# Patient Record
Sex: Female | Born: 1960
Health system: Southern US, Community
[De-identification: ages and names within clinical notes are randomized; demographics above are authoritative.]

## PROBLEM LIST (undated history)

## (undated) DIAGNOSIS — F329 Major depressive disorder, single episode, unspecified: Secondary | ICD-10-CM

## (undated) DIAGNOSIS — G4730 Sleep apnea, unspecified: Secondary | ICD-10-CM

## (undated) DIAGNOSIS — L03119 Cellulitis of unspecified part of limb: Secondary | ICD-10-CM

## (undated) DIAGNOSIS — E785 Hyperlipidemia, unspecified: Secondary | ICD-10-CM

## (undated) DIAGNOSIS — IMO0001 Reserved for inherently not codable concepts without codable children: Secondary | ICD-10-CM

## (undated) DIAGNOSIS — I10 Essential (primary) hypertension: Secondary | ICD-10-CM

## (undated) DIAGNOSIS — G43909 Migraine, unspecified, not intractable, without status migrainosus: Secondary | ICD-10-CM

## (undated) DIAGNOSIS — Z9981 Dependence on supplemental oxygen: Secondary | ICD-10-CM

## (undated) DIAGNOSIS — E119 Type 2 diabetes mellitus without complications: Secondary | ICD-10-CM

## (undated) DIAGNOSIS — M545 Low back pain, unspecified: Secondary | ICD-10-CM

## (undated) DIAGNOSIS — M797 Fibromyalgia: Secondary | ICD-10-CM

## (undated) DIAGNOSIS — J449 Chronic obstructive pulmonary disease, unspecified: Secondary | ICD-10-CM

## (undated) DIAGNOSIS — G629 Polyneuropathy, unspecified: Secondary | ICD-10-CM

## (undated) DIAGNOSIS — I509 Heart failure, unspecified: Secondary | ICD-10-CM

## (undated) DIAGNOSIS — R609 Edema, unspecified: Secondary | ICD-10-CM

## (undated) DIAGNOSIS — R51 Headache: Secondary | ICD-10-CM

## (undated) DIAGNOSIS — M1990 Unspecified osteoarthritis, unspecified site: Secondary | ICD-10-CM

## (undated) DIAGNOSIS — R519 Headache, unspecified: Secondary | ICD-10-CM

## (undated) DIAGNOSIS — R600 Localized edema: Secondary | ICD-10-CM

## (undated) DIAGNOSIS — A419 Sepsis, unspecified organism: Secondary | ICD-10-CM

## (undated) DIAGNOSIS — F32A Depression, unspecified: Secondary | ICD-10-CM

## (undated) DIAGNOSIS — G8929 Other chronic pain: Secondary | ICD-10-CM

## (undated) DIAGNOSIS — L89899 Pressure ulcer of other site, unspecified stage: Secondary | ICD-10-CM

## (undated) DIAGNOSIS — L02419 Cutaneous abscess of limb, unspecified: Secondary | ICD-10-CM

## (undated) DIAGNOSIS — R413 Other amnesia: Secondary | ICD-10-CM

## (undated) HISTORY — DX: Essential (primary) hypertension: I10

## (undated) HISTORY — DX: Other amnesia: R41.3

## (undated) HISTORY — DX: Pressure ulcer of other site, unspecified stage: L89.899

## (undated) HISTORY — DX: Edema, unspecified: R60.9

## (undated) HISTORY — DX: Localized edema: R60.0

## (undated) HISTORY — PX: HERNIA REPAIR: SHX51

## (undated) HISTORY — DX: Polyneuropathy, unspecified: G62.9

## (undated) HISTORY — DX: Morbid (severe) obesity due to excess calories: E66.01

## (undated) HISTORY — DX: Unspecified osteoarthritis, unspecified site: M19.90

## (undated) HISTORY — DX: Reserved for inherently not codable concepts without codable children: IMO0001

---

## 1992-12-16 HISTORY — PX: TOTAL ABDOMINAL HYSTERECTOMY: SHX209

## 1993-12-16 HISTORY — PX: APPENDECTOMY: SHX54

## 1993-12-16 HISTORY — PX: CHOLECYSTECTOMY OPEN: SUR202

## 2000-12-16 HISTORY — PX: ABDOMINAL HERNIA REPAIR: SHX539

## 2001-02-17 ENCOUNTER — Encounter: Payer: Self-pay | Admitting: Emergency Medicine

## 2001-02-17 ENCOUNTER — Emergency Department (HOSPITAL_COMMUNITY): Admission: EM | Admit: 2001-02-17 | Discharge: 2001-02-17 | Payer: Self-pay | Admitting: Emergency Medicine

## 2001-03-06 ENCOUNTER — Ambulatory Visit (HOSPITAL_COMMUNITY): Admission: RE | Admit: 2001-03-06 | Discharge: 2001-03-06 | Payer: Self-pay | Admitting: Emergency Medicine

## 2001-03-06 ENCOUNTER — Encounter: Payer: Self-pay | Admitting: Emergency Medicine

## 2001-03-26 ENCOUNTER — Encounter: Payer: Self-pay | Admitting: Surgery

## 2001-03-26 ENCOUNTER — Ambulatory Visit (HOSPITAL_COMMUNITY): Admission: RE | Admit: 2001-03-26 | Discharge: 2001-03-26 | Payer: Self-pay | Admitting: Surgery

## 2001-07-08 ENCOUNTER — Encounter: Payer: Self-pay | Admitting: Surgery

## 2001-07-14 ENCOUNTER — Inpatient Hospital Stay (HOSPITAL_COMMUNITY): Admission: RE | Admit: 2001-07-14 | Discharge: 2001-07-16 | Payer: Self-pay | Admitting: Surgery

## 2001-09-01 ENCOUNTER — Ambulatory Visit (HOSPITAL_COMMUNITY): Admission: RE | Admit: 2001-09-01 | Discharge: 2001-09-01 | Payer: Self-pay | Admitting: Surgery

## 2001-09-01 ENCOUNTER — Encounter: Payer: Self-pay | Admitting: Surgery

## 2001-10-20 ENCOUNTER — Encounter: Payer: Self-pay | Admitting: Emergency Medicine

## 2001-10-20 ENCOUNTER — Emergency Department (HOSPITAL_COMMUNITY): Admission: EM | Admit: 2001-10-20 | Discharge: 2001-10-20 | Payer: Self-pay | Admitting: Emergency Medicine

## 2001-11-23 ENCOUNTER — Encounter: Admission: RE | Admit: 2001-11-23 | Discharge: 2002-01-21 | Payer: Self-pay | Admitting: Sports Medicine

## 2001-12-17 ENCOUNTER — Encounter: Admission: RE | Admit: 2001-12-17 | Discharge: 2001-12-17 | Payer: Self-pay | Admitting: Family Medicine

## 2002-01-21 ENCOUNTER — Encounter: Admission: RE | Admit: 2002-01-21 | Discharge: 2002-01-21 | Payer: Self-pay | Admitting: Family Medicine

## 2002-01-22 ENCOUNTER — Encounter: Admission: RE | Admit: 2002-01-22 | Discharge: 2002-01-22 | Payer: Self-pay | Admitting: Family Medicine

## 2002-01-27 ENCOUNTER — Encounter: Admission: RE | Admit: 2002-01-27 | Discharge: 2002-01-27 | Payer: Self-pay | Admitting: Family Medicine

## 2002-02-09 ENCOUNTER — Encounter: Admission: RE | Admit: 2002-02-09 | Discharge: 2002-05-10 | Payer: Self-pay | Admitting: *Deleted

## 2002-02-24 ENCOUNTER — Other Ambulatory Visit: Admission: RE | Admit: 2002-02-24 | Discharge: 2002-02-24 | Payer: Self-pay | Admitting: Family Medicine

## 2002-02-26 ENCOUNTER — Other Ambulatory Visit: Admission: RE | Admit: 2002-02-26 | Discharge: 2002-02-26 | Payer: Self-pay | Admitting: Family Medicine

## 2002-02-26 ENCOUNTER — Encounter: Admission: RE | Admit: 2002-02-26 | Discharge: 2002-02-26 | Payer: Self-pay | Admitting: Family Medicine

## 2002-02-26 ENCOUNTER — Encounter (INDEPENDENT_AMBULATORY_CARE_PROVIDER_SITE_OTHER): Payer: Self-pay | Admitting: Specialist

## 2002-04-01 ENCOUNTER — Other Ambulatory Visit: Admission: RE | Admit: 2002-04-01 | Discharge: 2002-04-01 | Payer: Self-pay | Admitting: Family Medicine

## 2002-04-01 ENCOUNTER — Encounter (INDEPENDENT_AMBULATORY_CARE_PROVIDER_SITE_OTHER): Payer: Self-pay | Admitting: *Deleted

## 2002-04-01 ENCOUNTER — Encounter: Admission: RE | Admit: 2002-04-01 | Discharge: 2002-04-01 | Payer: Self-pay | Admitting: Sports Medicine

## 2002-04-15 ENCOUNTER — Ambulatory Visit (HOSPITAL_COMMUNITY): Admission: RE | Admit: 2002-04-15 | Discharge: 2002-04-15 | Payer: Self-pay | Admitting: Family Medicine

## 2002-04-15 ENCOUNTER — Encounter: Admission: RE | Admit: 2002-04-15 | Discharge: 2002-04-15 | Payer: Self-pay | Admitting: Family Medicine

## 2002-04-27 ENCOUNTER — Encounter: Admission: RE | Admit: 2002-04-27 | Discharge: 2002-04-27 | Payer: Self-pay | Admitting: Family Medicine

## 2002-05-12 ENCOUNTER — Ambulatory Visit (HOSPITAL_COMMUNITY): Admission: RE | Admit: 2002-05-12 | Discharge: 2002-05-12 | Payer: Self-pay | Admitting: Obstetrics & Gynecology

## 2002-05-12 ENCOUNTER — Encounter (INDEPENDENT_AMBULATORY_CARE_PROVIDER_SITE_OTHER): Payer: Self-pay

## 2002-05-26 ENCOUNTER — Encounter: Admission: RE | Admit: 2002-05-26 | Discharge: 2002-05-26 | Payer: Self-pay | Admitting: Family Medicine

## 2002-06-29 ENCOUNTER — Encounter: Admission: RE | Admit: 2002-06-29 | Discharge: 2002-06-29 | Payer: Self-pay | Admitting: Family Medicine

## 2002-08-04 ENCOUNTER — Encounter: Admission: RE | Admit: 2002-08-04 | Discharge: 2002-08-04 | Payer: Self-pay | Admitting: Family Medicine

## 2002-08-23 ENCOUNTER — Encounter: Admission: RE | Admit: 2002-08-23 | Discharge: 2002-09-14 | Payer: Self-pay

## 2002-08-26 ENCOUNTER — Other Ambulatory Visit: Admission: RE | Admit: 2002-08-26 | Discharge: 2002-08-26 | Payer: Self-pay | Admitting: Obstetrics & Gynecology

## 2002-09-01 ENCOUNTER — Encounter: Admission: RE | Admit: 2002-09-01 | Discharge: 2002-09-01 | Payer: Self-pay | Admitting: Family Medicine

## 2002-09-21 ENCOUNTER — Encounter: Admission: RE | Admit: 2002-09-21 | Discharge: 2002-12-20 | Payer: Self-pay

## 2002-10-01 ENCOUNTER — Encounter: Admission: RE | Admit: 2002-10-01 | Discharge: 2002-10-01 | Payer: Self-pay | Admitting: Family Medicine

## 2002-10-08 ENCOUNTER — Encounter: Admission: RE | Admit: 2002-10-08 | Discharge: 2002-10-08 | Payer: Self-pay | Admitting: Family Medicine

## 2002-10-11 ENCOUNTER — Encounter: Admission: RE | Admit: 2002-10-11 | Discharge: 2003-01-09 | Payer: Self-pay

## 2002-10-18 ENCOUNTER — Encounter: Admission: RE | Admit: 2002-10-18 | Discharge: 2002-10-18 | Payer: Self-pay | Admitting: Family Medicine

## 2002-10-23 ENCOUNTER — Emergency Department (HOSPITAL_COMMUNITY): Admission: EM | Admit: 2002-10-23 | Discharge: 2002-10-24 | Payer: Self-pay | Admitting: Emergency Medicine

## 2002-10-24 ENCOUNTER — Encounter: Payer: Self-pay | Admitting: Emergency Medicine

## 2002-11-01 ENCOUNTER — Encounter: Admission: RE | Admit: 2002-11-01 | Discharge: 2002-11-01 | Payer: Self-pay | Admitting: Family Medicine

## 2002-11-30 ENCOUNTER — Encounter: Admission: RE | Admit: 2002-11-30 | Discharge: 2002-11-30 | Payer: Self-pay | Admitting: Family Medicine

## 2002-12-23 ENCOUNTER — Encounter: Admission: RE | Admit: 2002-12-23 | Discharge: 2003-03-23 | Payer: Self-pay

## 2003-01-05 ENCOUNTER — Encounter: Admission: RE | Admit: 2003-01-05 | Discharge: 2003-01-05 | Payer: Self-pay | Admitting: Family Medicine

## 2003-02-03 ENCOUNTER — Encounter: Admission: RE | Admit: 2003-02-03 | Discharge: 2003-02-03 | Payer: Self-pay | Admitting: Family Medicine

## 2003-03-21 ENCOUNTER — Encounter: Admission: RE | Admit: 2003-03-21 | Discharge: 2003-06-19 | Payer: Self-pay

## 2003-04-11 ENCOUNTER — Encounter: Admission: RE | Admit: 2003-04-11 | Discharge: 2003-04-11 | Payer: Self-pay | Admitting: Family Medicine

## 2003-05-13 ENCOUNTER — Encounter: Admission: RE | Admit: 2003-05-13 | Discharge: 2003-05-13 | Payer: Self-pay | Admitting: Family Medicine

## 2003-05-23 ENCOUNTER — Ambulatory Visit (HOSPITAL_COMMUNITY): Admission: RE | Admit: 2003-05-23 | Discharge: 2003-05-23 | Payer: Self-pay | Admitting: Family Medicine

## 2003-05-23 ENCOUNTER — Encounter: Payer: Self-pay | Admitting: Family Medicine

## 2003-06-07 ENCOUNTER — Encounter: Admission: RE | Admit: 2003-06-07 | Discharge: 2003-06-07 | Payer: Self-pay | Admitting: Sports Medicine

## 2003-06-27 ENCOUNTER — Encounter
Admission: RE | Admit: 2003-06-27 | Discharge: 2003-09-25 | Payer: Self-pay | Admitting: Physical Medicine & Rehabilitation

## 2003-07-08 ENCOUNTER — Encounter: Admission: RE | Admit: 2003-07-08 | Discharge: 2003-07-08 | Payer: Self-pay | Admitting: Family Medicine

## 2003-08-25 ENCOUNTER — Encounter: Admission: RE | Admit: 2003-08-25 | Discharge: 2003-08-25 | Payer: Self-pay | Admitting: Family Medicine

## 2003-09-27 ENCOUNTER — Encounter: Admission: RE | Admit: 2003-09-27 | Discharge: 2003-09-27 | Payer: Self-pay | Admitting: Sports Medicine

## 2003-11-16 ENCOUNTER — Encounter (INDEPENDENT_AMBULATORY_CARE_PROVIDER_SITE_OTHER): Payer: Self-pay | Admitting: *Deleted

## 2003-11-18 ENCOUNTER — Encounter
Admission: RE | Admit: 2003-11-18 | Discharge: 2004-02-16 | Payer: Self-pay | Admitting: Physical Medicine & Rehabilitation

## 2003-12-08 ENCOUNTER — Encounter (INDEPENDENT_AMBULATORY_CARE_PROVIDER_SITE_OTHER): Payer: Self-pay | Admitting: Specialist

## 2003-12-08 ENCOUNTER — Encounter: Admission: RE | Admit: 2003-12-08 | Discharge: 2003-12-08 | Payer: Self-pay | Admitting: Sports Medicine

## 2004-01-17 ENCOUNTER — Encounter: Admission: RE | Admit: 2004-01-17 | Discharge: 2004-01-17 | Payer: Self-pay | Admitting: Orthopedic Surgery

## 2004-01-20 ENCOUNTER — Encounter: Admission: RE | Admit: 2004-01-20 | Discharge: 2004-01-20 | Payer: Self-pay | Admitting: Sports Medicine

## 2004-02-09 ENCOUNTER — Encounter: Admission: RE | Admit: 2004-02-09 | Discharge: 2004-02-09 | Payer: Self-pay | Admitting: Family Medicine

## 2004-02-20 ENCOUNTER — Encounter
Admission: RE | Admit: 2004-02-20 | Discharge: 2004-05-20 | Payer: Self-pay | Admitting: Physical Medicine & Rehabilitation

## 2004-04-10 ENCOUNTER — Encounter: Admission: RE | Admit: 2004-04-10 | Discharge: 2004-04-10 | Payer: Self-pay | Admitting: Sports Medicine

## 2004-06-04 ENCOUNTER — Encounter: Admission: RE | Admit: 2004-06-04 | Discharge: 2004-06-04 | Payer: Self-pay | Admitting: Family Medicine

## 2004-08-09 ENCOUNTER — Encounter: Admission: RE | Admit: 2004-08-09 | Discharge: 2004-08-09 | Payer: Self-pay | Admitting: Family Medicine

## 2004-11-07 ENCOUNTER — Ambulatory Visit: Payer: Self-pay | Admitting: Family Medicine

## 2005-01-08 ENCOUNTER — Ambulatory Visit: Payer: Self-pay | Admitting: Family Medicine

## 2005-02-28 ENCOUNTER — Encounter: Admission: RE | Admit: 2005-02-28 | Discharge: 2005-02-28 | Payer: Self-pay | Admitting: Sports Medicine

## 2005-03-12 ENCOUNTER — Ambulatory Visit: Payer: Self-pay | Admitting: Family Medicine

## 2005-05-06 ENCOUNTER — Ambulatory Visit: Payer: Self-pay | Admitting: Family Medicine

## 2005-05-30 DIAGNOSIS — IMO0001 Reserved for inherently not codable concepts without codable children: Secondary | ICD-10-CM

## 2005-05-30 HISTORY — DX: Reserved for inherently not codable concepts without codable children: IMO0001

## 2005-07-05 ENCOUNTER — Ambulatory Visit: Payer: Self-pay | Admitting: Family Medicine

## 2005-08-08 ENCOUNTER — Ambulatory Visit: Payer: Self-pay | Admitting: Family Medicine

## 2005-09-12 ENCOUNTER — Ambulatory Visit: Payer: Self-pay | Admitting: Family Medicine

## 2006-01-21 ENCOUNTER — Ambulatory Visit: Payer: Self-pay | Admitting: Sports Medicine

## 2006-03-27 ENCOUNTER — Ambulatory Visit: Payer: Self-pay | Admitting: Family Medicine

## 2006-04-29 ENCOUNTER — Encounter: Admission: RE | Admit: 2006-04-29 | Discharge: 2006-04-29 | Payer: Self-pay | Admitting: Sports Medicine

## 2006-05-08 ENCOUNTER — Ambulatory Visit: Payer: Self-pay | Admitting: Sports Medicine

## 2006-05-19 ENCOUNTER — Ambulatory Visit: Payer: Self-pay | Admitting: Family Medicine

## 2006-05-19 ENCOUNTER — Encounter: Admission: RE | Admit: 2006-05-19 | Discharge: 2006-05-19 | Payer: Self-pay | Admitting: Family Medicine

## 2006-07-07 ENCOUNTER — Ambulatory Visit: Payer: Self-pay | Admitting: Family Medicine

## 2006-10-20 ENCOUNTER — Ambulatory Visit: Payer: Self-pay | Admitting: Sports Medicine

## 2006-10-22 ENCOUNTER — Ambulatory Visit: Payer: Self-pay | Admitting: Family Medicine

## 2007-01-26 ENCOUNTER — Ambulatory Visit: Payer: Self-pay | Admitting: Family Medicine

## 2007-02-12 DIAGNOSIS — F339 Major depressive disorder, recurrent, unspecified: Secondary | ICD-10-CM | POA: Insufficient documentation

## 2007-02-12 DIAGNOSIS — I10 Essential (primary) hypertension: Secondary | ICD-10-CM

## 2007-02-12 DIAGNOSIS — F410 Panic disorder [episodic paroxysmal anxiety] without agoraphobia: Secondary | ICD-10-CM | POA: Insufficient documentation

## 2007-02-12 HISTORY — DX: Essential (primary) hypertension: I10

## 2007-02-13 ENCOUNTER — Encounter (INDEPENDENT_AMBULATORY_CARE_PROVIDER_SITE_OTHER): Payer: Self-pay | Admitting: *Deleted

## 2007-04-06 ENCOUNTER — Telehealth (INDEPENDENT_AMBULATORY_CARE_PROVIDER_SITE_OTHER): Payer: Self-pay | Admitting: *Deleted

## 2007-04-27 ENCOUNTER — Ambulatory Visit: Payer: Self-pay | Admitting: Sports Medicine

## 2007-05-01 ENCOUNTER — Telehealth: Payer: Self-pay | Admitting: Family Medicine

## 2007-08-04 ENCOUNTER — Ambulatory Visit: Payer: Self-pay | Admitting: Family Medicine

## 2007-11-16 ENCOUNTER — Encounter: Admission: RE | Admit: 2007-11-16 | Discharge: 2007-11-16 | Payer: Self-pay | Admitting: Sports Medicine

## 2007-11-16 ENCOUNTER — Encounter: Payer: Self-pay | Admitting: Family Medicine

## 2007-11-16 ENCOUNTER — Ambulatory Visit: Payer: Self-pay | Admitting: Family Medicine

## 2007-11-16 DIAGNOSIS — Z6841 Body Mass Index (BMI) 40.0 and over, adult: Secondary | ICD-10-CM

## 2007-11-18 ENCOUNTER — Encounter: Payer: Self-pay | Admitting: Family Medicine

## 2007-11-23 ENCOUNTER — Telehealth: Payer: Self-pay | Admitting: Family Medicine

## 2007-11-24 ENCOUNTER — Encounter (INDEPENDENT_AMBULATORY_CARE_PROVIDER_SITE_OTHER): Payer: Self-pay | Admitting: Family Medicine

## 2007-11-27 ENCOUNTER — Telehealth (INDEPENDENT_AMBULATORY_CARE_PROVIDER_SITE_OTHER): Payer: Self-pay | Admitting: Family Medicine

## 2007-12-14 ENCOUNTER — Encounter: Payer: Self-pay | Admitting: Family Medicine

## 2008-01-04 ENCOUNTER — Telehealth: Payer: Self-pay | Admitting: Family Medicine

## 2008-01-06 ENCOUNTER — Encounter: Payer: Self-pay | Admitting: Family Medicine

## 2008-01-12 ENCOUNTER — Encounter: Payer: Self-pay | Admitting: Family Medicine

## 2008-02-01 ENCOUNTER — Telehealth: Payer: Self-pay | Admitting: Family Medicine

## 2008-02-02 ENCOUNTER — Encounter: Payer: Self-pay | Admitting: *Deleted

## 2008-02-11 ENCOUNTER — Telehealth: Payer: Self-pay | Admitting: Family Medicine

## 2008-02-16 ENCOUNTER — Telehealth (INDEPENDENT_AMBULATORY_CARE_PROVIDER_SITE_OTHER): Payer: Self-pay | Admitting: Family Medicine

## 2008-02-17 ENCOUNTER — Encounter: Payer: Self-pay | Admitting: Family Medicine

## 2008-02-25 ENCOUNTER — Encounter: Payer: Self-pay | Admitting: Family Medicine

## 2008-03-03 ENCOUNTER — Encounter: Payer: Self-pay | Admitting: Family Medicine

## 2008-03-08 ENCOUNTER — Ambulatory Visit: Payer: Self-pay | Admitting: Family Medicine

## 2008-03-08 ENCOUNTER — Encounter: Payer: Self-pay | Admitting: *Deleted

## 2008-03-10 ENCOUNTER — Telehealth: Payer: Self-pay | Admitting: Family Medicine

## 2008-06-27 ENCOUNTER — Ambulatory Visit: Payer: Self-pay | Admitting: Family Medicine

## 2008-06-27 DIAGNOSIS — R609 Edema, unspecified: Secondary | ICD-10-CM

## 2008-06-29 ENCOUNTER — Telehealth: Payer: Self-pay | Admitting: Family Medicine

## 2008-08-17 ENCOUNTER — Telehealth: Payer: Self-pay | Admitting: Family Medicine

## 2008-08-19 ENCOUNTER — Encounter: Payer: Self-pay | Admitting: Family Medicine

## 2008-08-30 ENCOUNTER — Encounter: Payer: Self-pay | Admitting: Family Medicine

## 2008-09-08 ENCOUNTER — Telehealth: Payer: Self-pay | Admitting: Family Medicine

## 2008-09-22 ENCOUNTER — Ambulatory Visit: Payer: Self-pay | Admitting: Family Medicine

## 2008-09-22 ENCOUNTER — Encounter: Payer: Self-pay | Admitting: Family Medicine

## 2008-09-22 LAB — CONVERTED CEMR LAB
Albumin: 4.1 g/dL (ref 3.5–5.2)
Alkaline Phosphatase: 83 units/L (ref 39–117)
BUN: 19 mg/dL (ref 6–23)
CO2: 29 meq/L (ref 19–32)
Glucose, Bld: 105 mg/dL — ABNORMAL HIGH (ref 70–99)
Total Bilirubin: 0.3 mg/dL (ref 0.3–1.2)

## 2008-10-04 ENCOUNTER — Encounter: Payer: Self-pay | Admitting: Family Medicine

## 2009-01-03 ENCOUNTER — Telehealth: Payer: Self-pay | Admitting: *Deleted

## 2009-01-19 ENCOUNTER — Telehealth: Payer: Self-pay | Admitting: Family Medicine

## 2009-01-31 ENCOUNTER — Telehealth: Payer: Self-pay | Admitting: Family Medicine

## 2009-01-31 ENCOUNTER — Encounter: Payer: Self-pay | Admitting: Family Medicine

## 2009-02-01 ENCOUNTER — Encounter: Payer: Self-pay | Admitting: Family Medicine

## 2009-04-04 ENCOUNTER — Ambulatory Visit: Payer: Self-pay | Admitting: Family Medicine

## 2009-04-04 ENCOUNTER — Encounter: Payer: Self-pay | Admitting: Family Medicine

## 2009-04-04 LAB — CONVERTED CEMR LAB
AST: 14 units/L (ref 0–37)
Alkaline Phosphatase: 90 units/L (ref 39–117)
Calcium: 10.1 mg/dL (ref 8.4–10.5)
Glucose, Bld: 89 mg/dL (ref 70–99)
Potassium: 4.1 meq/L (ref 3.5–5.3)
Sodium: 140 meq/L (ref 135–145)

## 2009-04-07 ENCOUNTER — Encounter: Payer: Self-pay | Admitting: Family Medicine

## 2009-04-07 DIAGNOSIS — E785 Hyperlipidemia, unspecified: Secondary | ICD-10-CM | POA: Insufficient documentation

## 2009-04-24 ENCOUNTER — Encounter: Payer: Self-pay | Admitting: Family Medicine

## 2009-04-24 ENCOUNTER — Ambulatory Visit: Payer: Self-pay | Admitting: Family Medicine

## 2009-08-22 ENCOUNTER — Ambulatory Visit: Payer: Self-pay | Admitting: Family Medicine

## 2009-08-22 DIAGNOSIS — K5909 Other constipation: Secondary | ICD-10-CM

## 2009-08-24 ENCOUNTER — Telehealth: Payer: Self-pay | Admitting: Family Medicine

## 2009-09-05 ENCOUNTER — Telehealth: Payer: Self-pay | Admitting: Family Medicine

## 2009-09-25 ENCOUNTER — Telehealth: Payer: Self-pay | Admitting: Family Medicine

## 2009-09-26 ENCOUNTER — Encounter: Payer: Self-pay | Admitting: Family Medicine

## 2009-10-11 ENCOUNTER — Telehealth: Payer: Self-pay | Admitting: Family Medicine

## 2009-10-13 ENCOUNTER — Telehealth: Payer: Self-pay | Admitting: Family Medicine

## 2009-10-16 ENCOUNTER — Encounter: Payer: Self-pay | Admitting: Family Medicine

## 2009-10-17 ENCOUNTER — Telehealth: Payer: Self-pay | Admitting: Family Medicine

## 2009-10-23 ENCOUNTER — Telehealth: Payer: Self-pay | Admitting: Family Medicine

## 2009-10-27 ENCOUNTER — Telehealth: Payer: Self-pay | Admitting: Family Medicine

## 2009-10-31 ENCOUNTER — Ambulatory Visit: Payer: Self-pay | Admitting: Family Medicine

## 2009-10-31 ENCOUNTER — Encounter: Payer: Self-pay | Admitting: Family Medicine

## 2009-10-31 DIAGNOSIS — L538 Other specified erythematous conditions: Secondary | ICD-10-CM | POA: Insufficient documentation

## 2009-11-01 LAB — CONVERTED CEMR LAB
Alkaline Phosphatase: 79 units/L (ref 39–117)
BUN: 37 mg/dL — ABNORMAL HIGH (ref 6–23)
Basophils Absolute: 0 10*3/uL (ref 0.0–0.1)
Creatinine, Ser: 1.36 mg/dL — ABNORMAL HIGH (ref 0.40–1.20)
Eosinophils Relative: 0 % (ref 0–5)
Hemoglobin: 12.3 g/dL (ref 12.0–15.0)
Lymphocytes Relative: 24 % (ref 12–46)
MCHC: 32 g/dL (ref 30.0–36.0)
Magnesium: 2.2 mg/dL (ref 1.5–2.5)
Neutrophils Relative %: 70 % (ref 43–77)
Platelets: 254 10*3/uL (ref 150–400)
Potassium: 5.4 meq/L — ABNORMAL HIGH (ref 3.5–5.3)
RDW: 15 % (ref 11.5–15.5)
TSH: 1.738 microintl units/mL (ref 0.350–4.500)

## 2009-11-16 ENCOUNTER — Encounter: Payer: Self-pay | Admitting: Family Medicine

## 2009-11-20 ENCOUNTER — Telehealth: Payer: Self-pay | Admitting: Family Medicine

## 2009-12-21 ENCOUNTER — Encounter: Payer: Self-pay | Admitting: Family Medicine

## 2010-01-08 ENCOUNTER — Ambulatory Visit: Payer: Self-pay | Admitting: Internal Medicine

## 2010-01-08 ENCOUNTER — Other Ambulatory Visit: Payer: Self-pay | Admitting: Emergency Medicine

## 2010-01-08 ENCOUNTER — Inpatient Hospital Stay (HOSPITAL_COMMUNITY): Admission: EM | Admit: 2010-01-08 | Discharge: 2010-01-12 | Payer: Self-pay | Admitting: Family Medicine

## 2010-01-08 ENCOUNTER — Ambulatory Visit: Payer: Self-pay | Admitting: Family Medicine

## 2010-01-08 ENCOUNTER — Encounter: Payer: Self-pay | Admitting: Family Medicine

## 2010-01-19 ENCOUNTER — Encounter: Payer: Self-pay | Admitting: Family Medicine

## 2010-01-19 ENCOUNTER — Ambulatory Visit: Payer: Self-pay | Admitting: Family Medicine

## 2010-01-19 LAB — CONVERTED CEMR LAB
AST: 11 units/L (ref 0–37)
Albumin: 3.7 g/dL (ref 3.5–5.2)
Alkaline Phosphatase: 80 units/L (ref 39–117)
BUN: 11 mg/dL (ref 6–23)
Creatinine, Ser: 1.07 mg/dL (ref 0.40–1.20)
Glucose, Bld: 82 mg/dL (ref 70–99)
HCT: 38 % (ref 36.0–46.0)
Hemoglobin: 12 g/dL (ref 12.0–15.0)
MCHC: 31.6 g/dL (ref 30.0–36.0)
MCV: 95.7 fL (ref 78.0–100.0)
RBC: 3.97 M/uL (ref 3.87–5.11)
RDW: 14.5 % (ref 11.5–15.5)
Total Protein: 6.3 g/dL (ref 6.0–8.3)

## 2010-01-22 ENCOUNTER — Telehealth: Payer: Self-pay | Admitting: Family Medicine

## 2010-01-22 ENCOUNTER — Encounter: Payer: Self-pay | Admitting: Family Medicine

## 2010-01-23 ENCOUNTER — Telehealth: Payer: Self-pay | Admitting: Family Medicine

## 2010-01-25 ENCOUNTER — Telehealth: Payer: Self-pay | Admitting: Family Medicine

## 2010-01-29 ENCOUNTER — Encounter: Payer: Self-pay | Admitting: Family Medicine

## 2010-02-05 ENCOUNTER — Ambulatory Visit: Payer: Self-pay | Admitting: Family Medicine

## 2010-02-06 ENCOUNTER — Telehealth: Payer: Self-pay | Admitting: *Deleted

## 2010-02-08 ENCOUNTER — Telehealth: Payer: Self-pay | Admitting: Family Medicine

## 2010-02-09 ENCOUNTER — Telehealth: Payer: Self-pay | Admitting: Family Medicine

## 2010-02-13 ENCOUNTER — Ambulatory Visit: Payer: Self-pay | Admitting: Family Medicine

## 2010-03-06 ENCOUNTER — Ambulatory Visit: Payer: Self-pay | Admitting: Family Medicine

## 2010-03-21 ENCOUNTER — Telehealth: Payer: Self-pay | Admitting: Family Medicine

## 2010-05-08 ENCOUNTER — Telehealth: Payer: Self-pay | Admitting: Family Medicine

## 2010-05-22 ENCOUNTER — Ambulatory Visit: Payer: Self-pay | Admitting: Family Medicine

## 2010-07-25 ENCOUNTER — Telehealth: Payer: Self-pay | Admitting: Family Medicine

## 2010-08-08 ENCOUNTER — Telehealth: Payer: Self-pay | Admitting: Family Medicine

## 2010-08-09 ENCOUNTER — Telehealth: Payer: Self-pay | Admitting: Family Medicine

## 2010-09-17 ENCOUNTER — Telehealth: Payer: Self-pay | Admitting: Family Medicine

## 2010-10-25 ENCOUNTER — Encounter: Payer: Self-pay | Admitting: Family Medicine

## 2010-10-25 DIAGNOSIS — G43909 Migraine, unspecified, not intractable, without status migrainosus: Secondary | ICD-10-CM | POA: Insufficient documentation

## 2010-10-29 ENCOUNTER — Telehealth: Payer: Self-pay | Admitting: Family Medicine

## 2010-11-01 ENCOUNTER — Telehealth: Payer: Self-pay | Admitting: Family Medicine

## 2010-11-02 ENCOUNTER — Telehealth: Payer: Self-pay | Admitting: Family Medicine

## 2010-12-12 ENCOUNTER — Telehealth: Payer: Self-pay | Admitting: *Deleted

## 2011-01-13 LAB — CONVERTED CEMR LAB
Albumin: 4.1 g/dL (ref 3.5–5.2)
Alkaline Phosphatase: 89 units/L (ref 39–117)
Alpha-2-Globulin: 13.7 % — ABNORMAL HIGH (ref 7.1–11.8)
BUN: 19 mg/dL (ref 6–23)
Beta Globulin: 7.8 % — ABNORMAL HIGH (ref 4.7–7.2)
CO2: 26 meq/L (ref 19–32)
Calcium: 10.2 mg/dL (ref 8.4–10.5)
Chloride: 99 meq/L (ref 96–112)
Cholesterol: 176 mg/dL (ref 0–200)
Glucose, Bld: 97 mg/dL (ref 70–99)
HCV Ab: NEGATIVE
IgG (Immunoglobin G), Serum: 1260 mg/dL (ref 694–1618)
LDL Cholesterol: 64 mg/dL (ref 0–99)
Potassium: 3.9 meq/L (ref 3.5–5.3)
Total Protein, Serum Electrophoresis: 7.4 g/dL (ref 6.0–8.3)
Triglycerides: 307 mg/dL — ABNORMAL HIGH (ref <150)
VLDL: 61 mg/dL — ABNORMAL HIGH (ref 0–40)
Vitamin B-12: 254 pg/mL (ref 211–911)

## 2011-01-15 ENCOUNTER — Ambulatory Visit: Admit: 2011-01-15 | Payer: Self-pay | Admitting: Family Medicine

## 2011-01-16 ENCOUNTER — Telehealth: Payer: Self-pay | Admitting: Family Medicine

## 2011-01-16 ENCOUNTER — Encounter: Payer: Self-pay | Admitting: *Deleted

## 2011-01-17 NOTE — Assessment & Plan Note (Signed)
Summary: skin recheck, page Kathleen Roy, [phone] to see/ls   Primary Care Provider:  Luretha Murphy NP   History of Present Illness: Patient came in today for follow up skin check.  Now on Bactrim DS 2 tabs two times a day for 2 weeks, this is week #2.  Attempted to get PA from her Medicare Part D plan to add Suprax as that was recommended by ID at the time of discharge from the hospital.  She has a cellulitis related sepsis and that included an ICU stay.  She is unable to do her skin check as her pannus is very large and she cannot see.  She will purchase a mirror to use for inspection.  Allergies: No Known Drug Allergies  Physical Exam  General:  Morbildy obese, unable to wear shoes, in bedroom booties. Skin:  Massive abdominal pannus, hanging between legs.  Skin with peal of an orange appearance, and firm L side greater than right.  She has several excoriated areas, large skin tags, and minimal reddness.  As compaired to last visit, is was better, definitely no worse.   Impression & Recommendations:  Problem # 1:  INFECTION, SKIN AND SOFT TISSUE (ICD-686.9)  Complete course of Bactrim, inspect skin with a hand held mirror daily, report any reddness, chills, or dizziness when standing.  Orders: [facility]- Est Level  2 ([address])  Complete Medication List: 1)  Effexor Xr 150 Mg Cp24 (Venlafaxine hcl) .... Take 2 capsule by mouth once a day 2)  Lamictal 150 Mg Tabs (Lamotrigine) .... Take 1 tablet by mouth once a day 3)  Trazodone Hcl 300 Mg Tabs (Trazodone hcl) .... Take 1 tablet at bedtime 4)  Simvastatin 20 Mg Tabs (Simvastatin) .... One daily 5)  Lisinopril 10 Mg Tabs (Lisinopril) .... One daily 6)  Vistaril 50 Mg Caps (Hydroxyzine pamoate) .... 2 qhs 7)  Tramadol Hcl 50 Mg Tabs (Tramadol hcl) .... 2 qid as needed pain 8)  Lyrica 75 Mg Caps (Pregabalin) .... One by mouth bid 9)  Demadex 20 Mg Tabs (Torsemide) .... Three tablets daily 10)  Peri-colace 8.6-50 Mg Tabs  (Sennosides-docusate sodium) 11)  Zaroxolyn 5 Mg Tabs (Metolazone) .... Hold 12)  Spiriva Handihaler 18 Mcg Caps (Tiotropium bromide monohydrate) .... One puff daily 13)  Midrin 325-65-100 Mg Caps (Apap-isometheptene-dichloral) .... One at onset of ha, may repeat in one hour if needed 14)  Triamcinolone Acetonide 0.1 % Crea (Triamcinolone acetonide) .... Apply to affected areas daily, 160 gm 15)  Klor-con M20 20 Meq Cr-tabs (Potassium chloride crys cr) .... Take two times daily every other day and three times daily on daus you takezaroxlyn 16)  Cleocin-t 1 % Lotn (Clindamycin phosphate) .... Apply to rash on back of legs daily, qs for one month 17)  Fluconazole 100 Mg Tabs (Fluconazole) .... 2 tabs daily for one week, repeat every month for 6 months 18)  Nystatin 100000 Unit/gm Powd (Nystatin) .... Apply two times a day to skin folds, very large amount for adult with extensive intertirgo. 19)  Senokot 8.6 Mg Tabs (Sennosides) 20)  Mupirocin 2 % Oint (Mupirocin) .... Apply to open sores two times a day until healed. 21)  Carisoprodol 350 Mg Tabs (Carisoprodol) .... One two times a day as needed muscle spasm 22)  3 in One Commode Chair  .... Dysmobility 23)  Cane  .... Gait abnormality 24)  Bactrim Ds 800-160 Mg Tabs (Sulfamethoxazole-trimethoprim) .... 2 tabs two times a day for 2 weeks 25)  Hydrocodone-acetaminophen 5-500 Mg Tabs (  Hydrocodone-acetaminophen) .... One two times a day as needed for severe pain  Patient Instructions: 1)  Report increase in reddness, dizziness when standing or chills.

## 2011-01-17 NOTE — Miscellaneous (Signed)
  Clinical Lists Changes  Medications: Removed medication of CLOTRIMAZOLE-BETAMETHASONE 1-0.05 % CREA (CLOTRIMAZOLE-BETAMETHASONE) use under skin folds daily, may use with Nystatin powder.  80 GM tube.

## 2011-01-17 NOTE — Progress Notes (Signed)
Summary: change statin  Phone Note Call from Patient Call back at Home Phone [phone]   Caller: Patient Summary of Call: Got letter stating that after this refill on her Simvastin she could not get anymore.  Would have to appeal or change to something else.  Can Kathleen Roy call her concerning this. Initial call taken by: Clydell Hakim,  March 21, 2010 3:04 PM    New/Updated Medications: PRAVACHOL 40 MG TABS (PRAVASTATIN SODIUM) one qhs Prescriptions: PRAVACHOL 40 MG TABS (PRAVASTATIN SODIUM) one qhs  #90 x 3   Entered and Authorized by:   Kathleen Murphy NP   Signed by:   Kathleen Murphy NP on 03/21/2010   Method used:   Electronically to        [facility] [phone]* (retail)       [address]       [place], [place]  [address]       Ph: [phone]       Fax: [phone]   RxID:   [id number]

## 2011-01-17 NOTE — Progress Notes (Signed)
Summary: Returning call  Phone Note Call from Patient Call back at Home Phone [phone]   Reason for Call: Talk to Nurse Summary of Call: returning Lynn's call Initial call taken by: Knox Royalty,  December 12, 2010 2:20 PM  Follow-up for Phone Call        called to advise patient that she needs appointment . she has appointment scheduled for 12/18/2009. Follow-up by: Theresia Lo RN,  December 12, 2010 3:55 PM

## 2011-01-17 NOTE — Progress Notes (Signed)
Summary: cxl   Phone Note Call from Patient Call back at Home Phone [phone]   Caller: Patient Summary of Call: pt is having a hard time getting info for [facility] and had to cxl appt for today - will hope to get resolved this week.  she is very sorry. Initial call taken by: De Nurse,  September 17, 2010 12:31 PM

## 2011-01-17 NOTE — Progress Notes (Signed)
Summary: phn msg  Phone Note Call from Patient Call back at Home Phone [phone]   Caller: Patient Summary of Call: please call pt when PA has been approved and sent to pharmacy Initial call taken by: De Nurse,  February 08, 2010 2:55 PM  Follow-up for Phone Call        [facility] was called again, the PA form was re-faxed, they had not processed it.  I wrote at the top of the form to 'expidite" the process. I called her pharmacy and they will be looking for the PA to come through.  All along she remains on Bactrim DS 2 tabs two times a day, she knows to seek emergent attention if she becomes ill. Follow-up by: Luretha Murphy NP,  February 08, 2010 5:02 PM

## 2011-01-17 NOTE — Progress Notes (Signed)
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  Cultured postitive in the past and and ICU admission after becoming septic. Bactrim DS 2 two times a day for 2 weeks; bactroban nasal, hibiclens body wash twic weekely.   Patient instructed to come in if she needs I&D, the lesions are large, come to a head, and drain pus. Patient is very close to being home bound from morbid obesity. Follow-up by: Luretha Murphy NP,  July 26, 2010 10:19 AM    New/Updated Medications: BACTRIM DS 800-160 MG TABS (SULFAMETHOXAZOLE-TRIMETHOPRIM) 2 tabs two times a day for 14 days BACTROBAN 2 % OINT (MUPIROCIN) apply to cotton swab and lightly swab inside of nostrils after washing daily for 2 weeks. HIBICLENS 4 % LIQD (CHLORHEXIDINE GLUCONATE) (OTC) wash twice weekly from head to toe Prescriptions: HIBICLENS 4 % LIQD (CHLORHEXIDINE GLUCONATE) (OTC) wash twice weekly from head to toe  #1 x 0   Entered and Authorized by:   Luretha Murphy NP   Signed by:   Luretha Murphy NP on 07/26/2010   Method used:   Electronically to        [facility] [phone]* (retail)       [address]       [place], [place]  [address]       Ph: [phone]       Fax: [phone]   RxID:   [id number] BACTROBAN 2 % OINT (MUPIROCIN) apply to cotton swab and lightly swab inside of nostrils after washing daily for 2 weeks.  #1 x 0   Entered and Authorized by:   Luretha Murphy NP   Signed by:   Luretha Murphy NP on 07/26/2010   Method used:   Electronically to        [facility] [address] [phone]* (retail)       [address]       [place], [place]  [address]       Ph: [phone]  Fax: [phone]   RxID:   [id number] BACTRIM DS 800-160 MG TABS (SULFAMETHOXAZOLE-TRIMETHOPRIM) 2 tabs two times a day for 14 days  #56 x 0   Entered and Authorized by:   Luretha Murphy NP   Signed by:   Luretha Murphy NP on 07/26/2010   Method used:   Electronically to        [facility] [phone]* (retail)       [address]       [place], [place]  [address]       Ph: [phone]       Fax: [phone]   RxID:   [id number]

## 2011-01-17 NOTE — Progress Notes (Signed)
Summary: meds prob/PA FAXED/TS  Phone Note Call from Patient Call back at Home Phone [phone]   Caller: Patient Summary of Call: was seen yesterday - Suprax - Insurance doesn't pay for it unless we do a prior auth - [employer] - phone - [phone] will pay for 6 every 86 days Initial call taken by: De Nurse,  February 06, 2010 11:51 AM  Follow-up for Phone Call        do you want to change drug or try for prior auth? Follow-up by: Golden Circle RN,  February 07, 2010 12:23 PM  Additional Follow-up for Phone Call Additional follow up Details #1::        PA completed and faxed to [facility] [place].  Patient feels that reddness is unchanged, cannot get here until 3/1 for recheck.  Instructed that at the earliest sign of worsening to go to [facility] ER, she voiced understanding. Additional Follow-up by: Luretha Murphy NP,  February 07, 2010 2:15 PM    Additional Follow-up for Phone Call Additional follow up Details #2::    PA FORM FAXED. FILLED OUT BY S.[facility] [phone] Follow-up by: Arlyss Repress CMA,,  February 07, 2010 5:29 PM

## 2011-01-17 NOTE — Progress Notes (Signed)
Summary: meds prob  Phone Note Refill Request Call back at Home Phone [phone] Message from:  Patient  Refills Requested: Medication #1:  EPIDRIN 325-65-100 MG CAPS as directed   Notes: pharmacy states this is no longer being made - pls advise as to what else she can take. needs qty 10  Initial call taken by: De Nurse,  November 01, 2010 3:12 PM  Follow-up for Phone Call        This class of meds have been eliminated. Follow-up by: Luretha Murphy NP,  November 01, 2010 7:34 PM

## 2011-01-17 NOTE — Miscellaneous (Signed)
  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

## 2011-01-17 NOTE — Miscellaneous (Signed)
  Clinical Lists Changes  Medications: Added new medication of CLOTRIMAZOLE-BETAMETHASONE 1-0.05 % CREA (CLOTRIMAZOLE-BETAMETHASONE) use under skin folds daily, may use with Nystatin powder.  80 GM tube. - Signed Rx of CLOTRIMAZOLE-BETAMETHASONE 1-0.05 % CREA (CLOTRIMAZOLE-BETAMETHASONE) use under skin folds daily, may use with Nystatin powder.  80 GM tube.;  #1 x 6;  Signed;  Entered by: Luretha Murphy NP;  Authorized by: Luretha Murphy NP;  Method used: Electronically to [facility] [phone]*, [address] [place], [place]  [address], Ph: [phone], Fax: [phone]    Prescriptions: CLOTRIMAZOLE-BETAMETHASONE 1-0.05 % CREA (CLOTRIMAZOLE-BETAMETHASONE) use under skin folds daily, may use with Nystatin powder.  80 GM tube.  #1 x 6   Entered and Authorized by:   Luretha Murphy NP   Signed by:   Luretha Murphy NP on 01/22/2010   Method used:   Electronically to        [facility] [phone]* (retail)       [address]       [place], [place]  [address]       Ph: [phone]       Fax: [phone]   RxID:   [id number]

## 2011-01-17 NOTE — Progress Notes (Signed)
Summary: phn msg  Phone Note Call from Patient Call back at Home Phone [phone]   Caller: Patient Summary of Call: Calling Kathleen Roy about places on her legs. Initial call taken by: Clydell Hakim,  August 08, 2010 3:25 PM  Follow-up for Phone Call        One area resolved, another area blistered and opened.  Less than an inch in diameter.  Instructed on self care and to come in if not responding.  Antiboitic ointment and coverage with non-adhearing pad. Follow-up by: Luretha Murphy NP,  August 09, 2010 9:23 AM

## 2011-01-17 NOTE — Assessment & Plan Note (Signed)
Summary: h/fup,tcb   Vital Signs:  Patient profile:   50 year old female Height:      62.5 inches Weight:      355.4 pounds BMI:     64.20 Pulse rate:   84 / minute BP sitting:   131 / 72  (right arm)  Vitals Entered By: Renato Battles slade,cma CC: hospital f/up. discuss change of meds. edema. Is Patient Diabetic? No Pain Assessment Patient in pain? yes     Location: back Intensity: 5 Onset of pain  Chronic   Primary Care Provider:  Luretha Murphy NP  CC:  hospital f/up. discuss change of meds. edema..  History of Present Illness: Here for hospital follow up after a bout of sepsis secondary to cellulitis of abdominal pannus.  Has been off antihypertensives and dieuretics.  She feels that she is putting on a lot of fluid as she cannot wear her shoes.  She would like to restart her 'fluid pills'.  Otherwise she is happy to be home, she did not want to go to rehab in [place] after acute hospitalization.  Missed her cats.  Denies fever, chills, energy at baseling.  Has a few sores on her abdomen.  She admits to being a picker.  Habits & Providers  Alcohol-Tobacco-Diet     Tobacco Status: quit  Current Medications (verified): 1)  Effexor Xr 150 Mg Cp24 (Venlafaxine Hcl) .... Take 2 Capsule By Mouth Once A Day 2)  Lamictal 150 Mg Tabs (Lamotrigine) .... Take 1 Tablet By Mouth Once A Day 3)  Trazodone Hcl 300 Mg Tabs (Trazodone Hcl) .... Take 1 Tablet At Bedtime 4)  Simvastatin 20 Mg Tabs (Simvastatin) .... One Daily 5)  Lisinopril 10 Mg  Tabs (Lisinopril) .... One Daily 6)  Vistaril 50 Mg  Caps (Hydroxyzine Pamoate) .... 2 Qhs 7)  Tramadol Hcl 50 Mg  Tabs (Tramadol Hcl) .... 2 Qid As Needed Pain 8)  Lyrica 75 Mg Caps (Pregabalin) .... One By Mouth Bid 9)  Demadex 20 Mg  Tabs (Torsemide) .... Three Tablets Daily 10)  Peri-Colace 8.6-50 Mg  Tabs (Sennosides-Docusate Sodium) 11)  Zaroxolyn 5 Mg Tabs (Metolazone) .... One As Needed For Severe Edema, Do Not Use More Than 3 Per Week 12)   Spiriva Handihaler 18 Mcg Caps (Tiotropium Bromide Monohydrate) .... One Puff Daily 13)  Midrin 325-65-100 Mg Caps (Apap-Isometheptene-Dichloral) .... One At Onset of Ha, May Repeat in One Hour If Needed 14)  Triamcinolone Acetonide 0.1 % Crea (Triamcinolone Acetonide) .... Apply To Affected Areas Daily, 160 Gm 15)  Klor-Con M20 20 Meq Cr-Tabs (Potassium Chloride Crys Cr) .... Take Two Times Daily Every Other Day and Three Times Daily On Daus You Takezaroxlyn 16)  Cleocin-T 1 % Lotn (Clindamycin Phosphate) .... Apply To Rash On Back of Legs Daily, Qs For One Month 17)  Fluconazole 100 Mg Tabs (Fluconazole) .... 2 Tabs Daily For One Week, Repeat Every Month For 6 Months 18)  Nystatin 100000 Unit/gm Powd (Nystatin) .... Apply Two Times A Day To Skin Folds, Very Large Amount For Adult With Extensive Intertirgo. 19)  Senokot 8.6 Mg Tabs (Sennosides) 20)  Zeasorb-Af 2 % Powd (Miconazole Nitrate) .... Apply To Skin Folds Two Times A Day, Large Size 21)  Mupirocin 2 % Oint (Mupirocin) .... Apply To Open Sores Two Times A Day Until Healed.  Allergies (verified): No Known Drug Allergies  Social History: Smoking Status:  quit  Physical Exam  General:  Good spirits, moving very slow. Lungs:  normal respiratory effort  and normal breath sounds.   Heart:  normal rate and regular rhythm.   Abdomen:  Large abdominal panus, few open areas, no drainage or reddness. Skin:  intertirgo mildly inflammed   Impression & Recommendations:  Problem # 1:  INFECTION, SKIN AND SOFT TISSUE (ICD-686.9) Completing course of antibiotics Orders: [facility] (04540) [facility]- Est  Level 4 ([address])  Problem # 2:  HYPOTENSION (ICD-458.9) Continue off anti-HTN meds, may begin dieuretic at 1/3 dose, may take 2 for first dose only to unload.  Will try to minimize use.  Patient is very dependent on her 'fluid pills'. Orders: [facility] (14782) [facility]- Est  Level 4 ([address])  Problem # 3:  INTERTRIGO, CANDIDAL (ICD-695.89) Will treat  topically and with pulse oral dose for now.  May have been site to break in skin integrity that caused severe cellulitis and secondary sepsis.  Problem # 4:  MORBID OBESITY (ICD-278.01) Major problem, do not see this changing.  She is in a cycle of sedentary life style and already many co-morbid conditions.  Will continue to encourage positive changes in choices.  She has quit smoking and changed to diet drinks.  Follow up in 3 weeks  Complete Medication List: 1)  Effexor Xr 150 Mg Cp24 (Venlafaxine hcl) .... Take 2 capsule by mouth once a day 2)  Lamictal 150 Mg Tabs (Lamotrigine) .... Take 1 tablet by mouth once a day 3)  Trazodone Hcl 300 Mg Tabs (Trazodone hcl) .... Take 1 tablet at bedtime 4)  Simvastatin 20 Mg Tabs (Simvastatin) .... One daily 5)  Lisinopril 10 Mg Tabs (Lisinopril) .... One daily 6)  Vistaril 50 Mg Caps (Hydroxyzine pamoate) .... 2 qhs 7)  Tramadol Hcl 50 Mg Tabs (Tramadol hcl) .... 2 qid as needed pain 8)  Lyrica 75 Mg Caps (Pregabalin) .... One by mouth bid 9)  Demadex 20 Mg Tabs (Torsemide) .... Three tablets daily 10)  Peri-colace 8.6-50 Mg Tabs (Sennosides-docusate sodium) 11)  Zaroxolyn 5 Mg Tabs (Metolazone) .... One as needed for severe edema, do not use more than 3 per week 12)  Spiriva Handihaler 18 Mcg Caps (Tiotropium bromide monohydrate) .... One puff daily 13)  Midrin 325-65-100 Mg Caps (Apap-isometheptene-dichloral) .... One at onset of ha, may repeat in one hour if needed 14)  Triamcinolone Acetonide 0.1 % Crea (Triamcinolone acetonide) .... Apply to affected areas daily, 160 gm 15)  Klor-con M20 20 Meq Cr-tabs (Potassium chloride crys cr) .... Take two times daily every other day and three times daily on daus you takezaroxlyn 16)  Cleocin-t 1 % Lotn (Clindamycin phosphate) .... Apply to rash on back of legs daily, qs for one month 17)  Fluconazole 100 Mg Tabs (Fluconazole) .... 2 tabs daily for one week, repeat every month for 6 months 18)  Nystatin  100000 Unit/gm Powd (Nystatin) .... Apply two times a day to skin folds, very large amount for adult with extensive intertirgo. 19)  Senokot 8.6 Mg Tabs (Sennosides) 20)  Zeasorb-af 2 % Powd (Miconazole nitrate) .... Apply to skin folds two times a day, large size 21)  Mupirocin 2 % Oint (Mupirocin) .... Apply to open sores two times a day until healed.  Other Orders: [facility] [phone])  Patient Instructions: 1)  Demadex 20 mg, may take 2 first dose, but then one daily 2)  Do not take Zaroxyln 3)  Use bactroban on sores until healing and sealed over.  Do not pick scabs. 4)  Please schedule a follow-up appointment in 3 months .  Prescriptions:  NYSTATIN 100000 UNIT/GM POWD (NYSTATIN) apply two times a day to skin folds, very large amount for adult with extensive intertirgo.  #4 x 6   Entered and Authorized by:   Luretha Murphy NP   Signed by:   Luretha Murphy NP on 01/22/2010   Method used:   Electronically to        [facility] [phone]* (retail)       [address]       [place], [place]  [address]       Ph: [phone]       Fax: [phone]   RxID:   [id number] DEMADEX 20 MG  TABS (TORSEMIDE) Three tablets daily  #90 x 3   Entered and Authorized by:   Luretha Murphy NP   Signed by:   Luretha Murphy NP on 01/19/2010   Method used:   Electronically to        [facility] [phone]* (retail)       [address]       [place], [place]  [address]       Ph: [phone]       Fax: [phone]   RxID:   [id number] MUPIROCIN 2 % OINT (MUPIROCIN) apply to open sores two times a day until healed.  #1 x 6   Entered and Authorized by:   Luretha Murphy NP   Signed by:   Luretha Murphy NP on 01/19/2010   Method used:   Electronically to        [facility] [phone]* (retail)       [address]       [place], [place]  [address]       Ph: [phone]       Fax: [phone]   RxID:   [id number]

## 2011-01-17 NOTE — Progress Notes (Signed)
Summary: pls call  Phone Note Call from Patient Call back at Home Phone [phone]   Caller: Patient Summary of Call: pt cannot come today b/c her knees are so bad and wants to talk to [facility] about this Initial call taken by: De Nurse,  May 08, 2010 12:30 PM  Follow-up for Phone Call        Too much pain to walk, discussed options.  May use the hydrocodone a few times a day Follow-up by: Luretha Murphy NP,  May 08, 2010 1:43 PM

## 2011-01-17 NOTE — Progress Notes (Signed)
Summary: Rx Req/medical equipment  Phone Note Other Incoming Call back at [phone]   Caller: [facility] Summary of Call: Would like to get order for a cane and 3in1 commode chair.   Initial call taken by: Clydell Hakim,  January 23, 2010 11:55 AM  Follow-up for Phone Call        will forward to Luretha Murphy. 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       [facility] [phone]* (retail)       [address]       [place], [place]  [address]       Ph: [phone]       Fax: [phone]   RxID:   [id number] 3 IN ONE COMMODE CHAIR dysmobility  #1 x 0   Entered and Authorized by:   Luretha Murphy NP   Signed by:   Luretha Murphy NP on 01/26/2010   Method used:   Printed then faxed to ...       [facility]  [address] [phone]* (retail)       [address]       [place], [place]  [address]       Ph: [phone]       Fax: [phone]   RxID:   [id number]

## 2011-01-17 NOTE — Progress Notes (Signed)
  Phone Note Call from Patient   Caller: Brother-Don Call For: [phone] Summary of Call: Called and said patient is having bad diarrhea and unable to make appt.  Need to talk with Keiona Jenison regarding other med issues. Initial call taken by: Abundio Miu,  October 29, 2010 1:37 PM  Follow-up for Phone Call        called, having a hard time getting out of the house because of intermittent diarrhea. She is scared to leave.  Stopped her laxative. Only eats once per day. Will need to come in next month. Follow-up by: Luretha Murphy NP,  October 29, 2010 4:43 PM   New Allergies: ! ASPIRIN (ASPIRIN) New/Updated Medications: EPIDRIN 325-65-100 MG CAPS (APAP-ISOMETHEPTENE-DICHLORAL) as directed New Allergies: ! ASPIRIN (ASPIRIN)

## 2011-01-17 NOTE — Assessment & Plan Note (Signed)
Summary: F/UP,TCB   Vital Signs:  Patient profile:   50 year old female Height:      62.5 inches Weight:      358 pounds BMI:     64.67 Temp:     98.5 degrees F oral Pulse rate:   93 / minute BP sitting:   116 / 68  (right arm)  Vitals Entered By: Tessie Fass CMA (February 05, 2010 4:38 PM)  Nutrition Counseling: Patient's BMI is greater than 25 and therefore counseled on weight management options. CC: F/U Is Patient Diabetic? No Pain Assessment Patient in pain? yes     Location: lower back, knees Intensity: 7   Primary Care Provider:  Luretha Murphy NP  CC:  F/U.  History of Present Illness: PIcked at another scab on her abdomen and has noticed reddness of her abdominal pannus, recent admission for cellulitis and associated bacteremia.  Was discharged on Suprax and Bactrim by mouth, completed course one week ago.  This has been a chronic problem for months.  She cannot stop picking at herself, she endorses OCP and is being treated with a number of psychotropics for such.  She discussed with mental health provider who felt that she needed behavioral therapy but did not recommend a provider, did refill her chronic meds.  Has very little money for anything, brings in a SSD check, and between rent, utilities, meds, and meds and food for her cats she only has 60$ per week for food.  She is trying to get food stamps.  She buys [employer] frozen dinners and eats one per day.  Drinks 1 gallon of chocolate milk per day.  She quit smoking as she could no longer afford to purchase.  She has switched from regular soda to diet, and drinks a 6 pack per day.  She cannot walk any distance because of her morbid obesity and pain in neck and knees.  She feels that the tramadol is only marginally effective.  We discussed increasing pain med to hydrocodone as needed if it would increase her activity.  She is willing to try, but fears constipation.  Habits & Providers  Alcohol-Tobacco-Diet  Tobacco Status: quit  Current Medications (verified): 1)  Effexor Xr 150 Mg Cp24 (Venlafaxine Hcl) .... Take 2 Capsule By Mouth Once A Day 2)  Lamictal 150 Mg Tabs (Lamotrigine) .... Take 1 Tablet By Mouth Once A Day 3)  Trazodone Hcl 300 Mg Tabs (Trazodone Hcl) .... Take 1 Tablet At Bedtime 4)  Simvastatin 20 Mg Tabs (Simvastatin) .... One Daily 5)  Lisinopril 10 Mg  Tabs (Lisinopril) .... One Daily 6)  Vistaril 50 Mg  Caps (Hydroxyzine Pamoate) .... 2 Qhs 7)  Tramadol Hcl 50 Mg  Tabs (Tramadol Hcl) .... 2 Qid As Needed Pain 8)  Lyrica 75 Mg Caps (Pregabalin) .... One By Mouth Bid 9)  Demadex 20 Mg  Tabs (Torsemide) .... Three Tablets Daily 10)  Peri-Colace 8.6-50 Mg  Tabs (Sennosides-Docusate Sodium) 11)  Zaroxolyn 5 Mg Tabs (Metolazone) .... Hold 12)  Spiriva Handihaler 18 Mcg Caps (Tiotropium Bromide Monohydrate) .... One Puff Daily 13)  Midrin 325-65-100 Mg Caps (Apap-Isometheptene-Dichloral) .... One At Onset of Ha, May Repeat in One Hour If Needed 14)  Triamcinolone Acetonide 0.1 % Crea (Triamcinolone Acetonide) .... Apply To Affected Areas Daily, 160 Gm 15)  Klor-Con M20 20 Meq Cr-Tabs (Potassium Chloride Crys Cr) .... Take Two Times Daily Every Other Day and Three Times Daily On Daus You Takezaroxlyn 16)  Cleocin-T 1 %  Lotn (Clindamycin Phosphate) .... Apply To Rash On Back of Legs Daily, Qs For One Month 17)  Fluconazole 100 Mg Tabs (Fluconazole) .... 2 Tabs Daily For One Week, Repeat Every Month For 6 Months 18)  Nystatin 100000 Unit/gm Powd (Nystatin) .... Apply Two Times A Day To Skin Folds, Very Large Amount For Adult With Extensive Intertirgo. 19)  Senokot 8.6 Mg Tabs (Sennosides) 20)  Mupirocin 2 % Oint (Mupirocin) .... Apply To Open Sores Two Times A Day Until Healed. 21)  Carisoprodol 350 Mg Tabs (Carisoprodol) .... One Two Times A Day As Needed Muscle Spasm 22)  3 in One Commode Chair .... Dysmobility 23)  Cane .... Gait Abnormality 24)  Bactrim Ds 800-160 Mg Tabs  (Sulfamethoxazole-Trimethoprim) .... 2 Tabs Two Times A Day For 2 Weeks 25)  Hydrocodone-Acetaminophen 5-500 Mg Tabs (Hydrocodone-Acetaminophen) .... One Two Times A Day As Needed For Severe Pain 26)  Suprax 400 Mg Tabs (Cefixime) .... One Tab Daily For 1 Week 27)  Suprax 400 Mg Tabs (Cefixime) .... One Tab Daily For 7 Days  Allergies (verified): No Known Drug Allergies  Review of Systems      See HPI General:  Denies chills, fever, and sweats. Derm:  Complains of changes in color of skin.  Physical Exam  General:  Morbidly obese, depressed affect, alert. Neck:  decreased ROM Lungs:  clear, dyspnea with walking short distances. Heart:  normal rate and regular rhythm.   Abdomen:  Very large, firm pannus with multiple sore and reddness extennding into the subcu tissue, with a peau d'orange appearance. Skin:  see abdominal exam   Impression & Recommendations:  Problem # 1:  INFECTION, SKIN AND SOFT TISSUE (ICD-686.9)  Resume Suprax 400 mg daily for one week  and Bactrim DS 2 tabs two times a day for 2 week.  Instructed to stop picking, use bactroban and cover with bandaid.  If not turning around in one week or still appearing inflammed or if she develops a fever she is to return.  Orders: [facility]- Est  Level 4 ([address])  Problem # 2:  HYPOTENSION (ICD-458.9)  continues off antihypertensives since hospitalization, gradually increasing dieuretic  Orders: [facility]- Est  Level 4 ([address])  Problem # 3:  INTERTRIGO, CANDIDAL (ICD-695.89)  Nystatin powder seems to be most effective after trials of multiple creams and OTC products.  With chronic antibiotics does take oral antifungals in burts monthly  Orders: [facility]- Est  Level 4 ([address])  Problem # 4:  EDEMA, CHRONIC (ICD-782.3) Still holding metolazone, up to 60 mg of torsemide daily.  This causes her to be incontinent but she prefers to edema that interferes with her abiltliy to stand on her feel. Her updated medication list for this problem  includes:    Demadex 20 Mg Tabs (Torsemide) .Marland Kitchen... Three tablets daily    Zaroxolyn 5 Mg Tabs (Metolazone) ..... Hold  Complete Medication List: 1)  Effexor Xr 150 Mg Cp24 (Venlafaxine hcl) .... Take 2 capsule by mouth once a day 2)  Lamictal 150 Mg Tabs (Lamotrigine) .... Take 1 tablet by mouth once a day 3)  Trazodone Hcl 300 Mg Tabs (Trazodone hcl) .... Take 1 tablet at bedtime 4)  Simvastatin 20 Mg Tabs (Simvastatin) .... One daily 5)  Lisinopril 10 Mg Tabs (Lisinopril) .... One daily 6)  Vistaril 50 Mg Caps (Hydroxyzine pamoate) .... 2 qhs 7)  Tramadol Hcl 50 Mg Tabs (Tramadol hcl) .... 2 qid as needed pain 8)  Lyrica 75 Mg Caps (Pregabalin) .... One by  mouth bid 9)  Demadex 20 Mg Tabs (Torsemide) .... Three tablets daily 10)  Peri-colace 8.6-50 Mg Tabs (Sennosides-docusate sodium) 11)  Zaroxolyn 5 Mg Tabs (Metolazone) .... Hold 12)  Spiriva Handihaler 18 Mcg Caps (Tiotropium bromide monohydrate) .... One puff daily 13)  Midrin 325-65-100 Mg Caps (Apap-isometheptene-dichloral) .... One at onset of ha, may repeat in one hour if needed 14)  Triamcinolone Acetonide 0.1 % Crea (Triamcinolone acetonide) .... Apply to affected areas daily, 160 gm 15)  Klor-con M20 20 Meq Cr-tabs (Potassium chloride crys cr) .... Take two times daily every other day and three times daily on daus you takezaroxlyn 16)  Cleocin-t 1 % Lotn (Clindamycin phosphate) .... Apply to rash on back of legs daily, qs for one month 17)  Fluconazole 100 Mg Tabs (Fluconazole) .... 2 tabs daily for one week, repeat every month for 6 months 18)  Nystatin 100000 Unit/gm Powd (Nystatin) .... Apply two times a day to skin folds, very large amount for adult with extensive intertirgo. 19)  Senokot 8.6 Mg Tabs (Sennosides) 20)  Mupirocin 2 % Oint (Mupirocin) .... Apply to open sores two times a day until healed. 21)  Carisoprodol 350 Mg Tabs (Carisoprodol) .... One two times a day as needed muscle spasm 22)  3 in One Commode Chair   .... Dysmobility 23)  Cane  .... Gait abnormality 24)  Bactrim Ds 800-160 Mg Tabs (Sulfamethoxazole-trimethoprim) .... 2 tabs two times a day for 2 weeks 25)  Hydrocodone-acetaminophen 5-500 Mg Tabs (Hydrocodone-acetaminophen) .... One two times a day as needed for severe pain 26)  Suprax 400 Mg Tabs (Cefixime) .... One tab daily for 1 week  Patient Instructions: 1)  Call in one week if reddness is not improving 2)  may use hydrocodone in place of tramadol for severe pain  3)  Hold BP meds 4)  Increase torsamide to 3 tabs daily Prescriptions: SUPRAX 400 MG TABS (CEFIXIME) one tab daily for 7 days  #7 x 0   Entered and Authorized by:   Luretha Murphy NP   Signed by:   Luretha Murphy NP on 02/05/2010   Method used:   Electronically to        [facility] [phone]* (retail)       [address]       [place], [place]  [address]       Ph: [phone]       Fax: [phone]   RxID:   [id number] SUPRAX 400 MG TABS (CEFIXIME) one tab daily for 1 week  #7 x 0   Entered and Authorized by:   Luretha Murphy NP   Signed by:   Luretha Murphy NP on 02/05/2010   Method used:   Printed then faxed to ...       [facility]  [address] [phone]* (retail)       [address]       [place], [place]  [address]       Ph: [phone]       Fax: [phone]   RxID:   [id number] HYDROCODONE-ACETAMINOPHEN 5-500 MG TABS (HYDROCODONE-ACETAMINOPHEN) one two times a day as needed for severe pain Brand medically necessary #60 x 0   Entered and Authorized by:   Luretha Murphy NP   Signed by:   Luretha Murphy NP on 02/05/2010   Method used:   Printed then faxed to ...       [facility]  [address] [phone]* (retail)       [phone]  [address]       [place], [place]  [address]       Ph: [phone]       Fax: [phone]   RxID:   [id number] BACTRIM DS 800-160 MG TABS (SULFAMETHOXAZOLE-TRIMETHOPRIM) 2 tabs two times a day for 2 weeks  #56 x 0   Entered and Authorized by:   Luretha Murphy NP   Signed by:   Luretha Murphy NP on  02/05/2010   Method used:   Electronically to        [facility] [phone]* (retail)       [address]       [place], [place]  [address]       Ph: [phone]       Fax: [phone]   RxID:   [id number] CANE gait abnormality  #1 x 0   Entered and Authorized by:   Luretha Murphy NP   Signed by:   Luretha Murphy NP on 02/05/2010   Method used:   Printed then faxed to ...       [facility]  [address] [phone]* (retail)       [address]       [place], [place]  [address]       Ph: [phone]       Fax: [phone]   RxID:   [id number] 3 IN ONE COMMODE CHAIR dysmobility  #1 x 0   Entered and Authorized by:   Luretha Murphy NP   Signed by:   Luretha Murphy NP on 02/05/2010   Method used:   Printed then faxed to ...       [facility]  [address] [phone]* (retail)       [address]       [place], [place]  [address]       Ph: [phone]       Fax: [phone]   RxID:   [id number]    Prevention & Chronic Care Immunizations   Influenza vaccine: Not documented    Tetanus booster: 06/15/2002: Done.   Tetanus booster due: 06/15/2012    Pneumococcal vaccine: Not documented  Other Screening   Pap smear: Done.  (11/16/2003)   Pap smear action/deferral: Not indicated S/P hysterectomy  (08/22/2009)   Pap smear due: 11/15/2004    Mammogram: Done.  (02/13/2005)   Mammogram action/deferral: Refused  (08/22/2009)   Mammogram due: 02/13/2006   Smoking status: quit  (02/05/2010)  Lipids   Total Cholesterol: 176  (11/16/2007)   LDL: 64  (11/16/2007)   LDL Direct: Not documented   HDL: 51  (11/16/2007)   Triglycerides: 307  (11/16/2007)   Lipid panel due: 11/21/2009    SGOT (AST): 11  (01/19/2010)   SGPT (ALT): 17  (01/19/2010)   Alkaline phosphatase: 80  (01/19/2010)   Total bilirubin: 0.3  (01/19/2010)    Lipid flowsheet reviewed?: Yes   Progress toward LDL goal: At goal  Hypertension   Last Blood Pressure: 116 / 68  (02/05/2010)   Serum creatinine: 1.07   (01/19/2010)   Serum potassium 4.9  (01/19/2010)    Hypertension flowsheet reviewed?: Yes   Progress toward BP goal: At goal  Self-Management Support :   Personal Goals (by the next clinic visit) :      Personal blood pressure goal: 130/80  (08/22/2009)     Personal LDL goal: 100  (08/22/2009)    Hypertension self-management support: Not documented    Lipid self-management support: Not documented

## 2011-01-17 NOTE — Miscellaneous (Signed)
Summary: PA for Midrin  Clinical Lists Changes  Problems: Removed problem of FATIGUE (ICD-780.79) Added new problem of MIGRAINE HEADACHE (ICD-346.90)  PA form completed for Midrin Luretha Murphy NP  October 25, 2010 4:31 PM

## 2011-01-17 NOTE — Progress Notes (Signed)
Summary: Rx Ques  Phone Note Call from Patient Call back at [facility] Phone [phone]   Caller: Patient Summary of Call: Pt wondering if she should stay on Bactrim.  Gust Brooms said that she might want her too.  Needs it called in today at [facility] [address] if she does want her to stay on it.   Initial call taken by: Clydell Hakim,  August 09, 2010 3:48 PM  Follow-up for Phone Call        will forward message to Luretha Murphy. Follow-up by: Theresia Lo RN,  August 09, 2010 3:51 PM    New/Updated Medications: DOXYCYCLINE HYCLATE 100 MG TABS (DOXYCYCLINE HYCLATE) one daily Prescriptions: DOXYCYCLINE HYCLATE 100 MG TABS (DOXYCYCLINE HYCLATE) one daily  #30 x 5   Entered and Authorized by:   Luretha Murphy NP   Signed by:   Luretha Murphy NP on 08/09/2010   Method used:   Electronically to        [facility] [phone]* (retail)       [address]       [place], [place]  [address]       Ph: [phone]       Fax: [phone]   RxID:   [id number]

## 2011-01-17 NOTE — Progress Notes (Signed)
Summary: Rx Prob  Phone Note Call from Patient Call back at Home Phone [phone]   Caller: Patient Summary of Call: The Nystatin that she takes the insurance will only pay for 1 bottle a month.  She really needs about 10 bottles to be put on the rx for her insurance to cover this.  Not refills. Initial call taken by: Clydell Hakim,  January 22, 2010 11:55 AM  Follow-up for Phone Call         spoke with patient and she states one container of the powder will last her 3 days. called pharmacist and he states she received a 60 gm bottle.  will ask Luretha Murphy if she will ok more bottles per refill so it will last a month for patient. Follow-up by: Theresia Lo RN,  January 22, 2010 12:31 PM  Additional Follow-up for Phone Call Additional follow up Details #1::        see update, script was changed for 4 bottles each month, will have her add cream as well. Additional Follow-up by: Luretha Murphy NP,  January 22, 2010 5:12 PM

## 2011-01-17 NOTE — Miscellaneous (Signed)
  Clinical Lists Changes  Medications: Changed medication from LISINOPRIL 10 MG  TABS (LISINOPRIL) one daily to LISINOPRIL 10 MG  TABS (LISINOPRIL) one daily - Signed Rx of LISINOPRIL 10 MG  TABS (LISINOPRIL) one daily;  #30 x 11;  Signed;  Entered by: Luretha Murphy NP;  Authorized by: Luretha Murphy NP;  Method used: Electronically to [facility] [phone]*, [address] [place], [place]  [address], Ph: [phone], Fax: [phone]    Prescriptions: LISINOPRIL 10 MG  TABS (LISINOPRIL) one daily  #30 x 11   Entered and Authorized by:   Luretha Murphy NP   Signed by:   Luretha Murphy NP on 12/21/2009   Method used:   Electronically to        [facility] [phone]* (retail)       [address]       [place], [place]  [address]       Ph: [phone]       Fax: [phone]   RxID:   [id number]

## 2011-01-17 NOTE — Progress Notes (Signed)
Summary: Prior Authorization Problem  Phone Note Call from Patient Call back at Home Phone [phone]   Caller: Patient Summary of Call: Pt called her insurance co Rx [place] and they said that the Dr. office needed to call them due to some info being left off of the prior authorization.   [phone]  ID # [id number].  Can we let pt know when this has been done. Initial call taken by: Clydell Hakim,  February 09, 2010 4:10 PM  Follow-up for Phone Call        the drug is suprax. the denial form with questions is in Terrian Sentell's chart box for her to answer Follow-up by: Golden Circle RN,  February 09, 2010 4:29 PM  Additional Follow-up for Phone Call Additional follow up Details #1::        I was out of the office until today, patient is coming in tomorrow will await to examine her and decide if she needs another  antibiotic. Additional Follow-up by: Luretha Murphy NP,  February 12, 2010 8:19 AM

## 2011-01-17 NOTE — Progress Notes (Signed)
Summary: re: meds/ts  ---- Converted from flag ---- ---- 11/01/2010 7:36 PM, Luretha Murphy NP wrote: Please call Khyla and tell her that this class of meds to treat migrane is no longer available.  She should use the pain meds that she has.  We can discuss further when she comes in. ------------------------------ called pt and explained above message. pt said, that she is aware of this...but, the pharmacist supposed to let s.saxon know, WHICH meds can be used as a substitute. fwd. to s.saxon.Arlyss Repress CMA,  November 02, 2010 3:53 PM

## 2011-01-17 NOTE — Progress Notes (Signed)
Summary: phn msg  Phone Note Call from Patient Call back at Home Phone [phone]   Caller: Patient Summary of Call: needs to talk to Darl Pikes about things "going on" Initial call taken by: De Nurse,  January 25, 2010 11:55 AM  Follow-up for Phone Call        pt calling back again to talk to [place].  Don't call between 2 & 3 today. Follow-up by: Clydell Hakim,  January 26, 2010 11:14 AM  Additional Follow-up for Phone Call Additional follow up Details #1::        called, see orders Additional Follow-up by: Luretha Murphy NP,  January 26, 2010 1:28 PM    New/Updated Medications: CARISOPRODOL 350 MG TABS (CARISOPRODOL) one two times a day as needed muscle spasm Prescriptions: CARISOPRODOL 350 MG TABS (CARISOPRODOL) one two times a day as needed muscle spasm  #20 x 0   Entered and Authorized by:   Luretha Murphy NP   Signed by:   Luretha Murphy NP on 01/26/2010   Method used:   Electronically to        [facility] [phone]* (retail)       [address]       [place], [place]  [address]       Ph: [phone]       Fax: [phone]   RxID:   [id number] CLOTRIMAZOLE-BETAMETHASONE 1-0.05 % CREA (CLOTRIMAZOLE-BETAMETHASONE) use under skin folds daily, may use with Nystatin powder.  80 GM tube.  #1 x 6   Entered and Authorized by:   Luretha Murphy NP   Signed by:   Luretha Murphy NP on 01/26/2010   Method used:   Electronically to        [facility] [phone]* (retail)       [address]       [place], [place]  [address]       Ph: [phone]       Fax: [phone]   RxID:   [id number] NYSTATIN 100000 UNIT/GM POWD (NYSTATIN) apply two times a day to skin folds, very large amount for adult with extensive intertirgo.  #4 x 6   Entered and Authorized by:   Luretha Murphy NP   Signed by:   Luretha Murphy NP on 01/26/2010   Method used:   Electronically to        [facility] [phone]* (retail)       [address]       [place], [place]  [address]       Ph:  [phone]       Fax: [phone]   RxID:   [id number]

## 2011-01-17 NOTE — Assessment & Plan Note (Signed)
Summary: f/u,df   Vital Signs:  Patient profile:   50 year old female Height:      62.5 inches Weight:      339 pounds BMI:     61.24 Temp:     98.4 degrees F oral Pulse rate:   98 / minute BP sitting:   142 / 78  (left arm)  Vitals Entered By: Tessie Fass CMA (March 06, 2010 3:18 PM) CC: F/U BP, edema, cellulitis Is Patient Diabetic? No Pain Assessment Patient in pain? yes     Location: lower back, bilateral knees Intensity: 8   Primary Care Provider:  Luretha Murphy NP  CC:  F/U BP, edema, and cellulitis.  History of Present Illness: Clevie first wanted to point out her weight loss, 19 pounds.  She did take a zaroxlyn last week as her edema was severe.  She has not changed what she eats but has changed her  hours.  She use to sleep from 6 AM-5 PM, now she is on a 12-12 schedule and no longer eating and laying down.  She is active in her apartment going to the bathroom all day.  She spends her time on the internet, reading, and talking on the phone.  She has not noticed any changes in her skin, she has purchased a [employer] and is inspecting her panus daily.  She is still having problems with yeast and remains on fluconazole for one week every month.  She has had many courses of antibiotics to clear her cellulitis.   She denies chills or fever.  She has a painful area on the back of her leg, that hurts when she sits on that area.  There is no pain with standing or when lying down.  Habits & Providers  Alcohol-Tobacco-Diet     Tobacco Status: quit  Current Medications (verified): 1)  Effexor Xr 150 Mg Cp24 (Venlafaxine Hcl) .... Take 2 Capsule By Mouth Once A Day 2)  Lamictal 150 Mg Tabs (Lamotrigine) .... Take 1 Tablet By Mouth Once A Day 3)  Trazodone Hcl 300 Mg Tabs (Trazodone Hcl) .... Take 1 Tablet At Bedtime 4)  Simvastatin 20 Mg Tabs (Simvastatin) .... One Daily 5)  Lisinopril 10 Mg  Tabs (Lisinopril) .... One Daily 6)  Vistaril 50 Mg  Caps (Hydroxyzine Pamoate) .... 2  Qhs 7)  Tramadol Hcl 50 Mg  Tabs (Tramadol Hcl) .... 2 Qid As Needed Pain 8)  Lyrica 75 Mg Caps (Pregabalin) .... One By Mouth Bid 9)  Demadex 20 Mg  Tabs (Torsemide) .... Three Tablets Daily 10)  Peri-Colace 8.6-50 Mg  Tabs (Sennosides-Docusate Sodium) 11)  Zaroxolyn 5 Mg Tabs (Metolazone) .... Once Weekly and Prn 12)  Spiriva Handihaler 18 Mcg Caps (Tiotropium Bromide Monohydrate) .... One Puff Daily 13)  Midrin 325-65-100 Mg Caps (Apap-Isometheptene-Dichloral) .... One At Onset of Ha, May Repeat in One Hour If Needed 14)  Triamcinolone Acetonide 0.1 % Crea (Triamcinolone Acetonide) .... Apply To Affected Areas Daily, 160 Gm 15)  Klor-Con M20 20 Meq Cr-Tabs (Potassium Chloride Crys Cr) .... Take Two Times Daily Every Other Day and Three Times Daily On Daus You Takezaroxlyn 16)  Cleocin-T 1 % Lotn (Clindamycin Phosphate) .... Apply To Rash On Back of Legs Daily, Qs For One Month 17)  Fluconazole 100 Mg Tabs (Fluconazole) .... 2 Tabs Daily For One Week, Repeat Every Month For 6 Months 18)  Nystatin 100000 Unit/gm Powd (Nystatin) .... Apply Two Times A Day To Skin Folds, Very Large Amount For Adult  With Extensive Intertirgo. 19)  Senokot 8.6 Mg Tabs (Sennosides) 20)  Mupirocin 2 % Oint (Mupirocin) .... Apply To Open Sores Two Times A Day Until Healed. 21)  Carisoprodol 350 Mg Tabs (Carisoprodol) .... One Two Times A Day As Needed Muscle Spasm 22)  3 in One Commode Chair .... Dysmobility 23)  Cane .... Gait Abnormality 24)  Hydrocodone-Acetaminophen 5-500 Mg Tabs (Hydrocodone-Acetaminophen) .... One Two Times A Day As Needed For Severe Pain 25)  Ibuprofen 800 Mg Tabs (Ibuprofen) .... One Three Times A Day As Needed Pain 26)  Florastor 250 Mg Caps (Saccharomyces Boulardii) .... One Capsule Daily  Allergies (verified): No Known Drug Allergies  Review of Systems      See HPI  Physical Exam  General:  In good spirits, walked with a cane today and more briskly and more steady. Lungs:  normal  respiratory effort labored with walking to exam room but quickly recovered.  normal respiratory effort, normal breath sounds, no crackles, and no wheezes.   Heart:  normal rate, regular rhythm, and no murmur.   Abdomen:  Very large firm panus, peu'd orange appearance but non reddened, still firm, few large tags that are intack.  Back of leg with a palpable venous cord that was tender.   Impression & Recommendations:  Problem # 1:  SUPERFICIAL THROMBOPHLEBITIS (ICD-451.0)  NSAID for 3 days, heat, expect resolution.  Call for worsening.  Orders: [facility]- Est  Level 4 (81191)  Problem # 2:  INFECTION, SKIN AND SOFT TISSUE (ICD-686.9)  Resolved, still at risk.  Intertrigo open site for introduction of bacteria.  Orders: [facility]- Est  Level 4 (47829)  Problem # 3:  INTERTRIGO, CANDIDAL (ICD-695.89) Continue oral antifungal plan, add pro-biotic, hopefully we can keep her off antibiotics for a while Orders: [facility]- Est  Level 4 (56213)  Problem # 4:  HYPOTENSION (ICD-458.9)  resolved, resume lisinopril, and baseline dieuretic dosage  Orders: [facility]- Est  Level 4 (08657)  Problem # 5:  MORBID OBESITY (ICD-278.01)  19 pound weight loss in past month, yeah, now had food stamps and may be able to eat more healthy.  Orders: [facility]- Est  Level 4 (99214)  Complete Medication List: 1)  Effexor Xr 150 Mg Cp24 (Venlafaxine hcl) .... Take 2 capsule by mouth once a day 2)  Lamictal 150 Mg Tabs (Lamotrigine) .... Take 1 tablet by mouth once a day 3)  Trazodone Hcl 300 Mg Tabs (Trazodone hcl) .... Take 1 tablet at bedtime 4)  Simvastatin 20 Mg Tabs (Simvastatin) .... One daily 5)  Lisinopril 10 Mg Tabs (Lisinopril) .... One daily 6)  Vistaril 50 Mg Caps (Hydroxyzine pamoate) .... 2 qhs 7)  Tramadol Hcl 50 Mg Tabs (Tramadol hcl) .... 2 qid as needed pain 8)  Lyrica 75 Mg Caps (Pregabalin) .... One by mouth bid 9)  Demadex 20 Mg Tabs (Torsemide) .... Three tablets daily 10)  Peri-colace 8.6-50 Mg Tabs  (Sennosides-docusate sodium) 11)  Zaroxolyn 5 Mg Tabs (Metolazone) .... Once weekly and prn 12)  Spiriva Handihaler 18 Mcg Caps (Tiotropium bromide monohydrate) .... One puff daily 13)  Midrin 325-65-100 Mg Caps (Apap-isometheptene-dichloral) .... One at onset of ha, may repeat in one hour if needed 14)  Triamcinolone Acetonide 0.1 % Crea (Triamcinolone acetonide) .... Apply to affected areas daily, 160 gm 15)  Klor-con M20 20 Meq Cr-tabs (Potassium chloride crys cr) .... Take two times daily every other day and three times daily on daus you takezaroxlyn 16)  Cleocin-t 1 % Lotn (Clindamycin  phosphate) .... Apply to rash on back of legs daily, qs for one month 17)  Fluconazole 100 Mg Tabs (Fluconazole) .... 2 tabs daily for one week, repeat every month for 6 months 18)  Nystatin 100000 Unit/gm Powd (Nystatin) .... Apply two times a day to skin folds, very large amount for adult with extensive intertirgo. 19)  Senokot 8.6 Mg Tabs (Sennosides) 20)  Mupirocin 2 % Oint (Mupirocin) .... Apply to open sores two times a day until healed. 21)  Carisoprodol 350 Mg Tabs (Carisoprodol) .... One two times a day as needed muscle spasm 22)  3 in One Commode Chair  .... Dysmobility 23)  Cane  .... Gait abnormality 24)  Hydrocodone-acetaminophen 5-500 Mg Tabs (Hydrocodone-acetaminophen) .... One two times a day as needed for severe pain 25)  Ibuprofen 800 Mg Tabs (Ibuprofen) .... One three times a day as needed pain 26)  Florastor 250 Mg Caps (Saccharomyces boulardii) .... One capsule daily  Patient Instructions: 1)  Take Ibuprofen 800 mg two times a day for 3 days in addition to your usual pain meds.  2)  Never take more than 8 extra strength Tylenol in a day 3)  If you have severe pain may try 1/2 Vicodin instead of tramdol and tylenol. 4)  Please schedule a follow-up appointment in 2 months.  Prescriptions: FLORASTOR 250 MG CAPS (SACCHAROMYCES BOULARDII) one capsule daily  #30 x 6   Entered and Authorized  by:   Luretha Murphy NP   Signed by:   Luretha Murphy NP on 03/06/2010   Method used:   Electronically to        [facility] [phone]* (retail)       [address]       [place], [place]  [address]       Ph: [phone]       Fax: [phone]   RxID:   [id number]

## 2011-01-17 NOTE — Assessment & Plan Note (Signed)
Summary: f/up,tcb   Vital Signs:  Patient profile:   50 year old female Height:      62.5 inches Weight:      337 pounds BMI:     60.88 Temp:     98.1 degrees F oral Pulse rate:   105 / minute BP sitting:   91 / 56  (left arm) Cuff size:   large  Vitals Entered By: Tessie Fass CMA (May 22, 2010 4:31 PM) CC: F/U BP and edema Is Patient Diabetic? No Pain Assessment Patient in pain? yes     Location: lower back, bilateral knee Intensity: 8   Primary Care Provider:  Luretha Murphy NP  CC:  F/U BP and edema.  History of Present Illness: 3 months follow up visit, had to cancel last week as the family car would not run.  She describes a life of hardship and poverty.  Her brother who is her only family and local contact has been unemployed over one year, he lives in the family home and utilities will likely be turned off.  She is on a limited imcome and tries to help.    She has a scab on her panas that she has been picking, last time this happpened she ended up with cellulitis and sepsis that ended up in an ICU stay.  It took some time for it to resolve and she was on antibotics for a while after discharge.  Then she was plagued with yeast, she has completed 5 months, one week bursts of oral antifungal, she has one more refill.  She has not changed much about her eating habits and remains very sedentary.  Her weight is down to 337 from 339.  She asked about Cymbalta for her pain but she is already on Effexor XR 150 per mental health.  Habits & Providers  Alcohol-Tobacco-Diet     Tobacco Status: quit  Current Medications (verified): 1)  Effexor Xr 150 Mg Cp24 (Venlafaxine Hcl) .... Take 2 Capsule By Mouth Once A Day 2)  Lamictal 150 Mg Tabs (Lamotrigine) .... Take 1 Tablet By Mouth Once A Day 3)  Trazodone Hcl 300 Mg Tabs (Trazodone Hcl) .... Take 1 Tablet At Bedtime 4)  Lisinopril 10 Mg  Tabs (Lisinopril) .... 1/2tab Daily 5)  Vistaril 50 Mg  Caps (Hydroxyzine Pamoate) .... 2  Qhs 6)  Tramadol Hcl 50 Mg  Tabs (Tramadol Hcl) .... 2 Qid As Needed Pain 7)  Lyrica 75 Mg Caps (Pregabalin) .... One By Mouth Bid 8)  Demadex 20 Mg  Tabs (Torsemide) .... Three Tablets Daily 9)  Peri-Colace 8.6-50 Mg  Tabs (Sennosides-Docusate Sodium) 10)  Zaroxolyn 5 Mg Tabs (Metolazone) .... Once Weekly and Prn 11)  Spiriva Handihaler 18 Mcg Caps (Tiotropium Bromide Monohydrate) .... One Puff Daily 12)  Midrin 325-65-100 Mg Caps (Apap-Isometheptene-Dichloral) .... One At Onset of Ha, May Repeat in One Hour If Needed 13)  Triamcinolone Acetonide 0.1 % Crea (Triamcinolone Acetonide) .... Apply To Affected Areas Daily, 160 Gm 14)  Klor-Con M20 20 Meq Cr-Tabs (Potassium Chloride Crys Cr) .... Take Two Times Daily Every Other Day and Three Times Daily On Daus You Takezaroxlyn 15)  Nystatin 100000 Unit/gm Powd (Nystatin) .... Apply Two Times A Day To Skin Folds, Very Large Amount For Adult With Extensive Intertirgo. 16)  Senokot 8.6 Mg Tabs (Sennosides) 17)  Carisoprodol 350 Mg Tabs (Carisoprodol) .... One Two Times A Day As Needed Muscle Spasm 18)  3 in One Commode Chair .... Dysmobility 19)  Cane .... Gait Abnormality 20)  Ibuprofen 800 Mg Tabs (Ibuprofen) .... One Three Times A Day As Needed Pain 21)  Florastor 250 Mg Caps (Saccharomyces Boulardii) .... One Capsule Daily 22)  Pravachol 40 Mg Tabs (Pravastatin Sodium) .... One Qhs 23)  Bactrim Ds 800-160 Mg Tabs (Sulfamethoxazole-Trimethoprim) .... 2 Tabs Two Times A Day For 10 Days  Allergies (verified): No Known Drug Allergies  Review of Systems General:  Denies chills, fever, and sweats. CV:  Complains of swelling of feet; denies chest pain or discomfort and difficulty breathing while lying down. Resp:  Denies cough and wheezing. GI:  Complains of constipation. GU:  Denies dysuria. MS:  neck pain. Derm:  Complains of lesion(s). Psych:  "reports always being depressed".  Physical Exam  General:  Usual appearing Mouth:  missing  teeth Lungs:  normal respiratory effort and normal breath sounds.   Heart:  normal rate, regular rhythm, and no murmur.   Abdomen:  Large abdominal panus, central skin lesion with surrounding errythema and peau 'd orange appearance.  Small scab on back over spine, below past pressure areas. Extremities:  4+ lymphedema, pitting of feet, wearing slippers, no break in skin integrity Skin:  lesion with early cellulitis on abdomen   Impression & Recommendations:  Problem # 1:  INFECTION, SKIN AND SOFT TISSUE (ICD-686.9) Begin Bactrim DS 2 two times a day for 10 days, try to catch this early.  She picks and it starts.  She is to use antibiotic ointment and keep area covered to prevent a hard scab from forming.  She is to assess daily with a mirror, if it gets worse and does not seem to respond to treatment or if she develops fever and chills she should seek immediate attention. Orders: [facility]- Est  Level 4 (99214)  Problem # 2:  HYPOTENSION (ICD-458.9) BP was low with sepsis, had come up and ACE was resumed, under 100 today, decrease ACE to 5 mg, Orders: [facility]- Est  Level 4 (16109)  Problem # 3:  EDEMA, CHRONIC (ICD-782.3) Compression would help but she does not tolerate Unna boot, and cannot fit into compression stockings.  Real problem. Her updated medication list for this problem includes:    Demadex 20 Mg Tabs (Torsemide) .Marland Kitchen... Three tablets daily    Zaroxolyn 5 Mg Tabs (Metolazone) ..... Once weekly and prn  Problem # 4:  NECK PAIN (ICD-723.1)  Her updated medication list for this problem includes:    Tramadol Hcl 50 Mg Tabs (Tramadol hcl) .Marland Kitchen... 2 qid as needed pain    Carisoprodol 350 Mg Tabs (Carisoprodol) ..... One two times a day as needed muscle spasm    Ibuprofen 800 Mg Tabs (Ibuprofen) ..... One three times a day as needed pain  Orders: [facility]- Est  Level 4 (99214)  Complete Medication List: 1)  Effexor Xr 150 Mg Cp24 (Venlafaxine hcl) .... Take 2 capsule by mouth once a day 2)   Lamictal 150 Mg Tabs (Lamotrigine) .... Take 1 tablet by mouth once a day 3)  Trazodone Hcl 300 Mg Tabs (Trazodone hcl) .... Take 1 tablet at bedtime 4)  Lisinopril 10 Mg Tabs (Lisinopril) .... 1/2tab daily 5)  Vistaril 50 Mg Caps (Hydroxyzine pamoate) .... 2 qhs 6)  Tramadol Hcl 50 Mg Tabs (Tramadol hcl) .... 2 qid as needed pain 7)  Lyrica 75 Mg Caps (Pregabalin) .... One by mouth bid 8)  Demadex 20 Mg Tabs (Torsemide) .... Three tablets daily 9)  Peri-colace 8.6-50 Mg Tabs (Sennosides-docusate sodium) 10)  Zaroxolyn 5 Mg Tabs (Metolazone) .... Once weekly and prn 11)  Spiriva Handihaler 18 Mcg Caps (Tiotropium bromide monohydrate) .... One puff daily 12)  Midrin 325-65-100 Mg Caps (Apap-isometheptene-dichloral) .... One at onset of ha, may repeat in one hour if needed 13)  Triamcinolone Acetonide 0.1 % Crea (Triamcinolone acetonide) .... Apply to affected areas daily, 160 gm 14)  Klor-con M20 20 Meq Cr-tabs (Potassium chloride crys cr) .... Take two times daily every other day and three times daily on daus you takezaroxlyn 15)  Nystatin 100000 Unit/gm Powd (Nystatin) .... Apply two times a day to skin folds, very large amount for adult with extensive intertirgo. 16)  Senokot 8.6 Mg Tabs (Sennosides) 17)  Carisoprodol 350 Mg Tabs (Carisoprodol) .... One two times a day as needed muscle spasm 18)  3 in One Commode Chair  .... Dysmobility 19)  Cane  .... Gait abnormality 20)  Ibuprofen 800 Mg Tabs (Ibuprofen) .... One three times a day as needed pain 21)  Florastor 250 Mg Caps (Saccharomyces boulardii) .... One capsule daily 22)  Pravachol 40 Mg Tabs (Pravastatin sodium) .... One qhs 23)  Bactrim Ds 800-160 Mg Tabs (Sulfamethoxazole-trimethoprim) .... 2 tabs two times a day for 10 days  Patient Instructions: 1)  Keep ointment and bandaids on the sore 2)  Begin antibiotics 3)  Take the antifungal (fluconszole) after the course of antibiotics 4)  Return in 3 months Prescriptions: BACTRIM  DS 800-160 MG TABS (SULFAMETHOXAZOLE-TRIMETHOPRIM) 2 tabs two times a day for 10 days  #40 x 0   Entered and Authorized by:   Luretha Murphy NP   Signed by:   Luretha Murphy NP on 05/22/2010   Method used:   Electronically to        [facility] [phone]* (retail)       [address]       [place], [place]  [address]       Ph: [phone]       Fax: [phone]   RxID:   [id number]    Prevention & Chronic Care Immunizations   Influenza vaccine: Not documented    Tetanus booster: 06/15/2002: Done.   Tetanus booster due: 06/15/2012    Pneumococcal vaccine: Not documented  Other Screening   Pap smear: Done.  (11/16/2003)   Pap smear action/deferral: Not indicated S/P hysterectomy  (08/22/2009)   Pap smear due: 11/15/2004    Mammogram: Done.  (02/13/2005)   Mammogram action/deferral: Refused  (08/22/2009)   Mammogram due: 02/13/2006   Smoking status: quit  (05/22/2010)  Lipids   Total Cholesterol: 176  (11/16/2007)   LDL: 64  (11/16/2007)   LDL Direct: Not documented   HDL: 51  (11/16/2007)   Triglycerides: 307  (11/16/2007)   Lipid panel due: 11/21/2009    SGOT (AST): 11  (01/19/2010)   SGPT (ALT): 17  (01/19/2010)   Alkaline phosphatase: 80  (01/19/2010)   Total bilirubin: 0.3  (01/19/2010)    Lipid flowsheet reviewed?: Yes   Progress toward LDL goal: At goal  Hypertension   Last Blood Pressure: 91 / 56  (05/22/2010)   Serum creatinine: 1.07  (01/19/2010)   Serum potassium 4.9  (01/19/2010)  Self-Management Support :   Personal Goals (by the next clinic visit) :      Personal blood pressure goal: 130/80  (08/22/2009)     Personal LDL goal: 100  (08/22/2009)    Hypertension self-management support: Not documented    Lipid self-management support: Not documented

## 2011-01-22 ENCOUNTER — Encounter: Payer: Self-pay | Admitting: Family Medicine

## 2011-01-22 ENCOUNTER — Ambulatory Visit (INDEPENDENT_AMBULATORY_CARE_PROVIDER_SITE_OTHER): Payer: Medicare Other | Admitting: Family Medicine

## 2011-01-22 DIAGNOSIS — R609 Edema, unspecified: Secondary | ICD-10-CM

## 2011-01-22 DIAGNOSIS — M25569 Pain in unspecified knee: Secondary | ICD-10-CM | POA: Insufficient documentation

## 2011-01-22 DIAGNOSIS — R5381 Other malaise: Secondary | ICD-10-CM | POA: Insufficient documentation

## 2011-01-22 DIAGNOSIS — R5383 Other fatigue: Secondary | ICD-10-CM

## 2011-01-22 DIAGNOSIS — M545 Low back pain: Secondary | ICD-10-CM | POA: Insufficient documentation

## 2011-01-22 DIAGNOSIS — E785 Hyperlipidemia, unspecified: Secondary | ICD-10-CM

## 2011-01-22 DIAGNOSIS — I10 Essential (primary) hypertension: Secondary | ICD-10-CM

## 2011-01-23 NOTE — Progress Notes (Signed)
  Phone Note Call from Patient   Caller: Patient Call For: [phone] Summary of Call: Pt want provider to call her regarding her missed appt yesterday. Initial call taken by: Abundio Miu,  January 16, 2011 12:10 PM    New/Updated Medications: TRAMADOL HCL 50 MG  TABS (TRAMADOL HCL) 2 qid as needed pain LYRICA 75 MG CAPS (PREGABALIN) one by mouth bid DEMADEX 20 MG  TABS (TORSEMIDE) Three tablets daily NYSTATIN 100000 UNIT/GM POWD (NYSTATIN) apply two times a day to skin folds, very large amount for adult with extensive intertirgo. Prescriptions: LYRICA 75 MG CAPS (PREGABALIN) one by mouth bid  #60 x 3   Entered and Authorized by:   Luretha Murphy NP   Signed by:   Luretha Murphy NP on 01/16/2011   Method used:   Printed then faxed to ...       [facility]  [address] [phone]* (retail)       [address]       [place], [place]  [address]       Ph: [phone]       Fax: [phone]   RxID:   [id number] TRAMADOL HCL 50 MG  TABS (TRAMADOL HCL) 2 qid as needed pain  #240 x 3   Entered and Authorized by:   Luretha Murphy NP   Signed by:   Luretha Murphy NP on 01/16/2011   Method used:   Electronically to        [facility] [phone]* (retail)       [address]       [place], [place]  [address]       Ph: [phone]       Fax: [phone]   RxID:   [id number] NYSTATIN 100000 UNIT/GM POWD (NYSTATIN) apply two times a day to skin folds, very large amount for adult with extensive intertirgo.  #6 x 3   Entered and Authorized by:   Luretha Murphy NP   Signed by:   Luretha Murphy NP on 01/16/2011   Method used:   Electronically to        [facility] [phone]* (retail)       [address]       [place], [place]  [address]       Ph: [phone]       Fax: [phone]   RxID:   [id number] DEMADEX 20 MG  TABS (TORSEMIDE) Three tablets daily  #90 x 3   Entered and Authorized by:   Luretha Murphy NP   Signed by:   Luretha Murphy NP on 01/16/2011   Method used:   Electronically  to        [facility] [phone]* (retail)       [address]       [place], [place]  [address]       Ph: [phone]       Fax: [phone]   RxID:   [id number]

## 2011-01-24 ENCOUNTER — Telehealth: Payer: Self-pay | Admitting: Family Medicine

## 2011-01-24 NOTE — Telephone Encounter (Signed)
Called pt and she reports, that she knows where to go now. She will have her blood work done on 02-07-11

## 2011-01-31 NOTE — Assessment & Plan Note (Signed)
Summary: f/u,df   Vital Signs:  Patient profile:   50 year old female Height:      62.5 inches Weight:      351.3 pounds BMI:     63.46 Temp:     98.6 degrees F oral Pulse rate:   102 / minute BP sitting:   125 / 73  (left arm) Cuff size:   large  Vitals Entered By: Garen Grams LPN (January 22, 2011 4:32 PM) CC: f/u multiple issues Is Patient Diabetic? No Pain Assessment Patient in pain? yes        Primary Care Provider:  Luretha Murphy NP  CC:  f/u multiple issues.  History of Present Illness: Has not been in for 8 months, has canceled several apt as she was fearful of leaving the house.  She is better, and had no problems today.  Visited mental health last month, there were no changes in her meds.  Rare migranes, Midrin no longer formulated, she worries that if she has a migrane she will not have anyting to take.  She brought the bottle in and it was 3/4 full.  Skin is no longer making pustules or abcesses since she has been on doxycycline 100 mg proplylaxis (had ICU admission for cellulitis of pannus).  She is uing nystatin powder in her folds and this controls the overgrown of yeast.    She has not smoked for 2 years and wonders if she needs to stay on the Spiriva.   LE edema about the same, remains on high dose dieuretics, has not had labs in almost a year. Difficult blood draw and were unable to get blood today, she was sent to [facility] ourside lab.  Has blisters on her right LE that she picks at, just cannot leave it alone.  Cannot wear any shoes.  Lower back pain when she stands for any length of time and severe knee pain, she understands it is secondary to  her morbid obesity.  She is not a candidate for lap band as she cannot afford the adjustments, she is not sure she can handle the recovery and afford the food if she undergoes bypass.  Current Medications (verified): 1)  Effexor Xr 150 Mg Cp24 (Venlafaxine Hcl) .... Take 2 Capsule By Mouth Once A Day 2)  Lamictal  150 Mg Tabs (Lamotrigine) .... Take 1 Tablet By Mouth Once A Day 3)  Trazodone Hcl 300 Mg Tabs (Trazodone Hcl) .... Take 1 Tablet At Bedtime 4)  Lisinopril 10 Mg  Tabs (Lisinopril) .... 1/2tab Daily 5)  Vistaril 50 Mg  Caps (Hydroxyzine Pamoate) .... 2 Qhs 6)  Tramadol Hcl 50 Mg  Tabs (Tramadol Hcl) .... 2 Qid As Needed Pain 7)  Lyrica 75 Mg Caps (Pregabalin) .... One By Mouth Bid 8)  Demadex 20 Mg  Tabs (Torsemide) .... Three Tablets Daily 9)  Zaroxolyn 5 Mg Tabs (Metolazone) .... Once Weekly and Prn 10)  Spiriva Handihaler 18 Mcg Caps (Tiotropium Bromide Monohydrate) .... One Puff Daily 11)  Klor-Con M20 20 Meq Cr-Tabs (Potassium Chloride Crys Cr) .... Take Two Times Daily Every Other Day and Three Times Daily On Daus You Takezaroxlyn 12)  Nystatin 100000 Unit/gm Powd (Nystatin) .... Apply Two Times A Day To Skin Folds, Very Large Amount For Adult With Extensive Intertirgo. 13)  Pravachol 40 Mg Tabs (Pravastatin Sodium) .... One Qhs 14)  Doxycycline Hyclate 100 Mg Tabs (Doxycycline Hyclate) .... One Daily 15)  Triamcinolone Acetonide 0.1 % Crea (Triamcinolone Acetonide) .... Mix With  Eucerin Cream in A 1:1 Ratio For A 320 Gm Tub 16)  Amoxicillin 500 Mg Caps (Amoxicillin) .... One Three Times A Day For 7 Days  Allergies (verified): 1)  ! Aspirin (Aspirin)  Physical Exam  General:  Well appearing, morbildly obese. Mouth:  missing teeth, other in good repair Neck:  limited range Lungs:  clear Heart:  RRR, no murmur.   Abdomen:  large panus Extremities:  4+ lymphedema and lipidemia, few open area on her right lower leg Skin:  intertrignous folds pink, no breakdown, nystatin powder visible.  Venous stasis derm of both lower legs,  blisters on right lwer extremity unroofed, no drainage or signs of secondary infection. Psych:  normally interactive and good eye contact.     Impression & Recommendations:  Problem # 1:  HYPERTENSION, BENIGN SYSTEMIC (ICD-401.1) has required less meds  since episode of sepsis months ago Her updated medication list for this problem includes:    Lisinopril 10 Mg Tabs (Lisinopril) .Marland Kitchen... 1/2tab daily    Demadex 20 Mg Tabs (Torsemide) .Marland Kitchen... Three tablets daily    Zaroxolyn 5 Mg Tabs (Metolazone) ..... Once weekly and prn  Orders: Comp Met-[facility] [phone]) Lipid-[facility] (14782-95621) CBC-[facility] (30865) [facility]- Est  Level 4 (78469)  Problem # 2:  HYPERLIPIDEMIA (ICD-272.4) check labs Her updated medication list for this problem includes:    Pravachol 40 Mg Tabs (Pravastatin sodium) ..... One qhs  Orders: [facility]- Est  Level 4 (62952)  Problem # 3:  EDEMA, CHRONIC (ICD-782.3) continue plan, check labs Her updated medication list for this problem includes:    Demadex 20 Mg Tabs (Torsemide) .Marland Kitchen... Three tablets daily    Zaroxolyn 5 Mg Tabs (Metolazone) ..... Once weekly and prn  Orders: TSH-[facility] ([id number]) [facility]- Est  Level 4 (27253)  Problem # 4:  MORBID OBESITY (ICD-278.01) she does not have a plan, she has made some changes in her diet and did loose some weight when she was sick-gaining that back; she does not see surgery as an option, she does not think she can change her diet any more.  She has gone to diet soda, and eating earlier in the day. Orders: [facility]- Est  Level 4 (66440)  Problem # 5:  INTERTRIGO, CANDIDAL (ICD-695.89) Continue Nystatin powder, requires many bottles per month, but this is controlling skin breakdown beter than any other product that we have used.  Problem # 6:  KNEE PAIN, BILATERAL (ICD-719.46) happy with tramadol, tried hydrocodone, she does not like the constipation and lethargy that ensues.  Do worry about having serationergic meds on board (effexor, trazodone but does not seem to exibit any singns of seratonin syndrome) The following medications were removed from the medication list:    Ibuprofen 800 Mg Tabs (Ibuprofen) ..... One three times a day as needed pain Her updated medication list for this problem  includes:    Tramadol Hcl 50 Mg Tabs (Tramadol hcl) .Marland Kitchen... 2 qid as needed pain  Complete Medication List: 1)  Effexor Xr 150 Mg Cp24 (Venlafaxine hcl) .... Take 2 capsule by mouth once a day 2)  Lamictal 150 Mg Tabs (Lamotrigine) .... Take 1 tablet by mouth once a day 3)  Trazodone Hcl 300 Mg Tabs (Trazodone hcl) .... Take 1 tablet at bedtime 4)  Lisinopril 10 Mg Tabs (Lisinopril) .... 1/2tab daily 5)  Vistaril 50 Mg Caps (Hydroxyzine pamoate) .... 2 qhs 6)  Tramadol Hcl 50 Mg Tabs (Tramadol hcl) .... 2 qid as needed pain 7)  Lyrica 75 Mg Caps (Pregabalin) .Marland KitchenMarland KitchenMarland Kitchen  One by mouth bid 8)  Demadex 20 Mg Tabs (Torsemide) .... Three tablets daily 9)  Zaroxolyn 5 Mg Tabs (Metolazone) .... Once weekly and prn 10)  Spiriva Handihaler 18 Mcg Caps (Tiotropium bromide monohydrate) .... One puff daily 11)  Klor-con M20 20 Meq Cr-tabs (Potassium chloride crys cr) .... Take two times daily every other day and three times daily on daus you takezaroxlyn 12)  Nystatin 100000 Unit/gm Powd (Nystatin) .... Apply two times a day to skin folds, very large amount for adult with extensive intertirgo. 13)  Pravachol 40 Mg Tabs (Pravastatin sodium) .... One qhs 14)  Doxycycline Hyclate 100 Mg Tabs (Doxycycline hyclate) .... One daily 15)  Triamcinolone Acetonide 0.1 % Crea (Triamcinolone acetonide) .... Mix with eucerin cream in a 1:1 ratio for a 320 gm tub 16)  Amoxicillin 500 Mg Caps (Amoxicillin) .... One three times a day for 7 days  Patient Instructions: 1)  Please schedule a follow-up appointment in 3 months .  2)  Plase go to the [facility] lab on [address] to have your labs drawn Prescriptions: AMOXICILLIN 500 MG CAPS (AMOXICILLIN) one three times a day for 7 days  #21 x 0   Entered and Authorized by:   Luretha Murphy NP   Signed by:   Luretha Murphy NP on 01/22/2011   Method used:   Electronically to        [facility] [phone]* (retail)       [address]       [place], [place]  [address]       Ph:  [phone]       Fax: [phone]   RxID:   [id number] TRIAMCINOLONE ACETONIDE 0.1 % CREA (TRIAMCINOLONE ACETONIDE) mix with Eucerin Cream in a 1:1 ratio for a 320 GM tub  #1 x 3   Entered and Authorized by:   Luretha Murphy NP   Signed by:   Luretha Murphy NP on 01/22/2011   Method used:   Electronically to        [facility] [phone]* (retail)       [address]       [place], [place]  [address]       Ph: [phone]       Fax: [phone]   RxID:   [id number]    Orders Added: 1)  Comp Met-[facility] [[id number]] 2)  Lipid-[facility] [27062-37628] 3)  CBC-[facility] [85027] 4)  TSH-[facility] [31517-61607] 5)  [facility]- Est  Level 4 [37106]

## 2011-02-05 ENCOUNTER — Encounter: Payer: Self-pay | Admitting: Family Medicine

## 2011-02-13 ENCOUNTER — Other Ambulatory Visit: Payer: Self-pay | Admitting: Family Medicine

## 2011-02-13 NOTE — Telephone Encounter (Signed)
Refill request

## 2011-02-13 NOTE — Telephone Encounter (Signed)
Please review and refill

## 2011-03-03 LAB — BASIC METABOLIC PANEL
BUN: 21 mg/dL (ref 6–23)
BUN: 25 mg/dL — ABNORMAL HIGH (ref 6–23)
CO2: 28 mEq/L (ref 19–32)
Calcium: 8.2 mg/dL — ABNORMAL LOW (ref 8.4–10.5)
Calcium: 8.5 mg/dL (ref 8.4–10.5)
Calcium: 8.7 mg/dL (ref 8.4–10.5)
Chloride: 102 mEq/L (ref 96–112)
Chloride: 109 mEq/L (ref 96–112)
Creatinine, Ser: 1.05 mg/dL (ref 0.4–1.2)
Creatinine, Ser: 1.3 mg/dL — ABNORMAL HIGH (ref 0.4–1.2)
GFR calc Af Amer: 52 mL/min — ABNORMAL LOW (ref >60)
GFR calc Af Amer: >60 mL/min (ref >60)
GFR calc non Af Amer: 44 mL/min — ABNORMAL LOW (ref >60)
Glucose, Bld: 197 mg/dL — ABNORMAL HIGH (ref 70–99)
Glucose, Bld: 88 mg/dL (ref 70–99)
Potassium: 3.6 mEq/L (ref 3.5–5.1)
Sodium: 140 mEq/L (ref 135–145)
Sodium: 143 mEq/L (ref 135–145)
Sodium: 144 mEq/L (ref 135–145)

## 2011-03-03 LAB — CARBOXYHEMOGLOBIN
Methemoglobin: 0.7 % (ref 0.0–1.5)
O2 Saturation: 63.5 %
Total hemoglobin: 11.1 g/dL — ABNORMAL LOW (ref 12.5–16.0)
Total hemoglobin: 9.7 g/dL — ABNORMAL LOW (ref 12.5–16.0)

## 2011-03-03 LAB — CULTURE, BLOOD (ROUTINE X 2)
Culture: NO GROWTH
Culture: NO GROWTH

## 2011-03-03 LAB — CBC
HCT: 33.5 % — ABNORMAL LOW (ref 36.0–46.0)
Hemoglobin: 10.7 g/dL — ABNORMAL LOW (ref 12.0–15.0)
MCHC: 32.5 g/dL (ref 30.0–36.0)
MCHC: 36.4 g/dL — ABNORMAL HIGH (ref 30.0–36.0)
MCV: 101.7 fL — ABNORMAL HIGH (ref 78.0–100.0)
MCV: 94.4 fL (ref 78.0–100.0)
MCV: 96.1 fL (ref 78.0–100.0)
Platelets: 196 10*3/uL (ref 150–400)
Platelets: 265 10*3/uL (ref 150–400)
Platelets: 275 10*3/uL (ref 150–400)
RBC: 3.43 MIL/uL — ABNORMAL LOW (ref 3.87–5.11)
RBC: 3.44 MIL/uL — ABNORMAL LOW (ref 3.87–5.11)
RBC: 3.66 MIL/uL — ABNORMAL LOW (ref 3.87–5.11)
RDW: 14.5 % (ref 11.5–15.5)
RDW: 14.8 % (ref 11.5–15.5)
WBC: 7.7 10*3/uL (ref 4.0–10.5)
WBC: 8.8 10*3/uL (ref 4.0–10.5)
WBC: 9.5 10*3/uL (ref 4.0–10.5)

## 2011-03-03 LAB — POCT I-STAT 3, ART BLOOD GAS (G3+)
Acid-Base Excess: 5 mmol/L — ABNORMAL HIGH (ref 0.0–2.0)
Bicarbonate: 30.8 mEq/L — ABNORMAL HIGH (ref 20.0–24.0)
TCO2: 32 mmol/L (ref 0–100)
pO2, Arterial: 91 mmHg (ref 80.0–100.0)

## 2011-03-03 LAB — COMPREHENSIVE METABOLIC PANEL
ALT: 15 U/L (ref 0–35)
AST: 19 U/L (ref 0–37)
Albumin: 2.9 g/dL — ABNORMAL LOW (ref 3.5–5.2)
Calcium: 8 mg/dL — ABNORMAL LOW (ref 8.4–10.5)
Creatinine, Ser: 1.34 mg/dL — ABNORMAL HIGH (ref 0.4–1.2)
GFR calc Af Amer: 51 mL/min — ABNORMAL LOW (ref >60)
Sodium: 139 mEq/L (ref 135–145)
Total Protein: 6.2 g/dL (ref 6.0–8.3)

## 2011-03-03 LAB — GLUCOSE, CAPILLARY
Glucose-Capillary: 131 mg/dL — ABNORMAL HIGH (ref 70–99)
Glucose-Capillary: 144 mg/dL — ABNORMAL HIGH (ref 70–99)
Glucose-Capillary: 147 mg/dL — ABNORMAL HIGH (ref 70–99)
Glucose-Capillary: 167 mg/dL — ABNORMAL HIGH (ref 70–99)
Glucose-Capillary: 84 mg/dL (ref 70–99)
Glucose-Capillary: 86 mg/dL (ref 70–99)
Glucose-Capillary: 90 mg/dL (ref 70–99)

## 2011-03-03 LAB — APTT: aPTT: 41 seconds — ABNORMAL HIGH (ref 24–37)

## 2011-03-03 LAB — PROTIME-INR
INR: 1.2 (ref 0.00–1.49)
Prothrombin Time: 15.1 seconds (ref 11.6–15.2)

## 2011-03-03 LAB — HEMOGLOBIN A1C: Hgb A1c MFr Bld: 6 % (ref 4.6–6.1)

## 2011-03-03 LAB — MAGNESIUM: Magnesium: 2 mg/dL (ref 1.5–2.5)

## 2011-03-03 LAB — TYPE AND SCREEN: ABO/RH(D): O NEG

## 2011-03-03 LAB — ABO/RH: ABO/RH(D): O NEG

## 2011-03-04 LAB — CBC
HCT: 35.2 % — ABNORMAL LOW (ref 36.0–46.0)
MCHC: 33.3 g/dL (ref 30.0–36.0)
MCV: 94.7 fL (ref 78.0–100.0)
Platelets: 270 10*3/uL (ref 150–400)
RBC: 3.71 MIL/uL — ABNORMAL LOW (ref 3.87–5.11)

## 2011-03-04 LAB — BASIC METABOLIC PANEL
BUN: 26 mg/dL — ABNORMAL HIGH (ref 6–23)
CO2: 36 mEq/L — ABNORMAL HIGH (ref 19–32)
Chloride: 89 mEq/L — ABNORMAL LOW (ref 96–112)
Creatinine, Ser: 1.32 mg/dL — ABNORMAL HIGH (ref 0.4–1.2)
Potassium: 3.4 mEq/L — ABNORMAL LOW (ref 3.5–5.1)

## 2011-03-04 LAB — DIFFERENTIAL
Basophils Relative: 0 % (ref 0–1)
Eosinophils Absolute: 0 10*3/uL (ref 0.0–0.7)
Eosinophils Relative: 0 % (ref 0–5)
Monocytes Relative: 7 % (ref 3–12)
Neutrophils Relative %: 72 % (ref 43–77)

## 2011-03-13 ENCOUNTER — Other Ambulatory Visit: Payer: Self-pay | Admitting: Family Medicine

## 2011-03-13 NOTE — Telephone Encounter (Signed)
Refill request

## 2011-04-09 ENCOUNTER — Other Ambulatory Visit: Payer: Self-pay | Admitting: Family Medicine

## 2011-04-09 NOTE — Telephone Encounter (Signed)
Refill request

## 2011-04-11 ENCOUNTER — Other Ambulatory Visit: Payer: Self-pay | Admitting: Family Medicine

## 2011-04-11 NOTE — Telephone Encounter (Signed)
Refill request

## 2011-04-23 ENCOUNTER — Ambulatory Visit: Payer: Medicare Other | Admitting: Family Medicine

## 2011-05-03 ENCOUNTER — Ambulatory Visit: Payer: Medicare Other | Admitting: Family Medicine

## 2011-05-03 NOTE — Consult Note (Signed)
NAME:  Kathleen Roy, Kathleen Roy                            ACCOUNT NO.:  [id number]   MEDICAL RECORD NO.:  [id number]                   PATIENT TYPE:  EMS   LOCATION:  [facility]                                 FACILITY:  [facility]   PHYSICIAN:  Sondra Come, D.O.                 DATE OF BIRTH:  1961-08-29   DATE OF CONSULTATION:  11/05/2002  DATE OF DISCHARGE:  10/23/2002                                   CONSULTATION   REASON FOR CONSULTATION:  The patient returns to the clinic today for re-  evaluation.  She was last seen on October 18, 2002 at which time I started  her on Duragesic 25 mcg patches.  The patient states that she has had a  difficult time getting the Duragesic patches to stick and she essentially  has quit using them, although she did state that her first patch stayed on  for about three days and she did get some improvement in her pain symptoms,  especially with her knee and back pain.  Also in the interim, she has had  some intermittent abdominal pain which has waxed and waned.  She states she  went to the emergency room about two weeks ago where she had a CT scan of  her abdomen which she reports as negative.  She was given a prescription for  Percocet but states it didn't help.  She had called our clinic to inform us  of this.  She is status post appendectomy, cholecystectomy  and total  abdominal hysterectomy with bilateral salpingo-oophorectomy some years back.  In addition, she complains of some vaginal bleeding over the past two days  and she will follow up with her primary care physician's office today for a  urinalysis and will get back in to see her in regards to her vaginal  bleeding.  She states that the abdominal pain has increased her low back  pain to some degree and her knees are really bothering her as well.  She  continues to take Ultracet 8 per day as well as using Lidoderm patches  sporadically.  Her pain today is a 9/10 on a subjective scale.  I reviewed  her health  and history form and 14 point review of systems. She denies any  radicular complaints.   PHYSICAL EXAMINATION:  MUSCULOSKELETAL:  Examination of the back reveals  level pelvis with increased lumbar lordosis.  There is significant  tenderness to palpation bilateral lumbar paraspinals to very light  palpation.  Range of motion is limited in all planes secondary to pain  especially with extension.  There is no heat, erythema or edema in the lower  extremities.  Range of motion of the knees is full with tenderness to  palpation diffusely about the knees.   IMPRESSION:  1. Chronic low back pain with lumbar facet arthropathy, persistent.  2. Severe osteoarthritis bilateral knees.  3. Abdominal  pain of uncertain etiology.  4. Recent vaginal bleeding.  5. Obesity.  6. History of depression, anxiety and panic disorder.   PLAN:  1. I had a long discussion with Kathleen Roy regarding treatment options.  Would     like to continue with Duragesic although I have given her other options     in terms of pain medications, however, finances will be an important     issue as the patient's Medicaid card will be cut off once she receives     her disability and she will need to pay out of pocket for prescriptions.     We discussed signing her up for the patient assistance program and she     wishes to do so.  In the interim the patient wishes to continue on the     Ultracet and the Lidoderm patches until she gets her abdominal symptoms     worked up more thoroughly as well as her vaginal bleeding.  I think this     is a reasonable plan and will continue to monitor her symptoms.     Furthermore, the patient will be unable to get any further prescriptions     this month secondary to using all six of her Medicaid allowed     prescriptions.  2. I will follow the patient in two weeks for re-evaluation.   The patient was educated regarding findings and recommendations and  understands.  There were no barriers  to communication.                                               Sondra Come, D.O.    JJW/MEDQ  D:  11/05/2002  T:  11/06/2002  Job:  [id number]

## 2011-05-03 NOTE — Assessment & Plan Note (Signed)
HISTORY:  The patient is back regarding her bilateral knee pain and low back  pain.  She had relief for two to three weeks with the steroid injections.  She did have some significant nausea and flu-like symptoms after the  injections, however, that seemed to prevent her from really enjoying the  pain relief.  The patient has not taken her Kadian at this point, secondary  to worries about constipation.  She is using Ultracet for break-through pain  up to six to eight tab a day.  For logistical reasons, or whatever they may  be, the patient has not seen [facility] regarding her knees.  The patient denies any new nausea, vomiting, diarrhea, or chest pain.  She  is having most of her knee pain when she transfers and stands initially.  Her back hurts her for periods when she stands up, even when she does dishes  or works in the house.   PHYSICAL EXAMINATION:  GENERAL:  The patient is extremely pleasant, in no  acute distress.  She remains obese without weight change.  ABDOMEN:  Bowel sounds are stable.  EXTREMITIES:  The patient has continued bilateral knee effusions with  tenderness on palpation and typical pain with flexion and range of motion of  today.  Positive meniscal signs are noted once again, and worse pain with  any resistive movement.  The patient walks with an antalgic gait pattern to  the other side, with difficulty transferring.   ASSESSMENT:  1. 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  2. Low back pain, probably facet in nature.  3. Moderate obesity.   PLAN:  1. Will follow up on referral to the [facility].  2. Discussed beginning the Kadian at 20 mg daily.  The patient seemed to be     more open to doing so this time.  We will check to see if they have a     drug assistance program.  3. The patient may continue Ultracet for break-through pain.  4. I will see the patient back in approximately three  months' time.      Ranelle Oyster, M.D.   ZTS/MedQ  D:  11/22/2003 16:12:29  T:  11/22/2003 17:08:23  Job #:  [id number]

## 2011-05-03 NOTE — Op Note (Signed)
[facility]  Patient:    Kathleen Roy, Kathleen Roy Visit Number: [id number] MRN: [id number]          Service Type: DSU Location: [facility] Attending Physician:  Antionette Char Dictated by:   Roseanna Rainbow, M.D. Proc. Date: 05/12/02 Admit Date:  05/12/2002 Discharge Date: 05/12/2002   CC:         [facility], [address]   Operative Report  PREOPERATIVE DIAGNOSES:       Rule out a vaginal intraepithelial neoplastic lesion.  POSTOPERATIVE DIAGNOSES:      Rule out a vaginal intraepithelial neoplastic lesion.  PROCEDURE:                    Colposcopy with biopsies.  SURGEONS:                     Roseanna Rainbow, M.D., Bing Neighbors. Clearance Coots, M.D.  ANESTHESIA:                   Local and managed anesthesia care.  COMPLICATIONS:                None.  ESTIMATED BLOOD LOSS:         Negligible.  FINDINGS:                     After acetic acid was applied to the vagina and vaginal cuff area, there were no acetowhite areas or abnormal vasculature noted.  Prior to applying the acetic acid, again, there were no obvious lesions noted.  PROCEDURE:                    The patient was taken to the operating room. She was placed in the dorsal lithotomy position.  A large Graves speculum was placed in the vagina.  The cuff was infiltrated with 1% lidocaine and a single-tooth tenaculum was applied to this location.  Acetic acid was then applied to the vaginal cuff area and vagina, and allowed to take effect.  The cuff area, especially, was then visualized with the colposcope and the above findings were noted.  Random biopsies were then taken using Tischler biopsy forceps of the vaginal cuff area.  The single-tooth tenaculum was removed and Munsell was applied to the biopsy sites.  Excellent hemostasis was noted.  The patient tolerated the procedure well.  The patient was taken to the PACU in stable condition.  PATHOLOGY:                     Vaginal biopsies. Dictated by:   Roseanna Rainbow, M.D. Attending Physician:  Antionette Char DD:  05/13/02 TD:  05/14/02 Job: [id number]

## 2011-05-03 NOTE — Op Note (Signed)
[facility]  Patient:    Kathleen Roy, Kathleen Roy                         MRN: [id number] Proc. Date: 07/14/01 Adm. Date:  [id number] Attending:  Bonnetta Barry CC:         Juluis Mire, M.D.   Operative Report  PREOPERATIVE DIAGNOSIS:  Incarcerated ventral hernia.  POSTOPERATIVE DIAGNOSIS:  Incarcerated ventral hernia.  OPERATION PERFORMED:  Repair of incarcerated ventral hernia with Prolene mesh.  SURGEON:  Velora Heckler, M.D.  ANESTHESIA:  General.  ESTIMATED BLOOD LOSS:  150 cc.  PREPARATION:  Betadine.  COMPLICATIONS:  None.  INDICATIONS FOR PROCEDURE:  The patient is a 50 year old white female who presents with greater than one-year history of left-sided abdominal pain. This had been initially followed in [place], [place].  The patient was seen at the [facility] Emergency Department in March 2002. Outpatient CT scan of the abdomen was obtained which showed a 12 cm mass in the subcutaneous tissues the left lower quadrant of the abdominal wall with approximately a 4.2 cm abdominal wall hernia.  The hernia sac contained distal transverse colon.  The patient now comes to surgery for repair.  DESCRIPTION OF PROCEDURE:  The procedure was done in OR #17 at the [facility].  The patient was brought to the operating room and placed in supine position on the operating room table.  Following administration of general anesthesia, the patient was prepped and draped in the usual strict aseptic fashion.  After ascertaining that an adequate level of anesthesia had been obtained, an incision was made in the left lower quadrant of the abdominal wall with a #10 blade.  Dissection was carried down through subcutaneous tissues and hemostasis obtained with the electrocautery. There is approximately a 15 cm subcutaneous mass representing a hernia sac. This was dissected out circumferentially and down to the  fascia.  The fascial defect measured approximately 4 to 5 cm in greatest dimension.  Hernia sac is opened.  It contains incarcerated omentum and colon.  The fascial defect was enlarged medially.  With some effort the hernia sac was excised and the omentum and colon were reduced back within the peritoneal cavity.  Good hemostasis was noted.  The fascial layers were closed with interrupted #1 Ethibond sutures.  The fascial plane was developed circumferentially.  A 3 inch x 6 inch sheet of Prolene mesh was brought on the field and secured to the abdominal wall.  Circumferentially with interrupted #1 Ethibond sutures. Good hemostasis was noted.  A #10 drain was brought in from a left lateral stab wound, secured to the skin with a 3-0 nylon suture.  Subcutaneous tissues were reapproximated with interrupted 2-0 Vicryl sutures.  The skin edges were closed with stainless steel staples.  The wound was washed and dried and a sterile dressing was applied.  A drain was placed to bulb suction.  The patient was awakened from anesthesia and transferred to the recovery room in stable condition.  The patient tolerated the procedure well. DD:  07/14/01 TD:  07/14/01 Job: [id number]

## 2011-05-03 NOTE — Op Note (Signed)
Kathleen Roy, Kathleen Roy                              ACCOUNT NO.:  [id number]   MEDICAL RECORD NO.:  [id number]                   PATIENT TYPE:   LOCATION:                                       FACILITY:  [facility]   PHYSICIAN:  Sondra Come, D.O.                 DATE OF BIRTH:  12-03-1961   DATE OF PROCEDURE:  10/04/2002  DATE OF DISCHARGE:                                 OPERATIVE REPORT   HISTORY:  The patient returns to clinic today for trial of lumbar facet  injections for chronic low back pain with lumbar facet arthropathy.  She was  last seen on October 01, 2002.  She continues to have significant pain in  her lower back mainly with standing and walking.  Her pain is 9/10 on  subjective scale.  I reviewed health and history form and her 14-point  review of systems.   PHYSICAL EXAMINATION:  Vital signs are stable.   IMPRESSION:  1. Chronic low back pain, mechanical and myofascial with lumbar facet     arthropathy.  2. Severe osteoarthritis bilateral knees.  3. Obesity.  4. History of depression, anxiety, and panic disorder.   PLAN:  1. Trial of bilateral L4-5 and L5-S1 facet joint injections diagnostically     and therapeutically.  2. Continue Lidoderm and Ultracet.  3. Await TENS unit.  4. The patient to return to clinic in 2 weeks for reevaluation and possible     repeat injection as predicated upon the patient's response and symptoms.   PROCEDURE:  Bilateral L4-5 and L5-S1 facet joint injections.  The procedure  was described to the patient in detail including risks, benefits,  complications and alternatives.  The patient wishes to proceed.  Informed  consent was obtained.  The patient was brought back to the fluoroscopy  suite, placed on the table in prone position.  Skin was prepped and draped  in usual sterile fashion.  Skin and subcutaneous tissues were anesthetized  with 2 cc of 1% lidocaine at four independent needle access points.  Under  direct fluoroscopic  guidance, a 22-gauge 5-inch spinal needle was advanced  into the inferior recesses of bilateral L5-S1 facet joints.  Needle tip  placement was confirmed after bony contact was made and negative aspiration  performed with the injection of 0.25 cc of Omnipaque 180 revealing facet  arthrograms.  Each joint was then injected with 0.25 cc of Kenalog 40 mg/cc  plus 1.25 cc of 1% lidocaine.  Under direct fluoroscopic guidance, the  spinal needle was then advanced into the L4-5 facet joints under oblique and  AP views.  Needle tip placement was confirmed after negative aspiration of  the injection of 0.25 cc of Omnipaque 180 revealing bilateral L4-5 facet  joint arthrograms.  Each joint was then injected with 0.25 cc of Kenalog 40  mg/cc plus 1.25 cc of 1% lidocaine.  The patient tolerated the procedure  well.  Discharge instructions were given.  Postinjection pain score is 7/10  on a subjective scale.  The patient was monitored and released in stable  condition.   The patient was educated about findings and recommendations and understands.  There were no barriers to communication.                                               Sondra Come, D.O.    JJW/MEDQ  D:  10/04/2002  T:  10/04/2002  Job:  [id number]

## 2011-05-03 NOTE — H&P (Signed)
[facility]  Patient:    Kathleen Roy, Kathleen Roy Visit Number: [id number] MRN: [id number]          Service Type: DSU Location: [facility] Attending Physician:  Antionette Char Dictated by:   Roseanna Rainbow, M.D. Admit Date:  05/12/2002 Discharge Date: 05/12/2002   CC:         [facility], [address]  Pricilla Holm, M.D. at the [facility]   History and Physical  CHIEF COMPLAINT:              The patient is a 50 year old status post TAH/BSO for endometriosis, now with rule out VAIN at the vaginal cuff.  HISTORY OF PRESENT ILLNESS:   As above, the patient presented for routine care to Dr. Pricilla Holm at the [facility].  A Pap smear was taken in late March 2003 that demonstrated a possible vaginal intraepithelial lesion that was considered to be low grade.  A repeat Pap test was taken a month later and this Pap smear was consistent with a possible moderate dysplastic lesion.  An attempt was made to perform office colposcopy; however, the exam was limited by the patients body habitus and very limited exposure.  PAST OBSTETRICAL AND GYNECOLOGICAL HISTORY:        As above.  The patient denies any history of any previous abnormal Pap smears.  PAST MEDICAL HISTORY:         Remarkable for depression, osteoarthritis, and morbid obesity.  PAST SURGICAL HISTORY:        As above.  She is also status post an appendectomy, cholecystectomy, and herniorrhaphy.  ALLERGIES:                    ASPIRIN.  MEDICATIONS:                  Effexor, Premarin, Relafen, Xanax, Desyrel, K-Dur, Vicodin, Midrin, and Lasix.  SOCIAL HISTORY:               She currently smokes one pack per day and has smoked for 20 years.  Alcohol:  Does not give any significant history of alcohol usage.  Illicit drugs:  Denies illicit drug use.  FAMILY HISTORY:               Mother with a history of a  CVA.  PHYSICAL EXAMINATION:  VITAL SIGNS:                  Height 5 feet 2 inches, weight 332 pounds. Blood pressure 130/90, respirations 22, heart rate 80.  GENERAL:                      Obese, no apparent distress.  CHEST:                        Clear to auscultation bilaterally.  HEART:                        Regular rate and rhythm.  ABDOMEN:                      Multiple scars, obese, nontender.  PELVIC:                       The external female genitalia are normal appearing.  On speculum exam,  the vaginal apex could not be well visualized. However, no lesions were noted of the vaginal epithelium that could be visualized.  Bimanual exam is limited by the patients body habitus.  However, no masses were palpated, nontender.  ASSESSMENT:                   1. Rule out vaginal intraepithelial neoplasm.                               2. Status post hysterectomy, possible field                                  effect.  PLAN:                         Exam under anesthesia with colposcopic-directed biopsies. Dictated by:   Roseanna Rainbow, M.D. Attending Physician:  Antionette Char DD:  04/21/02 TD:  04/21/02 Job: [id number]

## 2011-05-03 NOTE — Consult Note (Signed)
NAME:  Kathleen Roy, Kathleen Roy                            ACCOUNT NO.:  [id number]   MEDICAL RECORD NO.:  [id number]                   PATIENT TYPE:  REC   LOCATION:  [facility]                                  FACILITY:  [facility]   PHYSICIAN:  Sondra Come, D.O.                 DATE OF BIRTH:  Apr 11, 1961   DATE OF CONSULTATION:  08/24/2002  DATE OF DISCHARGE:                                   CONSULTATION   Thank you very much for kindly referring [facility].  The patient was evaluated in  our clinic today.  Please refer to the following for details regarding the  history and physical examination and treatment plan.  Once again, thank you  for allowing [place] to participate in the care of this patient.   CHIEF COMPLAINT:  Back pain and bilateral knee pain.   HISTORY OF PRESENT ILLNESS:  The patient is a pleasant 50 year old right  hand dominant female with a long history of low back pain and bilateral knee  pain for which she has been diagnosed with osteoarthritis. In terms of her  low back pain, she describes this as nonradicular with no associated  numbness or paresthesias.  She has had x-rays performed by Dr. Farris Has,  orthopedist, which she states revealed osteoarthritic changes.  He has  apparently prescribed Vicodin to her which she has only taken two doses.  The first dose she took one 7.5 mg hydrocodone pill which did not given her  any relief.  On the second dose, she took two 7.5 mg hydrocodone pills and  she states that she slept for a long time.  She has not taken any since.  She is somewhat reluctant to take narcotic based pain medicine.  Her back  pain seems to be worse with standing and prolonged sitting and seems to be  improved with sitting on occasion as well as leaning forward.  She has been  to physical therapy which she states did not help her low back pain.  They  did teach her some exercises but she has not been  consistent at performing a  home exercise program.  In terms of the patient's knee pain, she has been  followed by Dr. Thurston Hole.  She has had x-rays which are not available to me at  this time which, according to the patient revealed some spurring as well as  significant osteoarthritic changes with decreased joint space narrowing.  She has tried multiple medications including Bextra, Celebrex, Vioxx,  Ultram, Relafen and local steroid injections without any relief.  She also  had, what sounds like, either Synvisc or Hyalgan injections without relief.  Most recently, her primary care physician started her on Lodine which also  has not given her any significant relief.  Her knee pain is worse with  climbing up and down  steps as well as standing and walking.  Her pain is  rated as a 10/10 on subjective scale today and described as constant,  throbbing, sharp, stabbing.  Again, symptoms have caused a significant  decline in her function and quality of life indices.  She states it is  difficult to even cook a meal or take a shower without significant pain.  She has had physical therapy for her knee pain which she states aggravated  her symptoms.  We discussed aquatic type therapy but she has significant  financial limitations.  Again, we discussed a consistent home exercise  program and she denies this.  I reviewed the health and history form and 14-  point review of systems.  The patient denies bowel or bladder dysfunction.  She denies fever, chills, night sweats, weight loss.  She admits to  excessive worry and depression as well as nervous disorder and panic  attacks.   PAST MEDICAL HISTORY:  Hypertension, depression, anxiety, panic attacks.   PAST SURGICAL HISTORY:  Herniorrhaphy, cholecystectomy, appendectomy, total  hysterectomy.   FAMILY HISTORY:  Disability, hypertension.   SOCIAL HISTORY:  The patient smokes one pack of cigarettes per day and I  counseled her on the importance of  smoking cessation in terms of pain and  overall health.  She denies alcohol or drug use.  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   ALLERGIES:  Aspirin.   MEDICATIONS:  Effexor, Premarin, Atenolol, Xanax XR, Lasix, K-Dur, Lodine,  Desyrel, Midrin, Vicodin rarely, and Xanax.   PHYSICAL EXAMINATION:  GENERAL APPEARANCE:  An obese female in no acute  distress.  VITAL SIGNS:  Blood pressure 113/51, pulse 80, respiratory rate 20, O2  saturation 95% on room air.  BACK:  Examination of the back reveals level pelvis without scoliosis.  There is significantly increased lumbar lordosis.  Range of motion of the  lumbar spine is full in all planes with pain on extension, flexion, side  bending and with significant paraspinal tenderness in the lumbosacral  region.  EXTREMITIES:  Examination of the knees does not reveal any heat, erythema or  effusions.  There is full range of motion bilaterally.  Negative anterior  drawer, posterior drawer, Lachman's, McMurray's bilaterally. There is no  medial or lateral instability noted bilaterally.  There is tenderness to  palpation over the medial joint lines bilaterally.  Negative patellar  apprehension bilaterally.  The patellae are nonballotable bilaterally.  NEUROLOGIC:  Manual muscle testing is 5/5 lower extremities.  Sensory  examination is intact to light touch bilateral lower extremities.  Muscle  stretch reflexes are 2+/4 bilateral patellar and medial hamstrings and  Achilles.  Straight leg raising is negative bilaterally but with  significantly tight hamstrings right greater than left.  This tends to  reproduce the patient's low back pain.  Pearlean Brownie is negative bilaterally with  significantly tight hip flexors.   IMPRESSION:  1. Chronic low back pain, mechanical and myofascial.  I suspect a     significant component of lumbar facet arthropathy contributing to the     patient's current symptoms.  Her obesity is also a contributing factor as     is poor lower extremity flexibility.  2. Severe osteoarthritis bilateral knees.  Essentially nonoperable as the     patient is not interested in any surgical procedures at this time.  3. Obesity.  4. History of depression, anxiety and panic disorder.  PLAN:  1. I had a long discussion with the patient regarding treatment options for     her low back and knee pain. This was an extensive consultation of 45     minutes duration.  We discussed medications as well as interventional     procedures to assist her with her pain management.  2. Will gather further records to enhance our database to include radiologic     imaging studies from her orthopedic office.  3. Counseled the patient on importance of a consistent home exercise program     at this point to include pelvic tilt exercises which we discussed.  I     also demonstrated proper hip flexor and hamstring stretching.  This     needed to be done on a daily basis.  4. Will prescribe Lidoderm 5% patches, apply up to three patches 12 hours     per day #90 with no refill.  5. Will give Ultracet samples #20 to be taken one to two p.o. q.i.d. as     needed.  Also will write a prescription for Ultracet one to two p.o.     q.i.d. as needed #100 with one refill.  6. If the patient is not improving, will consider obtaining a lumbar MRI.     Would consider interventional procedures as predicated upon the patient's     response to the above treatment and imaging studies to include lumbar     facet injections versus lumbar epidural steroid injection.  7. The patient to return to clinic in one month for reevaluation.   The patient was educated on the above findings and recommendations and  understands.  There were no barriers to communication.                                                Sondra Come, D.O.    JJW/MEDQ  D:  08/24/2002  T:  08/25/2002  Job:   [id number]   cc:   Pricilla Holm, M.D.  [facility]  [place]  [place] [address]  Fax: [phone]

## 2011-05-03 NOTE — Consult Note (Signed)
NAME:  Kathleen Roy, Kathleen Roy                            ACCOUNT NO.:  [id number]   MEDICAL RECORD NO.:  [id number]                   PATIENT TYPE:  REC   LOCATION:  [facility]                                  FACILITY:  [facility]   PHYSICIAN:  Zachary George, DO                      DATE OF BIRTH:  01-23-1961   DATE OF CONSULTATION:  03/22/2003  DATE OF DISCHARGE:                                   CONSULTATION   Kathleen Roy returns to clinic today for reevaluation. She was last seen on  02/07/03. She really has not had any significant change in her condition. She  continues to have significant low back pain and bilateral knee pain. She is  morbidly obese, and we discussed weight loss. She states that she continues  to gain weight despite dieting efforts. She states she is considering  discussing gastric bypass surgery with her primary care Kathleen Roy. She really  wants to begin exercising, but she states she is too much pain. We have  discussed aquatic therapy in the past. The patient does not feel like she  can afford aquatic therapy. We discussed possibly joining the [facility] and that  the [employer] typically has plans for people of low income. I have instructed her  to call the [employer] to find out more information in that regard. She continues  taking Ultracet and using Lidoderm patches which she states takes the edge  off of her pain and makes it bearable, but she still describes her pain as a  10/10 on a subjective scale. We have tried various other medications  including opioid analgesic which the patient really could not tolerate  secondary to side effects. She has had multiple injections into her knees as  well as her lumbar spine without any improvement. I reviewed the health and  history form and 14-point review of systems.   PHYSICAL EXAMINATION:  Reveals an obese female in no acute distress. Blood  pressure 150/52, pulse 75, respirations 16 and regular. O2 saturation is 93%  on room air. No new changes on  physical exam. She is neurologically intact  in the lower extremities. She does have increased lumbar lordosis with  tenderness to palpation in the lumbar paraspinal muscles. Range of motion of  the knees is full bilaterally with some tenderness to palpation over the  medial and lateral joint lines.   IMPRESSION:  1. Chronic low back with lumbar facet arthropathy, refractory to lumbar     facet injections.  2. Severe osteoarthritis, bilateral knees, refractory to multiple steroid     injections as well as Synvisc injections.  3. Obesity.   PLAN:  1. Discuss further treatment options with Kathleen Roy. I commend her on her     efforts to loose weight although she is struggling in this regard. I     think it is reasonably for her  to discuss possible gastric bypass with     her primary care Kathleen Roy. I informed her that loosing a significant     amount of weight would likely lessen the impact on her knees and lower     back, but it may not get rid of her pain completely. Certainly loosing     weight would not get ride of her arthritis; however, her symptoms may     improve.  2. In terms of medications, we will have her continue with Lidoderm patches.     I will also give her a trial of Darvocet-N 100 one p.o. t.i.d. as needed,     #30, without refills. The patient does pay out of pocket for her     prescriptions and cost is a concern. If symptoms are not improved with     Darvocet, we will consider switching her back to Ultracet and providing     her with some more samples. We could also consider patient assistance     program in that regard.  3. I would really like her to talk to somebody at the [facility] about getting     into the pool for some exercise.  4. The patient is to return to clinic in one month for reevaluation.   The patient was educated about findings and recommendations and understands.  There were no barriers to communication.                                                Zachary George, DO    JW/MEDQ  D:  03/22/2003  T:  03/23/2003  Job:  [id number]

## 2011-05-03 NOTE — Consult Note (Signed)
Kathleen Roy, Kathleen Roy                              ACCOUNT NO.:  [id number]   MEDICAL RECORD NO.:  [id number]                   PATIENT TYPE:   LOCATION:                                       FACILITY:   PHYSICIAN:  Zachary George, DO                      DATE OF BIRTH:  1961/10/15   DATE OF CONSULTATION:  11/22/2002  DATE OF DISCHARGE:                                   CONSULTATION   REASON FOR CONSULTATION:  The patient returns to clinic today for  reevaluation.  She was last seen on November 05, 2002.  She continues to  complain of severe low back pain and bilateral knee pain, rating it a 9/10  on a subjective scale today.  We have been treating this primarily over the  past few weeks with Lidoderm and Ultracet, which help only modestly.  She  has gotten her Medicaid card again this month and we had talked at last  visit about resuming Duragesic 25 mcg, which was helping her pain to a fair  degree.  She has received occlusive dressings through [employer]  to help with the patches sticking to her skin.  She has also had the  gynecological issues resolved in the interim.  I reviewed health and history  form and 14-point review of systems.  Function and quality-of-life indices  remain declined.   PHYSICAL EXAMINATION:  GENERAL:  Physical exam reveals an obese female in no  acute distress.  The patient had a difficult time going from sitting to  standing secondary to her pain.  VITAL SIGNS:  Blood pressure 127/61, pulse 74, respirations 16, O2  saturation 91% on room air.  BACK:  There is tenderness to palpation in bilateral lumbar paraspinal  muscles.  EXTREMITIES:  There is also tenderness to palpation in bilateral medial knee  joints.  NEUROLOGIC:  Neurologic examination is intact in the lower extremities to  motor, sensory and reflexes.   IMPRESSION:  1. Chronic low back pain with lumbar facet arthropathy.  2. Severe osteoarthritis, bilateral knees.  3. Obesity.  4.  History of depression, anxiety and panic disorder.   PLAN:  1. Will resume Duragesic 25 mcg one q.72h., #10 without refills.  2. Continue Ultracet one to two p.o. q.i.d. as needed for breakthrough pain,     #100 with 2 refills.  3. Encourage patient on a home exercise program.  4. Consider repeat lumbar facet injections, although these were only     modestly efficacious in her case.  5. Patient to return to clinic in one month for reevaluation.   Patient was educated in the above findings and recommendations and  understands.  There were no barriers to communication.  Zachary George, DO    JW/MEDQ  D:  11/22/2002  T:  11/23/2002  Job:  [id number]

## 2011-05-03 NOTE — Consult Note (Signed)
NAME:  Kathleen Roy, Kathleen Roy                            ACCOUNT NO.:  [id number]   MEDICAL RECORD NO.:  [id number]                   PATIENT TYPE:  REC   LOCATION:  [facility]                                  FACILITY:  [facility]   PHYSICIAN:  Zachary George, DO                      DATE OF BIRTH:  [birth year]   DATE OF CONSULTATION:  02/07/2003  DATE OF DISCHARGE:                                   CONSULTATION   HISTORY:  The patient returns to clinic today for reevaluation.  She was  last seen on 01/17/03.  She continues to complain of severe low-back and  bilateral knee pain secondary to osteoarthritis.  At last visit, I started  her on methadone 5 mg t.i.d.  The patient took it for about one week without  any relief, so she discontinued.  She did not notice any particular side  effects with the medications.  She still has the remaining pills at home.  Her pain today is a 10/10 on a subjective scale.  She also continues to use  Ultracet and Lidoderm which gives her modest improvement at best.  She  states it helps enough to allow her to get up to go to the bathroom, but it  really has not improved her function and quality of life indices.   I reviewed health and history form with 14-point review of systems.   PHYSICAL EXAMINATION:  GENERAL:  Exam reveals an obese female, in no acute  distress.  BACK:  Examination reveals increased lumbar lordosis with significant  tenderness to palpation bilateral lumbar paraspinals.  EXTREMITIES:  Examination of the knees reveals full range of motion  bilaterally with discomfort.  There is no effusions noted today.   IMPRESSION:  1. Chronic low-back pain with lumbar facet arthropathy, refractory to lumbar     facet injections.  2. Severe osteoarthritis bilateral knees.  3. Obesity.   PLAN:  1. Discussed further treatment options with the patient.  At this point, I     would like to increase her methadone dose as I do not think 5 mg gave her     any pain relief.   We will increase to 10 mg one p.o. q.8h.  I have     instructed the patient to discontinue the medication if she has any     adverse reaction.  She is to notify our clinic in such as case.  Continue Ultracet and Lidoderm.  We will go ahead and get her started into  the patient assistance program for both.  1. The patient is to return to clinic in two weeks for reevaluation.   The patient was educated about findings and recommendations, and  understands.  There were no barriers to communication.  Zachary George, DO    JW/MEDQ  D:  02/07/2003  T:  02/07/2003  Job:  [id number]

## 2011-05-03 NOTE — Consult Note (Signed)
NAME:  Kathleen Roy, Kathleen Roy                            ACCOUNT NO.:  [id number]   MEDICAL RECORD NO.:  [id number]                   PATIENT TYPE:  REC   LOCATION:  [facility]                                  FACILITY:  [facility]   PHYSICIAN:  Sondra Come, D.O.                 DATE OF BIRTH:  [birth year]   DATE OF CONSULTATION:  10/01/2002  DATE OF DISCHARGE:                                   CONSULTATION   HISTORY OF PRESENT ILLNESS:  The patient returns to the clinic today to  discuss MRI findings.  I last saw her on September 22, 2002, at which time I  ordered an MRI of her lumbar spine, which showed some very mild disk  dessication at L3-4 and L4-5 as well as facet arthropathy at all levels  throughout the lumbar spine.  There is no central canal and neural foraminal  stenosis.  There are no focal disk herniations or evidence of neoplasm.  The  patient continues to have significant pain in her lower back, mainly with  standing and walking.  She occasionally has significant relief with Ultracet  and Lidoderm.  However, she still complains of more bad days than good days.  Overall, her pain is an 8/10 on a subjective scale.  This involves her low  back pain and bilateral knees.  Her knees typically are not bothering her  when she is sitting or even when she is walking but really bother her when  she goes from sitting to standing.  We have discussed further treatment  options, although the patient does have some financial concerns, especially  when it comes to any type of therapy such as aquatic therapy, which I think  would benefit her greatly.  She does anticipate her Medicaid to be stopped  as she has received disability and will likely have Medicare, which should  pay for aquatic therapy for her, and we discussed this.  In addition, we  discussed possible minimally invasive interventional procedures to help with  her back pain to include lumbar facet injections diagnostically and  therapeutically.  I  reviewed the health and history form and 14-point review  of systems.   PHYSICAL EXAMINATION:  GENERAL:  Obese female in no acute distress.  VITAL SIGNS:  Blood pressure 125/63, pulse 83, respirations 20, O2  saturation 100% on room air.  NEUROLOGIC:  No new neurologic changes in the lower extremities including  motor, sensory, and reflexes.  There is tenderness to palpation bilateral  lumbar paraspinals with significant increased lumbar lordosis.   IMPRESSION:  1. Chronic low back pain with lumbar facet arthropathy, which I suspect is     contributing to a significant portion of the patient's low back pain.     She also has some mild degenerative disk changes with questionable     clinical significance.  2. Severe osteoarthritis bilateral knees.  3.  Obesity.  4. History of depression, anxiety, and panic disorder.  5. Tobacco abuse.   PLAN:  1. Again I had a long discussion with the patient regarding further     treatment options.  I think she would benefit from a trial of minimally     invasive lumbar facet injections diagnostically and therapeutically,     coupled with aquatic therapy and activity modification.  I discussed the     injections with her in detail including the risks, benefits, limitations,     and alternatives.  The patient wishes to proceed.  2. Await TENS trial.  3. Continue Lidoderm and Ultracet for now.  4. Patient to return to clinic for injections.   The patient was educated on the above findings and recommendations and  understands.  There were no barriers to communication.                                               Sondra Come, D.O.    JJW/MEDQ  D:  10/01/2002  T:  10/03/2002  Job:  [id number]   cc:   Pricilla Holm, M.D.  [facility]  [place], [place] [address]  Fax: [phone]

## 2011-05-03 NOTE — Assessment & Plan Note (Signed)
HISTORY:  The patient is back regarding her bilateral knee pain, low back  pain and morbid obesity.  She has seen Dr. Ollen Gross, who felt that she  was not a surgical candidate, and recommended weight loss.  The patient  continues to have knee and back pain.  She rates her pain as a 7/10.  She is  using Ultracet for break-through pain, as well as occasional Tylenol.  The  pain limits her standing for only a few minutes at a time.  She has a hard  time even preparing dinner.  She notes that the pain is made worse with  walking, bending, sitting too long, working, or therapy.  It improves with  rest.  Apparently Dr. Lequita Halt performed MRI's which I am not privy to at  this point.   REVIEW OF SYSTEMS:  The patient notes occasional shortness of breath and  swelling, weakness, numbness, dizziness, anxiety, depression, headaches,  constipation, urinary retention, weight gain and excessive sweating.  Appetite has been inconsistent.  She only eats really one meal a day.  She  denies snacking.  Her physical activity is limited.   PHYSICAL EXAMINATION:  VITAL SIGNS:  Blood pressure 135/59, pulse 84,  respirations 16.  She is saturating 99% on room air.  NEUROLOGIC/MUSCULOSKELETAL:  The patient is without significant change in  her body habitus.  Her knees remain slightly swollen with tenderness on  palpation.  There are no significant changes there today.  The patient walks  with an antalgic gait, apparent to either side.  She has difficulty  transferring.   ASSESSMENT:  1. 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  2. Low back pain.  3. Morbid obesity.   PLAN:  1. I think at this point the patient's only option is to consider a gastric     bypass surgery.  I will check and see the ability of this in [place] at the [facility]  2. She will continue on her Ultracet for break-through pain.  3. I will see her back on a p.r.n. basis.      Ranelle Oyster, M.D.   ZTS/MedQ  D:  02/21/2004 16:29:35  T:  02/21/2004 17:14:36  Job #:  [id number]

## 2011-05-03 NOTE — Consult Note (Signed)
NAME:  Kathleen Roy, LAUMANN                            ACCOUNT NO.:  [id number]   MEDICAL RECORD NO.:  [id number]                   PATIENT TYPE:  REC   LOCATION:  [facility]                                  FACILITY:  [facility]   PHYSICIAN:  Zachary George, DO                      DATE OF BIRTH:  12/25/1960   DATE OF CONSULTATION:  01/17/2003  DATE OF DISCHARGE:                                   CONSULTATION   REASON FOR CONSULTATION:  The patient returns to the clinic today for re-  evaluation.  She was last seen on 11/22/02.  She continues to complain of  severe back pain and bilateral knee pain secondary to osteoarthritis.  Her  pain is a 10/10 on a subjective scale today.  She continues taking Ultracet  and using Lidoderm with modest improvement.  I have given her a trial of  Duragesic 25 mcg, which she states seemed to be helping initially, but then  lost its effectiveness.  In addition, she noted a rash with the Duragesic,  and she has discontinued this.  Her function and quality of life indices  remain declined.  Her sleep is fair.  She tells me today that she basically  cannot live with this severity of pain.  Her pain fluctuates, but is severe  every day.  We have discussed other medication strategies to help control  her chronic pain, however, she will have some financial concerns in the near  future as she anticipates losing her Medicaid either at the end of this  month or the end of next month.  This will significantly limit [place] in terms  of medications, including long-acting opiate analgesics which I think are  appropriate in this case.  We discussed this at length.  I reviewed health  and history form and 14 point review of systems.  No new neurologic  complaints.   PHYSICAL EXAMINATION:  VITAL SIGNS:  Blood pressure is 138/92, pulse 80,  respirations 20, O2 saturation 93% on room air.  EXTREMITIES:  There is tenderness to palpation bilateral lumbar paraspinal  muscles with increased lumbar  lordosis.  There is significant tenderness to  palpation bilateral knees with full range of motion.  There is no heat,  erythema, or edema noted in the lower extremities at this time.  NEUROLOGIC:  Neurologic examination is intact in the lower extremities,  including motor, sensory, and reflexes.   IMPRESSION:  1. 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  2. Severe osteoarthritis of bilateral knees.  3. Obesity.   PLAN:  1. Discussed further treatment options with the patient.  I think it is     reasonable to proceed with a long-acting opiate analgesic.  Duragesic     caused a rash and this was discontinued.  We discussed long-acting  morphine preparations, although again financial concerns are an issue.     Because of this, I will start methadone 5 mg one p.o. t.i.d. p.r.n. for     pain #90 without refills.  We will monitor the patient's medication     usage.  Potential side effect profile reviewed.  The patient understands     the habituating nature of this medication.  2. Continue Lidoderm patches.  3. Continue Ultracet p.r.n.  4. The patient is to return to clinic in one month for re-evaluation.   The patient was educated on the above findings and recommendations and  understands.  There were no barriers to communication.                                              Zachary George, DO   JW/MEDQ  D:  01/17/2003  T:  01/18/2003  Job:  [id number]

## 2011-05-03 NOTE — Consult Note (Signed)
NAME:  Kathleen Roy, Kathleen Roy                            ACCOUNT NO.:  [id number]   MEDICAL RECORD NO.:  [id number]                   PATIENT TYPE:  REC   LOCATION:  [facility]                                  FACILITY:  [facility]   PHYSICIAN:  Sondra Come, D.O.                 DATE OF BIRTH:  [birth year]   DATE OF CONSULTATION:  09/22/2002  DATE OF DISCHARGE:                                   CONSULTATION   HISTORY OF PRESENT ILLNESS:  The patient returns to clinic today for  reevaluation.  She was initially seen on August 24, 2002.  She continues  to complain of bilateral knee pain greater than nonradicular low back pain.  I have given her a trial of Lidoderm patches as well as Ultracet over the  past month.  She states that her symptoms are occasionally improved with the  Ultracet which she takes six to eight tablets per day.  She also uses the  Lidoderm, which she states helps to some degree.  Most of her pain comes on  when she tries to get up from a seated position but states that once she is  up and walking around, her symptoms are significantly better especially in  the knees.  However, prolonged walking does increase her low back pain.  Her  pain typically fluctuates but she does characterize it as a 10/10 on a  subjective scale today.  She does not appear to be in any acute pain,  however.  I reviewed the health and history form and 14-point review of  systems.   PHYSICAL EXAMINATION:  GENERAL APPEARANCE:  The patient is an obese female  in no acute distress.  EXTREMITIES:  Examination of the knees reveals mild tenderness to palpation  over the medial joint lines bilaterally without any effusions.  No  ligamentous instability.  Examination of the back reveals significant  tenderness to very light palpation in the lower lumbar paraspinous muscles.  There is increased lumbar lordosis.  NEUROLOGIC:  No new neurologic findings in the lower extremities including  motor, sensory and  reflexes.   IMPRESSION:  1. Chronic low back pain, mechanical and myofascial.  Again, I suspect a     significant component of lumbar facet arthropathy contributing to her     current symptoms.  Her obesity and lower extremity inflexibility also     contribute.  2. Severe osteoarthritis bilateral knees.  3. Obesity.  4. History of depression, anxiety and panic disorder.   PLAN:  1. Discussed further medication management and possible interventional     procedures with the patient in order to help control her pain symptoms.     She does not really tolerate pain medication that well secondary to     somnolence which interferes with her functional abilities.  Ultracet does     make her a little bit sleepy but she states,  not as much as the     hydrocodone which she was previously taking.  Will have her continue with     the Ultracet.  Will also give her samples of Bextra 10 mg two p.o. q.d.     #18 pills given.  The patient was previously taking Bextra but she was     unsure of whether or not she was taking 10 or 20 mg.  2. Will obtain an open MRI of lumbar spine without contrast to evaluate     discs and facet joints.  3. Will give the patient a prescription for a TENS trial through physical     therapy.  4. Recommend Glucosamine chondroitin.  5. The patient is to return to clinic after MRI.  Would consider trial of     lumbar facet injections diagnostically and therapeutically.   The patient was educated on the above findings and recommendations and  understands.  There were no barriers to communication.                                                Sondra Come, D.O.    JJW/MEDQ  D:  09/22/2002  T:  09/22/2002  Job:  [id number]   cc:   Pricilla Holm, M.D.  [facility]  [place], [place] [address]  Fax: [phone]

## 2011-05-03 NOTE — Consult Note (Signed)
NAME:  Kathleen Roy, Kathleen Roy                            ACCOUNT NO.:  [id number]   MEDICAL RECORD NO.:  [id number]                   PATIENT TYPE:   LOCATION:                                       FACILITY:  [facility]   PHYSICIAN:  Sondra Come, D.O.                 DATE OF BIRTH:  10-29-1961   DATE OF CONSULTATION:  10/18/2002  DATE OF DISCHARGE:                                   CONSULTATION   HISTORY OF PRESENT ILLNESS:  Ms. Kathleen Roy returns to clinic today as scheduled  for evaluation. She was last seen on 10/04/2002 at which time she underwent  bilateral L4-5 and L5-S1 facet joint injections which have given her  significant improvement in her low back pain. She rates her back pain as a  4/10 on a subjective scale. She states that some days are better than  others. She mainly complains of bilateral knee pain today which she rates as  a 9/10 on a subjective scale. Ultracet and Lidoderm seem to help on occasion  but inconsistently. I reviewed the health and history form and 14-point  review of systems. She has received her TENS unit and has been using it for  about one week without any significant improvement. Ms. Kathleen Roy and I discussed  further treatment options. She has had multiple medication trials as well as  injections into her knees without any significant improvement. She states  she has been trying to loose weight, but I encouraged her in this regard  again. She continues to smoke, and we discussed this as well, and I  counseled her on the importance of smoking cessation.   PHYSICAL EXAMINATION:  Reveals an obese female in no acute distress.  Examination of the back reveals increased lumbar lordosis with tenderness to  palpation bilateral lumbar paraspinal muscles. Range of motion is guarded.  Range of motion of the knees is full bilaterally with tenderness to  palpation diffusely bilateral medial and lateral joint lines. No ligamentous  laxity noted.   IMPRESSION:  1. Severe  osteoarthritis, bilateral knees.  2. 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  3. Obesity.  4. Tobacco abuse.  5. History of depression, anxiety, and panic disorder.   PLAN:  1. Ms. Kathleen Roy and I had a long discussion regarding further treatment options.     At this time, it is reasonable to transition her over to a     pharmacologically long-acting opiate analgesic and will begin Duragesic     25 mg q.72h. #5 without refills. I discussed the potential side effect     profile. The rationale is to decrease her pain, improved her function,     which hopefully will allow her to perform aerobic-type conditioning     exercises to help reduce her weight as well as to help with smoking     cessation.  2. Continue Ultracet  one to two p.o. q.i.d. as needed for breakthrough pain     #120 without refills.  3. Discussed methods for patient to become more actively involved in her     care such as smoking cessation, aerobic activity, and weight loss.  4. The patient will return to clinic in two weeks for reevaluation.   The patient was educated about findings and recommendations and understands.  There were no barriers to communication.                                               Sondra Come, D.O.    JJW/MEDQ  D:  10/18/2002  T:  10/19/2002  Job:  [id number]   cc:   Pricilla Holm, M.D.  [facility]  [place], [place] [address]  Fax: [phone]

## 2011-05-14 ENCOUNTER — Other Ambulatory Visit: Payer: Self-pay | Admitting: Family Medicine

## 2011-05-14 NOTE — Telephone Encounter (Signed)
Refill request

## 2011-06-04 ENCOUNTER — Ambulatory Visit: Payer: Medicare Other | Admitting: Family Medicine

## 2011-06-20 ENCOUNTER — Ambulatory Visit (INDEPENDENT_AMBULATORY_CARE_PROVIDER_SITE_OTHER): Payer: Medicare Other | Admitting: Family Medicine

## 2011-06-20 ENCOUNTER — Encounter: Payer: Self-pay | Admitting: Family Medicine

## 2011-06-20 ENCOUNTER — Telehealth: Payer: Self-pay | Admitting: Family Medicine

## 2011-06-20 VITALS — BP 127/75 | HR 98 | Temp 99.3°F | Ht 62.5 in | Wt 353.0 lb

## 2011-06-20 DIAGNOSIS — Z872 Personal history of diseases of the skin and subcutaneous tissue: Secondary | ICD-10-CM | POA: Insufficient documentation

## 2011-06-20 DIAGNOSIS — L0291 Cutaneous abscess, unspecified: Secondary | ICD-10-CM

## 2011-06-20 DIAGNOSIS — L0390 Cellulitis, unspecified: Secondary | ICD-10-CM

## 2011-06-20 LAB — BASIC METABOLIC PANEL
CO2: 34 mEq/L — ABNORMAL HIGH (ref 19–32)
Calcium: 10.2 mg/dL (ref 8.4–10.5)
Creat: 0.8 mg/dL (ref 0.50–1.10)
Sodium: 140 mEq/L (ref 135–145)

## 2011-06-20 LAB — CBC WITH DIFFERENTIAL/PLATELET
Basophils Absolute: 0 10*3/uL (ref 0.0–0.1)
Basophils Relative: 0 % (ref 0–1)
Eosinophils Absolute: 0 10*3/uL (ref 0.0–0.7)
Eosinophils Relative: 0 % (ref 0–5)
Lymphs Abs: 2.1 10*3/uL (ref 0.7–4.0)
MCH: 30 pg (ref 26.0–34.0)
MCV: 93.9 fL (ref 78.0–100.0)
Neutrophils Relative %: 67 % (ref 43–77)
Platelets: 230 10*3/uL (ref 150–400)
RBC: 4.27 MIL/uL (ref 3.87–5.11)
RDW: 15.1 % (ref 11.5–15.5)

## 2011-06-20 MED ORDER — SULFAMETHOXAZOLE-TRIMETHOPRIM 800-160 MG PO TABS: 1.0000 | ORAL_TABLET | Freq: Two times a day (BID) | ORAL | Status: DC

## 2011-06-20 NOTE — Progress Notes (Signed)
  Subjective:    Patient ID: Kathleen Roy, female    DOB: [birth year], 50 y.o.   MRN: [id number]  HPI CC: Cellulitis  Kathleen Roy presents today with a 1 week h/o R leg cellulitis. She has had dermatitis in her R leg for several months which turned into an open wound after "picking" at it. The wound has grown in size and is painful and hot to the touch. Kathleen Roy experienced mailase, chills and HA on 06/18/11. Her symptoms improved by the next morning. On 06/19/11 her R abdomen becoming painful, hot and red. She states that this is exactly what happened the last time she had cellulitis and had to be admitted to the hospital.    Review of Systems  Constitutional: Positive for chills and fatigue.       Subjective fever  HENT: Negative.   Eyes: Negative.   Respiratory: Negative.   Cardiovascular: Negative.   Gastrointestinal: Negative.   Genitourinary: Negative.   Skin: Positive for rash and wound.       See HPI  Neurological: Negative.   Hematological: Negative for adenopathy.  Psychiatric/Behavioral: Negative.        Objective:   Physical Exam  Constitutional: She is oriented to person, place, and time. No distress.       Obese  Eyes: Conjunctivae and EOM are normal. Right eye exhibits no discharge. Left eye exhibits no discharge.  Neck: Normal range of motion. Neck supple. No tracheal deviation present. No thyromegaly present.  Cardiovascular: Normal rate, regular rhythm, normal heart sounds and intact distal pulses.  Exam reveals no gallop and no friction rub.   No murmur heard. Pulmonary/Chest: Effort normal and breath sounds normal. No stridor. No respiratory distress. She has no wheezes. She has no rales.  Abdominal: Soft. Bowel sounds are normal. There is tenderness.  Musculoskeletal:       Impaired ROM due to body habitus  Lymphadenopathy:    She has no cervical adenopathy.  Neurological: She is alert and oriented to person, place, and time.  Skin: She is not diaphoretic.   Significant cellulitis area on the R abdomen w/o open wounds. 2 healing scabs that do not appear infected. R leg with 5x8cm open lesion surrounded by erythematous skin. Significant vascular dermatitis of the lower extremity           Assessment & Plan:  Kathleen Roy is a 50yo female with a complex medical history with multiple regions of cellulitis  1. Cellulitis - Borders clearly marked for referencing. Started on Septra DS BID for 10 days. Pt instructed to return tomorrow for reevaluation. Pt also instructed to go to the ED if symptoms worsen. Pt also instructed to use Mupirocin ointment on R leg wound. BMP and CBC w/ diff drawn as baseline due to prior history or ICU admission after contracting cellulitis.   2. HA - Kathleen Roy requested fioricet for migraine HA which she gets once to twice a year. Advised to try OTC first such as Ibuprofen or Excedrin.

## 2011-06-20 NOTE — Telephone Encounter (Signed)
Patient has been treated for cellulitis before.  Symptoms have worsened over the past 24 hours.  Explained that Kathleen Roy is not here today and since she has not been seen since February she would need an appointment.  Gave ger a 3:30 appointment for this afternoon.

## 2011-06-20 NOTE — Telephone Encounter (Signed)
Patient is complaining of possible cellutlitus and was encouraged to make an appt.  She has an appt for 7/10 but is concerned that if she waits that long, she will end up back in the hospital again.  She states her belly is blood red and hot.  She was requesting to speak to Luretha Murphy about refills for meds she tool last year, Suprax and Sulfamethoxasole.

## 2011-06-20 NOTE — Patient Instructions (Addendum)
Thank you for coming to the clinic today. Please take the Septra as instructed and apply the mupirocin to the affected area of the R leg with daily cleaning and washing of the area. The antibiotics should start working quickly and you should notice regression of the red skin areas within the line drawn around it tonight. Please return tomorrow for reevaluation of the cellulitis. If symptoms worsen please go to the Emergency room. Try Ibuprofen or Excedrin the next time you have a headache.

## 2011-06-21 ENCOUNTER — Ambulatory Visit: Payer: Medicare Other | Admitting: Family Medicine

## 2011-06-23 LAB — WOUND CULTURE
Gram Stain: NONE SEEN
Gram Stain: NONE SEEN
Gram Stain: NONE SEEN
Organism ID, Bacteria: NO GROWTH

## 2011-06-25 ENCOUNTER — Encounter: Payer: Self-pay | Admitting: Family Medicine

## 2011-06-25 ENCOUNTER — Ambulatory Visit (INDEPENDENT_AMBULATORY_CARE_PROVIDER_SITE_OTHER): Payer: Medicare Other | Admitting: Family Medicine

## 2011-06-25 VITALS — BP 99/64 | HR 85 | Temp 98.5°F | Ht 62.2 in | Wt 353.0 lb

## 2011-06-25 DIAGNOSIS — G43909 Migraine, unspecified, not intractable, without status migrainosus: Secondary | ICD-10-CM

## 2011-06-25 DIAGNOSIS — R251 Tremor, unspecified: Secondary | ICD-10-CM

## 2011-06-25 DIAGNOSIS — R259 Unspecified abnormal involuntary movements: Secondary | ICD-10-CM

## 2011-06-25 DIAGNOSIS — L0390 Cellulitis, unspecified: Secondary | ICD-10-CM

## 2011-06-25 DIAGNOSIS — I10 Essential (primary) hypertension: Secondary | ICD-10-CM

## 2011-06-25 DIAGNOSIS — L0291 Cutaneous abscess, unspecified: Secondary | ICD-10-CM

## 2011-06-25 MED ORDER — BUTALBITAL-APAP-CAFFEINE 50-325-40 MG PO TABS: 1.0000 | ORAL_TABLET | Freq: Two times a day (BID) | ORAL | Status: DC | PRN

## 2011-06-25 MED ORDER — MUPIROCIN 2 % EX OINT: TOPICAL_OINTMENT | Freq: Three times a day (TID) | CUTANEOUS | Status: DC

## 2011-06-25 MED ORDER — SULFAMETHOXAZOLE-TRIMETHOPRIM 800-160 MG PO TABS: ORAL_TABLET | ORAL | Status: DC

## 2011-06-25 NOTE — Patient Instructions (Addendum)
Aspirin Cream (aspercream) Tremor-could be related to your meds,  Fioricet for migraines Complete 2 more weeks of high dose Bactrim 2 tabs twice daily and use Bactroban ointment on your sores; stop doxycycline Use your brain Hold your lisinopril_BP is too low

## 2011-06-25 NOTE — Assessment & Plan Note (Signed)
Will prescribe Fioricet, she is to use carefully.  She is on so many medications.  She feels terrible in general secondary to her morbid obesity and poverty.

## 2011-06-25 NOTE — Assessment & Plan Note (Signed)
Improving on higher than prescribed dosage of Bactrim DS 2 bid, will continue this dosage for 2 more weeks.  She is to stop the doxycycline as she apparently developed resistance.  Culture was negative, has been MRSA in the past and likely this time.  Add Bactroban ointment where she can reach.

## 2011-06-25 NOTE — Assessment & Plan Note (Signed)
Has been complaining of increasing tremor for a while, suspect related to serotonergic medications, hard to sort out, present at both rest and movement.  She denies familial tremor in family.  Would consider treating with beta blocker but today had to discontinue lisinopril because of low BP.

## 2011-06-25 NOTE — Progress Notes (Signed)
  Subjective:    Patient ID: Kathleen Roy, female    DOB: 1960/12/25, 50 y.o.   MRN: [id number]  HPI One week follow up for cellulitis of panus and right LE, started on Bactrim DS one bid, but she knew from the past that she needed 2 bid for 2 weeks so took at a higher dosage and is almost out but knew she was coming in.  Unfortunately this occurred while on doxycycline for prophylaxis.  BP is low, she is a little off balance, no longer having chills, and redness is decreasing. She reports pustules in between her legs again and this proceeded the cellulitis.  This was a pattern that lead her to an ICU stay for septicemia.   Developing a tremor of upper extremities, seems to be getting worse.  Memory problems retrieving words.  Migraine medications have all been discontinued and she talked to the pharmacist and he recommended Fioricet.   Review of Systems  Constitutional: Positive for fatigue. Negative for fever and chills.  HENT: Negative for hearing loss.   Respiratory: Negative for shortness of breath.   Cardiovascular: Positive for leg swelling.  Musculoskeletal:       Right hand pain  Neurological: Positive for tremors and headaches. Negative for seizures and light-headedness.  Psychiatric/Behavioral: Positive for dysphoric mood.       Objective:   Physical Exam  Constitutional:       Morbidly obese. Usual appearing.  Cardiovascular: Normal rate, regular rhythm and normal heart sounds.        BP low  Pulmonary/Chest: Effort normal and breath sounds normal.  Musculoskeletal:       Right hand with excellent strength, full ROM.  Neurological:       Able to remember 2 objects in 10 minutes after being distracted. Tremor at rest and with movement, bilateral,  Skin:       Induration and redness of the panus and right leg is reduced from described, there is no extension.  She was unable to show me the pustules and she is morbidly obese and cannot get onto a table.  I have seen them in  the past, she is improving, WBC count was 7.7.          Assessment & Plan:

## 2011-06-25 NOTE — Assessment & Plan Note (Signed)
BP low today, has had problems in the past with hypotension during infections, hold lisinopril for now.

## 2011-06-26 ENCOUNTER — Telehealth: Payer: Self-pay | Admitting: Family Medicine

## 2011-06-26 NOTE — Telephone Encounter (Signed)
Pt states she did not get the butalbital-acetaminophen-caffeine - [facility]- [address] Was not at the pharmacy and she was not given script

## 2011-06-26 NOTE — Telephone Encounter (Signed)
Called in.

## 2011-06-26 NOTE — Telephone Encounter (Signed)
It is documented that the Rx was printed.  Will forward to NP for further assistance [place], [employer]

## 2011-07-14 ENCOUNTER — Other Ambulatory Visit: Payer: Self-pay | Admitting: Family Medicine

## 2011-07-14 NOTE — Telephone Encounter (Signed)
Refill request

## 2011-07-25 ENCOUNTER — Telehealth: Payer: Self-pay | Admitting: Family Medicine

## 2011-07-25 DIAGNOSIS — M549 Dorsalgia, unspecified: Secondary | ICD-10-CM

## 2011-07-25 NOTE — Telephone Encounter (Signed)
Needs to talk to Darl Pikes about medication- Tresa Garter- has some questions pls call after 12pm

## 2011-08-01 ENCOUNTER — Other Ambulatory Visit: Payer: Self-pay | Admitting: Family Medicine

## 2011-08-01 DIAGNOSIS — M545 Low back pain, unspecified: Secondary | ICD-10-CM

## 2011-08-01 NOTE — Telephone Encounter (Signed)
Severe low back pain, will refer to home health PT; patient is morbidly obese and confined to the bed.

## 2011-08-08 NOTE — Progress Notes (Signed)
Addended by: Zachery Dauer on: 08/08/2011 05:30 PM   Modules accepted: Orders

## 2011-08-09 ENCOUNTER — Encounter: Payer: Self-pay | Admitting: Family Medicine

## 2011-08-09 ENCOUNTER — Other Ambulatory Visit: Payer: Self-pay | Admitting: Family Medicine

## 2011-09-04 ENCOUNTER — Telehealth: Payer: Self-pay | Admitting: Family Medicine

## 2011-09-04 NOTE — Telephone Encounter (Signed)
Please call patient back after 12:00 noon regarding changing provider.

## 2011-09-05 ENCOUNTER — Other Ambulatory Visit: Payer: Self-pay | Admitting: Family Medicine

## 2011-09-05 ENCOUNTER — Encounter: Payer: Self-pay | Admitting: Family Medicine

## 2011-09-05 DIAGNOSIS — L0390 Cellulitis, unspecified: Secondary | ICD-10-CM

## 2011-09-05 MED ORDER — TRAMADOL HCL 50 MG PO TABS: ORAL_TABLET | ORAL | Status: DC

## 2011-09-05 MED ORDER — ALBUTEROL SULFATE HFA 108 (90 BASE) MCG/ACT IN AERS: 2.0000 | INHALATION_SPRAY | Freq: Four times a day (QID) | RESPIRATORY_TRACT | Status: DC | PRN

## 2011-09-05 MED ORDER — SULFAMETHOXAZOLE-TRIMETHOPRIM 800-160 MG PO TABS: ORAL_TABLET | ORAL | Status: DC

## 2011-09-05 NOTE — Telephone Encounter (Signed)
Will assign to Dr. Earnest Bailey  Developing bumps on her legs again (has recurrent folliculitis that developers to cellulitis and once she had sepsis and required an ICU stay. 2 weeks of Bactrim DS 2 bid

## 2011-09-11 ENCOUNTER — Other Ambulatory Visit: Payer: Self-pay | Admitting: Family Medicine

## 2011-09-11 NOTE — Telephone Encounter (Signed)
Refill request

## 2011-09-12 MED ORDER — PREGABALIN 75 MG PO CAPS: 75.0000 mg | ORAL_CAPSULE | Freq: Two times a day (BID) | ORAL | Status: DC

## 2011-09-24 ENCOUNTER — Ambulatory Visit: Payer: Medicare Other | Admitting: Family Medicine

## 2011-10-09 ENCOUNTER — Other Ambulatory Visit: Payer: Self-pay | Admitting: Family Medicine

## 2011-10-09 NOTE — Telephone Encounter (Signed)
Refill request

## 2011-10-15 ENCOUNTER — Ambulatory Visit (INDEPENDENT_AMBULATORY_CARE_PROVIDER_SITE_OTHER): Payer: Medicare Other | Admitting: Family Medicine

## 2011-10-15 ENCOUNTER — Encounter: Payer: Self-pay | Admitting: Family Medicine

## 2011-10-15 DIAGNOSIS — L538 Other specified erythematous conditions: Secondary | ICD-10-CM

## 2011-10-15 DIAGNOSIS — M545 Low back pain, unspecified: Secondary | ICD-10-CM

## 2011-10-15 DIAGNOSIS — Z872 Personal history of diseases of the skin and subcutaneous tissue: Secondary | ICD-10-CM

## 2011-10-15 DIAGNOSIS — G629 Polyneuropathy, unspecified: Secondary | ICD-10-CM | POA: Insufficient documentation

## 2011-10-15 DIAGNOSIS — G609 Hereditary and idiopathic neuropathy, unspecified: Secondary | ICD-10-CM

## 2011-10-15 DIAGNOSIS — F410 Panic disorder [episodic paroxysmal anxiety] without agoraphobia: Secondary | ICD-10-CM

## 2011-10-15 DIAGNOSIS — E785 Hyperlipidemia, unspecified: Secondary | ICD-10-CM

## 2011-10-15 DIAGNOSIS — M25569 Pain in unspecified knee: Secondary | ICD-10-CM

## 2011-10-15 DIAGNOSIS — M542 Cervicalgia: Secondary | ICD-10-CM

## 2011-10-15 DIAGNOSIS — J449 Chronic obstructive pulmonary disease, unspecified: Secondary | ICD-10-CM | POA: Insufficient documentation

## 2011-10-15 DIAGNOSIS — I10 Essential (primary) hypertension: Secondary | ICD-10-CM

## 2011-10-15 DIAGNOSIS — R609 Edema, unspecified: Secondary | ICD-10-CM

## 2011-10-15 DIAGNOSIS — G43909 Migraine, unspecified, not intractable, without status migrainosus: Secondary | ICD-10-CM

## 2011-10-15 NOTE — Assessment & Plan Note (Signed)
Currently doing well with as needed albuterol

## 2011-10-15 NOTE — Patient Instructions (Signed)
It was very nice to meet you today  Please make follow-up and we will get fasting labs  At your follow-up please bring a list of pain medication and treatments.  Will make you a referral for podiatry

## 2011-10-15 NOTE — Assessment & Plan Note (Addendum)
Managed by Tresa Garter, lyrica, tramadol, acetaminophen.  She feels this is no longer effective.  In the past knee pain had superceded back pain, but now back pain is worsening.    Has tried hydrocodone (did not like sedation and constipation), meloxicam and celebrex not effective.  Has also seen orthopedics who recommended weight loss, has been to a pain clinic whom she says sated they had nothing further to offer.  We discussed approach to chronic pain, and her choice of care in a specialty pain clinic vs here at [facility].  She states that she would rather be seen here despite expertise of pain clinic.  We discussed that I would be willing to review her chart and fully evaluate and address her chronic pain at next visit at her convenience.

## 2011-10-16 NOTE — Assessment & Plan Note (Signed)
Will refer to podiatry- at the least, shoes for foot protection will help reduce risk of infection and support may offer some help with foot pain.  I hope having adequate footwear may reduce one barrier being more active, leaving her house.

## 2011-10-16 NOTE — Assessment & Plan Note (Signed)
Is due for fasting labs, asked her to come fasting to next appointment.

## 2011-10-16 NOTE — Progress Notes (Signed)
  Subjective:    Patient ID: Kathleen Roy, female    DOB: Oct 17, 1961, 50 y.o.   MRN: [id number]  HPI Patient here for appointment today to meed new MD, was seeing Kathleen Roy who is now gone.  We discussed her quality of life- states that it is very low.  She recently started on abilify from mental health because she had gotten so depressed while already on other medications.  Because of chronic pain and obesity, she has great difficulty ambulating.  She therefore is basically confined to her home except for doctors appointments.  She wanted me to understand that as she has a great deal of pain and no suitable footwear, she frequently cancels appointments due to rain or snow.  Her brother Kathleen Roy is with her today- he lives close by her and helps her with shopping and chores.  She states neither of them have gotten married or had children and are very close.  We spent the visit reviewing her medication list and her problem list and discussing her history of each condition.  This is documented under the problem list.  I spent >25 minutes with the patient, over 50% of which was spent in counseling and/or coordination of care as noted.     Review of Systems See HPI    Objective:   Physical Exam GEN: Alert & Oriented, No acute distress.  Tearful at times, morbidly obese. CV:  distant Respiratory:  distant Abd:  + BS, soft, no tenderness to palpation.  Large pannus, she did not want me to lift to look beneath but no obvious maceration or cellulitis surrounding Ext: profound edema and lipedema on feet and legs.  Some evidence of previous chronic venous stasis skin changes on legs, but no skin breakdown today. Feet:  Extensive edema and lipidemia- unable to fit into shoes- wearing house booties.  No skin breakdown.  No sensation to monofilament on soles of feet.       Assessment & Plan:

## 2011-10-16 NOTE — Assessment & Plan Note (Signed)
Well controlled today.

## 2011-10-17 ENCOUNTER — Telehealth: Payer: Self-pay | Admitting: *Deleted

## 2011-10-17 NOTE — Telephone Encounter (Signed)
Scheduled appointment for patient with [facility] for 10/22/2011 at 1:30. Called to notify patient of appointment and she said she would not be able to make that appointment. I gave her the number to call and reschedule for a more convenient time.Addalynn Kumari, Rodena Medin

## 2011-10-24 ENCOUNTER — Other Ambulatory Visit: Payer: Self-pay | Admitting: Family Medicine

## 2011-10-24 DIAGNOSIS — M545 Low back pain: Secondary | ICD-10-CM

## 2011-10-24 NOTE — Telephone Encounter (Signed)
Kathleen Roy is calling because she is having Chronic Pain and now she has pulled something her back and she can't even walk without screaming.  She would like to speak to Dr. Earnest Bailey to call her and talk about something for her pain.

## 2011-10-25 MED ORDER — METAXALONE 800 MG PO TABS: 800.0000 mg | ORAL_TABLET | Freq: Three times a day (TID) | ORAL | Status: DC

## 2011-10-25 NOTE — Assessment & Plan Note (Signed)
Will switch from soma to skelaxin.  She will also try Aspercreme which she bought OTC.

## 2011-10-25 NOTE — Telephone Encounter (Signed)
Patient feels she pulled a muscle in her right back a few days ago.  Between her waist and gluteal cleft.  States she has gone without eating because she could not walk to the refrigerator.  Took some Soma with little relief.  No bowel or bladder incontinence or retention.  No LE weakness change than usual.

## 2011-11-06 ENCOUNTER — Other Ambulatory Visit: Payer: Self-pay | Admitting: Family Medicine

## 2011-11-06 NOTE — Telephone Encounter (Signed)
Refill request

## 2011-12-06 ENCOUNTER — Other Ambulatory Visit: Payer: Self-pay | Admitting: Family Medicine

## 2011-12-06 NOTE — Telephone Encounter (Signed)
Refill request

## 2011-12-24 ENCOUNTER — Telehealth: Payer: Self-pay | Admitting: Family Medicine

## 2011-12-24 ENCOUNTER — Ambulatory Visit: Payer: Medicare Other | Admitting: Family Medicine

## 2011-12-24 NOTE — Telephone Encounter (Signed)
Pt had an appt today with [facility] and has diarrhea and cannot come.  This happens more times than not and would like to talk to nurse about what to do and how to change this.

## 2011-12-24 NOTE — Telephone Encounter (Signed)
Spoke with patient.  States she continues to have problems getting to appointments due to panic and also debilitation states it takes her 3 hours to get out of the house.  Feels she can only make 1 appointment per week.  Is planning on following up with her mental health provider.  Let her know I appreciated her calling and that I would be happy to see her any time and if she felt she was having a good week, to call and go ahead and check on my availability.

## 2011-12-24 NOTE — Telephone Encounter (Signed)
Spoke with patient and she just wants to get a message to Dr. Earnest Bailey because she has had to cancel a couple of appointment recently.  She doesn't want Dr. Earnest Bailey to think she doesn't want to come in.  She thinks maybe it is due to anxiety.  She gets scared when she has to go out of her house. She is afraid to go down her steps and also because of her condition it takes her a long time to get ready. She develops diarrhea that will last a few hours then goes away and she is unable to leave house due to diarrhea. Advised will send message to MD..

## 2012-01-14 DIAGNOSIS — R609 Edema, unspecified: Secondary | ICD-10-CM | POA: Diagnosis not present

## 2012-01-14 DIAGNOSIS — M79609 Pain in unspecified limb: Secondary | ICD-10-CM | POA: Diagnosis not present

## 2012-01-14 DIAGNOSIS — G589 Mononeuropathy, unspecified: Secondary | ICD-10-CM | POA: Diagnosis not present

## 2012-03-04 ENCOUNTER — Other Ambulatory Visit: Payer: Self-pay | Admitting: Family Medicine

## 2012-03-05 ENCOUNTER — Telehealth: Payer: Self-pay | Admitting: Family Medicine

## 2012-03-05 NOTE — Telephone Encounter (Signed)
Pt need to discuss with you reason why she's has been unable to see you for follow up appts.  Please call her back as she need to discuss this with you.

## 2012-03-05 NOTE — Telephone Encounter (Signed)
Patient called to let me know that she has made progress- went to foot doctor and feels she has a 50/50% chance of getting custom orthotics covered.  Has had weather and physical limitations continue to be a barrier in coming in for an office appointment with me.  Agrede to refill torsemide and lyrica and will follow-up in person at her next availability.

## 2012-03-16 ENCOUNTER — Telehealth: Payer: Self-pay | Admitting: Family Medicine

## 2012-03-16 NOTE — Telephone Encounter (Signed)
Patient is calling because she thinks she is getting cellulitus again and would like to speak to Dr. Earnest Bailey.

## 2012-03-17 NOTE — Telephone Encounter (Signed)
Informed pt of Dr. Leonie Green concern. And told her that to be sure that she follows up with Jaynee Eagles on tomorrow and that she makes an appt ASAP. Pt voiced understanding and will make every effort to comply.Loralee Pacas Sena

## 2012-03-17 NOTE — Telephone Encounter (Signed)
Called pt to obtain more information. Pt stated that she having a headache that comes and goes and she is taking Fiorecet for this and her stomach is red but not firey red its been like that since yesterday. When she had this before she stated that Darl Pikes would call something in for her due to her disability. She started taking Septra-DS 800-160 mg  Yesterday she had some left over from a previous script. She just wanted to let Dr. Earnest Bailey know what is going on and that it may come a time that she will require a refill on this medication.  She has been keeping a check on her BP since this has been going on last night it was 139/74 and she was concerned about the systolic number. I told her that its not bad but to continue to monitor it. I asked her if she has an upcoming appt she said that she did not and that she will need to go to [facility] first to get re-certified

## 2012-03-17 NOTE — Telephone Encounter (Signed)
Please tell her I am concerned with symptoms and if worsens it is time to see her in the office to make sure she is getting the appropriate care.

## 2012-03-31 ENCOUNTER — Other Ambulatory Visit: Payer: Self-pay | Admitting: Family Medicine

## 2012-04-02 ENCOUNTER — Other Ambulatory Visit: Payer: Self-pay | Admitting: Family Medicine

## 2012-04-06 ENCOUNTER — Other Ambulatory Visit: Payer: Self-pay | Admitting: Family Medicine

## 2012-04-07 DIAGNOSIS — F410 Panic disorder [episodic paroxysmal anxiety] without agoraphobia: Secondary | ICD-10-CM | POA: Diagnosis not present

## 2012-04-07 DIAGNOSIS — IMO0002 Reserved for concepts with insufficient information to code with codable children: Secondary | ICD-10-CM | POA: Diagnosis not present

## 2012-05-06 ENCOUNTER — Other Ambulatory Visit: Payer: Self-pay | Admitting: Family Medicine

## 2012-05-12 ENCOUNTER — Encounter: Payer: Self-pay | Admitting: Family Medicine

## 2012-05-12 ENCOUNTER — Ambulatory Visit (INDEPENDENT_AMBULATORY_CARE_PROVIDER_SITE_OTHER): Payer: Medicare Other | Admitting: Family Medicine

## 2012-05-12 VITALS — BP 144/82 | HR 100 | Temp 97.1°F | Ht 62.0 in | Wt 347.7 lb

## 2012-05-12 DIAGNOSIS — E785 Hyperlipidemia, unspecified: Secondary | ICD-10-CM

## 2012-05-12 DIAGNOSIS — R609 Edema, unspecified: Secondary | ICD-10-CM

## 2012-05-12 DIAGNOSIS — Z872 Personal history of diseases of the skin and subcutaneous tissue: Secondary | ICD-10-CM

## 2012-05-12 DIAGNOSIS — M25519 Pain in unspecified shoulder: Secondary | ICD-10-CM

## 2012-05-12 DIAGNOSIS — G609 Hereditary and idiopathic neuropathy, unspecified: Secondary | ICD-10-CM | POA: Diagnosis not present

## 2012-05-12 DIAGNOSIS — G629 Polyneuropathy, unspecified: Secondary | ICD-10-CM

## 2012-05-12 LAB — TSH: TSH: 1 u[IU]/mL (ref 0.350–4.500)

## 2012-05-12 MED ORDER — POTASSIUM CHLORIDE CRYS ER 20 MEQ PO TBCR: 40.0000 meq | EXTENDED_RELEASE_TABLET | Freq: Every day | ORAL | Status: DC

## 2012-05-12 MED ORDER — SULFAMETHOXAZOLE-TMP DS 800-160 MG PO TABS: 2.0000 | ORAL_TABLET | Freq: Two times a day (BID) | ORAL | Status: DC

## 2012-05-12 MED ORDER — BUTALBITAL-APAP-CAFFEINE 50-500-40 MG PO TABS: 1.0000 | ORAL_TABLET | ORAL | Status: AC | PRN

## 2012-05-12 MED ORDER — PREGABALIN 150 MG PO CAPS: 150.0000 mg | ORAL_CAPSULE | Freq: Two times a day (BID) | ORAL | Status: DC

## 2012-05-12 MED ORDER — TIZANIDINE HCL 4 MG PO TABS: 4.0000 mg | ORAL_TABLET | Freq: Three times a day (TID) | ORAL | Status: AC | PRN

## 2012-05-12 NOTE — Patient Instructions (Signed)
Will order home physical therapy for your shoulder  Follow-up in 6 months or sooner if needed

## 2012-05-13 DIAGNOSIS — M25519 Pain in unspecified shoulder: Secondary | ICD-10-CM | POA: Insufficient documentation

## 2012-05-13 LAB — COMPREHENSIVE METABOLIC PANEL
ALT: 11 U/L (ref 0–35)
AST: 13 U/L (ref 0–37)
Albumin: 4.2 g/dL (ref 3.5–5.2)
CO2: 41 mEq/L — ABNORMAL HIGH (ref 19–32)
Calcium: 9.8 mg/dL (ref 8.4–10.5)
Chloride: 91 mEq/L — ABNORMAL LOW (ref 96–112)
Potassium: 3.2 mEq/L — ABNORMAL LOW (ref 3.5–5.3)
Sodium: 142 mEq/L (ref 135–145)
Total Protein: 7.2 g/dL (ref 6.0–8.3)

## 2012-05-13 LAB — LIPID PANEL
Cholesterol: 148 mg/dL (ref 0–200)
Total CHOL/HDL Ratio: 3.2 Ratio

## 2012-05-13 MED ORDER — POTASSIUM CHLORIDE CRYS ER 20 MEQ PO TBCR: 40.0000 meq | EXTENDED_RELEASE_TABLET | Freq: Every day | ORAL | Status: DC

## 2012-05-13 NOTE — Assessment & Plan Note (Signed)
Continues to have low locus of control on her ability to make changes.

## 2012-05-13 NOTE — Assessment & Plan Note (Signed)
Will increase lyrica from 75 bid to 150 bid.  Would like for her to have better footwear but podiatry did not feel custom footwear would be covered.

## 2012-05-13 NOTE — Progress Notes (Signed)
  Subjective:    Patient ID: Kathleen Roy, female    DOB: May 31, 1961, 51 y.o.   MRN: [id number]  HPIhere for routine follow-up  Obesity:  Rarely eats, is mostly home bound.  Relies on brother for care and to help her get dressed.  Chronic pain/peripheral neuropathy:  continues with chronic pain.  Feels does not help, but if she stops, feels pain is worse.  Taking metaxolone qhs with some relief, has to switch due to insurance formulary change  Migraine; rare use, ok with prn monthly fiorcet  Candidal intertrigo- currently non inflamed.  Keeps area clean with nystatin powder.  Shoulder pain:  New left shoulder pain.  Most pain when ifting above head.  No pain when working an neutral position.  Has not had trouble in past before with shoulder.  No trauma.  Is mostly sedentary and home bound due to obesity. Review of Systemssee HPI     Objective:   Physical Exam GEN: Alert & Oriented, No acute distress CV:  Regular Rate & Rhythm, no murmur Respiratory:  Normal work of breathing, CTAB Abd:  + BS, soft, no tenderness to palpation.  Large pannus with no acute cellultis or maceration.   Ext: trace pretibial edema.  Wearing house slippers.        Assessment & Plan:

## 2012-05-13 NOTE — Assessment & Plan Note (Signed)
Left shoulder pain, most consistant with rotator cuff tendinopathy.  She would not likely be a surgical candidate.  Mostly homebound.  I did not attempt injection as I am unsure if I would be able to get in correct site to be therapeutic based on body habitus.  Will refer for home physical therapy to help her retain function, prevent frozen shoulder.

## 2012-05-13 NOTE — Assessment & Plan Note (Signed)
Will decrease on hand prn therapy to 10 days from 14 days previously prescribed by susan saxon.  Patient has difficulty with mobility, necessitating having medications handy.  She estimates she uses it 3-4 times yearly.  Discussed my lower comfort level with long course empiric treatment of cellulitis.  Advised for now, will continue to terat prn with 10 days of bactrim, if cellulitis episode persist, will need to come into office to be evaluated.

## 2012-05-13 NOTE — Assessment & Plan Note (Signed)
Will check FLP today.   

## 2012-05-13 NOTE — Assessment & Plan Note (Addendum)
Will check electrolytes, conitune current regimen  Update:  K low, will increase from 40 meq to 60 meq daily

## 2012-05-15 DIAGNOSIS — M25519 Pain in unspecified shoulder: Secondary | ICD-10-CM | POA: Diagnosis not present

## 2012-05-15 DIAGNOSIS — M549 Dorsalgia, unspecified: Secondary | ICD-10-CM | POA: Diagnosis not present

## 2012-05-15 DIAGNOSIS — R609 Edema, unspecified: Secondary | ICD-10-CM | POA: Diagnosis not present

## 2012-05-15 DIAGNOSIS — G8929 Other chronic pain: Secondary | ICD-10-CM | POA: Diagnosis not present

## 2012-05-15 DIAGNOSIS — F329 Major depressive disorder, single episode, unspecified: Secondary | ICD-10-CM | POA: Diagnosis not present

## 2012-05-15 DIAGNOSIS — I10 Essential (primary) hypertension: Secondary | ICD-10-CM | POA: Diagnosis not present

## 2012-05-20 DIAGNOSIS — I10 Essential (primary) hypertension: Secondary | ICD-10-CM | POA: Diagnosis not present

## 2012-05-20 DIAGNOSIS — M549 Dorsalgia, unspecified: Secondary | ICD-10-CM | POA: Diagnosis not present

## 2012-05-20 DIAGNOSIS — R609 Edema, unspecified: Secondary | ICD-10-CM | POA: Diagnosis not present

## 2012-05-20 DIAGNOSIS — G8929 Other chronic pain: Secondary | ICD-10-CM | POA: Diagnosis not present

## 2012-05-20 DIAGNOSIS — M25519 Pain in unspecified shoulder: Secondary | ICD-10-CM | POA: Diagnosis not present

## 2012-05-20 DIAGNOSIS — F329 Major depressive disorder, single episode, unspecified: Secondary | ICD-10-CM | POA: Diagnosis not present

## 2012-05-22 DIAGNOSIS — I10 Essential (primary) hypertension: Secondary | ICD-10-CM | POA: Diagnosis not present

## 2012-05-22 DIAGNOSIS — G8929 Other chronic pain: Secondary | ICD-10-CM | POA: Diagnosis not present

## 2012-05-22 DIAGNOSIS — F329 Major depressive disorder, single episode, unspecified: Secondary | ICD-10-CM | POA: Diagnosis not present

## 2012-05-22 DIAGNOSIS — R609 Edema, unspecified: Secondary | ICD-10-CM | POA: Diagnosis not present

## 2012-05-22 DIAGNOSIS — M549 Dorsalgia, unspecified: Secondary | ICD-10-CM | POA: Diagnosis not present

## 2012-05-22 DIAGNOSIS — M25519 Pain in unspecified shoulder: Secondary | ICD-10-CM | POA: Diagnosis not present

## 2012-05-25 DIAGNOSIS — G8929 Other chronic pain: Secondary | ICD-10-CM | POA: Diagnosis not present

## 2012-05-25 DIAGNOSIS — M549 Dorsalgia, unspecified: Secondary | ICD-10-CM | POA: Diagnosis not present

## 2012-05-25 DIAGNOSIS — I10 Essential (primary) hypertension: Secondary | ICD-10-CM | POA: Diagnosis not present

## 2012-05-25 DIAGNOSIS — R609 Edema, unspecified: Secondary | ICD-10-CM | POA: Diagnosis not present

## 2012-05-25 DIAGNOSIS — M25519 Pain in unspecified shoulder: Secondary | ICD-10-CM | POA: Diagnosis not present

## 2012-05-25 DIAGNOSIS — F329 Major depressive disorder, single episode, unspecified: Secondary | ICD-10-CM | POA: Diagnosis not present

## 2012-05-28 DIAGNOSIS — M25519 Pain in unspecified shoulder: Secondary | ICD-10-CM | POA: Diagnosis not present

## 2012-05-28 DIAGNOSIS — M549 Dorsalgia, unspecified: Secondary | ICD-10-CM | POA: Diagnosis not present

## 2012-05-28 DIAGNOSIS — I10 Essential (primary) hypertension: Secondary | ICD-10-CM | POA: Diagnosis not present

## 2012-05-28 DIAGNOSIS — F329 Major depressive disorder, single episode, unspecified: Secondary | ICD-10-CM | POA: Diagnosis not present

## 2012-05-28 DIAGNOSIS — R609 Edema, unspecified: Secondary | ICD-10-CM | POA: Diagnosis not present

## 2012-05-28 DIAGNOSIS — G8929 Other chronic pain: Secondary | ICD-10-CM | POA: Diagnosis not present

## 2012-06-03 DIAGNOSIS — F329 Major depressive disorder, single episode, unspecified: Secondary | ICD-10-CM | POA: Diagnosis not present

## 2012-06-03 DIAGNOSIS — M25519 Pain in unspecified shoulder: Secondary | ICD-10-CM | POA: Diagnosis not present

## 2012-06-03 DIAGNOSIS — M549 Dorsalgia, unspecified: Secondary | ICD-10-CM | POA: Diagnosis not present

## 2012-06-03 DIAGNOSIS — G8929 Other chronic pain: Secondary | ICD-10-CM | POA: Diagnosis not present

## 2012-06-03 DIAGNOSIS — R609 Edema, unspecified: Secondary | ICD-10-CM | POA: Diagnosis not present

## 2012-06-03 DIAGNOSIS — I10 Essential (primary) hypertension: Secondary | ICD-10-CM | POA: Diagnosis not present

## 2012-06-06 ENCOUNTER — Other Ambulatory Visit: Payer: Self-pay | Admitting: Family Medicine

## 2012-07-05 ENCOUNTER — Other Ambulatory Visit: Payer: Self-pay | Admitting: Family Medicine

## 2012-07-06 DIAGNOSIS — IMO0002 Reserved for concepts with insufficient information to code with codable children: Secondary | ICD-10-CM | POA: Diagnosis not present

## 2012-07-06 DIAGNOSIS — F410 Panic disorder [episodic paroxysmal anxiety] without agoraphobia: Secondary | ICD-10-CM | POA: Diagnosis not present

## 2012-08-01 ENCOUNTER — Other Ambulatory Visit: Payer: Self-pay | Admitting: Family Medicine

## 2012-08-19 ENCOUNTER — Telehealth: Payer: Self-pay | Admitting: Family Medicine

## 2012-08-19 MED ORDER — POTASSIUM CHLORIDE CRYS ER 20 MEQ PO TBCR: 60.0000 meq | EXTENDED_RELEASE_TABLET | Freq: Every day | ORAL | Status: DC

## 2012-08-19 NOTE — Telephone Encounter (Signed)
Sent in Rx for Klor Con 3 tabs daily

## 2012-08-19 NOTE — Telephone Encounter (Signed)
Was told to increase her Klor-Con to 3 per day and she runs out sooner - pharm said for her to call her doctor to get a new script for the correct amount  [facility]- [address]

## 2012-08-19 NOTE — Telephone Encounter (Signed)
Will forward message to Dr. Earnest Bailey. Last RX sent in 05/29.  OV 05/28.

## 2012-09-02 ENCOUNTER — Other Ambulatory Visit: Payer: Self-pay | Admitting: Family Medicine

## 2012-09-08 ENCOUNTER — Other Ambulatory Visit: Payer: Self-pay | Admitting: Family Medicine

## 2012-09-30 ENCOUNTER — Other Ambulatory Visit: Payer: Self-pay | Admitting: Family Medicine

## 2012-10-13 DIAGNOSIS — IMO0002 Reserved for concepts with insufficient information to code with codable children: Secondary | ICD-10-CM | POA: Diagnosis not present

## 2012-10-13 DIAGNOSIS — F410 Panic disorder [episodic paroxysmal anxiety] without agoraphobia: Secondary | ICD-10-CM | POA: Diagnosis not present

## 2012-10-22 ENCOUNTER — Telehealth: Payer: Self-pay | Admitting: Family Medicine

## 2012-10-22 NOTE — Telephone Encounter (Signed)
Patient is calling about a Dx of Shearing of the Sheet, it is a type of crusty rash.  Kathleen Roy prescribed an ointment that her pharmacy said she will need a brand new Rx for Triamcinolone Acetonide Cream - 0.1%.  She uses [facility] on [address].

## 2012-10-23 NOTE — Telephone Encounter (Signed)
Spoke with patient and informed her that MD would want to view the rash before rx could be called in. Patient states that she had some problems going on with her teeth right now and wouldn't be able to get in right away but would try to come in the beginning of next month.

## 2012-11-25 ENCOUNTER — Other Ambulatory Visit: Payer: Self-pay | Admitting: Family Medicine

## 2012-12-01 ENCOUNTER — Ambulatory Visit (INDEPENDENT_AMBULATORY_CARE_PROVIDER_SITE_OTHER): Payer: Medicare Other | Admitting: Family Medicine

## 2012-12-01 ENCOUNTER — Encounter: Payer: Self-pay | Admitting: Family Medicine

## 2012-12-01 VITALS — BP 141/79 | HR 100 | Ht 62.0 in | Wt 338.0 lb

## 2012-12-01 DIAGNOSIS — I10 Essential (primary) hypertension: Secondary | ICD-10-CM | POA: Diagnosis not present

## 2012-12-01 DIAGNOSIS — R609 Edema, unspecified: Secondary | ICD-10-CM | POA: Diagnosis not present

## 2012-12-01 DIAGNOSIS — I872 Venous insufficiency (chronic) (peripheral): Secondary | ICD-10-CM

## 2012-12-01 DIAGNOSIS — E785 Hyperlipidemia, unspecified: Secondary | ICD-10-CM

## 2012-12-01 DIAGNOSIS — I8310 Varicose veins of unspecified lower extremity with inflammation: Secondary | ICD-10-CM

## 2012-12-01 DIAGNOSIS — M545 Low back pain, unspecified: Secondary | ICD-10-CM | POA: Diagnosis not present

## 2012-12-01 DIAGNOSIS — Z79899 Other long term (current) drug therapy: Secondary | ICD-10-CM | POA: Diagnosis not present

## 2012-12-01 DIAGNOSIS — L538 Other specified erythematous conditions: Secondary | ICD-10-CM

## 2012-12-01 LAB — CBC
Hemoglobin: 14.1 g/dL (ref 12.0–15.0)
MCV: 88.9 fL (ref 78.0–100.0)
Platelets: 205 10*3/uL (ref 150–400)
RBC: 4.67 MIL/uL (ref 3.87–5.11)
WBC: 7.7 10*3/uL (ref 4.0–10.5)

## 2012-12-01 LAB — POCT GLYCOSYLATED HEMOGLOBIN (HGB A1C): Hemoglobin A1C: 6

## 2012-12-01 LAB — COMPREHENSIVE METABOLIC PANEL
AST: 10 U/L (ref 0–37)
Albumin: 4 g/dL (ref 3.5–5.2)
Alkaline Phosphatase: 79 U/L (ref 39–117)
BUN: 15 mg/dL (ref 6–23)
Creat: 0.79 mg/dL (ref 0.50–1.10)
Glucose, Bld: 95 mg/dL (ref 70–99)
Potassium: 3.5 mEq/L (ref 3.5–5.3)
Total Bilirubin: 0.4 mg/dL (ref 0.3–1.2)

## 2012-12-01 MED ORDER — FLUOCINONIDE-E 0.05 % EX CREA: TOPICAL_CREAM | Freq: Two times a day (BID) | CUTANEOUS | Status: DC

## 2012-12-01 MED ORDER — CYCLOBENZAPRINE HCL 10 MG PO TABS: 10.0000 mg | ORAL_TABLET | Freq: Two times a day (BID) | ORAL | Status: DC | PRN

## 2012-12-01 NOTE — Patient Instructions (Addendum)
Follow-up in 6 months  I will call you with abnormal labs  Your psychiatrist can request your labwork  Use kenalog cream for thick areas on lower legs

## 2012-12-01 NOTE — Progress Notes (Signed)
  Subjective:    Patient ID: Kathleen Roy, female    DOB: [birth year], 51 y.o.   MRN: [id number]  HPIHere for follow-up of chronic medical conditions  HYPERTENSION  BP Readings from Last 3 Encounters:  12/01/12 141/79  05/12/12 144/82  10/15/11 130/84    Hypertension ROS: taking medications as instructed, no medication side effects noted, no chest pain on exertion and no dyspnea on exertion.   HYPERLIPIDEMIA  Diet: Reports eating very little at baseline. Has had 10 pound weight loss since last visit 7 months ago Exercise: No regular exercise Wt Readings from Last 3 Encounters:  12/01/12 338 lb (153.316 kg)  05/12/12 347 lb 11.2 oz (157.716 kg)  10/15/11 350 lb 8 oz (158.986 kg)   ROS:  Denies RUQ pain, myalgias, or symptoms or coronary ischemia Lab Results  Component Value Date   LDLCALC 40 05/12/2012   Lab Results  Component Value Date   CHOL 148 05/12/2012   CHOL 176 11/16/2007   Lab Results  Component Value Date   HDL 46 05/12/2012   HDL 51 40/08/8118   Lab Results  Component Value Date   TRIG 308* 05/12/2012   TRIG 307* 11/16/2007   Lab Results  Component Value Date   ALT 8 12/01/2012   AST 10 12/01/2012   ALKPHOS 79 12/01/2012   BILITOT 0.4 12/01/2012    Sore on back x 1 month, has been picking scab.  No drainage or bleeding  Continues to do ADL's carefully with cane.  Uses potty chair and bath chair.  No falls.    Review of Systems See HPI    Objective:   Physical Exam GEN: Alert & Oriented, No acute distress CV:  Regular Rate & Rhythm, no murmur Respiratory:  Normal work of breathing, CTAB Abd:  + BS, soft, no tenderness to palpation Ext: no pre-tibial edema Skin:  0.21mm scab on back no erythema, drainage. Bilateral LE edema with skin chronic venous stasis changes.  Right worse than elft       Assessment & Plan:

## 2012-12-02 DIAGNOSIS — I872 Venous insufficiency (chronic) (peripheral): Secondary | ICD-10-CM | POA: Insufficient documentation

## 2012-12-02 NOTE — Assessment & Plan Note (Signed)
Tolerating meds without SE.  Some weight loss, no loss of functional strength. Encouraged her on positive change due to changes in diet.

## 2012-12-02 NOTE — Assessment & Plan Note (Signed)
Overall dysfunctional eating patterns.  Reluctant to change at this time.

## 2012-12-02 NOTE — Assessment & Plan Note (Signed)
Continue tramadol.  Patient would like to try flexeril again (had previously not worked well).  Had been using robaxin with little effect.  I agreed to change muscle relaxants, discussed common side effects

## 2012-12-02 NOTE — Assessment & Plan Note (Signed)
Wil check BMEt today.  Again stressed importance of compression,.elevation, weightl oss as primary control.  Will continue diuretic regimen which has been working well for her and has been stable

## 2012-12-02 NOTE — Assessment & Plan Note (Signed)
Skin under breasts and under pannus doing well with no maceration, minimal redness.  Continue nystatin powder.

## 2012-12-02 NOTE — Assessment & Plan Note (Signed)
Discussed treatment of underlying venous stasis, and avoidance of skin picking as key.  Will rx potent topical steroid for use.

## 2012-12-02 NOTE — Assessment & Plan Note (Signed)
Doing well, continue current regimen. 

## 2012-12-23 ENCOUNTER — Other Ambulatory Visit: Payer: Self-pay | Admitting: Family Medicine

## 2012-12-24 ENCOUNTER — Other Ambulatory Visit: Payer: Self-pay | Admitting: Family Medicine

## 2012-12-24 MED ORDER — TORSEMIDE 20 MG PO TABS: 60.0000 mg | ORAL_TABLET | Freq: Every day | ORAL | Status: DC

## 2012-12-24 MED ORDER — PREGABALIN 150 MG PO CAPS: 150.0000 mg | ORAL_CAPSULE | Freq: Two times a day (BID) | ORAL | Status: DC

## 2012-12-28 ENCOUNTER — Telehealth: Payer: Self-pay | Admitting: Family Medicine

## 2012-12-28 NOTE — Telephone Encounter (Signed)
Disability Parking Placard form to be completed by [facility].

## 2012-12-29 NOTE — Telephone Encounter (Signed)
Form filled out and placed in fax pile. 

## 2013-01-21 DIAGNOSIS — H40009 Preglaucoma, unspecified, unspecified eye: Secondary | ICD-10-CM | POA: Diagnosis not present

## 2013-01-21 DIAGNOSIS — H409 Unspecified glaucoma: Secondary | ICD-10-CM | POA: Diagnosis not present

## 2013-02-08 DIAGNOSIS — IMO0002 Reserved for concepts with insufficient information to code with codable children: Secondary | ICD-10-CM | POA: Diagnosis not present

## 2013-02-08 DIAGNOSIS — F410 Panic disorder [episodic paroxysmal anxiety] without agoraphobia: Secondary | ICD-10-CM | POA: Diagnosis not present

## 2013-02-17 ENCOUNTER — Other Ambulatory Visit: Payer: Self-pay | Admitting: Family Medicine

## 2013-03-24 ENCOUNTER — Other Ambulatory Visit: Payer: Self-pay | Admitting: Family Medicine

## 2013-03-31 ENCOUNTER — Telehealth: Payer: Self-pay | Admitting: Family Medicine

## 2013-03-31 NOTE — Telephone Encounter (Signed)
Patient is calling about the number of Potasium she is supposed to be taking.  The bottle says for her to take 2 a days, but Dr. Earnest Bailey wants her to take 3 a day so she wants to make sure which is correct.  If it is 3 a day then the pharmacy needs to be corrected and that will mean that she will run out too soon.

## 2013-04-11 ENCOUNTER — Inpatient Hospital Stay (HOSPITAL_COMMUNITY): Payer: Medicare Other

## 2013-04-11 ENCOUNTER — Encounter (HOSPITAL_COMMUNITY): Payer: Self-pay

## 2013-04-11 ENCOUNTER — Emergency Department (HOSPITAL_COMMUNITY): Payer: Medicare Other

## 2013-04-11 ENCOUNTER — Inpatient Hospital Stay (HOSPITAL_COMMUNITY)
Admission: EM | Admit: 2013-04-11 | Discharge: 2013-04-13 | DRG: 191 | Disposition: A | Discharge status: Home Health | Payer: Medicare Other | Attending: Family Medicine | Admitting: Family Medicine

## 2013-04-11 DIAGNOSIS — I4510 Unspecified right bundle-branch block: Secondary | ICD-10-CM | POA: Diagnosis not present

## 2013-04-11 DIAGNOSIS — J988 Other specified respiratory disorders: Secondary | ICD-10-CM | POA: Diagnosis not present

## 2013-04-11 DIAGNOSIS — G43909 Migraine, unspecified, not intractable, without status migrainosus: Secondary | ICD-10-CM

## 2013-04-11 DIAGNOSIS — G609 Hereditary and idiopathic neuropathy, unspecified: Secondary | ICD-10-CM | POA: Diagnosis present

## 2013-04-11 DIAGNOSIS — F339 Major depressive disorder, recurrent, unspecified: Secondary | ICD-10-CM | POA: Diagnosis not present

## 2013-04-11 DIAGNOSIS — K5900 Constipation, unspecified: Secondary | ICD-10-CM | POA: Diagnosis present

## 2013-04-11 DIAGNOSIS — Z6841 Body Mass Index (BMI) 40.0 and over, adult: Secondary | ICD-10-CM

## 2013-04-11 DIAGNOSIS — J449 Chronic obstructive pulmonary disease, unspecified: Secondary | ICD-10-CM | POA: Diagnosis not present

## 2013-04-11 DIAGNOSIS — F410 Panic disorder [episodic paroxysmal anxiety] without agoraphobia: Secondary | ICD-10-CM | POA: Diagnosis present

## 2013-04-11 DIAGNOSIS — R0902 Hypoxemia: Secondary | ICD-10-CM | POA: Diagnosis present

## 2013-04-11 DIAGNOSIS — D72829 Elevated white blood cell count, unspecified: Secondary | ICD-10-CM | POA: Diagnosis present

## 2013-04-11 DIAGNOSIS — J4489 Other specified chronic obstructive pulmonary disease: Principal | ICD-10-CM | POA: Diagnosis present

## 2013-04-11 DIAGNOSIS — R0689 Other abnormalities of breathing: Secondary | ICD-10-CM

## 2013-04-11 DIAGNOSIS — IMO0001 Reserved for inherently not codable concepts without codable children: Secondary | ICD-10-CM | POA: Diagnosis present

## 2013-04-11 DIAGNOSIS — E876 Hypokalemia: Secondary | ICD-10-CM | POA: Diagnosis present

## 2013-04-11 DIAGNOSIS — G629 Polyneuropathy, unspecified: Secondary | ICD-10-CM | POA: Diagnosis present

## 2013-04-11 DIAGNOSIS — E662 Morbid (severe) obesity with alveolar hypoventilation: Secondary | ICD-10-CM | POA: Diagnosis present

## 2013-04-11 DIAGNOSIS — G8929 Other chronic pain: Secondary | ICD-10-CM | POA: Diagnosis present

## 2013-04-11 DIAGNOSIS — I10 Essential (primary) hypertension: Secondary | ICD-10-CM | POA: Diagnosis present

## 2013-04-11 DIAGNOSIS — Z87891 Personal history of nicotine dependence: Secondary | ICD-10-CM

## 2013-04-11 DIAGNOSIS — J398 Other specified diseases of upper respiratory tract: Secondary | ICD-10-CM | POA: Diagnosis not present

## 2013-04-11 DIAGNOSIS — L538 Other specified erythematous conditions: Secondary | ICD-10-CM

## 2013-04-11 DIAGNOSIS — R7309 Other abnormal glucose: Secondary | ICD-10-CM | POA: Diagnosis present

## 2013-04-11 DIAGNOSIS — G40909 Epilepsy, unspecified, not intractable, without status epilepticus: Secondary | ICD-10-CM | POA: Diagnosis not present

## 2013-04-11 DIAGNOSIS — E873 Alkalosis: Secondary | ICD-10-CM | POA: Diagnosis not present

## 2013-04-11 DIAGNOSIS — E785 Hyperlipidemia, unspecified: Secondary | ICD-10-CM | POA: Diagnosis present

## 2013-04-11 DIAGNOSIS — J9610 Chronic respiratory failure, unspecified whether with hypoxia or hypercapnia: Secondary | ICD-10-CM | POA: Diagnosis present

## 2013-04-11 DIAGNOSIS — Z872 Personal history of diseases of the skin and subcutaneous tissue: Secondary | ICD-10-CM

## 2013-04-11 DIAGNOSIS — R609 Edema, unspecified: Secondary | ICD-10-CM | POA: Diagnosis present

## 2013-04-11 DIAGNOSIS — R0989 Other specified symptoms and signs involving the circulatory and respiratory systems: Secondary | ICD-10-CM

## 2013-04-11 DIAGNOSIS — M545 Low back pain: Secondary | ICD-10-CM

## 2013-04-11 DIAGNOSIS — A4159 Other Gram-negative sepsis: Secondary | ICD-10-CM | POA: Diagnosis not present

## 2013-04-11 DIAGNOSIS — K5909 Other constipation: Secondary | ICD-10-CM

## 2013-04-11 DIAGNOSIS — R0609 Other forms of dyspnea: Secondary | ICD-10-CM | POA: Diagnosis not present

## 2013-04-11 HISTORY — DX: Sepsis, unspecified organism: A41.9

## 2013-04-11 HISTORY — DX: Major depressive disorder, single episode, unspecified: F32.9

## 2013-04-11 HISTORY — DX: Fibromyalgia: M79.7

## 2013-04-11 HISTORY — DX: Depression, unspecified: F32.A

## 2013-04-11 HISTORY — DX: Hyperlipidemia, unspecified: E78.5

## 2013-04-11 LAB — COMPREHENSIVE METABOLIC PANEL
ALT: 11 U/L (ref 0–35)
AST: 13 U/L (ref 0–37)
Albumin: 3.5 g/dL (ref 3.5–5.2)
Alkaline Phosphatase: 84 U/L (ref 39–117)
Glucose, Bld: 168 mg/dL — ABNORMAL HIGH (ref 70–99)
Potassium: 3.1 mEq/L — ABNORMAL LOW (ref 3.5–5.1)
Sodium: 141 mEq/L (ref 135–145)
Total Protein: 7.7 g/dL (ref 6.0–8.3)

## 2013-04-11 LAB — CBC WITH DIFFERENTIAL/PLATELET
Basophils Relative: 0 % (ref 0–1)
Eosinophils Absolute: 0 10*3/uL (ref 0.0–0.7)
Lymphs Abs: 0.5 10*3/uL — ABNORMAL LOW (ref 0.7–4.0)
MCH: 29.6 pg (ref 26.0–34.0)
Neutrophils Relative %: 95 % — ABNORMAL HIGH (ref 43–77)
Platelets: 191 10*3/uL (ref 150–400)
RBC: 4.79 MIL/uL (ref 3.87–5.11)

## 2013-04-11 LAB — URINALYSIS, ROUTINE W REFLEX MICROSCOPIC
Bilirubin Urine: NEGATIVE
Glucose, UA: NEGATIVE mg/dL
Hgb urine dipstick: NEGATIVE
Ketones, ur: NEGATIVE mg/dL
pH: 7.5 (ref 5.0–8.0)

## 2013-04-11 LAB — CBC
Hemoglobin: 13.4 g/dL (ref 12.0–15.0)
RBC: 4.46 MIL/uL (ref 3.87–5.11)
WBC: 11.5 10*3/uL — ABNORMAL HIGH (ref 4.0–10.5)

## 2013-04-11 LAB — HEMOGLOBIN A1C
Hgb A1c MFr Bld: 5.8 % — ABNORMAL HIGH (ref <5.7)
Mean Plasma Glucose: 120 mg/dL — ABNORMAL HIGH (ref <117)

## 2013-04-11 LAB — CREATININE, SERUM
Creatinine, Ser: 0.73 mg/dL (ref 0.50–1.10)
GFR calc Af Amer: >90 mL/min (ref >90)
GFR calc non Af Amer: >90 mL/min (ref >90)

## 2013-04-11 MED ORDER — ENOXAPARIN SODIUM 40 MG/0.4ML ~~LOC~~ SOLN
40.0000 mg | SUBCUTANEOUS | Status: DC
  Administered 2013-04-11: 40 mg via SUBCUTANEOUS
  Filled 2013-04-11 (×3): qty 0.4

## 2013-04-11 MED ORDER — TRAMADOL HCL 50 MG PO TABS: 150.0000 mg | ORAL_TABLET | Freq: Two times a day (BID) | ORAL | Status: DC

## 2013-04-11 MED ORDER — PREGABALIN 50 MG PO CAPS: 150.0000 mg | ORAL_CAPSULE | Freq: Two times a day (BID) | ORAL | Status: DC

## 2013-04-11 MED ORDER — POTASSIUM CHLORIDE CRYS ER 20 MEQ PO TBCR
40.0000 meq | EXTENDED_RELEASE_TABLET | Freq: Two times a day (BID) | ORAL | Status: DC
  Administered 2013-04-11 (×2): 40 meq via ORAL
  Filled 2013-04-11 (×4): qty 2

## 2013-04-11 MED ORDER — VENLAFAXINE HCL ER 150 MG PO CP24
300.0000 mg | ORAL_CAPSULE | Freq: Every day | ORAL | Status: DC
  Administered 2013-04-11 – 2013-04-13 (×3): 300 mg via ORAL
  Filled 2013-04-11 (×3): qty 2

## 2013-04-11 MED ORDER — TORSEMIDE 20 MG PO TABS
60.0000 mg | ORAL_TABLET | Freq: Every day | ORAL | Status: DC
  Filled 2013-04-11: qty 3

## 2013-04-11 MED ORDER — IOHEXOL 350 MG/ML SOLN
100.0000 mL | Freq: Once | INTRAVENOUS | Status: AC | PRN
  Administered 2013-04-11: 100 mL via INTRAVENOUS

## 2013-04-11 MED ORDER — POLYETHYLENE GLYCOL 3350 17 G PO PACK
34.0000 g | PACK | Freq: Two times a day (BID) | ORAL | Status: DC
  Administered 2013-04-12 (×2): 17 g via ORAL
  Administered 2013-04-12: 34 g via ORAL
  Filled 2013-04-11 (×4): qty 2
  Filled 2013-04-11: qty 1
  Filled 2013-04-11: qty 2
  Filled 2013-04-11: qty 1

## 2013-04-11 MED ORDER — SIMVASTATIN 20 MG PO TABS
20.0000 mg | ORAL_TABLET | Freq: Every day | ORAL | Status: DC
  Administered 2013-04-11 – 2013-04-12 (×2): 20 mg via ORAL
  Filled 2013-04-11 (×3): qty 1

## 2013-04-11 MED ORDER — SODIUM CHLORIDE 0.9 % IV SOLN
INTRAVENOUS | Status: DC
  Administered 2013-04-12: 15:00:00 via INTRAVENOUS

## 2013-04-11 MED ORDER — ACETAMINOPHEN 650 MG RE SUPP: 650.0000 mg | Freq: Four times a day (QID) | RECTAL | Status: DC | PRN

## 2013-04-11 MED ORDER — POTASSIUM CHLORIDE CRYS ER 20 MEQ PO TBCR: 40.0000 meq | EXTENDED_RELEASE_TABLET | Freq: Every day | ORAL | Status: DC

## 2013-04-11 MED ORDER — ACETAMINOPHEN 325 MG PO TABS
650.0000 mg | ORAL_TABLET | Freq: Four times a day (QID) | ORAL | Status: DC | PRN
  Administered 2013-04-11: 650 mg via ORAL
  Filled 2013-04-11: qty 2

## 2013-04-11 MED ORDER — ARIPIPRAZOLE 5 MG PO TABS
5.0000 mg | ORAL_TABLET | Freq: Every day | ORAL | Status: DC
  Filled 2013-04-11: qty 1

## 2013-04-11 MED ORDER — PREGABALIN 50 MG PO CAPS
75.0000 mg | ORAL_CAPSULE | Freq: Two times a day (BID) | ORAL | Status: DC
  Administered 2013-04-11 – 2013-04-13 (×4): 75 mg via ORAL
  Filled 2013-04-11 (×3): qty 1
  Filled 2013-04-11: qty 3

## 2013-04-11 MED ORDER — ARIPIPRAZOLE 2 MG PO TABS
2.0000 mg | ORAL_TABLET | Freq: Every day | ORAL | Status: DC
  Administered 2013-04-11 – 2013-04-13 (×3): 2 mg via ORAL
  Filled 2013-04-11 (×3): qty 1

## 2013-04-11 MED ORDER — TRAMADOL HCL 50 MG PO TABS
50.0000 mg | ORAL_TABLET | Freq: Two times a day (BID) | ORAL | Status: DC
  Administered 2013-04-11 – 2013-04-13 (×4): 50 mg via ORAL
  Filled 2013-04-11 (×4): qty 1

## 2013-04-11 NOTE — Progress Notes (Signed)
Pt has not had BM since 4/24/ STates she has problems with constipation at home. Needs laxatives and stool softeners now.

## 2013-04-11 NOTE — ED Notes (Signed)
Care transferred, report given Misty Stanley, RN.

## 2013-04-11 NOTE — H&P (Signed)
Kathleen Roy is an 52 y.o. female.   Assessment/Plan 52 yo morbidly obese female with history of COPD here with increased fatigue and drowsiness found to be hypoxic.  1. Hypoxia:  Likely 2/2 to COPD along with obesity hypoventilation syndrome resulting in CO2 retention. She reports that she typically has difficulty falling asleep and uses trazodone and hydroxyzine.  She is still somewhat drowsy this morning, will admit to monitor.  Hold trazodone and hydroxyzine for now.  Check ABG.  If ABG is indicative of chronic respiratory failure can try Bipap or CPAP at night. No need for urgent BIpap at this time as she is mentating well.   Has been recommended to have home O2 and sleep study in the past but it does not appear this has been obtained. No wheezing on exam,  CXR without any consolidation or signs of CHF.  Will check BNP given LE edema.  She likely will need home O2, will consult case management to help with arranging this as well.   2. Hypertension:  BP appears well controlled at this time.  Only using torsemide and metolazone for chronic LE swelling. Only using metolazone qweek.  Will continue only torsemide for now.   3. Elevated glucose:  Glucose 168 on labwork from today.  Reports dry mouth.  Polyuria but takes daily torsemide.  No known diabetes, check A1c.  4.  Leukocytosis/Elevated temp:  Elevated WBC to 15.5.  Also with rectal temp recorded at 100.3.  She denies any fever at home.  No clear source of infection.  Skin does not appear cellulitic around legs or below pannus.  Urine and CXR are normal.  Could consider DVT with possible PE given elevated temp with chronic LE edema.  5. Chronic pain:  Continue home tramadol, lyrica and tylenol prn.   6. Depression:  Continue home effxor and abilify.  7.  FEN/GI: Regular diet for now. Hypokalemia noted, will replete  8.  PPX: Lovenox  9. Dispo:  Pending further workup  Chief Complaint: Drowsiness and fatigue  HPI: 52 yo morbidly obese  female with PMH of COPD and HTN here with complaint of increasing drowsiness and fatigue over the past 24 hours.  Typically needs medication to help her sleep but last night she reports that she could not stay awake at all.  Reports that she has a headache this morning and doesn't fell well.  Has had some improvement with O2 use in the ED but still feel pretty drowsy.  She denies any fevers at home, shortness of breath, chest pain, nausea or vomiting.  Has had cellulitis in the past but has not noticed anything recently.   Past Medical History  Diagnosis Date  . Normal echocardiogram 05/30/05    suboptimal study  . Obesity, morbid (more than 100 lbs over ideal weight or BMI > 40)     obese since childhood  . Fibromyalgia   . Chronic pain   . Hyperlipidemia   . Depression   . Sepsis     Past Surgical History  Procedure Laterality Date  . Total abdominal hysterectomy w/ bilateral salpingoophorectomy  1994  . Cholecystectomy    . Appendectomy      No family history on file. Social History:  reports that she quit smoking about 4 years ago. Her smoking use included Cigarettes. She has a 30 pack-year smoking history. She has never used smokeless tobacco. She reports that she does not drink alcohol or use illicit drugs.  Allergies:  Allergies  Allergen Reactions  . Aspirin     REACTION: hives     (Not in a hospital admission)  Results for orders placed during the hospital encounter of 04/11/13 (from the past 48 hour(s))  CBC WITH DIFFERENTIAL     Status: Abnormal   Collection Time    04/11/13  7:07 AM      Result Value Range   WBC 15.5 (*) 4.0 - 10.5 K/uL   RBC 4.79  3.87 - 5.11 MIL/uL   Hemoglobin 14.2  12.0 - 15.0 g/dL   HCT 19.1  47.8 - 29.5 %   MCV 89.8  78.0 - 100.0 fL   MCH 29.6  26.0 - 34.0 pg   MCHC 33.0  30.0 - 36.0 g/dL   RDW 62.1  30.8 - 65.7 %   Platelets 191  150 - 400 K/uL   Neutrophils Relative 95 (*) 43 - 77 %   Neutro Abs 14.6 (*) 1.7 - 7.7 K/uL    Lymphocytes Relative 3 (*) 12 - 46 %   Lymphs Abs 0.5 (*) 0.7 - 4.0 K/uL   Monocytes Relative 3  3 - 12 %   Monocytes Absolute 0.4  0.1 - 1.0 K/uL   Eosinophils Relative 0  0 - 5 %   Eosinophils Absolute 0.0  0.0 - 0.7 K/uL   Basophils Relative 0  0 - 1 %   Basophils Absolute 0.0  0.0 - 0.1 K/uL  COMPREHENSIVE METABOLIC PANEL     Status: Abnormal   Collection Time    04/11/13  7:07 AM      Result Value Range   Sodium 141  135 - 145 mEq/L   Potassium 3.1 (*) 3.5 - 5.1 mEq/L   Chloride 91 (*) 96 - 112 mEq/L   CO2 39 (*) 19 - 32 mEq/L   Glucose, Bld 168 (*) 70 - 99 mg/dL   BUN 13  6 - 23 mg/dL   Creatinine, Ser 8.46  0.50 - 1.10 mg/dL   Calcium 96.2  8.4 - 95.2 mg/dL   Total Protein 7.7  6.0 - 8.3 g/dL   Albumin 3.5  3.5 - 5.2 g/dL   AST 13  0 - 37 U/L   ALT 11  0 - 35 U/L   Alkaline Phosphatase 84  39 - 117 U/L   Total Bilirubin 0.5  0.3 - 1.2 mg/dL   GFR calc non Af Amer >90  >90 mL/min   GFR calc Af Amer >90  >90 mL/min   Comment:            The eGFR has been calculated     using the CKD EPI equation.     This calculation has not been     validated in all clinical     situations.     eGFR's persistently     <90 mL/min signify     possible Chronic Kidney Disease.  POCT I-STAT TROPONIN I     Status: None   Collection Time    04/11/13  7:25 AM      Result Value Range   Troponin i, poc 0.00  0.00 - 0.08 ng/mL   Comment 3            Comment: Due to the release kinetics of cTnI,     a negative result within the first hours     of the onset of symptoms does not rule out     myocardial infarction with certainty.     If  myocardial infarction is still suspected,     repeat the test at appropriate intervals.  URINALYSIS, ROUTINE W REFLEX MICROSCOPIC     Status: None   Collection Time    04/11/13  7:49 AM      Result Value Range   Color, Urine YELLOW  YELLOW   APPearance CLEAR  CLEAR   Specific Gravity, Urine 1.018  1.005 - 1.030   pH 7.5  5.0 - 8.0   Glucose, UA NEGATIVE   NEGATIVE mg/dL   Hgb urine dipstick NEGATIVE  NEGATIVE   Bilirubin Urine NEGATIVE  NEGATIVE   Ketones, ur NEGATIVE  NEGATIVE mg/dL   Protein, ur NEGATIVE  NEGATIVE mg/dL   Urobilinogen, UA 0.2  0.0 - 1.0 mg/dL   Nitrite NEGATIVE  NEGATIVE   Leukocytes, UA NEGATIVE  NEGATIVE   Comment: MICROSCOPIC NOT DONE ON URINES WITH NEGATIVE PROTEIN, BLOOD, LEUKOCYTES, NITRITE, OR GLUCOSE <1000 mg/dL.   Dg Chest 2 View  04/11/2013  *RADIOLOGY REPORT*  Clinical Data: Weakness and fatigue.  CHEST - 2 VIEW  Comparison: 01/10/2010 chest radiograph  Findings: Upper limits normal heart size again noted. Mild peribronchial thickening is unchanged. This is a mildly low volume film. There is no evidence of focal airspace disease, pulmonary edema, suspicious pulmonary nodule/mass, pleural effusion, or pneumothorax. No acute bony abnormalities are identified.  IMPRESSION: No evidence of acute cardiopulmonary disease.   Original Report Authenticated By: Harmon Pier, M.D.     Review of Systems  Constitutional: Negative for fever and chills.  HENT: Negative for congestion and sore throat.   Eyes: Negative for blurred vision.  Respiratory: Negative for cough and shortness of breath.   Cardiovascular: Positive for leg swelling (chronic). Negative for chest pain, orthopnea and PND.  Genitourinary: Negative for dysuria and urgency.  Musculoskeletal: Positive for back pain.  Neurological: Positive for headaches.    Blood pressure 118/54, pulse 95, temperature 100.3 F (37.9 C), temperature source Rectal, resp. rate 22, SpO2 92.00%. Physical Exam  Constitutional: She is oriented to person, place, and time.  Morbidly obese, nad.  Drowsy appearing but answers questions appropriately and is alert and oriented.   HENT:  Head: Normocephalic and atraumatic.  Eyes: Conjunctivae and EOM are normal. Pupils are equal, round, and reactive to light.  Neck: Neck supple.  Cardiovascular: Normal rate and normal heart sounds.    Respiratory: Effort normal and breath sounds normal. No respiratory distress. She has no wheezes.  GI: Soft. Bowel sounds are normal.  Obese, NT.    Musculoskeletal: She exhibits edema (2+ edema bilaterally). She exhibits no tenderness.  Neurological: She is alert and oriented to person, place, and time.  Skin: Skin is warm.  No erythema or warmth on legs or under pannus.  Skin is intact.       Ephram Kornegay 04/11/2013, 9:49 AM

## 2013-04-11 NOTE — Progress Notes (Signed)
Utilization review completed.  

## 2013-04-11 NOTE — ED Notes (Addendum)
Care transferred, report received Karen Cobb, RN. 

## 2013-04-11 NOTE — ED Notes (Signed)
Per EMS, pt c/o weakness.  Pt reports becoming increasingly weak over the past couple of days.  Pt states she is independent but has not been able to perform her normal activities at home.  Pt also noted to have edema to all extremities and delayed responses.

## 2013-04-11 NOTE — ED Notes (Signed)
Pt in state of poor hygiene-- states "I have not been in the shower for weeks-- because I don't feel well." pt is malodorous- husband at bedside.

## 2013-04-11 NOTE — H&P (Signed)
I have seen and examined this patient. I have discussed with Dr Ashley Royalty.  I agree with their findings and plans as documented in their admission note.  ACUTE ISSUES  1. Somnolence - Acute - Patient interactive and conversant and appropriate, but falls asleep easily.  - Hypoxemia   - Metabolic Alkalosis with compensatory hypercapnia possibly from loop diuretic-related contraction alkalosis that could lead to hypoventilation in addition to her possible Obesity Hypoventilation Syndrome - On multiple sedating medications   Plan: Check UDS and serum ammonia. Hold trazodone and hydroxyzine Half doses of her neuroleptic, AED, and tramadol.  Given Normal Saline 150 mg/hour to see if metabolic alkalosis with improve and respiratory drive will increase.  Hypoxia  - Possible Obesity Hypoventilation Syndrome, rule out heart failure, pulmonary embolism, pneumonia,   - Given hypercapnia with nearly normal pH, suspect pt has chronic hypoventilation with elevated arterial pCO2.  - Plan: CT-angiogram chest to look for PULMONARY EMBOLI  BNP to look for evidence of CHF UDS to look for illicit sedating medications. Consider trial of CPAP at night for contribution of OSA to hypercapnia and daytime somnolence.  If helpful, would get patient temporary CPAP device for home for chronic respiratory failure pending formal polysomnograpy testing for OSA.

## 2013-04-11 NOTE — ED Provider Notes (Signed)
History     CSN: [id number]  Arrival date & time 04/11/13  4098   First MD Initiated Contact with Patient 04/11/13 [phone]      Chief Complaint  Patient presents with  . Weakness    (Consider location/radiation/quality/duration/timing/severity/associated sxs/prior treatment) Patient is a 52 y.o. female presenting with weakness. The history is provided by the patient.  Weakness Pertinent negatives include no chest pain, no abdominal pain, no headaches and no shortness of breath.   patient states that she slept a lot yesterday. She states that she did not even eat dinner at midnight. She states she has been sleeping since. She states is unusual for her she does not keep normal hours. No chest pain. No trouble breathing. No fevers. No abdominal pain. No diarrhea. No dysuria. Patient had initial pulse ox of 83%. She is not on oxygen at home. No confusion. Patient states her swelling is at its normal level.  Past Medical History  Diagnosis Date  . Normal echocardiogram 05/30/05    suboptimal study  . Obesity, morbid (more than 100 lbs over ideal weight or BMI > 40)     obese since childhood  . Fibromyalgia   . Chronic pain   . Hyperlipidemia   . Sepsis   . Depression   . Sepsis 2002    Past Surgical History  Procedure Laterality Date  . Total abdominal hysterectomy w/ bilateral salpingoophorectomy  1994  . Cholecystectomy    . Appendectomy    . Abd. hernia surgery 2002      History reviewed. No pertinent family history.  History  Substance Use Topics  . Smoking status: Former Smoker -- 1.00 packs/day for 30 years    Types: Cigarettes    Quit date: 01/05/2009  . Smokeless tobacco: Never Used     Comment: Already quit  . Alcohol Use: No    OB History   Grav Para Term Preterm Abortions TAB SAB Ect Mult Living   0 0 0 0 0 0 0 0 0 0       Review of Systems  Constitutional: Positive for fatigue. Negative for activity change and appetite change.  HENT: Negative for neck  stiffness.   Eyes: Negative for pain.  Respiratory: Negative for chest tightness and shortness of breath.   Cardiovascular: Negative for chest pain and leg swelling.  Gastrointestinal: Negative for nausea, vomiting, abdominal pain and diarrhea.  Genitourinary: Negative for flank pain.  Musculoskeletal: Negative for back pain.  Skin: Negative for rash.  Neurological: Positive for weakness. Negative for numbness and headaches.  Psychiatric/Behavioral: Negative for behavioral problems.    Allergies  Aspirin  Home Medications   No current outpatient prescriptions on file.  BP 101/58  Pulse 89  Temp(Src) 98 F (36.7 C) (Rectal)  Resp 20  Ht 5\' 2"  (1.575 m)  Wt 340 lb (154.223 kg)  BMI 62.17 kg/m2  SpO2 96%  Physical Exam  Nursing note and vitals reviewed. Constitutional: She is oriented to person, place, and time. She appears well-developed and well-nourished.  Patient is morbidly obese  HENT:  Head: Normocephalic and atraumatic.  Eyes: EOM are normal. Pupils are equal, round, and reactive to light.  Neck: Normal range of motion. Neck supple.  Cardiovascular: Normal rate, regular rhythm and normal heart sounds.   No murmur heard. Pulmonary/Chest: Effort normal and breath sounds normal. No respiratory distress. She has no wheezes. She has no rales.  Difficult to auscultate lung sounds due to body habitus  Abdominal: Soft. Bowel sounds are  normal. She exhibits no distension. There is no tenderness. There is no rebound and no guarding.  Musculoskeletal: Normal range of motion. She exhibits edema.  Diffuse edema. Venous insufficiency to bilateral lower legs.  Neurological: She is alert and oriented to person, place, and time. No cranial nerve deficit.  Skin: Skin is warm and dry. No rash noted.  Psychiatric: She has a normal mood and affect. Her speech is normal.    ED Course  Procedures (including critical care time)  Labs Reviewed  CBC WITH DIFFERENTIAL - Abnormal;  Notable for the following:    WBC 15.5 (*)    Neutrophils Relative 95 (*)    Neutro Abs 14.6 (*)    Lymphocytes Relative 3 (*)    Lymphs Abs 0.5 (*)    All other components within normal limits  COMPREHENSIVE METABOLIC PANEL - Abnormal; Notable for the following:    Potassium 3.1 (*)    Chloride 91 (*)    CO2 39 (*)    Glucose, Bld 168 (*)    All other components within normal limits  CBC - Abnormal; Notable for the following:    WBC 11.5 (*)    All other components within normal limits  URINALYSIS, ROUTINE W REFLEX MICROSCOPIC  CREATININE, SERUM  PRO B NATRIURETIC PEPTIDE  AMMONIA  HEMOGLOBIN A1C  BLOOD GAS, ARTERIAL  DRUGS OF ABUSE SCREEN W/O ALC, ROUTINE URINE  POCT I-STAT TROPONIN I   Dg Chest 2 View  04/11/2013  *RADIOLOGY REPORT*  Clinical Data: Weakness and fatigue.  CHEST - 2 VIEW  Comparison: 01/10/2010 chest radiograph  Findings: Upper limits normal heart size again noted. Mild peribronchial thickening is unchanged. This is a mildly low volume film. There is no evidence of focal airspace disease, pulmonary edema, suspicious pulmonary nodule/mass, pleural effusion, or pneumothorax. No acute bony abnormalities are identified.  IMPRESSION: No evidence of acute cardiopulmonary disease.   Original Report Authenticated By: Harmon Pier, M.D.      1. Hypoxia   2. Hypercapnemia   3. Chronic respiratory insufficiency   4. Metabolic alkalosis     Date: 04/11/2013  Rate: 100  Rhythm: normal sinus rhythm  QRS Axis: normal  Intervals: normal  ST/T Wave abnormalities: nonspecific ST/T changes  Conduction Disutrbances:right bundle branch block and left anterior fascicular block  Narrative Interpretation:   Old EKG Reviewed: unchanged     MDM  Patient is morbidly obese with increased sleeping per patient. She was found to be hypoxic. Patient states she is normally hypoxic down to the 80s, however at rest she will go to 83%. Patient be admitted to internal medicine for further  evaluation treatment. She is likely chronically hypoxic with compensation of her bicarbonate        Juliet Rude. Rubin Payor, MD 04/11/13 1536

## 2013-04-11 NOTE — ED Notes (Signed)
Pt requesting something to drink, states has not been able to stay awake, and having severe headache since midnight.

## 2013-04-12 DIAGNOSIS — J449 Chronic obstructive pulmonary disease, unspecified: Principal | ICD-10-CM

## 2013-04-12 LAB — CBC
HCT: 42.3 % (ref 36.0–46.0)
MCHC: 32.2 g/dL (ref 30.0–36.0)
RDW: 14.6 % (ref 11.5–15.5)

## 2013-04-12 LAB — BASIC METABOLIC PANEL
BUN: 12 mg/dL (ref 6–23)
Chloride: 97 mEq/L (ref 96–112)
Creatinine, Ser: 0.69 mg/dL (ref 0.50–1.10)
GFR calc Af Amer: >90 mL/min (ref >90)

## 2013-04-12 MED ORDER — TRAZODONE HCL 50 MG PO TABS
75.0000 mg | ORAL_TABLET | Freq: Every day | ORAL | Status: DC
  Administered 2013-04-12: 75 mg via ORAL
  Filled 2013-04-12 (×2): qty 1

## 2013-04-12 MED ORDER — ENOXAPARIN SODIUM 80 MG/0.8ML ~~LOC~~ SOLN
80.0000 mg | SUBCUTANEOUS | Status: DC
  Administered 2013-04-12: 80 mg via SUBCUTANEOUS
  Filled 2013-04-12 (×2): qty 0.8

## 2013-04-12 MED ORDER — POTASSIUM CHLORIDE CRYS ER 20 MEQ PO TBCR
40.0000 meq | EXTENDED_RELEASE_TABLET | Freq: Four times a day (QID) | ORAL | Status: AC
  Administered 2013-04-12 – 2013-04-13 (×4): 40 meq via ORAL
  Filled 2013-04-12: qty 1
  Filled 2013-04-12 (×2): qty 2

## 2013-04-12 NOTE — Evaluation (Signed)
Physical Therapy Evaluation Patient Details Name: Kathleen Roy MRN: [id number] DOB: Jan 06, 1961 Today's Date: 04/12/2013 Time: 4098-1191 PT Time Calculation (min): 34 min  PT Assessment / Plan / Recommendation Clinical Impression  Pt is a 52 y.o. female adm to [facility] secondary to hypoxia and SOB. Pt presents with overall deconditioning, decreased activity tolerance, decreased mobility and decreased indpendence with gt/transfers. Pt unable to amb on RA; O2 destat on RA to 88% sitting EOB. Amb on 1L O2 at end of session pt destat again to 88%; recovered quickly with sitting and breathing techniques. Pt refuses ST rehab. Plans to D/C home alone with her brother checking in on her PRN. Recommend HHPT if pt unagreeable to SNF. Will benefit from skilled PT to maximize functional mobility.     PT Assessment  Patient needs continued PT services    Follow Up Recommendations  Home health PT    Does the patient have the potential to tolerate intense rehabilitation      Barriers to Discharge        Equipment Recommendations   (3 in 1 commode )    Recommendations for Other Services OT consult   Frequency Min 3X/week    Precautions / Restrictions Precautions Precautions: Fall Restrictions Weight Bearing Restrictions: No   Pertinent Vitals/Pain Pain in R LE 2/10 at rest; 8-10/10 with activity. Pt suffers from chronic pain and fibromyalgia. Pt premedicated. O2 destat to 88% on room air; required 1 L O2 for activity to stay in 90% range.      Mobility  Bed Mobility Bed Mobility: Supine to Sit;Sitting - Scoot to Edge of Bed Supine to Sit: 5: Supervision;With rails;HOB elevated Sitting - Scoot to Edge of Bed: 1: +2 Total assist;With rail Sitting - Scoot to [place] of Bed: Patient Percentage: 40% Details for Bed Mobility Assistance: pt demo good technique for supine to sit with increased time and handrails to complete task; O2 stats on 1L were 95%; pt detat on RA to 88% placed back on 1L air recovered  to 92%. Required 2+ (A) to scoot to EOB  Transfers Transfers: Stand to Sit;Sit to Stand Sit to Stand: 3: Mod assist;From elevated surface;With upper extremity assist;From bed Stand to Sit: 4: Min assist;To chair/3-in-1;With upper extremity assist;With armrests Details for Transfer Assistance: vc's for hand placement and safety with RW; pt c/o pain with movement primarily in R LE; fwd flexed trunk and weight shifted anteriorly to complete transfer; pt unsteady initally no LOB.  Ambulation/Gait Ambulation/Gait Assistance: 4: Min guard Ambulation Distance (Feet): 18 Feet (required 2 standing rest breaks on 1L of O2; Destat to 88% ) Assistive device: Rolling walker Ambulation/Gait Assistance Details: vc's for gt sequencing and upright posture. pt destat to 88% while amb on 1L of O2 became highly fatigued and required chair to rest; O2 stat recovered quickly to 92% with rest; demo labored breathing after activity Gait Pattern: Decreased stride length;Shuffle;Decreased trunk rotation;Wide base of support;Trunk flexed Gait velocity: decreased Stairs: No Wheelchair Mobility Wheelchair Mobility: No    Exercises Total Joint Exercises Ankle Circles/Pumps: AROM;Both;10 reps;Supine   PT Diagnosis: Difficulty walking;Acute pain  PT Problem List: Decreased activity tolerance;Decreased balance;Decreased mobility;Decreased knowledge of use of DME;Cardiopulmonary status limiting activity;Pain;Impaired sensation PT Treatment Interventions: Gait training;Stair training;Functional mobility training;Therapeutic activities;Therapeutic exercise;Balance training;Neuromuscular re-education;Patient/family education   PT Goals Acute Rehab PT Goals PT Goal Formulation: With patient Time For Goal Achievement: 04/19/13 Potential to Achieve Goals: Fair Pt will go Supine/Side to Sit: with modified independence PT Goal: Supine/Side to Sit -  Progress: Goal set today Pt will Sit at [facility] of Bed: with modified  independence PT Goal: Sit at [facility] Of Bed - Progress: Goal set today Pt will go Sit to Supine/Side: with modified independence PT Goal: Sit to Supine/Side - Progress: Goal set today Pt will go Sit to Stand: with supervision PT Goal: Sit to Stand - Progress: Goal set today Pt will go Stand to Sit: with modified independence PT Goal: Stand to Sit - Progress: Goal set today Pt will Transfer Bed to Chair/Chair to Bed: with modified independence PT Transfer Goal: Bed to Chair/Chair to Bed - Progress: Goal set today Pt will Ambulate: 16 - 50 feet;with modified independence;with rolling walker PT Goal: Ambulate - Progress: Goal set today Pt will Go Up / Down Stairs: 3-5 stairs;with rail(s);with min assist PT Goal: Up/Down Stairs - Progress: Goal set today  Visit Information  Last PT Received On: 04/12/13 Assistance Needed: +2    Subjective Data  Subjective: "I really dont want to go home with oxygen" agreeable to therapy.  Patient Stated Goal: to go home    Prior Functioning  Home Living Lives With: Alone Available Help at Discharge: Available PRN/intermittently;Other (Comment) (Brother comes over a couple times a week ) Type of Home: House Home Access: Stairs to enter Secretary/administrator of Steps: 3 Entrance Stairs-Rails: Right Home Layout: One level Bathroom Shower/Tub: Tub/shower unit;Curtain Bathroom Accessibility: No Home Adaptive Equipment: Bedside commode/3-in-1;Shower chair without back;Straight cane;Hand-held shower hose Additional Comments: only leaves the house for doctors appointments Prior Function Level of Independence: Independent with assistive device(s) (uses SPC) Able to Take Stairs?: Yes (brother helps her ) Driving: No Vocation: On disability Communication Communication: No difficulties Dominant Hand: Right    Cognition  Cognition Arousal/Alertness: Awake/alert Behavior During Therapy: WFL for tasks assessed/performed Overall Cognitive Status: Within  Functional Limits for tasks assessed    Extremity/Trunk Assessment Right Lower Extremity Assessment RLE ROM/Strength/Tone: WFL for tasks assessed RLE Sensation: History of peripheral neuropathy Left Lower Extremity Assessment LLE ROM/Strength/Tone: WFL for tasks assessed LLE Sensation: History of peripheral neuropathy Trunk Assessment Trunk Assessment: Kyphotic   Balance Balance Balance Assessed: Yes Static Sitting Balance Static Sitting - Balance Support: Bilateral upper extremity supported;Feet supported Static Sitting - Level of Assistance: 5: Stand by assistance Static Sitting - Comment/# of Minutes: o2 destat sitting EOB to 88%; pt tolerated sitting EOB ~45min   End of Session PT - End of Session Equipment Utilized During Treatment: Gait belt;Oxygen Activity Tolerance: Patient limited by fatigue;Other (comment) (SOB ) Patient left: in chair;with call bell/phone within reach Nurse Communication: Mobility status;Other (comment) (O2 stats )  GP     Donell Sievert, [place] [phone] 04/12/2013, 3:45 PM

## 2013-04-12 NOTE — Progress Notes (Signed)
FMTS Attending Note Patient seen and examined by me, discussed with Dr Claiborne Billings and the rest of the team, and I agree with the assessment and plan in this note.  She is reluctant to undergo sleep study as outpatient, but she does agree with this as part of her assessment for highly-suspected OSA.  She does remark that she wakes up with jerking movements from sleep.  Chronic sleepiness.  Plan for trial of CPAP at night in setting of hypercarbia on ABG and compensatory metabolic alkalosis. Very slow reinitiation of some of her medications that may cause sedation (she is particularly distressed at missing her trazodone). Paula Compton, MD

## 2013-04-12 NOTE — Progress Notes (Signed)
Received verbal result from RT of ABG that was done on 04/11/13. Results were unable to be charted yesterday due to cancelled order in computert. Results are: PH: 7.45, pO2: 60.6, pCO2: 59, HCO3: 40.8

## 2013-04-12 NOTE — Progress Notes (Signed)
Patient ID: Kathleen Roy, female   DOB: Apr 08, 1961, 52 y.o.   MRN: [id number] Family Medicine Teaching Service Daily Progress Note Service Page: [phone]   Subjective:  Patient has no complaints this morning except the nasal cannula is bothering her. She has questions on if she is going to be started back on the diuretic.   Objective: Temp:  [97.3 F (36.3 C)-97.7 F (36.5 C)] 97.3 F (36.3 C) (04/28 0603) Pulse Rate:  [73-85] 73 (04/28 0603) Cardiac Rhythm:  [-]  Resp:  [16-20] 16 (04/28 0603) BP: (102)/(53-72) 102/72 mmHg (04/28 0603) SpO2:  [89 %-98 %] 98 % (04/28 0603)  Intake/Output Summary (Last 24 hours) at 04/12/13 1458 Last data filed at 04/12/13 0500  Gross per 24 hour  Intake    400 ml  Output      0 ml  Net    400 ml    Exam: Constitutional: She is oriented to person, place, and time. Morbidly obese.  HENT: Head: Normocephalic and atraumatic.  Eyes: Conjunctivae and EOM are normal. Pupils are equal, round, and reactive to light.  Neck: Neck supple.  Cardiovascular: Normal rate and normal heart sounds.  Respiratory: Effort normal and breath sounds normal. No respiratory distress. CTAB today GI: Obese. Soft. NT. Bowel sounds are normal.  Musculoskeletal: +1 edema bilaterally. Normal ROM.  Neurological: She is alert and oriented to person, place, and time.  Skin: Skin is warm. Dry and chapped.    I have reviewed the patient's medications, labs, imaging, and diagnostic testing.  Notable results are summarized below. CBC    Component Value Date/Time   WBC 7.0 04/12/2013 0725   RBC 4.61 04/12/2013 0725   HGB 13.6 04/12/2013 0725   HCT 42.3 04/12/2013 0725   PLT 163 04/12/2013 0725   MCV 91.8 04/12/2013 0725   MCH 29.5 04/12/2013 0725   MCHC 32.2 04/12/2013 0725   RDW 14.6 04/12/2013 0725   LYMPHSABS 0.5* 04/11/2013 0707   MONOABS 0.4 04/11/2013 0707   EOSABS 0.0 04/11/2013 0707   BASOSABS 0.0 04/11/2013 0707     CMP     Component Value Date/Time   NA 142  04/12/2013 0725   K 2.8* 04/12/2013 0725   CL 97 04/12/2013 0725   CO2 39* 04/12/2013 0725   GLUCOSE 105* 04/12/2013 0725   BUN 12 04/12/2013 0725   CREATININE 0.69 04/12/2013 0725   CREATININE 0.79 12/01/2012 1709   CALCIUM 9.5 04/12/2013 0725   PROT 7.7 04/11/2013 0707   ALBUMIN 3.5 04/11/2013 0707   AST 13 04/11/2013 0707   ALT 11 04/11/2013 0707   ALKPHOS 84 04/11/2013 0707   BILITOT 0.5 04/11/2013 0707   GFRNONAA >90 04/12/2013 0725   GFRAA >90 04/12/2013 0725    04/11/2013  CXR  IMPRESSION: No evidence of acute cardiopulmonary disease.   Original Report Authenticated By: Harmon Pier, M.D.     04/11/2013  CTA Chest IMPRESSION:  1. No filling defect is identified in the pulmonary arterial tree to suggest pulmonary embolus.  Sensitivity is reduced due to patient body habitus. 2.  Soft tissue density structure along the inferior lobe of the thyroid gland, probably a thyroid nodule, although I cannot completely exclude an adjacent enlarged lymph node.  Consider none urgent follow-up thyroid sonography.   Original Report Authenticated By: Gaylyn Rong, M.D.     Assessment/Plan: 52 yo morbidly obese female with history of COPD here with increased fatigue and drowsiness found to be hypoxic, on many sedating medications and requiring 2LNC  supplementation. Patient is not on home oxygen currently.    1. Hypoxia: Likely 2/2 to COPD along with obesity hypoventilation syndrome resulting in CO2 retention. Ammonia level WNL. BNP 106.1 - UDS pending - Hold sedating mediations: Trazodone and  Hydroxyzine on admission --> patient was mentally clear this morning, will add back trazodone 75 mg.  - ABG: Received verbal result from RT of ABG that was done on 04/11/13. Results were unable to be charted yesterday due to cancelled order in computer. Results are: PH: 7.45, pO2: 60.6, pCO2: 59, HCO3: 40.8 - CPAP trial this evening. Has been recommended to have home O2 and sleep study in the past but it does not appear  this has been obtained. She reports she does not think she could handle being hooked up to all those wires and try to sleep. Hoping with trial this evening she may see clinical improvement and decide on having study completed. - She likely will need home O2, will consult case management to help with arranging this as well once PT has had time to evaluate her in ambulation and weaning to room air is attempted today. - CT negative for PE.   2. Hypertension: BP appears well controlled at this time. Only using torsemide and metolazone for chronic LE swelling. Only using metolazone qweek. Will continue only torsemide for now.   3. Elevated glucose: Glucose 168- 105 on labwork from today. Reports dry mouth. Polyuria but takes daily torsemide.  - A1c: 5.8  4. Leukocytosis/Elevated temp: Elevated WBC to 11.5 on admission --> 7.0. Also with rectal temp recorded at 100.3. She denies any fever at home. No clear source of infection. Skin does not appear cellulitic around legs or below pannus.  - Urine and CXR are normal. -  Could consider DVT with possible PE given elevated temp with chronic LE edema.   5. Chronic pain: Continue home tramadol, lyrica and tylenol prn.   6. Depression: Continue home effxor and abilify.   7. FEN/GI: Regular diet for now. Hypokalemia noted, will replete  8. PPX: Lovenox 80 9. Dispo: Awaiting PT recommendations, and evaluation for need of supplemental oxygen/CPAP  Felix Pacini, DO

## 2013-04-13 DIAGNOSIS — R0902 Hypoxemia: Secondary | ICD-10-CM | POA: Diagnosis present

## 2013-04-13 LAB — BASIC METABOLIC PANEL
CO2: 33 mEq/L — ABNORMAL HIGH (ref 19–32)
Chloride: 102 mEq/L (ref 96–112)
Glucose, Bld: 114 mg/dL — ABNORMAL HIGH (ref 70–99)
Potassium: 3.8 mEq/L (ref 3.5–5.1)
Sodium: 141 mEq/L (ref 135–145)

## 2013-04-13 LAB — DRUGS OF ABUSE SCREEN W/O ALC, ROUTINE URINE
Barbiturate Quant, Ur: NEGATIVE
Cocaine Metabolites: NEGATIVE
Creatinine,U: 128.8 mg/dL
Opiate Screen, Urine: NEGATIVE
Phencyclidine (PCP): NEGATIVE
Propoxyphene: NEGATIVE

## 2013-04-13 MED ORDER — ARIPIPRAZOLE 2 MG PO TABS: 2.0000 mg | ORAL_TABLET | Freq: Every day | ORAL | Status: DC

## 2013-04-13 NOTE — Care Management Note (Signed)
CARE MANAGEMENT NOTE 04/13/2013  Patient:  Kathleen Roy, Kathleen Roy   Account Number:  [id number]  Date Initiated:  04/13/2013  Documentation initiated by:  Vance Peper  Subjective/Objective Assessment:   52 yr old female admitted for SOB/hypoxia/weakness     Action/Plan:   CM spoke with patient concerning home health needs and DME needs. Choice offered. patient is declining bariatric walker-it will not fit through her doorways. Oxygen ordered. Sats 88% with ambulation and at rest.   Anticipated DC Date:  04/13/2013   Anticipated DC Plan:  HOME W HOME HEALTH SERVICES      DC Planning Services  CM consult      [facility] Choice  HOME HEALTH   Choice offered to / List presented to:  C-1 Patient   DME arranged  [facility]      DME agency  [facility]     HH arranged  HH-2 PT      [facility] agency  [facility]   Status of service:  Completed, signed off Medicare Important Message given?   (If response is "NO", the following Medicare IM given date fields will be blank) Date Medicare IM given:   Date Additional Medicare IM given:    Discharge Disposition:  HOME W HOME HEALTH SERVICES  Per UR Regulation:    If discussed at Long Length of Stay Meetings, dates discussed:    Comments:

## 2013-04-13 NOTE — Progress Notes (Signed)
Pt discharged to home accompanied by family. Discharge instructions were given and explained and pt stated understanding. Pts IV was removed prior to discharge. Pt left unit in a stable condition via wheelchair.

## 2013-04-13 NOTE — Discharge Summary (Signed)
Physician Discharge Summary  Patient ID: Kathleen Roy MRN: [id number] DOB/AGE: 1961-06-10 51 y.o.  Admit date: 04/11/2013 Discharge date: 04/13/2013  Admission Diagnoses: hypoxemia   Discharge Diagnoses:  Principal Problem:   Hypoxemia Active Problems:   Morbid obesity   DEPRESSION, MAJOR, RECURRENT   PANIC ATTACKS   HYPERTENSION, BENIGN SYSTEMIC   CONSTIPATION, CHRONIC   EDEMA, CHRONIC   COPD (chronic obstructive pulmonary disease)   Peripheral neuropathy   Discharged Condition: good  Hospital Course:  Kathleen Roy is a 52 y.o. morbidly obese female with history of COPD here with increased fatigue and drowsiness found to be hypoxi, on many sedating medications and requiring 4 LNC supplementation on admission. ABG on admission was  PH: 7.45, pO2: 60.6, pCO2: 59, HCO3: 40.8 . CT negative for PE. CXR showed no acute process.  Patient was not on home oxygen prior to admission.   1. Hypoxia: Likely 2/2 to COPD along with obesity hypoventilation syndrome resulting in CO2 retention. Ammonia level WNL. BNP 106.1 and UDS was Negative.We held sedating medications on admission. Patient was taking vistaril, lyrica, trazodone, tramadol, and effexor. She was more alert the day after admission and trazodone was slowly added back and then continued at full dose on discharge. Patient is unable to sleep without it, per her report. CPAP was ordered, but was not placed. Sleep studies have been recommended to her in the past and she originally was not willing to attempt them because of anxiety, however she states she will have one completed now as outpatient.   2. Hypertension: BP appears well controlled at this time. Only using torsemide and metolazone for chronic LE swelling. Only using metolazone qweek.  3. Initially had an elevated glucose, but resolved. Glucose 114 on labwork from day of discharge. Reports dry mouth. Polyuria but takes daily torsemide. A1c: 5.8  4. Leukocytosis/Elevated temp: Elevated  WBC to 11.5 on admission --> 7.0. Also with rectal temp recorded at 100.3. She denies any fever at home. No clear source of infection. Skin does not appear cellulitic around legs or below pannus. Urine and CXR are normal.  5. Chronic pain: Continue home tramadol and tylenol prn.  6. Depression: Continue home effxor and abilify at 1/2 dose.   PT recommended home health PT and 3:1 commode. She experienced desaturations with ambulation <88%. This was set up prior to discharge Consults: None  Significant Diagnostic Studies:  Dg Chest 2 View 04/11/2013    IMPRESSION: No evidence of acute cardiopulmonary disease.   Original Report Authenticated By: Harmon Pier, M.D.    Ct Angio Chest Pe W/cm &/or Wo Cm 04/11/2013    IMPRESSION:  1. No filling defect is identified in the pulmonary arterial tree to suggest pulmonary embolus.  Sensitivity is reduced due to patient body habitus. 2.  Soft tissue density structure along the inferior lobe of the thyroid gland, probably a thyroid nodule, although I cannot completely exclude an adjacent enlarged lymph node.  Consider none urgent follow-up thyroid sonography.   Original Report Authenticated By: Gaylyn Rong, M.D.     Treatments: IV hydration and oxygen  Discharge Exam: Blood pressure 134/68, pulse 76, temperature 97.8 F (36.6 C), temperature source Rectal, resp. rate 16, height 5\' 2"  (1.575 m), weight 340 lb (154.223 kg), SpO2 96.00%. Constitutional: She is oriented to person, place, and time. Morbidly obese.  HENT: Head: Normocephalic and atraumatic.  Eyes: Conjunctivae and EOM are normal. Pupils are equal, round, and reactive to light.  Neck: Neck supple.  Cardiovascular: Normal  rate and normal heart sounds.  Respiratory: Effort normal and breath sounds normal. No respiratory distress. CTAB today  GI: Obese. Soft. NT. Bowel sounds are normal.  Musculoskeletal: +1 edema bilaterally. Normal ROM.  Neurological: She is alert and oriented to person,  place, and time.  Skin: Skin is warm. Dry and chapped.    Disposition:       Discharge Orders   Future Appointments Provider Department Dept Phone   04/19/2013 4:00 PM Macy Mis, MD [facility] [phone]   Future Orders Complete By Expires     Diet - low sodium heart healthy  As directed     Diet - low sodium heart healthy  As directed     Increase activity slowly  As directed     Increase activity slowly  As directed         Medication List    STOP taking these medications       pregabalin 150 MG capsule  Commonly known as:  LYRICA     VISTARIL 50 MG capsule  Generic drug:  hydrOXYzine      TAKE these medications       acetaminophen 500 MG tablet  Commonly known as:  TYLENOL  Take 500 mg by mouth every 6 (six) hours as needed for pain.     ARIPiprazole 2 MG tablet  Commonly known as:  ABILIFY  Take 1 tablet (2 mg total) by mouth daily.     EFFEXOR XR 150 MG 24 hr capsule  Generic drug:  venlafaxine XR  Take 300 mg by mouth daily. (PER MENTAL HEALTH)     ibuprofen 200 MG tablet  Commonly known as:  ADVIL,MOTRIN  Take 400 mg by mouth 2 (two) times daily.     metolazone 2.5 MG tablet  Commonly known as:  ZAROXOLYN  Take 2.5 mg by mouth once a week. Takes on a weekend day     nystatin 100000 UNIT/GM Powd  Apply 0.5 g topically 2 (two) times daily.     potassium chloride SA 20 MEQ tablet  Commonly known as:  K-DUR,KLOR-CON  Take 40 mEq by mouth daily.     pravastatin 40 MG tablet  Commonly known as:  PRAVACHOL  Take 40 mg by mouth at bedtime.     torsemide 20 MG tablet  Commonly known as:  DEMADEX  Take 60 mg by mouth daily.     traMADol 50 MG tablet  Commonly known as:  ULTRAM  Take 150 mg by mouth 2 (two) times daily.     trazodone 300 MG tablet  Commonly known as:  DESYREL  Take 300 mg by mouth at bedtime. (PER MENTAL HEALTH)       Outpatient Recommendations: - Outpatient sleep study for possible CPAP need.  -  Patient was discharged with home oxygen supplementation, needing 1.5LNC - PT recommended home health PT and 3:1 commode. This was set up prior to discharge - Patient did not find benefit from Lyrica and wanted to try gabapentin, Lyrica was discontinued on discharge. We explained this would be better tried as an outpatient. - Abilify was half dosed on discharge, Effexor and trazodone was continued. Vistaril was discontinued until she sees her PCP.  Signed: Kuneff, Renee 04/13/2013, 8:15 PM

## 2013-04-13 NOTE — Progress Notes (Signed)
SATURATION QUALIFICATIONS: (This note is used to comply with regulatory documentation for home oxygen)  Patient Saturations on Room Air at Rest = 88%      

## 2013-04-13 NOTE — Progress Notes (Signed)
FMTS Attending Note Patient seen and examined by me, discussed with resident team and I agree with Dr Alan Ripper assess/plan.  Patient reports she is having trouble sleeping without full doses of her trazodone and other medications.  No longer somnolent.  Did not have trial of CPAP last night.  I am in favor of titrating up her outpatient medications and discharging to home, for outpatient arrangement of split-night polysomnography.  Caution against over-sedation in the setting of presumed OSA.  Paula Compton, MD

## 2013-04-13 NOTE — Progress Notes (Signed)
Patient ID: Kathleen Roy, female   DOB: 02/16/1961, 52 y.o.   MRN: [id number] Family Medicine Teaching Service Daily Progress Note Service Page: [phone]   Subjective:  Patient reports she did not sleep again last night. She also reports she did not get her CPAP trial last night.   I called RT and they report it was not done, reason given possibly it was missed by team that excepted hand-off.  Objective: Temp:  [97.8 F (36.6 C)-98.8 F (37.1 C)] 97.8 F (36.6 C) (04/29 0546) Pulse Rate:  [74-79] 76 (04/29 0546) Cardiac Rhythm:  [-]  Resp:  [16] 16 (04/29 0546) BP: (128-134)/(68-76) 134/68 mmHg (04/29 0546) SpO2:  [92 %-97 %] 96 % (04/29 0546)  Intake/Output Summary (Last 24 hours) at 04/13/13 0937 Last data filed at 04/13/13 0459  Gross per 24 hour  Intake 6762.5 ml  Output    200 ml  Net 6562.5 ml   BP 134/68  Pulse 76  Temp(Src) 97.8 F (36.6 C) (Rectal)  Resp 16  Ht 5\' 2"  (1.575 m)  Wt 340 lb (154.223 kg)  BMI 62.17 kg/m2  SpO2 96%  Exam: Constitutional: She is oriented to person, place, and time. Morbidly obese.  HENT: Head: Normocephalic and atraumatic.  Eyes: Conjunctivae and EOM are normal. Pupils are equal, round, and reactive to light.  Neck: Neck supple.  Cardiovascular: Normal rate and normal heart sounds.  Respiratory: Effort normal and breath sounds normal. No respiratory distress. CTAB today GI: Obese. Soft. NT. Bowel sounds are normal.  Musculoskeletal: +1 edema bilaterally. Normal ROM.  Neurological: She is alert and oriented to person, place, and time.  Skin: Skin is warm. Dry and chapped.    I have reviewed the patient's medications, labs, imaging, and diagnostic testing.  Notable results are summarized below. CBC    Component Value Date/Time   WBC 7.0 04/12/2013 0725   RBC 4.61 04/12/2013 0725   HGB 13.6 04/12/2013 0725   HCT 42.3 04/12/2013 0725   PLT 163 04/12/2013 0725   MCV 91.8 04/12/2013 0725   MCH 29.5 04/12/2013 0725   MCHC 32.2  04/12/2013 0725   RDW 14.6 04/12/2013 0725   LYMPHSABS 0.5* 04/11/2013 0707   MONOABS 0.4 04/11/2013 0707   EOSABS 0.0 04/11/2013 0707   BASOSABS 0.0 04/11/2013 0707     CMP     Component Value Date/Time   NA 141 04/13/2013 0758   K 3.8 04/13/2013 0758   CL 102 04/13/2013 0758   CO2 33* 04/13/2013 0758   GLUCOSE 114* 04/13/2013 0758   BUN 10 04/13/2013 0758   CREATININE 0.66 04/13/2013 0758   CREATININE 0.79 12/01/2012 1709   CALCIUM 9.1 04/13/2013 0758   PROT 7.7 04/11/2013 0707   ALBUMIN 3.5 04/11/2013 0707   AST 13 04/11/2013 0707   ALT 11 04/11/2013 0707   ALKPHOS 84 04/11/2013 0707   BILITOT 0.5 04/11/2013 0707   GFRNONAA >90 04/13/2013 0758   GFRAA >90 04/13/2013 0758    04/11/2013  CXR  IMPRESSION: No evidence of acute cardiopulmonary disease.   Original Report Authenticated By: Harmon Pier, M.D.     04/11/2013  CTA Chest IMPRESSION:  1. No filling defect is identified in the pulmonary arterial tree to suggest pulmonary embolus.  Sensitivity is reduced due to patient body habitus. 2.  Soft tissue density structure along the inferior lobe of the thyroid gland, probably a thyroid nodule, although I cannot completely exclude an adjacent enlarged lymph node.  Consider none urgent follow-up thyroid  sonography.   Original Report Authenticated By: Gaylyn Rong, M.D.     Assessment/Plan: 52 yo morbidly obese female with history of COPD here with increased fatigue and drowsiness found to be hypoxic, on many sedating medications and requiring 1.5 LNC supplementation. Patient is not on home oxygen currently.  CPAP trial was not completed last night, although it was ordered. Spoke with Luisa Hart, in RT, and he stated he would check into reasoning behind the order being ignored.   1. Hypoxia: Likely 2/2 to COPD along with obesity hypoventilation syndrome resulting in CO2 retention. Ammonia level WNL. BNP 106.1 - UDS Negative - Hold  Hydroxyzine on admission d/t sedating qualities.  - Added back 75 mg  of trazodone last night, without appropriate effect. Will increase today.  - ABG: Received verbal result from RT of ABG that was done on 04/11/13. Results were unable to be charted yesterday due to cancelled order in computer. Results are: PH: 7.45, pO2: 60.6, pCO2: 59, HCO3: 40.8 - CPAP trial last night was ignored. Will attempt again tonight if still inpatient status, spoke with Luisa Hart in [facility] today. She has been recommended to have home O2 and sleep study in the past but it does not appear this has been obtained. She reports she does not think she could handle being hooked up to all those wires and try to sleep. Today she reports she will did what is necessary and have the study completed if we think it will be helpful. \ - She likely will need home O2, will consult case management to help with arranging this as well once PT has had time to evaluate her in ambulation and weaning to room air is attempted again today. - CT negative for PE.   2. Hypertension: BP appears well controlled at this time. Only using torsemide and metolazone for chronic LE swelling. Only using metolazone qweek.   3. Elevated glucose: Glucose 114 on labwork from today. Reports dry mouth. Polyuria but takes daily torsemide. --> resolved - A1c: 5.8  4. Leukocytosis/Elevated temp: Elevated WBC to 11.5 on admission --> 7.0. Also with rectal temp recorded at 100.3. She denies any fever at home. No clear source of infection. Skin does not appear cellulitic around legs or below pannus.  - Urine and CXR are normal.  5. Chronic pain: Continue home tramadol, lyrica and tylenol prn.   6. Depression: Continue home effxor and abilify.   7. FEN/GI: Regular diet for now. Hypokalemia resolved today 8. PPX: Lovenox 80 9. Dispo: Awaiting PT recommendations, and evaluation for need of supplemental oxygen/CPAP  Felix Pacini, DO

## 2013-04-13 NOTE — Progress Notes (Signed)
Physical Therapy Treatment Patient Details Name: Kathleen Roy MRN: [id number] DOB: 04/29/1961 Today's Date: 04/13/2013 Time: 7829-5621 PT Time Calculation (min): 10 min  PT Assessment / Plan / Recommendation Comments on Treatment Session  Pt planning to D/C home today. Discussed home management techniques and D/C planning for safe return. Pt deferred amb and stair management due to fatigue and wanting to reserve energy for D/C home. Recommend HHPT.    Follow Up Recommendations        Does the patient have the potential to tolerate intense rehabilitation     Barriers to Discharge        Equipment Recommendations  None recommended by PT    Recommendations for Other Services    Frequency Min 3X/week   Plan Discharge plan remains appropriate;Frequency remains appropriate    Precautions / Restrictions     Pertinent Vitals/Pain No pain reported today; pt on 1L O2 stats at 97%    Mobility       Exercises Other Exercises Other Exercises: Discussed home safety techniques as well as car transfer techniques for safety. Pt presented with high anxiety about returning home. Measured out bariatric RW and pt called brother to measure if RW would fit into the house. The RW was not going to fit into the house. Discussed with pt benefits of HHPT upon D/C; pt reluctant at beginning but was agreeable at end of discussion. Encouarged pt to use SPC at all times when hom to reduce risk of falls; pt agreeable.    PT Diagnosis:    PT Problem List:   PT Treatment Interventions:     PT Goals Acute Rehab PT Goals PT Goal Formulation: With patient Time For Goal Achievement: 04/19/13 Potential to Achieve Goals: Fair  Visit Information  Last PT Received On: 04/13/13 Assistance Needed: +2    Subjective Data  Subjective: "i just found out i am going home. I cant believe it. I think i Should save my energy for my steps at home. " Pt agreeable to discussing home management techniques  Patient Stated  Goal: home today safely   Cognition  Cognition Arousal/Alertness: Awake/alert Behavior During Therapy: WFL for tasks assessed/performed Overall Cognitive Status: Within Functional Limits for tasks assessed    Balance     End of Session PT - End of Session Equipment Utilized During Treatment: Oxygen Patient left: in bed;with call bell/phone within reach Nurse Communication: Other (comment) (DME needs upon D/C)   GP     Donell Sievert, [place] [phone] 04/13/2013, 3:35 PM

## 2013-04-15 MED ORDER — POTASSIUM CHLORIDE CRYS ER 20 MEQ PO TBCR: 60.0000 meq | EXTENDED_RELEASE_TABLET | Freq: Every day | ORAL | Status: DC

## 2013-04-15 NOTE — Telephone Encounter (Signed)
Has this been addressed yet?

## 2013-04-15 NOTE — Telephone Encounter (Signed)
Called patient-  She had been taking 3 tablets daily chronically.  At admission, potassium was low.  Will continue 3 tabs a day.

## 2013-04-15 NOTE — Telephone Encounter (Signed)
Will forward to PCP to advise

## 2013-04-19 ENCOUNTER — Ambulatory Visit: Payer: Medicare Other | Admitting: Family Medicine

## 2013-04-22 ENCOUNTER — Other Ambulatory Visit: Payer: Self-pay | Admitting: Family Medicine

## 2013-04-22 MED ORDER — TRAMADOL HCL 50 MG PO TABS: 150.0000 mg | ORAL_TABLET | Freq: Two times a day (BID) | ORAL | Status: DC

## 2013-04-22 NOTE — Addendum Note (Signed)
Addended by: Macy Mis on: 04/22/2013 03:56 PM   Modules accepted: Orders, Medications

## 2013-04-28 ENCOUNTER — Telehealth: Payer: Self-pay | Admitting: Family Medicine

## 2013-04-28 DIAGNOSIS — R0902 Hypoxemia: Secondary | ICD-10-CM

## 2013-04-28 DIAGNOSIS — G629 Polyneuropathy, unspecified: Secondary | ICD-10-CM

## 2013-04-28 MED ORDER — GABAPENTIN 300 MG PO CAPS: 300.0000 mg | ORAL_CAPSULE | Freq: Three times a day (TID) | ORAL | Status: DC

## 2013-04-28 NOTE — Assessment & Plan Note (Addendum)
Patient had lyrica discontinued during hospitalization due to hypoxia/somnolence.  She did not think it was helping but since being off it for several weeks, has noted worsening of her baseline symptoms.  Would like to try gabapentin- suggested by inpatient team.  Discussed risks of somnolence.  Will start at 300 mg qday to titrate up to TID.  Will follow-up with new PCP to discuss further titration as needed.

## 2013-04-28 NOTE — Telephone Encounter (Signed)
Pt is asking to speak with Dr Earnest Bailey - tramadol only has enough for 25 days worth - not enough for the month

## 2013-04-28 NOTE — Telephone Encounter (Signed)
Need to discuss medication Lyrica and her oxygen.  Patient will not be able to see provider before provider leaves.  [facility] Card recert appt is in June.

## 2013-04-28 NOTE — Assessment & Plan Note (Signed)
Patient reports not wearing oxygen and not feeling differently with or without oxygen.  Encouraged her to wear regularly and to schedule sleep study.  She declines at this time- would like to think about this and discuss with new MD. Will follow-up next month.

## 2013-04-29 ENCOUNTER — Telehealth: Payer: Self-pay | Admitting: Family Medicine

## 2013-04-29 MED ORDER — TRAMADOL HCL 50 MG PO TABS: 150.0000 mg | ORAL_TABLET | Freq: Two times a day (BID) | ORAL | Status: DC

## 2013-04-29 NOTE — Telephone Encounter (Signed)
Patient is calling about the oxygen that she was sent home with.  She called [facility] about having them pick up the Oxygen but they need Dr. Earnest Bailey write something on a prescription pad discontinuing the oxygen and it needs to be faxed to [phone].

## 2013-04-29 NOTE — Telephone Encounter (Signed)
I recommended to her that she use it daily until seen by new PCP to discuss further.

## 2013-04-29 NOTE — Telephone Encounter (Signed)
Have sent in corrected rx

## 2013-05-03 DIAGNOSIS — IMO0002 Reserved for concepts with insufficient information to code with codable children: Secondary | ICD-10-CM | POA: Diagnosis not present

## 2013-05-03 DIAGNOSIS — F410 Panic disorder [episodic paroxysmal anxiety] without agoraphobia: Secondary | ICD-10-CM | POA: Diagnosis not present

## 2013-05-03 NOTE — Telephone Encounter (Signed)
Pt. Notified.  .Kathleen Roy  

## 2013-05-19 ENCOUNTER — Telehealth: Payer: Self-pay | Admitting: Family Medicine

## 2013-05-19 ENCOUNTER — Other Ambulatory Visit: Payer: Self-pay | Admitting: Family Medicine

## 2013-05-19 MED ORDER — PREGABALIN 75 MG PO CAPS: 75.0000 mg | ORAL_CAPSULE | Freq: Every day | ORAL | Status: DC

## 2013-05-19 MED ORDER — PREGABALIN 75 MG PO CAPS: 75.0000 mg | ORAL_CAPSULE | Freq: Two times a day (BID) | ORAL | Status: DC

## 2013-05-19 NOTE — Telephone Encounter (Signed)
I actually started her back on Lyrica 75 mg BID,that was what she was taking before.

## 2013-05-19 NOTE — Telephone Encounter (Signed)
Patient is calling because at the end of April she was in the hospital and she was taken off of Lyrica.  In May, Dr. Earnest Bailey put hr on Neurontin for nerve pain, but it doesn't seem to be helping so she wants to know if it is ok to go back on Lyrica.  She does have some Lyrica left, she doesn't need any sent in for her.

## 2013-05-19 NOTE — Telephone Encounter (Addendum)
I reviewed her record,Lyrica was held for somnolence in a patient with risk for hypoxia,but she is currently on Gabapentin which will also cause somnolence,if she is doing fine on Gabapentin and she is compliant with her O2,she might as well switch back to Lyrica which works better for her.  1. May D/C Gabapentin. 2. Restart Lyrica 75 mg 1 tablet daily, I will gradually taper this up at her next visit if no improvement with her neuropathic pain. 3. Advise to be compliant with her home oxygen. 4. Ensure follow up with me for further medication management.  I refilled Lyrica.

## 2013-05-19 NOTE — Telephone Encounter (Signed)
Will FWD to MD for review.  Vernetta Dizdarevic L, CMA  

## 2013-05-19 NOTE — Telephone Encounter (Signed)
LMOVM for patient to return call.  Jessic Standifer, Darlyne Russian, CMA

## 2013-05-19 NOTE — Telephone Encounter (Signed)
Spoke with patient and told her she could restart the Lyrica.  When I advised patient to be compliant with her home O2, she stated that [facility] has picked that up, per patient request, in May, because patient did not need it.  She had gotten her strength and stamina back.  Will fwd this msg to MD.  Radene Ou, CMA

## 2013-05-19 NOTE — Telephone Encounter (Signed)
E-scribed Lyrica 75 mg BID,I spoke with pharmacy to verify medication.

## 2013-05-19 NOTE — Telephone Encounter (Signed)
Called pt back.  Advised that she would need an appt to discuss the med change since MD has never met her.  Pt is agreeable but there is a slight problem.  1.  Her orange card expired May 1st and she cant get in to see Britta Mccreedy until June 17.  We made an appt to see [place] on the 26th but would like to know about meds in the mean time.  2.  Per pt report she is taking   -Gabapentin 2x daily and does not want to increase to 3x daily  -She was taken off lyrica and hydroxyzine @ her last Hospital stay 2/2 "being overmedicated"   3.  She would like to stop gabapentin and restart lyrica.  She did not think that the lyrica was working at the time, but against the gabapentin it works much better.  Advised I would send message to MD to inquire about the meds. [place], Maryjo Rochester

## 2013-05-26 ENCOUNTER — Other Ambulatory Visit: Payer: Self-pay | Admitting: Family Medicine

## 2013-06-10 ENCOUNTER — Ambulatory Visit: Payer: Medicare Other | Admitting: Family Medicine

## 2013-06-17 ENCOUNTER — Ambulatory Visit (INDEPENDENT_AMBULATORY_CARE_PROVIDER_SITE_OTHER): Payer: Medicare Other | Admitting: Family Medicine

## 2013-06-17 ENCOUNTER — Encounter: Payer: Self-pay | Admitting: Family Medicine

## 2013-06-17 VITALS — BP 132/83 | HR 97 | Temp 98.1°F | Ht 62.0 in | Wt 338.0 lb

## 2013-06-17 DIAGNOSIS — Z0000 Encounter for general adult medical examination without abnormal findings: Secondary | ICD-10-CM | POA: Diagnosis not present

## 2013-06-17 DIAGNOSIS — M25569 Pain in unspecified knee: Secondary | ICD-10-CM | POA: Diagnosis not present

## 2013-06-17 DIAGNOSIS — J449 Chronic obstructive pulmonary disease, unspecified: Secondary | ICD-10-CM | POA: Diagnosis not present

## 2013-06-17 DIAGNOSIS — I10 Essential (primary) hypertension: Secondary | ICD-10-CM

## 2013-06-17 DIAGNOSIS — E785 Hyperlipidemia, unspecified: Secondary | ICD-10-CM | POA: Diagnosis not present

## 2013-06-17 DIAGNOSIS — J4489 Other specified chronic obstructive pulmonary disease: Secondary | ICD-10-CM

## 2013-06-17 NOTE — Assessment & Plan Note (Signed)
To follow up fasting for LDL. Continue Pravachol.

## 2013-06-17 NOTE — Assessment & Plan Note (Signed)
Likely due to her heavy weight. WIll discuss nutritionist referral at next visit. She will benefit from bariatric surgery. I discussed with her is on high dose of tramadol,also tylenol can cause liver disease. Patient stated she feels better with this dose. I suggested to change to Tramadol 50 mg 3 tab BID prn instead. Consider xray knee next visit and PT referral.

## 2013-06-17 NOTE — Progress Notes (Signed)
Subjective:     Patient ID: Kathleen Roy, female   DOB: 1961-04-27, 52 y.o.   MRN: [id number]  HPI COPD:Patient is not on any long acting medication,she uses albuterol prn which she had not used in a long time,she denies SOB,no chest tightness,she self discontinued her home oxygen. HTN:On Metolazone and Torsemide. JYN:[id number] with Pravachol 40 mg qd. Knee pain: C/O b/l knee pain on going for many years,she denies any form of injury,pain mostly is about 8/10 in severity,worse with weight bearing,she uses Tramadol 50 mg 3 tab in am,2 tab in pm and 3 tab at night,this help with her pain,at times she will also take Tylenol as well. QM:VHQIONG has not had colposcopy,mammogram or PAP done.  Current Outpatient Prescriptions on File Prior to Visit  Medication Sig Dispense Refill  . acetaminophen (TYLENOL) 500 MG tablet Take 500 mg by mouth every 6 (six) hours as needed for pain.       . ARIPiprazole (ABILIFY) 2 MG tablet Take 1 tablet (2 mg total) by mouth daily.  30 tablet  0  . ibuprofen (ADVIL,MOTRIN) 200 MG tablet Take 400 mg by mouth 2 (two) times daily.      Marland Kitchen KLOR-CON M20 20 MEQ tablet take 3 tablets by mouth once daily  90 tablet  0  . metolazone (ZAROXOLYN) 2.5 MG tablet Take 2.5 mg by mouth once a week. Takes on a weekend day  30 tablet  2  . nystatin (MYCOSTATIN/NYSTOP) 100000 UNIT/GM POWD Apply 0.5 g topically 2 (two) times daily.      . pravastatin (PRAVACHOL) 40 MG tablet Take 40 mg by mouth at bedtime.      . pregabalin (LYRICA) 75 MG capsule Take 1 capsule (75 mg total) by mouth 2 (two) times daily.  60 capsule  0  . torsemide (DEMADEX) 20 MG tablet Take 60 mg by mouth daily.      . trazodone (DESYREL) 300 MG tablet Take 300 mg by mouth at bedtime. [facility])      . venlafaxine (EFFEXOR XR) 150 MG 24 hr capsule Take 300 mg by mouth daily. [facility])       No current facility-administered medications on file prior to visit.   Past Medical History  Diagnosis Date   . Normal echocardiogram 05/30/05    suboptimal study  . Obesity, morbid (more than 100 lbs over ideal weight or BMI > 40)     obese since childhood  . Fibromyalgia   . Chronic pain   . Hyperlipidemia   . Sepsis   . Depression   . Sepsis 2002      Review of Systems  Respiratory: Negative.  Negative for cough, chest tightness, shortness of breath and wheezing.   Cardiovascular: Negative.   Gastrointestinal: Negative.   Musculoskeletal: Positive for gait problem.       Knee pain  All other systems reviewed and are negative.   Filed Vitals:   06/17/13 1539  BP: 132/83  Pulse: 97  Temp: 98.1 F (36.7 C)  TempSrc: Oral  Height: 5\' 2"  (1.575 m)  Weight: 338 lb (153.316 kg)       Objective:   Physical Exam  Nursing note and vitals reviewed. Constitutional: She is oriented to person, place, and time. She appears well-developed. No distress.  Morbidly obesed  Cardiovascular: Normal rate, regular rhythm and normal heart sounds.   No murmur heard. Pulmonary/Chest: Effort normal and breath sounds normal. No respiratory distress. She has no wheezes. She exhibits no  tenderness.  Abdominal: Soft. Bowel sounds are normal. There is no tenderness.  Musculoskeletal: Normal range of motion.  Puffy foot,non-pitting B/L LL swelling with 2-3 spots of peeling/break in skin.  Neurological: She is alert and oriented to person, place, and time.       Assessment/Plan:     COPD: HTN: HLD: Knee pain: ZO:[id number] cancer and colon cancer screening

## 2013-06-17 NOTE — Assessment & Plan Note (Signed)
On Albuterol prn. Condition stable. I recommend repeat PFT. Referral to Dr Danice Goltz for PFT testing.

## 2013-06-17 NOTE — Assessment & Plan Note (Signed)
Mammogram ordered, I referred her to GI for colonoscopy. Discuss PAP at next visit.

## 2013-06-17 NOTE — Assessment & Plan Note (Signed)
BP optimal on med. Her Diuresis I suspect is mostly for her pedal edema. Continue current regimen.

## 2013-06-17 NOTE — Patient Instructions (Addendum)
Mammography  Mammography is an X-ray of the breasts to look for changes that are not normal. The X-ray image is called a mammogram. This procedure can screen for breast cancer, can detect cancer early, and can diagnose cancer.   LET YOUR CAREGIVER KNOW ABOUT:  · Breast implants.  · Previous breast disease, biopsy, or surgery.  · If you are breastfeeding.  · Medicines taken, including vitamins, herbs, eyedrops, over-the-counter medicines, and creams.  · Use of steroids (by mouth or creams).  · Possibility of pregnancy, if this applies.  RISKS AND COMPLICATIONS  · Exposure to radiation, but at very low levels.  · The results may be misinterpreted.  · The results may not be accurate.  · Mammography may lead to further tests.  · Mammography may not catch certain cancers.  BEFORE THE PROCEDURE  · Schedule your test about 7 days after your menstrual period. This is when your breasts are the least tender and have signs of hormone changes.  · If you have had a mammography done at a different facility in the past, get the mammogram X-rays or have them sent to your current exam facility in order to compare them.  · Wash your breasts and under your arms the day of the test.  · Do not wear deodorants, perfumes, or powders anywhere on your body.  · Wear clothes that you can change in and out of easily.  PROCEDURE  Relax as much as possible during the test. Any discomfort during the test will be very brief. The test should take less than 30 minutes. The following will happen:  · You will undress from the waist up and put on a gown.  · You will stand in front of the X-ray machine.  · Each breast will be placed between 2 plastic or glass plates. The plates will compress your breast for a few seconds.  · X-rays will be taken from different angles of the breast.  AFTER THE PROCEDURE  · The mammogram will be examined.  · Depending on the quality of the images, you may need to repeat certain parts of the test.  · Ask when your test  results will be ready. Make sure you get your test results.  · You may resume normal activities.  Document Released: 11/29/2000 Document Revised: 02/24/2012 Document Reviewed: 09/22/2011  [employer]® Patient Information ©2014 [employer].

## 2013-07-01 ENCOUNTER — Telehealth: Payer: Self-pay | Admitting: Family Medicine

## 2013-07-01 NOTE — Telephone Encounter (Signed)
Pt said that [facility] faxed a refill request for Klor-Con today 7/17. She was just checking the status JW

## 2013-07-01 NOTE — Telephone Encounter (Signed)
Will fwd to MD.  Zacchaeus Halm L, CMA  

## 2013-07-02 ENCOUNTER — Telehealth: Payer: Self-pay | Admitting: Family Medicine

## 2013-07-02 MED ORDER — POTASSIUM CHLORIDE CRYS ER 20 MEQ PO TBCR: 20.0000 meq | EXTENDED_RELEASE_TABLET | ORAL | Status: DC

## 2013-07-02 MED ORDER — POTASSIUM CHLORIDE CRYS ER 20 MEQ PO TBCR: 20.0000 meq | EXTENDED_RELEASE_TABLET | Freq: Every day | ORAL | Status: DC

## 2013-07-02 NOTE — Telephone Encounter (Signed)
Refill done, I reduced her dose to 1 tab every other day till she come in to get a recheck potassium.

## 2013-07-02 NOTE — Telephone Encounter (Signed)
Medication refill

## 2013-07-07 ENCOUNTER — Telehealth: Payer: Self-pay | Admitting: Family Medicine

## 2013-07-07 MED ORDER — PREGABALIN 150 MG PO CAPS: 150.0000 mg | ORAL_CAPSULE | Freq: Two times a day (BID) | ORAL | Status: DC

## 2013-07-07 NOTE — Telephone Encounter (Signed)
Pt is concerned about the dosage of her potassium medication. She feels that it could be wrong since she was taken three a day and now its one every other day. JW

## 2013-07-07 NOTE — Telephone Encounter (Signed)
Patient calls back stating also that since her dosage for Lyrica has been decreased, her pain from neuropathy has increases significantly. Wanting to know if it possible to go back to original dosage of 155 mg 2 x daily.

## 2013-07-07 NOTE — Telephone Encounter (Signed)
I cut back on her potassium dose pending follow up for repeat serum potassium to avoid risk of hyperkalemia. She may go back on Lyrica 150 bid. Medication refilled.

## 2013-07-07 NOTE — Telephone Encounter (Signed)
Patient had been on Lyrica 150 mg BID which helped with her neuropathy,however due to previous admission for hypoxia this was reduced,patient much better now,not on home O2. Will try her on Lyrica 150 mg BID,then monitor her O2 level at subsequent visit. I discussed side effects of medication with patient,she stated her pain is well controlled on Lyrican 150 bid and she is aware of the side effect. Patient instryucted to schedule follow up in 2-3wks for O2 sat check .

## 2013-07-14 ENCOUNTER — Other Ambulatory Visit: Payer: Self-pay | Admitting: Family Medicine

## 2013-07-27 ENCOUNTER — Ambulatory Visit: Payer: Medicare Other | Admitting: Pharmacist

## 2013-08-04 ENCOUNTER — Telehealth: Payer: Self-pay | Admitting: Family Medicine

## 2013-08-04 NOTE — Telephone Encounter (Signed)
Pt  Is calling because Dr. Lum Babe changed her medication from 150 mg lyrica a day to  75 mg day. She said this is not helping and she needs it back to 150 mg a day. She would like Dr. Lum Babe to take care of this today. JW

## 2013-08-04 NOTE — Telephone Encounter (Signed)
Please advise- Elizabeth Nikkia Devoss, RN-BSN  

## 2013-08-05 ENCOUNTER — Telehealth: Payer: Self-pay | Admitting: Family Medicine

## 2013-08-05 ENCOUNTER — Other Ambulatory Visit: Payer: Self-pay | Admitting: Family Medicine

## 2013-08-05 MED ORDER — PREGABALIN 150 MG PO CAPS: 150.0000 mg | ORAL_CAPSULE | Freq: Two times a day (BID) | ORAL | Status: DC

## 2013-08-05 NOTE — Telephone Encounter (Signed)
Saprina called because [facility] still does not have her prescription of lyrica. JW

## 2013-08-05 NOTE — Telephone Encounter (Signed)
Script verbally called into pharmacy. Wyatt Haste, RN-BSN

## 2013-08-05 NOTE — Telephone Encounter (Signed)
The plan is to taper up her Lyrica if not helping at current dose, I refilled her med,let her know.

## 2013-08-05 NOTE — Telephone Encounter (Signed)
Pt called and given message - verbalized understanding. Elizabeth Nissan Frazzini, RN-BSN  

## 2013-08-19 ENCOUNTER — Ambulatory Visit (INDEPENDENT_AMBULATORY_CARE_PROVIDER_SITE_OTHER): Payer: Medicare Other | Admitting: Family Medicine

## 2013-08-19 ENCOUNTER — Encounter: Payer: Self-pay | Admitting: Family Medicine

## 2013-08-19 VITALS — BP 143/82 | HR 99 | Temp 98.5°F | Wt 336.0 lb

## 2013-08-19 DIAGNOSIS — I10 Essential (primary) hypertension: Secondary | ICD-10-CM

## 2013-08-19 DIAGNOSIS — Z0000 Encounter for general adult medical examination without abnormal findings: Secondary | ICD-10-CM

## 2013-08-19 DIAGNOSIS — E876 Hypokalemia: Secondary | ICD-10-CM | POA: Diagnosis not present

## 2013-08-19 DIAGNOSIS — E785 Hyperlipidemia, unspecified: Secondary | ICD-10-CM

## 2013-08-19 DIAGNOSIS — J449 Chronic obstructive pulmonary disease, unspecified: Secondary | ICD-10-CM

## 2013-08-19 LAB — BASIC METABOLIC PANEL
BUN: 14 mg/dL (ref 6–23)
CO2: 39 mEq/L — ABNORMAL HIGH (ref 19–32)
Chloride: 93 mEq/L — ABNORMAL LOW (ref 96–112)
Glucose, Bld: 67 mg/dL — ABNORMAL LOW (ref 70–99)
Potassium: 3.5 mEq/L (ref 3.5–5.3)

## 2013-08-19 LAB — LIPID PANEL
LDL Cholesterol: 40 mg/dL (ref 0–99)
VLDL: 57 mg/dL — ABNORMAL HIGH (ref 0–40)

## 2013-08-19 MED ORDER — ALBUTEROL SULFATE HFA 108 (90 BASE) MCG/ACT IN AERS: 2.0000 | INHALATION_SPRAY | Freq: Four times a day (QID) | RESPIRATORY_TRACT | Status: DC | PRN

## 2013-08-19 NOTE — Assessment & Plan Note (Signed)
BP looks good today. Continue current regimen. 

## 2013-08-19 NOTE — Progress Notes (Signed)
Subjective:     Patient ID: Kathleen Roy, female   DOB: 1961-10-17, 52 y.o.   MRN: [id number]  HPI COPD:Not on any medication,had not used inhaler in a long time,she does have cough with sputum production on and off,no wheezing or chest tightness. HTN/HLD:Compliant with her medications,here for follow up. Hypokalemia: Had cut back on potassium to one every other day,here for K+ check. Wgt Management: Working on losing weight by diet only,she can not get much exercise due to knee pain. HM: Yet to go for mammogram and colonoscopy,she is concern her insurance might not pay for procedure. She has not gotten her flu shot.  Current Outpatient Prescriptions on File Prior to Visit  Medication Sig Dispense Refill  . acetaminophen (TYLENOL) 500 MG tablet Take 500 mg by mouth every 6 (six) hours as needed for pain.       . ARIPiprazole (ABILIFY) 2 MG tablet Take 1 tablet (2 mg total) by mouth daily.  30 tablet  0  . metolazone (ZAROXOLYN) 2.5 MG tablet Take 2.5 mg by mouth once a week. Takes on a weekend day  30 tablet  2  . potassium chloride SA (KLOR-CON M20) 20 MEQ tablet Take 1 tablet (20 mEq total) by mouth every other day.  30 tablet  0  . pravastatin (PRAVACHOL) 40 MG tablet Take 40 mg by mouth at bedtime.      . pregabalin (LYRICA) 150 MG capsule Take 1 capsule (150 mg total) by mouth 2 (two) times daily.  60 capsule  2  . torsemide (DEMADEX) 20 MG tablet Take 60 mg by mouth daily.      . traMADol (ULTRAM) 50 MG tablet Take 150 mg by mouth 2 (two) times daily as needed.      . trazodone (DESYREL) 300 MG tablet Take 300 mg by mouth at bedtime. [facility])      . venlafaxine (EFFEXOR XR) 150 MG 24 hr capsule Take 300 mg by mouth daily. [facility])      . nystatin (MYCOSTATIN/NYSTOP) 100000 UNIT/GM POWD Apply 0.5 g topically 2 (two) times daily.       No current facility-administered medications on file prior to visit.   Past Medical History  Diagnosis Date  . Normal  echocardiogram 05/30/05    suboptimal study  . Obesity, morbid (more than 100 lbs over ideal weight or BMI > 40)     obese since childhood  . Fibromyalgia   . Chronic pain   . Hyperlipidemia   . Sepsis   . Depression   . Sepsis 2002      Review of Systems  Respiratory: Negative.   Cardiovascular: Negative.   Gastrointestinal: Negative.   Neurological: Negative.   All other systems reviewed and are negative.   Filed Vitals:   08/19/13 1556  BP: 143/82  Pulse: 99  Temp: 98.5 F (36.9 C)  TempSrc: Oral  Weight: 336 lb (152.409 kg)       Objective:   Physical Exam  Nursing note and vitals reviewed. Constitutional: She is oriented to person, place, and time. She appears well-developed. No distress.  Obese   Cardiovascular: Normal rate, regular rhythm, normal heart sounds and intact distal pulses.   No murmur heard. Pulmonary/Chest: Effort normal and breath sounds normal. No respiratory distress. She has no wheezes. She exhibits no tenderness.  Abdominal: Soft. Bowel sounds are normal. She exhibits no distension and no mass. There is no tenderness.  Musculoskeletal: She exhibits edema.  Trace pedal edema  Neurological: She is alert and oriented to person, place, and time.       Assessment/Plan:     COPD HTN HLD Hypokalemia Weight management Health Maintenance

## 2013-08-19 NOTE — Assessment & Plan Note (Signed)
Medication induced. K+ checked today. Will adjust potassium replacement based on result.

## 2013-08-19 NOTE — Assessment & Plan Note (Signed)
Patient lost 2 lbs from last visit. I commended her and encouraged her. SHe will continue to work on weight loss which will help improve her other chronic problems.

## 2013-08-19 NOTE — Assessment & Plan Note (Signed)
I again advised she schedule colonoscopy and mammogram. SHe agreed to make appointments as soon as possible. She declined flu shot today.

## 2013-08-19 NOTE — Patient Instructions (Signed)

## 2013-08-19 NOTE — Assessment & Plan Note (Signed)
FLP checked today. On Pravachol 40 mg qd.

## 2013-08-19 NOTE — Assessment & Plan Note (Signed)
Stable,no acute change. She has appointment for PFT in 2wks. Albuterol prescribed as needed.

## 2013-08-20 ENCOUNTER — Telehealth: Payer: Self-pay | Admitting: Family Medicine

## 2013-08-20 NOTE — Telephone Encounter (Signed)
Discussed K+ result with patient,continue K+ supplement every other day. Triglyceride elevated,continue Pravachol and add fish oil. Patient verbalized understanding.

## 2013-08-25 ENCOUNTER — Other Ambulatory Visit: Payer: Self-pay | Admitting: Family Medicine

## 2013-08-26 ENCOUNTER — Encounter: Payer: Self-pay | Admitting: Pharmacist

## 2013-08-26 ENCOUNTER — Ambulatory Visit (INDEPENDENT_AMBULATORY_CARE_PROVIDER_SITE_OTHER): Payer: Medicare Other | Admitting: Pharmacist

## 2013-08-26 VITALS — BP 115/77 | HR 86 | Ht 62.0 in | Wt 334.9 lb

## 2013-08-26 DIAGNOSIS — J984 Other disorders of lung: Secondary | ICD-10-CM

## 2013-08-26 DIAGNOSIS — J449 Chronic obstructive pulmonary disease, unspecified: Secondary | ICD-10-CM

## 2013-08-26 NOTE — Patient Instructions (Addendum)
Thank you for coming into today.  We are setting a goal for loosing weight. Want to be <330 in 3 months. Keep setting goals for yourself.  Try adding more water to your diet. You can get some flavor to help increase your water intake.  We want you to eat breakfast. Boil a dozen eggs every Sunday for an easy, quick first meal of the day.   Call us if you have any problems.

## 2013-08-26 NOTE — Progress Notes (Signed)
S:    Patient arrives in good spirits. She presents to the clinic for lung function evaluation. Patient reports breathing has been fine. She reports only one incident of shortness of breath per year.  Dx COPD 2009. Quit smoking that year. Smoked a little less than a pack a day [facility]) for 25 years. Pt. Obese, reports that finances are barrier. Knee and back pain hinders exercise. Reports having to sit while making a sandwich.  O: mMRC score= <2 (mobility decreased d/t disability in back or knees)  See "scanned report" or Documentation Flowsheet (discrete results - PFTs) for  Spirometry results. Patient provided good effort while attempting spirometry.   No second attempt was made.  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  Discussed weight loss strategies and ways she can improve her weight. She set a goal of loosing 5 pounds in 4 months. Then continue goal setting every 4 months. Patient has been experienced SOB in 2009 and dx with COPD. Pt prescribed albuterol, but not needed. Continue use of albuterol when sx appear.  Reviewed results of pulmonary function tests.  Pt verbalized understanding of results and education.  Written pt instructions provided.  F/U Clinic visit in three months.   Total time in face to face counseling 40 minutes.  Patient seen with Valene Bors, PharmD Candidate.

## 2013-08-26 NOTE — Assessment & Plan Note (Signed)
Spirometry evaluation reveals Mild restrictive lung disease.  Likely her pulmonary profile changed post- tobacco cessation in 2009.  Discussed weight loss strategies and ways she can improve her weight. She set a goal of loosing 5 pounds in 4 months. Then continue goal setting every 4 months. Patient has been experienced SOB in 2009 and dx with COPD. Pt prescribed albuterol, but not needed. Continue use of albuterol when sx appear.  Reviewed results of pulmonary function tests.  Pt verbalized understanding of results and education.  Written pt instructions provided.  F/U Clinic visit in three months.   Total time in face to face counseling 40 minutes.  Patient seen with Valene Bors, PharmD Candidate.

## 2013-08-27 ENCOUNTER — Telehealth: Payer: Self-pay | Admitting: Family Medicine

## 2013-08-27 MED ORDER — POTASSIUM CHLORIDE CRYS ER 20 MEQ PO TBCR: 20.0000 meq | EXTENDED_RELEASE_TABLET | ORAL | Status: DC

## 2013-08-27 NOTE — Telephone Encounter (Signed)
Pt is calling for a refill on her potassium. They pharmacy said there were no refills left. JW

## 2013-08-30 NOTE — Progress Notes (Signed)
Patient ID: Kathleen Roy, female   DOB: Oct 18, 1961, 52 y.o.   MRN: [id number] Reviewed: Agree with Dr. Macky Lower documentation and management.

## 2013-08-31 NOTE — Addendum Note (Signed)
Addended by: Kathrin Ruddy on: 08/31/2013 02:44 PM   Modules accepted: Orders

## 2013-09-08 ENCOUNTER — Encounter: Payer: Self-pay | Admitting: Family Medicine

## 2013-09-17 DIAGNOSIS — F410 Panic disorder [episodic paroxysmal anxiety] without agoraphobia: Secondary | ICD-10-CM | POA: Diagnosis not present

## 2013-09-17 DIAGNOSIS — IMO0002 Reserved for concepts with insufficient information to code with codable children: Secondary | ICD-10-CM | POA: Diagnosis not present

## 2013-09-23 ENCOUNTER — Other Ambulatory Visit: Payer: Self-pay | Admitting: Family Medicine

## 2013-09-23 MED ORDER — PRAVASTATIN SODIUM 40 MG PO TABS: 40.0000 mg | ORAL_TABLET | Freq: Every day | ORAL | Status: DC

## 2013-10-21 ENCOUNTER — Ambulatory Visit: Payer: Medicare Other | Admitting: Family Medicine

## 2013-11-02 ENCOUNTER — Ambulatory Visit: Payer: Medicare Other | Admitting: Family Medicine

## 2013-11-04 ENCOUNTER — Ambulatory Visit: Payer: Medicare Other | Admitting: Family Medicine

## 2013-11-15 ENCOUNTER — Other Ambulatory Visit: Payer: Self-pay | Admitting: Family Medicine

## 2013-11-15 MED ORDER — PREGABALIN 150 MG PO CAPS: 150.0000 mg | ORAL_CAPSULE | Freq: Two times a day (BID) | ORAL | Status: DC

## 2013-11-18 NOTE — Telephone Encounter (Signed)
This refill has been called in to her pharmacy, advise patient to call her pharmacy for pick up.

## 2013-11-18 NOTE — Telephone Encounter (Signed)
Pt is aware.  [name],CMA  

## 2013-11-18 NOTE — Telephone Encounter (Signed)
Was this rx placed up front?  [name],CMA

## 2013-11-18 NOTE — Telephone Encounter (Signed)
Pt called about her lyrica refill. Please send it to [facility]

## 2013-11-25 ENCOUNTER — Encounter: Payer: Self-pay | Admitting: Family Medicine

## 2013-11-25 ENCOUNTER — Ambulatory Visit (INDEPENDENT_AMBULATORY_CARE_PROVIDER_SITE_OTHER): Payer: Medicare Other | Admitting: Family Medicine

## 2013-11-25 VITALS — BP 125/88 | HR 86 | Temp 97.6°F | Ht 62.0 in | Wt 340.0 lb

## 2013-11-25 DIAGNOSIS — B373 Candidiasis of vulva and vagina: Secondary | ICD-10-CM | POA: Diagnosis not present

## 2013-11-25 DIAGNOSIS — E785 Hyperlipidemia, unspecified: Secondary | ICD-10-CM

## 2013-11-25 DIAGNOSIS — J984 Other disorders of lung: Secondary | ICD-10-CM | POA: Diagnosis not present

## 2013-11-25 DIAGNOSIS — I10 Essential (primary) hypertension: Secondary | ICD-10-CM

## 2013-11-25 DIAGNOSIS — G43909 Migraine, unspecified, not intractable, without status migrainosus: Secondary | ICD-10-CM | POA: Diagnosis not present

## 2013-11-25 MED ORDER — BUTALBITAL-APAP-CAFFEINE 50-325-40 MG PO TABS: 1.0000 | ORAL_TABLET | Freq: Four times a day (QID) | ORAL | Status: DC | PRN

## 2013-11-25 MED ORDER — METOLAZONE 2.5 MG PO TABS: 2.5000 mg | ORAL_TABLET | ORAL | Status: DC

## 2013-11-25 MED ORDER — FLUCONAZOLE 150 MG PO TABS: 150.0000 mg | ORAL_TABLET | Freq: Once | ORAL | Status: DC

## 2013-11-25 MED ORDER — NYSTATIN 100000 UNIT/GM EX POWD: Freq: Two times a day (BID) | CUTANEOUS | Status: DC

## 2013-11-25 NOTE — Assessment & Plan Note (Signed)
PFT reviewed done by Dr Emilio Aspen. SOB improved a lot. Weight loss would help as well.

## 2013-11-25 NOTE — Assessment & Plan Note (Signed)
BP well controlled, continue current regimen. 

## 2013-11-25 NOTE — Progress Notes (Signed)
Subjective:     Patient ID: Kathleen Roy, female   DOB: 06/18/1961, 52 y.o.   MRN: [id number]  HPI HTN/Hyperlipidemia: Compliant with medication,no complaints,happy with her BP.Here for follow up. Yeast Infection:Patient feels she might have yeast infection. She c/o vulva Itching, with whitish discharge,thick like cottage cheese like.No odor to her discharge. No burning with urination. Symptom started after she completed course of Amoxicillin for dental infection. This has been going on for  1.5 wks. Obesity:Not doing well with diet and exercise,feels like she gained some pounds. Migraine:Used Fioricet in the past,she has not had migraine in many months but usually has Fioricet handy,she is requesting refill of medication. OZH:YQMVH well,here for follow up. She had not used her Albuterol in months.  Current Outpatient Prescriptions on File Prior to Visit  Medication Sig Dispense Refill  . acetaminophen (TYLENOL) 500 MG tablet Take 500 mg by mouth every 6 (six) hours as needed for pain.       Marland Kitchen albuterol (PROVENTIL HFA;VENTOLIN HFA) 108 (90 BASE) MCG/ACT inhaler Inhale 2 puffs into the lungs every 6 (six) hours as needed for wheezing or shortness of breath.  18 g  3  . ARIPiprazole (ABILIFY) 2 MG tablet Take 1 tablet (2 mg total) by mouth daily.  30 tablet  0  . metolazone (ZAROXOLYN) 2.5 MG tablet Take 2.5 mg by mouth once a week. Takes on a weekend day  30 tablet  2  . potassium chloride SA (KLOR-CON M20) 20 MEQ tablet Take 1 tablet (20 mEq total) by mouth every other day.  30 tablet  2  . pravastatin (PRAVACHOL) 40 MG tablet Take 1 tablet (40 mg total) by mouth at bedtime.  90 tablet  1  . pregabalin (LYRICA) 150 MG capsule Take 1 capsule (150 mg total) by mouth 2 (two) times daily.  60 capsule  4  . torsemide (DEMADEX) 20 MG tablet Take 60 mg by mouth daily.      . traMADol (ULTRAM) 50 MG tablet Take 2 tablets (100 mg total) by mouth every 8 (eight) hours as needed for pain.  180 tablet  2  .  trazodone (DESYREL) 300 MG tablet Take 300 mg by mouth at bedtime. [facility])      . venlafaxine (EFFEXOR XR) 150 MG 24 hr capsule Take 300 mg by mouth daily. [facility])       No current facility-administered medications on file prior to visit.   Past Medical History  Diagnosis Date  . Normal echocardiogram 05/30/05    suboptimal study  . Obesity, morbid (more than 100 lbs over ideal weight or BMI > 40)     obese since childhood  . Fibromyalgia   . Chronic pain   . Hyperlipidemia   . Sepsis   . Depression   . Sepsis 2002     Review of Systems  Respiratory: Negative.   Cardiovascular: Negative.   Genitourinary: Positive for vaginal discharge.  All other systems reviewed and are negative.       Objective:   Physical Exam  Nursing note and vitals reviewed. Constitutional: She appears well-developed. No distress.  Cardiovascular: Normal rate, regular rhythm, normal heart sounds and intact distal pulses.   No murmur heard. Pulmonary/Chest: Effort normal and breath sounds normal. No respiratory distress. She has no wheezes.  Abdominal: Soft. Bowel sounds are normal. She exhibits no distension and no mass. There is no tenderness.  Genitourinary:  Declined pelvic and GU exam despite vaginal discharge.  Musculoskeletal:  Normal range of motion. She exhibits no edema.       Assessment:     HTN HLD Vulvovaginal candidiasis Migraine Obesity SOB     Plan:     Check problem list

## 2013-11-25 NOTE — Assessment & Plan Note (Signed)
Refill Fioricet. 

## 2013-11-25 NOTE — Assessment & Plan Note (Signed)
Instruction given on scheduling nutritionist appointment. This might help with her nutritional habit.

## 2013-11-25 NOTE — Patient Instructions (Signed)

## 2013-11-25 NOTE — Assessment & Plan Note (Signed)
Declined GU exam,wet prep not done. Treat empirically with DIflucan. RTC if symptom persist.

## 2013-11-25 NOTE — Assessment & Plan Note (Signed)
Compliant with pravachol. Continue same.

## 2013-11-26 ENCOUNTER — Other Ambulatory Visit: Payer: Self-pay | Admitting: Family Medicine

## 2013-11-26 ENCOUNTER — Telehealth: Payer: Self-pay | Admitting: Family Medicine

## 2013-11-26 MED ORDER — TRAMADOL HCL 50 MG PO TABS: 100.0000 mg | ORAL_TABLET | Freq: Three times a day (TID) | ORAL | Status: DC | PRN

## 2013-11-26 NOTE — Telephone Encounter (Signed)
Pt called and would like a refill of tramadol jw

## 2013-11-26 NOTE — Telephone Encounter (Signed)
Please let patient know Tramadol has been called in to her pharmacy.

## 2013-11-26 NOTE — Telephone Encounter (Signed)
Pt is aware.  [name],CMA  

## 2013-11-30 ENCOUNTER — Telehealth: Payer: Self-pay | Admitting: Family Medicine

## 2013-11-30 NOTE — Telephone Encounter (Signed)
Needs to call in generic for fiorcet (?) medication for migraines Pharmacy: [facility] on [address] Please advise  Also nystop powder-needs bigger bottles.

## 2013-12-01 NOTE — Telephone Encounter (Signed)
Pt called and wanted to know if the power form of fiorcet called in? Also was this the larger bottle? jw

## 2013-12-01 NOTE — Telephone Encounter (Signed)
Refilled to pharmacy. Fioricet tab and Nystatin powder in a larger bottle.

## 2013-12-01 NOTE — Telephone Encounter (Signed)
Refill done to pharmacy. Advise patient to call pharmacy to confirm prescription pick up.

## 2014-01-20 ENCOUNTER — Other Ambulatory Visit: Payer: Self-pay | Admitting: Family Medicine

## 2014-01-20 MED ORDER — TORSEMIDE 20 MG PO TABS: 60.0000 mg | ORAL_TABLET | Freq: Every day | ORAL | Status: DC

## 2014-01-28 ENCOUNTER — Telehealth: Payer: Self-pay | Admitting: Family Medicine

## 2014-01-28 NOTE — Telephone Encounter (Signed)
Pt called because [name] is saying she did a no-show. She was 10 minutes late and would not see her. Now she can not get her medications. She said she has been waiting all week and no one is calling her back. She wants to know if Dr. Laurian Brim can take over that part of her care so that she can just get all her medications from one place. jw

## 2014-02-02 DIAGNOSIS — F331 Major depressive disorder, recurrent, moderate: Secondary | ICD-10-CM | POA: Diagnosis not present

## 2014-02-15 ENCOUNTER — Telehealth: Payer: Self-pay | Admitting: Family Medicine

## 2014-02-15 NOTE — Telephone Encounter (Signed)
Pt is taking tramadol. She is taking 6 per day and it is not helping. Can she add 2 pills per day?

## 2014-02-15 NOTE — Telephone Encounter (Signed)
Maximum dose per day is 300-400mg , she is already at 300mg  hence would not recommend going up on the dose. In addition to her Tramadol may try massaging her joint for now.

## 2014-02-16 NOTE — Telephone Encounter (Signed)
I looked through her record, I do not see where she was taking 8 tab of Tramadol per day, if she want to she can, I had discussed side effect of excess Tramadol with her in the past and she is aware of it, please remind her of this. Also suggest referral to orthopedic for better treatment of her knees, she might benefit from knee surgery.

## 2014-02-16 NOTE — Telephone Encounter (Signed)
Spoke with patient and she would like to know why she can't go back to taking 8 tablets a day.  She said that she used to be on it before and was taken down to 6 tablets.  Her pain is getting worse and not controlled with 6 tablets over the past 2 months.  Would like to know if she can go back to taking 8 tabs per day.  Please advise.  She also has an appt to see you 03-17-14, informed her that she might need to be seen sooner to evaluate pain.  [name],CMA

## 2014-02-16 NOTE — Telephone Encounter (Signed)
Spoke with patient and she is still aware side effects.  Pt is going to up her medication to 8 tabs per day.  States that she is really hoping this increased knee pain is due to the extreme cold we have had recently.  Pt is aware that when she comes to see you in April we will need to rethink the option of her going to see orthopedic.  Kathleen Roy,CMA

## 2014-03-02 ENCOUNTER — Other Ambulatory Visit: Payer: Self-pay | Admitting: *Deleted

## 2014-03-02 MED ORDER — TRAMADOL HCL 50 MG PO TABS: 100.0000 mg | ORAL_TABLET | Freq: Three times a day (TID) | ORAL | Status: DC | PRN

## 2014-03-02 MED ORDER — POTASSIUM CHLORIDE CRYS ER 20 MEQ PO TBCR: 20.0000 meq | EXTENDED_RELEASE_TABLET | ORAL | Status: DC

## 2014-03-03 ENCOUNTER — Other Ambulatory Visit: Payer: Self-pay | Admitting: *Deleted

## 2014-03-04 ENCOUNTER — Other Ambulatory Visit: Payer: Self-pay | Admitting: Family Medicine

## 2014-03-04 ENCOUNTER — Other Ambulatory Visit: Payer: Self-pay | Admitting: *Deleted

## 2014-03-04 ENCOUNTER — Telehealth: Payer: Self-pay | Admitting: Family Medicine

## 2014-03-04 MED ORDER — TRAMADOL HCL 50 MG PO TABS: 100.0000 mg | ORAL_TABLET | Freq: Three times a day (TID) | ORAL | Status: DC | PRN

## 2014-03-04 NOTE — Telephone Encounter (Signed)
Tramadol called in to her pharmacy today.

## 2014-03-04 NOTE — Telephone Encounter (Signed)
Called to inquire about Tramadol rx.  Discussed with provider about uping dosage from 180 in quantity to 240.  Please contact back regarding this change.

## 2014-03-04 NOTE — Telephone Encounter (Signed)
Already refilled

## 2014-03-17 ENCOUNTER — Ambulatory Visit (INDEPENDENT_AMBULATORY_CARE_PROVIDER_SITE_OTHER): Payer: Medicare Other | Admitting: Family Medicine

## 2014-03-17 ENCOUNTER — Encounter: Payer: Self-pay | Admitting: Family Medicine

## 2014-03-17 VITALS — BP 147/83 | HR 80 | Temp 98.4°F | Ht 62.0 in | Wt 350.0 lb

## 2014-03-17 DIAGNOSIS — R413 Other amnesia: Secondary | ICD-10-CM | POA: Diagnosis not present

## 2014-03-17 DIAGNOSIS — G609 Hereditary and idiopathic neuropathy, unspecified: Secondary | ICD-10-CM | POA: Diagnosis not present

## 2014-03-17 DIAGNOSIS — R251 Tremor, unspecified: Secondary | ICD-10-CM

## 2014-03-17 DIAGNOSIS — R5383 Other fatigue: Secondary | ICD-10-CM | POA: Diagnosis not present

## 2014-03-17 DIAGNOSIS — Z79899 Other long term (current) drug therapy: Secondary | ICD-10-CM | POA: Diagnosis not present

## 2014-03-17 DIAGNOSIS — M25569 Pain in unspecified knee: Secondary | ICD-10-CM | POA: Diagnosis not present

## 2014-03-17 DIAGNOSIS — R5381 Other malaise: Secondary | ICD-10-CM

## 2014-03-17 DIAGNOSIS — I10 Essential (primary) hypertension: Secondary | ICD-10-CM

## 2014-03-17 DIAGNOSIS — R259 Unspecified abnormal involuntary movements: Secondary | ICD-10-CM

## 2014-03-17 DIAGNOSIS — G629 Polyneuropathy, unspecified: Secondary | ICD-10-CM

## 2014-03-17 NOTE — Patient Instructions (Addendum)
Ms Kathleen Roy, I am sorry about your memory loss, I will like to do some screening to start with in determining the cause of your memory loss. I will call with test result. Please follow up with me in 4 wks.

## 2014-03-17 NOTE — Progress Notes (Signed)
Subjective:     Patient ID: Kathleen Roy, female   DOB: [birth year], 53 y.o.   MRN: [id number]  HPI Knee pain: She c/o worsening B/L knee pain not improving a lot on her Tramadol, she is requesting increase in her dose. Pain at times is about 10/10 in severity. No recent fall or trauma. Memory:C/O forgetfulness which started more than 2 yrs getting gradually,denies frequent HA in the last few month but does have hx of migraine HA.Father dies of lewi body dementia. Her brother also has memory issues but hers is worse, she has problem with her short term memory. Neuropathic pain: C/O worsening burning pain of her LL more on the right associated with some numbness of her foot. Tremors: C/O shakes of both hands worsening over the last few months. Her shakes is worse with intentional hand movement but not so much at rest. HTN: She is compliant with her medication,here for follow up. 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  Current Outpatient Prescriptions on File Prior to Visit  Medication Sig Dispense Refill  . acetaminophen (TYLENOL) 500 MG tablet Take 500 mg by mouth every 6 (six) hours as needed for pain.       Marland Kitchen albuterol (PROVENTIL HFA;VENTOLIN HFA) 108 (90 BASE) MCG/ACT inhaler Inhale 2 puffs into the lungs every 6 (six) hours as needed for wheezing or shortness of breath.  18 g  3  . ARIPiprazole (ABILIFY) 2 MG tablet Take 1 tablet (2 mg total) by mouth daily.  30 tablet  0  . butalbital-acetaminophen-caffeine (FIORICET, ESGIC) 50-325-40 MG per tablet Take 1 tablet by mouth every 6 (six) hours as needed for headache.  14 tablet  0  . fluconazole (DIFLUCAN) 150 MG tablet Take 1 tablet (150 mg total) by mouth once.  1 tablet  0  . metolazone (ZAROXOLYN) 2.5 MG tablet Take 1 tablet (2.5 mg total) by mouth once a week. Takes on a  weekend day  30 tablet  2  . nystatin (MYCOSTATIN) powder Apply topically 2 (two) times daily.  30 g  1  . potassium chloride SA (KLOR-CON M20) 20 MEQ tablet Take 1 tablet (20 mEq total) by mouth every other day.  30 tablet  2  . pravastatin (PRAVACHOL) 40 MG tablet Take 1 tablet (40 mg total) by mouth at bedtime.  90 tablet  1  . pregabalin (LYRICA) 150 MG capsule Take 1 capsule (150 mg total) by mouth 2 (two) times daily.  60 capsule  4  . torsemide (DEMADEX) 20 MG tablet Take 3 tablets (60 mg total) by mouth daily.  90 tablet  4  . traMADol (ULTRAM) 50 MG tablet Take 2 tablets (100 mg total) by mouth every 8 (eight) hours as needed.  200 tablet  3  . trazodone (DESYREL) 300 MG tablet Take 300 mg by mouth at bedtime. ([place])      . venlafaxine (EFFEXOR XR) 150 MG 24 hr capsule Take 300 mg by mouth daily. [facility])       No current facility-administered medications on file prior to visit.   Past Medical History  Diagnosis Date  . Normal echocardiogram 05/30/05    suboptimal study  . Obesity, morbid (more than 100 lbs over ideal weight or BMI > 40)     obese since childhood  .  Fibromyalgia   . Chronic pain   . Hyperlipidemia   . Sepsis   . Depression   . Sepsis 2002      Review of Systems  Respiratory: Negative.   Cardiovascular: Negative.   Gastrointestinal: Negative.   Musculoskeletal: Positive for arthralgias.       Knee pain  Neurological: Positive for tremors.       Paresthesia,memory loss.  Psychiatric/Behavioral: Negative.  Negative for confusion.  All other systems reviewed and are negative.       Filed Vitals:   03/17/14 1633  BP: 147/83  Pulse: 80  Temp: 98.4 F (36.9 C)  TempSrc: Oral  Height: 5\' 2"  (1.575 m)  Weight: 350 lb (158.759 kg)    Objective:   Physical Exam  Nursing note and vitals reviewed. Constitutional: She is oriented to person, place, and time. No distress.  Morbidly obesed  Cardiovascular: Normal rate, regular  rhythm and normal heart sounds.   No murmur heard. Pulmonary/Chest: Effort normal and breath sounds normal. No respiratory distress. She has no wheezes.  Abdominal: Soft. Bowel sounds are normal. She exhibits no distension and no mass. There is no tenderness.  Neurological: She is alert and oriented to person, place, and time. She displays tremor. No cranial nerve deficit.  Fine tremors of both hands.  Psychiatric: She has a normal mood and affect. Her speech is normal and behavior is normal. Judgment and thought content normal. Cognition and memory are not impaired. She exhibits abnormal recent memory. She exhibits normal remote memory.  Infrequent loss of recent memory       Assessment:     B/L Knee pain : OA  Memory loss Neuropathic pain Hand tremors HTN Morbid obesity    Plan:     Check problem list.

## 2014-03-18 DIAGNOSIS — R413 Other amnesia: Secondary | ICD-10-CM | POA: Insufficient documentation

## 2014-03-18 DIAGNOSIS — R251 Tremor, unspecified: Secondary | ICD-10-CM | POA: Insufficient documentation

## 2014-03-18 LAB — VITAMIN B12: Vitamin B-12: 400 pg/mL (ref 211–911)

## 2014-03-18 LAB — RPR

## 2014-03-18 LAB — TSH: TSH: 1.177 u[IU]/mL (ref 0.350–4.500)

## 2014-03-18 LAB — HIV ANTIBODY (ROUTINE TESTING W REFLEX): HIV: NONREACTIVE

## 2014-03-18 NOTE — Assessment & Plan Note (Signed)
Both hands. Likely medication induced ( Abilify,Trazodone, Effexor). Albuterol can cause tremor as well but she has not used it in a long while.  NB: I called to discuss medications with patient.Patient advised to discuss these medications with her psychiatrist if she can be taken off them. She verbalized understanding and agreed with plan.

## 2014-03-18 NOTE — Assessment & Plan Note (Signed)
Short term memory loss. FH of Lewi body dementia. Plan to start evaluation by obtaining TSH,Vit B12, HIV, RPR. Consider MRI brain if above test are normal.

## 2014-03-18 NOTE — Assessment & Plan Note (Signed)
Currently on Lyrica. We will continue same dose for now. Consider neurology evaluation at next visit. Continue wearing custom footwear for comfort and protection.

## 2014-03-18 NOTE — Assessment & Plan Note (Addendum)
Patient gained 10 lbs since last visit. Diet and exercise counseling was done for her.  I recommended revisiting Bariatric surgery, they can also recommend weight loss plan to her. I will look into getting her to see a bariatric surgeon.

## 2014-03-18 NOTE — Assessment & Plan Note (Addendum)
Knee pain is still an issue. I will obtain xray of her knees and PT referral. I also recommended seeing an orthopedic again to see if they can offer any other treatment regimen. As discussed with her she can take up to a maximum dose of 400mg  per day but preferably 300mg  per day of her Tramadol. Side effect reviewed with her, she is aware of this. I also encouraged weight loss.

## 2014-03-18 NOTE — Assessment & Plan Note (Signed)
BP looks good today. Continue current treatment regimen.

## 2014-03-30 ENCOUNTER — Telehealth: Payer: Self-pay | Admitting: Family Medicine

## 2014-03-30 NOTE — Telephone Encounter (Signed)
Pt called and needs a refill on her Tramadol and to remind the doctor that she was going to increase the qty to 240. jw

## 2014-04-01 ENCOUNTER — Other Ambulatory Visit: Payer: Self-pay | Admitting: Family Medicine

## 2014-04-01 MED ORDER — TRAMADOL HCL 50 MG PO TABS: 100.0000 mg | ORAL_TABLET | Freq: Three times a day (TID) | ORAL | Status: DC | PRN

## 2014-04-01 NOTE — Telephone Encounter (Signed)
Left message on voicemail informing that med had been refilled.

## 2014-04-01 NOTE — Telephone Encounter (Signed)
Tramadol refilled, please let patient know this. 240 tab given.

## 2014-04-04 ENCOUNTER — Other Ambulatory Visit: Payer: Self-pay | Admitting: *Deleted

## 2014-04-05 MED ORDER — PRAVASTATIN SODIUM 40 MG PO TABS: 40.0000 mg | ORAL_TABLET | Freq: Every day | ORAL | Status: DC

## 2014-04-06 ENCOUNTER — Other Ambulatory Visit: Payer: Self-pay | Admitting: *Deleted

## 2014-04-07 MED ORDER — PREGABALIN 150 MG PO CAPS: 150.0000 mg | ORAL_CAPSULE | Freq: Two times a day (BID) | ORAL | Status: DC

## 2014-07-01 ENCOUNTER — Ambulatory Visit (INDEPENDENT_AMBULATORY_CARE_PROVIDER_SITE_OTHER): Payer: Commercial Managed Care - HMO | Admitting: Family Medicine

## 2014-07-01 ENCOUNTER — Encounter: Payer: Self-pay | Admitting: Family Medicine

## 2014-07-01 VITALS — BP 145/70 | HR 103 | Temp 98.1°F | Wt 359.0 lb

## 2014-07-01 DIAGNOSIS — M545 Low back pain, unspecified: Secondary | ICD-10-CM

## 2014-07-01 DIAGNOSIS — R413 Other amnesia: Secondary | ICD-10-CM

## 2014-07-01 DIAGNOSIS — F339 Major depressive disorder, recurrent, unspecified: Secondary | ICD-10-CM

## 2014-07-01 DIAGNOSIS — M25569 Pain in unspecified knee: Secondary | ICD-10-CM

## 2014-07-01 DIAGNOSIS — R251 Tremor, unspecified: Secondary | ICD-10-CM

## 2014-07-01 DIAGNOSIS — R259 Unspecified abnormal involuntary movements: Secondary | ICD-10-CM

## 2014-07-01 DIAGNOSIS — I10 Essential (primary) hypertension: Secondary | ICD-10-CM

## 2014-07-01 MED ORDER — ARIPIPRAZOLE 2 MG PO TABS: 2.0000 mg | ORAL_TABLET | Freq: Every day | ORAL | Status: DC

## 2014-07-01 MED ORDER — VENLAFAXINE HCL ER 150 MG PO CP24: ORAL_CAPSULE | ORAL | Status: DC

## 2014-07-01 NOTE — Assessment & Plan Note (Signed)
She does not seem compliant with recommendation. I referred her to PT, orthopedic and for xray the last visit but she never follow through with them. For now I will have her continue Tramadol as needed for pain. I recommended weight loss which will help improve her pain.

## 2014-07-01 NOTE — Assessment & Plan Note (Signed)
I reviewed her medication. I was concern about taking more than maximum dose of Effexor. She stated this was approved by her psychiatrist at [facility]. I suggested we try Effexor 225mg  daily which is the maximum recommended dose. She will try this for now but prefer to be on 300mg  since it works and she does not have any s/e to it. She will provide a letter from her psychiatrist stating she can take 300 mg dose. I refilled her medications.

## 2014-07-01 NOTE — Assessment & Plan Note (Addendum)
Patient had gained weight since last visit. Nutritionist referral recommended but she declined as always. Diet counseling done. Unfortunate she can't exercise due to knee pain. I again recommended she sees a bariatric doctor or attend weight loss clinic. She will try this. She is requesting Bariatric bed. I will work on obtaining this for her. There is difficulty in rolling over and with positioning with ordinary bed due to her weight. She weighs 359 lbs with a BMI of 65.65. There is constant soreness and pain due to poor positioning in bed. She will benefit from bariatric bed with pull up traction for change in position. Patient can also be described as homebound due to needing support from her brother for her to attend doctors appointment due to risk for fall.

## 2014-07-01 NOTE — Assessment & Plan Note (Signed)
Essential tremor vs parkinsonism vs medication induced. Blood work done during last visit was normal. I will recommend neurology assessment. Referral done today.

## 2014-07-01 NOTE — Assessment & Plan Note (Signed)
Non compliant with recommendation. Stable on Tramadol prn. Weight loss encouraged.

## 2014-07-01 NOTE — Patient Instructions (Signed)

## 2014-07-01 NOTE — Progress Notes (Deleted)
Patient ID: Kathleen Roy, female   DOB: 1961/04/28, 53 y.o.   MRN: [id number] Subjective:

## 2014-07-01 NOTE — Progress Notes (Addendum)
Subjective:     Patient ID: Kathleen Roy, female   DOB: 1961/05/25, 53 y.o.   MRN: [id number]  HPI HTN:On Metolazone 2.5 mg qd and Torsemide 60 mg qd, she is compliant with medication.Here for follow up. Knee/Back pain:She continues to have low back pain and B/L knee pain which is worse on her right knee. She denies any recent fall or injury. Tramadol helps with her pain. Depression:Patient stated she does not want to go back to [facility] for her medication since they don't treat her well, she will like me to take over her refill. She is currently on Ability 2.5mg ,Effexor 150mg   XR 2 tab daily Trazodone 300 mg. She stated she had done well on this regimen. No s/e to medication. Tremors: She continues to have worsening of her hand tremor which is bilateral, at times it is difficult to hold object without dropping it, she stated her daddy had parkinson and lewi body dementia, she is concern she might have it as well. Memory:Merory problem is worsening, she seem to forget things that occurs recently. She denies any HA,no vision changes. No recent head injury. Obesity: Patient stated it seem she is unable to lose weight despite dieting, she stopped eating donut and chocolate drinks. She still drinks soda. She stated her weight has become a problem for her while sleeping at night, she cannot role over comfortably hence always sleeping on one side of her body all through the night which will later become sore in the morning.She has difficulty changing position in bed due to her weight. She is requesting for a bariatric bed.  Current Outpatient Prescriptions on File Prior to Visit  Medication Sig Dispense Refill  . ARIPiprazole (ABILIFY) 2 MG tablet Take 1 tablet (2 mg total) by mouth daily.  30 tablet  0  . metolazone (ZAROXOLYN) 2.5 MG tablet Take 1 tablet (2.5 mg total) by mouth once a week. Takes on a weekend day  30 tablet  2  . nystatin (MYCOSTATIN) powder Apply topically 2 (two) times daily.  30 g  1  .  potassium chloride SA (KLOR-CON M20) 20 MEQ tablet Take 1 tablet (20 mEq total) by mouth every other day.  30 tablet  2  . pravastatin (PRAVACHOL) 40 MG tablet Take 1 tablet (40 mg total) by mouth at bedtime.  90 tablet  1  . pregabalin (LYRICA) 150 MG capsule Take 1 capsule (150 mg total) by mouth 2 (two) times daily.  60 capsule  4  . torsemide (DEMADEX) 20 MG tablet Take 3 tablets (60 mg total) by mouth daily.  90 tablet  4  . traMADol (ULTRAM) 50 MG tablet Take 2 tablets (100 mg total) by mouth every 8 (eight) hours as needed.  240 tablet  3  . trazodone (DESYREL) 300 MG tablet Take 300 mg by mouth at bedtime. ([place])      . venlafaxine (EFFEXOR XR) 150 MG 24 hr capsule Take 300 mg by mouth daily. ([place])      . acetaminophen (TYLENOL) 500 MG tablet Take 500 mg by mouth every 6 (six) hours as needed for pain.       Marland Kitchen albuterol (PROVENTIL HFA;VENTOLIN HFA) 108 (90 BASE) MCG/ACT inhaler Inhale 2 puffs into the lungs every 6 (six) hours as needed for wheezing or shortness of breath.  18 g  3  . butalbital-acetaminophen-caffeine (FIORICET, ESGIC) 50-325-40 MG per tablet Take 1 tablet by mouth every 6 (six) hours as needed for headache.  14 tablet  0  . fluconazole (DIFLUCAN) 150 MG tablet Take 1 tablet (150 mg total) by mouth once.  1 tablet  0   No current facility-administered medications on file prior to visit.   Past Medical History  Diagnosis Date  . Normal echocardiogram 05/30/05    suboptimal study  . Obesity, morbid (more than 100 lbs over ideal weight or BMI > 40)     obese since childhood  . Fibromyalgia   . Chronic pain   . Hyperlipidemia   . Sepsis   . Depression   . Sepsis 2002     Review of Systems  Constitutional: Negative for fatigue.       Weight gain.  Eyes: Negative for visual disturbance.  Respiratory: Negative.   Cardiovascular: Negative.   Gastrointestinal: Negative.   Genitourinary: Negative.   Musculoskeletal: Positive for  arthralgias and back pain.  Neurological: Positive for tremors. Negative for seizures, syncope, numbness and headaches.  Psychiatric/Behavioral:       Depression stable on medication.  All other systems reviewed and are negative.  Filed Vitals:   07/01/14 1145 07/01/14 1212  BP: 161/83 145/70  Pulse: 103   Temp: 98.1 F (36.7 C)   TempSrc: Oral   Weight: 359 lb (162.841 kg)        Filed Vitals:   07/01/14 1145  BP: 161/83  Pulse: 103  Temp: 98.1 F (36.7 C)  TempSrc: Oral  Weight: 359 lb (162.841 kg)    Objective:   Physical Exam  Nursing note and vitals reviewed. Constitutional: She is oriented to person, place, and time. She appears well-developed. No distress.  Morbidly obese.  HENT:  Head: Normocephalic.  Neck: No thyromegaly present.  Cardiovascular: Normal rate, regular rhythm, normal heart sounds and intact distal pulses.   No murmur heard. Pulmonary/Chest: Effort normal and breath sounds normal. No respiratory distress. She has no wheezes. She exhibits no tenderness.  Abdominal: Soft. Bowel sounds are normal. She exhibits no mass. There is no tenderness. There is no guarding.  Musculoskeletal: Normal range of motion.  B/L non-pitting edema.  Neurological: She is alert and oriented to person, place, and time. She displays tremor. No cranial nerve deficit or sensory deficit. She displays no seizure activity.  MMSE score of about 23. + Intention tremor.  Psychiatric: She has a normal mood and affect. Her behavior is normal. Judgment and thought content normal.       Assessment:     HTN: Knee/Back pain: Depression: Tremors:  Memory loss Obesity:    Plan:     Check problem list.

## 2014-07-01 NOTE — Assessment & Plan Note (Signed)
Mild cognitive impairment. Blood work r/o metabolic/ infectious causes. Referral to neurologist for further assessment. As discussed with her she will need MRI brain but I will defer to neurologist. Referral done today.

## 2014-07-01 NOTE — Assessment & Plan Note (Signed)
BP initially elevated. Repeat BP checked by me normal. ?? White coat HTN vs stress of walking around the clinic. Continue current BP regimen.

## 2014-07-04 ENCOUNTER — Telehealth: Payer: Self-pay | Admitting: *Deleted

## 2014-07-04 NOTE — Addendum Note (Signed)
Addended by: Andrena Mews T on: 07/04/2014 08:10 AM   Modules accepted: Orders

## 2014-07-04 NOTE — Telephone Encounter (Signed)
Received fax from [facility] stating that they can not cut capsule in half for Effexor XR 150 mg (Take 1.5 tablet daily to make a total of 225 mg qd. Recommended maximum daily dose is 225 mg).  Please call pharmacy to verify [phone]. Hassell Done, Rosine Beat, RN

## 2014-07-04 NOTE — Addendum Note (Signed)
Addended by: Andrena Mews T on: 07/04/2014 07:15 AM   Modules accepted: Orders

## 2014-07-05 ENCOUNTER — Telehealth: Payer: Self-pay | Admitting: Family Medicine

## 2014-07-05 ENCOUNTER — Encounter: Payer: Self-pay | Admitting: Clinical

## 2014-07-05 MED ORDER — VENLAFAXINE HCL ER 225 MG PO TB24: 1.0000 | ORAL_TABLET | Freq: Every day | ORAL | Status: DC

## 2014-07-05 NOTE — Progress Notes (Signed)
Referral for a hospital bed has been sent to [place]. Hunt Oris, [name], [place]

## 2014-07-05 NOTE — Telephone Encounter (Signed)
I called and spoke with the pharmacist at [facility] to discuss patient's Effexor, the 150 mg does not come in Capsule, hence I suggested she gets the 225mg  Tab and take once a day, pharmacy will change this and call patient to come in and pick up her new prescription.

## 2014-07-05 NOTE — Progress Notes (Signed)
CSW confirmed receipt of order for a hospital bed with [facility] who confirmed it can take 3-9 days to process order.

## 2014-07-06 ENCOUNTER — Telehealth: Payer: Self-pay | Admitting: Family Medicine

## 2014-07-06 NOTE — Telephone Encounter (Signed)
I called to follow up on things we discussed during her last visit. 1. Bariatric bed acquisition is in process and she would be contacted about it soon. 2. I reminded her to pick up Effexor 225 mg tab from her pharmacy, she stated she was called by the pharmacy to pick up medication and she will as soon as possible. 3. I discussed starting bariatric surgery and need for her to register for weight loss classes with [facility]. She stated she had gone through the class before and does not want to go through the process again for now. She stated she will let me know when she decides to star the process again.

## 2014-07-19 ENCOUNTER — Ambulatory Visit: Payer: Commercial Managed Care - HMO | Admitting: Neurology

## 2014-07-29 ENCOUNTER — Telehealth: Payer: Self-pay | Admitting: Family Medicine

## 2014-07-29 ENCOUNTER — Other Ambulatory Visit: Payer: Self-pay | Admitting: *Deleted

## 2014-07-29 NOTE — Telephone Encounter (Signed)
Pt is aware and appt scheduled for 08-09-14. Chasady Longwell,CMA

## 2014-07-29 NOTE — Telephone Encounter (Signed)
Pt called and wanted to know if the doctor could change the dosage of her fluid pills. She is gaining weight and feels like she is about to exploded. She feels like something needs to be done. Please call jw

## 2014-07-29 NOTE — Telephone Encounter (Signed)
Please have patient schedule follow up next week. She may take one extra dose of Torsemide till next appointment. If symptom worsens despite this encourage her to go to the ED.

## 2014-08-03 ENCOUNTER — Ambulatory Visit (INDEPENDENT_AMBULATORY_CARE_PROVIDER_SITE_OTHER): Payer: Commercial Managed Care - HMO | Admitting: Neurology

## 2014-08-03 ENCOUNTER — Encounter: Payer: Self-pay | Admitting: Neurology

## 2014-08-03 ENCOUNTER — Telehealth: Payer: Self-pay | Admitting: Family Medicine

## 2014-08-03 VITALS — BP 102/69 | HR 76 | Ht 62.0 in | Wt 364.4 lb

## 2014-08-03 DIAGNOSIS — R413 Other amnesia: Secondary | ICD-10-CM

## 2014-08-03 DIAGNOSIS — R251 Tremor, unspecified: Secondary | ICD-10-CM

## 2014-08-03 DIAGNOSIS — R259 Unspecified abnormal involuntary movements: Secondary | ICD-10-CM

## 2014-08-03 DIAGNOSIS — G609 Hereditary and idiopathic neuropathy, unspecified: Secondary | ICD-10-CM

## 2014-08-03 DIAGNOSIS — G629 Polyneuropathy, unspecified: Secondary | ICD-10-CM

## 2014-08-03 DIAGNOSIS — H02409 Unspecified ptosis of unspecified eyelid: Secondary | ICD-10-CM

## 2014-08-03 DIAGNOSIS — R600 Localized edema: Secondary | ICD-10-CM

## 2014-08-03 DIAGNOSIS — H02402 Unspecified ptosis of left eyelid: Secondary | ICD-10-CM

## 2014-08-03 NOTE — Telephone Encounter (Signed)
LMOVM for pt to return call .[place], Jessica Dawn  

## 2014-08-03 NOTE — Progress Notes (Signed)
Reason for visit: Memory disturbance, tremor  Kathleen Roy is a 53 y.o. female  History of present illness:  Kathleen Roy is a 53 year old right-handed white female with a history of severe obesity, and significant peripheral edema. The patient indicates that over the last 3 years, she has had a tremor that is affecting both arms equally, and she notes that the tremor affects her ability to feed herself, and she has had a deterioration in her handwriting. The patient denies a headache and neck tremor, and she denies any tremor in the voice. She denies any significant family history of tremor, although her father had Lewy body dementia. The patient is on Abilify that she takes on a regular basis. She does note some tremor at rest as well. She has had some problems with memory over the last 2 years, and she mainly notes short-term memory issues. She does her own finances without difficulty, but she does have some issues misplacing things about the house frequently. She is able to keep up with appointments and with her medications. The patient indicates that she does snore at night, and she will occasionally wake up with an early morning headache that goes away rapidly. She has a peripheral neuropathy with numbness up to the knees bilaterally, but no numbness on the arms. She has some weakness of the legs, right greater than left. She also has had bilateral ptosis, left greater than right. She denies any problems with speech or swallowing. She comes to this office for an evaluation.  Past Medical History  Diagnosis Date  . Normal echocardiogram 05/30/05    suboptimal study  . Obesity, morbid (more than 100 lbs over ideal weight or BMI > 40)     obese since childhood  . Fibromyalgia   . Chronic pain   . Hyperlipidemia   . Sepsis   . Depression   . Sepsis 2002  . Neuropathy   . Osteoarthritis   . Peripheral edema   . Memory disturbance     Past Surgical History  Procedure Laterality Date  .  Total abdominal hysterectomy w/ bilateral salpingoophorectomy  1994  . Cholecystectomy  1995  . Appendectomy  1995  . Abd. hernia surgery 2002  2003    Family History  Problem Relation Age of Onset  . Stroke Mother   . Dementia Father   . Congestive Heart Failure Brother   . Diabetes Brother   . Hypertension Brother     Social history:  reports that she quit smoking about 5 years ago. Her smoking use included Cigarettes. She has a 30 pack-year smoking history. She has never used smokeless tobacco. She reports that she does not drink alcohol or use illicit drugs.  Medications:  Current Outpatient Prescriptions on File Prior to Visit  Medication Sig Dispense Refill  . acetaminophen (TYLENOL) 500 MG tablet Take 500 mg by mouth every 6 (six) hours as needed for pain.       Marland Kitchen albuterol (PROVENTIL HFA;VENTOLIN HFA) 108 (90 BASE) MCG/ACT inhaler Inhale 2 puffs into the lungs every 6 (six) hours as needed for wheezing or shortness of breath.  18 g  3  . ARIPiprazole (ABILIFY) 2 MG tablet Take 1 tablet (2 mg total) by mouth daily.  30 tablet  3  . fluconazole (DIFLUCAN) 150 MG tablet Take 1 tablet (150 mg total) by mouth once.  1 tablet  0  . metolazone (ZAROXOLYN) 2.5 MG tablet Take 1 tablet (2.5 mg total) by mouth once  a week. Takes on a weekend day  30 tablet  2  . nystatin (MYCOSTATIN) powder Apply topically 2 (two) times daily.  30 g  1  . potassium chloride SA (KLOR-CON M20) 20 MEQ tablet Take 1 tablet (20 mEq total) by mouth every other day.  30 tablet  2  . pravastatin (PRAVACHOL) 40 MG tablet Take 1 tablet (40 mg total) by mouth at bedtime.  90 tablet  1  . pregabalin (LYRICA) 150 MG capsule Take 1 capsule (150 mg total) by mouth 2 (two) times daily.  60 capsule  4  . torsemide (DEMADEX) 20 MG tablet Take 3 tablets (60 mg total) by mouth daily.  90 tablet  4  . traMADol (ULTRAM) 50 MG tablet Take 2 tablets (100 mg total) by mouth every 8 (eight) hours as needed.  240 tablet  3  .  trazodone (DESYREL) 300 MG tablet Take 300 mg by mouth at bedtime. ([place])      . Venlafaxine HCl 225 MG TB24 Take 1 tablet (225 mg total) by mouth daily.  90 tablet  2   No current facility-administered medications on file prior to visit.      Allergies  Allergen Reactions  . Aspirin Hives    REACTION: hives Hasn't had in years     ROS:  Out of a complete 14 system review of symptoms, the patient complains only of the following symptoms, and all other reviewed systems are negative.  Weight gain Chest pain, swelling in the legs Ringing in the ears, dizziness Itching Blurred vision Wheezing, snoring Incontinence of bowel, constipation Incontinence of bladder Memory loss, confusion, headache, numbness, weakness, dizziness, tremor Depression, decreased energy, racing thoughts Insomnia, sleepiness  Blood pressure 102/69, pulse 76, height 5\' 2"  (1.575 m), weight 364 lb 6.4 oz (165.291 kg).  Physical Exam  General: The patient is alert and cooperative at the time of the examination. The patient is morbidly obese.  Eyes: Pupils are equal, round, and reactive to light. Discs are flat bilaterally.  Neck: The neck is supple, no carotid bruits are noted.  Respiratory: The respiratory examination is clear.  Cardiovascular: The cardiovascular examination reveals a regular rate and rhythm, no obvious murmurs or rubs are noted.  Skin: Extremities are with 4+ edema bilaterally in the lower extremities.  Neurologic Exam  Mental status: The Mini-Mental status examination done today shows a total score of 24/30.  Cranial nerves: Facial symmetry is not present. The patient has bilateral ptosis, with left greater than right. There is good sensation of the face to pinprick and soft touch bilaterally. The strength of the facial muscles and the muscles to head turning and shoulder shrug are normal bilaterally. Speech is well enunciated, no aphasia or dysarthria is noted.  Extraocular movements are full. Visual fields are full. The tongue is midline, and the patient has symmetric elevation of the soft palate. No obvious hearing deficits are noted.  Motor: The motor testing reveals 5 over 5 strength of all 4 extremities, with exception that there is proximal muscle weakness in the lower extremity bilaterally, right greater than left.Kermit Balo symmetric motor tone is noted throughout.  Sensory: Sensory testing is intact to pinprick, soft touch, vibration sensation, and position sense the upper extremities, with exception that there is some decrease in pinprick sensation on the right arm relative to the left. With the lower extremities, there is decreased pinprick sensation up to the knees bilaterally, with decreased vibration sensation bilaterally, right greater than left. Position  sensation is decreased significantly bilaterally in the feet. No evidence of extinction is noted.  Coordination: Cerebellar testing reveals good finger-nose-finger and heel-to-shin bilaterally. The patient does have a mild intention tremor with arms bilaterally, but the patient has some tremor in the right greater left hand at rest. The patient may have some mild asterixis.  Gait and station: Gait is wide-based, the patient is able to ambulate independently. Tandem gait was not attempted. Romberg is negative. No drift is seen.  Reflexes: Deep tendon reflexes are symmetric, but are depressed bilaterally. Toes are downgoing bilaterally.   Assessment/Plan:  1. Intention tremor  2. Reported memory disturbance  3. Morbid obesity  4. Severe peripheral edema  5. Peripheral neuropathy  6. Gait disorder  The patient is on Abilify which may produce parkinsonian features, and the patient does have some mild resting component to the tremor. She mainly reports an intention tremor, which is most noticeable while feeding herself. The patient reports memory problems, but she also has morbid obesity,  daytime fatigue and drowsiness, and early morning headaches. The patient may have sleep apnea. The patient will be set up for MRI brain evaluation given the decreased sensation on the right side the body relative to the left. The patient will undergo some blood work today, previous blood work has shown normal thyroid studies, and the HIV antibody was negative, RPR was negative, and there was a normal vitamin B12 level. The patient will followup in 3-4 months. If the MRI and blood work evaluation is unremarkable, the patient will be referred for a sleep study. The mild asterixis seen today on clinical examination could be related to the use of Lyrica.  Jill Alexanders MD 08/03/2014 8:22 PM  [facility] [address] [place], [place] [address]  Phone [phone] Fax [phone]

## 2014-08-03 NOTE — Telephone Encounter (Signed)
I will like to assess her before giving her new prescription, the increase made was temporary pending reassessment.

## 2014-08-03 NOTE — Patient Instructions (Signed)

## 2014-08-03 NOTE — Telephone Encounter (Signed)
Pt called because her medication Torsemide was increased but the pharmacy still has not gotten the correct dosage sent to them. They are faxing [place] another request today. Can we get the okay for this so that she can pick it up. jw

## 2014-08-04 ENCOUNTER — Other Ambulatory Visit: Payer: Self-pay | Admitting: *Deleted

## 2014-08-04 MED ORDER — TORSEMIDE 20 MG PO TABS: 60.0000 mg | ORAL_TABLET | Freq: Every day | ORAL | Status: DC

## 2014-08-04 NOTE — Telephone Encounter (Signed)
Patient is out of Torsemide, she is concern her pedal swelling is worsening. I refilled her Torsemide. Looking through her record it does not seem she has recent ECHO, I will have her get one done.  Please call patient to schedule ECHO for worsening leg swelling Jessica/ Jazmin. Thanks.

## 2014-08-05 LAB — AMMONIA: AMMONIA: 156 ug/dL — AB (ref 19–87)

## 2014-08-05 LAB — ACETYLCHOLINE RECEPTOR, BINDING: AChR Binding Ab, Serum: 0.03 nmol/L (ref 0.00–0.24)

## 2014-08-05 NOTE — Telephone Encounter (Signed)
Authorization recived and appt made.  Pt informed but will call to reschedule due to inconvenience of appt. Amoreena Neubert, [name]

## 2014-08-05 NOTE — Telephone Encounter (Signed)
Placed authorization for Silverback.  It is suspended at this time.  Will check back later today. Fleeger, [name]

## 2014-08-09 ENCOUNTER — Encounter: Payer: Self-pay | Admitting: Family Medicine

## 2014-08-09 ENCOUNTER — Ambulatory Visit (INDEPENDENT_AMBULATORY_CARE_PROVIDER_SITE_OTHER): Payer: Commercial Managed Care - HMO | Admitting: Family Medicine

## 2014-08-09 DIAGNOSIS — R5383 Other fatigue: Secondary | ICD-10-CM

## 2014-08-09 DIAGNOSIS — R259 Unspecified abnormal involuntary movements: Secondary | ICD-10-CM

## 2014-08-09 DIAGNOSIS — R609 Edema, unspecified: Secondary | ICD-10-CM

## 2014-08-09 DIAGNOSIS — F339 Major depressive disorder, recurrent, unspecified: Secondary | ICD-10-CM

## 2014-08-09 DIAGNOSIS — R251 Tremor, unspecified: Secondary | ICD-10-CM

## 2014-08-09 DIAGNOSIS — R5382 Chronic fatigue, unspecified: Secondary | ICD-10-CM

## 2014-08-09 DIAGNOSIS — E7220 Disorder of urea cycle metabolism, unspecified: Secondary | ICD-10-CM

## 2014-08-09 DIAGNOSIS — R5381 Other malaise: Secondary | ICD-10-CM

## 2014-08-09 DIAGNOSIS — R413 Other amnesia: Secondary | ICD-10-CM

## 2014-08-09 LAB — AMMONIA: Ammonia: 47 umol/L (ref 11–35)

## 2014-08-09 NOTE — Assessment & Plan Note (Signed)
Stable on medication. Plan to switch to Abilify every other day due to tremors. I referred to psychiatrist for further medication management since she seem to be having s/e with them. She agreed with plan.

## 2014-08-09 NOTE — Assessment & Plan Note (Addendum)
Neurologist work up in progress. Copper also checked at neurologist request for memory issue.

## 2014-08-09 NOTE — Patient Instructions (Signed)
Edema  Edema is an abnormal buildup of fluids. It is more common in your legs and thighs. Painless swelling of the feet and ankles is more likely as a person ages. It also is common in looser skin, like around your eyes.  HOME CARE   · Keep the affected body part above the level of the heart while lying down.  · Do not sit still or stand for a long time.  · Do not put anything right under your knees when you lie down.  · Do not wear tight clothes on your upper legs.  · Exercise your legs to help the puffiness (swelling) go down.  · Wear elastic bandages or support stockings as told by your doctor.  · A low-salt diet may help lessen the puffiness.  · Only take medicine as told by your doctor.  GET HELP IF:  · Treatment is not working.  · You have heart, liver, or kidney disease and notice that your skin looks puffy or shiny.  · You have puffiness in your legs that does not get better when you raise your legs.  · You have sudden weight gain for no reason.  GET HELP RIGHT AWAY IF:   · You have shortness of breath or chest pain.  · You cannot breathe when you lie down.  · You have pain, redness, or warmth in the areas that are puffy.  · You have heart, liver, or kidney disease and get edema all of a sudden.  · You have a fever and your symptoms get worse all of a sudden.  MAKE SURE YOU:   · Understand these instructions.  · Will watch your condition.  · Will get help right away if you are not doing well or get worse.  Document Released: 05/20/2008 Document Revised: 12/07/2013 Document Reviewed: 09/24/2013  ExitCare® Patient Information ©2015 [employer]. This information is not intended to replace advice given to you by your health care provider. Make sure you discuss any questions you have with your health care provider.

## 2014-08-09 NOTE — Assessment & Plan Note (Signed)
Lost few pounds from last visit. She continued to eat processed food and less fresh vegetable. I again recommended nutritionist evaluation. Unable to exercise due to knee pain. I will refer to nutritionist for assessment. She was given nutritionist business card from previous visit.

## 2014-08-09 NOTE — Addendum Note (Signed)
Addended by: [place], Jayanth Szczesniak on: 08/09/2014 04:22 PM   Modules accepted: Orders

## 2014-08-09 NOTE — Progress Notes (Addendum)
Subjective:     Patient ID: Kathleen Roy, female   DOB: 08-03-1961, 53 y.o.   MRN: [id number]  HPI Obesity: Patient continued to gain weight, she is concern this is due to fluid rather than fat, she had cut back on her diet and trying to eat more healthy meal.  Edema:Patient stated she is gaining weight from fluid instead of fat, she had worsened leg swelling few days ago but improved with Torsemide 80 mg qd from 60 mg qd, she is here for follow up. Tremor/Memory loss: She stated she was recently seen by the neurologist who ordered some testing including MRI. She was supposed to get copper checked as well but not done due to poor venous assess, she will like to get it done today. Hyperammonemia; Lab work done at the neurologist office showed elevated NH3, patient denies any change in mental status except for memory issue for which she is receiving evaluation for. No other concern. Major depression: Compliant with medications, she presented with her psychiatrist record from [place] today.Denies any mood change. Fatigue: Also gave hx of chronic fatigue which she attributed to her weight and poor sleep.  Current Outpatient Prescriptions on File Prior to Visit  Medication Sig Dispense Refill  . metolazone (ZAROXOLYN) 2.5 MG tablet Take 1 tablet (2.5 mg total) by mouth once a week. Takes on a weekend day  30 tablet  2  . acetaminophen (TYLENOL) 500 MG tablet Take 500 mg by mouth every 6 (six) hours as needed for pain.       Marland Kitchen albuterol (PROVENTIL HFA;VENTOLIN HFA) 108 (90 BASE) MCG/ACT inhaler Inhale 2 puffs into the lungs every 6 (six) hours as needed for wheezing or shortness of breath.  18 g  3  . ARIPiprazole (ABILIFY) 2 MG tablet Take 1 tablet (2 mg total) by mouth daily.  30 tablet  3  . Ascorbic Acid (VITAMIN C) 1000 MG tablet Take 3,000 mg by mouth daily.      . fluconazole (DIFLUCAN) 150 MG tablet Take 1 tablet (150 mg total) by mouth once.  1 tablet  0  . Multiple Vitamins-Minerals (CENTRUM  SILVER ADULT 50+ PO) Take 1 tablet by mouth daily.      Marland Kitchen nystatin (MYCOSTATIN) powder Apply topically 2 (two) times daily.  30 g  1  . Omega-3 Fatty Acids (FISH OIL) 1000 MG CAPS Take 2 capsules by mouth daily.      . polyethylene glycol (MIRALAX / GLYCOLAX) packet Take 17 g by mouth daily.      . potassium chloride SA (KLOR-CON M20) 20 MEQ tablet Take 1 tablet (20 mEq total) by mouth every other day.  30 tablet  2  . pravastatin (PRAVACHOL) 40 MG tablet Take 1 tablet (40 mg total) by mouth at bedtime.  90 tablet  1  . pregabalin (LYRICA) 150 MG capsule Take 1 capsule (150 mg total) by mouth 2 (two) times daily.  60 capsule  4  . traMADol (ULTRAM) 50 MG tablet Take 2 tablets (100 mg total) by mouth every 8 (eight) hours as needed.  240 tablet  3  . trazodone (DESYREL) 300 MG tablet Take 300 mg by mouth at bedtime. ([place])      . Venlafaxine HCl 225 MG TB24 Take 1 tablet (225 mg total) by mouth daily.  90 tablet  2   No current facility-administered medications on file prior to visit.   Past Medical History  Diagnosis Date  . Normal echocardiogram 05/30/05  suboptimal study  . Obesity, morbid (more than 100 lbs over ideal weight or BMI > 40)     obese since childhood  . Fibromyalgia   . Chronic pain   . Hyperlipidemia   . Sepsis   . Depression   . Sepsis 2002  . Neuropathy   . Osteoarthritis   . Peripheral edema   . Memory disturbance     Review of Systems  Respiratory: Negative.   Cardiovascular: Negative.   Gastrointestinal: Negative.   Genitourinary: Negative.   Musculoskeletal:       Edema of the feet  Neurological: Positive for tremors. Negative for headaches.       Memory loss  All other systems reviewed and are negative.  Filed Vitals:   08/09/14 1116  BP: 134/84  Pulse: 89  Height: 5\' 2"  (1.575 m)  Weight: 361 lb (163.749 kg)       Objective:   Physical Exam  Nursing note and vitals reviewed. Constitutional: She is oriented to person, place,  and time. She appears well-developed. No distress.  Cardiovascular: Normal rate, regular rhythm, normal heart sounds and intact distal pulses.   No murmur heard. Pulmonary/Chest: Effort normal and breath sounds normal. No respiratory distress. She has no wheezes.  Abdominal: Soft. Bowel sounds are normal. She exhibits no distension and no mass. There is no tenderness.  Musculoskeletal: Normal range of motion.  B/L non-pitting LL swelling. No erythema or tenderness. Skin over her shins are dry and scaly.  Neurological: She is alert and oriented to person, place, and time. No cranial nerve deficit.  Psychiatric: She has a normal mood and affect. Her behavior is normal. Thought content normal.       Assessment:     Obesity: Edema: Tremor/memory loss: Hyperammonemia; Chronic fatigue    Plan:     Check problem list.

## 2014-08-09 NOTE — Assessment & Plan Note (Signed)
Seem to improve with Metolazone and Torsemide 80 mg qd. Kidney function test ordered. Plan to adjust torsemide if creatinine is elevated. She continued to eat processed food which contains high amount of sodium. I recommended more fresh fruits and vegetable for her diet. ECHO ordered to r/o CHF as a contributor to her edema. Continue Torsemide and Metolazone for now.

## 2014-08-09 NOTE — Assessment & Plan Note (Signed)
May be due to medications. Patient asymptomatic. Repeat NH3 level. Copper also checked at neurologist request for memory issue. LFT checked as well. Consider lactulose if NH3 elevated.

## 2014-08-09 NOTE — Assessment & Plan Note (Signed)
Pending full work up by neurologist. As discussed with her some of her antidepressant medications might be contributing to this. I recommended Ability every other day to check for improvement. F/U with neurologist for complete work up.

## 2014-08-10 ENCOUNTER — Other Ambulatory Visit (HOSPITAL_COMMUNITY): Payer: Commercial Managed Care - HMO

## 2014-08-10 ENCOUNTER — Telehealth: Payer: Self-pay | Admitting: Family Medicine

## 2014-08-10 DIAGNOSIS — R5382 Chronic fatigue, unspecified: Secondary | ICD-10-CM

## 2014-08-10 DIAGNOSIS — R7981 Abnormal blood-gas level: Secondary | ICD-10-CM

## 2014-08-10 LAB — COMPLETE METABOLIC PANEL WITH GFR
ALBUMIN: 4.2 g/dL (ref 3.5–5.2)
ALT: 10 U/L (ref 0–35)
AST: 14 U/L (ref 0–37)
Alkaline Phosphatase: 75 U/L (ref 39–117)
BILIRUBIN TOTAL: 0.5 mg/dL (ref 0.2–1.2)
BUN: 17 mg/dL (ref 6–23)
CO2: 44 mEq/L — ABNORMAL HIGH (ref 19–32)
Calcium: 10.2 mg/dL (ref 8.4–10.5)
Chloride: 87 mEq/L — ABNORMAL LOW (ref 96–112)
Creat: 0.84 mg/dL (ref 0.50–1.10)
GFR, EST NON AFRICAN AMERICAN: 80 mL/min
GFR, Est African American: >89 mL/min
GLUCOSE: 105 mg/dL — AB (ref 70–99)
POTASSIUM: 2.9 meq/L — AB (ref 3.5–5.3)
Sodium: 141 mEq/L (ref 135–145)
TOTAL PROTEIN: 6.9 g/dL (ref 6.0–8.3)

## 2014-08-10 NOTE — Addendum Note (Signed)
Addended by: Andrena Mews T on: 08/10/2014 09:34 AM   Modules accepted: Orders

## 2014-08-10 NOTE — Telephone Encounter (Addendum)
I called and discussed result with patient. Potassium low. She is normally on Kdur 20 meq/l  Every other day. She had recently increased the dose of her Torsemide to 80 mg qd, this could have contributed to her hypokalemia. I instructed her to take her [place] everyday for 5 days and then return to every other day. Plan to recheck BMET in 2 wks. I also discussed her use of Effexor and Tramadol which can interact causing serotonin syndrome. Patient stated she need to take Tramadol daily for pain. I suggested she return for change in her pain medicine regimen. In the interim may D/C tramadol if having any symptoms. She agreed with plan. CO2 elevated, patient asymptomatic during visit. Likely due to obesity ( Hx of restrictive lung disease with PFT)/compensated respiratory acidosis, vs use of diuretics (metabolic alkalosis). Elevated bicarb has been chronic for her but worsening. R/O sleep apnea with her weight and prior hx of chronic fatigue. Sleep study ordered. Patient informed.

## 2014-08-10 NOTE — Telephone Encounter (Signed)
Pt called because she had discussed with Dr. Gwendlyn Deutscher that she should still take the Torsemide, but the changed it from 3 tablets a day to 4 tablets a day. The only thing is that her current prescription is for 3 tablets a day and she will run out faster and wanted to make sure that Dr. Gwendlyn Deutscher changes it at the pharmacy. jw

## 2014-08-11 LAB — COPPER, SERUM: Copper: 182 ug/dL — ABNORMAL HIGH (ref 70–175)

## 2014-08-11 NOTE — Telephone Encounter (Signed)
New prescription will be sent.

## 2014-08-11 NOTE — Telephone Encounter (Signed)
I called and discussed elevated copper with her, this could be related to the [facility] which she takes daily. This contains about 25-40% of copper. I advised she hold off on daily use of Centrum till next appointment when I will recheck her labs plus other additional labs. So far her Liver function test is normal. She verbalized understanding and agreed with plan.

## 2014-08-17 ENCOUNTER — Ambulatory Visit (HOSPITAL_COMMUNITY)
Admission: RE | Admit: 2014-08-17 | Discharge: 2014-08-17 | Disposition: A | Discharge status: Home / Self Care | Payer: Medicare HMO | Source: Ambulatory Visit | Attending: Family Medicine | Admitting: Family Medicine

## 2014-08-17 DIAGNOSIS — I079 Rheumatic tricuspid valve disease, unspecified: Secondary | ICD-10-CM | POA: Insufficient documentation

## 2014-08-17 DIAGNOSIS — I517 Cardiomegaly: Secondary | ICD-10-CM

## 2014-08-17 DIAGNOSIS — E785 Hyperlipidemia, unspecified: Secondary | ICD-10-CM | POA: Diagnosis not present

## 2014-08-17 DIAGNOSIS — R609 Edema, unspecified: Secondary | ICD-10-CM | POA: Diagnosis not present

## 2014-08-17 DIAGNOSIS — Z87891 Personal history of nicotine dependence: Secondary | ICD-10-CM | POA: Diagnosis not present

## 2014-08-17 DIAGNOSIS — J449 Chronic obstructive pulmonary disease, unspecified: Secondary | ICD-10-CM | POA: Diagnosis not present

## 2014-08-17 DIAGNOSIS — R600 Localized edema: Secondary | ICD-10-CM

## 2014-08-17 DIAGNOSIS — J4489 Other specified chronic obstructive pulmonary disease: Secondary | ICD-10-CM | POA: Insufficient documentation

## 2014-08-17 NOTE — Progress Notes (Signed)
  Echocardiogram 2D Echocardiogram has been performed.  [name] FRANCES 08/17/2014, 4:55 PM

## 2014-08-18 ENCOUNTER — Telehealth: Payer: Self-pay | Admitting: Family Medicine

## 2014-08-18 ENCOUNTER — Other Ambulatory Visit: Payer: Self-pay | Admitting: Family Medicine

## 2014-08-18 MED ORDER — TORSEMIDE 20 MG PO TABS: 80.0000 mg | ORAL_TABLET | Freq: Every day | ORAL | Status: DC

## 2014-08-18 NOTE — Telephone Encounter (Signed)
Please inform patient, her Torsemide has been refilled.

## 2014-08-18 NOTE — Telephone Encounter (Signed)
Refill request for torsemide. Pt states that Dr. Gwendlyn Deutscher increased her dosage from 3x daily to 4x daily but the RX still reflects 3x daily. Pt would like this corrected.

## 2014-08-23 ENCOUNTER — Inpatient Hospital Stay: Admission: RE | Admit: 2014-08-23 | Payer: Medicare Other | Source: Ambulatory Visit

## 2014-08-25 ENCOUNTER — Other Ambulatory Visit: Payer: Self-pay | Admitting: *Deleted

## 2014-08-30 ENCOUNTER — Encounter: Payer: Self-pay | Admitting: Family Medicine

## 2014-09-01 NOTE — Telephone Encounter (Signed)
Patient calls about refill request for Tramadol and Potassium. Pt states she has been waiting for over a week now, and it out of meds. Pls refill asap.

## 2014-09-02 ENCOUNTER — Telehealth: Payer: Self-pay | Admitting: Family Medicine

## 2014-09-02 ENCOUNTER — Other Ambulatory Visit: Payer: Self-pay | Admitting: Family Medicine

## 2014-09-02 MED ORDER — POTASSIUM CHLORIDE CRYS ER 20 MEQ PO TBCR: 20.0000 meq | EXTENDED_RELEASE_TABLET | ORAL | Status: DC

## 2014-09-02 NOTE — Telephone Encounter (Signed)
Pt has an appt coming up next Tues 9/22 with Dr. Gwendlyn Deutscher. Tramadol and Potassium RX refused bc pt needs an appt. Pt states she can wait for Tues for Tramadol but needs her Potassium refilled. Pls advise.

## 2014-09-02 NOTE — Telephone Encounter (Signed)
I will refill potassium

## 2014-09-02 NOTE — Telephone Encounter (Signed)
PT informed

## 2014-09-06 ENCOUNTER — Ambulatory Visit: Payer: Commercial Managed Care - HMO | Admitting: Family Medicine

## 2014-09-06 ENCOUNTER — Inpatient Hospital Stay: Admission: RE | Admit: 2014-09-06 | Payer: Commercial Managed Care - HMO | Source: Ambulatory Visit

## 2014-09-08 ENCOUNTER — Other Ambulatory Visit: Payer: Self-pay | Admitting: *Deleted

## 2014-09-08 MED ORDER — PREGABALIN 150 MG PO CAPS: 150.0000 mg | ORAL_CAPSULE | Freq: Two times a day (BID) | ORAL | Status: DC

## 2014-09-09 ENCOUNTER — Telehealth: Payer: Self-pay | Admitting: Family Medicine

## 2014-09-09 NOTE — Telephone Encounter (Signed)
Nursing please call in Lyrica 60 tablets, refill 3, thanks

## 2014-09-09 NOTE — Telephone Encounter (Signed)
Rx called in as written.

## 2014-09-15 ENCOUNTER — Other Ambulatory Visit: Payer: Commercial Managed Care - HMO

## 2014-09-16 ENCOUNTER — Other Ambulatory Visit: Payer: Commercial Managed Care - HMO

## 2014-09-22 ENCOUNTER — Other Ambulatory Visit: Payer: Self-pay | Admitting: *Deleted

## 2014-09-22 MED ORDER — PRAVASTATIN SODIUM 40 MG PO TABS: 40.0000 mg | ORAL_TABLET | Freq: Every day | ORAL | Status: DC

## 2014-10-03 ENCOUNTER — Ambulatory Visit (HOSPITAL_BASED_OUTPATIENT_CLINIC_OR_DEPARTMENT_OTHER): Payer: Medicare HMO | Attending: Family Medicine | Admitting: Radiology

## 2014-10-03 VITALS — Ht 62.0 in | Wt 360.0 lb

## 2014-10-03 DIAGNOSIS — G4733 Obstructive sleep apnea (adult) (pediatric): Secondary | ICD-10-CM | POA: Insufficient documentation

## 2014-10-03 DIAGNOSIS — Z6841 Body Mass Index (BMI) 40.0 and over, adult: Secondary | ICD-10-CM | POA: Diagnosis not present

## 2014-10-03 DIAGNOSIS — R5382 Chronic fatigue, unspecified: Secondary | ICD-10-CM | POA: Insufficient documentation

## 2014-10-03 DIAGNOSIS — R7981 Abnormal blood-gas level: Secondary | ICD-10-CM | POA: Diagnosis not present

## 2014-10-04 ENCOUNTER — Ambulatory Visit: Payer: Commercial Managed Care - HMO | Admitting: Family Medicine

## 2014-10-07 ENCOUNTER — Telehealth: Payer: Self-pay | Admitting: Family Medicine

## 2014-10-07 ENCOUNTER — Encounter: Payer: Self-pay | Admitting: Family Medicine

## 2014-10-07 ENCOUNTER — Other Ambulatory Visit: Payer: Self-pay | Admitting: Family Medicine

## 2014-10-07 ENCOUNTER — Ambulatory Visit (INDEPENDENT_AMBULATORY_CARE_PROVIDER_SITE_OTHER): Payer: Commercial Managed Care - HMO | Admitting: Family Medicine

## 2014-10-07 VITALS — BP 125/84 | HR 98 | Temp 98.4°F | Ht 62.0 in | Wt 362.0 lb

## 2014-10-07 DIAGNOSIS — E876 Hypokalemia: Secondary | ICD-10-CM

## 2014-10-07 DIAGNOSIS — M25569 Pain in unspecified knee: Secondary | ICD-10-CM

## 2014-10-07 DIAGNOSIS — E875 Hyperkalemia: Secondary | ICD-10-CM

## 2014-10-07 DIAGNOSIS — R251 Tremor, unspecified: Secondary | ICD-10-CM

## 2014-10-07 LAB — BASIC METABOLIC PANEL
BUN: 15 mg/dL (ref 6–23)
CALCIUM: 9.4 mg/dL (ref 8.4–10.5)
CO2: 42 mEq/L — ABNORMAL HIGH (ref 19–32)
Chloride: 88 mEq/L — ABNORMAL LOW (ref 96–112)
Creat: 0.79 mg/dL (ref 0.50–1.10)
Glucose, Bld: 123 mg/dL — ABNORMAL HIGH (ref 70–99)
Potassium: 3 mEq/L — ABNORMAL LOW (ref 3.5–5.3)
Sodium: 142 mEq/L (ref 135–145)

## 2014-10-07 MED ORDER — OXYCODONE-ACETAMINOPHEN 5-325 MG PO TABS: 1.0000 | ORAL_TABLET | Freq: Three times a day (TID) | ORAL | Status: DC | PRN

## 2014-10-07 NOTE — Patient Instructions (Signed)
It was nice seeing you today, I am sorry you still have issues with your knee pain, however due to reaction between Effexor and Tramadol, I will like for you to d/c Tramadol, I will start Percocet instead and see how you tolerate it. I will like to recheck your potassium as well as copper level today. F/Y with neurologist for tremors and memory loss,I will see you back in 2-3 months or sooner if having any concern.

## 2014-10-07 NOTE — Telephone Encounter (Signed)
Will forward to MD to print or write a new order to be faxed. Sevastian Witczak,CMA

## 2014-10-07 NOTE — Assessment & Plan Note (Signed)
Due to severe arthritis and obesity. Off Tramadol for few days, still in pain. Not a knee replacement surgery candidate per previous visit to orthopedic per patient. Since Tramadol and Effexor can worsen or trigger serotonin syndrome, I switched her pain medicine to Percocet. F/U in 2-4 wks for reassessment.

## 2014-10-07 NOTE — Telephone Encounter (Signed)
Kathleen Roy from [facility] calls, received order for bariatric bed for patient. Patient has [name] and [facility] is not in network with them. Orders will need to go to either [facility] or [facility].

## 2014-10-07 NOTE — Assessment & Plan Note (Signed)
Weight loss instruction given. I again recommended nutritionist assessment, this time she agreed with it. I gave her Dr Jenne Campus phone number to call for appointment and I also placed referral order. I wrote prescription for Bariatric bed [facility]. I will keep watching her for improvement pertaining to weight loss.

## 2014-10-07 NOTE — Progress Notes (Signed)
Subjective:     Patient ID: Kathleen Roy, female   DOB: Mar 08, 1961, 53 y.o.   MRN: [id number]  HPI Knee pain: off Tramadol for few days, B/L knee pain persist, here to discuss another treatment regimen. Obesity: She is concerned she can't get her weight off, she cannot get enough exercise due to knee pain, she has not changed her diet either due to financial constraint. She is requesting for bariatric bed. Hypokalemia: Here to follow up with low K+ she took [place] daily for 5 days and now she is back to [place] 20 meq/l every other day. Denies any chest pain or palpitation. Tremor: Still having tremors, she is yet to get her MRI done which was ordered for memory loss and tremor. Her next neurology appointment is in Dec. Also need to repeat her copper level.  Current Outpatient Prescriptions on File Prior to Visit  Medication Sig Dispense Refill  . acetaminophen (TYLENOL) 500 MG tablet Take 500 mg by mouth every 6 (six) hours as needed for pain.       Marland Kitchen albuterol (PROVENTIL HFA;VENTOLIN HFA) 108 (90 BASE) MCG/ACT inhaler Inhale 2 puffs into the lungs every 6 (six) hours as needed for wheezing or shortness of breath.  18 g  3  . ARIPiprazole (ABILIFY) 2 MG tablet Take 1 tablet (2 mg total) by mouth daily.  30 tablet  3  . Ascorbic Acid (VITAMIN C) 1000 MG tablet Take 3,000 mg by mouth daily.      . fluconazole (DIFLUCAN) 150 MG tablet Take 1 tablet (150 mg total) by mouth once.  1 tablet  0  . metolazone (ZAROXOLYN) 2.5 MG tablet Take 1 tablet (2.5 mg total) by mouth once a week. Takes on a weekend day  30 tablet  2  . Multiple Vitamins-Minerals (CENTRUM SILVER ADULT 50+ PO) Take 1 tablet by mouth daily.      Marland Kitchen nystatin (MYCOSTATIN) powder Apply topically 2 (two) times daily.  30 g  1  . Omega-3 Fatty Acids (FISH OIL) 1000 MG CAPS Take 2 capsules by mouth daily.      . polyethylene glycol (MIRALAX / GLYCOLAX) packet Take 17 g by mouth daily.      . potassium chloride SA (KLOR-CON M20) 20 MEQ tablet Take  1 tablet (20 mEq total) by mouth every other day.  30 tablet  2  . pravastatin (PRAVACHOL) 40 MG tablet Take 1 tablet (40 mg total) by mouth at bedtime.  90 tablet  1  . pregabalin (LYRICA) 150 MG capsule Take 1 capsule (150 mg total) by mouth 2 (two) times daily.  60 capsule  2  . torsemide (DEMADEX) 20 MG tablet Take 4 tablets (80 mg total) by mouth daily.  120 tablet  2  . trazodone (DESYREL) 300 MG tablet Take 300 mg by mouth at bedtime. ([place])      . Venlafaxine HCl 225 MG TB24 Take 1 tablet (225 mg total) by mouth daily.  90 tablet  2   No current facility-administered medications on file prior to visit.   Past Medical History  Diagnosis Date  . Normal echocardiogram 05/30/05    suboptimal study  . Obesity, morbid (more than 100 lbs over ideal weight or BMI > 40)     obese since childhood  . Fibromyalgia   . Chronic pain   . Hyperlipidemia   . Sepsis   . Depression   . Sepsis 2002  . Neuropathy   . Osteoarthritis   .  Peripheral edema   . Memory disturbance       Review of Systems  Respiratory: Negative.   Cardiovascular: Negative.   Gastrointestinal: Negative.   Musculoskeletal: Positive for arthralgias.  Neurological: Positive for tremors.  All other systems reviewed and are negative.  Filed Vitals:   10/07/14 1155  BP: 125/84  Pulse: 98  Temp: 98.4 F (36.9 C)  TempSrc: Oral  Height: 5\' 2"  (1.575 m)  Weight: 362 lb (164.202 kg)       Objective:   Physical Exam  Constitutional: She is oriented to person, place, and time.  Obese  Cardiovascular: Normal rate, regular rhythm and normal heart sounds.   No murmur heard. Pulmonary/Chest: Effort normal and breath sounds normal. No respiratory distress. She exhibits no tenderness.  Abdominal: Soft. Bowel sounds are normal. There is no tenderness.  Musculoskeletal:  Trace edema  Neurological: She is alert and oriented to person, place, and time. No cranial nerve deficit.  Mild resting tremor.   Psychiatric: She has a normal mood and affect.       Assessment:     Knee pain: Obesity: Hypokalemia: Tremor:    Plan:     Check problem list.

## 2014-10-07 NOTE — Assessment & Plan Note (Signed)
BMEt done to check K+ level today

## 2014-10-07 NOTE — Telephone Encounter (Signed)
I have referred to [facility] to help with this, if it does not work, I will right another prescription to [facility].

## 2014-10-07 NOTE — Assessment & Plan Note (Signed)
No acute change. F/U with MRI brain as scheduled. F/U with neurologist as planned. Recheck copper level today. I will call her with result.

## 2014-10-10 ENCOUNTER — Telehealth: Payer: Self-pay | Admitting: Family Medicine

## 2014-10-10 LAB — COPPER, SERUM: COPPER: 167 ug/dL (ref 70–175)

## 2014-10-10 MED ORDER — POTASSIUM CHLORIDE CRYS ER 20 MEQ PO TBCR: EXTENDED_RELEASE_TABLET | ORAL | Status: DC

## 2014-10-10 NOTE — Telephone Encounter (Signed)
I called and discussed lab result with patient, her potassium level is still low, she completed Kdur 20 meq qd for 5 days and then changed to every other day. For now I recommended taking Kdur 20 meq/l dailt from M-Fri and none on the weekend, I will see her back in 4 wks for reassessment. Glucose is slightly elevated, we will get A1C during her next visit. CO2 still slightly elevated, she is yet to start CPAP since sleep study indicated she will need one. I am hoping this will help improve her CO2 level.

## 2014-10-13 ENCOUNTER — Ambulatory Visit (HOSPITAL_COMMUNITY): Payer: Commercial Managed Care - HMO | Admitting: Psychiatry

## 2014-10-15 DIAGNOSIS — R7981 Abnormal blood-gas level: Secondary | ICD-10-CM

## 2014-10-15 DIAGNOSIS — G4733 Obstructive sleep apnea (adult) (pediatric): Secondary | ICD-10-CM

## 2014-10-15 DIAGNOSIS — R5382 Chronic fatigue, unspecified: Secondary | ICD-10-CM

## 2014-10-15 NOTE — Sleep Study (Addendum)
   NAME: Kathleen Roy DATE OF BIRTH:  Apr 18, 1961 MEDICAL RECORD NUMBER [id number]  LOCATION: [facility]  PHYSICIAN: YOUNG,CLINTON D  DATE OF STUDY: 10/03/2014  SLEEP STUDY TYPE: Nocturnal Polysomnogram               REFERRING PHYSICIAN: Andrena Mews, MD  INDICATION FOR STUDY: Insomnia with sleep apnea  EPWORTH SLEEPINESS SCORE:   12/24 HEIGHT: 5\' 2"  (157.5 cm)  WEIGHT: 360 lb (163.295 kg)    Body mass index is 65.83 kg/(m^2).  NECK SIZE: 18 in.  MEDICATIONS: Charted for review  SLEEP ARCHITECTURE: Split study protocol. During the diagnostic phase, total sleep time 134.5 minutes with sleep efficiency 86.8%. Stage I was 4.5%, stage II 91.1%, stage III 3.3%, REM 1.1% of total sleep time. Sleep latency 5.5 minutes, REM latency 146 minutes, awake after sleep onset 15.5 minutes, arousal index 4.5, bedtime medication: Tramadol, Tylenol, fish oil, vitamin C, trazodone, pravastatin  RESPIRATORY DATA: Apnea hypopnea index (AHI) 18.3 per hour. 41 total events scored including 14 obstructive apneas, 1 mixed apnea, 26 hypopneas. Events were seen in all positions, especially nonsupine. CPAP was titrated to 13 CWP with residual AHI 60 per hour reflecting mostly central apneas. She was then changed to bilevel and titrated to a final inspiratory pressure 21 and expiratory pressure 14 with residual AHI 5.1 per hour. She wore a medium fullface mask.  OXYGEN DATA: Moderate snoring before C Pap with oxygen desaturation to a nadir of 73% on room air. Because of persistent desaturation the technician added oxygen at 1 L/m per protocol at 11:43 PM. This was gradually increased to 3 L/m and then tapered back to 1 L/m at 4:12 AM. Despite supplemental oxygen and CPAP/BiPAP, she continued to have occasional desaturation into the 60% range although snoring was prevented.  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 Moderate snoring with oxygen desaturation to a nadir of 73% on room air. 2) CPAP titration to 13 CWP left residual central apneas and an AHI of 60 per hour. She was then changed to bilevel PAP and titrated to inspiratory pressure of 21 and expiratory pressure 14 CWP, AHI 5.1 per hour. 3) Because of persistent oxygen desaturation on room air, supplemental oxygen was provided per protocol. Persistent desaturation was noted with supplemental oxygen from 1-3 L/m despite CPAP/BiPAP. Assessment for supplemental home oxygen during sleep is recommended, in addition to BIPAP. 4) She wore a medium [employer] Simplus fullface mask with heated humidifier  Deneise Lever Diplomate, American Board of Sleep Medicine  ELECTRONICALLY SIGNED ON:  10/15/2014, 11:11 AM [place] PH: [phone]   FX: [phone] [place]

## 2014-10-16 DIAGNOSIS — G4730 Sleep apnea, unspecified: Secondary | ICD-10-CM

## 2014-10-16 HISTORY — DX: Sleep apnea, unspecified: G47.30

## 2014-10-18 ENCOUNTER — Other Ambulatory Visit: Payer: Self-pay | Admitting: Family Medicine

## 2014-10-18 DIAGNOSIS — G4730 Sleep apnea, unspecified: Secondary | ICD-10-CM

## 2014-10-18 NOTE — Telephone Encounter (Signed)
I called to discuss referral to Dr Annamaria Boots for CPAP titration and management, she agreed hence referral done today.    I just read the study. I have never seen the patient. You ordered the study and are the one to make therapeutic decisions. We would be happy to see her at [facility] Sleep Medicine consultation to manage her sleep problems on your referral if you would like.I just read the study. I have never seen the patient. You ordered the study and are the one to make therapeutic decisions. We would be happy to see her at [facility] Sleep Medicine consultation to manage her sleep problems on your referral if you would like.  Deneise Lever, MD (Physician)

## 2014-10-19 ENCOUNTER — Inpatient Hospital Stay: Admission: RE | Admit: 2014-10-19 | Payer: Commercial Managed Care - HMO | Source: Ambulatory Visit

## 2014-10-20 ENCOUNTER — Telehealth: Payer: Self-pay | Admitting: *Deleted

## 2014-10-20 NOTE — Telephone Encounter (Signed)
Pt informed. Kathleen Roy  

## 2014-10-20 NOTE — Telephone Encounter (Signed)
Received call from Education officer, [employer].  Pt is requesting   1. Bariatric bed 2. Trapeze bar for bariatric bed 3. Side rails 4. Bariatric bedside commode 5. Reclining help chair  Pt is aware that her insurance may not pay for all/any of this, but she would like rx sent to [facility] if MD is agreeable. Fleeger, [name]

## 2014-10-20 NOTE — Telephone Encounter (Signed)
I did sent the script about 1 wk ago and [facility] responded they are working on it, is there a way we can check with [facility] to ensure they had initial scripts?

## 2014-10-25 ENCOUNTER — Other Ambulatory Visit: Payer: Self-pay | Admitting: Family Medicine

## 2014-10-25 MED ORDER — OXYCODONE-ACETAMINOPHEN 5-325 MG PO TABS: 1.0000 | ORAL_TABLET | Freq: Four times a day (QID) | ORAL | Status: DC | PRN

## 2014-10-25 NOTE — Telephone Encounter (Signed)
Patient should not be out of medication so quickly please check on this and let her know, last filled Oct 28th. Not even 2 wks yet.

## 2014-10-25 NOTE — Telephone Encounter (Signed)
Spoke with patient and informed her of this.  She states that she only has enough left for 5 days.  She is taking 4 tabs a day every day.  I advised her that this medication is only for when the pain is severe.  She said "I live in terrible pain everyday of my life".  Offered to send message back to MD. Johnney Ou

## 2014-10-25 NOTE — Telephone Encounter (Signed)
Patient needs prescription for Oxycodone (4 tablets per day) for a month

## 2014-10-25 NOTE — Telephone Encounter (Signed)
Pt is aware of this.  Advised her to keep her appt in a couple of weeks.  She plans to and brother will pick up rx tomorrow. [name],CMA

## 2014-10-25 NOTE — Telephone Encounter (Signed)
I will refill enough to last for 1 month today, she may come pick up script.

## 2014-10-29 ENCOUNTER — Inpatient Hospital Stay: Admission: RE | Admit: 2014-10-29 | Payer: Commercial Managed Care - HMO | Source: Ambulatory Visit

## 2014-11-03 ENCOUNTER — Telehealth: Payer: Self-pay | Admitting: Family Medicine

## 2014-11-03 DIAGNOSIS — R5383 Other fatigue: Secondary | ICD-10-CM | POA: Insufficient documentation

## 2014-11-03 NOTE — Telephone Encounter (Signed)
I placed the order in the faxed document and got a comfirmation from [place] that they got it, what do they need again?

## 2014-11-03 NOTE — Telephone Encounter (Signed)
They needed your NPI number as well as the sleep study results.  I faxed them anyway but was unable to find the original fax.  Catcher Dehoyos,CMA

## 2014-11-03 NOTE — Telephone Encounter (Signed)
Spoke with Carlyon Shadow at [facility].  The only thing missing from the RX was Dr. Macario Golds NPI number.  I will have to write this on rx and then refax it to them.  Also they needed the results from patient's sleep study. [name],CMA

## 2014-11-03 NOTE — Assessment & Plan Note (Addendum)
Chronic, recurrent. This might be related to obesity or poor sleep. R/O sleep apnea due to her weight and chronic elevation of CO2. Cmet checked today, if CO2 remains elevated will consider ordering sleep study.

## 2014-11-03 NOTE — Telephone Encounter (Signed)
Looked in cabinet for previous faxed documents and was unable to find the original fax for this order.  Will check with MD to see if she remembers putting it in the faxed pile.  I faxed over results and put on face sheet NPI# and patients case # [id number] to [phone].  Izick Gasbarro,CMA

## 2014-11-03 NOTE — Telephone Encounter (Signed)
Rep from [facility] called and needs some clarification on the order for patient. It is a by-pass machine and the settings that were sent with the order. Please call [phone] to talk with a rep. [email or web address]

## 2014-11-03 NOTE — Telephone Encounter (Signed)
Ok thanks a lot, since you faxed the NPI they can put it on the original fax. Thanks for your help. Let me know if there is anything else I need to do.

## 2014-11-08 ENCOUNTER — Emergency Department (HOSPITAL_COMMUNITY): Payer: Commercial Managed Care - HMO

## 2014-11-08 ENCOUNTER — Encounter: Payer: Self-pay | Admitting: Family Medicine

## 2014-11-08 ENCOUNTER — Other Ambulatory Visit: Payer: Self-pay

## 2014-11-08 ENCOUNTER — Ambulatory Visit (INDEPENDENT_AMBULATORY_CARE_PROVIDER_SITE_OTHER): Payer: Commercial Managed Care - HMO | Admitting: Family Medicine

## 2014-11-08 ENCOUNTER — Observation Stay (HOSPITAL_COMMUNITY)
Admission: EM | Admit: 2014-11-08 | Discharge: 2014-11-09 | Disposition: A | Discharge status: Home / Self Care | Payer: Commercial Managed Care - HMO | Attending: Family Medicine | Admitting: Family Medicine

## 2014-11-08 ENCOUNTER — Encounter (HOSPITAL_COMMUNITY): Payer: Self-pay | Admitting: General Practice

## 2014-11-08 ENCOUNTER — Encounter (HOSPITAL_COMMUNITY): Payer: Self-pay

## 2014-11-08 ENCOUNTER — Telehealth: Payer: Self-pay | Admitting: *Deleted

## 2014-11-08 VITALS — BP 152/80 | HR 98 | Temp 97.7°F | Ht 62.0 in | Wt 364.2 lb

## 2014-11-08 DIAGNOSIS — Z87891 Personal history of nicotine dependence: Secondary | ICD-10-CM | POA: Diagnosis not present

## 2014-11-08 DIAGNOSIS — E662 Morbid (severe) obesity with alveolar hypoventilation: Secondary | ICD-10-CM | POA: Diagnosis not present

## 2014-11-08 DIAGNOSIS — R739 Hyperglycemia, unspecified: Secondary | ICD-10-CM | POA: Diagnosis not present

## 2014-11-08 DIAGNOSIS — Z9981 Dependence on supplemental oxygen: Secondary | ICD-10-CM | POA: Insufficient documentation

## 2014-11-08 DIAGNOSIS — G629 Polyneuropathy, unspecified: Secondary | ICD-10-CM | POA: Insufficient documentation

## 2014-11-08 DIAGNOSIS — E785 Hyperlipidemia, unspecified: Secondary | ICD-10-CM | POA: Diagnosis not present

## 2014-11-08 DIAGNOSIS — I10 Essential (primary) hypertension: Secondary | ICD-10-CM | POA: Diagnosis not present

## 2014-11-08 DIAGNOSIS — G4700 Insomnia, unspecified: Secondary | ICD-10-CM | POA: Diagnosis not present

## 2014-11-08 DIAGNOSIS — G4733 Obstructive sleep apnea (adult) (pediatric): Secondary | ICD-10-CM | POA: Insufficient documentation

## 2014-11-08 DIAGNOSIS — Z6841 Body Mass Index (BMI) 40.0 and over, adult: Secondary | ICD-10-CM | POA: Insufficient documentation

## 2014-11-08 DIAGNOSIS — J449 Chronic obstructive pulmonary disease, unspecified: Secondary | ICD-10-CM | POA: Insufficient documentation

## 2014-11-08 DIAGNOSIS — R0902 Hypoxemia: Principal | ICD-10-CM | POA: Diagnosis present

## 2014-11-08 DIAGNOSIS — Z886 Allergy status to analgesic agent status: Secondary | ICD-10-CM | POA: Insufficient documentation

## 2014-11-08 DIAGNOSIS — R609 Edema, unspecified: Secondary | ICD-10-CM | POA: Diagnosis not present

## 2014-11-08 DIAGNOSIS — F339 Major depressive disorder, recurrent, unspecified: Secondary | ICD-10-CM | POA: Diagnosis not present

## 2014-11-08 DIAGNOSIS — I452 Bifascicular block: Secondary | ICD-10-CM | POA: Diagnosis not present

## 2014-11-08 DIAGNOSIS — M797 Fibromyalgia: Secondary | ICD-10-CM | POA: Insufficient documentation

## 2014-11-08 DIAGNOSIS — G4730 Sleep apnea, unspecified: Secondary | ICD-10-CM

## 2014-11-08 DIAGNOSIS — M170 Bilateral primary osteoarthritis of knee: Secondary | ICD-10-CM | POA: Diagnosis not present

## 2014-11-08 DIAGNOSIS — E872 Acidosis: Secondary | ICD-10-CM | POA: Insufficient documentation

## 2014-11-08 DIAGNOSIS — E876 Hypokalemia: Secondary | ICD-10-CM

## 2014-11-08 DIAGNOSIS — Z741 Need for assistance with personal care: Secondary | ICD-10-CM

## 2014-11-08 DIAGNOSIS — J9611 Chronic respiratory failure with hypoxia: Secondary | ICD-10-CM

## 2014-11-08 HISTORY — DX: Migraine, unspecified, not intractable, without status migrainosus: G43.909

## 2014-11-08 HISTORY — DX: Headache: R51

## 2014-11-08 HISTORY — DX: Low back pain: M54.5

## 2014-11-08 HISTORY — DX: Low back pain, unspecified: M54.50

## 2014-11-08 HISTORY — DX: Headache, unspecified: R51.9

## 2014-11-08 HISTORY — DX: Unspecified osteoarthritis, unspecified site: M19.90

## 2014-11-08 HISTORY — DX: Sleep apnea, unspecified: G47.30

## 2014-11-08 HISTORY — DX: Other chronic pain: G89.29

## 2014-11-08 LAB — CBC WITH DIFFERENTIAL/PLATELET
BASOS ABS: 0 10*3/uL (ref 0.0–0.1)
BASOS PCT: 0 % (ref 0–1)
EOS ABS: 0 10*3/uL (ref 0.0–0.7)
EOS PCT: 0 % (ref 0–5)
HCT: 50.3 % — ABNORMAL HIGH (ref 36.0–46.0)
Hemoglobin: 15.6 g/dL — ABNORMAL HIGH (ref 12.0–15.0)
Lymphocytes Relative: 19 % (ref 12–46)
Lymphs Abs: 1.8 10*3/uL (ref 0.7–4.0)
MCH: 31.6 pg (ref 26.0–34.0)
MCHC: 31 g/dL (ref 30.0–36.0)
MCV: 102 fL — AB (ref 78.0–100.0)
Monocytes Absolute: 0.5 10*3/uL (ref 0.1–1.0)
Monocytes Relative: 5 % (ref 3–12)
NEUTROS PCT: 76 % (ref 43–77)
Neutro Abs: 7.1 10*3/uL (ref 1.7–7.7)
Platelets: 165 10*3/uL (ref 150–400)
RBC: 4.93 MIL/uL (ref 3.87–5.11)
RDW: 16.5 % — AB (ref 11.5–15.5)
WBC: 9.3 10*3/uL (ref 4.0–10.5)

## 2014-11-08 LAB — TROPONIN I

## 2014-11-08 LAB — BASIC METABOLIC PANEL
Anion gap: 12 (ref 5–15)
BUN: 21 mg/dL (ref 6–23)
BUN: 21 mg/dL (ref 6–23)
CALCIUM: 10.1 mg/dL (ref 8.4–10.5)
CHLORIDE: 88 meq/L — AB (ref 96–112)
CO2: 42 meq/L — AB (ref 19–32)
CO2: 43 mEq/L (ref 19–32)
Calcium: 9.9 mg/dL (ref 8.4–10.5)
Chloride: 89 mEq/L — ABNORMAL LOW (ref 96–112)
Creat: 0.82 mg/dL (ref 0.50–1.10)
Creatinine, Ser: 0.76 mg/dL (ref 0.50–1.10)
GFR calc Af Amer: >90 mL/min (ref >90)
GLUCOSE: 116 mg/dL — AB (ref 70–99)
Glucose, Bld: 162 mg/dL — ABNORMAL HIGH (ref 70–99)
Potassium: 2.8 mEq/L — ABNORMAL LOW (ref 3.5–5.3)
Potassium: 2.7 mEq/L — CL (ref 3.7–5.3)
SODIUM: 146 meq/L — AB (ref 135–145)
Sodium: 144 mEq/L (ref 137–147)

## 2014-11-08 LAB — I-STAT ARTERIAL BLOOD GAS, ED
ACID-BASE EXCESS: 23 mmol/L — AB (ref 0.0–2.0)
BICARBONATE: 53.1 meq/L — AB (ref 20.0–24.0)
O2 Saturation: 96 %
PH ART: 7.447 (ref 7.350–7.450)
TCO2: >50 mmol/L (ref 0–100)
pCO2 arterial: 76.9 mmHg (ref 35.0–45.0)
pO2, Arterial: 82 mmHg (ref 80.0–100.0)

## 2014-11-08 LAB — POCT GLYCOSYLATED HEMOGLOBIN (HGB A1C): Hemoglobin A1C: 6.3

## 2014-11-08 MED ORDER — VENLAFAXINE HCL ER 225 MG PO TB24: 1.0000 | ORAL_TABLET | Freq: Every day | ORAL | Status: DC

## 2014-11-08 MED ORDER — OXYCODONE-ACETAMINOPHEN 5-325 MG PO TABS
1.0000 | ORAL_TABLET | Freq: Four times a day (QID) | ORAL | Status: DC | PRN
  Administered 2014-11-08: 2 via ORAL
  Filled 2014-11-08: qty 2

## 2014-11-08 MED ORDER — ALBUTEROL SULFATE (2.5 MG/3ML) 0.083% IN NEBU: 2.5000 mg | INHALATION_SOLUTION | Freq: Four times a day (QID) | RESPIRATORY_TRACT | Status: DC | PRN

## 2014-11-08 MED ORDER — SODIUM CHLORIDE 0.9 % IJ SOLN: 3.0000 mL | INTRAMUSCULAR | Status: DC | PRN

## 2014-11-08 MED ORDER — PRAVASTATIN SODIUM 40 MG PO TABS
40.0000 mg | ORAL_TABLET | Freq: Every day | ORAL | Status: DC
  Administered 2014-11-08: 40 mg via ORAL
  Filled 2014-11-08 (×2): qty 1

## 2014-11-08 MED ORDER — POTASSIUM CHLORIDE CRYS ER 20 MEQ PO TBCR
40.0000 meq | EXTENDED_RELEASE_TABLET | Freq: Once | ORAL | Status: AC
  Administered 2014-11-08: 40 meq via ORAL
  Filled 2014-11-08: qty 2

## 2014-11-08 MED ORDER — OXYCODONE-ACETAMINOPHEN 5-325 MG PO TABS: 1.0000 | ORAL_TABLET | Freq: Four times a day (QID) | ORAL | Status: DC | PRN

## 2014-11-08 MED ORDER — PREGABALIN 75 MG PO CAPS
150.0000 mg | ORAL_CAPSULE | Freq: Two times a day (BID) | ORAL | Status: DC
  Administered 2014-11-08 – 2014-11-09 (×2): 150 mg via ORAL
  Filled 2014-11-08 (×2): qty 2

## 2014-11-08 MED ORDER — SODIUM CHLORIDE 0.9 % IV SOLN: 250.0000 mL | INTRAVENOUS | Status: DC | PRN

## 2014-11-08 MED ORDER — POTASSIUM CHLORIDE 10 MEQ/100ML IV SOLN
10.0000 meq | INTRAVENOUS | Status: AC
  Administered 2014-11-08 – 2014-11-09 (×6): 10 meq via INTRAVENOUS
  Filled 2014-11-08 (×6): qty 100

## 2014-11-08 MED ORDER — HEPARIN SODIUM (PORCINE) 5000 UNIT/ML IJ SOLN
5000.0000 [IU] | Freq: Three times a day (TID) | INTRAMUSCULAR | Status: DC
  Administered 2014-11-08 – 2014-11-09 (×2): 5000 [IU] via SUBCUTANEOUS
  Filled 2014-11-08 (×5): qty 1

## 2014-11-08 MED ORDER — POLYETHYLENE GLYCOL 3350 17 G PO PACK
17.0000 g | PACK | Freq: Every day | ORAL | Status: DC
  Administered 2014-11-09: 17 g via ORAL
  Filled 2014-11-08: qty 1

## 2014-11-08 MED ORDER — SODIUM CHLORIDE 0.9 % IJ SOLN
3.0000 mL | Freq: Two times a day (BID) | INTRAMUSCULAR | Status: DC
  Administered 2014-11-09: 3 mL via INTRAVENOUS

## 2014-11-08 MED ORDER — TRAZODONE HCL 150 MG PO TABS
300.0000 mg | ORAL_TABLET | Freq: Every day | ORAL | Status: DC
  Administered 2014-11-08: 300 mg via ORAL
  Filled 2014-11-08 (×2): qty 2

## 2014-11-08 MED ORDER — NYSTATIN 100000 UNIT/GM EX POWD
Freq: Two times a day (BID) | CUTANEOUS | Status: DC
  Filled 2014-11-08: qty 60

## 2014-11-08 MED ORDER — TORSEMIDE 20 MG PO TABS
80.0000 mg | ORAL_TABLET | Freq: Every day | ORAL | Status: DC
  Administered 2014-11-09: 80 mg via ORAL
  Filled 2014-11-08: qty 4

## 2014-11-08 MED ORDER — SODIUM CHLORIDE 0.9 % IV SOLN
INTRAVENOUS | Status: AC
  Administered 2014-11-08: 22:00:00 via INTRAVENOUS

## 2014-11-08 MED ORDER — ACETAMINOPHEN 325 MG PO TABS: 650.0000 mg | ORAL_TABLET | Freq: Four times a day (QID) | ORAL | Status: DC | PRN

## 2014-11-08 MED ORDER — ACETAMINOPHEN 650 MG RE SUPP: 650.0000 mg | Freq: Four times a day (QID) | RECTAL | Status: DC | PRN

## 2014-11-08 MED ORDER — SODIUM CHLORIDE 0.9 % IJ SOLN: 3.0000 mL | Freq: Two times a day (BID) | INTRAMUSCULAR | Status: DC

## 2014-11-08 MED ORDER — ARIPIPRAZOLE 2 MG PO TABS: 2.0000 mg | ORAL_TABLET | ORAL | Status: DC

## 2014-11-08 MED ORDER — VENLAFAXINE HCL ER 75 MG PO CP24
225.0000 mg | ORAL_CAPSULE | Freq: Every day | ORAL | Status: DC
  Administered 2014-11-09: 225 mg via ORAL
  Filled 2014-11-08 (×2): qty 1

## 2014-11-08 NOTE — Telephone Encounter (Signed)
Received call from [facility] lab with critical high CO2--42.  Will inform Dr. Gwendlyn Deutscher of results.

## 2014-11-08 NOTE — Assessment & Plan Note (Signed)
Check A1C. Test ordered.

## 2014-11-08 NOTE — H&P (Signed)
[facility] Admission History and Physical Service Pager: [phone]  Patient name: [name] Medical record number: [id number] Date of birth: 10-14-1961 Age: 53 y.o. Gender: female  Primary Care Provider: Andrena Mews, MD Consultants: None Code Status: Full per discussion on admission   Chief Complaint: Hypoxia in clinic  Assessment and Plan: Kathleen Roy is a 53 y.o. female presenting with hypoxia in clinic. PMH is significant for morbid obesity, COPD (reportedly), mild restrictive lung disease, chronic edema, HTN, HLD, hyperglycemia, peripheral neuropathy, fibromyaglia, MDD, insomnia  #Hypoxia: Unclear etiology from clinic but appears to be chonic with some element of acute hypoxia. She has a Wells score of 1.5 (if you account for her tachycardia in clinic). While not ruled out, less likely PE as her tachycardia improved and she is responding to supplemental O2. TTE in 08/2014 revealed EF 03-00%, grade 1 diastolic dysfunction, and a PA pressure of 22. ABG reveals a compensated respiratory acidosis with bicarb is grossly. She has most likely been hypoxic for sometime given this compensation and her elevated hematocrit.  Most likely due to a combination of lung disease, h/o tobacco use, and body habitus.  - Place in observation, attending Dr. Gwendlyn Deutscher - Supplemental O2 to keep O2 88-92% - Albuterol PRN (I do not feel this is an exacerbation) - Order ambulate with O2 orders tomorrow to assess for the need for home O2  - Consider d-dimer and CTA if patient's becomes unresponsive to supplemental O2, develops chest pain, tachycardia   #Hypokalemia: K 2.7 on admission. Patient states she does not take KDUR on a daily basis (has had issues with hyperkalemia in the past). Holding torsemide this PM.  Received KDUR 82mEq in the ED. EKG with some prolonged PR and T wave flattening. - Will replete with IV KCL 49mEq x 6 rounds  - Repeat BMET in the AM - Monitor on telemetry   - Repeat EKG in the AM. - Check magnesium   #COPD: Per patient, was was diagnosed with PFTs in 2009 and had a trial of Spiriva but felt it made her hoarse. PFTs in 2014 revealed mild restrictive lung disease  - Albuterol PRN  #Chronic edema: Patient on weekly metolazone and torsemide with K supplementation. Has not been able to tolerate Una boots and can't fit into compression stockings. - Continue torsemide  #HTN: Stable - Continue torsemide 20mg    #Hyperglycemia: A1c 6.3 in clinic today.  #Peripheral neuropathy/fibromyalgia: - Continue home Lyrica   #HLD: -Continue home pravastatin   #Major Depression, recurrent  - Continue home Abilify and Effexor   #Insomnia  - Continue home trazodone - Consider starting CPAP   FEN/GI: IV Saline lock, Heart healthy/carb modified.  Prophylaxis: SQ heparin   Disposition: Place in observation, attending Dr. Hollace Hayward  History of Present Illness: Kathleen Roy is a 53 y.o. female presenting after being sent over from clinic when she was found to be hypoxic to the 70s on RA as well as hypokalemic to 2.7. The patient denies any chest pain, SOB, or diaphoresis. She notes that she was on home O2 approximately 1 year ago for "about a week or so" but she felt like she didn't need it and discontinued it. She does note that she has DOE that is stable over the last year. Has 2 pillow orthopnea that is stable and denies PND. She denies any increased LE swelling. Recent diagnosed with OSA/OHS but hasn't started CPAP yet.   She endorses dizziness (head spinning, room spinning) that  lasts for approximately 1 minute for the last few months that occurs intermittently. She notes that quick movement seems to make it worse. She takes oxycodone 2 pills twice a day for chronic pain and can't determine if the dizziness occurs with the pain meds. Her weakness is stable at it's baseline.   No N/V or diarrhea.   Review Of Systems: Per HPI with the following additions:  none  Otherwise 12 point review of systems was performed and was unremarkable.  Patient Active Problem List   Diagnosis Date Noted  . Hyperglycemia 11/08/2014  . Assistance needed for grooming 11/08/2014  . Fatigue 11/03/2014  . Hyperammonemia 08/09/2014  . Memory loss 03/18/2014  . Tremor 03/18/2014  . Vulvovaginal candidiasis 11/25/2013  . Restrictive airway disease 08/26/2013  . Hypokalemia 08/19/2013  . Health care maintenance 06/17/2013  . Hypoxemia 04/13/2013  . Venous stasis dermatitis 12/02/2012  . Shoulder pain 05/13/2012  . COPD (chronic obstructive pulmonary disease) 10/15/2011  . Peripheral neuropathy 10/15/2011  . History of cellulitis and abscess 02-07-202012  . KNEE PAIN, BILATERAL 01/22/2011  . BACK PAIN, LUMBAR, CHRONIC 01/22/2011  . MIGRAINE HEADACHE 10/25/2010  . INTERTRIGO, CANDIDAL 10/31/2009  . CONSTIPATION, CHRONIC 08/22/2009  . HYPERLIPIDEMIA 04/07/2009  . EDEMA, CHRONIC 06/27/2008  . Morbid obesity 11/16/2007  . DEPRESSION, MAJOR, RECURRENT 02/12/2007  . PANIC ATTACKS 02/12/2007  . HYPERTENSION, BENIGN SYSTEMIC 02/12/2007   Past Medical History: Past Medical History  Diagnosis Date  . Normal echocardiogram 05/30/05    suboptimal study  . Obesity, morbid (more than 100 lbs over ideal weight or BMI > 40)     obese since childhood  . Fibromyalgia   . Chronic pain   . Hyperlipidemia   . Sepsis   . Depression   . Sepsis 2002  . Neuropathy   . Osteoarthritis   . Peripheral edema   . Memory disturbance    Past Surgical History: Past Surgical History  Procedure Laterality Date  . Total abdominal hysterectomy w/ bilateral salpingoophorectomy  1994  . Cholecystectomy  1995  . Appendectomy  1995  . Abd. hernia surgery 2002  2003   Social History: History  Substance Use Topics  . Smoking status: Former Smoker -- 1.00 packs/day for 30 years    Types: Cigarettes    Quit date: 01/05/2009  . Smokeless tobacco: Never Used     Comment: Already  quit  . Alcohol Use: No   Additional social history: At baseline is able to walk short distances without assistance.  Please also refer to relevant sections of EMR.  Family History: Family History  Problem Relation Age of Onset  . Stroke Mother   . Dementia Father   . Congestive Heart Failure Brother   . Diabetes Brother   . Hypertension Brother    Allergies and Medications: Allergies  Allergen Reactions  . Aspirin Hives    REACTION: hives Hasn't had in years    No current facility-administered medications on file prior to encounter.   Current Outpatient Prescriptions on File Prior to Encounter  Medication Sig Dispense Refill  . ARIPiprazole (ABILIFY) 2 MG tablet Take 1 tablet (2 mg total) by mouth daily. (Patient taking differently: Take 2 mg by mouth every other day. ) 30 tablet 3  . Ascorbic Acid (VITAMIN C) 1000 MG tablet Take 3,000 mg by mouth daily.    . metolazone (ZAROXOLYN) 2.5 MG tablet Take 1 tablet (2.5 mg total) by mouth once a week. Takes on a weekend day 30 tablet 2  .  nystatin (MYCOSTATIN) powder Apply topically 2 (two) times daily. 30 g 1  . Omega-3 Fatty Acids (FISH OIL) 1000 MG CAPS Take 2 capsules by mouth daily.    Marland Kitchen oxyCODONE-acetaminophen (PERCOCET/ROXICET) 5-325 MG per tablet Take 1 tablet by mouth every 6 (six) hours as needed for moderate pain or severe pain. (Patient taking differently: Take 2 tablets by mouth every 8 (eight) hours as needed for moderate pain or severe pain. ) 120 tablet 0  . polyethylene glycol (MIRALAX / GLYCOLAX) packet Take 17 g by mouth daily.    . potassium chloride SA (KLOR-CON M20) 20 MEQ tablet Take one tablet daily Mon-Fri, none during the weekend. 30 tablet 2  . pravastatin (PRAVACHOL) 40 MG tablet Take 1 tablet (40 mg total) by mouth at bedtime. 90 tablet 1  . pregabalin (LYRICA) 150 MG capsule Take 1 capsule (150 mg total) by mouth 2 (two) times daily. 60 capsule 2  . torsemide (DEMADEX) 20 MG tablet Take 4 tablets (80 mg  total) by mouth daily. 120 tablet 2  . trazodone (DESYREL) 300 MG tablet Take 300 mg by mouth at bedtime. ([place])    . Venlafaxine HCl 225 MG TB24 Take 1 tablet (225 mg total) by mouth daily. 90 tablet 2  . albuterol (PROVENTIL HFA;VENTOLIN HFA) 108 (90 BASE) MCG/ACT inhaler Inhale 2 puffs into the lungs every 6 (six) hours as needed for wheezing or shortness of breath. (Patient not taking: Reported on 11/08/2014) 18 g 3  . fluconazole (DIFLUCAN) 150 MG tablet Take 1 tablet (150 mg total) by mouth once. (Patient not taking: Reported on 11/08/2014) 1 tablet 0    Objective: BP 103/63 mmHg  Pulse 82  Temp(Src) 98.1 F (36.7 C) (Oral)  Resp 14  Ht 5\' 2"  (1.575 m)  Wt 362 lb (164.202 kg)  BMI 66.19 kg/m2  SpO2 95% Exam: General: Lying in bed in NAD with [place] on HEENT: Atraumatic. PERRL, EOMI. MMM, Oropharynx clear Cardiovascular: Distant heart sounds. RRR, no m/r/g noted. 1+ radial pulses, unable to palpate DP pulses. No JVD noted Respiratory: No increased WOB. CTAB without wheezing, rhonchi, or crackles noted. Satting 100% on 4L  Abdomen: Obese, Soft, ND/NT. No rebound or guarding  Extremities: Lower extremities/feet puffy, however only trace pitting edema noted.  Skin: Hyperpigmentation over the shins bilaterally with dry scaly skin, hyperkeratotic changes indicative of venous stasis Neuro: A&Ox4. No gross neurologic deficits.  Labs and Imaging: CBC BMET   Recent Labs Lab 11/08/14 1350  WBC 9.3  HGB 15.6*  HCT 50.3*  PLT 165    Recent Labs Lab 11/08/14 1350  NA 144  K 2.7*  CL 89*  CO2 43*  BUN 21  CREATININE 0.76  GLUCOSE 116*  CALCIUM 10.1    ABG: pH 7.447, pCO2 76.9, pO2 82, bicarb 53.1  EKG: NSR, HR 85. Prolonged PR interval to 242ms, flattened T waves in the lateral leads. T wave inversion in in aVR and V1  CXR: Central vascular structures are mildly prominent. No clear evidence for pulmonary edema. No focal airspace disease. Heart and mediastinum  are within normal limits and stable  Archie Patten, MD 11/08/2014, 4:37 PM PGY-1, [place] Intern pager: [phone], text pages welcome  I have read and agree with the amended note as above.  Phill Myron, MD, PGY-2 8:54 PM

## 2014-11-08 NOTE — Assessment & Plan Note (Signed)
Etiology unclear but seems to be chronic. ?? Element of acute hypoxia. I will like for her to be assessed by PE. Wells score of 3 with moderate risk for PE. I encouraged her to go to the ED for further assessment. Patient discussed with charge nurse.

## 2014-11-08 NOTE — ED Notes (Signed)
Pt. Was at her Dr. Gwendlyn Deutscher s for a check -up and was found to have low oxygen levels.  Pt. Was sent to [place] for further evaluation.   Pt. Denies any sob or chest pain.  Pt. Is pink, warm and dry.  Oxygen levels are 80% on RA Placed pt. On oxygen [place] 4 liters sats are at 92%.  Pt. Is alert and oriented X4.

## 2014-11-08 NOTE — ED Provider Notes (Addendum)
CSN: [id number]     Arrival date & time 11/08/14  1301 History   First MD Initiated Contact with Patient 11/08/14 1337     Chief Complaint  Patient presents with  . Hypoxia      (Consider location/radiation/quality/duration/timing/severity/associated sxs/prior Treatment) HPI   Kathleen Roy is a 53 y.o. female who was sent here by her PCP for evaluation of hypoxia, which was discovered incidentally, during a visit today, to follow up on laboratory testing. Pt. Denies dizziness, weakness, persistent SOB, fever or cough.  She has SOB at night when attempting to sleep. She was on O2 until 1.5 years, when she stopped on her own. She is an ex-smoker. She had a sleep apnea study recently and is due to have a CPAP machine delivered to her home next Wednesday (1 week). There are no other known modifying factors.  Past Medical History  Diagnosis Date  . Normal echocardiogram 05/30/05    suboptimal study  . Obesity, morbid (more than 100 lbs over ideal weight or BMI > 40)     obese since childhood  . Fibromyalgia   . Chronic pain   . Hyperlipidemia   . Sepsis   . Depression   . Sepsis 2002  . Neuropathy   . Osteoarthritis   . Peripheral edema   . Memory disturbance    Past Surgical History  Procedure Laterality Date  . Total abdominal hysterectomy w/ bilateral salpingoophorectomy  1994  . Cholecystectomy  1995  . Appendectomy  1995  . Abd. hernia surgery 2002  2003   Family History  Problem Relation Age of Onset  . Stroke Mother   . Dementia Father   . Congestive Heart Failure Brother   . Diabetes Brother   . Hypertension Brother    History  Substance Use Topics  . Smoking status: Former Smoker -- 1.00 packs/day for 30 years    Types: Cigarettes    Quit date: 01/05/2009  . Smokeless tobacco: Never Used     Comment: Already quit  . Alcohol Use: No   OB History    Gravida Para Term Preterm AB TAB SAB Ectopic Multiple Living   0 0 0 0 0 0 0 0 0 0      Review of Systems   All other systems reviewed and are negative.     Allergies  Aspirin  Home Medications   Prior to Admission medications   Medication Sig Start Date End Date Taking? Authorizing Provider  ARIPiprazole (ABILIFY) 2 MG tablet Take 1 tablet (2 mg total) by mouth daily. Patient taking differently: Take 2 mg by mouth every other day.  07/01/14  Yes Andrena Mews, MD  Ascorbic Acid (VITAMIN C) 1000 MG tablet Take 3,000 mg by mouth daily.   Yes Historical Provider, MD  metolazone (ZAROXOLYN) 2.5 MG tablet Take 1 tablet (2.5 mg total) by mouth once a week. Takes on a weekend day 11/25/13  Yes Andrena Mews, MD  nystatin (MYCOSTATIN) powder Apply topically 2 (two) times daily. 11/25/13  Yes Andrena Mews, MD  Omega-3 Fatty Acids (FISH OIL) 1000 MG CAPS Take 2 capsules by mouth daily.   Yes Historical Provider, MD  oxyCODONE-acetaminophen (PERCOCET/ROXICET) 5-325 MG per tablet Take 1 tablet by mouth every 6 (six) hours as needed for moderate pain or severe pain. Patient taking differently: Take 2 tablets by mouth every 8 (eight) hours as needed for moderate pain or severe pain.  10/25/14  Yes Andrena Mews, MD  polyethylene glycol (MIRALAX / GLYCOLAX) packet  Take 17 g by mouth daily.   Yes Historical Provider, MD  potassium chloride SA (KLOR-CON M20) 20 MEQ tablet Take one tablet daily Mon-Fri, none during the weekend. 10/10/14  Yes Andrena Mews, MD  pravastatin (PRAVACHOL) 40 MG tablet Take 1 tablet (40 mg total) by mouth at bedtime. 09/22/14  Yes Andrena Mews, MD  pregabalin (LYRICA) 150 MG capsule Take 1 capsule (150 mg total) by mouth 2 (two) times daily. 09/08/14  Yes Lupita Dawn, MD  torsemide (DEMADEX) 20 MG tablet Take 4 tablets (80 mg total) by mouth daily. 08/18/14  Yes Andrena Mews, MD  trazodone (DESYREL) 300 MG tablet Take 300 mg by mouth at bedtime. (PER MENTAL HEALTH)   Yes Historical Provider, MD  Venlafaxine HCl 225 MG TB24 Take 1 tablet (225 mg total) by mouth daily. 07/05/14   Yes Andrena Mews, MD  albuterol (PROVENTIL HFA;VENTOLIN HFA) 108 (90 BASE) MCG/ACT inhaler Inhale 2 puffs into the lungs every 6 (six) hours as needed for wheezing or shortness of breath. Patient not taking: Reported on 11/08/2014 08/19/13   Andrena Mews, MD  fluconazole (DIFLUCAN) 150 MG tablet Take 1 tablet (150 mg total) by mouth once. Patient not taking: Reported on 11/08/2014 11/25/13   Andrena Mews, MD   BP 103/63 mmHg  Pulse 82  Temp(Src) 98.1 F (36.7 C) (Oral)  Resp 14  Ht 5\' 2"  (1.575 m)  Wt 362 lb (164.202 kg)  BMI 66.19 kg/m2  SpO2 95% Physical Exam  Constitutional: She is oriented to person, place, and time. She appears well-developed.  Morbid obesity  HENT:  Head: Normocephalic and atraumatic.  Right Ear: External ear normal.  Left Ear: External ear normal.  Eyes: Conjunctivae and EOM are normal. Pupils are equal, round, and reactive to light.  Neck: Normal range of motion and phonation normal. Neck supple.  Cardiovascular: Normal rate, regular rhythm and normal heart sounds.   Pulmonary/Chest: Effort normal. No respiratory distress. She exhibits no bony tenderness.  Decreased air mvt., bilateral. No wheeze or rhonchi.  Abdominal: Soft. She exhibits no distension. There is no tenderness.  Musculoskeletal: Normal range of motion. She exhibits edema (Bilateral, 2+. ). She exhibits no tenderness.  Neurological: She is alert and oriented to person, place, and time. No cranial nerve deficit or sensory deficit. She exhibits normal muscle tone. Coordination normal.  Skin: Skin is warm, dry and intact.  Psychiatric: She has a normal mood and affect. Her behavior is normal. Judgment and thought content normal.  Nursing note and vitals reviewed.   ED Course  Procedures (including critical care time)  Hypoxia, normalized with nasal cannula oxygen  ABG ordered to assess for respiratory failure.  The ABG is consistent with chronic respiratory failure with  hypercapnia  Potassium given orally for hypokalemia  Medications  potassium chloride SA (K-DUR,KLOR-CON) CR tablet 40 mEq (not administered)    Patient Vitals for the past 24 hrs:  BP Temp Temp src Pulse Resp SpO2 Height Weight  11/08/14 1545 - 98.1 F (36.7 C) Oral - - - - -  11/08/14 1500 103/63 mmHg - - 82 14 95 % - -  11/08/14 1430 110/55 mmHg - - 89 21 95 % - -  11/08/14 1351 - - - 87 22 94 % - -  11/08/14 1350 106/63 mmHg - - 90 - 95 % - -  11/08/14 1317 - - - - - - 5\' 2"  (1.575 m) (!) 362 lb (164.202 kg)  11/08/14 1315 127/64 mmHg 97.9 F (36.6 C)  Oral 88 20 93 % - -    4:49 PM Reevaluation with update and discussion. After initial assessment and treatment, an updated evaluation reveals no change in status. Saniya Tranchina L   CRITICAL CARE Performed by: Daleen Bo L Total critical care time: 35 minutes Critical care time was exclusive of separately billable procedures and treating other patients. Critical care was necessary to treat or prevent imminent or life-threatening deterioration. Critical care was time spent personally by me on the following activities: development of treatment plan with patient and/or surrogate as well as nursing, discussions with consultants, evaluation of patient's response to treatment, examination of patient, obtaining history from patient or surrogate, ordering and performing treatments and interventions, ordering and review of laboratory studies, ordering and review of radiographic studies, pulse oximetry and re-evaluation of patient's condition.   Labs Review Labs Reviewed  BASIC METABOLIC PANEL - Abnormal; Notable for the following:    Potassium 2.7 (*)    Chloride 89 (*)    CO2 43 (*)    Glucose, Bld 116 (*)    All other components within normal limits  CBC WITH DIFFERENTIAL - Abnormal; Notable for the following:    Hemoglobin 15.6 (*)    HCT 50.3 (*)    MCV 102.0 (*)    RDW 16.5 (*)    All other components within normal limits   I-STAT ARTERIAL BLOOD GAS, ED - Abnormal; Notable for the following:    pCO2 arterial 76.9 (*)    Bicarbonate 53.1 (*)    Acid-Base Excess 23.0 (*)    All other components within normal limits  TROPONIN I  BLOOD GAS, ARTERIAL    Imaging Review Dg Chest 2 View  11/08/2014   CLINICAL DATA:  Sudden onset of hypoxia.  Low oxygen saturations.  EXAM: CHEST  2 VIEW  COMPARISON:  04/11/2013  FINDINGS: Central vascular structures are mildly prominent. No clear evidence for pulmonary edema. No focal airspace disease. Heart and mediastinum are within normal limits and stable. The trachea is midline.  IMPRESSION: Question mild vascular congestion. Otherwise, no acute chest findings.   Electronically Signed   By: Markus Daft M.D.   On: 11/08/2014 16:23     EKG Interpretation   Date/Time:  Tuesday November 08 2014 13:52:27 EST Ventricular Rate:  85 PR Interval:  212 QRS Duration: 130 QT Interval:  402 QTC Calculation: 478 R Axis:   -81 Text Interpretation:  Sinus rhythm Prolonged PR interval Left atrial  enlargement Right bundle branch block Left ventricular hypertrophy  Anterolateral infarct, age indeterminate since last tracing no significant  change Confirmed by Eulis Foster  MD, Manly Nestle [phone]) on 11/08/2014 4:52:26 PM      MDM   Final diagnoses:  Chronic respiratory failure with hypoxia  Hypokalemia  Sleep apnea    Chronic respiratory failure, with hypoxia on room air (77% at 1202 today at [facility])  and hypercapnia.  Incidental hypokalemia.  She has a reported history of sleep apnea.  She will require admission for stabilization and treatment.   Nursing Notes Reviewed/ Care Coordinated, and agree without changes. Applicable Imaging Reviewed.  Interpretation of Laboratory Data incorporated into ED treatment  Plan: Admit    Richarda Blade, MD 11/08/14 Mustang, MD 11/08/14 Roff, MD 11/08/14 [phone]

## 2014-11-08 NOTE — ED Notes (Signed)
Dr Jacubowitz in w/pt. 

## 2014-11-08 NOTE — Telephone Encounter (Signed)
Patient sent to ED to assess for hypoxemia.

## 2014-11-08 NOTE — Assessment & Plan Note (Signed)
BP slightly elevated because it was taken right after she ambulated from the front office to the back. I will continue current dose of Torsemide for now. Reassess at next visit.

## 2014-11-08 NOTE — Assessment & Plan Note (Signed)
Referred to podiatrist for toe nail grooming.

## 2014-11-08 NOTE — Assessment & Plan Note (Signed)
Recheck Bmet today.

## 2014-11-08 NOTE — ED Notes (Signed)
PT reports no distress except pain which is chronic. O2 sats checked by RN first, 98% at rest.

## 2014-11-08 NOTE — ED Notes (Signed)
Admitting at bedside 

## 2014-11-08 NOTE — Patient Instructions (Signed)
BP is a little high today, but it could be due to stress from walking, please reduce salt in your diet, continue lasix. We will get lab done today and I will call with result. I will like to see you back in 3 months. But sooner if any problem.   Cooking with Less Salt Cooking with less salt is one way to reduce the amount of sodium you get from food. Sodium raises blood pressure and causes water to be held in the body. Getting less sodium from food may help lower your blood pressure, reduce any swelling, and protect your heart, liver, and kidneys.  WHAT DO I NEED TO KNOW ABOUT COOKING WITH LESS SALT?  Buy sodium-free or low-sodium products. Look on the label for the words:   Lower-sodium.  Sodium-free.   Sodium-reduced.   No salt added.   Unsalted.  Check the food label before using or buying packaged ingredients.  Look for products with no more than 150 mg of sodium in one serving.  Do not choose foods with salt as one of the first three ingredients on the ingredients list. If salt is one of the first three ingredients, it usually means the item is high in sodium because ingredients are listed in order of amount in the food item.  Use herbs, seasonings without salt, and spices as substitutes for salt in foods.  Use sodium-free baking soda when baking. WHAT ARE SOME SALT ALTERNATIVES? The following are herbs, seasonings, and spices that can be used instead of salt to give taste to your food. Next to their names are foods they can be used to flavor.  Herbs  Bay-Soups, meat and vegetable dishes, and spaghetti sauce.   Basil-Italian dishes, soups, pasta, and fish dishes.   Cilantro-Meat, poultry, and vegetable dishes.   Chili Powder-Marinades and Mexican dishes.   Chives-Salad dressings and potato dishes.   Cumin-Mexican dishes, couscous, and meat dishes.   Dill-Fish dishes, sauces, and salads.   Fennel-Meat and vegetable dishes, breads, and cookies.    Garlic (do not use garlic salt)-Italian dishes, meat dishes, salad dressings, and sauces.   Marjoram-Soups, potato dishes, and meat dishes.   Oregano-Pizza and spaghetti sauce.   Parsley-Salads, soups, pasta, and meat dishes.   Rosemary-Italian dishes, salad dressings, soups, and red meats.   Saffron-Fish dishes, pasta, and some poultry dishes.   Sage-Stuffings and sauces.   Tarragon-Fish and [employer].   Thyme-Stuffing, meat, and fish dishes.  Herbs should be fresh or dried. Do not choose packaged mixes.  Seasonings  Lemon juice-Fish dishes, poultry dishes, vegetables, and salads.   Vinegar-Salad dressings, vegetables, and fish dishes.  Spices  Cinnamon-Sweet dishes (such as cakes, cookies, and puddings).   Cloves-Gingerbread, puddings, and marinades for meats.   Curry-Vegetable dishes, fish and poultry dishes, and stir-fry dishes.   Ginger-Vegetables dishes, fish dishes, and stir-fry dishes.   Nutmeg-Pasta, vegetables, poultry, fish dishes, and custard.  WHAT ARE SOME LOW-SODIUM INGREDIENTS AND FOODS?  Fresh or frozen fruits and vegetables with no sauce added.  Fresh or frozen whole meats, poultry, and fish with no sauce added.  Eggs.  Noodles, pasta, quinoa, rice.  Shredded or puffed wheat or puffed rice.  Regular or quick oats.  Milk, yogurt, low-sodium cheeses and hard cheeses (such as cheddar, [employer], or mozzarella). Always check the label for serving size and sodium content.  Unsalted butter or margarine.  Unsalted nuts.  Sherbet or ice cream (keep to  cup serving).  Homemade pudding.  Sodium-free baking  soda and baking powder. This is not a complete list of low-sodium ingredients and foods. Contact your dietitian for more options.  WHAT HIGH-SODIUM INGREDIENTS ARE NOT RECOMMENDED?  Sauces, such as mustard, barbecue sauce, soy sauce, teriyaki sauce, steak sauce, chili sauce, cocktail sauce, and tartar  sauce.  Mixes, such as flavored rice.  Instant products, such as ready-made pasta.  Horseradish.  Salsa.   Packaged gravies.   Angie Fava.  Olives.  Sauerkraut.  Salted nuts.   Cured or smoked meats (such as hot dogs, bacon, salami, ham, and bologna).   Processed vegetable juices, such as tomato juice.  Buttermilk.  Processed cheeses (such as cheese dips or cheese spread).  Cottage cheese.  Instant hot cereals.  Dessert mixes (ready-to-make) and store-bought cakes and pies.  Crackers with salted tops. This is not a complete list of high-sodium ingredients. Contact your dietitian for more options.  Document Released: 12/02/2005 Document Revised: 12/07/2013 Document Reviewed: 10/25/2013 [facility] Patient Information 2015 [place], [place]. This information is not intended to replace advice given to you by your health care provider. Make sure you discuss any questions you have with your health care provider.

## 2014-11-08 NOTE — Progress Notes (Addendum)
Subjective:     Patient ID: Kathleen Roy, female   DOB: 1961-05-05, 53 y.o.   MRN: [id number]  HPI  [id number] only on Torsemide 80 mg qd, had been on antihypertensive in the past. Her BP runs high whenever she tries to walk. Denies any headache. Hypokalemia:Now on Kdur 20 mg daily for 5 days. Here for follow up. Hyperglycemia:Here for A1C check. Poor O2 RCV:ELFYBOF has issue with poor oxygen saturation in the past, she denies chest pain or SOB at baseline but with ambulation she has hard time breathing. She was on home oxygen in the past but self discontinued it. Toe nail problem:Patient stated her toes are so small that whenever she tries to cut her toe nail she will cut her skin, she is requesting podiatry referral.  Current Outpatient Prescriptions on File Prior to Visit  Medication Sig Dispense Refill  . acetaminophen (TYLENOL) 500 MG tablet Take 500 mg by mouth every 6 (six) hours as needed for pain.     Marland Kitchen albuterol (PROVENTIL HFA;VENTOLIN HFA) 108 (90 BASE) MCG/ACT inhaler Inhale 2 puffs into the lungs every 6 (six) hours as needed for wheezing or shortness of breath. 18 g 3  . ARIPiprazole (ABILIFY) 2 MG tablet Take 1 tablet (2 mg total) by mouth daily. 30 tablet 3  . Ascorbic Acid (VITAMIN C) 1000 MG tablet Take 3,000 mg by mouth daily.    . fluconazole (DIFLUCAN) 150 MG tablet Take 1 tablet (150 mg total) by mouth once. 1 tablet 0  . metolazone (ZAROXOLYN) 2.5 MG tablet Take 1 tablet (2.5 mg total) by mouth once a week. Takes on a weekend day 30 tablet 2  . Multiple Vitamins-Minerals (CENTRUM SILVER ADULT 50+ PO) Take 1 tablet by mouth daily.    Marland Kitchen nystatin (MYCOSTATIN) powder Apply topically 2 (two) times daily. 30 g 1  . Omega-3 Fatty Acids (FISH OIL) 1000 MG CAPS Take 2 capsules by mouth daily.    Marland Kitchen oxyCODONE-acetaminophen (PERCOCET/ROXICET) 5-325 MG per tablet Take 1 tablet by mouth every 6 (six) hours as needed for moderate pain or severe pain. 120 tablet 0  . polyethylene glycol  (MIRALAX / GLYCOLAX) packet Take 17 g by mouth daily.    . potassium chloride SA (KLOR-CON M20) 20 MEQ tablet Take one tablet daily Mon-Fri, none during the weekend. 30 tablet 2  . pravastatin (PRAVACHOL) 40 MG tablet Take 1 tablet (40 mg total) by mouth at bedtime. 90 tablet 1  . pregabalin (LYRICA) 150 MG capsule Take 1 capsule (150 mg total) by mouth 2 (two) times daily. 60 capsule 2  . torsemide (DEMADEX) 20 MG tablet Take 4 tablets (80 mg total) by mouth daily. 120 tablet 2  . trazodone (DESYREL) 300 MG tablet Take 300 mg by mouth at bedtime. ([place])    . Venlafaxine HCl 225 MG TB24 Take 1 tablet (225 mg total) by mouth daily. 90 tablet 2   No current facility-administered medications on file prior to visit.   Past Medical History  Diagnosis Date  . Normal echocardiogram 05/30/05    suboptimal study  . Obesity, morbid (more than 100 lbs over ideal weight or BMI > 40)     obese since childhood  . Fibromyalgia   . Chronic pain   . Hyperlipidemia   . Sepsis   . Depression   . Sepsis 2002  . Neuropathy   . Osteoarthritis   . Peripheral edema   . Memory disturbance       Review  of Systems  Respiratory: Negative for cough, chest tightness and wheezing.        SOB only on exertion.  Cardiovascular: Negative.   Gastrointestinal: Negative.   Genitourinary: Negative.   All other systems reviewed and are negative.      Filed Vitals:   11/08/14 1152 11/08/14 1202 11/08/14 1210  BP: 152/80    Pulse: 111 98   Temp: 97.7 F (36.5 C)    TempSrc: Oral    Height: 5\' 2"  (1.575 m)    Weight: 364 lb 3.2 oz (165.2 kg)    SpO2:  77% 82%     Objective:   Physical Exam  Constitutional: She appears well-developed. No distress.  Cardiovascular: Normal rate, regular rhythm and normal heart sounds.   No murmur heard. Pulmonary/Chest: Effort normal and breath sounds normal. No respiratory distress. She has no wheezes. She exhibits no tenderness.  Abdominal: Soft. Bowel  sounds are normal. She exhibits no distension and no mass. There is no tenderness.  Musculoskeletal:  Puffy feet, no pitting edema.Skin over LL dry and scaly with hyperpigmentation over the shin. Toes are very small.  Psychiatric: She has a normal mood and affect.  Nursing note and vitals reviewed.      Assessment:     HTN Hypokalemia Hyperglycemia Poor O2 sat Toe nail problem : need grooming assistant.    Plan:     Check problem list.

## 2014-11-09 ENCOUNTER — Other Ambulatory Visit: Payer: Self-pay

## 2014-11-09 ENCOUNTER — Other Ambulatory Visit: Payer: Self-pay | Admitting: Family Medicine

## 2014-11-09 DIAGNOSIS — E876 Hypokalemia: Secondary | ICD-10-CM | POA: Diagnosis not present

## 2014-11-09 DIAGNOSIS — G4733 Obstructive sleep apnea (adult) (pediatric): Secondary | ICD-10-CM | POA: Insufficient documentation

## 2014-11-09 DIAGNOSIS — E872 Acidosis: Secondary | ICD-10-CM | POA: Diagnosis not present

## 2014-11-09 DIAGNOSIS — J9611 Chronic respiratory failure with hypoxia: Secondary | ICD-10-CM

## 2014-11-09 DIAGNOSIS — R0902 Hypoxemia: Secondary | ICD-10-CM | POA: Diagnosis not present

## 2014-11-09 DIAGNOSIS — E662 Morbid (severe) obesity with alveolar hypoventilation: Secondary | ICD-10-CM | POA: Insufficient documentation

## 2014-11-09 DIAGNOSIS — J449 Chronic obstructive pulmonary disease, unspecified: Secondary | ICD-10-CM | POA: Diagnosis not present

## 2014-11-09 LAB — CBC
HEMATOCRIT: 44.8 % (ref 36.0–46.0)
Hemoglobin: 13.3 g/dL (ref 12.0–15.0)
MCH: 30.2 pg (ref 26.0–34.0)
MCHC: 29.7 g/dL — AB (ref 30.0–36.0)
MCV: 101.6 fL — ABNORMAL HIGH (ref 78.0–100.0)
Platelets: 163 10*3/uL (ref 150–400)
RBC: 4.41 MIL/uL (ref 3.87–5.11)
RDW: 16.7 % — ABNORMAL HIGH (ref 11.5–15.5)
WBC: 7 10*3/uL (ref 4.0–10.5)

## 2014-11-09 LAB — BASIC METABOLIC PANEL
Anion gap: 10 (ref 5–15)
BUN: 16 mg/dL (ref 6–23)
CALCIUM: 9.3 mg/dL (ref 8.4–10.5)
CO2: 43 meq/L — AB (ref 19–32)
CREATININE: 0.8 mg/dL (ref 0.50–1.10)
Chloride: 92 mEq/L — ABNORMAL LOW (ref 96–112)
GFR calc Af Amer: >90 mL/min (ref >90)
GFR calc non Af Amer: 83 mL/min — ABNORMAL LOW (ref >90)
GLUCOSE: 107 mg/dL — AB (ref 70–99)
Potassium: 2.9 mEq/L — CL (ref 3.7–5.3)
Sodium: 145 mEq/L (ref 137–147)

## 2014-11-09 MED ORDER — CETYLPYRIDINIUM CHLORIDE 0.05 % MT LIQD
7.0000 mL | Freq: Two times a day (BID) | OROMUCOSAL | Status: DC
  Administered 2014-11-09: 7 mL via OROMUCOSAL

## 2014-11-09 MED ORDER — SPIRONOLACTONE 25 MG PO TABS
25.0000 mg | ORAL_TABLET | Freq: Every day | ORAL | Status: DC
  Administered 2014-11-09: 25 mg via ORAL
  Filled 2014-11-09: qty 1

## 2014-11-09 MED ORDER — POTASSIUM CHLORIDE CRYS ER 20 MEQ PO TBCR
40.0000 meq | EXTENDED_RELEASE_TABLET | Freq: Three times a day (TID) | ORAL | Status: DC
  Administered 2014-11-09 (×2): 40 meq via ORAL
  Filled 2014-11-09 (×2): qty 2

## 2014-11-09 MED ORDER — POTASSIUM CHLORIDE 10 MEQ/100ML IV SOLN
10.0000 meq | INTRAVENOUS | Status: DC
  Administered 2014-11-09: 10 meq via INTRAVENOUS
  Filled 2014-11-09 (×5): qty 100

## 2014-11-09 MED ORDER — TRIAMTERENE-HCTZ 37.5-25 MG PO TABS: 1.0000 | ORAL_TABLET | Freq: Every day | ORAL | Status: DC

## 2014-11-09 MED ORDER — NYSTATIN 100000 UNIT/GM EX POWD: Freq: Two times a day (BID) | CUTANEOUS | Status: DC

## 2014-11-09 MED ORDER — TRIAMTERENE-HCTZ 37.5-25 MG PO TABS
1.0000 | ORAL_TABLET | Freq: Every day | ORAL | Status: DC
  Administered 2014-11-09: 1 via ORAL
  Filled 2014-11-09: qty 1

## 2014-11-09 NOTE — Discharge Summary (Signed)
[facility] Discharge Summary  [name] Medical record number: [id number] Date of birth: 1961-02-28 Age: 53 y.o. Gender: female Date of Admission: 11/08/2014  Date of Discharge: 11/09/14 Admitting Physician: Blane Ohara McDiarmid, MD  Primary Care Provider: Andrena Mews, MD Consultants: None  Indication for Hospitalization: Hypoxia   Discharge Diagnoses/Problem List:  Hypoxia  Compensated respiratory acidosis  Hypokalemia  Mild restrictive lung disease  Chronic edema HTN Hyperglycemia  Peripheral neuropathy  Fibromyalgia  MDD, recurrent  OSA   Disposition: Home with HH-PT  Discharge Condition: Stable   Brief Hospital Course:  Kathleen Roy is a 53 y.o. female presenting with hypoxia the in the high 22s in clinic with a PMH of morbid obesity, COPD (reportedly), mild restrictive lung disease, chronic edema, HTN, HLD, hyperglycemia, peripheral neuropathy, fibromyaglia, MDD, insomnia  #Hypoxia:  ABG reveals a compensated respiratory acidosis with a grossly elevated bicarbonate. She has most likely been hypoxic for sometime given this compensation and her elevated hematocrit. Most likely due to a combination of obesity hypoventilation, h/o smoking, and mild restrictive lung disease. O2 improved with supplemental O2. CXR revealed mildly prominent central vascular structures. Patient with 83% O2 sat on RA and qualified for home O2.  #Hypokalemia: K 2.7 on admission. Patient states she does not take KDUR on a daily basis. Her PM dose of torsemide was held.EKG with some prolonged PR and T wave flattening. Received KDUR 27mEq in the ED.  S/p 6 runs of IV potassium chloride with improvement to 2.9.  Repeat EKG revealed improvement in PR duration and T wave flattening. Discharged home with KDUR 69mEQ TID x 2 days, then 11mEq daily after that. Discontinued weekly metolazone and started Maxzide.   #Chronic edema: Stable. Patient on weekly metolazone  and torsemide with K supplementation. Has not been able to tolerate Una boots and can't fit into compression stockings. Continue torsemide and starting Maxzide as above.   Home Lyrica, Pravastatin, Abilify, Effexor, and  Trazodone continued without issues. CM was consulted for assistance with expediting home CPAP and bariatric bed.   Issues for Follow Up:  --Consider work-up for hypoaldosteronism in the future  --Continue to stress the importance of weight loss  -- Continue to stress the importance of home O2 (as pt has previously self-discontinued O2 due to lack of symptoms). --Avoid calcium channel blockers and beta blockers in this patient given left fascicular block on EKG.   Significant Procedures: None   Significant Labs and Imaging:   Recent Labs Lab 11/08/14 1350 11/09/14 0525  WBC 9.3 7.0  HGB 15.6* 13.3  HCT 50.3* 44.8  PLT 165 163    Recent Labs Lab 11/08/14 1154 11/08/14 1350 11/09/14 0525  NA 146* 144 145  K 2.8* 2.7* 2.9*  CL 88* 89* 92*  CO2 42* 43* 43*  GLUCOSE 162* 116* 107*  BUN 21 21 16   CREATININE 0.82 0.76 0.80  CALCIUM 9.9 10.1 9.3  ABG: pH 7.447, pCO2 76.9, pO2 82, bicarb 53.1  EKG: NSR, HR 85. Prolonged PR interval to 267ms, flattened T waves in the lateral leads. T wave inversion in in aVR and V1, RBBB  EKG 11/25: NSR. Improved PR interval and no T wave flattening. RBBB and left fasiscular block.  CXR: Central vascular structures are mildly prominent. No clear evidence for pulmonary edema. No focal airspace disease. Heart and mediastinum are within normal limits and stable  Results/Tests Pending at Time of Discharge: None   Discharge Medications:    Medication  List    STOP taking these medications        fluconazole 150 MG tablet  Commonly known as:  DIFLUCAN     metolazone 2.5 MG tablet  Commonly known as:  ZAROXOLYN      TAKE these medications        albuterol 108 (90 BASE) MCG/ACT inhaler  Commonly known as:  PROVENTIL  HFA;VENTOLIN HFA  Inhale 2 puffs into the lungs every 6 (six) hours as needed for wheezing or shortness of breath.     ARIPiprazole 2 MG tablet  Commonly known as:  ABILIFY  Take 1 tablet (2 mg total) by mouth daily.     Fish Oil 1000 MG Caps  Take 2 capsules by mouth daily.     nystatin powder  Commonly known as:  MYCOSTATIN  Apply topically 2 (two) times daily.     oxyCODONE-acetaminophen 5-325 MG per tablet  Commonly known as:  PERCOCET/ROXICET  Take 1 tablet by mouth every 6 (six) hours as needed for moderate pain or severe pain.     polyethylene glycol packet  Commonly known as:  MIRALAX / GLYCOLAX  Take 17 g by mouth daily.     potassium chloride SA 20 MEQ tablet  Commonly known as:  KLOR-CON M20  Take one tablet daily Mon-Fri, none during the weekend.     pravastatin 40 MG tablet  Commonly known as:  PRAVACHOL  Take 1 tablet (40 mg total) by mouth at bedtime.     pregabalin 150 MG capsule  Commonly known as:  LYRICA  Take 1 capsule (150 mg total) by mouth 2 (two) times daily.     torsemide 20 MG tablet  Commonly known as:  DEMADEX  Take 4 tablets (80 mg total) by mouth daily.     trazodone 300 MG tablet  Commonly known as:  DESYREL  Take 300 mg by mouth at bedtime. (PER MENTAL HEALTH)     triamterene-hydrochlorothiazide 37.5-25 MG per tablet  Commonly known as:  MAXZIDE-25  Take 1 tablet by mouth daily.     Venlafaxine HCl 225 MG Tb24  Take 1 tablet (225 mg total) by mouth daily.     vitamin C 1000 MG tablet  Take 3,000 mg by mouth daily.        Discharge Instructions: Please refer to Patient Instructions section of EMR for full details.  Patient was counseled important signs and symptoms that should prompt return to medical care, changes in medications, dietary instructions, activity restrictions, and follow up appointments.   Follow-Up Appointments: Follow-up Information    Follow up with [place], York Ram, MD On 11/15/2014.   Specialty:  Family  Medicine   Why:  at 1:30pm for a hospital follow up   Contact information:   [address] [place] [address] [phone]       Follow up with [facility].   Why:  Home oxygen and hospital bed , oxygen will be delivered to patient hospital room    Contact information:   [place]  [address] [phone]       Follow up with [facility].   Why:  HHPT   Contact information:   [place] [address] [phone]       Archie Patten, MD 11/11/2014, 10:54 AM PGY-1, [place]

## 2014-11-09 NOTE — Discharge Instructions (Signed)
Be sure to follow up closely with your doctor as scheduled.   You will need to take extra potassium for the next 3 days, please take it 3 times a day for the next 2 days then once daily after that.   Be sure to wear your oxygen, especially while walking.

## 2014-11-09 NOTE — Care Management Note (Signed)
    Page 1 of 2   11/09/2014     2:55:57 PM CARE MANAGEMENT NOTE 11/09/2014  Patient:  Kathleen Roy, Kathleen Roy   Account Number:  [id number]  Date Initiated:  11/09/2014  Documentation initiated by:  Tomi Bamberger  Subjective/Objective Assessment:   dx hypoxia, copd  admit- from home.     Action/Plan:   Anticipated DC Date:  11/09/2014   Anticipated DC Plan:  [place]  CM consult      PAC Choice  [place]   Choice offered to / List presented to:  C-1 Patient   DME arranged  HOSPITAL BED  [facility]      DME agency  [place]     HH arranged  HH-2 PT      [facility].   Status of service:  Completed, signed off Medicare Important Message given?  YES (If response is "NO", the following Medicare IM given date fields will be blank) Date Medicare IM given:  11/09/2014 Medicare IM given by:  Tomi Bamberger Date Additional Medicare IM given:   Additional Medicare IM given by:    Discharge Disposition:  [place]  Per UR Regulation:  Reviewed for med. necessity/level of care/duration of stay  If discussed at [place] of Stay Meetings, dates discussed:    Comments:  11/09/14 [place], BSN [phone] NCM recieved referral for bariatric hospital bed and home oxygen,  Patient has Mcarthur Rossetti , so she went with [name], referral faxed and called in to [facility] .  NCM spoke with Jeneen Rinks and he states they will deliver the portable oxygen to patient's room today within a couple of hours.  Patient also chose [facility] for HHPT, referral given to [facility], [place] notified.  Soc will begin 24-48 hrs post dc.

## 2014-11-09 NOTE — Progress Notes (Signed)
Discharge instructions given. Pt verbalized understanding and all questions were answered. Oxygen should be here within 30 mins from [employer].

## 2014-11-09 NOTE — Progress Notes (Signed)
Family Medicine Teaching Service Daily Progress Note Intern Pager: [phone]  [name] Medical record number: [id number] Date of birth: May 24, 1961 Age: 53 y.o. Gender: female  Primary Care Provider: Andrena Mews, MD Consultants: None  Code Status: Full per discussion on admission  Pt Overview and Major Events to Date:  11/24: admitted due to hypoxia with no associated symptoms  11/25: endorses weakness  Assessment and Plan: Kathleen Roy is a 53 y.o. female presenting with hypoxia the in the 51s in clinic with a PMH of morbid obesity, COPD (reportedly), mild restrictive lung disease, chronic edema, HTN, HLD, hyperglycemia, peripheral neuropathy, fibromyaglia, MDD, insomnia  #Hypoxia: ABG reveals a compensated respiratory acidosis with bicarb is grossly elevated. She has most likely been hypoxic for sometime given this compensation and her elevated hematocrit. Most likely due to a combination of obesity hypoventilation, h/o smoking, and mild restrictive lung disease.  - O2 improved with supplemental O2. - Supplemental O2 to keep O2 88-92%  - albuterol PRN  - order to ambulate with pulse ox for qualification of home O2 - if chest pain, tachycardia, or pt is unresponsive to O2, consider d-dimer or CTA   #Hypokalemia: K 2.7 on admission. Patient states she does not take KDUR on a daily basis . Received KDUR 13mEq in the ED. S/p 6 runs of IV potassium chloride. EKG with some prolonged PR and T wave flattening. Repeat EKG revealed improvement in this with stable RBBB and left fascicular block - Will replete with KDUR 58mEq TID x 2 days - Home regimen should be KDUR 41mEq daily  - Will discontinue metolazone and start Maxzide  - continue to monitor - consider hyperaldosteronism   #COPD: Per patient, was was diagnosed with PFTs in 2009 and had a trial of Spiriva but felt it made her hoarse. PFTs in 2014 revealed mild restrictive lung disease  - Albuterol PRN  #Chronic  edema: Patient on weekly metolazone and torsemide with K supplementation as an outpatient. Has not been able to tolerate Una boots and can't fit into compression stockings. - Continue home torsemide   #HTN: Stable - Continue torsemide 20mg    #Hyperglycemia: A1c 6.3 in clinic on 11/24.  #Peripheral neuropathy/fibromyalgia: - Continue home Lyrica   #HLD: -Continue home pravastatin   #Major Depression, recurrent  - Continue home Abilify and Effexor   #Insomnia  - Continue home trazodone  #OSA: Patient to get CPAP/BiPAP delivered on Wednesday - Contacted CM for possibility to expedite this process - DM request for bariatric bed given pt's hypoventilation and requirement of her head to be >30 elevated  #Weakness: Endorsed to RN this afternoon -Will get PT evaluation  FEN/GI: IV [place] lock, Heart healthy/carb modified.  Prophylaxis: SQ heparin   Disposition: Pending PT evaluation   Subjective:  Patient with no SOB or chest pain. LE swelling stable.  Objective: Temp:  [97.7 F (36.5 C)-98.7 F (37.1 C)] 97.9 F (36.6 C) (11/25 0557) Pulse Rate:  [81-111] 85 (11/25 0557) Resp:  [14-22] 22 (11/25 0557) BP: (103-152)/(39-80) 121/70 mmHg (11/25 0557) SpO2:  [77 %-98 %] 94 % (11/25 0557) Weight:  [362 lb (164.202 kg)-364 lb 3.2 oz (165.2 kg)] 362 lb (164.202 kg) (11/24 1317) Physical Exam: General: Sitting up in bed in NAD with [place]  HEENT: Atraumatic. PERRL, EOMI. MMM, Oropharynx clear Cardiovascular: Distant heart sounds. RRR, no m/r/g noted. 1+ radial pulses, unable to palpate DP pulses. No JVD noted Respiratory: No increased WOB. CTAB without wheezing, rhonchi, or crackles noted. Satting  100% on 4L [place] Abdomen: Obese, Soft, ND/NT. No rebound or guarding  Extremities: Lower extremities/feet puffy, however only trace pitting edema noted.  Skin: Hyperpigmentation over the shins bilaterally with dry scaly skin, hyperkeratotic changes indicative of venous  stasis  Laboratory:  Recent Labs Lab 11/08/14 1350 11/09/14 0525  WBC 9.3 7.0  HGB 15.6* 13.3  HCT 50.3* 44.8  PLT 165 163    Recent Labs Lab 11/08/14 1154 11/08/14 1350 11/09/14 0525  NA 146* 144 145  K 2.8* 2.7* 2.9*  CL 88* 89* 92*  CO2 42* 43* 43*  BUN 21 21 16   CREATININE 0.82 0.76 0.80  CALCIUM 9.9 10.1 9.3  GLUCOSE 162* 116* 107*   ABG: pH 7.447, pCO2 76.9, pO2 82, bicarb 53.1  EKG: NSR, HR 85. Prolonged PR interval to 213ms, flattened T waves in the lateral leads. T wave inversion in in aVR and V1, RBBB  EKG 11/25: NSR. Improved PR interval and no T wave flattening. RBBB and left fasiscular block.  CXR: Central vascular structures are mildly prominent. No clear evidence for pulmonary edema. No focal airspace disease. Heart and mediastinum are within normal limits and stable  Archie Patten, MD 11/09/2014, 11:46 AM PGY-1, [place] Intern pager: [phone], text pages welcome

## 2014-11-09 NOTE — Progress Notes (Signed)
Critical Lab    2.9 Potassium, MD aware.  5 runs of potassium IV ordered, first dose infusing.

## 2014-11-09 NOTE — Care Management (Signed)
ED CM received call from [facility] on floor concerning portable O2 delivery ETA from [employer]. [place], spoke with  Raquel,ETA should be within 30 mins. Notified Surveyor, quantity on floor. No further CM needs identified.

## 2014-11-09 NOTE — Progress Notes (Signed)
UR completed 

## 2014-11-09 NOTE — Progress Notes (Signed)
Pt arrive to unit in no s/s of distress. Pt A&Ox4. Pt on bariatric bed. Pt's brother at bedside. Pt and family oriented to unit and room. VS stable. DBP low. O2 low on RA - pt on 4L [place] in ED - pt placed back on 4L [place] once settled back in bed after using the [facility]. Tele applied. Whiteboard updated. Dayshift nurse receive report prior to pt's arrival. Will continue to monitor.

## 2014-11-09 NOTE — Addendum Note (Signed)
Addended by: Andrena Mews T on: 11/09/2014 05:04 AM   Modules accepted: Level of Service

## 2014-11-09 NOTE — Evaluation (Signed)
Physical Therapy Evaluation Patient Details Name: Kathleen Roy MRN: [id number] DOB: 1961/05/25 Today's Date: 11/09/2014   History of Present Illness  53 Y/O F with PMX of morbid obesity, restrictive lung dx,HTN,HLD,chronic B/L Knee pain (DJD),peripheral neuropathy, was sent to the hospital from the clinic after she was found to have low O2 sat, patient denies any SOB at rest but with ambulation at times she has SOB,denies chest pain, no cough,no fever. She has had issue with low oxygen in the past for which she was on home O2, but she self discontinued it. She was also recently diagnosed with OSA scheduled to have her BIPAP machine delivered next week. Here O2 sat in the clinic at rest was between 77%-82%. HR at the clinic was 111.  Clinical Impression  Pt will continue to need a minimal amount of assist from her brother at home, but managed general mobility well in a home-like environment.  She could benefit from HHPT to get some structured/monitored therapy/activity.    Follow Up Recommendations Home health PT;Supervision for mobility/OOB    Equipment Recommendations  None recommended by PT    Recommendations for Other Services       Precautions / Restrictions Precautions Precautions: Fall Precaution Comments: need O2 at home Restrictions Weight Bearing Restrictions: No      Mobility  Bed Mobility Overal bed mobility: Needs Assistance Bed Mobility: Supine to Sit     Supine to sit: Min assist;HOB elevated (with legs)     General bed mobility comments: Assisted R leg off bed and pt neede no further assist, but used rail   Transfers Overall transfer level: Needs assistance Equipment used: 1 person hand held assist Transfers: Sit to/from Stand Sit to Stand: Min guard         General transfer comment: Effortful, but generally safe sit to/from stand  Ambulation/Gait Ambulation/Gait assistance: Min assist Ambulation Distance (Feet): 25 Feet (x2, with rest in between.   See O2 qualification note) Assistive device: 1 person hand held assist Gait Pattern/deviations: Step-through pattern Gait velocity: slow   General Gait Details: Waddle-type gait pattern, but generally stable  Stairs            Wheelchair Mobility    Modified Rankin (Stroke Patients Only)       Balance Overall balance assessment: Needs assistance Sitting-balance support: No upper extremity supported Sitting balance-Leahy Scale: Fair     Standing balance support: No upper extremity supported Standing balance-Leahy Scale: Fair                               Pertinent Vitals/Pain Pain Assessment: No/denies pain    Home Living Family/patient expects to be discharged to:: Private residence Living Arrangements: Other relatives (brother has come to live with her) Available Help at Discharge: Family;Available 24 hours/day Type of Home: House Home Access: Stairs to enter     Home Layout: One level Home Equipment: Cane - single point;Bedside commode;Shower seat (wants bariatric hospital bed)      Prior Function Level of Independence: Needs assistance   Gait / Transfers Assistance Needed: independent with assistive device  ADL's / Homemaking Assistance Needed: needs asssit from brother for some dressing and pericare needs        Hand Dominance        Extremity/Trunk Assessment   Upper Extremity Assessment: Overall WFL for tasks assessed           Lower Extremity Assessment: Overall [facility]  for tasks assessed;Generalized weakness;RLE deficits/detail;LLE deficits/detail RLE Deficits / Details: stiff and diminished knee AROM, strength grossly 4-/5 LLE Deficits / Details: WFL, grossly weak at >=4/5     Communication   Communication: No difficulties  Cognition Arousal/Alertness: Awake/alert Behavior During Therapy: WFL for tasks assessed/performed Overall Cognitive Status: Within Functional Limits for tasks assessed                       General Comments      Exercises        Assessment/Plan    PT Assessment All further PT needs can be met in the next venue of care  PT Diagnosis Generalized weakness;Difficulty walking;Other (comment) (decreased activity tolerance)   PT Problem List Decreased strength;Decreased range of motion;Decreased activity tolerance;Decreased mobility;Decreased knowledge of use of DME;Cardiopulmonary status limiting activity;Obesity  PT Treatment Interventions     PT Goals (Current goals can be found in the Care Plan section) Acute Rehab PT Goals PT Goal Formulation: All assessment and education complete, DC therapy    Frequency     Barriers to discharge        Co-evaluation               End of Session Equipment Utilized During Treatment: Oxygen Activity Tolerance: Patient tolerated treatment well;Patient limited by fatigue Patient left: in chair;with call bell/phone within reach Nurse Communication: Mobility status    Functional Assessment Tool Used: clinical judgement Functional Limitation: Mobility: Walking and moving around Mobility: Walking and Moving Around Current Status ([id number]): At least 1 percent but less than 20 percent impaired, limited or restricted Mobility: Walking and Moving Around Goal Status [phone]): At least 1 percent but less than 20 percent impaired, limited or restricted Mobility: Walking and Moving Around Discharge Status [phone]): At least 1 percent but less than 20 percent impaired, limited or restricted    Time: 1110-1200 PT Time Calculation (min) (ACUTE ONLY): 50 min   Charges:   PT Evaluation $Initial PT Evaluation Tier I: 1 Procedure PT Treatments $Gait Training: 8-22 mins $Therapeutic Activity: 23-37 mins   PT G Codes:   Functional Assessment Tool Used: clinical judgement Functional Limitation: Mobility: Walking and moving around    Ruthy Forry, Tessie Fass 11/09/2014, 12:48 PM 11/09/2014  Donnella Sham, [place] [phone]   (pager)

## 2014-11-09 NOTE — Progress Notes (Addendum)
SATURATION QUALIFICATIONS: (This note is used to comply with regulatory documentation for home oxygen)  Patient Saturations on Room Air at Rest = 82%  Patient Saturations on Room Air while Ambulating = 81%  Patient Saturations on 4 Liters of oxygen while Ambulating = 93%  Please briefly explain why patient needs home oxygen:  As demonstrated above, even at rest pt is not adequately oxygenating, but with O2 she can even ambulate and maintain and adequate level.  It will match well with her need for Bipap.  11/09/2014  Donnella Sham, [place] [phone]  (pager)

## 2014-11-09 NOTE — Plan of Care (Signed)
Problem: Consults Goal: Respiratory Problems Patient Education See Patient Education Module for education specifics.  Outcome: Progressing Goal: Diabetes Guidelines if Diabetic/Glucose > 140 If diabetic or lab glucose is > 140 mg/dl - Initiate Diabetes/Hyperglycemia Guidelines & Document Interventions  Outcome: Not Applicable Date Met:  18/56/31  Problem: Phase I Progression Outcomes Goal: Dyspnea controlled at rest Outcome: Progressing Goal: Hemodynamically stable Outcome: Completed/Met Date Met:  11/09/14 Goal: Pain controlled Outcome: Progressing Goal: Progress activity as tolerated unless otherwise ordered Outcome: Progressing  Problem: Phase II Progression Outcomes Goal: Pain controlled on oral analgesia Outcome: Progressing Goal: Dyspnea controlled w/progressive activity Outcome: Progressing Goal: Tolerating diet Outcome: Completed/Met Date Met:  11/09/14  Problem: Discharge Progression Outcomes Goal: Pain controlled with appropriate interventions Outcome: Progressing Goal: Tolerating diet Outcome: Completed/Met Date Met:  11/09/14 Goal: Hemodynamically stable Outcome: Completed/Met Date Met:  11/09/14

## 2014-11-09 NOTE — Progress Notes (Signed)
FMTS PCP NOTE Ronette Deter I  Saw Kathleen Roy this morning, doing well with her breathing on 4 L O2, she c/o generalized body ache which she attributed to her position in bed, she will feel better if she can get on the chair instead. I discussed with her the need to restart O2 supplement use at home in addition to the BiPAP machine she will be getting soon. She agreed with this recommendation. I discussed with inpatient attending for this patient per her recurrent hypokalemia, it will be reasonable to d/c Metolazone which she takes once a week and change to agent like aldactone weekly which is potassium sparing. Patient agreed to try this regimen. I appreciate care given to her by inpatient team, I will see her after discharge from the hospital.

## 2014-11-14 ENCOUNTER — Telehealth: Payer: Self-pay | Admitting: Family Medicine

## 2014-11-14 ENCOUNTER — Encounter: Payer: Self-pay | Admitting: Family Medicine

## 2014-11-14 ENCOUNTER — Other Ambulatory Visit: Payer: Commercial Managed Care - HMO

## 2014-11-14 NOTE — Telephone Encounter (Signed)
Pt called and wanted to let the doctor know that she is taking 1 potassium pill a day. She also said that Margarita Mail has to have a letter from the doctor ordering a bariatric bed for her to be able to have one. jw

## 2014-11-14 NOTE — Telephone Encounter (Signed)
Noted. Please print letter on EPic to fax [facility].

## 2014-11-15 ENCOUNTER — Inpatient Hospital Stay: Payer: Commercial Managed Care - HMO | Admitting: Family Medicine

## 2014-11-15 NOTE — Telephone Encounter (Signed)
Faxed to [phone]

## 2014-11-16 ENCOUNTER — Other Ambulatory Visit: Payer: Self-pay | Admitting: Family Medicine

## 2014-11-16 MED ORDER — POTASSIUM CHLORIDE CRYS ER 20 MEQ PO TBCR: 40.0000 meq | EXTENDED_RELEASE_TABLET | Freq: Every day | ORAL | Status: DC

## 2014-11-16 NOTE — Telephone Encounter (Signed)
Pt called and needs a new refill written for her potassium since they want her taking this once a day everyday. Please call in the new prescription. jw

## 2014-11-16 NOTE — Telephone Encounter (Signed)
Patient discharged home on 40 mEq/L of KDUR and Maxzide, this can increase potential for hyperkalemia although she has chronic hypokalemia. I prefer increasing potassium to 40 meq/l and change Maxzide to HCTZ. We will monitor potassium closely.

## 2014-11-16 NOTE — Telephone Encounter (Signed)
Please advise patient her potassium has been refilled. Per discharge summary she should now be on potassium 40 meq/L daily. She was also discharged home on Maxzide, combination of this with KDUR can lead to hyperkalemia. Advise her to stop Maxzide.  She may use HCTZ instead. She has f/u with Dr Jerline Pain on Dec 11, he can review her BP regimen and make appropriate adjustment.

## 2014-11-17 ENCOUNTER — Other Ambulatory Visit: Payer: Self-pay | Admitting: *Deleted

## 2014-11-17 ENCOUNTER — Other Ambulatory Visit: Payer: Self-pay | Admitting: Family Medicine

## 2014-11-17 ENCOUNTER — Telehealth: Payer: Self-pay | Admitting: Family Medicine

## 2014-11-17 MED ORDER — TORSEMIDE 20 MG PO TABS: 80.0000 mg | ORAL_TABLET | Freq: Every day | ORAL | Status: DC

## 2014-11-17 NOTE — Telephone Encounter (Signed)
Pt needs the rx for the HCTZ sent to pharmacy. Chinmay Squier,CMA

## 2014-11-17 NOTE — Telephone Encounter (Signed)
LM for patient that dr. Gwendlyn Deutscher would be calling tomorrow to discuss medications.  She will wait on hctz til after her bp follow up appt next week.  Apolonia Ellwood,CMA

## 2014-11-17 NOTE — Addendum Note (Signed)
Addended by: Valerie Roys on: 11/17/2014 09:50 AM   Modules accepted: Orders

## 2014-11-17 NOTE — Telephone Encounter (Signed)
Not due for refill till Dec 11

## 2014-11-17 NOTE — Telephone Encounter (Signed)
Please have patient wait till next visit to check her BP prior to starting this. If she is doing well with the Torsemide alone that is fine. Have her come see me soon to discuss medications. Hold Maxzide till next appointment with me.

## 2014-11-17 NOTE — Telephone Encounter (Signed)
Pt is aware of new medication regimen.  She would like to know if she can get a refill on her pain medication.  She is unsure if she has enough medication to last until next Friday when she is due to come in.  Please advise.  Thanks [employer]

## 2014-11-17 NOTE — Telephone Encounter (Signed)
[facility] called and need a silverback from [place] for the patient to be seen. jw

## 2014-11-17 NOTE — Telephone Encounter (Signed)
Pt asking to speak with Janett Billow, says she was under the assumption that a new rx was being called in for her, when she picked up her meds there wasn't anything new, just wanted to clarify.

## 2014-11-18 ENCOUNTER — Telehealth: Payer: Self-pay | Admitting: Family Medicine

## 2014-11-18 ENCOUNTER — Telehealth: Payer: Self-pay | Admitting: *Deleted

## 2014-11-18 DIAGNOSIS — I10 Essential (primary) hypertension: Secondary | ICD-10-CM

## 2014-11-18 MED ORDER — TRIAMTERENE-HCTZ 37.5-25 MG PO TABS: 1.0000 | ORAL_TABLET | Freq: Every day | ORAL | Status: DC

## 2014-11-18 NOTE — Telephone Encounter (Signed)
Will forward to MD to get approval for orders. [name],CMA

## 2014-11-18 NOTE — Telephone Encounter (Signed)
I called patient to discuss her discharge medication. Prior to hospital admission she was on Tosemide 80 MG daily and metolazone 2.5 mg one weekly. Her BP on the above regimen ranges from 120/70 to 150/80.  Upon hospital admission she was discharged home on Torsemide 80 mg daily and Triamterene/HCTZ 37.5/25 mg daily. She will be on 3 antihypertensive medication regimen daily per hospital discharge recommendation. As discussed with her I am concern about hypotension. Also concern for hyperkalemia with increased dose of potassium to 40 meq/L and Maxzide although HCTZ should cause hypokalemia, Triamterene will cause hyperkalemia.   She was advised to hold Maxzide till next appointment however she stated she held her Maxzide for 3 days now and her legs are all swollen despite taking Torsemide 80 mg qd. Patient prefer to be back on Maxzide. Risk of medication with Kdur 18meq/l discussed as well as potential for hypotension. She is instructed to monitor BP daily and take Maxzide every other day instead. She has follow up with Dr Minda Ditto Dec 11, I recommend BP check and K+ check during that visit. I will keep close eye on her BP and K+ She agreed with plan.

## 2014-11-18 NOTE — Telephone Encounter (Signed)
Shanon Brow, Physical Therapist with [facility] called to request a verbal order to continue physical therapy 2 times a week for 8 weeks.  Please give him a call at [phone].  Derl Barrow, RN

## 2014-11-18 NOTE — Telephone Encounter (Signed)
I agree with continue PT 2 times a week for 8 weeks, please call [facility] to let them know of my approval.   Expand All Collapse All   Shanon Brow, Physical Therapist with [facility] called to request a verbal order to continue physical therapy 2 times a week for 8

## 2014-11-18 NOTE — Telephone Encounter (Signed)
Referral placed but it pending approval from silverback.  Deferred referral for one week in epic so we can recheck next week.  This is also documented under the referral so [facility] is aware too. [name],CMA

## 2014-11-18 NOTE — Telephone Encounter (Signed)
LM for Kathleen Roy with [facility]. Kathleen Roy

## 2014-11-20 NOTE — Telephone Encounter (Signed)
I will be sure to review these things at her visit on the 11th. Thanks Dr. Gwendlyn Deutscher  CGM

## 2014-11-20 NOTE — Addendum Note (Signed)
Addended by: [name] on: 11/20/2014 08:22 AM   Modules accepted: Orders

## 2014-11-22 ENCOUNTER — Telehealth: Payer: Self-pay | Admitting: *Deleted

## 2014-11-22 NOTE — Telephone Encounter (Signed)
Shanon Brow, Physical Therapist with [facility] called stating that pt called him and discontinued all physical therapy visits.  He did not have a chance to do a follow up visit with pt.  At the last visit he wanted pt to try different exercises/movements but she told him that they were to painful.  If you have questions please give him a call at [phone].  Derl Barrow, RN

## 2014-11-25 ENCOUNTER — Encounter: Payer: Self-pay | Admitting: Family Medicine

## 2014-11-25 ENCOUNTER — Ambulatory Visit (INDEPENDENT_AMBULATORY_CARE_PROVIDER_SITE_OTHER): Payer: Commercial Managed Care - HMO | Admitting: Family Medicine

## 2014-11-25 VITALS — BP 173/84 | HR 90 | Temp 98.6°F | Ht 62.0 in | Wt 360.0 lb

## 2014-11-25 DIAGNOSIS — E876 Hypokalemia: Secondary | ICD-10-CM

## 2014-11-25 DIAGNOSIS — J9611 Chronic respiratory failure with hypoxia: Secondary | ICD-10-CM

## 2014-11-25 DIAGNOSIS — M549 Dorsalgia, unspecified: Secondary | ICD-10-CM

## 2014-11-25 DIAGNOSIS — G8929 Other chronic pain: Secondary | ICD-10-CM

## 2014-11-25 DIAGNOSIS — I10 Essential (primary) hypertension: Secondary | ICD-10-CM

## 2014-11-25 LAB — BASIC METABOLIC PANEL
BUN: 19 mg/dL (ref 6–23)
CO2: 34 mEq/L — ABNORMAL HIGH (ref 19–32)
CREATININE: 0.78 mg/dL (ref 0.50–1.10)
Calcium: 10.2 mg/dL (ref 8.4–10.5)
Chloride: 92 mEq/L — ABNORMAL LOW (ref 96–112)
Glucose, Bld: 70 mg/dL (ref 70–99)
POTASSIUM: 4 meq/L (ref 3.5–5.3)
Sodium: 141 mEq/L (ref 135–145)

## 2014-11-25 MED ORDER — OXYCODONE-ACETAMINOPHEN 5-325 MG PO TABS: 1.0000 | ORAL_TABLET | Freq: Four times a day (QID) | ORAL | Status: DC | PRN

## 2014-11-25 MED ORDER — TRIAMTERENE-HCTZ 37.5-25 MG PO TABS: 1.0000 | ORAL_TABLET | Freq: Every day | ORAL | Status: DC

## 2014-11-25 NOTE — Progress Notes (Signed)
Patient dropped off form for [employer] to be completed.  Please call when ready to be picked up.

## 2014-11-25 NOTE — Patient Instructions (Signed)
Thanks for coming in today.   Please take your oxycodone prescription as prescribed. It is very dangerous with your respiratory disease to take more than prescribed.   Restart your Maxzide daily for your blood pressure.   Check your blood pressure periodically prior to your next visit.   Return for a visit with Dr. Gwendlyn Deutscher within 1 month.   Call [name]  To get your CPAP settings changed. [phone]  Thanks for letting us take care of you.   Sincerely,  Paula Compton, MD Family Medicine - PGY 1

## 2014-11-25 NOTE — Assessment & Plan Note (Signed)
A: HTN 412 systolic with repeat of 820 today.   P:  - restarted on Maxzide daily.

## 2014-11-25 NOTE — Progress Notes (Signed)
Patient ID: Kathleen Roy, female   DOB: 06/27/1961, 53 y.o.   MRN: [id number]   [facility] [name], MD Phone: [phone]  Subjective:   # Hospital Follow up  - Pt. Here for hospital follow up for respiratory failure and hypopotassemia.  - Doing well on home O2. O2 ambulating her in the clinic was 97% with O2 [place] in place.  - Compliant with CPAP at home, but complains of settings. Planning to call [name] to change settings.  - Taking Demadex and K as instructed.  - Using 2 5mg  Oxycodone twice daily for pain. Reports no respiratory symptoms with this.  - Otherwise, is feeling much better since leaving the hospital, and reports that she does not feel that she has had fluid gain.   All relevant systems were reviewed and were negative unless otherwise noted in the HPI  Past Medical History Reviewed problem list.  Medications- reviewed and updated Current Outpatient Prescriptions  Medication Sig Dispense Refill  . albuterol (PROVENTIL HFA;VENTOLIN HFA) 108 (90 BASE) MCG/ACT inhaler Inhale 2 puffs into the lungs every 6 (six) hours as needed for wheezing or shortness of breath. (Patient not taking: Reported on 11/08/2014) 18 g 3  . ARIPiprazole (ABILIFY) 2 MG tablet Take 1 tablet (2 mg total) by mouth daily. (Patient taking differently: Take 2 mg by mouth every other day. ) 30 tablet 3  . Ascorbic Acid (VITAMIN C) 1000 MG tablet Take 3,000 mg by mouth daily.    Marland Kitchen nystatin (MYCOSTATIN) powder Apply topically 2 (two) times daily. 30 g 3  . Omega-3 Fatty Acids (FISH OIL) 1000 MG CAPS Take 2 capsules by mouth daily.    Marland Kitchen oxyCODONE-acetaminophen (PERCOCET/ROXICET) 5-325 MG per tablet Take 1 tablet by mouth every 6 (six) hours as needed for moderate pain or severe pain. 120 tablet 0  . polyethylene glycol (MIRALAX / GLYCOLAX) packet Take 17 g by mouth daily.    . potassium chloride SA (KLOR-CON M20) 20 MEQ tablet Take 2 tablets (40 mEq total) by mouth daily. 60  tablet 0  . pravastatin (PRAVACHOL) 40 MG tablet Take 1 tablet (40 mg total) by mouth at bedtime. 90 tablet 1  . pregabalin (LYRICA) 150 MG capsule Take 1 capsule (150 mg total) by mouth 2 (two) times daily. 60 capsule 2  . torsemide (DEMADEX) 20 MG tablet Take 4 tablets (80 mg total) by mouth daily. 120 tablet 2  . trazodone (DESYREL) 300 MG tablet Take 300 mg by mouth at bedtime. ([place])    . triamterene-hydrochlorothiazide (MAXZIDE-25) 37.5-25 MG per tablet Take 1 tablet by mouth daily. 30 tablet 0  . Venlafaxine HCl 225 MG TB24 Take 1 tablet (225 mg total) by mouth daily. 90 tablet 2   No current facility-administered medications for this visit.   Chief complaint-noted No additions to family history Social history- patient is a current smoker  Objective: BP 173/84 mmHg  Pulse 90  Temp(Src) 98.6 F (37 C) (Oral)  Ht 5\' 2"  (1.575 m)  Wt 360 lb (163.295 kg)  BMI 65.83 kg/m2  SpO2 97% Gen: NAD, alert, cooperative with exam, Morbidly obese, O2NC in place.  HEENT: NCAT, EOMI, PERRL Neck: FROM, supple CV: RRR, good S1/S2, no murmur Resp: CTABL, no wheezes, non-labored Abd: SNTND, BS present, no guarding or organomegaly Ext: 3+ edema to the knee, warm, normal tone, moves UE/LE spontaneously, ambulates with cane Neuro: Alert and oriented, No gross deficits Skin: no rashes no lesions  Assessment/Plan:  See problem based a/p

## 2014-11-25 NOTE — Assessment & Plan Note (Signed)
A: Doing well today O2 sat 97% after ambulating with O2NC in place. Compliant with this at home. Compliant with CPAP though wants to get [name] to change settings so she is more comfortable. Taking Oxycodone at home as prescribed. Though we had a long discussion about the danger of this in respiratory failure. She acknowledges understanding, and says that she will be careful.   P:  - Continue Home O2 - Continue CPAP, patient to call [name] about settings.  - Continue Demadex for fluid.

## 2014-11-25 NOTE — Assessment & Plan Note (Signed)
A: Pt. With hypokalemia of 2.9 upon discharge from hospital. Taking k at home as prescribed. Taking Demadex as prescribed. No muscle cramps.   P:  - Recheck Bmet today to check K level.  - Will adjust K dose as indicated.

## 2014-11-26 NOTE — Progress Notes (Signed)
Reviewed

## 2014-11-28 NOTE — Progress Notes (Signed)
Envelope was sealed.  Placed in provider's box for completion.  Latasha Puskas,CMA

## 2014-11-29 ENCOUNTER — Telehealth: Payer: Self-pay | Admitting: Family Medicine

## 2014-11-29 ENCOUNTER — Ambulatory Visit: Payer: Commercial Managed Care - HMO | Admitting: Neurology

## 2014-11-30 NOTE — Progress Notes (Signed)
Form completed and given to Tamika today 11/30/14. Need to be faxed today to [facility].

## 2014-11-30 NOTE — Progress Notes (Signed)
Pt informed that form is complete and ready for pick up.  Derl Barrow, RN

## 2014-12-01 ENCOUNTER — Other Ambulatory Visit: Payer: Self-pay | Admitting: *Deleted

## 2014-12-01 MED ORDER — ARIPIPRAZOLE 2 MG PO TABS: 2.0000 mg | ORAL_TABLET | ORAL | Status: DC

## 2014-12-01 NOTE — Telephone Encounter (Signed)
I got a refill request for Abilify, patient has been taking 2 mg every other day due to tremors which could be a side effect of Abilify. She stated her tremor has improved some and her mood is stable, she will like to continue Abilify 2mg  every other day.

## 2014-12-05 ENCOUNTER — Institutional Professional Consult (permissible substitution): Payer: Commercial Managed Care - HMO | Admitting: Pulmonary Disease

## 2014-12-07 ENCOUNTER — Other Ambulatory Visit: Payer: Self-pay | Admitting: Family Medicine

## 2014-12-12 ENCOUNTER — Ambulatory Visit
Admission: RE | Admit: 2014-12-12 | Discharge: 2014-12-12 | Disposition: A | Discharge status: Home / Self Care | Payer: Commercial Managed Care - HMO | Source: Ambulatory Visit | Attending: Neurology | Admitting: Neurology

## 2014-12-12 DIAGNOSIS — G629 Polyneuropathy, unspecified: Secondary | ICD-10-CM

## 2014-12-12 DIAGNOSIS — R251 Tremor, unspecified: Secondary | ICD-10-CM

## 2014-12-12 DIAGNOSIS — R413 Other amnesia: Secondary | ICD-10-CM

## 2014-12-14 ENCOUNTER — Other Ambulatory Visit: Payer: Self-pay | Admitting: Family Medicine

## 2014-12-14 ENCOUNTER — Other Ambulatory Visit: Payer: Self-pay | Admitting: *Deleted

## 2014-12-14 ENCOUNTER — Telehealth: Payer: Self-pay | Admitting: Neurology

## 2014-12-14 DIAGNOSIS — I10 Essential (primary) hypertension: Secondary | ICD-10-CM

## 2014-12-14 MED ORDER — TRIAMTERENE-HCTZ 37.5-25 MG PO TABS: 1.0000 | ORAL_TABLET | Freq: Every day | ORAL | Status: DC

## 2014-12-14 MED ORDER — POTASSIUM CHLORIDE CRYS ER 20 MEQ PO TBCR: 40.0000 meq | EXTENDED_RELEASE_TABLET | Freq: Every day | ORAL | Status: DC

## 2014-12-14 MED ORDER — PREGABALIN 150 MG PO CAPS: 150.0000 mg | ORAL_CAPSULE | Freq: Two times a day (BID) | ORAL | Status: DC

## 2014-12-14 NOTE — Telephone Encounter (Signed)
Medication was resent to pharmacy through the computer. Szymon Foiles,CMA

## 2014-12-14 NOTE — Addendum Note (Signed)
Addended by: Valerie Roys on: 12/14/2014 03:45 PM   Modules accepted: Orders

## 2014-12-14 NOTE — Telephone Encounter (Signed)
I called patient. MRI the brain is relatively unremarkable, nothing to explain troubles with memory or right-sided numbness.   MRI brain 12/13/2014:  IMPRESSION:  Equivocal MRI brain (without) demonstrating: 1. Few non-specific punctate foci of periventricular and subcortical gliosis. 2. No acute findings.

## 2014-12-14 NOTE — Telephone Encounter (Signed)
Pt is checking status of rx for triamterene, says she took her last one today. Pt goes to [facility]/[address]

## 2014-12-22 ENCOUNTER — Ambulatory Visit: Payer: Commercial Managed Care - HMO | Admitting: Neurology

## 2014-12-23 ENCOUNTER — Other Ambulatory Visit: Payer: Self-pay | Admitting: Family Medicine

## 2014-12-23 DIAGNOSIS — M549 Dorsalgia, unspecified: Principal | ICD-10-CM

## 2014-12-23 DIAGNOSIS — G8929 Other chronic pain: Secondary | ICD-10-CM

## 2014-12-23 MED ORDER — OXYCODONE-ACETAMINOPHEN 5-325 MG PO TABS: 1.0000 | ORAL_TABLET | Freq: Four times a day (QID) | ORAL | Status: DC | PRN

## 2014-12-23 NOTE — Telephone Encounter (Signed)
Mr Radovich here to pick up sister's prescription

## 2014-12-27 ENCOUNTER — Ambulatory Visit: Payer: Commercial Managed Care - HMO | Admitting: Family Medicine

## 2014-12-28 ENCOUNTER — Ambulatory Visit: Payer: Commercial Managed Care - HMO | Admitting: Podiatry

## 2015-01-03 ENCOUNTER — Ambulatory Visit: Payer: Commercial Managed Care - HMO | Admitting: Family Medicine

## 2015-01-04 ENCOUNTER — Ambulatory Visit: Payer: Commercial Managed Care - HMO | Admitting: Podiatry

## 2015-01-06 ENCOUNTER — Ambulatory Visit: Payer: Commercial Managed Care - HMO | Admitting: Family Medicine

## 2015-01-11 ENCOUNTER — Institutional Professional Consult (permissible substitution): Payer: Commercial Managed Care - HMO | Admitting: Pulmonary Disease

## 2015-01-17 ENCOUNTER — Ambulatory Visit: Payer: Commercial Managed Care - HMO | Admitting: Family Medicine

## 2015-01-18 ENCOUNTER — Ambulatory Visit: Payer: Commercial Managed Care - HMO | Admitting: Podiatry

## 2015-01-20 ENCOUNTER — Ambulatory Visit (INDEPENDENT_AMBULATORY_CARE_PROVIDER_SITE_OTHER): Payer: Commercial Managed Care - HMO | Admitting: Family Medicine

## 2015-01-20 ENCOUNTER — Encounter: Payer: Self-pay | Admitting: Family Medicine

## 2015-01-20 ENCOUNTER — Telehealth: Payer: Self-pay | Admitting: Family Medicine

## 2015-01-20 VITALS — BP 150/90 | HR 109 | Temp 97.9°F | Ht 62.0 in | Wt 360.0 lb

## 2015-01-20 DIAGNOSIS — I10 Essential (primary) hypertension: Secondary | ICD-10-CM

## 2015-01-20 DIAGNOSIS — R238 Other skin changes: Secondary | ICD-10-CM

## 2015-01-20 DIAGNOSIS — E876 Hypokalemia: Secondary | ICD-10-CM

## 2015-01-20 DIAGNOSIS — M25569 Pain in unspecified knee: Secondary | ICD-10-CM

## 2015-01-20 DIAGNOSIS — R609 Edema, unspecified: Secondary | ICD-10-CM

## 2015-01-20 DIAGNOSIS — L989 Disorder of the skin and subcutaneous tissue, unspecified: Secondary | ICD-10-CM

## 2015-01-20 LAB — BASIC METABOLIC PANEL
BUN: 20 mg/dL (ref 6–23)
CALCIUM: 9.9 mg/dL (ref 8.4–10.5)
CO2: 39 mEq/L — ABNORMAL HIGH (ref 19–32)
Chloride: 91 mEq/L — ABNORMAL LOW (ref 96–112)
Creat: 0.74 mg/dL (ref 0.50–1.10)
GLUCOSE: 160 mg/dL — AB (ref 70–99)
POTASSIUM: 3.5 meq/L (ref 3.5–5.3)
Sodium: 140 mEq/L (ref 135–145)

## 2015-01-20 MED ORDER — MUPIROCIN 2 % EX OINT: TOPICAL_OINTMENT | CUTANEOUS | Status: DC

## 2015-01-20 MED ORDER — ALBUTEROL SULFATE HFA 108 (90 BASE) MCG/ACT IN AERS: 2.0000 | INHALATION_SPRAY | Freq: Four times a day (QID) | RESPIRATORY_TRACT | Status: DC | PRN

## 2015-01-20 NOTE — Assessment & Plan Note (Signed)
Right posterior thigh. Looks like developing decubitus ulcer. Patient advised to avoid resting on this thigh for a prolonged period. Try to get up and walk often it possible. Keep area clean and dry. Topical A/B prescribed.

## 2015-01-20 NOTE — Patient Instructions (Signed)
Edema  Edema is an abnormal buildup of fluids. It is more common in your legs and thighs. Painless swelling of the feet and ankles is more likely as a person ages. It also is common in looser skin, like around your eyes.  HOME CARE   · Keep the affected body part above the level of the heart while lying down.  · Do not sit still or stand for a long time.  · Do not put anything right under your knees when you lie down.  · Do not wear tight clothes on your upper legs.  · Exercise your legs to help the puffiness (swelling) go down.  · Wear elastic bandages or support stockings as told by your doctor.  · A low-salt diet may help lessen the puffiness.  · Only take medicine as told by your doctor.  GET HELP IF:  · Treatment is not working.  · You have heart, liver, or kidney disease and notice that your skin looks puffy or shiny.  · You have puffiness in your legs that does not get better when you raise your legs.  · You have sudden weight gain for no reason.  GET HELP RIGHT AWAY IF:   · You have shortness of breath or chest pain.  · You cannot breathe when you lie down.  · You have pain, redness, or warmth in the areas that are puffy.  · You have heart, liver, or kidney disease and get edema all of a sudden.  · You have a fever and your symptoms get worse all of a sudden.  MAKE SURE YOU:   · Understand these instructions.  · Will watch your condition.  · Will get help right away if you are not doing well or get worse.  Document Released: 05/20/2008 Document Revised: 12/07/2013 Document Reviewed: 09/24/2013  ExitCare® Patient Information ©2015 [employer]. This information is not intended to replace advice given to you by your health care provider. Make sure you discuss any questions you have with your health care provider.

## 2015-01-20 NOTE — Assessment & Plan Note (Signed)
BP slightly elevated today. She is yet to take her medication. Rechecked BP improved. I will monitor for now.

## 2015-01-20 NOTE — Telephone Encounter (Signed)
Following up on the rxs that were to be called in for her inhaler and leg.  Please advise if these have been sent.

## 2015-01-20 NOTE — Addendum Note (Signed)
Addended by: Andrena Mews T on: 01/20/2015 03:45 PM   Modules accepted: Orders

## 2015-01-20 NOTE — Assessment & Plan Note (Signed)
Check Bmet today.

## 2015-01-20 NOTE — Progress Notes (Signed)
Subjective:     Patient ID: Kathleen Roy, female   DOB: [birth year], 54 y.o.   MRN: [id number]  HPI  B/L Knee pain:Patient still having issues with her knees, pain is worse with ambulation, she cannot stand on her knee longer than few minutes. Her percocet is helping a lot with her pain. HTN/Hypokalemia:She is compliant with Maxzide, yet to take her medication today. Also on Kdur 40 mg qd, here for follow up. Leg swelling:Patient stated since she was taken off her Metolazone during her last hospital admission, her feet swelling has progressively worsened, she will like to get back on this medication. Thigh irritation:C/O irritation of her right thigh on the back which started few days ago, this causes irritation especially whenever she sits on it for a prolonged period. She denies any known injury or trauma to her thigh.  Current Outpatient Prescriptions on File Prior to Visit  Medication Sig Dispense Refill  . ARIPiprazole (ABILIFY) 2 MG tablet Take 1 tablet (2 mg total) by mouth every other day. 15 tablet 3  . Ascorbic Acid (VITAMIN C) 1000 MG tablet Take 3,000 mg by mouth daily.    Marland Kitchen nystatin (MYCOSTATIN) powder Apply topically 2 (two) times daily. 30 g 3  . Omega-3 Fatty Acids (FISH OIL) 1000 MG CAPS Take 2 capsules by mouth daily.    Marland Kitchen oxyCODONE-acetaminophen (PERCOCET/ROXICET) 5-325 MG per tablet Take 1 tablet by mouth every 6 (six) hours as needed for moderate pain or severe pain. 120 tablet 0  . polyethylene glycol (MIRALAX / GLYCOLAX) packet Take 17 g by mouth daily.    . potassium chloride SA (KLOR-CON M20) 20 MEQ tablet Take 2 tablets (40 mEq total) by mouth daily. 60 tablet 2  . pravastatin (PRAVACHOL) 40 MG tablet Take 1 tablet (40 mg total) by mouth at bedtime. 90 tablet 1  . pregabalin (LYRICA) 150 MG capsule Take 1 capsule (150 mg total) by mouth 2 (two) times daily. 60 capsule 2  . torsemide (DEMADEX) 20 MG tablet Take 4 tablets (80 mg total) by mouth daily. 120 tablet 2  .  trazodone (DESYREL) 300 MG tablet Take 300 mg by mouth at bedtime. ([place])    . triamterene-hydrochlorothiazide (MAXZIDE-25) 37.5-25 MG per tablet Take 1 tablet by mouth daily. 30 tablet 3  . Venlafaxine HCl 225 MG TB24 Take 1 tablet (225 mg total) by mouth daily. 90 tablet 2  . albuterol (PROVENTIL HFA;VENTOLIN HFA) 108 (90 BASE) MCG/ACT inhaler Inhale 2 puffs into the lungs every 6 (six) hours as needed for wheezing or shortness of breath. (Patient not taking: Reported on 11/08/2014) 18 g 3   No current facility-administered medications on file prior to visit.   Past Medical History  Diagnosis Date  . Normal echocardiogram 05/30/05    suboptimal study  . Obesity, morbid (more than 100 lbs over ideal weight or BMI > 40)     obese since childhood  . Fibromyalgia   . Hyperlipidemia   . Sepsis   . Depression   . Neuropathy   . Peripheral edema   . Memory disturbance   . Sleep apnea     "recently dx'd; haven't got my equipment yet" (11/08/2014)  . Headache     "at least weekly; it's usually when I first wake up" (11/08/2014)  . Migraines 1970's - <2000    "they just went away"  . Osteoarthritis   . Arthritis     "I feel like it's everywhere" (11/08/2014)  . Chronic lower  back pain      Review of Systems  Respiratory: Negative.   Cardiovascular: Negative.   Gastrointestinal: Negative.   Musculoskeletal:       Leg swelling  Skin:       Place on her thigh   All other systems reviewed and are negative.  Filed Vitals:   01/20/15 1108 01/20/15 1136  BP: 164/92 150/90  Pulse: 109   Temp: 97.9 F (36.6 C)   TempSrc: Oral   Height: 5\' 2"  (1.575 m)   Weight: 360 lb (163.295 kg)        Objective:   Physical Exam  Constitutional: She is oriented to person, place, and time. She appears well-developed. No distress.  Cardiovascular: Normal rate, regular rhythm, normal heart sounds and intact distal pulses.   No murmur heard. Pulmonary/Chest: Effort normal and  breath sounds normal. No respiratory distress. She has no wheezes. She exhibits no tenderness.  Abdominal: Soft. Bowel sounds are normal. She exhibits no distension and no mass. There is no tenderness.  Musculoskeletal: She exhibits edema.  B/L non-pitting edema  Neurological: She is alert and oriented to person, place, and time.  Skin:     Nursing note and vitals reviewed.      Assessment:     B/L Knee pain: HTNHypokalemia: Leg swelling: Thigh irritation:    Plan:     Check problem list.

## 2015-01-20 NOTE — Telephone Encounter (Signed)
Will forward to MD to check on status of prescriptions. Amadea Keagy,CMA

## 2015-01-20 NOTE — Assessment & Plan Note (Signed)
Stable on percocet. Weight loss will her improve symptoms tremendously. We will continue to work with her on this. Refill percocet when due.

## 2015-01-20 NOTE — Telephone Encounter (Signed)
I have taken care of this, patient is aware.

## 2015-01-20 NOTE — Assessment & Plan Note (Addendum)
Gradually worsening swelling. Check Bmet today if K+ is ok I will place her back on weekly Metolazone. Continue Torsemide. Obtain compression stocking ( Size will be an issue). F/U prn.

## 2015-01-23 ENCOUNTER — Other Ambulatory Visit: Payer: Self-pay | Admitting: Family Medicine

## 2015-01-23 MED ORDER — METOLAZONE 2.5 MG PO TABS: 2.5000 mg | ORAL_TABLET | ORAL | Status: DC

## 2015-01-23 NOTE — Telephone Encounter (Signed)
Message left for patient. K+ within normal range but will need close monitoring. I will restart her back on Metolazone 2.5mg  weekly for leg swelling. This has helped her in the past.

## 2015-01-24 ENCOUNTER — Other Ambulatory Visit: Payer: Self-pay | Admitting: Family Medicine

## 2015-01-24 DIAGNOSIS — G8929 Other chronic pain: Secondary | ICD-10-CM

## 2015-01-24 DIAGNOSIS — M549 Dorsalgia, unspecified: Principal | ICD-10-CM

## 2015-01-24 MED ORDER — OXYCODONE-ACETAMINOPHEN 5-325 MG PO TABS: 1.0000 | ORAL_TABLET | Freq: Four times a day (QID) | ORAL | Status: DC | PRN

## 2015-01-24 NOTE — Telephone Encounter (Signed)
Pt is aware and states that her brother will pick this up on Thursday. [name],CMA

## 2015-01-24 NOTE — Telephone Encounter (Signed)
Pt called and wanted the doctor to know that in two days she will need a refill on her Oxycodone. jw

## 2015-02-01 ENCOUNTER — Ambulatory Visit (INDEPENDENT_AMBULATORY_CARE_PROVIDER_SITE_OTHER): Payer: Commercial Managed Care - HMO | Admitting: Podiatry

## 2015-02-01 ENCOUNTER — Encounter: Payer: Self-pay | Admitting: Podiatry

## 2015-02-01 VITALS — BP 109/70 | HR 90 | Resp 18

## 2015-02-01 DIAGNOSIS — R52 Pain, unspecified: Secondary | ICD-10-CM

## 2015-02-01 DIAGNOSIS — L84 Corns and callosities: Secondary | ICD-10-CM

## 2015-02-01 DIAGNOSIS — L03116 Cellulitis of left lower limb: Secondary | ICD-10-CM

## 2015-02-01 MED ORDER — CEPHALEXIN 500 MG PO CAPS: 500.0000 mg | ORAL_CAPSULE | Freq: Three times a day (TID) | ORAL | Status: DC

## 2015-02-01 MED ORDER — SILVER SULFADIAZINE 1 % EX CREA: 1.0000 "application " | TOPICAL_CREAM | Freq: Every day | CUTANEOUS | Status: DC

## 2015-02-01 NOTE — Progress Notes (Signed)
   Subjective:    Patient ID: Kathleen Roy, female    DOB: 10-03-1961, 54 y.o.   MRN: [id number]  HPI  54 year old female presents the office today with her brother for evaluation of bilateral foot pain. She states that she is mostly nonambulatory however she does stand for transitions. She states that she has chronic lower extremity edema. She has pain to both of her heels. She states that she is unable to look at the heels herself and she is unsure of what going on. She denies any systemic complaints as fevers, chills, nausea, vomiting. No other complaints at this time.   Review of Systems  Respiratory:       Oxygen 24/7  Cardiovascular: Positive for leg swelling.       Objective:   Physical Exam AAO 3, NAD DP/PT pulses palpable, CRT less than 3 seconds Protective sensation decreased with Simms Weinstein monofilament, vibratory sensation intact, Achilles tendon reflex intact. Chronic lower extremity edema with faint redness to bilateral lower extremities. Subjectively, the patient states her legs are of normal color for her and have not changed . There are small, superficial lesions on the anterior aspect of the right distal leg, likely from chronic venous insuffiencey. There is no drainage or purulence expressed. There is no ascending cellulitis. There is no areas of fluctuance or crepitus or malodor. On the left posterior heel there is evidence of a partially detached deroofed blister-like appearing lesions in which the surrounding skin edges have callused. There is erythema around this area wishes appear to be more intense and the remaining aspect of the lower extremities. The rim of erythema extends and 1 cm from around the area. Upon debridement of the hyperkeratotic tissue there is no purulence or drainage expressed. There is no probing, undermining, tunneling. There is no fluctuance or crepitus. No malodor. On the right foot along the plantar heel there is evidence of pre-ulcerative  lesions which are tender to palpation. There is no definitive open lesion on the right lower extremity on the foot. No other open lesions or pre-ulcer lesions identified bilaterally. No other areas of tenderness bilateral lower extremity. No pain with calf compression, swelling, warmth, erythema.      Assessment & Plan:  54 year old female with bilateral. 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 -On the left side though area was debrided without complications. Recommended continue daily dressing changes with Silvadene and a dry sterile dressing. Ordered home health aide to come to her house for dressing changes. Prescribed Keflex. Monitoring clinical signs or symptoms of an worsening infection and directed to call the office immediately should any occur or go directly to the emergency room. Dispensed Multi-Podus boot. -On the left thigh continue to monitor the area for any skin breakdown. It appears as this side she is putting more per weight down upon transitioning and this is resulting in the pre-ulcer lesions of the plantar right heel. -Follow-up in 2 weeks or sooner if any problems are to arise. I would like to see her in 1 week Or she is unable to do so. In the meantime, encouraged to call the office with any questions, concerns, changes symptoms.

## 2015-02-01 NOTE — Patient Instructions (Signed)
Daily dressing changes as discussed. Monitor for any signs/symptoms of infection. Call the office immediately if any occur or go directly to the emergency room. Call with any questions/concerns.

## 2015-02-02 ENCOUNTER — Telehealth: Payer: Self-pay | Admitting: Family Medicine

## 2015-02-02 ENCOUNTER — Other Ambulatory Visit: Payer: Self-pay | Admitting: Family Medicine

## 2015-02-02 DIAGNOSIS — M25561 Pain in right knee: Secondary | ICD-10-CM

## 2015-02-02 DIAGNOSIS — M25562 Pain in left knee: Principal | ICD-10-CM

## 2015-02-02 NOTE — Progress Notes (Signed)
Pt has bedside commode. She needs another one because it has cracks in it and it is eating up her leg. She had one delivered from [place] just a few months ago but it was too wide and they wouldn't take it take She wants one from [facility]. When putting in a request, need to specify her weight and cannot be wider than 27 inches and 18 1/2 inches deep

## 2015-02-02 NOTE — Telephone Encounter (Signed)
Pt has bedside commode. She needs another one because it has cracks in it and it is eating up her leg. She had one delivered from [place] just a few months ago but it was too wide and they wouldn't take it take She wants one from [place]. When putting in a request, need to specify her weight and cannot be wider than 27 inches and 18 1/2 inches deep

## 2015-02-02 NOTE — Telephone Encounter (Signed)
Script placed up front to be faxed to [facility]. The last time I check her insurance is not in network with [facility], but at her request script for commode has been placed for faxing.

## 2015-02-02 NOTE — Telephone Encounter (Signed)
Will forward to MD. [name],CMA  

## 2015-02-03 ENCOUNTER — Telehealth: Payer: Self-pay | Admitting: *Deleted

## 2015-02-03 NOTE — Telephone Encounter (Addendum)
Pt states she was referred to [facility] by Dr. Jacqualyn Posey 02/172016 for dressing changes but no one has contacted her to set up appts.  Orders for home heath care and required pt demographic, insurance and Dr. evaluation faxed to [phone].

## 2015-02-03 NOTE — Progress Notes (Signed)
Spoke with [facility] and they need a face to face note with this patient, her demographics, diagnosis (COPD) and Height and Weight.  Order reprinted and faxed to [phone]. Zeniah Briney,CMA

## 2015-02-03 NOTE — Progress Notes (Signed)
Request needs to go to [facility] medical supply Phone  [phone] They only have one left and would like to have this faxed today

## 2015-02-06 ENCOUNTER — Telehealth: Payer: Self-pay | Admitting: Family Medicine

## 2015-02-06 NOTE — Telephone Encounter (Signed)
Refaxed orders. [name],CMA

## 2015-02-06 NOTE — Telephone Encounter (Signed)
Pt called back because of the mis-understanding of where to fax her request for a new commode. This needs to be faxed to [facility]. jw

## 2015-02-08 ENCOUNTER — Ambulatory Visit (INDEPENDENT_AMBULATORY_CARE_PROVIDER_SITE_OTHER): Payer: Commercial Managed Care - HMO | Admitting: Pulmonary Disease

## 2015-02-08 ENCOUNTER — Encounter: Payer: Self-pay | Admitting: Pulmonary Disease

## 2015-02-08 VITALS — BP 126/76 | HR 87 | Temp 98.1°F | Ht 62.0 in | Wt 364.4 lb

## 2015-02-08 DIAGNOSIS — G4733 Obstructive sleep apnea (adult) (pediatric): Secondary | ICD-10-CM

## 2015-02-08 NOTE — Progress Notes (Signed)
   Subjective:    Patient ID: Kathleen Roy, female    DOB: [birth year], 54 y.o.   MRN: [id number]  HPI The patient is a 54 year old female who I've been asked to see for management of obstructive sleep apnea. She underwent a sleep study in November of last year, which showed mild to moderate OSA. She was found to have an AHI of 18 events per hour, and was titrated on bilevel to a final pressure of 21/14. She has been started on a bilevel device, but his had great difficulty with tolerance. Part of this is related to mask fit, and she feels the flapping during the night from leaking keeps her awake. She also is having difficulties tolerating the very high pressure level. She does not feel the bilevel is helping her sleep because of these 2 issues, and she continues to have significant daytime sleepiness. Her Epworth score today is 13.   Review of Systems  Constitutional: Negative for fever and unexpected weight change.  HENT: Negative for congestion, dental problem, ear pain, nosebleeds, postnasal drip, rhinorrhea, sinus pressure, sneezing, sore throat and trouble swallowing.   Eyes: Negative for redness and itching.  Respiratory: Positive for cough and shortness of breath. Negative for chest tightness and wheezing.   Cardiovascular: Positive for leg swelling. Negative for palpitations.  Gastrointestinal: Negative for nausea and vomiting.  Genitourinary: Negative for dysuria.  Musculoskeletal: Negative for joint swelling.  Skin: Negative for rash.  Neurological: Negative for headaches.  Hematological: Does not bruise/bleed easily.  Psychiatric/Behavioral: Negative for dysphoric mood. The patient is not nervous/anxious.        Objective:   Physical Exam Constitutional:  Morbidly obese female, no acute distress  HENT:  Nares patent without discharge  Oropharynx without exudate, palate and uvula are thick and elongated  Eyes:  Perrla, eomi, no scleral icterus  Neck:  No JVD, no  TMG  Cardiovascular:  Normal rate, regular rhythm, no rubs or gallops.  No murmurs        Unable to palpate distal pulses  Pulmonary :  Normal breath sounds, no stridor or respiratory distress   No rales, rhonchi, or wheezing  Abdominal:  Soft, nondistended, bowel sounds present.  No tenderness noted.   Musculoskeletal: severe lower extremity edema noted, +venous stasis changes  Lymph Nodes:  No cervical lymphadenopathy noted  Skin:  No cyanosis noted  Neurologic:  Alert, appropriate, moves all 4 extremities without obvious deficit.         Assessment & Plan:

## 2015-02-08 NOTE — Assessment & Plan Note (Signed)
The patient has a history of mild to moderate obstructive sleep apnea by her recent sleep study, but is having significant difficulty tolerating her bilevel device. Part of the issue is her significantly high pressure, and it has been my experience that rarely patients tolerate this level of pressure from day one.  She also has a significant issue with mask fit, and this may simply be related to her high pressure. I would much rather treat 75% of her sleep apnea with a lower pressure, than 0%  with total noncompliance.  I will decrease her pressure to 18/14, and also have them refit her mask for improved seal. We can check her download going forward to make sure that her sleep apnea is under reasonable control. I have also counseled her on aggressive weight loss, especially since she is requiring very high pressure levels that are at the limit of today's positive pressure devices. The next step would be a tracheostomy with a home ventilator at night only.

## 2015-02-08 NOTE — Patient Instructions (Signed)
Will have your homecare company decrease your bilevel pressure to 18/14 and work with you on mask fit. Work on weight loss followup with me again in 20mos, but call if you continue to have issues with mask fit or pressure.

## 2015-02-09 ENCOUNTER — Other Ambulatory Visit: Payer: Self-pay | Admitting: *Deleted

## 2015-02-09 MED ORDER — MUPIROCIN 2 % EX OINT: TOPICAL_OINTMENT | CUTANEOUS | Status: DC

## 2015-02-13 ENCOUNTER — Other Ambulatory Visit: Payer: Self-pay | Admitting: Family Medicine

## 2015-02-13 MED ORDER — NYSTATIN 100000 UNIT/GM EX POWD: Freq: Two times a day (BID) | CUTANEOUS | Status: DC

## 2015-02-13 NOTE — Telephone Encounter (Signed)
Needs 4 botttles of ny stop powder These bottles have 60 grams on it

## 2015-02-15 ENCOUNTER — Ambulatory Visit (INDEPENDENT_AMBULATORY_CARE_PROVIDER_SITE_OTHER): Payer: Commercial Managed Care - HMO | Admitting: Podiatry

## 2015-02-15 ENCOUNTER — Encounter: Payer: Self-pay | Admitting: Podiatry

## 2015-02-15 ENCOUNTER — Ambulatory Visit (INDEPENDENT_AMBULATORY_CARE_PROVIDER_SITE_OTHER): Payer: Commercial Managed Care - HMO

## 2015-02-15 VITALS — BP 127/78 | HR 104 | Resp 18

## 2015-02-15 DIAGNOSIS — L84 Corns and callosities: Secondary | ICD-10-CM | POA: Diagnosis not present

## 2015-02-15 DIAGNOSIS — R52 Pain, unspecified: Secondary | ICD-10-CM

## 2015-02-15 DIAGNOSIS — L97421 Non-pressure chronic ulcer of left heel and midfoot limited to breakdown of skin: Secondary | ICD-10-CM | POA: Diagnosis not present

## 2015-02-15 NOTE — Patient Instructions (Signed)
Monitor for any signs/symptoms of infection. Call the office immediately if any occur or go directly to the emergency room. Call with any questions/concerns.  

## 2015-02-16 ENCOUNTER — Other Ambulatory Visit: Payer: Self-pay | Admitting: Family Medicine

## 2015-02-16 NOTE — Telephone Encounter (Signed)
Pt called and would like a refill on her Trazodone. She was getting this from [facility] [place] but no longer goes there. jw

## 2015-02-17 ENCOUNTER — Telehealth: Payer: Self-pay | Admitting: *Deleted

## 2015-02-17 MED ORDER — TRAZODONE HCL 300 MG PO TABS: 300.0000 mg | ORAL_TABLET | Freq: Every evening | ORAL | Status: DC | PRN

## 2015-02-17 NOTE — Telephone Encounter (Signed)
Pt's brother, [name] states the [facility] has not called .  I told [name], Dr. Jacqualyn Posey had referred them to the [facility] and they will call to set up appts.

## 2015-02-20 ENCOUNTER — Telehealth: Payer: Self-pay | Admitting: Family Medicine

## 2015-02-20 ENCOUNTER — Telehealth: Payer: Self-pay | Admitting: *Deleted

## 2015-02-20 NOTE — Telephone Encounter (Signed)
"  I'm calling in regards to my patient's referral to Wound Care.  She has not received a call to set that up.  I called them to make the appointment and they said they do not check for orders online.  They said they schedule by paper referrals.  The earliest they could get her in is 03/09/2015.  What should she do in the mean time?  Should you reschedule her somewhere else?"  I will send over a referral to [facility].  Let me transfer you to a scheduler to get her an appointment to see Dr. Jacqualyn Posey.  "Okay, that's fine. Thank you."  Referral was faxed to [facility].

## 2015-02-20 NOTE — Telephone Encounter (Signed)
Needs Dr Joya San to fax the current oxygen flow number so more can be ordered. [phone]

## 2015-02-21 ENCOUNTER — Telehealth: Payer: Self-pay

## 2015-02-21 NOTE — Telephone Encounter (Signed)
Can continue antibiotics. Follow up as schedule or sooner if she needs. Thanks.

## 2015-02-21 NOTE — Telephone Encounter (Signed)
Will forward to MD to write the order for patient's current oxygen amount.  Will print here and fax to number listed. [name],CMA

## 2015-02-21 NOTE — Progress Notes (Signed)
Patient ID: Kathleen Roy, female   DOB: 12-05-1961, 54 y.o.   MRN: [id number]  Subjective: 54 year old female presents the office they with her brother for follow-up evaluation of bilateral heel ulcerations. She states that since last appointment she has had increased pain particularly to the left heel upon weightbearing and pressure. She states that the home health nurses of an coming to her house she states the wound looks better. She denies any redness around the area or any drainage or purulence. No other complaints at this time. Denies any systemic complaints as fevers, chills, nausea, vomiting.   Objective: AAO 3, NAD DP/PT pulses are palpable although decrease, likely secondary to significant edema. CRT less than 3 seconds Decreased protective sensation with Simms Weinstein monofilament, Achilles tendon reflex intact. Chronic bilateral lower extremity edema continues. On the posterior aspect left heel there is a large superficial ulceration encompassing the posterior aspect of the heel. At this appointment there does appear to be an eschar formation overlying the heel. There is no areas of fluctuance or crepitus. There is no areas of drainage or purulence. There is no swelling erythema or ascending cellulitis. No malodor. On the plantar aspect of the right heel there is a pre-ulcerative lesion. There is no overlying hyperkeratotic tissue that can be debrided. There is no open lesions at this time to the right foot. No other open lesions or pre-ulcer lesions identified bilaterally. No pain with calf compression, swelling, warmth, erythema.  Assessment: 54 year old female with bilateral heel pain with increased pain  Plan: -X-rays were obtained and reviewed with the patient. -Treatment options were discussed include alternatives, risks, complications. -At this time as the left heel wound appears to be worsened will refer to the wound care center for further evaluation. For now continue daily  dressing changes with Silvadene. Monitor for any clinical signs or symptoms of infection and directed to call the office immediately or go directly to the emergency room. Continue strict offloading of bilateral heels. -Follow-up in 2 weeks or sooner should any problems arise. If she is able to follow-up with wound care center she does not need a follow-up with me for the wound. Discussed that can continue to see her for other issues as well.

## 2015-02-21 NOTE — Telephone Encounter (Signed)
Spoke with pt regarding antibiotic therapy. She stated that she is currently on antibiotics and has 1 refill remaining, states that her foot is very painful and she feels like it is worsening, denies a fever of chills. Agrees to keep appt with our office on 03/01/15, advised to continue with antibiotic therapy. She also informed me that she was not able to get an appt with the wound center until 3.24.16. I told her I would inform the doctor regarding this information, and if symptoms worsen she needs to go to urgent care or emergency room

## 2015-02-21 NOTE — Telephone Encounter (Signed)
Pt called asking if she needed antibiotic coverage for her foot ulcer, she doesn't have an appt for another week and was concerned about infection

## 2015-02-22 ENCOUNTER — Ambulatory Visit: Payer: Commercial Managed Care - HMO | Admitting: Podiatry

## 2015-02-22 ENCOUNTER — Telehealth: Payer: Self-pay | Admitting: Family Medicine

## 2015-02-22 DIAGNOSIS — G8929 Other chronic pain: Secondary | ICD-10-CM

## 2015-02-22 DIAGNOSIS — M549 Dorsalgia, unspecified: Principal | ICD-10-CM

## 2015-02-22 MED ORDER — TRAZODONE HCL 300 MG PO TABS: 300.0000 mg | ORAL_TABLET | Freq: Every evening | ORAL | Status: DC | PRN

## 2015-02-22 MED ORDER — OXYCODONE-ACETAMINOPHEN 5-325 MG PO TABS: 1.0000 | ORAL_TABLET | Freq: Four times a day (QID) | ORAL | Status: DC | PRN

## 2015-02-22 MED ORDER — NYSTATIN 100000 UNIT/GM EX POWD: Freq: Two times a day (BID) | CUTANEOUS | Status: DC

## 2015-02-22 NOTE — Telephone Encounter (Signed)
Refilled trazodone.  Also refilled Oxycodone and Nystatin.  I will let Dr. Gwendlyn Deutscher address the podiatry referral when she returns on Monday.  Script given to [facility].

## 2015-02-22 NOTE — Telephone Encounter (Signed)
Needs trazadone refilled.

## 2015-02-22 NOTE — Telephone Encounter (Signed)
-   Pt called and need her monthly Rx of oxycodone refilled by Friday 3/11. -She also needs a referral to [facility], fax # [phone] because the providers at [facility] are not meeting her needs and her wound is not getting better. - Lastly, her Rx, Nystatin powder was called into [facility] for 60 grams and 6 bottles. They would only give her one bottle with 60 grams, they told her that in order to receive all 6 bottles the Rx needs to state 6 bottles 360 grams Please call pt when everything is ready @ [phone] thanks / sr

## 2015-02-22 NOTE — Telephone Encounter (Signed)
Please contact Dr Mingo Amber to address this issue. Thanks.

## 2015-02-22 NOTE — Telephone Encounter (Signed)
Pt is aware of rx.  Her brother don will be by tomorrow to pick it up. Aliyyah Riese,CMA

## 2015-02-22 NOTE — Telephone Encounter (Signed)
Will forward to PCP for advice. Kathleen Roy, CMA. 

## 2015-02-22 NOTE — Telephone Encounter (Signed)
Will forward to Dr. Walden. Calen Posch,CMA  

## 2015-02-23 ENCOUNTER — Other Ambulatory Visit: Payer: Self-pay | Admitting: Family Medicine

## 2015-02-23 DIAGNOSIS — L97509 Non-pressure chronic ulcer of other part of unspecified foot with unspecified severity: Secondary | ICD-10-CM

## 2015-02-23 NOTE — Progress Notes (Signed)
Podiatrist referral completed. Patient request new referral for second opinion. Also has wound clinic appointment set up already.

## 2015-02-27 NOTE — Telephone Encounter (Signed)
Spoke with patient and she is using 4L of oxygen.  When she contacted [name] they needed a new prescription to reflect her new oxygen flow rate. [name],CMA

## 2015-02-27 NOTE — Telephone Encounter (Signed)
Please clarify how much oxygen she is currently using? Which organization does the fax number provided belong to? Can she obtain documents to be completed for her home O2?

## 2015-02-28 ENCOUNTER — Telehealth: Payer: Self-pay | Admitting: Family Medicine

## 2015-02-28 NOTE — Telephone Encounter (Signed)
Spoke with pt about chest pain.  Pt stated she has not experienced any chest pain this morning or yesterday. Pt has dullness that last about a minute.  Pt denies any SOB; pt stated the numbness/tingling comes from her neuropathy.  Will forward to PCP.  Pt also advised she might have to come in for an appt. Pt stated understanding.  Derl Barrow, RN

## 2015-02-28 NOTE — Telephone Encounter (Signed)
Please have her come in soon to be seen , if symptoms worsens to go to the ED.

## 2015-02-28 NOTE — Telephone Encounter (Signed)
Kathleen Roy visited the patient and wanted to report that the pt had been having occasional chest pain, says it happens once in a while but has noticed it has happened the last 3-4 days. Kathleen Roy said the patients vitals, O2 stats were good and the patient didn't appear to be having any acute distress. Patient is not scheduled with her pcp until 4/4, would like [place] to either call patient or see if she needs to be seen sooner?

## 2015-02-28 NOTE — Telephone Encounter (Signed)
Script written and placed up front to be faxed to [facility].

## 2015-02-28 NOTE — Telephone Encounter (Signed)
Per [name] Shenker-brother: needs a [facility] referral sent thru [facility] with Dr Marion Downer name on the referral

## 2015-03-01 ENCOUNTER — Other Ambulatory Visit: Payer: Self-pay | Admitting: *Deleted

## 2015-03-01 ENCOUNTER — Ambulatory Visit: Payer: Commercial Managed Care - HMO | Admitting: Podiatry

## 2015-03-01 NOTE — Telephone Encounter (Signed)
Left voice message for pt to call nurse for an appt per Dr. Gwendlyn Deutscher.  Derl Barrow, RN

## 2015-03-01 NOTE — Telephone Encounter (Signed)
Refill request was sent for Silvadene 1% cream.  Dr. Berton Lan the refill plus 2 additional refills.

## 2015-03-01 NOTE — Telephone Encounter (Signed)
Referral authorization received.  Jenasia Dolinar,CMA

## 2015-03-01 NOTE — Telephone Encounter (Signed)
Message given to pt. She hasnt had any chest pains since Sunday. Says it would be very hard to come in now because of the pressure wound on her heel She feels the chest pain is related to the extreme heel pain She chose not to make an appt

## 2015-03-02 ENCOUNTER — Telehealth: Payer: Self-pay | Admitting: Family Medicine

## 2015-03-02 NOTE — Telephone Encounter (Signed)
[facility] called to say that we faxed them orders to refill the patients oxygen. They do not service this for this patient. This needed to be faxed to the agency that the patient uses. jw

## 2015-03-02 NOTE — Telephone Encounter (Signed)
Spoke with patient and order needs to be faxed to [facility].  [phone].  Informed patient that i would be refaxing these orders today. [name],CMA

## 2015-03-03 ENCOUNTER — Telehealth: Payer: Self-pay | Admitting: Family Medicine

## 2015-03-03 NOTE — Telephone Encounter (Signed)
[facility] called and would like the notes from the patients last visit. She is a new patient and would also like her demographics. Please fax all this to [phone]. jw

## 2015-03-03 NOTE — Telephone Encounter (Signed)
Faxed notes to [facility] at fax number [phone]. Nishka Heide, CMA.

## 2015-03-08 ENCOUNTER — Emergency Department (HOSPITAL_COMMUNITY)
Admission: EM | Admit: 2015-03-08 | Discharge: 2015-03-09 | Disposition: A | Discharge status: Home / Self Care | Payer: Commercial Managed Care - HMO | Attending: Emergency Medicine | Admitting: Emergency Medicine

## 2015-03-08 ENCOUNTER — Encounter (HOSPITAL_COMMUNITY): Payer: Self-pay

## 2015-03-08 DIAGNOSIS — E785 Hyperlipidemia, unspecified: Secondary | ICD-10-CM | POA: Diagnosis not present

## 2015-03-08 DIAGNOSIS — Z8619 Personal history of other infectious and parasitic diseases: Secondary | ICD-10-CM | POA: Insufficient documentation

## 2015-03-08 DIAGNOSIS — F329 Major depressive disorder, single episode, unspecified: Secondary | ICD-10-CM | POA: Insufficient documentation

## 2015-03-08 DIAGNOSIS — L8990 Pressure ulcer of unspecified site, unspecified stage: Secondary | ICD-10-CM

## 2015-03-08 DIAGNOSIS — Z87891 Personal history of nicotine dependence: Secondary | ICD-10-CM | POA: Insufficient documentation

## 2015-03-08 DIAGNOSIS — G8929 Other chronic pain: Secondary | ICD-10-CM | POA: Insufficient documentation

## 2015-03-08 DIAGNOSIS — M79672 Pain in left foot: Secondary | ICD-10-CM

## 2015-03-08 DIAGNOSIS — M797 Fibromyalgia: Secondary | ICD-10-CM | POA: Diagnosis not present

## 2015-03-08 DIAGNOSIS — M1990 Unspecified osteoarthritis, unspecified site: Secondary | ICD-10-CM | POA: Insufficient documentation

## 2015-03-08 DIAGNOSIS — L89629 Pressure ulcer of left heel, unspecified stage: Secondary | ICD-10-CM | POA: Insufficient documentation

## 2015-03-08 DIAGNOSIS — Z79899 Other long term (current) drug therapy: Secondary | ICD-10-CM | POA: Insufficient documentation

## 2015-03-08 DIAGNOSIS — Z4800 Encounter for change or removal of nonsurgical wound dressing: Secondary | ICD-10-CM | POA: Diagnosis present

## 2015-03-08 NOTE — ED Provider Notes (Signed)
CSN: [id number]     Arrival date & time 03/08/15  1903 History   First MD Initiated Contact with Patient 03/08/15 2259     Chief Complaint  Patient presents with  . Wound Check     (Consider location/radiation/quality/duration/timing/severity/associated sxs/prior Treatment) The history is provided by the patient. No language interpreter was used.  Kathleen Roy is a 54 year old female with past medical history of fibromyalgia, obesity, hyperlipidemia, depression, neuropathy, peripheral edema, osteoarthritis, migraine, chronic lower back pain, arthritis presenting to the ED with a pressure ulcer to her left calcaneal region is been ongoing for approximately 5-6 weeks. Patient reports that she is seen and assessed by podiatry who started the patient on Keflex for approximately one month, reported that she finished her course this Sunday. Patient reported that throughout the process she has been having excruciating pain, but reported the pain was worse yesterday. Reported that the pain is constant. Reported that she has been able to apply pressure to the foot, but very minimal. Reported that she has noticed drainage of a brownish/reddish tinge with a foul odor-patient and companion reported that the smell and cytokine conversely worse in the past couple of weeks. Patient reported that she has a home health aide that comes out to change the dressings 2 times per week, reported that her brother then performs rest of the procedure. Reported that she's been using antibiotic ointment as well. Stated that she has a long-standing history of neuropathy. Stated that she is followed by podiatry, and reported that she has appointment tomorrow, 03/09/2015 with wound care center, but stated that she just could not wait any longer secondary to her pain. Stated that she's been using Percocets without relief. Denied redness, swelling, falls, injuries, numbness, tingling, loss sensation, leg swelling, fever, chills, nausea,  vomiting, abdominal pain, chest pain, shortness of breath, difficulty breathing. Denied being a diabetic. PCP Dr. Gwendlyn Deutscher   Past Medical History  Diagnosis Date  . Normal echocardiogram 05/30/05    suboptimal study  . Obesity, morbid (more than 100 lbs over ideal weight or BMI > 40)     obese since childhood  . Fibromyalgia   . Hyperlipidemia   . Sepsis   . Depression   . Neuropathy   . Peripheral edema   . Memory disturbance   . Sleep apnea     "recently dx'd; haven't got my equipment yet" (11/08/2014)  . Headache     "at least weekly; it's usually when I first wake up" (11/08/2014)  . Migraines 1970's - <2000    "they just went away"  . Osteoarthritis   . Arthritis     "I feel like it's everywhere" (11/08/2014)  . Chronic lower back pain    Past Surgical History  Procedure Laterality Date  . Total abdominal hysterectomy  1994  . Abdominal hernia repair  2002  . Appendectomy  1995  . Cholecystectomy  1995   Family History  Problem Relation Age of Onset  . Stroke Mother   . Dementia Father   . Congestive Heart Failure Brother   . Diabetes Brother   . Hypertension Brother   . Prostate cancer Father    History  Substance Use Topics  . Smoking status: Former Smoker -- 1.00 packs/day for 30 years    Types: Cigarettes    Quit date: 01/05/2009  . Smokeless tobacco: Never Used  . Alcohol Use: No   OB History    Gravida Para Term Preterm AB TAB SAB Ectopic Multiple Living  0 0 0 0 0 0 0 0 0 0      Review of Systems  Constitutional: Positive for chills. Negative for fever.  Respiratory: Negative for chest tightness and shortness of breath.   Cardiovascular: Negative for chest pain.  Gastrointestinal: Negative for nausea, vomiting and abdominal pain.  Musculoskeletal: Positive for arthralgias (left foot).  Skin: Positive for wound.      Allergies  Aspirin  Home Medications   Prior to Admission medications   Medication Sig Start Date End Date Taking?  Authorizing Provider  acetaminophen (TYLENOL) 500 MG tablet Take 1,000 mg by mouth every 6 (six) hours as needed for moderate pain (pain).   Yes Historical Provider, MD  ARIPiprazole (ABILIFY) 2 MG tablet Take 1 tablet (2 mg total) by mouth every other day. 12/01/14  Yes [name], MD  Ascorbic Acid (VITAMIN C) 1000 MG tablet Take 3,000 mg by mouth at bedtime.    Yes Historical Provider, MD  metolazone (ZAROXOLYN) 2.5 MG tablet Take 1 tablet (2.5 mg total) by mouth once a week. 01/23/15  Yes [name], MD  mupirocin ointment (BACTROBAN) 2 % Apply to affected skin BID. 02/09/15  Yes [name], MD  nystatin (MYCOSTATIN) powder Apply topically 2 (two) times daily. Apply to affected skin BID 02/22/15  Yes Alveda Reasons, MD  Omega-3 Fatty Acids (FISH OIL) 1000 MG CAPS Take 2 capsules by mouth at bedtime and may repeat dose one time if needed.    Yes Historical Provider, MD  oxyCODONE-acetaminophen (PERCOCET/ROXICET) 5-325 MG per tablet Take 1 tablet by mouth every 6 (six) hours as needed for moderate pain or severe pain. 02/22/15  Yes Alveda Reasons, MD  polyethylene glycol [facility] / GLYCOLAX) packet Take 17 g by mouth at bedtime.    Yes Historical Provider, MD  potassium chloride SA (KLOR-CON M20) 20 MEQ tablet Take 2 tablets (40 mEq total) by mouth daily. 12/14/14  Yes [name], MD  pravastatin (PRAVACHOL) 40 MG tablet Take 1 tablet (40 mg total) by mouth at bedtime. 09/22/14  Yes [name], MD  pregabalin (LYRICA) 150 MG capsule Take 1 capsule (150 mg total) by mouth 2 (two) times daily. 12/14/14  Yes [name], MD  PSYLLIUM PO Take 2 capsules by mouth at bedtime.   Yes Historical Provider, MD  silver sulfADIAZINE (SILVADENE) 1 % cream Apply 1 application topically daily. 02/01/15  Yes Trula Slade, DPM  torsemide (DEMADEX) 20 MG tablet Take 4 tablets (80 mg total) by mouth daily. 11/17/14  Yes [name], MD  traZODone (DESYREL) 150 MG tablet Take 300  mg by mouth at bedtime.   Yes Historical Provider, MD  triamterene-hydrochlorothiazide (MAXZIDE-25) 37.5-25 MG per tablet Take 1 tablet by mouth daily. 12/14/14  Yes [name], MD  Venlafaxine HCl 225 MG TB24 Take 1 tablet (225 mg total) by mouth daily. 07/05/14  Yes [name], MD  albuterol (PROVENTIL HFA;VENTOLIN HFA) 108 (90 BASE) MCG/ACT inhaler Inhale 2 puffs into the lungs every 6 (six) hours as needed for wheezing or shortness of breath. 01/20/15   [name], MD  cephALEXin (KEFLEX) 500 MG capsule Take 1 capsule (500 mg total) by mouth 3 (three) times daily. Patient not taking: Reported on 03/08/2015 02/01/15   Trula Slade, DPM  trazodone (DESYREL) 300 MG tablet Take 1 tablet (300 mg total) by mouth at bedtime as needed for sleep. ([place]) Patient not taking: Reported on 03/08/2015 02/22/15  Alveda Reasons, MD   BP 113/60 mmHg  Pulse 88  Temp(Src) 97.8 F (36.6 C) (Oral)  Resp 18  Ht 5\' 2"  (1.575 m)  Wt 365 lb (165.563 kg)  BMI 66.74 kg/m2  SpO2 93% Physical Exam  Constitutional: She is oriented to person, place, and time. She appears well-developed and well-nourished. No distress.  HENT:  Head: Normocephalic and atraumatic.  Eyes: Conjunctivae and EOM are normal. Pupils are equal, round, and reactive to light. Right eye exhibits no discharge. Left eye exhibits no discharge.  Neck: Normal range of motion. Neck supple.  Cardiovascular: Normal rate, regular rhythm and normal heart sounds.   Swelling noted to the feet bilaterally  Pulmonary/Chest: Effort normal and breath sounds normal. No respiratory distress. She has no wheezes. She has no rales.  Musculoskeletal: Normal range of motion.  Neurological: She is alert and oriented to person, place, and time. No cranial nerve deficit. She exhibits normal muscle tone. Coordination normal.  Skin: She is not diaphoretic. There is erythema.  Dark brown discoloration identified to the left calcaneus.  Bandage identified to have a yellow, reddish tinged discharge that is foul-smelling. Tenderness upon palpation. Sloughing of the skin identified. Negative active bleeding noted. Negative palpation of muscle, tendon, bone. Negative crepitus upon palpation. Swollen foot identified - appears to be a chronic finding - bilateral feet are swollen. Full range of motion to the digits of the left foot. Negative red streaks. Negative palpable cord.  Psychiatric: She has a normal mood and affect. Her behavior is normal. Thought content normal.  Nursing note and vitals reviewed.   ED Course  Procedures (including critical care time)  Results for orders placed or performed during the hospital encounter of 03/08/15  CBC with Differential/Platelet  Result Value Ref Range   WBC 7.8 4.0 - 10.5 K/uL   RBC 4.10 3.87 - 5.11 MIL/uL   Hemoglobin 12.6 12.0 - 15.0 g/dL   HCT 40.3 36.0 - 46.0 %   MCV 98.3 78.0 - 100.0 fL   MCH 30.7 26.0 - 34.0 pg   MCHC 31.3 30.0 - 36.0 g/dL   RDW 14.8 11.5 - 15.5 %   Platelets 239 150 - 400 K/uL   Neutrophils Relative % 67 43 - 77 %   Neutro Abs 5.2 1.7 - 7.7 K/uL   Lymphocytes Relative 27 12 - 46 %   Lymphs Abs 2.1 0.7 - 4.0 K/uL   Monocytes Relative 6 3 - 12 %   Monocytes Absolute 0.4 0.1 - 1.0 K/uL   Eosinophils Relative 0 0 - 5 %   Eosinophils Absolute 0.0 0.0 - 0.7 K/uL   Basophils Relative 0 0 - 1 %   Basophils Absolute 0.0 0.0 - 0.1 K/uL  Comprehensive metabolic panel  Result Value Ref Range   Sodium 142 135 - 145 mmol/L   Potassium 3.1 (L) 3.5 - 5.1 mmol/L   Chloride 92 (L) 96 - 112 mmol/L   CO2 39 (H) 19 - 32 mmol/L   Glucose, Bld 152 (H) 70 - 99 mg/dL   BUN 21 6 - 23 mg/dL   Creatinine, Ser 0.76 0.50 - 1.10 mg/dL   Calcium 9.8 8.4 - 10.5 mg/dL   Total Protein 7.4 6.0 - 8.3 g/dL   Albumin 3.6 3.5 - 5.2 g/dL   AST 17 0 - 37 U/L   ALT 12 0 - 35 U/L   Alkaline Phosphatase 79 39 - 117 U/L   Total Bilirubin 0.6 0.3 - 1.2 mg/dL  GFR calc non Af Amer >90 >90  mL/min   GFR calc Af Amer >90 >90 mL/min   Anion gap 11 5 - 15    Labs Review Labs Reviewed  COMPREHENSIVE METABOLIC PANEL - Abnormal; Notable for the following:    Potassium 3.1 (*)    Chloride 92 (*)    CO2 39 (*)    Glucose, Bld 152 (*)    All other components within normal limits  CBC WITH DIFFERENTIAL/PLATELET    Imaging Review Dg Foot Complete Left  03/09/2015   CLINICAL DATA:  Wound at the plantar aspect of the foot, with worsening acute pain. Left foot swelling and pain. Initial encounter.  EXAM: LEFT FOOT - COMPLETE 3+ VIEW  COMPARISON:  Left foot radiographs performed 11/16/2007  FINDINGS: There is no evidence of fracture or dislocation. The joint spaces are preserved. There is no evidence of talar subluxation; the subtalar joint is unremarkable in appearance. A small posterior calcaneal spur is noted.  Marked soft tissue swelling is noted about the entirety of the foot. The plantar soft tissue wound is partially characterized. No radiopaque foreign bodies are seen.  IMPRESSION: No evidence of fracture or dislocation. Marked soft tissue swelling about the entirety of the foot. No radiopaque foreign bodies seen.   Electronically Signed   By: Garald Balding M.D.   On: 03/09/2015 01:17     EKG Interpretation None       12;37 AM Patient seen and assessed by attending physician, Dr. Leonides Schanz. As per physician, reported that this appears to be chronic. Agreed with labs and imaging. As per physician reported that patient can be discharged home with Wound Care scheduled appointment tomorrow since negative elevated WBC.   MDM   Final diagnoses:  Pressure ulcer  Left foot pain    Medications  oxyCODONE-acetaminophen (PERCOCET/ROXICET) 5-325 MG per tablet 1 tablet (1 tablet Oral Given 03/09/15 0115)    Filed Vitals:   03/08/15 1932 03/08/15 2241 03/09/15 0038  BP: 128/66 120/59 113/60  Pulse: 97 90 88  Temp: 97.8 F (36.6 C)    TempSrc: Oral    Resp: 20 18 18   Height: 5\' 2"   (1.575 m)    Weight: 365 lb (165.563 kg)    SpO2: 93% 92% 93%    This provider reviewed patient's chart. Patient was seen and assessed a podiatry on 02/15/2015 or patient was diagnosed with left calcaneal ulcer and was started on Keflex and Silvadene. Patient was recommended to follow-up with an approximate 2 weeks if symptoms are to worsen. Recommendation for wound care center was performed with setting up a schedule for 03/09/2015. CBC negative elevated leukocytosis. Hemoglobin 12.6, hematocrit 40.3. CMP noted mildly low potassium of 3.1-patient had issues with potassium in the past. Glucose 152, negative elevated anion gap. Plain film of left foot no evidence of fracture or dislocation. Marked soft tissue swelling about the entirety of the foot with no radiographic for body seen. Negative signs of ischemia. Negative signs of acute infection. Negative findings of acute cellulitis. Negative crepitus upon palpation. Negative findings of gangrene. Negative signs of osteomyelitis. 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 Patient seen and assessed by attending physician, Dr. Leonides Schanz, who recommended patient to be discharged and for patient to follow-up with [facility] tomorrow. Patient has history of issues with potassium is currently taking potassium at home. Patient stable, afebrile. Patient not septic appearing. Discharged patient. Discussed with patient to rest and stay hydrated. Discussed with  patient to continue to elevate and to decrease applying pressure to the left heel. Discussed with patient to keep appointment with wound care center tomorrow to be evaluated. Referred patient to PCP to be reassessed and for potassium to be followed closely. Discussed with patient to closely monitor symptoms and if symptoms are to worsen or change to report back to the ED - strict return instructions given.  Patient agreed to plan of care, understood, all questions answered.   Jamse Mead, PA-C 03/09/15 0145

## 2015-03-08 NOTE — ED Notes (Addendum)
Per EMS, Pt from home.  C/O pain on left bottom foot.  Diagnosed with pressure sore a month ago.  Pt taking po and topical antibiotics.  Pain meds at home not working.  Pt has neuropathy.  Pt is not a diabetic.  Pt went to foot doctor.  Vitals: 130/96, hr 82, resp 16, Pt has bag of meds with her.

## 2015-03-08 NOTE — ED Notes (Signed)
Patient reports she has had a wound on the bottom of her foot, that has been in place for at least 4 weeks.  She has an appointment at wound care center tomorrow, but reports the pain has been unbearable for the past two days.

## 2015-03-08 NOTE — ED Notes (Signed)
Pt states she is on 4 L [place] at all time at home, so pt placed on 4 L [place]. O2 on RA 92%, up to 100% with [place].

## 2015-03-09 ENCOUNTER — Other Ambulatory Visit: Payer: Self-pay | Admitting: *Deleted

## 2015-03-09 ENCOUNTER — Encounter (HOSPITAL_BASED_OUTPATIENT_CLINIC_OR_DEPARTMENT_OTHER): Payer: Commercial Managed Care - HMO

## 2015-03-09 ENCOUNTER — Emergency Department (HOSPITAL_COMMUNITY): Payer: Commercial Managed Care - HMO

## 2015-03-09 LAB — CBC WITH DIFFERENTIAL/PLATELET
BASOS ABS: 0 10*3/uL (ref 0.0–0.1)
Basophils Relative: 0 % (ref 0–1)
Eosinophils Absolute: 0 10*3/uL (ref 0.0–0.7)
Eosinophils Relative: 0 % (ref 0–5)
HCT: 40.3 % (ref 36.0–46.0)
Hemoglobin: 12.6 g/dL (ref 12.0–15.0)
LYMPHS PCT: 27 % (ref 12–46)
Lymphs Abs: 2.1 10*3/uL (ref 0.7–4.0)
MCH: 30.7 pg (ref 26.0–34.0)
MCHC: 31.3 g/dL (ref 30.0–36.0)
MCV: 98.3 fL (ref 78.0–100.0)
Monocytes Absolute: 0.4 10*3/uL (ref 0.1–1.0)
Monocytes Relative: 6 % (ref 3–12)
NEUTROS PCT: 67 % (ref 43–77)
Neutro Abs: 5.2 10*3/uL (ref 1.7–7.7)
PLATELETS: 239 10*3/uL (ref 150–400)
RBC: 4.1 MIL/uL (ref 3.87–5.11)
RDW: 14.8 % (ref 11.5–15.5)
WBC: 7.8 10*3/uL (ref 4.0–10.5)

## 2015-03-09 LAB — COMPREHENSIVE METABOLIC PANEL WITH GFR
ALT: 12 U/L (ref 0–35)
AST: 17 U/L (ref 0–37)
Albumin: 3.6 g/dL (ref 3.5–5.2)
Alkaline Phosphatase: 79 U/L (ref 39–117)
Anion gap: 11 (ref 5–15)
BUN: 21 mg/dL (ref 6–23)
CO2: 39 mmol/L — ABNORMAL HIGH (ref 19–32)
Calcium: 9.8 mg/dL (ref 8.4–10.5)
Chloride: 92 mmol/L — ABNORMAL LOW (ref 96–112)
Creatinine, Ser: 0.76 mg/dL (ref 0.50–1.10)
GFR calc Af Amer: >90 mL/min
GFR calc non Af Amer: >90 mL/min
Glucose, Bld: 152 mg/dL — ABNORMAL HIGH (ref 70–99)
Potassium: 3.1 mmol/L — ABNORMAL LOW (ref 3.5–5.1)
Sodium: 142 mmol/L (ref 135–145)
Total Bilirubin: 0.6 mg/dL (ref 0.3–1.2)
Total Protein: 7.4 g/dL (ref 6.0–8.3)

## 2015-03-09 MED ORDER — OXYCODONE-ACETAMINOPHEN 5-325 MG PO TABS
1.0000 | ORAL_TABLET | Freq: Once | ORAL | Status: AC
  Administered 2015-03-09: 1 via ORAL
  Filled 2015-03-09: qty 1

## 2015-03-09 MED ORDER — TORSEMIDE 20 MG PO TABS: 80.0000 mg | ORAL_TABLET | Freq: Every day | ORAL | Status: DC

## 2015-03-09 NOTE — ED Notes (Signed)
[facility] called for transportation to home 

## 2015-03-09 NOTE — ED Provider Notes (Signed)
Medical screening examination/treatment/procedure(s) were conducted as a shared visit with non-physician practitioner(s) and myself.  I personally evaluated the patient during the encounter.   EKG Interpretation None      Pt is a 54 y.o. F with chronic LLE wound with increased pain.  No signs of superimposed infection. Labs unremarkable. No leukocytosis. No fever. No sign of bony destruction. Has wound care follow-up tomorrow.  [place], DO 03/09/15 [phone]

## 2015-03-09 NOTE — Discharge Instructions (Signed)
Please call your doctor for a followup appointment within 24-48 hours. When you talk to your doctor please let them know that you were seen in the emergency department and have them acquire all of your records so that they can discuss the findings with you and formulate a treatment plan to fully care for your new and ongoing problems. Please keep appointment with [facility] tomorrow Please follow up with your primary care provider regarding re-check of potassium  Please rest and stay hydrated Please decrease pressure to the foot Please continue to monitor symptoms closely and if symptoms are to worsen or change (fever greater than 101, chills, sweating, nausea, vomiting, chest pain, shortness of breathe, difficulty breathing, weakness, numbness, tingling, worsening or changes to pain pattern, fall, injury, red streaks running up the legs, green-yellow discharge, redness, warm to the touch of the foot, blackening of the tissue, bleeding, coldness or changes to temperature of the leg) please report back to the Emergency Department immediately.   Pressure Ulcer A pressure ulcer is a sore that has formed from the breakdown of skin and exposure of deeper layers of tissue. It develops in areas of the body where there is unrelieved pressure. Pressure ulcers are usually found over a bony area, such as the shoulder blades, spine, lower back, hips, knees, ankles, and heels. Pressure ulcers vary in severity. Your health care provider may determine the severity (stage) of your pressure ulcer. The stages include:  Stage I--The skin is red, and when the skin is pressed, it stays red.  Stage II--The top layer of skin is gone, and there is a shallow, pink ulcer.  Stage III--The ulcer becomes deeper, and it is more difficult to see the whole wound. Also, there may be yellow or brown parts, as well as pink and red parts.  Stage IV--The ulcer may be deep and red, pink, brown, white, or yellow. Bone or muscle  may be seen.  Unstageable pressure ulcer--The ulcer is covered almost completely with black, brown, or yellow tissue. It is not known how deep the ulcer is or what stage it is until this covering comes off.  Suspected deep tissue injury--A person's skin can be injured from pressure or pulling on the skin when his or her position is changed. The skin appears purple or maroon. There may not be an opening in the skin, but there could be a blood-filled blister. This deep tissue injury is often difficult to see in people with darker skin tones. The site may open and become deeper in time. However, early interventions will help the area heal and may prevent the area from opening. CAUSES  Pressure ulcers are caused by pressure against the skin that limits the flow of blood to the skin and nearby tissues. There are many risk factors that can lead to pressure sores. RISK FACTORS  Decreased ability to move.  Decreased ability to feel pain or discomfort.  Excessive skin moisture from urine, stool, sweat, or secretions.  Poor nutrition.  Dehydration.  Tobacco, drug, or alcohol abuse.  Having someone pull on bedsheets that are under you, such as when health care workers are changing your position in a hospital bed.  Obesity.  Increased adult age.  Hospitalization in a critical care unit for longer than 4 days with use of medical devices.  Prolonged use of medical devices.  Critical illness.  Anemia.  Traumatic brain injury.  Spinal cord injury.  Stroke.  Diabetes.  Poor blood glucose control.  Low blood pressure (  hypotension).  Low oxygen levels.  Medicines that reduce blood flow.  Infection. DIAGNOSIS  Your health care provider will diagnose your pressure ulcer based on its appearance. The health care provider may determine the stage of your pressure ulcer as well. Tests may be done to check for infection, to assess your circulation, or to check for other diseases, such as  diabetes. TREATMENT  Treatment of your pressure ulcer begins with determining what stage the ulcer is in. Your treatment team may include your health care provider, a wound care specialist, a nutritionist, a physical therapist, and a Psychologist, sport and exercise. Possible treatments may include:   Moving or repositioning every 1-2 hours.  Using beds or mattresses to shift your body weight and pressure points frequently.  Improving your diet.  Cleaning and bandaging (dressing) the open wound.  Giving antibiotic medicines.  Removing damaged tissue.  Surgery and sometimes skin grafts. HOME CARE INSTRUCTIONS  If you were hospitalized, follow the care plan that was started in the hospital.  Avoid staying in the same position for more than 2 hours. Use padding, devices, or mattresses to cushion your pressure points as directed by your health care provider.  Eat a well-balanced diet. Take nutritional supplements and vitamins as directed by your health care provider.  Keep all follow-up appointments.  Only take over-the-counter or prescription medicines for pain, fever, or discomfort as directed by your health care provider. SEEK MEDICAL CARE IF:   Your pressure ulcer is not improving.  You do not know how to care for your pressure ulcer.  You notice other areas of redness on your skin.  You have a fever. SEEK IMMEDIATE MEDICAL CARE IF:   You have increasing redness, swelling, or pain in your pressure ulcer.  You notice pus coming from your pressure ulcer.  You notice a bad smell coming from the wound or dressing.  Your pressure ulcer opens up again. Document Released: 12/02/2005 Document Revised: 12/07/2013 Document Reviewed: 08/09/2013 [facility] Patient Information 2015 [place], [place]. This information is not intended to replace advice given to you by your health care provider. Make sure you discuss any questions you have with your health care provider.

## 2015-03-13 ENCOUNTER — Telehealth: Payer: Self-pay | Admitting: Family Medicine

## 2015-03-13 NOTE — Telephone Encounter (Signed)
Will forward to PCP for review. Emil Weigold, CMA. 

## 2015-03-13 NOTE — Telephone Encounter (Signed)
Nurse with [facility] suggest pt seeking a bariatric mattress. Wants dr Gwendlyn Deutscher to aware of the paperwork being sent

## 2015-03-14 NOTE — Telephone Encounter (Signed)
I will complete paper work once I get it.

## 2015-03-14 NOTE — Telephone Encounter (Signed)
Ok! Fairley Copher, CMA.

## 2015-03-15 ENCOUNTER — Other Ambulatory Visit: Payer: Self-pay | Admitting: *Deleted

## 2015-03-15 MED ORDER — PREGABALIN 150 MG PO CAPS: 150.0000 mg | ORAL_CAPSULE | Freq: Two times a day (BID) | ORAL | Status: DC

## 2015-03-15 MED ORDER — POTASSIUM CHLORIDE CRYS ER 20 MEQ PO TBCR: 40.0000 meq | EXTENDED_RELEASE_TABLET | Freq: Every day | ORAL | Status: DC

## 2015-03-18 ENCOUNTER — Telehealth: Payer: Self-pay | Admitting: Family Medicine

## 2015-03-18 MED ORDER — COLLAGENASE 250 UNIT/GM EX OINT: 1.0000 "application " | TOPICAL_OINTMENT | Freq: Every day | CUTANEOUS | Status: DC

## 2015-03-18 NOTE — Telephone Encounter (Signed)
I received a call from advance wound care nurse requesting telephone wound care order for Kathleen Roy and also asked for prescription for Santyl to be sent to the pharmacy. I e-prescribed Santyl.

## 2015-03-21 ENCOUNTER — Encounter: Payer: Self-pay | Admitting: Family Medicine

## 2015-03-21 ENCOUNTER — Ambulatory Visit (INDEPENDENT_AMBULATORY_CARE_PROVIDER_SITE_OTHER): Payer: Commercial Managed Care - HMO | Admitting: Family Medicine

## 2015-03-21 VITALS — BP 128/64 | HR 94 | Temp 98.2°F | Ht 62.0 in

## 2015-03-21 DIAGNOSIS — L989 Disorder of the skin and subcutaneous tissue, unspecified: Secondary | ICD-10-CM

## 2015-03-21 DIAGNOSIS — R05 Cough: Secondary | ICD-10-CM

## 2015-03-21 DIAGNOSIS — L89894 Pressure ulcer of other site, stage 4: Secondary | ICD-10-CM | POA: Diagnosis not present

## 2015-03-21 DIAGNOSIS — R238 Other skin changes: Secondary | ICD-10-CM

## 2015-03-21 DIAGNOSIS — R059 Cough, unspecified: Secondary | ICD-10-CM

## 2015-03-21 DIAGNOSIS — M25561 Pain in right knee: Secondary | ICD-10-CM

## 2015-03-21 DIAGNOSIS — G8929 Other chronic pain: Secondary | ICD-10-CM

## 2015-03-21 DIAGNOSIS — M25562 Pain in left knee: Secondary | ICD-10-CM

## 2015-03-21 DIAGNOSIS — M549 Dorsalgia, unspecified: Secondary | ICD-10-CM | POA: Diagnosis not present

## 2015-03-21 MED ORDER — BENZONATATE 100 MG PO CAPS: 100.0000 mg | ORAL_CAPSULE | Freq: Three times a day (TID) | ORAL | Status: DC | PRN

## 2015-03-21 MED ORDER — OXYCODONE-ACETAMINOPHEN 5-325 MG PO TABS: 1.0000 | ORAL_TABLET | Freq: Four times a day (QID) | ORAL | Status: DC | PRN

## 2015-03-21 NOTE — Progress Notes (Signed)
Subjective:     Patient ID: Kathleen Roy, female   DOB: 12/30/1960, 54 y.o.   MRN: [id number]  HPI  Ulcer:Patient now has an ulcer of her left heel for more than 2 wks now, gradually getting larger and painful, she has been using extra dose of her percocet. She uses two tablet of her percocet every 12 hrs and in between use will take one or two extra doses. Her recommended dose for back and knee pain was 1 tablet every 6 hrs. Patient had seen wound specialist for her ulcer and has an appointment scheduled for next Wednesday. She was placed on A/B (Cipro) which she had been on for few days now. She denies any fever. Skin irritation: Here to follow up with her right posterior thigh irritation, she stated mupirocin is not helping much. She sits on this area for a prolonged period of time since she is not very much ambulatory. Obesity:Patient stated she feels she had lost weight, she continues to work on her diet but unable to exercise. She need a new air mattress because her weight is too much for her current mattress hence cause a deep hole in her mattress. Knee pain: here for follow up, she need refill of her pain medicine. Cough:Patient has been coughing for about 2 wks, she denies chest pain, no new SOB, no fever, no sputum production, coughing is on and off.  Current Outpatient Prescriptions on File Prior to Visit  Medication Sig Dispense Refill  . albuterol (PROVENTIL HFA;VENTOLIN HFA) 108 (90 BASE) MCG/ACT inhaler Inhale 2 puffs into the lungs every 6 (six) hours as needed for wheezing or shortness of breath. 18 g 3  . ARIPiprazole (ABILIFY) 2 MG tablet Take 1 tablet (2 mg total) by mouth every other day. 15 tablet 3  . collagenase (SANTYL) ointment Apply 1 application topically daily. 90 g 2  . mupirocin ointment (BACTROBAN) 2 % Apply to affected skin BID. 22 g 1  . nystatin (MYCOSTATIN) powder Apply topically 2 (two) times daily. Apply to affected skin BID 360 g 2  . Omega-3 Fatty Acids (FISH  OIL) 1000 MG CAPS Take 2 capsules by mouth at bedtime and may repeat dose one time if needed.     . polyethylene glycol (MIRALAX / GLYCOLAX) packet Take 17 g by mouth at bedtime.     . potassium chloride SA (KLOR-CON M20) 20 MEQ tablet Take 2 tablets (40 mEq total) by mouth daily. 60 tablet 2  . pravastatin (PRAVACHOL) 40 MG tablet Take 1 tablet (40 mg total) by mouth at bedtime. 90 tablet 1  . pregabalin (LYRICA) 150 MG capsule Take 1 capsule (150 mg total) by mouth 2 (two) times daily. 60 capsule 2  . torsemide (DEMADEX) 20 MG tablet Take 4 tablets (80 mg total) by mouth daily. 120 tablet 2  . traZODone (DESYREL) 150 MG tablet Take 300 mg by mouth at bedtime.    . triamterene-hydrochlorothiazide (MAXZIDE-25) 37.5-25 MG per tablet Take 1 tablet by mouth daily. 30 tablet 3  . Venlafaxine HCl 225 MG TB24 Take 1 tablet (225 mg total) by mouth daily. 90 tablet 2  . acetaminophen (TYLENOL) 500 MG tablet Take 1,000 mg by mouth every 6 (six) hours as needed for moderate pain (pain).    . Ascorbic Acid (VITAMIN C) 1000 MG tablet Take 3,000 mg by mouth at bedtime.     . metolazone (ZAROXOLYN) 2.5 MG tablet Take 1 tablet (2.5 mg total) by mouth once a week. (Patient not  taking: Reported on 03/21/2015) 12 tablet 1  . PSYLLIUM PO Take 2 capsules by mouth at bedtime.    . silver sulfADIAZINE (SILVADENE) 1 % cream Apply 1 application topically daily. 50 g 0   No current facility-administered medications on file prior to visit.   Past Medical History  Diagnosis Date  . Normal echocardiogram 05/30/05    suboptimal study  . Obesity, morbid (more than 100 lbs over ideal weight or BMI > 40)     obese since childhood  . Fibromyalgia   . Hyperlipidemia   . Sepsis   . Depression   . Neuropathy   . Peripheral edema   . Memory disturbance   . Sleep apnea     "recently dx'd; haven't got my equipment yet" (11/08/2014)  . Headache     "at least weekly; it's usually when I first wake up" (11/08/2014)  .  Migraines 1970's - <2000    "they just went away"  . Osteoarthritis   . Arthritis     "I feel like it's everywhere" (11/08/2014)  . Chronic lower back pain        Review of Systems  Constitutional: Negative for fever.  Respiratory: Positive for cough. Negative for chest tightness and wheezing.   Cardiovascular: Negative for chest pain and palpitations.  Gastrointestinal: Negative.   Musculoskeletal: Positive for arthralgias.  Skin: Positive for wound.       Skin irritation  All other systems reviewed and are negative.      Filed Vitals:   03/21/15 1133  BP: 128/64  Pulse: 94  Temp: 98.2 F (36.8 C)  TempSrc: Oral  Height: 5\' 2"  (1.575 m)  SpO2: 96%    Objective:   Physical Exam  Constitutional: She appears well-developed. No distress.  Cardiovascular: Normal rate, regular rhythm, normal heart sounds and intact distal pulses.   No murmur heard. Pulmonary/Chest: Effort normal and breath sounds normal. No respiratory distress. She has no wheezes.  Abdominal: Soft. Bowel sounds are normal. She exhibits no distension and no mass. There is no tenderness.  Musculoskeletal:       Right knee: She exhibits decreased range of motion.       Left knee: She exhibits decreased range of motion.       Lumbar back: She exhibits decreased range of motion.  On wheel chair.  Skin:     Nursing note and vitals reviewed.      Assessment:     Ulcer: Skin irritation: Obesity: Knee pain: Cough    Plan:     Check problem list.

## 2015-03-21 NOTE — Patient Instructions (Signed)
It was nice seeing you today, I am sorry about your wound but am glad you are already plugged in with wound care specialist. Please see your wound specialist as planned next Wednesday. If wound get worse you might need to be in the hospital for wound care. Continue your A/B for now and I will see you back in 1-2 wks.   Dressing Change A dressing is a material placed over wounds. It keeps the wound clean, dry, and protected from further injury.  BEFORE YOU BEGIN  Get your supplies together. Things you may need include:  Salt solution (saline).  Flexible gauze bandage.  Medicated cream.  Tape.  Gloves.  Belly (abdominal) pads.  Gauze squares.  Plastic bags.  Take pain medicine 30 minutes before the bandage change if you need it.  Take a shower before you do the first bandage change of the day. Put plastic wrap or a bag over the dressing. REMOVING YOUR OLD BANDAGE  Wash your hands with soap and water. Dry your hands with a clean towel.  Put on your gloves.  Remove any tape.  Remove the old bandage as told. If it sticks, put a small amount of warm water on it to loosen the bandage.  Remove any gauze or packing tape in your wound.  Take off your gloves.  Put the gloves, tape, gauze, or any packing tape in a plastic bag. CHANGING YOUR BANDAGE  Open the supplies.  Take the cap off the salt solution.  Open the gauze. Leave the gauze on the inside of the package.  Put on your gloves.  Clean your wound as told by your doctor.  Keep your wound dry if your doctor told you to do so.  Your doctor may tell you to do one or more of the following:  Pick up the gauze. Pour the salt solution over the gauze. Squeeze out the extra salt solution.  Put medicated cream or other medicine on your wound.  Put solution soaked gauze only in your wound, not on the skin around it.  Pack your wound loosely.  Put dry gauze on your wound.  Put belly pads over the dry gauze if your  bandages soak through.  Tape the bandage in place so it will not fall off. Do not wrap the tape all the way around your arm or leg.  Wrap the bandage with the flexible gauze bandage as told by your doctor.  Take off your gloves. Put them in the plastic bag with the old bandage. Tie the bag shut and throw it away.  Keep the bandage clean and dry.  Wash your hands. GET HELP RIGHT AWAY IF:   Your skin around the wound looks red.  Your wound feels more tender or sore.  You see yellowish-white fluid (pus) in the wound.  Your wound smells bad.  You have a fever.  Your skin around the wound has a red rash that itches and burns.  You see black or yellow skin in your wound that was not there before.  You feel sick to your stomach (nauseous), throw up (vomit), and feel very tired. Document Released: 02/28/2009 Document Revised: 04/18/2014 Document Reviewed: 10/13/2011 [facility] Patient Information 2015 [place], [place]. This information is not intended to replace advice given to you by your health care provider. Make sure you discuss any questions you have with your health care provider.

## 2015-03-22 ENCOUNTER — Telehealth: Payer: Self-pay | Admitting: Family Medicine

## 2015-03-22 ENCOUNTER — Other Ambulatory Visit: Payer: Self-pay | Admitting: Family Medicine

## 2015-03-22 DIAGNOSIS — M25561 Pain in right knee: Secondary | ICD-10-CM | POA: Insufficient documentation

## 2015-03-22 DIAGNOSIS — R05 Cough: Secondary | ICD-10-CM | POA: Insufficient documentation

## 2015-03-22 DIAGNOSIS — M25562 Pain in left knee: Secondary | ICD-10-CM

## 2015-03-22 DIAGNOSIS — R059 Cough, unspecified: Secondary | ICD-10-CM | POA: Insufficient documentation

## 2015-03-22 DIAGNOSIS — L89894 Pressure ulcer of other site, stage 4: Secondary | ICD-10-CM | POA: Insufficient documentation

## 2015-03-22 NOTE — Telephone Encounter (Signed)
Pt informed that Rx for Lyrica was called into [facility].  Derl Barrow, RN

## 2015-03-22 NOTE — Telephone Encounter (Signed)
Needs refill on Lyrica sent to [facility] on [address] / [employer], A[place]

## 2015-03-22 NOTE — Assessment & Plan Note (Signed)
Bilateral knee and back pain. Her weight is likely contributing to this. I she does well with her percocet. Percocet prn pain. I refilled her medication.

## 2015-03-22 NOTE — Assessment & Plan Note (Addendum)
Left heel ulcer. Wound was cleaned and dressed today. Does not look overtly infected to me. She is although on A/B. Cipro prescribed by wound specialist. She has up coming appointment with wound specialist in 7 days. Return precaution geiven. Advised to follow up in 2 wks for reassessment, if no improvement with oral A/B she might benefit from hospital admission.  NB: She requested increase dose of her percocet for pain.        I suggested she can take one extra pill per day of her percocet 1 tablet q6hrs prn pain for a short period, max 1 wk.        She agreed with plan.

## 2015-03-22 NOTE — Assessment & Plan Note (Signed)
It seems she had gained weight although we were not able to weigh her for this visit due to being unable to stand on the weighing machine. Patient still not interested with weight loss clinic for possible surgery. Her concern in the past had been that she might not be able to comply with diet. She unfortunately can not exercise due to pain. We will continue diet counseling.

## 2015-03-22 NOTE — Assessment & Plan Note (Signed)
Might be related to her COPD. ?? CAP. I recommended chest xray but she is not ready to go through that. I doubt it will change management too since she is already on A/B. If cough persist we will consider imaging. Tessalon pearl prn cough prescribed. Return precaution discussed.

## 2015-03-22 NOTE — Assessment & Plan Note (Signed)
As discussed with patient this looks like a developing stage 1 decubitus ulcer. I advised frequent change in position since she sits on her thigh most time of the day. I will also work on her air mattress hopefully this will help some. Unfortunately with her weight she has risk factors for decubitus ulcer.

## 2015-03-23 ENCOUNTER — Telehealth: Payer: Self-pay | Admitting: Family Medicine

## 2015-03-23 NOTE — Telephone Encounter (Signed)
Pt is very upset when she picked up her meds, says trazadone was decreased from 300 mg to 100 mg, pt has been taking 300 mg for years and doesn't understand why it was decreased. Would like to speak with her doctor about this.

## 2015-03-23 NOTE — Telephone Encounter (Signed)
I spoke with patient. Max dose of Trazodone for insomnia is 200 mg which I have her on now. She was placed on 300 mg by Psych. As discussed with her we will try the 200 mg for now, if it is not effective I will go back to her 300 mg. She is aware of the risk of taking Trazodone 300 mg.  She also mentioned use of Trazodone for depression in which case she can go higher on med. I will reassess her for dose increment in 2-3 wks. Patient verbalized understanding and agreed with plan.

## 2015-03-27 ENCOUNTER — Telehealth: Payer: Self-pay | Admitting: *Deleted

## 2015-03-27 NOTE — Telephone Encounter (Signed)
Received call from [place] from [facility] and she stated that they received order for air mattress and cannot provide services for pt due to her insurance not being in network with them. She wanted you to be aware of this. Navin Dogan, CMA,

## 2015-03-28 ENCOUNTER — Telehealth: Payer: Self-pay | Admitting: Family Medicine

## 2015-03-28 NOTE — Telephone Encounter (Signed)
Fax [phone]. Nickalus Thornsberry,CMA

## 2015-03-28 NOTE — Telephone Encounter (Signed)
Order and most recent office note faxed. Cayetano Mikita,CMA

## 2015-03-28 NOTE — Telephone Encounter (Signed)
[facility] is not in network with Human coverage that Kathleen Roy have.  Need to contact [employer], who is in the network to see if they could provide her with this air mattress.  Please contact patient to inform if they can or cannot and help assist with finding a vendor that will and accept [facility]

## 2015-03-28 NOTE — Telephone Encounter (Signed)
Thanks, another copy of the script has been printed and placed up front to be faxed to [facility].

## 2015-03-29 ENCOUNTER — Encounter (HOSPITAL_BASED_OUTPATIENT_CLINIC_OR_DEPARTMENT_OTHER): Payer: Commercial Managed Care - HMO | Attending: Surgery

## 2015-03-29 DIAGNOSIS — Z6841 Body Mass Index (BMI) 40.0 and over, adult: Secondary | ICD-10-CM | POA: Insufficient documentation

## 2015-03-29 DIAGNOSIS — L97511 Non-pressure chronic ulcer of other part of right foot limited to breakdown of skin: Secondary | ICD-10-CM | POA: Insufficient documentation

## 2015-03-29 DIAGNOSIS — Z87891 Personal history of nicotine dependence: Secondary | ICD-10-CM | POA: Insufficient documentation

## 2015-03-29 DIAGNOSIS — L89624 Pressure ulcer of left heel, stage 4: Secondary | ICD-10-CM | POA: Insufficient documentation

## 2015-03-29 DIAGNOSIS — I890 Lymphedema, not elsewhere classified: Secondary | ICD-10-CM | POA: Insufficient documentation

## 2015-03-31 ENCOUNTER — Other Ambulatory Visit: Payer: Self-pay | Admitting: Family Medicine

## 2015-03-31 MED ORDER — TRAZODONE HCL 150 MG PO TABS: 150.0000 mg | ORAL_TABLET | Freq: Two times a day (BID) | ORAL | Status: DC

## 2015-03-31 NOTE — Telephone Encounter (Signed)
Please let patient know she can switch back to 300 mg Trazodone daily, I have sent refill to pharm.

## 2015-03-31 NOTE — Telephone Encounter (Signed)
Kathleen Roy calling to say that she is not able to continue on the 200mg  of trazadone because her depression is not getting better and having diffculty sleeping.  Want to go back on the 300mg  of medication.  Please call her to discuss.

## 2015-03-31 NOTE — Telephone Encounter (Signed)
Pt informed. Kathleen Roy Dawn  

## 2015-03-31 NOTE — Telephone Encounter (Signed)
Thank you Dr. Gwendlyn Deutscher for helping with this.

## 2015-04-03 DIAGNOSIS — Z6841 Body Mass Index (BMI) 40.0 and over, adult: Secondary | ICD-10-CM | POA: Diagnosis not present

## 2015-04-03 DIAGNOSIS — Z87891 Personal history of nicotine dependence: Secondary | ICD-10-CM | POA: Diagnosis not present

## 2015-04-03 DIAGNOSIS — I890 Lymphedema, not elsewhere classified: Secondary | ICD-10-CM | POA: Diagnosis not present

## 2015-04-03 DIAGNOSIS — L89624 Pressure ulcer of left heel, stage 4: Secondary | ICD-10-CM | POA: Diagnosis present

## 2015-04-03 DIAGNOSIS — L97511 Non-pressure chronic ulcer of other part of right foot limited to breakdown of skin: Secondary | ICD-10-CM | POA: Diagnosis not present

## 2015-04-04 ENCOUNTER — Ambulatory Visit: Payer: Commercial Managed Care - HMO | Admitting: Family Medicine

## 2015-04-05 ENCOUNTER — Other Ambulatory Visit: Payer: Self-pay | Admitting: *Deleted

## 2015-04-05 MED ORDER — PRAVASTATIN SODIUM 40 MG PO TABS: 40.0000 mg | ORAL_TABLET | Freq: Every day | ORAL | Status: DC

## 2015-04-10 ENCOUNTER — Telehealth: Payer: Self-pay | Admitting: Family Medicine

## 2015-04-10 NOTE — Telephone Encounter (Signed)
Pt called and was checking on the status of her request for a air mattress. We faxed this to [employer] on 03/28/15 but they have not heard anything from [facility]. Can we check the status of this and let the patient know. jw

## 2015-04-11 ENCOUNTER — Ambulatory Visit: Payer: Commercial Managed Care - HMO | Admitting: Family Medicine

## 2015-04-11 NOTE — Telephone Encounter (Signed)
I don't have the form yet.

## 2015-04-11 NOTE — Telephone Encounter (Signed)
Can you write this on a prescription pad, so we can get the information to them?  Leslie Langille,CMA

## 2015-04-11 NOTE — Telephone Encounter (Signed)
Script written and placed up front to be faxed to [phone].

## 2015-04-11 NOTE — Telephone Encounter (Signed)
Spoke with Angelia at [facility] and this order is being processed.  Per notes they faxed over a note asking for a more specific diagnosis code.  It is currently stage 4 pressure ulcer.  Will forward to MD to get this new diagnosis faxed over.  [phone]. [name],CMA

## 2015-04-13 ENCOUNTER — Other Ambulatory Visit: Payer: Self-pay | Admitting: *Deleted

## 2015-04-13 DIAGNOSIS — I10 Essential (primary) hypertension: Secondary | ICD-10-CM

## 2015-04-13 MED ORDER — TRIAMTERENE-HCTZ 37.5-25 MG PO TABS: 1.0000 | ORAL_TABLET | Freq: Every day | ORAL | Status: DC

## 2015-04-17 ENCOUNTER — Encounter (HOSPITAL_BASED_OUTPATIENT_CLINIC_OR_DEPARTMENT_OTHER): Payer: Commercial Managed Care - HMO | Attending: Plastic Surgery

## 2015-04-17 ENCOUNTER — Telehealth: Payer: Self-pay

## 2015-04-17 DIAGNOSIS — L97511 Non-pressure chronic ulcer of other part of right foot limited to breakdown of skin: Secondary | ICD-10-CM | POA: Insufficient documentation

## 2015-04-17 DIAGNOSIS — F418 Other specified anxiety disorders: Secondary | ICD-10-CM | POA: Insufficient documentation

## 2015-04-17 DIAGNOSIS — I10 Essential (primary) hypertension: Secondary | ICD-10-CM | POA: Insufficient documentation

## 2015-04-17 DIAGNOSIS — Z9981 Dependence on supplemental oxygen: Secondary | ICD-10-CM | POA: Insufficient documentation

## 2015-04-17 DIAGNOSIS — M1990 Unspecified osteoarthritis, unspecified site: Secondary | ICD-10-CM | POA: Diagnosis not present

## 2015-04-17 DIAGNOSIS — I739 Peripheral vascular disease, unspecified: Secondary | ICD-10-CM | POA: Insufficient documentation

## 2015-04-17 DIAGNOSIS — J449 Chronic obstructive pulmonary disease, unspecified: Secondary | ICD-10-CM | POA: Insufficient documentation

## 2015-04-17 DIAGNOSIS — I890 Lymphedema, not elsewhere classified: Secondary | ICD-10-CM | POA: Diagnosis not present

## 2015-04-17 DIAGNOSIS — G629 Polyneuropathy, unspecified: Secondary | ICD-10-CM | POA: Diagnosis not present

## 2015-04-17 DIAGNOSIS — L89624 Pressure ulcer of left heel, stage 4: Secondary | ICD-10-CM | POA: Insufficient documentation

## 2015-04-17 NOTE — Telephone Encounter (Signed)
Left voicemail that we have an opening tomorrow (5/3) at 3:30. If the patient would like to come in, she will call back to schedule.

## 2015-04-25 ENCOUNTER — Encounter: Payer: Self-pay | Admitting: Family Medicine

## 2015-04-25 ENCOUNTER — Telehealth: Payer: Self-pay | Admitting: Family Medicine

## 2015-04-25 ENCOUNTER — Ambulatory Visit (INDEPENDENT_AMBULATORY_CARE_PROVIDER_SITE_OTHER): Payer: Commercial Managed Care - HMO | Admitting: Family Medicine

## 2015-04-25 VITALS — BP 117/52 | HR 103 | Temp 98.1°F | Ht 62.0 in

## 2015-04-25 DIAGNOSIS — M549 Dorsalgia, unspecified: Secondary | ICD-10-CM

## 2015-04-25 DIAGNOSIS — J449 Chronic obstructive pulmonary disease, unspecified: Secondary | ICD-10-CM

## 2015-04-25 DIAGNOSIS — L89894 Pressure ulcer of other site, stage 4: Secondary | ICD-10-CM

## 2015-04-25 DIAGNOSIS — I10 Essential (primary) hypertension: Secondary | ICD-10-CM | POA: Diagnosis not present

## 2015-04-25 DIAGNOSIS — G8929 Other chronic pain: Secondary | ICD-10-CM

## 2015-04-25 DIAGNOSIS — Z0000 Encounter for general adult medical examination without abnormal findings: Secondary | ICD-10-CM

## 2015-04-25 MED ORDER — OXYCODONE-ACETAMINOPHEN 5-325 MG PO TABS: 1.0000 | ORAL_TABLET | Freq: Four times a day (QID) | ORAL | Status: DC | PRN

## 2015-04-25 NOTE — Telephone Encounter (Signed)
Pt stated that she and Dr. Gwendlyn Deutscher discussed paperwork being sent so that she may receive a low air mattress bed. The company, [name], states that they never received the paperwork that was faxed. They are now faxing more forms so that this can be re-verified. Also, the dimensions of the patient's wound MUST be included in the paperwork or she will not qualify. / Thanks, Fonda Kinder, ASA

## 2015-04-25 NOTE — Assessment & Plan Note (Signed)
No acute change. Respiratory exam benign. Continue Spiriva and Albuterol prn. Continue home O2.

## 2015-04-25 NOTE — Assessment & Plan Note (Signed)
Wound does not appear infected. Wound dressing done by me. She seems to be healing well after debridement. Continue home health wound care. I will reassess her wound at next visit.

## 2015-04-25 NOTE — Progress Notes (Signed)
Subjective:     Patient ID: Kathleen Roy, female   DOB: [birth year], 54 y.o.   MRN: [id number]  HPI Wound care:Patient gets wound care from [facility]. Her brother also help her with wound dressing at home, she denies any pain on her wound currently. Denies any fever. Felt like she is healing well. HTN:She is compliant with her meds, here for follow-up, denies any concern. COPD:Patient is now on 4 L O2. Denies any SOB, no wheezing, feeling fine otherwise. Obesity: Still yet to obtain her air mattress. TI:WPYKD yet to get her mammogram and colonoscopy done. Still not interested in getting it done.  Current Outpatient Prescriptions on File Prior to Visit  Medication Sig Dispense Refill  . acetaminophen (TYLENOL) 500 MG tablet Take 1,000 mg by mouth every 6 (six) hours as needed for moderate pain (pain).    Marland Kitchen albuterol (PROVENTIL HFA;VENTOLIN HFA) 108 (90 BASE) MCG/ACT inhaler Inhale 2 puffs into the lungs every 6 (six) hours as needed for wheezing or shortness of breath. 18 g 3  . ARIPiprazole (ABILIFY) 2 MG tablet Take 1 tablet (2 mg total) by mouth every other day. 15 tablet 3  . Ascorbic Acid (VITAMIN C) 1000 MG tablet Take 3,000 mg by mouth at bedtime.     . benzonatate (TESSALON) 100 MG capsule Take 1 capsule (100 mg total) by mouth 3 (three) times daily as needed for cough. 20 capsule 0  . collagenase (SANTYL) ointment Apply 1 application topically daily. 90 g 2  . metolazone (ZAROXOLYN) 2.5 MG tablet Take 1 tablet (2.5 mg total) by mouth once a week. (Patient not taking: Reported on 03/21/2015) 12 tablet 1  . mupirocin ointment (BACTROBAN) 2 % Apply to affected skin BID. 22 g 1  . nystatin (MYCOSTATIN) powder Apply topically 2 (two) times daily. Apply to affected skin BID 360 g 2  . Omega-3 Fatty Acids (FISH OIL) 1000 MG CAPS Take 2 capsules by mouth at bedtime and may repeat dose one time if needed.     Marland Kitchen oxyCODONE-acetaminophen (PERCOCET/ROXICET) 5-325 MG per tablet Take 1 tablet by mouth  every 6 (six) hours as needed for moderate pain or severe pain. 130 tablet 0  . polyethylene glycol (MIRALAX / GLYCOLAX) packet Take 17 g by mouth at bedtime.     . potassium chloride SA (KLOR-CON M20) 20 MEQ tablet Take 2 tablets (40 mEq total) by mouth daily. 60 tablet 2  . pravastatin (PRAVACHOL) 40 MG tablet Take 1 tablet (40 mg total) by mouth at bedtime. 90 tablet 1  . pregabalin (LYRICA) 150 MG capsule Take 1 capsule (150 mg total) by mouth 2 (two) times daily. 60 capsule 2  . PSYLLIUM PO Take 2 capsules by mouth at bedtime.    . silver sulfADIAZINE (SILVADENE) 1 % cream Apply 1 application topically daily. 50 g 0  . torsemide (DEMADEX) 20 MG tablet Take 4 tablets (80 mg total) by mouth daily. 120 tablet 2  . traZODone (DESYREL) 150 MG tablet Take 1 tablet (150 mg total) by mouth 2 (two) times daily. 180 tablet 1  . triamterene-hydrochlorothiazide (MAXZIDE-25) 37.5-25 MG per tablet Take 1 tablet by mouth daily. 30 tablet 3  . Venlafaxine HCl 225 MG TB24 Take 1 tablet (225 mg total) by mouth daily. 90 tablet 2   No current facility-administered medications on file prior to visit.   Past Medical History  Diagnosis Date  . Normal echocardiogram 05/30/05    suboptimal study  . Obesity, morbid (more than 100  lbs over ideal weight or BMI > 40)     obese since childhood  . Fibromyalgia   . Hyperlipidemia   . Sepsis   . Depression   . Neuropathy   . Peripheral edema   . Memory disturbance   . Sleep apnea     "recently dx'd; haven't got my equipment yet" (11/08/2014)  . Headache     "at least weekly; it's usually when I first wake up" (11/08/2014)  . Migraines 1970's - <2000    "they just went away"  . Osteoarthritis   . Arthritis     "I feel like it's everywhere" (11/08/2014)  . Chronic lower back pain       Review of Systems  Respiratory: Negative.   Cardiovascular: Negative.   Gastrointestinal: Negative.   Genitourinary: Negative.   Skin: Positive for wound.  All other  systems reviewed and are negative.      Filed Vitals:   04/25/15 1131  BP: 117/52  Pulse: 103  Temp: 98.1 F (36.7 C)  TempSrc: Oral  Height: 5\' 2"  (1.575 m)  SpO2: 87%    Objective:   Physical Exam  Constitutional: She appears well-developed. No distress.  Cardiovascular: Normal rate, regular rhythm and normal heart sounds.   No murmur heard. Pulmonary/Chest: Effort normal and breath sounds normal. No respiratory distress. She has no wheezes.  Abdominal: Soft. Bowel sounds are normal. She exhibits no mass. There is no tenderness. There is no guarding.  Musculoskeletal:       Feet:  Nursing note and vitals reviewed.      Assessment:     Wound care HTN: COPD: Obesity HM:    Plan:     Check problem list.

## 2015-04-25 NOTE — Telephone Encounter (Signed)
I will complete paper work as soon as I get it, so far I don't have any paper work.

## 2015-04-25 NOTE — Assessment & Plan Note (Signed)
BP is a little low. Patient feels well otherwise. Per patient her home BP has been fine. Continue current regimen for now.

## 2015-04-25 NOTE — Telephone Encounter (Signed)
Will forward to PCP for review. Kathleen Roy, CMA. 

## 2015-04-25 NOTE — Assessment & Plan Note (Signed)
I again discuss the need for mammogram and colonoscopy. Patient declined both.

## 2015-04-25 NOTE — Patient Instructions (Signed)

## 2015-04-25 NOTE — Assessment & Plan Note (Signed)
Weight not measured today as she was unable to stand on her feet due to her foot ulcer. Again diet counseling done. She is unable to get much exercise. She still does not want nutritionist referral. I will continue to work with her on this.  NB: Still unable to obtain air mattress from [facility].        I had completed form and faxed recently although they denied getting it.        I suggested she call them again and let is know what else to do.        I will follow up on this.

## 2015-04-28 ENCOUNTER — Telehealth: Payer: Self-pay | Admitting: Family Medicine

## 2015-04-28 NOTE — Telephone Encounter (Signed)
I called to alert patient that I am yet to receive paper work from [facility] for her to obtain her air mattresses, wonder if they are faxing document to a wrong clinic. I gave her our fax number to give to [facility] agent and have them refax the paper work. She agreed with plan.

## 2015-04-28 NOTE — Telephone Encounter (Signed)
Pt calling back to let Dr. Gwendlyn Deutscher know she spoke with someone at [facility] and they do have everything they need and they are in the process of getting everything approved.

## 2015-04-28 NOTE — Telephone Encounter (Signed)
Will forward to PCP to acknowledge. Danile Trier, CMA.

## 2015-05-01 DIAGNOSIS — L89624 Pressure ulcer of left heel, stage 4: Secondary | ICD-10-CM | POA: Diagnosis not present

## 2015-05-01 DIAGNOSIS — I890 Lymphedema, not elsewhere classified: Secondary | ICD-10-CM | POA: Diagnosis not present

## 2015-05-01 DIAGNOSIS — L97511 Non-pressure chronic ulcer of other part of right foot limited to breakdown of skin: Secondary | ICD-10-CM | POA: Diagnosis not present

## 2015-05-01 DIAGNOSIS — J449 Chronic obstructive pulmonary disease, unspecified: Secondary | ICD-10-CM | POA: Diagnosis not present

## 2015-05-02 ENCOUNTER — Telehealth: Payer: Self-pay | Admitting: Neurology

## 2015-05-02 NOTE — Telephone Encounter (Signed)
Pt's brother called and stated that the pt has been having some health problems and they may need to cancel the 6/7 appt and r/s. Would like to speak with the nurse to see if there is any way they can be worked in during the following week. Please call and advise.

## 2015-05-02 NOTE — Telephone Encounter (Signed)
I called the patient. She stated that d/t Memorial day, she has to go to the wound care clinic on 6/6 and is unable to do 2 doctors appointments in the same week. She requested I change the appointment from 6/7 to the next available appointment after 3 PM. I explained that I could not offer her an appointment until October and she stated that was okay, she would like to take an appointment in October and remain on a waiting list. She gave me all of the dates of her wound care clinic appointments. I will call her if I have an available appointment after 3 PM on the weeks she is not going to the wound care clinic.

## 2015-05-04 ENCOUNTER — Other Ambulatory Visit: Payer: Self-pay | Admitting: Family Medicine

## 2015-05-04 DIAGNOSIS — Z7409 Other reduced mobility: Secondary | ICD-10-CM

## 2015-05-04 DIAGNOSIS — Z9189 Other specified personal risk factors, not elsewhere classified: Secondary | ICD-10-CM

## 2015-05-04 DIAGNOSIS — L8994 Pressure ulcer of unspecified site, stage 4: Secondary | ICD-10-CM

## 2015-05-04 NOTE — Telephone Encounter (Signed)
I included pressure ulcer, obesity, COPD in her diagnosis, what else do we need? Please have them send another form to [place] to complete. Not sure what other diagnosis to add at this time.

## 2015-05-04 NOTE — Telephone Encounter (Signed)
Kathleen Roy with [name] [facility] she cannot qualify the pt for the air mattress based on the diagnosis codes given She tried them all. She wasn't able to say what the next step would be Pt already gel overlay

## 2015-05-04 NOTE — Telephone Encounter (Signed)
Order printed and faxed to [facility].  Inda Mcglothen,CMA

## 2015-05-04 NOTE — Telephone Encounter (Signed)
Will forward to MD. [name],CMA  

## 2015-05-04 NOTE — Progress Notes (Signed)
Patient has been evaluated by me multiple times; she is morbidly obese and chair bound/moderately bedbound due to severely limited mobility without support from her brother or wheelchair. Due to her bedbound condition she developed a stage 4 decubitus ulcer of her left heel despite conservative measures such as repositioning.  She developed decubitus ulcer in March of 2016 (2 months ago), which progressively worsened requiring debridement and wound care consult. Patient has the risk of institutionalization in the absence of air mattress from poor wound healing or recurrent ulcer.  Conservative treatment tried so far for her ulcer includes wet to dry wound dressing, debridement, and frequent repositioning in bed and chair.  Patient will benefit tremendously from an air-mattress bed.

## 2015-05-09 ENCOUNTER — Encounter: Payer: Self-pay | Admitting: Neurology

## 2015-05-09 ENCOUNTER — Ambulatory Visit (INDEPENDENT_AMBULATORY_CARE_PROVIDER_SITE_OTHER): Payer: Commercial Managed Care - HMO | Admitting: Neurology

## 2015-05-09 VITALS — BP 117/69 | HR 88 | Ht 62.0 in

## 2015-05-09 DIAGNOSIS — R413 Other amnesia: Secondary | ICD-10-CM | POA: Diagnosis not present

## 2015-05-09 DIAGNOSIS — R5382 Chronic fatigue, unspecified: Secondary | ICD-10-CM | POA: Diagnosis not present

## 2015-05-09 DIAGNOSIS — R251 Tremor, unspecified: Secondary | ICD-10-CM

## 2015-05-09 NOTE — Patient Instructions (Signed)

## 2015-05-09 NOTE — Progress Notes (Signed)
Reason for visit: Tremor  Kathleen Roy is an 54 y.o. female  History of present illness:  Kathleen Roy is a 54 year old right-handed white female with a history of morbid obesity. The patient has undergone a sleep study in October 2015 revealing evidence of obstructive sleep apnea. The patient reports a mild memory issue that has been persistent since last seen. The patient has been placed on BiPAP, but she has not been able to tolerate the mask, and she is essentially not treating her sleep apnea. The patient has undergone MRI evaluation of the brain that was unremarkable, and blood work was unrevealing. The patient has developed a pressure sore on the left ankle, this is being treated through the wound center. The patient has significant depression issues, and she is on Abilify and taking trazodone and Effexor as well. She is seeking a psychiatrist to help manage her medications. She returns to this office for an evaluation.  Past Medical History  Diagnosis Date  . Normal echocardiogram 05/30/05    suboptimal study  . Obesity, morbid (more than 100 lbs over ideal weight or BMI > 40)     obese since childhood  . Fibromyalgia   . Hyperlipidemia   . Sepsis   . Depression   . Neuropathy   . Peripheral edema   . Memory disturbance   . Sleep apnea     "recently dx'd; haven't got my equipment yet" (11/08/2014)  . Headache     "at least weekly; it's usually when I first wake up" (11/08/2014)  . Migraines 1970's - <2000    "they just went away"  . Osteoarthritis   . Arthritis     "I feel like it's everywhere" (11/08/2014)  . Chronic lower back pain   . Pressure ulcer of foot     left    Past Surgical History  Procedure Laterality Date  . Total abdominal hysterectomy  1994  . Abdominal hernia repair  2002  . Appendectomy  1995  . Cholecystectomy  1995    Family History  Problem Relation Age of Onset  . Stroke Mother   . Dementia Father   . Congestive Heart Failure Brother   .  Diabetes Brother   . Hypertension Brother   . Prostate cancer Father     Social history:  reports that she quit smoking about 6 years ago. Her smoking use included Cigarettes. She has a 30 pack-year smoking history. She has never used smokeless tobacco. She reports that she does not drink alcohol or use illicit drugs.    Allergies  Allergen Reactions  . Aspirin Hives    REACTION: hives Hasn't had in years     Medications:  Prior to Admission medications   Medication Sig Start Date End Date Taking? Authorizing Provider  acetaminophen (TYLENOL) 500 MG tablet Take 1,000 mg by mouth every 6 (six) hours as needed for moderate pain (pain).   Yes Historical Provider, MD  albuterol (PROVENTIL HFA;VENTOLIN HFA) 108 (90 BASE) MCG/ACT inhaler Inhale 2 puffs into the lungs every 6 (six) hours as needed for wheezing or shortness of breath. 01/20/15  Yes [name], MD  ARIPiprazole (ABILIFY) 2 MG tablet Take 1 tablet (2 mg total) by mouth every other day. 12/01/14  Yes [name], MD  Ascorbic Acid (VITAMIN C) 1000 MG tablet Take 3,000 mg by mouth at bedtime.    Yes Historical Provider, MD  benzonatate (TESSALON) 100 MG capsule Take 1 capsule (100 mg total) by  mouth 3 (three) times daily as needed for cough. 03/21/15  Yes [name], MD  metolazone (ZAROXOLYN) 2.5 MG tablet Take 1 tablet (2.5 mg total) by mouth once a week. 01/23/15  Yes [name], MD  nystatin (MYCOSTATIN) powder Apply topically 2 (two) times daily. Apply to affected skin BID 02/22/15  Yes Alveda Reasons, MD  Omega-3 Fatty Acids (FISH OIL) 1000 MG CAPS Take 2 capsules by mouth at bedtime and may repeat dose one time if needed.    Yes Historical Provider, MD  oxyCODONE-acetaminophen (PERCOCET/ROXICET) 5-325 MG per tablet Take 1 tablet by mouth every 6 (six) hours as needed for moderate pain or severe pain. 04/25/15  Yes [name], MD  polyethylene glycol (MIRALAX / GLYCOLAX) packet Take 17 g by mouth at  bedtime.    Yes Historical Provider, MD  potassium chloride SA (KLOR-CON M20) 20 MEQ tablet Take 2 tablets (40 mEq total) by mouth daily. 03/15/15  Yes [name], MD  pravastatin (PRAVACHOL) 40 MG tablet Take 1 tablet (40 mg total) by mouth at bedtime. 04/05/15  Yes [name], MD  pregabalin (LYRICA) 150 MG capsule Take 1 capsule (150 mg total) by mouth 2 (two) times daily. 03/15/15  Yes [name], MD  PSYLLIUM PO Take 2 capsules by mouth at bedtime.   Yes Historical Provider, MD  torsemide (DEMADEX) 20 MG tablet Take 4 tablets (80 mg total) by mouth daily. 03/09/15  Yes [name], MD  traZODone (DESYREL) 150 MG tablet Take 1 tablet (150 mg total) by mouth 2 (two) times daily. 03/31/15  Yes [name], MD  triamterene-hydrochlorothiazide (MAXZIDE-25) 37.5-25 MG per tablet Take 1 tablet by mouth daily. 04/13/15  Yes [name], MD  Venlafaxine HCl 225 MG TB24 Take 1 tablet (225 mg total) by mouth daily. 07/05/14  Yes [name], MD    ROS:  Out of a complete 14 system review of symptoms, the patient complains only of the following symptoms, and all other reviewed systems are negative.  Weight gain Ringing in the ears Wheezing, shortness of breath Leg swelling Excessive thirst Sleep apnea, snoring Joint pain or back pain, muscle cramps, walking difficulty Pressure sore, left heel, itching Memory loss, dizziness, numbness, tremors Decreased concentration, depression, anxiety  Blood pressure 117/69, pulse 88, height 5\' 2"  (1.575 m).  Physical Exam  General: The patient is alert and cooperative at the time of the examination. The patient is morbidly obese.  Skin: 3+ edema below the knees is noted bilaterally.   Neurologic Exam  Mental status: The patient is alert and oriented x 3 at the time of the examination. The patient has apparent normal recent and remote memory, with an apparently normal attention span and concentration ability. Mini-Mental  Status Examination done today shows a total score of 30/30.   Cranial nerves: Facial symmetry is present. Speech is normal, no aphasia or dysarthria is noted. Extraocular movements are full. Visual fields are full.  Motor: The patient has good strength in all 4 extremities.  Sensory examination: Soft touch sensation is symmetric on the face, arms, and legs.  Coordination: The patient has good finger-nose-finger and heel-to-shin bilaterally. Mild asterixis is seen with the arms. There is an intention tremor with finger-nose-finger bilaterally.  Gait and station: The patient is able to stand up from the wheelchair. She is able to take several steps with minimal assistance, wide-based gait is noted.  Reflexes: Deep tendon reflexes are symmetric, but are  depressed.   Assessment/Plan:  1. Morbid obesity  2. Tremor  3. Sleep apnea  4. Anxiety and depression  5. Mild memory disturbance  The patient remains significantly obese. I have indicated that she should consider bariatric surgery for weight loss. The patient does not appear to be interested at this time. The patient does have a mild tremor, I would not add medication to treat this. The patient was given the names of several psychiatrists consider for medication management for her depression and anxiety. She will follow-up through this office on an as-needed basis. The untreated sleep apnea is the likely cause of the mild memory issue.  Jill Alexanders MD 05/09/2015 8:08 PM  [facility] [address] [place], [place] [address]  Phone [phone] Fax [phone]

## 2015-05-17 ENCOUNTER — Other Ambulatory Visit: Payer: Self-pay | Admitting: *Deleted

## 2015-05-18 MED ORDER — TORSEMIDE 20 MG PO TABS: 80.0000 mg | ORAL_TABLET | Freq: Every day | ORAL | Status: DC

## 2015-05-22 ENCOUNTER — Encounter (HOSPITAL_BASED_OUTPATIENT_CLINIC_OR_DEPARTMENT_OTHER): Payer: Commercial Managed Care - HMO | Attending: Plastic Surgery

## 2015-05-22 DIAGNOSIS — Z6841 Body Mass Index (BMI) 40.0 and over, adult: Secondary | ICD-10-CM | POA: Insufficient documentation

## 2015-05-22 DIAGNOSIS — L89624 Pressure ulcer of left heel, stage 4: Secondary | ICD-10-CM | POA: Insufficient documentation

## 2015-05-22 DIAGNOSIS — J449 Chronic obstructive pulmonary disease, unspecified: Secondary | ICD-10-CM | POA: Insufficient documentation

## 2015-05-22 DIAGNOSIS — G4733 Obstructive sleep apnea (adult) (pediatric): Secondary | ICD-10-CM | POA: Insufficient documentation

## 2015-05-22 DIAGNOSIS — Z9981 Dependence on supplemental oxygen: Secondary | ICD-10-CM | POA: Insufficient documentation

## 2015-05-23 ENCOUNTER — Ambulatory Visit: Payer: Commercial Managed Care - HMO | Admitting: Neurology

## 2015-05-24 ENCOUNTER — Telehealth: Payer: Self-pay | Admitting: Family Medicine

## 2015-05-24 DIAGNOSIS — G8929 Other chronic pain: Secondary | ICD-10-CM

## 2015-05-24 DIAGNOSIS — M549 Dorsalgia, unspecified: Principal | ICD-10-CM

## 2015-05-24 NOTE — Telephone Encounter (Signed)
Please inform patient her Oxycodone will be ready for pick up on Friday. She is due for refill then. Also I don't know what other code to use for her air mattress.

## 2015-05-24 NOTE — Telephone Encounter (Signed)
Needs to get RX for oxycodone Pt talked to someone from [place] and she had been denied the low pressure airmattress because of the diagnosis code Please advise

## 2015-05-24 NOTE — Telephone Encounter (Signed)
Patient is aware of this.  She will contact her insurance company to see what would qualify her for this air mattress. Ivo Moga,CMA

## 2015-05-24 NOTE — Telephone Encounter (Signed)
Will forward to preceptor for medication refill and also to PCP to make her aware of DME denial. Gabrella Stroh,CMA

## 2015-05-26 MED ORDER — OXYCODONE-ACETAMINOPHEN 5-325 MG PO TABS: 1.0000 | ORAL_TABLET | Freq: Four times a day (QID) | ORAL | Status: DC | PRN

## 2015-05-26 NOTE — Telephone Encounter (Signed)
Pleas inform patient, her script is ready for pick up.

## 2015-05-29 DIAGNOSIS — Z6841 Body Mass Index (BMI) 40.0 and over, adult: Secondary | ICD-10-CM | POA: Diagnosis not present

## 2015-05-29 DIAGNOSIS — Z9981 Dependence on supplemental oxygen: Secondary | ICD-10-CM | POA: Diagnosis not present

## 2015-05-29 DIAGNOSIS — J449 Chronic obstructive pulmonary disease, unspecified: Secondary | ICD-10-CM | POA: Diagnosis not present

## 2015-05-29 DIAGNOSIS — G4733 Obstructive sleep apnea (adult) (pediatric): Secondary | ICD-10-CM | POA: Diagnosis not present

## 2015-05-29 DIAGNOSIS — L89624 Pressure ulcer of left heel, stage 4: Secondary | ICD-10-CM | POA: Diagnosis present

## 2015-06-14 ENCOUNTER — Other Ambulatory Visit: Payer: Self-pay | Admitting: *Deleted

## 2015-06-15 MED ORDER — PREGABALIN 150 MG PO CAPS: 150.0000 mg | ORAL_CAPSULE | Freq: Two times a day (BID) | ORAL | Status: DC

## 2015-06-15 MED ORDER — POTASSIUM CHLORIDE CRYS ER 20 MEQ PO TBCR: 40.0000 meq | EXTENDED_RELEASE_TABLET | Freq: Every day | ORAL | Status: DC

## 2015-06-15 MED ORDER — ARIPIPRAZOLE 2 MG PO TABS: 2.0000 mg | ORAL_TABLET | ORAL | Status: DC

## 2015-06-16 ENCOUNTER — Telehealth: Payer: Self-pay | Admitting: Family Medicine

## 2015-06-16 NOTE — Telephone Encounter (Signed)
I got a letter about impending denial of HHA for patient. I called Dr Samuel Jester to discuss patient's case. He will review her case again and let us know if she get approved.

## 2015-06-22 ENCOUNTER — Other Ambulatory Visit: Payer: Self-pay | Admitting: Family Medicine

## 2015-06-22 DIAGNOSIS — G8929 Other chronic pain: Secondary | ICD-10-CM

## 2015-06-22 DIAGNOSIS — M549 Dorsalgia, unspecified: Principal | ICD-10-CM

## 2015-06-22 NOTE — Telephone Encounter (Signed)
Pt called and needs a refill on her Oxycodone. Please call when this is up front for pick up. jw

## 2015-06-23 MED ORDER — OXYCODONE-ACETAMINOPHEN 5-325 MG PO TABS: 1.0000 | ORAL_TABLET | Freq: Four times a day (QID) | ORAL | Status: DC | PRN

## 2015-06-23 NOTE — Telephone Encounter (Signed)
Provider spoke with patient and she is aware that script is ready for pick up. Laurelyn Terrero,CMA

## 2015-06-26 ENCOUNTER — Encounter (HOSPITAL_BASED_OUTPATIENT_CLINIC_OR_DEPARTMENT_OTHER): Payer: Commercial Managed Care - HMO | Attending: Plastic Surgery

## 2015-06-26 DIAGNOSIS — J449 Chronic obstructive pulmonary disease, unspecified: Secondary | ICD-10-CM | POA: Diagnosis not present

## 2015-06-26 DIAGNOSIS — I890 Lymphedema, not elsewhere classified: Secondary | ICD-10-CM | POA: Insufficient documentation

## 2015-06-26 DIAGNOSIS — L89624 Pressure ulcer of left heel, stage 4: Secondary | ICD-10-CM | POA: Insufficient documentation

## 2015-06-26 DIAGNOSIS — G4730 Sleep apnea, unspecified: Secondary | ICD-10-CM | POA: Insufficient documentation

## 2015-06-26 DIAGNOSIS — I10 Essential (primary) hypertension: Secondary | ICD-10-CM | POA: Insufficient documentation

## 2015-06-26 DIAGNOSIS — I739 Peripheral vascular disease, unspecified: Secondary | ICD-10-CM | POA: Diagnosis not present

## 2015-06-26 DIAGNOSIS — M1990 Unspecified osteoarthritis, unspecified site: Secondary | ICD-10-CM | POA: Diagnosis not present

## 2015-06-26 DIAGNOSIS — G629 Polyneuropathy, unspecified: Secondary | ICD-10-CM | POA: Insufficient documentation

## 2015-07-12 ENCOUNTER — Telehealth: Payer: Self-pay | Admitting: Family Medicine

## 2015-07-12 NOTE — Telephone Encounter (Signed)
I completed this form yesterday and placed it up front for faxing. Is Darrick Penna talking about another form? Please let her know. Thanks.

## 2015-07-12 NOTE — Telephone Encounter (Signed)
Will forward to MD to see if she has received this fax.  Denise Bramblett,CMA

## 2015-07-12 NOTE — Telephone Encounter (Signed)
Spoke with [facility] and they did receive form but it needs to be refaxed to [facility] directly.  Pulled form and refaxed to [employer]. [name],CMA

## 2015-07-12 NOTE — Telephone Encounter (Signed)
Shelly from [employer] is calling because she states that forms were faxed to the pt's PCP regarding medical supplies that just needed to be signed off. These were originally faxed on July 07, 2015 and she re-faxed them this morning. They need to be faxed to [facility] once completed, the fax number is on the form. She was just calling to make sure they have been received. Darrick Penna may be reached at [phone] ext: [phone]. Thank you, Fonda Kinder, ASA

## 2015-07-18 ENCOUNTER — Ambulatory Visit: Payer: Commercial Managed Care - HMO | Admitting: Family Medicine

## 2015-07-18 ENCOUNTER — Telehealth: Payer: Self-pay | Admitting: Family Medicine

## 2015-07-18 NOTE — Telephone Encounter (Signed)
Patient is aware of this. Heitor Steinhoff,CMA  

## 2015-07-18 NOTE — Telephone Encounter (Signed)
Pt's brother, Breelle Hollywood, calling to reschedule appt for today. He explains that she wouldn't be able to come in until the week after the next. Would like to know if she can have a refill on her oxycodone until then. Thank you, Fonda Kinder, ASA

## 2015-07-18 NOTE — Telephone Encounter (Signed)
Patient will be due for oxycodone refill and pick up on 07/24/15. Please call to inform her.

## 2015-07-24 ENCOUNTER — Telehealth: Payer: Self-pay | Admitting: Family Medicine

## 2015-07-24 ENCOUNTER — Encounter (HOSPITAL_BASED_OUTPATIENT_CLINIC_OR_DEPARTMENT_OTHER): Payer: Medicare Other | Attending: Plastic Surgery

## 2015-07-24 DIAGNOSIS — G4733 Obstructive sleep apnea (adult) (pediatric): Secondary | ICD-10-CM | POA: Insufficient documentation

## 2015-07-24 DIAGNOSIS — I890 Lymphedema, not elsewhere classified: Secondary | ICD-10-CM | POA: Diagnosis not present

## 2015-07-24 DIAGNOSIS — M1990 Unspecified osteoarthritis, unspecified site: Secondary | ICD-10-CM | POA: Diagnosis not present

## 2015-07-24 DIAGNOSIS — G629 Polyneuropathy, unspecified: Secondary | ICD-10-CM | POA: Diagnosis not present

## 2015-07-24 DIAGNOSIS — F419 Anxiety disorder, unspecified: Secondary | ICD-10-CM | POA: Insufficient documentation

## 2015-07-24 DIAGNOSIS — I10 Essential (primary) hypertension: Secondary | ICD-10-CM | POA: Insufficient documentation

## 2015-07-24 DIAGNOSIS — J449 Chronic obstructive pulmonary disease, unspecified: Secondary | ICD-10-CM | POA: Diagnosis not present

## 2015-07-24 DIAGNOSIS — M549 Dorsalgia, unspecified: Principal | ICD-10-CM

## 2015-07-24 DIAGNOSIS — L89624 Pressure ulcer of left heel, stage 4: Secondary | ICD-10-CM | POA: Diagnosis not present

## 2015-07-24 DIAGNOSIS — G8929 Other chronic pain: Secondary | ICD-10-CM

## 2015-07-24 MED ORDER — OXYCODONE-ACETAMINOPHEN 5-325 MG PO TABS
1.0000 | ORAL_TABLET | Freq: Four times a day (QID) | ORAL | Status: DC | PRN
Start: 2015-07-24 — End: 2015-08-25

## 2015-07-24 NOTE — Telephone Encounter (Signed)
Please advise patient her Oxycodone refill is ready for pick up at the front office. Thank you.

## 2015-07-24 NOTE — Telephone Encounter (Signed)
Pt was made aware that she could pick up today. Marx Doig,CMA

## 2015-07-26 ENCOUNTER — Other Ambulatory Visit: Payer: Self-pay | Admitting: *Deleted

## 2015-07-27 MED ORDER — VENLAFAXINE HCL ER 225 MG PO TB24: 1.0000 | ORAL_TABLET | Freq: Every day | ORAL | Status: DC

## 2015-08-01 ENCOUNTER — Ambulatory Visit: Payer: Commercial Managed Care - HMO | Admitting: Family Medicine

## 2015-08-09 ENCOUNTER — Ambulatory Visit: Payer: Commercial Managed Care - HMO | Admitting: Pulmonary Disease

## 2015-08-16 ENCOUNTER — Telehealth: Payer: Self-pay | Admitting: Pulmonary Disease

## 2015-08-16 DIAGNOSIS — G4733 Obstructive sleep apnea (adult) (pediatric): Secondary | ICD-10-CM

## 2015-08-16 NOTE — Telephone Encounter (Signed)
Called spoke with pt. She reports she is not able to afford the monthly cost for BIPAP ($50) and wants to turn this back in. She reports she also pays for O2 and her hospital bed that comes up to a total of $96/month. She reports she has not been wearing her BIPAP and when she did she only worn it for about 2-4 hrs a night. Please advise Dr. Halford Chessman thanks

## 2015-08-17 ENCOUNTER — Telehealth: Payer: Self-pay | Admitting: Family Medicine

## 2015-08-17 ENCOUNTER — Telehealth: Payer: Self-pay | Admitting: Pulmonary Disease

## 2015-08-17 ENCOUNTER — Ambulatory Visit: Payer: Commercial Managed Care - HMO | Admitting: Pulmonary Disease

## 2015-08-17 NOTE — Telephone Encounter (Signed)
Kathleen Roy, from [name] needs to talk to a nurse concerning this patient and medical supplies.  Please call him as soon as possible.

## 2015-08-17 NOTE — Telephone Encounter (Signed)
Called pt. Aware order placed to d/c BIPAP. Nothing further needed

## 2015-08-17 NOTE — Telephone Encounter (Signed)
Called spoke with Jeneen Rinks. He wanted to know if we saw pt for her COPD as well. I advised we did not, [facility] only saw pt for OSA. Nothing further needed

## 2015-08-17 NOTE — Telephone Encounter (Signed)
Please send order to have BiPAP machined d/c'ed.

## 2015-08-18 NOTE — Telephone Encounter (Signed)
She is on BiPAP for obstructive sleep apnea. She need approval from pulmonologist to change her treatment since I don't assess or make recommendation on CPAP or BiPAP setting. Please have them contact Pulmonologist and obtain a letter or note from them stating that they recommend the non-invasive ventilator.

## 2015-08-18 NOTE — Telephone Encounter (Signed)
Spoke with Jeneen Rinks at [facility] and patient is asking them to pick up her bypap for 2 reasons: 1-she can't afford the copay due to financial hardship and 2- she doesn't feel like it is helping.  He states that his company does offer assistance for financial hardship but the patient's copay has to be over $100.  Since she isn't tolerating the bypap well he suggested to her a different machine called a non-invasive ventilator.  States that this would make her copay over $100 and she will then qualify for financial help.  Also patient would need a current ABG or spirometry.  She would need to have a primary diagnosis of COPD and a secondary of CHRONIC respiratory failure per results.  States that he can come by after her visit on Tuesday to make sure the office note is worded properly for patient to get this help.  I informed him that per EPIC patient hasn't had any recent test for this.  Will forward to MD to make her aware of situation and to discuss with patient scheduling 1 or both of these tests.  [name],CMA

## 2015-08-22 ENCOUNTER — Ambulatory Visit: Payer: Commercial Managed Care - HMO | Admitting: Pulmonary Disease

## 2015-08-22 ENCOUNTER — Encounter: Payer: Self-pay | Admitting: Family Medicine

## 2015-08-22 ENCOUNTER — Ambulatory Visit (INDEPENDENT_AMBULATORY_CARE_PROVIDER_SITE_OTHER): Payer: Medicare Other | Admitting: Family Medicine

## 2015-08-22 VITALS — BP 115/59 | HR 91 | Temp 98.3°F | Ht 62.0 in | Wt 367.0 lb

## 2015-08-22 DIAGNOSIS — R21 Rash and other nonspecific skin eruption: Secondary | ICD-10-CM

## 2015-08-22 DIAGNOSIS — J449 Chronic obstructive pulmonary disease, unspecified: Secondary | ICD-10-CM | POA: Diagnosis not present

## 2015-08-22 DIAGNOSIS — Z23 Encounter for immunization: Secondary | ICD-10-CM | POA: Diagnosis not present

## 2015-08-22 DIAGNOSIS — L8990 Pressure ulcer of unspecified site, unspecified stage: Secondary | ICD-10-CM | POA: Diagnosis not present

## 2015-08-22 DIAGNOSIS — G4733 Obstructive sleep apnea (adult) (pediatric): Secondary | ICD-10-CM

## 2015-08-22 DIAGNOSIS — I10 Essential (primary) hypertension: Secondary | ICD-10-CM | POA: Diagnosis not present

## 2015-08-22 DIAGNOSIS — H538 Other visual disturbances: Secondary | ICD-10-CM | POA: Diagnosis not present

## 2015-08-22 DIAGNOSIS — E781 Pure hyperglyceridemia: Secondary | ICD-10-CM | POA: Insufficient documentation

## 2015-08-22 MED ORDER — HYDROCORTISONE VALERATE 0.2 % EX OINT
1.0000 | TOPICAL_OINTMENT | Freq: Two times a day (BID) | CUTANEOUS | Status: DC
Start: 2015-08-22 — End: 2016-06-12

## 2015-08-22 NOTE — Assessment & Plan Note (Addendum)
Diet and exercise counseling done. Air mattress ordered and will be faxed to [facility]. Will complete [employer] form which patient will need since her brother is going for heart surgery.

## 2015-08-22 NOTE — Progress Notes (Addendum)
Subjective:     Patient ID: Kathleen Roy, female   DOB: 02/12/1961, 54 y.o.   MRN: [id number]  HPI HTN/HLD:Here for follow up, she has been compliant with all medications, she denies side effects to her meds. COPD/OSA:Patient denies any recent flare up of her COPD. She has been compliant with her meds. For her OSA, she stated she has not been using her BiPAP often and due to cost this has been discontinued by her pulmonologist who recommended it. She stated she will qualify for financial assistant if she gets a CPAP. She continues to use her home O2 and will like a new refill from [facility]. Right eye issue:C/O feeling of lightening, feels like a flash light on her right eye on going for 1 wk on and off. No pain, but feels weakness of her right eye movement. No vision. No previous episode. She sees an optometric for her eye glasses prescription but will like a referral to Dr Zigmund Daniel. She denies eye trauma or injury recently. She feels ok today. Rash: She noticed a rash on the right side of her belly x 1 week which is now getting bigger. Rash is itchy at times but not painful. She denies a change in diet or body lotion.  Obesity: She requested another presciptio be faxed to [facility] for a air mattress. We did this in the past to [place] and she was denied. Patient also constantly need someone to help with her ADLs at home due to her weight, knee pain and breathing issues. Currently her brother is helping but he will soon be going for heart surgery after which she will have no one to help her. She need help with moving from the bed to the chair or even moving around the house.  Current Outpatient Prescriptions on File Prior to Visit  Medication Sig Dispense Refill  . acetaminophen (TYLENOL) 500 MG tablet Take 1,000 mg by mouth every 6 (six) hours as needed for moderate pain (pain).    Marland Kitchen albuterol (PROVENTIL HFA;VENTOLIN HFA) 108 (90 BASE) MCG/ACT inhaler Inhale 2 puffs into the lungs every 6 (six) hours as needed  for wheezing or shortness of breath. 18 g 3  . ARIPiprazole (ABILIFY) 2 MG tablet Take 1 tablet (2 mg total) by mouth every other day. 15 tablet 3  . Ascorbic Acid (VITAMIN C) 1000 MG tablet Take 3,000 mg by mouth at bedtime.     . benzonatate (TESSALON) 100 MG capsule Take 1 capsule (100 mg total) by mouth 3 (three) times daily as needed for cough. 20 capsule 0  . metolazone (ZAROXOLYN) 2.5 MG tablet Take 1 tablet (2.5 mg total) by mouth once a week. 12 tablet 1  . nystatin (MYCOSTATIN) powder Apply topically 2 (two) times daily. Apply to affected skin BID 360 g 2  . Omega-3 Fatty Acids (FISH OIL) 1000 MG CAPS Take 2 capsules by mouth at bedtime and may repeat dose one time if needed.     Marland Kitchen oxyCODONE-acetaminophen (PERCOCET/ROXICET) 5-325 MG per tablet Take 1 tablet by mouth every 6 (six) hours as needed for moderate pain or severe pain. 120 tablet 0  . polyethylene glycol (MIRALAX / GLYCOLAX) packet Take 17 g by mouth at bedtime.     . potassium chloride SA (KLOR-CON M20) 20 MEQ tablet Take 2 tablets (40 mEq total) by mouth daily. 60 tablet 3  . pravastatin (PRAVACHOL) 40 MG tablet Take 1 tablet (40 mg total) by mouth at bedtime. 90 tablet 1  . pregabalin (LYRICA)  150 MG capsule Take 1 capsule (150 mg total) by mouth 2 (two) times daily. 60 capsule 3  . PSYLLIUM PO Take 2 capsules by mouth at bedtime.    . torsemide (DEMADEX) 20 MG tablet Take 4 tablets (80 mg total) by mouth daily. 120 tablet 2  . traZODone (DESYREL) 150 MG tablet Take 1 tablet (150 mg total) by mouth 2 (two) times daily. 180 tablet 1  . triamterene-hydrochlorothiazide (MAXZIDE-25) 37.5-25 MG per tablet Take 1 tablet by mouth daily. 30 tablet 3  . Venlafaxine HCl 225 MG TB24 Take 1 tablet (225 mg total) by mouth daily. 90 tablet 2   No current facility-administered medications on file prior to visit.   Past Medical History  Diagnosis Date  . Normal echocardiogram 05/30/05    suboptimal study  . Obesity, morbid (more than  100 lbs over ideal weight or BMI > 40)     obese since childhood  . Fibromyalgia   . Hyperlipidemia   . Sepsis   . Depression   . Neuropathy   . Peripheral edema   . Memory disturbance   . Sleep apnea     "recently dx'd; haven't got my equipment yet" (11/08/2014)  . Headache     "at least weekly; it's usually when I first wake up" (11/08/2014)  . Migraines 1970's - <2000    "they just went away"  . Osteoarthritis   . Arthritis     "I feel like it's everywhere" (11/08/2014)  . Chronic lower back pain   . Pressure ulcer of foot     left     Review of Systems  Constitutional: Negative for activity change.       Obese  Respiratory: Negative.   Cardiovascular: Negative.   Gastrointestinal: Negative.   Genitourinary: Negative.   Skin: Positive for rash. Negative for wound.  Psychiatric/Behavioral: Negative.   All other systems reviewed and are negative.      Objective:   Physical Exam  Constitutional: She is oriented to person, place, and time. She appears well-developed. No distress.  Eyes: Conjunctivae and EOM are normal. Pupils are equal, round, and reactive to light. No scleral icterus. Pupils are equal.  Patient was discharge home before obtaining visual acuity but denies any vision loss.  Cardiovascular: Normal rate, regular rhythm, normal heart sounds and intact distal pulses.   No murmur heard. Pulmonary/Chest: Effort normal and breath sounds normal. No respiratory distress. She has no wheezes. She exhibits no tenderness.  Abdominal: Soft. Bowel sounds are normal. She exhibits no distension and no mass. There is no tenderness.  Musculoskeletal:  Puffy but non pitting pedal swelling.   Neurological: She is alert and oriented to person, place, and time.  Skin:     Psychiatric: She has a normal mood and affect. Her behavior is normal. Thought content normal.  Nursing note and vitals reviewed.      Assessment:     HTN/HLD: COPD: Right eye issue Rash      Plan:     Check problem list.

## 2015-08-22 NOTE — Assessment & Plan Note (Signed)
Rash not typical of candida intertrigo. Looks more like contact dermatitis. [place] prescribed. Return soon if no improvement.

## 2015-08-22 NOTE — Assessment & Plan Note (Signed)
I reviewed her telephone encounter from her pulmonologist who did her sleep study. It seems they discontinued her BiPAP without recommendation for CPAP. Advised to follow-up with pulmonologist to recommend CPAP setting and then I can place order for her to obtain it. Patient does not seem interesting in getting back on it for now.

## 2015-08-22 NOTE — Assessment & Plan Note (Signed)
Stable on fish oil. Recheck FLP next year.

## 2015-08-22 NOTE — Telephone Encounter (Signed)
Patient discussed options with pcp while in clinic today and they decided to have her see pulmonology before any changes.  Will call [facility] to inform. Harshith Pursell,CMA

## 2015-08-22 NOTE — Assessment & Plan Note (Addendum)
Home home O2. I will [facility] 2 L O2 daily as needed. She seems to be stable currently on home O2 and other medication regimen. Monitor on current regimen.

## 2015-08-22 NOTE — Patient Instructions (Signed)
Obesity Obesity is having too much body fat and a body mass index (BMI) of 30 or more. BMI is a number based on your height and weight. The number is an estimate of how much body fat you have. Obesity can happen if you eat more calories than you can burn by exercising or other activity. It can cause major health problems or emergencies.  HOME CARE  Exercise and be active as told by your doctor. Try:  Using stairs when you can.  Parking farther away from store doors.  Gardening, biking, or walking.  Eat healthy foods and drinks that are low in calories. Eat more fruits and vegetables.  Limit fast food, sweets, and snack foods that are made with ingredients that are not natural (processed food).  Eat smaller amounts of food.  Keep a journal and write down what you eat every day. Websites can help with this.  Avoid drinking alcohol. Drink more water and drinks without calories.   Take vitamins and dietary pills (supplements) only as told by your doctor.  Try going to weight-loss support groups or classes to help lessen stress. Dietitians and counselors may also help. GET HELP RIGHT AWAY IF:  You have chest pain or tightness.  You have trouble breathing or feel short of breath.  You feel weak or have loss of feeling (numbness) in your legs.  You feel confused or have trouble talking.  You have sudden changes in your vision. MAKE SURE YOU:  Understand these instructions.  Will watch your condition.  Will get help right away if you are not doing well or get worse. Document Released: 02/24/2012 Document Revised: 04/18/2014 Document Reviewed: 02/24/2012 [facility] Patient Information 2015 [place], [place]. This information is not intended to replace advice given to you by your health care provider. Make sure you discuss any questions you have with your health care provider.

## 2015-08-22 NOTE — Assessment & Plan Note (Signed)
BP looks good. Medications reviewed today. Continue current regimen since she is doing well on it.

## 2015-08-22 NOTE — Assessment & Plan Note (Signed)
Currently asymptomatic. No change in vision per patient although she has been experiencing symptoms x 1-2 wks now. Differentials includes retinal detachment, migraine headache or vitreous detachment. As discussed with patient I will like her to be seen by an ophthalmologist as soon as possible. Referral ordered and I spoke with our referral specialist who will work on her appointment scheduling today. Return precaution discussed. Go to the ED if symptoms worsens.

## 2015-08-25 ENCOUNTER — Other Ambulatory Visit: Payer: Self-pay | Admitting: Family Medicine

## 2015-08-25 DIAGNOSIS — M549 Dorsalgia, unspecified: Principal | ICD-10-CM

## 2015-08-25 DIAGNOSIS — G8929 Other chronic pain: Secondary | ICD-10-CM

## 2015-08-25 MED ORDER — OXYCODONE-ACETAMINOPHEN 5-325 MG PO TABS: 1.0000 | ORAL_TABLET | Freq: Four times a day (QID) | ORAL | Status: DC | PRN

## 2015-08-25 NOTE — Telephone Encounter (Signed)
Brother to pick up script today.

## 2015-08-28 ENCOUNTER — Encounter (HOSPITAL_BASED_OUTPATIENT_CLINIC_OR_DEPARTMENT_OTHER): Payer: Medicare Other

## 2015-09-01 ENCOUNTER — Telehealth: Payer: Self-pay | Admitting: Family Medicine

## 2015-09-01 ENCOUNTER — Encounter (INDEPENDENT_AMBULATORY_CARE_PROVIDER_SITE_OTHER): Payer: Medicare Other | Admitting: Ophthalmology

## 2015-09-01 DIAGNOSIS — H43813 Vitreous degeneration, bilateral: Secondary | ICD-10-CM | POA: Diagnosis not present

## 2015-09-01 DIAGNOSIS — I10 Essential (primary) hypertension: Secondary | ICD-10-CM

## 2015-09-01 DIAGNOSIS — H35033 Hypertensive retinopathy, bilateral: Secondary | ICD-10-CM

## 2015-09-01 NOTE — Telephone Encounter (Signed)
Will forward to MD but patient will likely need an appt to reevaluate this change in pain. [name],CMA

## 2015-09-01 NOTE — Telephone Encounter (Signed)
Will contact patient when I get back to the office next week.

## 2015-09-01 NOTE — Telephone Encounter (Signed)
Spoke with patient informed her of this.  She is fine with waiting until she hears from pcp.  Offered her an appt on Monday but this was declined at the time.  [name],CMA

## 2015-09-01 NOTE — Telephone Encounter (Signed)
Pt calling to report she is in a lot of pain, her current regimen of pain medication is not providing much relief, doesn't know what else to do, would like Dr. Gwendlyn Deutscher to call her. Pt will only be home until 12, she has an afternoon appt with the eye doctors. Pt was offered an appt today to be worked in but declined since she has another appt.

## 2015-09-04 ENCOUNTER — Telehealth: Payer: Self-pay | Admitting: Family Medicine

## 2015-09-04 NOTE — Telephone Encounter (Signed)
I spoke with patient this morning about 9am about her bed and the completion of her form from [facility] which I completed with her over the phone and placed it up front to be fax this morning. I am surprised she forgot she spoke with me this morning. I will complete additional orders requested and get in touch with the social worker about the personal care requested.

## 2015-09-04 NOTE — Telephone Encounter (Signed)
Spoke to patient and she has a few concerns: 1)  She states that [facility] where the order for her "low air mattress bed" was supposed to be sent.  They haven't received anything from the PCP as far as order, but did see in the note where this was discussed with patient.  Also they feel that she would benefit better from a "bariatric bed" or "pressure pumped" bed.  Patient states that she had this while in the hospital and it helped her a lot.  Patient would need an order for this plus more documentation in her note as to why she needs it and how it would help her.  The office note would need to be more detailed.  Also she would like to get her O2 with [facility] and needs an order sent over to them at [phone] with how many liters and her diagnosis code for oxygen.  Patient stated that if she was approved we would have to d/c her orders with [employer].    2)  She is requesting some personal care services especially for night and overnight.  She states that her caretaker is supposed to be having a heart catheter soon but is holding off since she would be home alone.  Patient said that PCP would speak with a Education officer, museum but hasn't heard anything from either of them.    Will forward to [place]

## 2015-09-04 NOTE — Telephone Encounter (Signed)
NB: I also discussed her pain medicine with her this morning. For now we will continue same dose of her percocet. I will reassess her at next visit for pain medicine adjustment. As discussed with her, weight loss will help alleviate a lot of her pain. I recommended weight loss class again to her but she stated she can not afford it.

## 2015-09-04 NOTE — Telephone Encounter (Signed)
Pt called and would like to speak to one of the nurses for Dr. Gwendlyn Deutscher. She said that she has some information about the bed coming from [facility]. She is also wanting to get her oxygen from them. jw

## 2015-09-05 ENCOUNTER — Telehealth: Payer: Self-pay | Admitting: Family Medicine

## 2015-09-05 NOTE — Telephone Encounter (Signed)
Pt called and would like to speak to Jazmin about some issues and also questions she has. jw

## 2015-09-05 NOTE — Telephone Encounter (Signed)
Spoke with patient and she states that [name] is no longer contracted with medicare and they will be picking up her bed on 09-19-2015.  They can move that date if patient hasn't received a new bed from [facility] yet.  Advised patient to check with [facility] on Friday to check status of orders that we faxed today. [name],CMA

## 2015-09-05 NOTE — Telephone Encounter (Signed)
Pt is aware.  Kathleen Roy,CMA  

## 2015-09-05 NOTE — Telephone Encounter (Signed)
Please advise patient. 1. Bariatric bed order was placed in the front office for faxing on the day I saw her and I also completed another form which I got from [facility] with her yesterday morning over the phone. 2. O2 Rx placed up front to be faxed to the number provided. 3. PCS form has been completed and placed up front for faxing.

## 2015-09-06 ENCOUNTER — Other Ambulatory Visit: Payer: Self-pay | Admitting: Family Medicine

## 2015-09-06 DIAGNOSIS — I10 Essential (primary) hypertension: Secondary | ICD-10-CM

## 2015-09-06 MED ORDER — ARIPIPRAZOLE 2 MG PO TABS: 2.0000 mg | ORAL_TABLET | ORAL | Status: DC

## 2015-09-06 MED ORDER — TRIAMTERENE-HCTZ 37.5-25 MG PO TABS: 1.0000 | ORAL_TABLET | Freq: Every day | ORAL | Status: DC

## 2015-09-06 NOTE — Telephone Encounter (Signed)
Pt called because she needs a refill on her Aripiprazole and she would like to see if she can get this in 90 day supply since it will be cheaper. Also needs a refill on her Triamterene and would also like this in 90 day supply. jw

## 2015-09-07 NOTE — Telephone Encounter (Signed)
Patient is aware that script has been called into the pharmacy. [name],CMA

## 2015-09-08 ENCOUNTER — Telehealth: Payer: Self-pay | Admitting: Family Medicine

## 2015-09-08 NOTE — Telephone Encounter (Signed)
Pt called and would like to speak to Jazmin. She said it was to complicated to go into and Jazmin would understand that. Blima Rich

## 2015-09-11 NOTE — Telephone Encounter (Signed)
Pt is calling to speak to Jazmin. jw

## 2015-09-14 ENCOUNTER — Other Ambulatory Visit: Payer: Self-pay | Admitting: Family Medicine

## 2015-09-14 ENCOUNTER — Telehealth: Payer: Self-pay | Admitting: Family Medicine

## 2015-09-14 MED ORDER — PREGABALIN 150 MG PO CAPS: 150.0000 mg | ORAL_CAPSULE | Freq: Two times a day (BID) | ORAL | Status: DC

## 2015-09-14 MED ORDER — TORSEMIDE 20 MG PO TABS: 40.0000 mg | ORAL_TABLET | Freq: Two times a day (BID) | ORAL | Status: DC

## 2015-09-14 MED ORDER — POTASSIUM CHLORIDE CRYS ER 20 MEQ PO TBCR: 40.0000 meq | EXTENDED_RELEASE_TABLET | Freq: Every day | ORAL | Status: DC

## 2015-09-14 NOTE — Telephone Encounter (Signed)
I received form and completed it today for heavy duty bariatric bed and placed it up front for faxing.

## 2015-09-14 NOTE — Telephone Encounter (Signed)
Pt called because she needs refills on her Potassium, Lyrica, and Toresmide. She would also like this is 90 day supply so it will be cheaper.jw

## 2015-09-14 NOTE — Telephone Encounter (Signed)
Please inform patient I completed her refill. Lyrica was placed up front for faxing since I am unable to e-prescribe it. Please have her come in soon for potassium recheck. Thanks.

## 2015-09-14 NOTE — Telephone Encounter (Signed)
I will complete form whenever I get it. I already placed an Rx for O2 faxed to [facility] sometimes last week or so.

## 2015-09-14 NOTE — Telephone Encounter (Signed)
Will forward to MD so she is aware of forms. Campbell Kray,CMA

## 2015-09-14 NOTE — Telephone Encounter (Signed)
Call Documentation      Kathleen Roy at 09/14/2015 2:18 PM     Status: Signed       Expand All Collapse All   Pt called because she needs refills on her Potassium, Lyrica, and Toresmide. She would also like this is 90 day supply so it will be cheaper.jw               Encounter MyChart Messages     No messages in this encounter     Routing History     Priority Sent On From To Message Type     09/14/2015 4:09 PM Jazmin Germain Osgood, CMA [name], MD Patient Calls     09/14/2015 2:20 PM Kathleen Roy [facility] Patient Calls

## 2015-09-14 NOTE — Telephone Encounter (Signed)
Corene Cornea from [facility] called and he is faxing [place] new forms to sign. The patient needs a heavy duty bed and also heavy duty gel overlay for the bed. The original orders will not work for the patient because of her weight. The patient is also requesting that we transfer her her orders for Oxygen to [facility] instead of [facility]. If you have any question please call Corene Cornea at [phone] ext [phone] and please fax new orders back to [phone]. jw

## 2015-09-15 ENCOUNTER — Telehealth: Payer: Self-pay | Admitting: Family Medicine

## 2015-09-15 ENCOUNTER — Other Ambulatory Visit: Payer: Self-pay | Admitting: Family Medicine

## 2015-09-15 DIAGNOSIS — E662 Morbid (severe) obesity with alveolar hypoventilation: Secondary | ICD-10-CM

## 2015-09-15 DIAGNOSIS — J449 Chronic obstructive pulmonary disease, unspecified: Secondary | ICD-10-CM

## 2015-09-15 NOTE — Progress Notes (Signed)
Ok.  Will forward to [employer], CSW so she can contact [employer] with [facility].  I also faxed order for bed again to them at [place]

## 2015-09-15 NOTE — Telephone Encounter (Signed)
Refaxed form since [facility] said it was received.  Also placed o2 order in epic for them.  [name],CMA

## 2015-09-15 NOTE — Telephone Encounter (Signed)
Spoke with patient regarding orders for bed and oxygen.  Form will be faxed from [facility] to have [place] sign the orders.  Also will look for oxygen order to be faxed back to them.  Pt voiced understanding.  Please see follow up phone note. [name],CMA

## 2015-09-15 NOTE — Telephone Encounter (Signed)
refaxed form to [facility] and o2 order was placed in epic for [facility] to see and pull.  [name],CMA

## 2015-09-15 NOTE — Progress Notes (Signed)
[name] with [facility] informed of orders pending for a hospital bed and O2.  Hunt Oris, [name], [place]

## 2015-09-15 NOTE — Telephone Encounter (Signed)
[facility] suppose to be getting an order for a hospital bed.  Talked to  [facility] and they said they haven't received anything.  All is needed on the form for the bed is her signature.  Will however need to have more information completed on the form for the oxygen.

## 2015-09-15 NOTE — Telephone Encounter (Signed)
Spoke with patient and she is aware of medication refills. Kathleen Roy,CMA

## 2015-09-15 NOTE — Telephone Encounter (Signed)
Medication were in twice for refills.  Refilled on 09-14-15 by other means. [name],CMA

## 2015-09-15 NOTE — Progress Notes (Signed)
O2 order placed.

## 2015-09-18 ENCOUNTER — Telehealth: Payer: Self-pay | Admitting: *Deleted

## 2015-09-18 NOTE — Telephone Encounter (Signed)
-----   Message from [name] sent at 09/15/2015  5:51 PM EDT ----- Regarding: RE: Pt needing DME: bed & o2 Got them. We will be faxing a narrative note that [facility] requires for the bed. Thank you! ----- Message -----    From: Hunt Oris, [name]    Sent: 09/15/2015   4:22 PM      To: Melissa Stenson, Johnnye Sandford M Edilson Vital, CMA Subject: Pt needing DME: bed & o2                       Hi Melissa,  Our nurse has been faxing a DME order for a hospital bed but apparently [facility] has said they have not received it. The order is in the pts chart alone with an O2 order. Can you take care of this for the pt? Please reply all with any questions as today might be my last day in clinic.  Thank you!! Constance Holster

## 2015-09-19 ENCOUNTER — Encounter: Payer: Self-pay | Admitting: Family Medicine

## 2015-09-19 ENCOUNTER — Telehealth: Payer: Self-pay | Admitting: Family Medicine

## 2015-09-19 NOTE — Telephone Encounter (Signed)
Letter placed up front for faxing.

## 2015-09-19 NOTE — Telephone Encounter (Signed)
Pt called and said that the doctor needs to write a letter to [employer] to discontinue bring her Oxygen since she is now going to be getting this from [facility]. Please fax this to [phone]. jw

## 2015-09-19 NOTE — Telephone Encounter (Signed)
Will forward to MD. [name],CMA  

## 2015-09-21 ENCOUNTER — Telehealth: Payer: Self-pay | Admitting: Family Medicine

## 2015-09-21 NOTE — Telephone Encounter (Signed)
Pt called and would like to speak to Kathleen Roy about her Potassium . jw

## 2015-09-21 NOTE — Telephone Encounter (Signed)
Spoke with patient and she states that the next time she comes in for her visit in January, she would like to get her labs then.  She states that the last 3 times she has had to leave home for an appt she had to call 911 to come out and help her get inside her home.  She has a hard time getting up the steps and would hate to do this for a lab only appt.  She has enough medication to last for 3 months but just wants to see if when she is up for a refill if you will do it until her appt?  Levaughn Puccinelli,CMA

## 2015-09-21 NOTE — Telephone Encounter (Signed)
I will recommend she checks her potassium soon, but if she prefer's to wait till later, I will not mandate it.

## 2015-09-22 ENCOUNTER — Telehealth: Payer: Self-pay | Admitting: Family Medicine

## 2015-09-22 NOTE — Telephone Encounter (Signed)
Need to discuss higher dosage of the oxycodone or something stronger.  Want to talk about before getting refill.

## 2015-09-25 ENCOUNTER — Telehealth: Payer: Self-pay | Admitting: Family Medicine

## 2015-09-25 ENCOUNTER — Other Ambulatory Visit: Payer: Self-pay | Admitting: Family Medicine

## 2015-09-25 DIAGNOSIS — G8929 Other chronic pain: Secondary | ICD-10-CM

## 2015-09-25 DIAGNOSIS — M549 Dorsalgia, unspecified: Principal | ICD-10-CM

## 2015-09-25 MED ORDER — OXYCODONE-ACETAMINOPHEN 10-325 MG PO TABS: 1.0000 | ORAL_TABLET | Freq: Three times a day (TID) | ORAL | Status: DC | PRN

## 2015-09-25 NOTE — Telephone Encounter (Signed)
Will forward to MD to advise. [name],CMA  

## 2015-09-25 NOTE — Telephone Encounter (Signed)
Please advise patient script is ready for pick up.

## 2015-09-25 NOTE — Telephone Encounter (Signed)
Need to speak with you about the medication for pain.

## 2015-09-25 NOTE — Telephone Encounter (Signed)
Please advise patient since her pain is not well controlled on the current dose of Oxycodone, Am afraid she might have developed tolerance to it. I can increase her dose from 5 mg to 10 mg with reduced frequency from every 6 hours to every 8 hrs.  If this does not work for her she might need referral to a pain specialist. I worry about the impact of opioid agent on her.

## 2015-09-25 NOTE — Telephone Encounter (Signed)
Please advise patient her script is ready for pick up.  Valerie Roys, CMA at 09/25/2015 10:44 AM     Status: Signed       Expand All Collapse All   Spoke with patient and she is fine with changing the dose from 5mg  to 10mg  and understood that this would also change the frequency. She would like a script for the new dose. [name],CMA             [name], MD at 09/25/2015 7:20 AM     Status: Signed       Expand All Collapse All   Please advise patient since her pain is not well controlled on the current dose of Oxycodone, Am afraid she might have developed tolerance to it. I can increase her dose from 5 mg to 10 mg with reduced frequency from every 6 hours to every 8 hrs.  If this does not work for her she might need referral to a pain specialist. I worry about the impact of opioid agent on her.            Eusebio Friendly at 09/22/2015 2:09 PM     Status: Signed       Expand All Collapse All   Need to discuss higher dosage of the oxycodone or something stronger. Want to talk about before getting refill.

## 2015-09-25 NOTE — Telephone Encounter (Signed)
[facility]. Needs to know how much oxygen pt should be receiving . Please call [facility]

## 2015-09-25 NOTE — Telephone Encounter (Signed)
2L Oxygen. Please call to inform [facility].

## 2015-09-25 NOTE — Telephone Encounter (Signed)
Spoke with patient and she is fine with changing the dose from 5mg  to 10mg  and understood that this would also change the frequency.  She would like a script for the new dose.  Abdoulie Tierce,CMA

## 2015-09-25 NOTE — Telephone Encounter (Signed)
Patient is aware that script is ready for pick up.  She would like to know if you would be ok with her taking it every 6 hours vs every 8 so she doesn't have to wake up from her sleep to take a dose?  Please advise. [name],CMA

## 2015-09-25 NOTE — Telephone Encounter (Signed)
No I will not be ok with that. The plan as previously discussed is to increase the dose from 5 mg to 10 mg with reduced frequency to reduce side effects and she agreed on that.

## 2015-09-26 NOTE — Telephone Encounter (Signed)
Has been on 4L for almost a year. Is surprised she is 0nly getting 2L Please give the pt a call to explain the difference

## 2015-09-26 NOTE — Telephone Encounter (Signed)
Spoke with [facility] and they need the order for oxygen faxed over with # of liters she is on. [employer]

## 2015-09-26 NOTE — Telephone Encounter (Signed)
Per patient's may 2016 office visit she was on 4L of oxygen when she came into office.  Will forward to MD to see if patient can continue on this.  She and brother are concerned that lessening her amount will affect her breathing at night.  Sanaa Zilberman,CMA

## 2015-09-27 ENCOUNTER — Telehealth: Payer: Self-pay | Admitting: Family Medicine

## 2015-09-27 ENCOUNTER — Other Ambulatory Visit: Payer: Self-pay | Admitting: Family Medicine

## 2015-09-27 DIAGNOSIS — G4733 Obstructive sleep apnea (adult) (pediatric): Secondary | ICD-10-CM

## 2015-09-27 DIAGNOSIS — J449 Chronic obstructive pulmonary disease, unspecified: Secondary | ICD-10-CM

## 2015-09-27 DIAGNOSIS — E662 Morbid (severe) obesity with alveolar hypoventilation: Secondary | ICD-10-CM

## 2015-09-27 NOTE — Telephone Encounter (Signed)
She may go back to 4 L, please inform [facility].

## 2015-09-27 NOTE — Telephone Encounter (Signed)
Done

## 2015-09-27 NOTE — Telephone Encounter (Signed)
Calling back about oxycodone medication.  Need directions

## 2015-09-27 NOTE — Telephone Encounter (Signed)
Order printed and faxed to [facility] [phone]. [name],CMA

## 2015-09-27 NOTE — Telephone Encounter (Signed)
Spoke with patient regarding her medication instructions.  I informed her of message from MD and patient voiced understanding. Eliz Nigg,CMA

## 2015-09-27 NOTE — Telephone Encounter (Signed)
Spoke with Shanon Brow with [facility] and they will need a new order stating patient's new amount of oxygen is 4L.  Please place in Epic and I will print it and fax it to them today.  Thanks. Ernesteen Mihalic,CMA

## 2015-10-03 ENCOUNTER — Ambulatory Visit: Payer: Self-pay | Admitting: Neurology

## 2015-10-04 ENCOUNTER — Other Ambulatory Visit: Payer: Self-pay | Admitting: *Deleted

## 2015-10-05 MED ORDER — PRAVASTATIN SODIUM 40 MG PO TABS: 40.0000 mg | ORAL_TABLET | Freq: Every day | ORAL | Status: DC

## 2015-10-05 MED ORDER — TRAZODONE HCL 150 MG PO TABS: 150.0000 mg | ORAL_TABLET | Freq: Two times a day (BID) | ORAL | Status: DC

## 2015-10-23 ENCOUNTER — Telehealth: Payer: Self-pay | Admitting: Family Medicine

## 2015-10-23 NOTE — Telephone Encounter (Signed)
Patient plans to speak with brother while physicians are in room.  Gunnar Hereford,CMA

## 2015-10-23 NOTE — Telephone Encounter (Signed)
Emilea Goga is on patient's release of information scanned on 01/19/14 with brother Nare Gaspari.  Ok to give this information. Moyinoluwa Dawe,CMA

## 2015-10-23 NOTE — Telephone Encounter (Signed)
I can give exact timeline, it depends on how he does overnight. Ideally we need to check with his brother to know how much information to give her, but am sure this should not be a problem.

## 2015-10-23 NOTE — Telephone Encounter (Signed)
Pt called and would like to speak to the doctor about her husband and when he is going to be released. She said she is doing really bad and can not take the stress. jw

## 2015-10-23 NOTE — Telephone Encounter (Signed)
Spoke with patient and she is aware of this.  She is worried because she hasn't been able to take her fluid pill since she can't get up and down to the bathroom alone.  Will send message to MD so maybe there might be an exact timeline as to when the patient will be released.  [name],CMA

## 2015-10-23 NOTE — Telephone Encounter (Signed)
Patient is not Married. Her brother is admitted and we are hoping he will be able to go home tomorrow. Please advise patient.

## 2015-10-25 ENCOUNTER — Other Ambulatory Visit: Payer: Self-pay | Admitting: Family Medicine

## 2015-10-25 ENCOUNTER — Telehealth: Payer: Self-pay | Admitting: Family Medicine

## 2015-10-25 MED ORDER — OXYCODONE-ACETAMINOPHEN 10-325 MG PO TABS: 1.0000 | ORAL_TABLET | Freq: Three times a day (TID) | ORAL | Status: DC | PRN

## 2015-10-25 NOTE — Telephone Encounter (Signed)
Patient is aware of this. Shontae Rosiles,CMA  

## 2015-10-25 NOTE — Telephone Encounter (Signed)
Please let her know her Oxy is ready for pick up today.

## 2015-10-25 NOTE — Telephone Encounter (Signed)
Pt is calling for a refill on her Oxycodone. Please let her know when she can pick this up. jw

## 2015-10-26 ENCOUNTER — Other Ambulatory Visit: Payer: Self-pay | Admitting: Family Medicine

## 2015-10-26 ENCOUNTER — Telehealth: Payer: Self-pay | Admitting: Family Medicine

## 2015-10-26 MED ORDER — NYSTATIN 100000 UNIT/GM EX POWD: Freq: Two times a day (BID) | CUTANEOUS | Status: DC

## 2015-10-26 NOTE — Telephone Encounter (Signed)
Please advise patient, her refill for Nystatin has been done. Also talk about considering OTC Dusting powder instead of Nystatin. She might develop resistant to this agent if she continues to use it.

## 2015-10-26 NOTE — Telephone Encounter (Signed)
Pt calling re: her nystatin powder, says she normally gets 6 refills but this time only received 2, pt says she normally goes through a bottle every week, wants to know if MD will add 4 more refills to it, making it 6 total. Pt goes to [facility]

## 2015-10-26 NOTE — Telephone Encounter (Signed)
Will forward to MD to see if we can just call in more refills for patient's powder.  [name],CMA

## 2015-10-27 NOTE — Telephone Encounter (Signed)
Patient is aware of this.  She will try using regular powder to help with dryness during the times that she doesn't have a lot of trouble. Kathleen Roy,CMA

## 2015-10-31 ENCOUNTER — Telehealth: Payer: Self-pay | Admitting: Family Medicine

## 2015-10-31 NOTE — Telephone Encounter (Signed)
Pt called and would like to speak to Jazmin. jw

## 2015-11-01 NOTE — Telephone Encounter (Signed)
It will not be done. That is called insurance fraud.

## 2015-11-01 NOTE — Telephone Encounter (Signed)
Spoke with patient and she has a few concerns regarding her bedside commode.  Patient states that her current commode is starting to crack and would like to get a new one.  She said that she would like to get a new order placed for one but that it wouldn't be covered by her insurance since they just shipped one last year.  Medicaid will only cover 1 chair every 5 years.  States that she was shipped 1 last year but was unable to use it to due it not fitting properly in the space she needed it.  Patient states that she never used it and had asked for it to be returned but it was denied due to "touching the linoleum" and considered used.  Patient states that she can't afford $202 OOP for this but was informed that there may a way around this.  It is catalog [phone].  This chair would accommodate 300-1000lbs.  Per patient she would like to know if you would be willing to write this order in her brother's name, to ensure better insurance coverage.   States that there would have to be some additional documentation as to why he would need this more durable chair.  Will forward to MD, but unsure if this would be done.   [name],CMA

## 2015-11-02 ENCOUNTER — Other Ambulatory Visit: Payer: Self-pay | Admitting: Family Medicine

## 2015-11-02 DIAGNOSIS — G4733 Obstructive sleep apnea (adult) (pediatric): Secondary | ICD-10-CM

## 2015-11-02 NOTE — Telephone Encounter (Signed)
catalog [phone]. This chair would accommodate 300-1000lbs.

## 2015-11-02 NOTE — Telephone Encounter (Signed)
Spoke with patient and she voiced understanding.  She would like for [place] to just send an order for this specific bedside commode for her to [facility] and see what the process would be for her to get it.

## 2015-11-02 NOTE — Telephone Encounter (Signed)
Order completed and placed up front to be faxed.

## 2015-11-15 ENCOUNTER — Telehealth: Payer: Self-pay | Admitting: Family Medicine

## 2015-11-15 MED ORDER — VENLAFAXINE HCL ER 150 MG PO TB24: 225.0000 mg | ORAL_TABLET | Freq: Every day | ORAL | Status: DC

## 2015-11-15 NOTE — Telephone Encounter (Signed)
Letter from [facility]. Venlafaxine 225 mg will not be covered as of 2017. Preferred drug is Venlafaxine cap 150 mg ER. I will switch patient to this.  She is currently on Effexor 225mg  qd, I will switch to Effexor 150 mg 1.5 tab daily which she had been on in the past.

## 2015-11-20 ENCOUNTER — Telehealth: Payer: Self-pay | Admitting: *Deleted

## 2015-11-20 NOTE — Telephone Encounter (Signed)
She will n eed to wait till Friday to pick up script.

## 2015-11-20 NOTE — Telephone Encounter (Signed)
Patient says her oxycodone is due to be refill on 12/10. Patients brother Anabella Yeats has an appointment with Dr. Gwendlyn Deutscher tomm and patient would like to know if MD will refill her rx for her brother to pick up at his appointment tomorrow.

## 2015-11-20 NOTE — Telephone Encounter (Signed)
Patient is aware of this and voiced understanding. Kathleen Roy,CMA

## 2015-11-21 ENCOUNTER — Other Ambulatory Visit: Payer: Self-pay | Admitting: Family Medicine

## 2015-11-21 MED ORDER — OXYCODONE-ACETAMINOPHEN 10-325 MG PO TABS: 1.0000 | ORAL_TABLET | Freq: Three times a day (TID) | ORAL | Status: DC | PRN

## 2015-11-21 NOTE — Telephone Encounter (Signed)
Medication refill due in 3 days. Her brother is here to pick up her meds.

## 2015-12-14 ENCOUNTER — Other Ambulatory Visit: Payer: Self-pay | Admitting: *Deleted

## 2015-12-14 DIAGNOSIS — I10 Essential (primary) hypertension: Secondary | ICD-10-CM

## 2015-12-14 MED ORDER — POTASSIUM CHLORIDE CRYS ER 20 MEQ PO TBCR: 40.0000 meq | EXTENDED_RELEASE_TABLET | Freq: Every day | ORAL | Status: DC

## 2015-12-14 MED ORDER — TRIAMTERENE-HCTZ 37.5-25 MG PO TABS: 1.0000 | ORAL_TABLET | Freq: Every day | ORAL | Status: DC

## 2015-12-26 ENCOUNTER — Other Ambulatory Visit: Payer: Self-pay | Admitting: Family Medicine

## 2015-12-26 MED ORDER — OXYCODONE-ACETAMINOPHEN 10-325 MG PO TABS: 1.0000 | ORAL_TABLET | Freq: Three times a day (TID) | ORAL | Status: DC | PRN

## 2015-12-26 NOTE — Telephone Encounter (Signed)
Spoke with patient and she is aware that script is ready for pick up. Kathleen Roy,CMA

## 2015-12-26 NOTE — Telephone Encounter (Signed)
Please let patient know her script is ready for pick up at the front office

## 2015-12-26 NOTE — Telephone Encounter (Signed)
Oxycodone runs out on the 12. Would like a refill.  Has an appt on Jan 24.

## 2015-12-29 ENCOUNTER — Ambulatory Visit: Payer: Medicare Other | Admitting: Family Medicine

## 2016-01-09 ENCOUNTER — Ambulatory Visit: Payer: Medicare Other | Admitting: Family Medicine

## 2016-01-15 ENCOUNTER — Emergency Department (HOSPITAL_COMMUNITY): Payer: Medicare Other

## 2016-01-15 ENCOUNTER — Encounter (HOSPITAL_COMMUNITY): Payer: Self-pay | Admitting: *Deleted

## 2016-01-15 ENCOUNTER — Inpatient Hospital Stay (HOSPITAL_COMMUNITY)
Admission: EM | Admit: 2016-01-15 | Discharge: 2016-01-17 | DRG: 190 | Disposition: A | Discharge status: Home / Self Care | Payer: Medicare Other | Attending: Family Medicine | Admitting: Family Medicine

## 2016-01-15 DIAGNOSIS — Z23 Encounter for immunization: Secondary | ICD-10-CM

## 2016-01-15 DIAGNOSIS — J189 Pneumonia, unspecified organism: Secondary | ICD-10-CM | POA: Diagnosis present

## 2016-01-15 DIAGNOSIS — E874 Mixed disorder of acid-base balance: Secondary | ICD-10-CM | POA: Diagnosis not present

## 2016-01-15 DIAGNOSIS — Z7401 Bed confinement status: Secondary | ICD-10-CM

## 2016-01-15 DIAGNOSIS — E1165 Type 2 diabetes mellitus with hyperglycemia: Secondary | ICD-10-CM | POA: Diagnosis present

## 2016-01-15 DIAGNOSIS — J9622 Acute and chronic respiratory failure with hypercapnia: Secondary | ICD-10-CM | POA: Insufficient documentation

## 2016-01-15 DIAGNOSIS — R404 Transient alteration of awareness: Secondary | ICD-10-CM | POA: Diagnosis not present

## 2016-01-15 DIAGNOSIS — F339 Major depressive disorder, recurrent, unspecified: Secondary | ICD-10-CM | POA: Diagnosis present

## 2016-01-15 DIAGNOSIS — M797 Fibromyalgia: Secondary | ICD-10-CM | POA: Diagnosis present

## 2016-01-15 DIAGNOSIS — Z6841 Body Mass Index (BMI) 40.0 and over, adult: Secondary | ICD-10-CM

## 2016-01-15 DIAGNOSIS — L8990 Pressure ulcer of unspecified site, unspecified stage: Secondary | ICD-10-CM | POA: Insufficient documentation

## 2016-01-15 DIAGNOSIS — R0789 Other chest pain: Secondary | ICD-10-CM | POA: Diagnosis present

## 2016-01-15 DIAGNOSIS — G8929 Other chronic pain: Secondary | ICD-10-CM | POA: Diagnosis present

## 2016-01-15 DIAGNOSIS — M545 Low back pain: Secondary | ICD-10-CM | POA: Diagnosis present

## 2016-01-15 DIAGNOSIS — E1142 Type 2 diabetes mellitus with diabetic polyneuropathy: Secondary | ICD-10-CM | POA: Diagnosis present

## 2016-01-15 DIAGNOSIS — Z87891 Personal history of nicotine dependence: Secondary | ICD-10-CM

## 2016-01-15 DIAGNOSIS — I5032 Chronic diastolic (congestive) heart failure: Secondary | ICD-10-CM | POA: Insufficient documentation

## 2016-01-15 DIAGNOSIS — F32A Depression, unspecified: Secondary | ICD-10-CM | POA: Insufficient documentation

## 2016-01-15 DIAGNOSIS — E662 Morbid (severe) obesity with alveolar hypoventilation: Secondary | ICD-10-CM | POA: Diagnosis present

## 2016-01-15 DIAGNOSIS — R05 Cough: Secondary | ICD-10-CM | POA: Diagnosis not present

## 2016-01-15 DIAGNOSIS — L89891 Pressure ulcer of other site, stage 1: Secondary | ICD-10-CM | POA: Diagnosis present

## 2016-01-15 DIAGNOSIS — J440 Chronic obstructive pulmonary disease with acute lower respiratory infection: Principal | ICD-10-CM | POA: Diagnosis present

## 2016-01-15 DIAGNOSIS — I10 Essential (primary) hypertension: Secondary | ICD-10-CM | POA: Diagnosis present

## 2016-01-15 DIAGNOSIS — I872 Venous insufficiency (chronic) (peripheral): Secondary | ICD-10-CM | POA: Diagnosis present

## 2016-01-15 DIAGNOSIS — Z9981 Dependence on supplemental oxygen: Secondary | ICD-10-CM

## 2016-01-15 DIAGNOSIS — J111 Influenza due to unidentified influenza virus with other respiratory manifestations: Secondary | ICD-10-CM | POA: Diagnosis not present

## 2016-01-15 DIAGNOSIS — R531 Weakness: Secondary | ICD-10-CM | POA: Diagnosis not present

## 2016-01-15 DIAGNOSIS — R0600 Dyspnea, unspecified: Secondary | ICD-10-CM

## 2016-01-15 DIAGNOSIS — I452 Bifascicular block: Secondary | ICD-10-CM | POA: Diagnosis present

## 2016-01-15 DIAGNOSIS — F329 Major depressive disorder, single episode, unspecified: Secondary | ICD-10-CM | POA: Insufficient documentation

## 2016-01-15 DIAGNOSIS — R0602 Shortness of breath: Secondary | ICD-10-CM | POA: Diagnosis not present

## 2016-01-15 DIAGNOSIS — M1990 Unspecified osteoarthritis, unspecified site: Secondary | ICD-10-CM | POA: Diagnosis present

## 2016-01-15 DIAGNOSIS — Z9119 Patient's noncompliance with other medical treatment and regimen: Secondary | ICD-10-CM

## 2016-01-15 DIAGNOSIS — J9621 Acute and chronic respiratory failure with hypoxia: Secondary | ICD-10-CM | POA: Diagnosis present

## 2016-01-15 DIAGNOSIS — I878 Other specified disorders of veins: Secondary | ICD-10-CM | POA: Diagnosis present

## 2016-01-15 HISTORY — DX: Reserved for inherently not codable concepts without codable children: IMO0001

## 2016-01-15 HISTORY — DX: Chronic obstructive pulmonary disease, unspecified: J44.9

## 2016-01-15 HISTORY — DX: Essential (primary) hypertension: I10

## 2016-01-15 LAB — I-STAT VENOUS BLOOD GAS, ED
Acid-Base Excess: 23 mmol/L — ABNORMAL HIGH (ref 0.0–2.0)
BICARBONATE: 52.5 meq/L — AB (ref 20.0–24.0)
O2 Saturation: 36 %
PCO2 VEN: 79.6 mmHg — AB (ref 45.0–50.0)
PH VEN: 7.427 — AB (ref 7.250–7.300)
PO2 VEN: 23 mmHg — AB (ref 30.0–45.0)

## 2016-01-15 LAB — BASIC METABOLIC PANEL
Anion gap: 14 (ref 5–15)
BUN: 17 mg/dL (ref 6–20)
CHLORIDE: 88 mmol/L — AB (ref 101–111)
CO2: 39 mmol/L — ABNORMAL HIGH (ref 22–32)
Calcium: 9.8 mg/dL (ref 8.9–10.3)
Creatinine, Ser: 0.78 mg/dL (ref 0.44–1.00)
GFR calc Af Amer: >60 mL/min (ref >60)
GLUCOSE: 175 mg/dL — AB (ref 65–99)
POTASSIUM: 4.4 mmol/L (ref 3.5–5.1)
Sodium: 141 mmol/L (ref 135–145)

## 2016-01-15 LAB — I-STAT TROPONIN, ED: TROPONIN I, POC: 0 ng/mL (ref 0.00–0.08)

## 2016-01-15 LAB — CBC
HEMATOCRIT: 46.1 % — AB (ref 36.0–46.0)
HEMOGLOBIN: 14 g/dL (ref 12.0–15.0)
MCH: 30.4 pg (ref 26.0–34.0)
MCHC: 30.4 g/dL (ref 30.0–36.0)
MCV: 100.2 fL — AB (ref 78.0–100.0)
Platelets: 188 10*3/uL (ref 150–400)
RBC: 4.6 MIL/uL (ref 3.87–5.11)
RDW: 17.8 % — AB (ref 11.5–15.5)
WBC: 8 10*3/uL (ref 4.0–10.5)

## 2016-01-15 LAB — BRAIN NATRIURETIC PEPTIDE: B Natriuretic Peptide: 149.7 pg/mL — ABNORMAL HIGH (ref 0.0–100.0)

## 2016-01-15 LAB — CBG MONITORING, ED: Glucose-Capillary: 174 mg/dL — ABNORMAL HIGH (ref 65–99)

## 2016-01-15 MED ORDER — DEXTROSE 5 % IV SOLN
1.0000 g | Freq: Once | INTRAVENOUS | Status: AC
  Administered 2016-01-15: 1 g via INTRAVENOUS
  Filled 2016-01-15: qty 10

## 2016-01-15 MED ORDER — DEXTROSE 5 % IV SOLN
500.0000 mg | Freq: Once | INTRAVENOUS | Status: AC
  Administered 2016-01-16: 500 mg via INTRAVENOUS
  Filled 2016-01-15: qty 500

## 2016-01-15 NOTE — ED Notes (Addendum)
Pt here via [facility] for joint pain, tremors, cough and weakness.  Pt has a sore throat 2 weeks ago and was told by her pcp to take ibuprofen.  Pt normally has difficulty ambulating.  On 4L [place] per norm. Pt has also been told she has pressure ulcers on her legs and she would like [place] to look at them.

## 2016-01-15 NOTE — ED Provider Notes (Signed)
CSN: [id number]     Arrival date & time 01/15/16  1740 History   First MD Initiated Contact with Patient 01/15/16 2058     Chief Complaint  Patient presents with  . Joint Pain  . Weakness     (Consider location/radiation/quality/duration/timing/severity/associated sxs/prior Treatment) HPI   73 y f w PMH fibromyalgia, obesity, hl, arthritis, migraines, home oxygen requirement of 4L.  Who comes in with multiple complaints.  The first complaint is sore throat.  The throat pain started a couple of weeks ago and has resolved but since then she has been having some tremors for the past couple of weeks as well as well as some weakness, increased tiredness and a non-productive cough.  No fevers but some chills, no chest pain.  She has been using increased O2 at home, using her 4L [place] 24 hours per day instead of just nightly like she usually does 2/2 her sob.  Past Medical History  Diagnosis Date  . Normal echocardiogram 05/30/05    suboptimal study  . Obesity, morbid (more than 100 lbs over ideal weight or BMI > 40) (HCC)     obese since childhood  . Fibromyalgia   . Hyperlipidemia   . Sepsis ([place])   . Depression   . Neuropathy ([place])   . Peripheral edema   . Memory disturbance   . Sleep apnea     "recently dx'd; haven't got my equipment yet" (11/08/2014)  . Headache     "at least weekly; it's usually when I first wake up" (11/08/2014)  . Migraines 1970's - <2000    "they just went away"  . Osteoarthritis   . Arthritis     "I feel like it's everywhere" (11/08/2014)  . Chronic lower back pain   . Pressure ulcer of foot     left   Past Surgical History  Procedure Laterality Date  . Total abdominal hysterectomy  1994  . Abdominal hernia repair  2002  . Appendectomy  1995  . Cholecystectomy  1995   Family History  Problem Relation Age of Onset  . Stroke Mother   . Dementia Father   . Congestive Heart Failure Brother   . Diabetes Brother   . Hypertension Brother   . Prostate  cancer Father    Social History  Substance Use Topics  . Smoking status: Former Smoker -- 1.00 packs/day for 30 years    Types: Cigarettes    Quit date: 01/05/2009  . Smokeless tobacco: Never Used  . Alcohol Use: No   OB History    Gravida Para Term Preterm AB TAB SAB Ectopic Multiple Living   0 0 0 0 0 0 0 0 0 0      Review of Systems  Constitutional: Negative for fever and chills.  HENT: Negative for nosebleeds.   Eyes: Negative for visual disturbance.  Respiratory: Positive for cough. Negative for shortness of breath.   Cardiovascular: Negative for chest pain.  Gastrointestinal: Negative for nausea, vomiting, abdominal pain, diarrhea and constipation.  Genitourinary: Negative for dysuria.  Skin: Negative for rash.  Neurological: Positive for weakness.  All other systems reviewed and are negative.     Allergies  Aspirin  Home Medications   Prior to Admission medications   Medication Sig Start Date End Date Taking? Authorizing Provider  albuterol (PROVENTIL HFA;VENTOLIN HFA) 108 (90 BASE) MCG/ACT inhaler Inhale 2 puffs into the lungs every 6 (six) hours as needed for wheezing or shortness of breath. 01/20/15  Yes [name], MD  ARIPiprazole (ABILIFY) 2 MG tablet Take 1 tablet (2 mg total) by mouth every other day. 09/06/15  Yes [name], MD  Ascorbic Acid (VITAMIN C) 1000 MG tablet Take 3,000 mg by mouth at bedtime.    Yes Historical Provider, MD  cetirizine (ZYRTEC) 10 MG tablet Take 10 mg by mouth daily.   Yes Historical Provider, MD  nystatin (MYCOSTATIN) powder Apply topically 2 (two) times daily. Apply to affected skin BID Patient taking differently: Apply 1 g topically 2 (two) times daily. Apply to affected skin BID 10/26/15  Yes [name], MD  Omega-3 Fatty Acids (FISH OIL) 1000 MG CAPS Take 2,000 mg by mouth at bedtime.   Yes Historical Provider, MD  oxyCODONE-acetaminophen (PERCOCET) 10-325 MG tablet Take 1 tablet by mouth every 8 (eight) hours  as needed for pain. 12/26/15  Yes [name], MD  polyethylene glycol (MIRALAX / GLYCOLAX) packet Take 17 g by mouth at bedtime.    Yes Historical Provider, MD  potassium chloride SA (KLOR-CON M20) 20 MEQ tablet Take 2 tablets (40 mEq total) by mouth daily. 12/14/15  Yes [name], MD  pravastatin (PRAVACHOL) 40 MG tablet Take 1 tablet (40 mg total) by mouth at bedtime. 10/05/15  Yes [name], MD  pregabalin (LYRICA) 150 MG capsule Take 1 capsule (150 mg total) by mouth 2 (two) times daily. 09/14/15  Yes [name], MD  PSYLLIUM PO Take 2 capsules by mouth at bedtime.   Yes Historical Provider, MD  torsemide (DEMADEX) 20 MG tablet Take 2 tablets (40 mg total) by mouth 2 (two) times daily. Patient taking differently: Take 80 mg by mouth daily.  09/14/15  Yes [name], MD  traZODone (DESYREL) 150 MG tablet Take 1 tablet (150 mg total) by mouth 2 (two) times daily. Patient taking differently: Take 300 mg by mouth at bedtime.  10/05/15  Yes [name], MD  triamterene-hydrochlorothiazide (MAXZIDE-25) 37.5-25 MG tablet Take 1 tablet by mouth daily. 12/14/15  Yes [name], MD  Venlafaxine HCl 225 MG TB24 Take 225 mg by mouth daily.   Yes Historical Provider, MD  benzonatate (TESSALON) 100 MG capsule Take 1 capsule (100 mg total) by mouth 3 (three) times daily as needed for cough. 03/21/15   [name], MD  hydrocortisone valerate ointment (WESTCORT) 0.2 % Apply 1 application topically 2 (two) times daily. 08/22/15   [name], MD  metolazone (ZAROXOLYN) 2.5 MG tablet Take 1 tablet (2.5 mg total) by mouth once a week. Patient not taking: Reported on 01/15/2016 01/23/15   [name], MD   BP 147/89 mmHg  Pulse 92  Temp(Src) 97.3 F (36.3 C) (Oral)  Resp 18  Ht 5' 2.4" (1.585 m)  Wt 175.088 kg  BMI 69.69 kg/m2  SpO2 91% Physical Exam  Constitutional: She is oriented to person, place, and time. No distress.  HENT:  Head: Normocephalic and  atraumatic.  Eyes: EOM are normal. Pupils are equal, round, and reactive to light.  Neck: Normal range of motion. Neck supple.  Cardiovascular: Normal rate and intact distal pulses.   Pulmonary/Chest: No respiratory distress.  Quiet breath sounds.  Abdominal: Soft. She exhibits distension (obese). There is no tenderness. There is no rebound and no guarding.  Musculoskeletal: Normal range of motion.  Neurological: She is alert and oriented to person, place, and time.  Skin: No rash noted. She is not diaphoretic.  Psychiatric: She has a normal mood and affect.  ED Course  Procedures (including critical care time) Labs Review Labs Reviewed  BASIC METABOLIC PANEL - Abnormal; Notable for the following:    Chloride 88 (*)    CO2 39 (*)    Glucose, Bld 175 (*)    All other components within normal limits  CBC - Abnormal; Notable for the following:    HCT 46.1 (*)    MCV 100.2 (*)    RDW 17.8 (*)    All other components within normal limits  BRAIN NATRIURETIC PEPTIDE - Abnormal; Notable for the following:    B Natriuretic Peptide 149.7 (*)    All other components within normal limits  LIPID PANEL - Abnormal; Notable for the following:    Triglycerides 159 (*)    All other components within normal limits  CBG MONITORING, ED - Abnormal; Notable for the following:    Glucose-Capillary 174 (*)    All other components within normal limits  I-STAT VENOUS BLOOD GAS, ED - Abnormal; Notable for the following:    pH, Ven 7.427 (*)    pCO2, Ven 79.6 (*)    pO2, Ven 23.0 (*)    Bicarbonate 52.5 (*)    Acid-Base Excess 23.0 (*)    All other components within normal limits  CULTURE, BLOOD (ROUTINE X 2)  CULTURE, BLOOD (ROUTINE X 2)  TROPONIN I  HEMOGLOBIN A1C  I-STAT TROPOININ, ED    Imaging Review Dg Chest 2 View  01/15/2016  CLINICAL DATA:  Joint pain, tremors, cough, weakness all today, sore throat x 2 wks, pt on 4L O2 at home normally. Ex-smoker. Hx morbid obesity- not able to  ambulate well. EXAM: CHEST  2 VIEW COMPARISON:  11/08/2014 FINDINGS: Both views are moderate to markedly degraded secondary to patient body habitus and positioning. Obliquity on both views. Mild cardiomegaly. No pleural effusion or pneumothorax. Left lung is grossly clear. Right perihilar apparent opacity could be secondary to obliquity. The Chin overlies the apices. IMPRESSION: Moderate to severely degraded exam, as detailed above. Cardiomegaly. Cannot exclude right perihilar airspace disease/ pneumonia. This appearance could alternatively be secondary to obliquity. Consider repeat radiograph with better positioning. Electronically Signed   By: Abigail Miyamoto M.D.   On: 01/15/2016 18:56   I have personally reviewed and evaluated these images and lab results as part of my medical decision-making.   EKG Interpretation None      MDM   Final diagnoses:  Acute dyspnea   23 y f w PMH fibromyalgia, obesity, hl, arthritis, migraines who comes in with multiple complaints including weakness, mild sob, dry cough, subjective chills.  Exam as above unremarkable. Cbc/bmp w/o leukocytosis.  ekg unremarkable.  Trop neg.  cxr w/ possible R sided infiltrate. Given the cough/sob/ subjectively increased O2 requirement using her home O2 24 hours per day instead of nightly, concern for CAP.  Will cover with rocephin/azithro.  i have considered pe but feel it is less likely with what appears to be focal infiltrate on cxr.  Will admit to family med for obs     Jarome Matin, MD 01/16/16 1258  Jarome Matin, MD 01/16/16 [place] Yao, MD 01/18/16 [phone]

## 2016-01-15 NOTE — H&P (Signed)
[facility] Admission History and Physical Service Pager: [phone]  [name] Medical record number: [id number] Date of birth: Dec 02, 1961 Age: 55 y.o. Gender: female  Primary Care Provider: Andrena Mews, MD Consultants: None Code Status: Full  Chief Complaint: Shortness of breath  Assessment and Plan: Kathleen Roy is a 55 y.o. female presenting with shortness of breath and increased O2 requirement. PMH is significant for COPD, HTN, chronic lower extremity edema, HLD, depression, chronic back pain, OSA.  Shortness of breath: Some concern for CAP, given her "hacky" dry cough and CXR showing possible right perihilar airspace disease/pneumonia, however she is afebrile with a WBC count of 8.0. Requires 4L [place] at home, usually just at night, but has been requiring 4L O2 during the day recently (home O2 orders are for 4L continuous, but previous office not stating 2L). COPD, OSA(not compliant with CPAP), and obesity hypoventilation syndrome are all likely contributing to her shortness of breath. Lung exam is difficult due to body habitus, but no wheezing or crackles noted. TTE in 08/2014 revealed EF [id number], grade 1 diastolic dysfunction, and a PA pressure of 22. BNP on admission 14.9.7. No signs of HF currently. Venous blood gas shows mixed respiratory acidosis with metabolic alkalosis. She is a chronic CO2 retention (CO2 79.6). She has most likely been hypoxic for sometime given this compensation and her elevated hematocrit.  - Admit to observation, attending Dr. Ree Kida - s/p Rocephin and Azithromycin in the ED. Will discuss continuing these antibiotics in the morning. - Continue home Albuterol 2 puffs q6hrs prn - Continue home Tessalon - Continuous pulse ox for now, will switch to pulse ox with vitals in the AM - PT/OT eval -CPAP ordered for bedtime use; monitor for worsening effects or hypercapnia including headaches, lethargy, drowsiness, confusion,  tremor. May benefit from temporary BiPaP -Continue O2 therapy at 4L O2; wean as tolerated -consider repeating echo -monitor vitals  Dull chest pain: Likely musculoskeletal secondary to cough. Pain is reproducible with palpation of the sternum. i-stat trop 0.00 and trop I neg. Atypical in nature and does not appear to be cardiac.  - Pain medications as below - AM EKG - Continue to monitor  Hyperglycemia: Glucose 175 in the ED. Last HgbA1c was 6.3 in 2014. - Will get HgbA1c with AM labs  Hypertension: BP 115/73 in the ED. - Continue home Triamterene-HCTZ 37.5-25mg  bid  Chronic lower extremity edema: Takes Torsemide 40mg  bid and Metolazone 2.5mg  weekly. Venous statsis dermatitis present - Continue home Torsemide 40mg  bid and K-Dur 1mEq daily - AM BMP - Daily weights - monitor I/Os - consider UNNA boot. - maintain skin care - Aquaphor ordered for TID application  Hyperlipidemia: Last lipid panel (08/2013): Chol 141, TG 287, HDL 44, LDL 40. - Continue home Pravastatin 40mg  qhs -new lipid panel ordered  Depression: stable - Continue home Abilify 2mg  every other day, Venlafaxine 225mg  daily  Chronic back pain:  - Continue home Lyrica 150mg  bid and Percocet 10-325 q8hrs prn  OSA: Used to wear BiPAP but can't afford it anymore - Will order CPAP while hospitalized   Stage 1 pressure ulcers: Blanching erythema that is tender to palpation on bilateral posterior thighs. Patient with morbid obeisity and bedbound. Minimal ambulation. Pressure ulcers appreciated on upper thighs. H/o stage 4 pressure ulcer on left heel (now well healed) - Continue to monitor - frequent position changes in hiospital - wound care as appropriate  FEN/GI: Heart healthy diet, Miralax daily Prophylaxis: Heparin subq  Disposition: Admit to observation, attending Dr. Ree Kida.  History of Present Illness:  Kathleen Roy is a 55 y.o. female presenting with multiple complaints, including shortness of breath and  chest pain. Two weeks ago, she had a sore throat. She went [email or web address] and a doctor called her on the phone. She told him she had a sore throat. He prescribed her Ibuprofen 800mg  and told her to use Chloraseptic spray. She then started feeling worse. She started having tremors, sleepiness, dull chest pain, hacky cough, lack of energy, bumps on the back of her thighs. She thinks all of this is related to the Ibuprofen. She has never taken Ibuprofen in her life. She was told the bumps are pressure ulcers. They hurt really bad, even after just sitting for 30 minutes. She came to the ED because she "just doesn't feel like herself". She has had shortness of breath starting ~2 weeks ago. She is supposed to use 4L O2 24 hours a day but has been weaning herself down to just 4L at night. She is short of breath with just walking to the bathroom.   No fevers, no rhinorrhea, no congestion.  Lives with her brother Kathleen Roy), who is her primary caretaker.   In the ED, CXR showed possible right perihilar airspace disease/pneumonia. She was treated with Rocephin and Azithromycin. Her O2 saturations were stable on 4L O2 by [place].  Review Of Systems: Per HPI with the following additions: None Otherwise the remainder of the systems were negative.  Patient Active Problem List   Diagnosis Date Noted  . CAP (community acquired pneumonia) 01/15/2016  . Hypertriglyceridemia 08/22/2015  . Flashing lights seen 08/22/2015  . Rash and nonspecific skin eruption 08/22/2015  . 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 Bilateral knee pain 03/22/2015  . Cough 03/22/2015  . Skin irritation 01/20/2015  . OSA (obstructive sleep apnea)   . Obesity hypoventilation syndrome ([place])   . Chronic respiratory failure with hypoxia ([place])   . Hyperglycemia 11/08/2014  . Assistance needed for grooming 11/08/2014  . Hypoxia 11/08/2014  . Fatigue 11/03/2014  . Hyperammonemia ([place]) 08/09/2014  . Memory loss 03/18/2014  . Tremor  03/18/2014  . Vulvovaginal candidiasis 11/25/2013  . Restrictive airway disease 08/26/2013  . Hypokalemia 08/19/2013  . Health care maintenance 06/17/2013  . Hypoxemia 04/13/2013  . Venous stasis dermatitis 12/02/2012  . Shoulder pain 05/13/2012  . COPD (chronic obstructive pulmonary disease) ([place]) 10/15/2011  . Peripheral neuropathy ([place]) 10/15/2011  . History of cellulitis and abscess 2020-06-2611  . KNEE PAIN, BILATERAL 01/22/2011  . BACK PAIN, LUMBAR, CHRONIC 01/22/2011  . MIGRAINE HEADACHE 10/25/2010  . INTERTRIGO, CANDIDAL 10/31/2009  . CONSTIPATION, CHRONIC 08/22/2009  . HYPERLIPIDEMIA 04/07/2009  . EDEMA, CHRONIC 06/27/2008  . Morbid obesity ([place]) 11/16/2007  . DEPRESSION, MAJOR, RECURRENT 02/12/2007  . PANIC ATTACKS 02/12/2007  . HYPERTENSION, BENIGN SYSTEMIC 02/12/2007    Past Medical History: Past Medical History  Diagnosis Date  . Normal echocardiogram 05/30/05    suboptimal study  . Obesity, morbid (more than 100 lbs over ideal weight or BMI > 40) (HCC)     obese since childhood  . Fibromyalgia   . Hyperlipidemia   . Sepsis ([place])   . Depression   . Neuropathy ([place])   . Peripheral edema   . Memory disturbance   . Sleep apnea     "recently dx'd; haven't got my equipment yet" (11/08/2014)  . Headache     "at least weekly; it's usually when I  first wake up" (11/08/2014)  . Migraines 1970's - <2000    "they just went away"  . Osteoarthritis   . Arthritis     "I feel like it's everywhere" (11/08/2014)  . Chronic lower back pain   . Pressure ulcer of foot     left    Past Surgical History: Past Surgical History  Procedure Laterality Date  . Total abdominal hysterectomy  1994  . Abdominal hernia repair  2002  . Appendectomy  1995  . Cholecystectomy  1995    Social History: Social History  Substance Use Topics  . Smoking status: Former Smoker -- 1.00 packs/day for 30 years    Types: Cigarettes    Quit date: 01/05/2009  . Smokeless tobacco: Never  Used  . Alcohol Use: No   Additional social history: She smoked for 27 years. She quit 6 years ago.  Please also refer to relevant sections of EMR.  Family History: Family History  Problem Relation Age of Onset  . Stroke Mother   . Dementia Father   . Congestive Heart Failure Brother   . Diabetes Brother   . Hypertension Brother   . Prostate cancer Father     Allergies and Medications: Allergies  Allergen Reactions  . Aspirin Hives    REACTION: hives Hasn't had in years    No current facility-administered medications on file prior to encounter.   Current Outpatient Prescriptions on File Prior to Encounter  Medication Sig Dispense Refill  . albuterol (PROVENTIL HFA;VENTOLIN HFA) 108 (90 BASE) MCG/ACT inhaler Inhale 2 puffs into the lungs every 6 (six) hours as needed for wheezing or shortness of breath. 18 g 3  . ARIPiprazole (ABILIFY) 2 MG tablet Take 1 tablet (2 mg total) by mouth every other day. 45 tablet 1  . Ascorbic Acid (VITAMIN C) 1000 MG tablet Take 3,000 mg by mouth at bedtime.     Marland Kitchen nystatin (MYCOSTATIN) powder Apply topically 2 (two) times daily. Apply to affected skin BID (Patient taking differently: Apply 1 g topically 2 (two) times daily. Apply to affected skin BID) 360 g 6  . oxyCODONE-acetaminophen (PERCOCET) 10-325 MG tablet Take 1 tablet by mouth every 8 (eight) hours as needed for pain. 90 tablet 0  . polyethylene glycol (MIRALAX / GLYCOLAX) packet Take 17 g by mouth at bedtime.     . potassium chloride SA (KLOR-CON M20) 20 MEQ tablet Take 2 tablets (40 mEq total) by mouth daily. 180 tablet 0  . pravastatin (PRAVACHOL) 40 MG tablet Take 1 tablet (40 mg total) by mouth at bedtime. 90 tablet 1  . pregabalin (LYRICA) 150 MG capsule Take 1 capsule (150 mg total) by mouth 2 (two) times daily. 180 capsule 1  . PSYLLIUM PO Take 2 capsules by mouth at bedtime.    . torsemide (DEMADEX) 20 MG tablet Take 2 tablets (40 mg total) by mouth 2 (two) times daily. (Patient  taking differently: Take 80 mg by mouth daily. ) 360 tablet 1  . traZODone (DESYREL) 150 MG tablet Take 1 tablet (150 mg total) by mouth 2 (two) times daily. (Patient taking differently: Take 300 mg by mouth at bedtime. ) 180 tablet 1  . triamterene-hydrochlorothiazide (MAXZIDE-25) 37.5-25 MG tablet Take 1 tablet by mouth daily. 90 tablet 1  . benzonatate (TESSALON) 100 MG capsule Take 1 capsule (100 mg total) by mouth 3 (three) times daily as needed for cough. 20 capsule 0  . hydrocortisone valerate ointment (WESTCORT) 0.2 % Apply 1 application topically 2 (two)  times daily. 45 g 0  . metolazone (ZAROXOLYN) 2.5 MG tablet Take 1 tablet (2.5 mg total) by mouth once a week. (Patient not taking: Reported on 01/15/2016) 12 tablet 1  . Venlafaxine HCl 150 MG TB24 Take 1.5 tablets (225 mg total) by mouth daily. 30 each 0    Objective: BP 115/73 mmHg  Pulse 81  Temp(Src) 98.5 F (36.9 C) (Oral)  Resp 22  Ht 5\' 2"  (1.575 m)  Wt 375 lb (170.099 kg)  BMI 68.57 kg/m2  SpO2 98% Exam: General: Obese female, in NAD, talkative Eyes: PERRLA, EOMI, no scleral icterus ENTM: Oropharynx normal, mildly dry mucous membranes, [place] in place Neck: Supple, full ROM Cardiovascular: Distant heart sounds but seemingly RRR, no obvious murmurs, sternum is tender to palpation Respiratory: Distant breath sounds, no wheezes or crackles, normal work of breathing Abdomen: Obese, non-tender, non-distended MSK: Lower extremities with feet puffy, however only trace pitting edema noted. Skin: Very dry skin noted on legs and feet, blanching erythema present on posterior thighs bilaterally that is tender to palpation. Hyperpigmentation over the shins bilaterally with dry scaly skin, hyperkeratotic changes. Neuro: Awake, alert, CN 2-12 grossly intact, no focal deficits, able to wiggle toes Psych: Appropriate affect, normal behavior, depressed mood  Labs and Imaging: Results for orders placed or performed during the hospital  encounter of 01/15/16 (from the past 24 hour(s))  CBG monitoring, ED     Status: Abnormal   Collection Time: 01/15/16  6:13 PM  Result Value Ref Range   Glucose-Capillary 174 (H) 65 - 99 mg/dL  Basic metabolic panel     Status: Abnormal   Collection Time: 01/15/16  6:26 PM  Result Value Ref Range   Sodium 141 135 - 145 mmol/L   Potassium 4.4 3.5 - 5.1 mmol/L   Chloride 88 (L) 101 - 111 mmol/L   CO2 39 (H) 22 - 32 mmol/L   Glucose, Bld 175 (H) 65 - 99 mg/dL   BUN 17 6 - 20 mg/dL   Creatinine, Ser 0.78 0.44 - 1.00 mg/dL   Calcium 9.8 8.9 - 10.3 mg/dL   GFR calc non Af Amer >60 >60 mL/min   GFR calc Af Amer >60 >60 mL/min   Anion gap 14 5 - 15  CBC     Status: Abnormal   Collection Time: 01/15/16  6:26 PM  Result Value Ref Range   WBC 8.0 4.0 - 10.5 K/uL   RBC 4.60 3.87 - 5.11 MIL/uL   Hemoglobin 14.0 12.0 - 15.0 g/dL   HCT 46.1 (H) 36.0 - 46.0 %   MCV 100.2 (H) 78.0 - 100.0 fL   MCH 30.4 26.0 - 34.0 pg   MCHC 30.4 30.0 - 36.0 g/dL   RDW 17.8 (H) 11.5 - 15.5 %   Platelets 188 150 - 400 K/uL  Brain natriuretic peptide     Status: Abnormal   Collection Time: 01/15/16  9:38 PM  Result Value Ref Range   B Natriuretic Peptide 149.7 (H) 0.0 - 100.0 pg/mL  I-Stat Troponin, ED (not at [facility])     Status: None   Collection Time: 01/15/16  9:41 PM  Result Value Ref Range   Troponin i, poc 0.00 0.00 - 0.08 ng/mL   Comment 3          I-Stat Venous Blood Gas, ED (order at [facility] and [facility] only)     Status: Abnormal   Collection Time: 01/15/16  9:42 PM  Result Value Ref Range   pH, Ven 7.427 (  H) 7.250 - 7.300   pCO2, Ven 79.6 (HH) 45.0 - 50.0 mmHg   pO2, Ven 23.0 (LL) 30.0 - 45.0 mmHg   Bicarbonate 52.5 (H) 20.0 - 24.0 mEq/L   TCO2 >50 0 - 100 mmol/L   O2 Saturation 36.0 %   Acid-Base Excess 23.0 (H) 0.0 - 2.0 mmol/L   Patient temperature HIDE    Sample type VENOUS    Comment NOTIFIED PHYSICIAN   Troponin I     Status: None   Collection Time: 01/16/16  2:55 AM  Result Value Ref Range    Troponin I <0.03 <0.031 ng/mL   Dg Chest 2 View  01/15/2016  CLINICAL DATA:  Joint pain, tremors, cough, weakness all today, sore throat x 2 wks, pt on 4L O2 at home normally. Ex-smoker. Hx morbid obesity- not able to ambulate well. EXAM: CHEST  2 VIEW COMPARISON:  11/08/2014 FINDINGS: Both views are moderate to markedly degraded secondary to patient body habitus and positioning. Obliquity on both views. Mild cardiomegaly. No pleural effusion or pneumothorax. Left lung is grossly clear. Right perihilar apparent opacity could be secondary to obliquity. The Chin overlies the apices. IMPRESSION: Moderate to severely degraded exam, as detailed above. Cardiomegaly. Cannot exclude right perihilar airspace disease/ pneumonia. This appearance could alternatively be secondary to obliquity. Consider repeat radiograph with better positioning. Electronically Signed   By: Abigail Miyamoto M.D.   On: 01/15/2016 18:56   Sela Hua, MD 01/15/2016, 11:02 PM PGY-1, [place] Intern pager: [phone], text pages welcome  FPTS Upper-Level Resident Addendum  I have independently interviewed and examined the patient. I have discussed the above with the original author and agree with their documentation. My edits for correction/addition/clarification are in pink. Please see also any attending notes.   Katheren Shams, DO PGY-2, [place] Service pager: [phone] (text pages welcome through [facility])

## 2016-01-15 NOTE — ED Notes (Signed)
IV team nurse at bedside. 

## 2016-01-16 ENCOUNTER — Encounter (HOSPITAL_COMMUNITY): Payer: Self-pay | Admitting: General Practice

## 2016-01-16 ENCOUNTER — Ambulatory Visit: Payer: Medicare Other | Admitting: Family Medicine

## 2016-01-16 ENCOUNTER — Inpatient Hospital Stay (HOSPITAL_COMMUNITY): Payer: Medicare Other

## 2016-01-16 DIAGNOSIS — I872 Venous insufficiency (chronic) (peripheral): Secondary | ICD-10-CM | POA: Diagnosis present

## 2016-01-16 DIAGNOSIS — F339 Major depressive disorder, recurrent, unspecified: Secondary | ICD-10-CM | POA: Diagnosis present

## 2016-01-16 DIAGNOSIS — R918 Other nonspecific abnormal finding of lung field: Secondary | ICD-10-CM | POA: Diagnosis not present

## 2016-01-16 DIAGNOSIS — M1990 Unspecified osteoarthritis, unspecified site: Secondary | ICD-10-CM | POA: Diagnosis present

## 2016-01-16 DIAGNOSIS — R0602 Shortness of breath: Secondary | ICD-10-CM | POA: Diagnosis not present

## 2016-01-16 DIAGNOSIS — R531 Weakness: Secondary | ICD-10-CM | POA: Diagnosis not present

## 2016-01-16 DIAGNOSIS — I10 Essential (primary) hypertension: Secondary | ICD-10-CM | POA: Insufficient documentation

## 2016-01-16 DIAGNOSIS — E874 Mixed disorder of acid-base balance: Secondary | ICD-10-CM | POA: Diagnosis present

## 2016-01-16 DIAGNOSIS — E1142 Type 2 diabetes mellitus with diabetic polyneuropathy: Secondary | ICD-10-CM | POA: Diagnosis present

## 2016-01-16 DIAGNOSIS — L8990 Pressure ulcer of unspecified site, unspecified stage: Secondary | ICD-10-CM | POA: Insufficient documentation

## 2016-01-16 DIAGNOSIS — L89891 Pressure ulcer of other site, stage 1: Secondary | ICD-10-CM | POA: Diagnosis present

## 2016-01-16 DIAGNOSIS — R0600 Dyspnea, unspecified: Secondary | ICD-10-CM | POA: Diagnosis present

## 2016-01-16 DIAGNOSIS — G8929 Other chronic pain: Secondary | ICD-10-CM | POA: Diagnosis present

## 2016-01-16 DIAGNOSIS — E662 Morbid (severe) obesity with alveolar hypoventilation: Secondary | ICD-10-CM

## 2016-01-16 DIAGNOSIS — Z23 Encounter for immunization: Secondary | ICD-10-CM | POA: Diagnosis not present

## 2016-01-16 DIAGNOSIS — J9621 Acute and chronic respiratory failure with hypoxia: Secondary | ICD-10-CM | POA: Diagnosis present

## 2016-01-16 DIAGNOSIS — R0789 Other chest pain: Secondary | ICD-10-CM | POA: Diagnosis present

## 2016-01-16 DIAGNOSIS — I452 Bifascicular block: Secondary | ICD-10-CM | POA: Diagnosis present

## 2016-01-16 DIAGNOSIS — J189 Pneumonia, unspecified organism: Secondary | ICD-10-CM

## 2016-01-16 DIAGNOSIS — M797 Fibromyalgia: Secondary | ICD-10-CM | POA: Diagnosis present

## 2016-01-16 DIAGNOSIS — G4733 Obstructive sleep apnea (adult) (pediatric): Secondary | ICD-10-CM

## 2016-01-16 DIAGNOSIS — J440 Chronic obstructive pulmonary disease with acute lower respiratory infection: Secondary | ICD-10-CM | POA: Diagnosis present

## 2016-01-16 DIAGNOSIS — J9622 Acute and chronic respiratory failure with hypercapnia: Secondary | ICD-10-CM | POA: Diagnosis not present

## 2016-01-16 DIAGNOSIS — F32A Depression, unspecified: Secondary | ICD-10-CM | POA: Insufficient documentation

## 2016-01-16 DIAGNOSIS — I5032 Chronic diastolic (congestive) heart failure: Secondary | ICD-10-CM | POA: Diagnosis not present

## 2016-01-16 DIAGNOSIS — Z9981 Dependence on supplemental oxygen: Secondary | ICD-10-CM | POA: Diagnosis not present

## 2016-01-16 DIAGNOSIS — E1165 Type 2 diabetes mellitus with hyperglycemia: Secondary | ICD-10-CM | POA: Diagnosis present

## 2016-01-16 DIAGNOSIS — Z7401 Bed confinement status: Secondary | ICD-10-CM | POA: Diagnosis not present

## 2016-01-16 DIAGNOSIS — Z6841 Body Mass Index (BMI) 40.0 and over, adult: Secondary | ICD-10-CM | POA: Diagnosis not present

## 2016-01-16 DIAGNOSIS — M545 Low back pain: Secondary | ICD-10-CM | POA: Diagnosis present

## 2016-01-16 DIAGNOSIS — Z87891 Personal history of nicotine dependence: Secondary | ICD-10-CM | POA: Diagnosis not present

## 2016-01-16 DIAGNOSIS — I878 Other specified disorders of veins: Secondary | ICD-10-CM | POA: Diagnosis present

## 2016-01-16 DIAGNOSIS — F329 Major depressive disorder, single episode, unspecified: Secondary | ICD-10-CM | POA: Insufficient documentation

## 2016-01-16 DIAGNOSIS — Z9119 Patient's noncompliance with other medical treatment and regimen: Secondary | ICD-10-CM | POA: Diagnosis not present

## 2016-01-16 LAB — TROPONIN I

## 2016-01-16 LAB — LIPID PANEL
CHOLESTEROL: 119 mg/dL (ref 0–200)
HDL: 45 mg/dL (ref >40)
LDL CALC: 42 mg/dL (ref 0–99)
TRIGLYCERIDES: 159 mg/dL — AB (ref <150)
Total CHOL/HDL Ratio: 2.6 RATIO
VLDL: 32 mg/dL (ref 0–40)

## 2016-01-16 MED ORDER — TIOTROPIUM BROMIDE MONOHYDRATE 18 MCG IN CAPS
18.0000 ug | ORAL_CAPSULE | Freq: Every day | RESPIRATORY_TRACT | Status: DC
  Administered 2016-01-17: 18 ug via RESPIRATORY_TRACT
  Filled 2016-01-16 (×2): qty 5

## 2016-01-16 MED ORDER — ALBUTEROL SULFATE (2.5 MG/3ML) 0.083% IN NEBU: 3.0000 mL | INHALATION_SOLUTION | Freq: Four times a day (QID) | RESPIRATORY_TRACT | Status: DC | PRN

## 2016-01-16 MED ORDER — AQUAPHOR EX OINT
TOPICAL_OINTMENT | Freq: Three times a day (TID) | CUTANEOUS | Status: DC
  Administered 2016-01-16: 1 via TOPICAL
  Administered 2016-01-16: 04:00:00 via TOPICAL
  Administered 2016-01-16: 1 via TOPICAL
  Administered 2016-01-17 (×2): via TOPICAL
  Filled 2016-01-16: qty 50

## 2016-01-16 MED ORDER — CETYLPYRIDINIUM CHLORIDE 0.05 % MT LIQD
7.0000 mL | Freq: Two times a day (BID) | OROMUCOSAL | Status: DC
  Administered 2016-01-16 – 2016-01-17 (×3): 7 mL via OROMUCOSAL

## 2016-01-16 MED ORDER — BENZONATATE 100 MG PO CAPS: 100.0000 mg | ORAL_CAPSULE | Freq: Three times a day (TID) | ORAL | Status: DC | PRN

## 2016-01-16 MED ORDER — ONDANSETRON HCL 4 MG/2ML IJ SOLN: 4.0000 mg | Freq: Four times a day (QID) | INTRAMUSCULAR | Status: DC | PRN

## 2016-01-16 MED ORDER — TRIAMTERENE-HCTZ 37.5-25 MG PO TABS
1.0000 | ORAL_TABLET | Freq: Every day | ORAL | Status: DC
  Administered 2016-01-16 – 2016-01-17 (×2): 1 via ORAL
  Filled 2016-01-16 (×2): qty 1

## 2016-01-16 MED ORDER — NYSTATIN 100000 UNIT/GM EX POWD
Freq: Two times a day (BID) | CUTANEOUS | Status: DC
  Administered 2016-01-16 – 2016-01-17 (×3): via TOPICAL
  Filled 2016-01-16: qty 15

## 2016-01-16 MED ORDER — OXYCODONE HCL 5 MG PO TABS
5.0000 mg | ORAL_TABLET | Freq: Three times a day (TID) | ORAL | Status: DC | PRN
  Administered 2016-01-16 – 2016-01-17 (×4): 5 mg via ORAL
  Filled 2016-01-16 (×4): qty 1

## 2016-01-16 MED ORDER — OXYCODONE-ACETAMINOPHEN 10-325 MG PO TABS: 1.0000 | ORAL_TABLET | Freq: Three times a day (TID) | ORAL | Status: DC | PRN

## 2016-01-16 MED ORDER — TORSEMIDE 20 MG PO TABS
40.0000 mg | ORAL_TABLET | Freq: Two times a day (BID) | ORAL | Status: DC
  Administered 2016-01-16 – 2016-01-17 (×3): 40 mg via ORAL
  Filled 2016-01-16 (×3): qty 2

## 2016-01-16 MED ORDER — OXYCODONE-ACETAMINOPHEN 5-325 MG PO TABS
1.0000 | ORAL_TABLET | Freq: Three times a day (TID) | ORAL | Status: DC | PRN
  Administered 2016-01-16 – 2016-01-17 (×2): 1 via ORAL
  Filled 2016-01-16 (×2): qty 1

## 2016-01-16 MED ORDER — POTASSIUM CHLORIDE CRYS ER 20 MEQ PO TBCR
40.0000 meq | EXTENDED_RELEASE_TABLET | Freq: Every day | ORAL | Status: DC
  Administered 2016-01-16 – 2016-01-17 (×2): 40 meq via ORAL
  Filled 2016-01-16 (×2): qty 2

## 2016-01-16 MED ORDER — ARIPIPRAZOLE 2 MG PO TABS
2.0000 mg | ORAL_TABLET | ORAL | Status: DC
  Administered 2016-01-16: 2 mg via ORAL
  Filled 2016-01-16: qty 1

## 2016-01-16 MED ORDER — ONDANSETRON HCL 4 MG PO TABS: 4.0000 mg | ORAL_TABLET | Freq: Four times a day (QID) | ORAL | Status: DC | PRN

## 2016-01-16 MED ORDER — PNEUMOCOCCAL VAC POLYVALENT 25 MCG/0.5ML IJ INJ
0.5000 mL | INJECTION | INTRAMUSCULAR | Status: AC
  Administered 2016-01-17: 0.5 mL via INTRAMUSCULAR
  Filled 2016-01-16: qty 0.5

## 2016-01-16 MED ORDER — HEPARIN SODIUM (PORCINE) 5000 UNIT/ML IJ SOLN
5000.0000 [IU] | Freq: Three times a day (TID) | INTRAMUSCULAR | Status: DC
  Administered 2016-01-16 – 2016-01-17 (×5): 5000 [IU] via SUBCUTANEOUS
  Filled 2016-01-16 (×5): qty 1

## 2016-01-16 MED ORDER — DEXTROSE 5 % IV SOLN
1.0000 g | INTRAVENOUS | Status: DC
  Administered 2016-01-16: 1 g via INTRAVENOUS
  Filled 2016-01-16 (×3): qty 10

## 2016-01-16 MED ORDER — PREGABALIN 75 MG PO CAPS
150.0000 mg | ORAL_CAPSULE | Freq: Two times a day (BID) | ORAL | Status: DC
  Administered 2016-01-16 – 2016-01-17 (×4): 150 mg via ORAL
  Filled 2016-01-16 (×4): qty 2

## 2016-01-16 MED ORDER — POLYETHYLENE GLYCOL 3350 17 G PO PACK
17.0000 g | PACK | Freq: Every day | ORAL | Status: DC
  Administered 2016-01-16: 17 g via ORAL
  Filled 2016-01-16: qty 1

## 2016-01-16 MED ORDER — AZITHROMYCIN 500 MG PO TABS
250.0000 mg | ORAL_TABLET | Freq: Every day | ORAL | Status: DC
  Administered 2016-01-16 – 2016-01-17 (×2): 250 mg via ORAL
  Filled 2016-01-16 (×2): qty 1

## 2016-01-16 MED ORDER — VENLAFAXINE HCL ER 75 MG PO CP24
225.0000 mg | ORAL_CAPSULE | Freq: Every day | ORAL | Status: DC
  Administered 2016-01-16 – 2016-01-17 (×2): 225 mg via ORAL
  Filled 2016-01-16 (×2): qty 3

## 2016-01-16 MED ORDER — PRAVASTATIN SODIUM 40 MG PO TABS
40.0000 mg | ORAL_TABLET | Freq: Every day | ORAL | Status: DC
  Administered 2016-01-16 (×2): 40 mg via ORAL
  Filled 2016-01-16 (×2): qty 1

## 2016-01-16 MED ORDER — TRAZODONE HCL 100 MG PO TABS
300.0000 mg | ORAL_TABLET | Freq: Every day | ORAL | Status: DC
  Administered 2016-01-16: 300 mg via ORAL
  Filled 2016-01-16: qty 3

## 2016-01-16 NOTE — Progress Notes (Signed)
OT Cancellation Note  Patient Details Name: Kathleen Roy MRN: [id number] DOB: 1961/10/20   Cancelled Treatment:    Reason Eval/Treat Not Completed: Patient at procedure or test/ unavailable. Will follow.  Malka So 01/16/2016, 3:31 PM  [phone]

## 2016-01-16 NOTE — Progress Notes (Signed)
Family Medicine Teaching Service Daily Progress Note Intern Pager: [phone]  [name] Medical record number: [id number] Date of birth: Jun 01, 1961 Age: 55 y.o. Gender: female  Primary Care Provider: Andrena Mews, MD Consultants: None Code Status: Full  Pt Overview and Major Events to Date:  1/30: Admitted to FMTS with multiple complaints including shortness of breath  Assessment and Plan: ALEZANDRA ORTMANN is a 55 y.o. female presenting with shortness of breath and increased O2 requirement. PMH is significant for COPD, HTN, chronic lower extremity edema, HLD, depression, chronic back pain, OSA.  Shortness of breath: Likely CAP vs CHF vs multifactorial 2/2 COPD, OSA (not wearing CPAP), and obesity hypoventilation syndrome. Stable on 4L O2 overnight.  - s/p Rocephin and Azithromycin in the ED. Will continue these. - Continue home Albuterol 2 puffs q6hrs prn.  - Spiriva restarted this morning. Pt has been on this in the past. - Continue home Tessalon - Continuous pulse ox for now, will switch to pulse ox with vitals in the AM - Repeat CXR this morning. - Blood cultures pending - PT/OT eval - Pt agreeable to using BiPAP tonight to help with CO2 retention. - Will get an ECHO, as she would likely have a hard time getting. - Continue O2 therapy at 4L O2; wean as tolerated - Monitor vitals  Dull chest pain: Likely musculoskeletal secondary to cough. Pain is reproducible with palpation of the sternum. i-stat trop 0.00 and trop I neg. Atypical in nature and does not appear to be cardiac. AM EKG showing RBBB, LAFB, appears similar to previous EKG. - Pain medications as below - Continue to monitor  Hyperglycemia: Glucose 175 in the ED. Last HgbA1c was 6.3 in 2014. - HgbA1c pending  Hypertension: BP up to 147/89 this morning - Continue home Triamterene-HCTZ 37.5-25mg  bid  Chronic lower extremity edema: Takes Torsemide 40mg  bid and Metolazone 2.5mg  weekly. Venous statsis  dermatitis present. AM BMP is pending. - Continue home Torsemide 40mg  bid and K-Dur 81mEq daily - Daily weights - Monitor I/Os - Consider UNNA boot. - Maintain skin care - Aquaphor ordered for TID application  Hyperlipidemia: Last lipid panel (08/2013): Chol 141, TG 287, HDL 44, LDL 40. - Continue home Pravastatin 40mg  qhs - Lipid panel pending  Depression: stable - Continue home Abilify 2mg  every other day, Venlafaxine 225mg  daily  Chronic back pain:  - Continue home Lyrica 150mg  bid and Percocet 10-325 q8hrs prn  OSA: Used to wear BiPAP but can't afford it anymore - Will order CPAP while hospitalized   Stage 1 pressure ulcers: Blanching erythema that is tender to palpation on bilateral posterior thighs. Patient with morbid obeisity and bedbound. Minimal ambulation. Pressure ulcers appreciated on upper thighs. H/o stage 4 pressure ulcer on left heel (now well healed) - Continue to monitor - Frequent position changes in hospital - Wound care as appropriate  FEN/GI: Heart healthy diet, Miralax daily Prophylaxis: Heparin subq  Disposition: Anticipate home tomorrow.  Subjective:  Pt states she is doing fine this morning. She is having chest pain when she coughs. She did not use the CPAP overnight because she did not want to.  Objective: Temp:  [97.3 F (36.3 C)-98.5 F (36.9 C)] 97.3 F (36.3 C) (01/31 0542) Pulse Rate:  [81-98] 92 (01/31 0542) Resp:  [16-22] 18 (01/31 0542) BP: (98-147)/(55-95) 147/89 mmHg (01/31 0542) SpO2:  [91 %-99 %] 91 % (01/31 0542) Weight:  [375 lb (170.099 kg)-386 lb (175.088 kg)] 386 lb (175.088 kg) (01/31 [id number]) Physical Exam:  General: Obese female, in NAD, talkative HEENT: [place]/AT, EOMI, [place] in place, MMM Neck: Supple, full ROM Cardiovascular: Distant heart sounds but seemingly RRR, no obvious murmurs, sternum is tender to palpation Respiratory: Distant breath sounds, no wheezes or crackles, normal work of breathing Abdomen: Obese, non-tender,  non-distended MSK: Lower extremities with feet puffy, however only trace pitting edema noted. Skin: Very dry skin noted on legs and feet, blanching erythema present on posterior thighs bilaterally that is tender to palpation. Hyperpigmentation over the shins bilaterally with dry scaly skin, hyperkeratotic changes. Neuro: Awake, alert, CN 2-12 grossly intact, no focal deficits, able to wiggle toes Psych: Appropriate affect, normal behavior, depressed mood  Laboratory:  Recent Labs Lab 01/15/16 1826  WBC 8.0  HGB 14.0  HCT 46.1*  PLT 188    Recent Labs Lab 01/15/16 1826  NA 141  K 4.4  CL 88*  CO2 39*  BUN 17  CREATININE 0.78  CALCIUM 9.8  GLUCOSE 175*   VBG: pH 7.4, pCO2 79.6, pO2 23   Imaging/Diagnostic Tests: CXR: Cardiomegaly, cannot exclude right perihilar airspace disease/pneumonia.   Sela Hua, MD 01/16/2016, 7:56 AM PGY-1, [place] Intern pager: [phone], text pages welcome

## 2016-01-16 NOTE — Progress Notes (Signed)
Kathleen Roy is a 55 y.o. female patient admitted from ED awake, alert - oriented  X 4 - no acute distress noted.  VSS - Blood pressure 118/86, pulse 86, temperature 97.8 F (36.6 C), temperature source Oral, resp. rate 18, height 5' 2.4" (1.585 m), weight 175.088 kg (386 lb), SpO2 97 %.    IV in place, occlusive dsg intact without redness.  Orientation to room, and floor completed with information packet given to patient/family.  Patient declined safety video at this time.  Admission INP armband ID verified with patient/family, and in place.   SR up x 2, fall assessment complete, with patient and family able to verbalize understanding of risk associated with falls, and verbalized understanding to call nsg before up out of bed.  Call light within reach, patient able to voice, and demonstrate understanding.  Skin, clean-dry- intact without evidence of bruising, or skin tears.   No evidence of skin break down noted on exam.     Will cont to eval and treat per MD orders.  Teryl Lucy, RN 01/16/2016 4:07 AM

## 2016-01-16 NOTE — Care Management Note (Signed)
Case Management Note  Patient Details  Name: Kathleen Roy MRN: [id number] Date of Birth: [birth year]  Subjective/Objective:               Admitted with dyspnea from home with brother. HX: home oxygen,morbid obesity, HTN, bil DJD of knees, chronic lower extremity.    Action/Plan: Awaiting PT/OT evals ......Marland KitchenCM to f/u with disposition needs.  Expected Discharge Date:                  Expected Discharge Plan:  Home/Self Care  In-House Referral:     Discharge planning Services  CM Consult  Post Acute Care Choice:  Resumption of Svcs/PTA Provider, Durable Medical Equipment Choice offered to:     DME Arranged:  Oxygen DME Agency:  [place]:    [facility] Agency:     Status of Service:  In process, will continue to follow  Medicare Important Message Given:    Date Medicare IM Given:    Medicare IM give by:    Date Additional Medicare IM Given:    Additional Medicare Important Message give by:     If discussed at [place] of Stay Meetings, dates discussed:    Additional Comments: [name])  [phone]  [place], [place] [phone] 01/16/2016, 6:29 PM

## 2016-01-16 NOTE — Evaluation (Signed)
Physical Therapy Evaluation Patient Details Name: Kathleen Roy MRN: [id number] DOB: 09/02/1961 Today's Date: 01/16/2016   History of Present Illness  Pt adm with SOB likely due to PNA vs CHF. PMH - morbid obesity, HTN, bil DJD of knees, chronic lower extremity edema, COPD  Clinical Impression  Pt admitted with above diagnosis and presents to PT with functional limitations due to deficits listed below (See PT problem list). Pt needs skilled PT to maximize independence and safety to allow discharge to home with brother. Pt with limited mobility and sedentary lifestyle prior to admission. Expect pt will be able to return to this level.     Follow Up Recommendations No PT follow up;Supervision for mobility/OOB (initially may need help for OOB)    Equipment Recommendations  Other (comment) (To be determined)    Recommendations for Other Services       Precautions / Restrictions Precautions Precautions: Fall Restrictions Weight Bearing Restrictions: No      Mobility  Bed Mobility Overal bed mobility: Needs Assistance Bed Mobility: Supine to Sit     Supine to sit: Min assist;HOB elevated     General bed mobility comments: Assist to bring legs off EOB  Transfers Overall transfer level: Needs assistance Equipment used: Straight cane Transfers: Sit to/from [facility] Sit to Stand: Min assist Stand pivot transfers: Min assist       General transfer comment: Assist for support. Pt with pivotal steps from bed to bsc and then stood from bsc to recliner by standing with recliner put in place of bedside commode. Extended time to perform mobility.  Ambulation/Gait                Stairs            Wheelchair Mobility    Modified Rankin (Stroke Patients Only)       Balance Overall balance assessment: Needs assistance Sitting-balance support: No upper extremity supported;Feet supported Sitting balance-Leahy Scale: Good     Standing balance  support: During functional activity;Single extremity supported Standing balance-Leahy Scale: Poor Standing balance comment: Currently needs UE support for confidence                             Pertinent Vitals/Pain Pain Assessment: No/denies pain    Home Living Family/patient expects to be discharged to:: Private residence Living Arrangements: Other relatives (brother) Available Help at Discharge: Family;Available 24 hours/day Type of Home: House Home Access: Stairs to enter Entrance Stairs-Rails: Right Entrance Stairs-Number of Steps: 3 Home Layout: One level Home Equipment: Cane - single point;Bedside commode;Shower seat      Prior Function Level of Independence: Needs assistance   Gait / Transfers Assistance Needed: amb short distances with cane.           Hand Dominance   Dominant Hand: Right    Extremity/Trunk Assessment   Upper Extremity Assessment: Defer to OT evaluation           Lower Extremity Assessment: Generalized weakness         Communication   Communication: No difficulties  Cognition Arousal/Alertness: Awake/alert Behavior During Therapy: WFL for tasks assessed/performed Overall Cognitive Status: Within Functional Limits for tasks assessed                      General Comments      Exercises        Assessment/Plan    PT Assessment Patient needs continued PT  services  PT Diagnosis Difficulty walking;Generalized weakness   PT Problem List Decreased strength;Decreased activity tolerance;Decreased balance;Decreased mobility  PT Treatment Interventions DME instruction;Gait training;Functional mobility training;Therapeutic activities;Therapeutic exercise;Balance training;Patient/family education   PT Goals (Current goals can be found in the Care Plan section) Acute Rehab PT Goals Patient Stated Goal: Return home PT Goal Formulation: With patient Time For Goal Achievement: 01/23/16 Potential to Achieve Goals:  Good    Frequency Min 3X/week   Barriers to discharge        Co-evaluation               End of Session Equipment Utilized During Treatment: Oxygen Activity Tolerance: Patient limited by fatigue Patient left: in chair;with call bell/phone within reach Nurse Communication: Mobility status         Time: [id number] PT Time Calculation (min) (ACUTE ONLY): 51 min   Charges:   PT Evaluation $PT Eval Moderate Complexity: 1 Procedure PT Treatments $Therapeutic Activity: 23-37 mins   PT G Codes:        Jina Olenick Feb 13, 2016, 3:11 PM [employer] PT [phone]

## 2016-01-16 NOTE — Progress Notes (Signed)
Pt refusing cpap tonight, states she hasnt had one at home because she cant afford it, and doesn't want to wear one while she is here in the hospital. Encouraged her to call if she changed her mind.

## 2016-01-16 NOTE — Progress Notes (Addendum)
Lab at bedside attempting to obtain blood cultures. Phlebotomist stated that she had multiple attempts "but her veins kept collapsing" and that she is going to send another phlebotomist to draw lab. Patient refusing asking to have them come back in an hour and attempt then.   5:24 PM MD paged to make aware and to clarify if antibiotic should be held or given until blood cultures are drawn. Awaiting call back.   5:29 PM MD called back with telephone order to wait and give antibioticsafter blood cultures drawn and to give azithromycin PO tonight at the same time as rocephin at 8pm.

## 2016-01-16 NOTE — Consult Note (Signed)
WOC wound consult note Reason for Consult: Consult requested for sacrum and bilat legs.  Pt has a high BMI and has significant skin folds.  It is difficult to promote healing related to constant moisture and pressure in the affected areas.   Wound type: Sacrum area has a deep valley over the upper inner gluteal cleft.  It is red and blanchable stage 1; affected area approx 3X3cm, surrounded by moisture associated skin damage in red moist patchy areas.  Previously noted stage 1 pressure injuries to bilat posterior thighs have resolved at this time.  Inner abd skin folds red and moist with partial thickness fissures; appearance consistent with intertrigo.   Bilat legs and feet with generalized lymphadenia and erythremia.  Skin with peau 'de orange appearance.  No open wounds or drainage.  Pt states she tried compression wraps in the past but it did not help with the chronic lymphadenia and they were too expensive. Pressure Ulcer POA: Yes Dressing procedure/placement/frequency: Pt has already been placed on an air mattress to reduce pressure and nystatin has been ordered for skin folds.  Discussed plan of care with patient and she verbalized understanding. Please re-consult if further assistance is needed.  Thank-you,  Julien Girt MSN, [place], [place], [place], [place]

## 2016-01-16 NOTE — Discharge Summary (Signed)
[facility] Discharge Summary  [name] Medical record number: [id number] Date of birth: 1961/02/13 Age: 55 y.o. Gender: female Date of Admission: 01/15/2016  Date of Discharge: 01/17/2016 Admitting Physician: Lupita Dawn, MD  Primary Care Provider: Andrena Mews, MD Consultants:   Indication for Hospitalization: Shortness of breath, increased O2 requirement from baseline.  Discharge Diagnoses/Problem List:  COPD DD associated hypoventilation syndrome Hypertension Hyperlipidemia OSA Depression Chronic lower extremity edema Chronic back pain New diagnosis of T2DM  Disposition: Home  Discharge Condition: Stable  Discharge Exam:  General: Obese female, in NAD, talkative Cardiovascular: Distant heart sounds but seemingly RRR, no obvious murmurs, sternum is tender to palpation Respiratory: Distant breath sounds, no wheezes or crackles, normal work of breathing Abdomen: Obese, non-tender, non-distended Extremities: bilaterally venous stasis, swelling in lower extremities.   Brief Hospital Course:  Kathleen Roy is a 55 year old female who presented to the ED with multiple complaints including shortness of breath, chest pain, tremors, lack of energy, and "hacky" cough for the last two weeks. She is supposed to be using 4L O2 by [facility] at home, but has weaned herself off of the O2 and has only been using it at night. Since her shortness of breath started, she has been using 4L all throughout the day and night. In the ED, WBC 8.0, lactic acid 0.44, BNP 150. CXR showed possible right perihilar airspace disease/pneumonia. She was treated with Rocephin and Azithromycin for CAP coverage. Her O2 saturations were stable on 4L O2. Hospital course is described by problem list below.   Shortness of breath: We had some concern for CAP, given her "hacky" cough and CXR showing possible infiltrates, so we continued her Azithromycin and Rocephin. We thought her  COPD, OSA (doesn't wear her CPAP), obesity hypoventilation syndrome were all likely contributors to her shortness of breath. We restarted her Spiriva (she has been on this in the past) and encouraged her to use BiPAP overnight while in the hospital. The patient refused stating that she wasn't able to afford BiPAP at home and therefore was not going to wear it while at the hospital. Patient was directed to resource for possible reduced cost BiPAP machine. Patient completed course of azithromycin. We repeated an ECHO, which showed normal ejection fraction. We ordered a PT/OT consult, given that she seems to be mostly bed bound. PT and OT recommended equipment.  Chest pain like musculoskeletal: We thought her "dull" chest pain was musculoskeletal in etiology, in the setting of recent cough. Her chest pain was reproducible on palpation of the sternum. I-stat troponin and serum troponin were both negative. EKG showed RBBB and LAFB, which were unchanged from a previous EKG.   New Diagnosis of T2DM. Patient was found to have an A1c of 8 during this admission. Patient was provided with diabetes education. Weight loss was discussed. Patient was started on metformin with outpatient follow-up.  Issues for Follow Up:  1. Consider decreasing Torsemide dose. We think her lower extremity edema is likely secondary to venous insufficiency. She did not appear fluid overloaded on exam. 2. Type 2 diabetes diagnosis while in hospital started patient on 500 mg of metformin twice a day, patient should be instructed to increase this amount to 1000 twice a day as tolerated. 3. Patient started on Spiriva for COPD has been on his medication in the past.  4. Patient provided with 4 more days of azithromycin once daily for possible CAP infection 5. Provided patient with resource for  obtaining BiPAP machine at reduce costs from [employer] 6. Discussed weight loss with patient, advised patient she would qualify for  bariatric surgery if she able to loss weight.   Significant Procedures:  ECHO 01/17/2016 Left ventricle: The cavity size was normal. Systolic function was normal. The estimated ejection fraction was in the range of 55% to 60%. Although no diagnostic regional wall motion abnormality was identified, this possibility cannot be completely excluded on the basis of this study. The study is not technically sufficient to allow evaluation of LV diastolic function. - Right ventricle: The cavity size was mildly dilated. Wall thickness was normal.  Dg Chest 2 View  01/16/2016  CLINICAL DATA:  Acute dyspnea EXAM: CHEST  2 VIEW COMPARISON:  01/15/2016 FINDINGS: Cardiac shadow is stable. The left lung remains clear. Persistent right perihilar density is noted. No other focal abnormality is seen. IMPRESSION: Persistent right perihilar infiltrate. Continued followup is recommended. Electronically Signed   By: Inez Catalina M.D.   On: 01/16/2016 15:36   Dg Chest 2 View  01/15/2016  CLINICAL DATA:  Joint pain, tremors, cough, weakness all today, sore throat x 2 wks, pt on 4L O2 at home normally. Ex-smoker. Hx morbid obesity- not able to ambulate well. EXAM: CHEST  2 VIEW COMPARISON:  11/08/2014 FINDINGS: Both views are moderate to markedly degraded secondary to patient body habitus and positioning. Obliquity on both views. Mild cardiomegaly. No pleural effusion or pneumothorax. Left lung is grossly clear. Right perihilar apparent opacity could be secondary to obliquity. The Chin overlies the apices. IMPRESSION: Moderate to severely degraded exam, as detailed above. Cardiomegaly. Cannot exclude right perihilar airspace disease/ pneumonia. This appearance could alternatively be secondary to obliquity. Consider repeat radiograph with better positioning. Electronically Signed   By: Abigail Miyamoto M.D.   On: 01/15/2016 18:56    Significant Labs and Imaging:   Recent Labs Lab 01/15/16 1826  WBC 8.0  HGB 14.0   HCT 46.1*  PLT 188    Recent Labs Lab 01/15/16 1826  NA 141  K 4.4  CL 88*  CO2 39*  GLUCOSE 175*  BUN 17  CREATININE 0.78  CALCIUM 9.8    Results/Tests Pending at Time of Discharge: None  Discharge Medications:    Medication List    STOP taking these medications        metolazone 2.5 MG tablet  Commonly known as:  ZAROXOLYN     pregabalin 150 MG capsule  Commonly known as:  LYRICA      TAKE these medications        albuterol 108 (90 Base) MCG/ACT inhaler  Commonly known as:  PROVENTIL HFA;VENTOLIN HFA  Inhale 2 puffs into the lungs every 6 (six) hours as needed for wheezing or shortness of breath.     ARIPiprazole 2 MG tablet  Commonly known as:  ABILIFY  Take 1 tablet (2 mg total) by mouth every other day.     azithromycin 250 MG tablet  Commonly known as:  ZITHROMAX  Take 1 tablet (250 mg total) by mouth daily.  Notes to Patient:  antibiotic     benzonatate 100 MG capsule  Commonly known as:  TESSALON  Take 1 capsule (100 mg total) by mouth 3 (three) times daily as needed for cough.     cetirizine 10 MG tablet  Commonly known as:  ZYRTEC  Take 10 mg by mouth daily.     Fish Oil 1000 MG Caps  Take 2,000 mg by mouth at bedtime.  hydrocortisone valerate ointment 0.2 %  Commonly known as:  WESTCORT  Apply 1 application topically 2 (two) times daily.     living well with diabetes book Misc  1 each by Does not apply route once.     metFORMIN 500 MG tablet  Commonly known as:  GLUCOPHAGE  Take 1 tablet (500 mg total) by mouth 2 (two) times daily with a meal.  Notes to Patient:  New medication for diabetes     nystatin powder  Commonly known as:  MYCOSTATIN  Apply topically 2 (two) times daily. Apply to affected skin BID     oxyCODONE-acetaminophen 10-325 MG tablet  Commonly known as:  PERCOCET  Take 1 tablet by mouth every 8 (eight) hours as needed for pain.     polyethylene glycol packet  Commonly known as:  MIRALAX / GLYCOLAX  Take 17  g by mouth at bedtime.     potassium chloride SA 20 MEQ tablet  Commonly known as:  KLOR-CON M20  Take 2 tablets (40 mEq total) by mouth daily.     pravastatin 40 MG tablet  Commonly known as:  PRAVACHOL  Take 1 tablet (40 mg total) by mouth at bedtime.     PSYLLIUM PO  Take 2 capsules by mouth at bedtime.     tiotropium 18 MCG inhalation capsule  Commonly known as:  SPIRIVA  Place 1 capsule (18 mcg total) into inhaler and inhale daily.     torsemide 20 MG tablet  Commonly known as:  DEMADEX  Take 2 tablets (40 mg total) by mouth 2 (two) times daily.     traZODone 150 MG tablet  Commonly known as:  DESYREL  Take 1 tablet (150 mg total) by mouth 2 (two) times daily.     triamterene-hydrochlorothiazide 37.5-25 MG tablet  Commonly known as:  MAXZIDE-25  Take 1 tablet by mouth daily.     Venlafaxine HCl 225 MG Tb24  Take 225 mg by mouth daily.     vitamin C 1000 MG tablet  Take 3,000 mg by mouth at bedtime.        Discharge Instructions: Please refer to Patient Instructions section of EMR for full details.  Patient was counseled important signs and symptoms that should prompt return to medical care, changes in medications, dietary instructions, activity restrictions, and follow up appointments.   Follow-Up Appointments:   Tonette Bihari, MD 01/18/2016, 3:57 PM PGY-1, [place]

## 2016-01-17 ENCOUNTER — Inpatient Hospital Stay (HOSPITAL_COMMUNITY): Payer: Medicare Other

## 2016-01-17 DIAGNOSIS — R0600 Dyspnea, unspecified: Secondary | ICD-10-CM

## 2016-01-17 DIAGNOSIS — J440 Chronic obstructive pulmonary disease with acute lower respiratory infection: Secondary | ICD-10-CM | POA: Diagnosis not present

## 2016-01-17 LAB — HEMOGLOBIN A1C
Hgb A1c MFr Bld: 8 % — ABNORMAL HIGH (ref 4.8–5.6)
Mean Plasma Glucose: 183 mg/dL

## 2016-01-17 MED ORDER — METFORMIN HCL 500 MG PO TABS: 500.0000 mg | ORAL_TABLET | Freq: Two times a day (BID) | ORAL | Status: DC

## 2016-01-17 MED ORDER — LIVING WELL WITH DIABETES BOOK: 1.0000 | Freq: Once | Status: DC

## 2016-01-17 MED ORDER — AZITHROMYCIN 250 MG PO TABS: 250.0000 mg | ORAL_TABLET | Freq: Every day | ORAL | Status: DC

## 2016-01-17 MED ORDER — LIVING WELL WITH DIABETES BOOK
Freq: Once | Status: AC
  Administered 2016-01-17: 1
  Filled 2016-01-17: qty 1

## 2016-01-17 MED ORDER — METFORMIN HCL 500 MG PO TABS
500.0000 mg | ORAL_TABLET | Freq: Two times a day (BID) | ORAL | Status: DC
  Administered 2016-01-17: 500 mg via ORAL
  Filled 2016-01-17: qty 1

## 2016-01-17 MED ORDER — ATORVASTATIN CALCIUM 40 MG PO TABS
40.0000 mg | ORAL_TABLET | Freq: Every day | ORAL | Status: DC
  Administered 2016-01-17: 40 mg via ORAL
  Filled 2016-01-17: qty 1

## 2016-01-17 MED ORDER — TIOTROPIUM BROMIDE MONOHYDRATE 18 MCG IN CAPS: 18.0000 ug | ORAL_CAPSULE | Freq: Every day | RESPIRATORY_TRACT | Status: DC

## 2016-01-17 NOTE — Discharge Instructions (Signed)
You were admitted for shortness of breath, and were treated for possible COPD exacerbation.    1. Please start taking Spiriva inhaler daily 2. Please continue azithromycin for the next 4 days, take 1 pill daily 3. Diagnosed with diabetes during this admission, we started you on metformin 500 mg twice a day.  4. You have a follow-up appointment with Dr. Ane Payment on Friday 5. Please consider registering online with the sleep apnea association to receive a CPAP machine for a reduced cost - [email or web address]

## 2016-01-17 NOTE — Progress Notes (Signed)
Inpatient Diabetes Program Recommendations  AACE/ADA: New Consensus Statement on Inpatient Glycemic Control (2015)  Target Ranges:  Prepandial:   less than 140 mg/dL      Peak postprandial:   less than 180 mg/dL (1-2 hours)      Critically ill patients:  140 - 180 mg/dL   Review of Glycemic Control:   Results for DAO, MIEDEMA (MRN [id number]) as of 01/17/2016 09:43  Ref. Range 11/08/2014 12:19 01/16/2016 02:55  Hemoglobin A1C Latest Ref Range: 4.8-5.6 % 6.3 8.0 (H)   Diabetes history: No previous history Outpatient Diabetes medications:  None Current orders for Inpatient glycemic control:  None  Inpatient Diabetes Program Recommendations:    Note elevated A1C.  According to ADA, A1C >6/5% can be diagnostic of diabetes.  If this is a new diagnosis, please order "Survival Skill Diabetes education" while in the hospital.  Also please consider adding Novolog correction tid with meals and HS-Moderate to assess glycemic control in the hospital.  Thanks, Adah Perl, RN, BC-ADM Inpatient Diabetes Coordinator Pager [phone] (8a-5p)

## 2016-01-17 NOTE — Progress Notes (Signed)
CM received order for wheelchair, 3 in 1, rolling walker . Referral made to  [facility] for  wheelchair, 3 in 1, rolling walker. Equipment to be delivered to pt's  home on tomorrow.

## 2016-01-17 NOTE — Progress Notes (Addendum)
Family Medicine Teaching Service Daily Progress Note Intern Pager: [phone]  [name] Medical record number: [id number] Date of birth: 06-Jun-1961 Age: 55 y.o. Gender: female  Primary Care Provider: Andrena Mews, MD Consultants: None Code Status: Full  Pt Overview and Major Events to Date:  1/30: Admitted to FMTS with multiple complaints including shortness of breath  Assessment and Plan: Kathleen Roy is a 55 y.o. female presenting with shortness of breath and increased O2 requirement. PMH is significant for COPD, HTN, chronic lower extremity edema, HLD, depression, chronic back pain, OSA.  Shortness of breath: Likely CAP vs CHF vs multifactorial 2/2 COPD, OSA (not wearing CPAP), and obesity hypoventilation syndrome. Stable on 4L O2 overnight.  - Continue Azithromycin  - Continue home Albuterol 2 puffs q6hrs prn and Tessalon  - Continue Spiriva   - Continuous pulse ox for now, will switch to pulse ox with vitals in the AM, has not used BiPAP last night as she does not believe  - CXR from this AM unchanged  - Blood cultures x 2  - PT/OT eval - ECHO this afternoon  - Continue O2 therapy at 4L O2;  Dull chest pain: Likely musculoskeletal secondary to cough. Pain is reproducible with palpation of the sternum. i-stat trop 0.00 and trop I neg. Atypical in nature and does not appear to be cardiac. AM EKG showing RBBB, LAFB, appears similar to previous EKG. - Pain medications as below - Continue to monitor  T2DM:   Last HgbA1c was 6.3 in 2014, A1C  8.0 today  - Start metformin  - Diabetes education   Hypertension: BP 103/74 - Continue home Triamterene-HCTZ 37.5-25mg  bid  Chronic lower extremity edema: Takes Torsemide 40mg  bid and Metolazone 2.5mg  weekly. Venous statsis dermatitis present. AM BMP is pending. - Continue home Torsemide 40mg  bid and K-Dur 57mEq daily - Daily weights - Monitor I/Os - Consider UNNA boot. - Maintain skin care - Aquaphor ordered for TID  application  Hyperlipidemia: Last lipid panel (08/2013): Chol 141, TG 287, HDL 44, LDL 40. - Continue Atovorstatin  40 mg   Depression: stable - Continue home Abilify 2mg  every other day, Venlafaxine 225mg  daily  Chronic back pain:  - Continue home Lyrica 150mg  bid and Percocet 10-325 q8hrs prn  OSA: Used to wear BiPAP but can't afford it anymore - Will order CPAP while hospitalized   Stage 1 pressure ulcers: Blanching erythema that is tender to palpation on bilateral posterior thighs. Patient with morbid obeisity and bedbound. Minimal ambulation. Pressure ulcers appreciated on upper thighs. H/o stage 4 pressure ulcer on left heel (now well healed) - Continue to monitor - Frequent position changes in hospital - Wound care as appropriate  FEN/GI: Heart healthy diet, Miralax daily Prophylaxis: Heparin subq  Disposition: Anticipate home tomorrow.  Subjective:  Pt states she is doing fine this morning. She is having chest pain when she coughs. She did not use the CPAP overnight because she did not want to use it once she gets to outpatient.   Objective: Temp:  [97.8 F (36.6 C)-98.7 F (37.1 C)] 98.5 F (36.9 C) (02/01 1446) Pulse Rate:  [79-95] 79 (02/01 1446) Resp:  [18-20] 18 (02/01 1446) BP: (103-124)/(58-74) 103/74 mmHg (02/01 1446) SpO2:  [72 %-97 %] 95 % (02/01 1448) Weight:  [388 lb (175.996 kg)] 388 lb (175.996 kg) (02/01 0500) Physical Exam: General: Obese female, in NAD, talkative Cardiovascular: Distant heart sounds but seemingly RRR, no obvious murmurs, sternum is tender to palpation Respiratory: Distant  breath sounds, no wheezes or crackles, normal work of breathing Abdomen: Obese, non-tender, non-distended Extremities: bilaterally venous stasis, swelling in lower extremities.   Laboratory:  Recent Labs Lab 01/15/16 1826  WBC 8.0  HGB 14.0  HCT 46.1*  PLT 188    Recent Labs Lab 01/15/16 1826  NA 141  K 4.4  CL 88*  CO2 39*  BUN 17  CREATININE  0.78  CALCIUM 9.8  GLUCOSE 175*   VBG: pH 7.4, pCO2 79.6, pO2 23   Imaging/Diagnostic Tests: CXR: Cardiomegaly, cannot exclude right perihilar airspace disease/pneumonia.   Kathleen Cletis Media, MD 01/17/2016, 3:34 PM PGY-1, [place] Intern pager: [phone], text pages welcome

## 2016-01-17 NOTE — Progress Notes (Signed)
MD paged to clarify orders for new dx diabetes asking if patient will need blood sugar checks in the hospital and to start teaching CBG checks for home. Dr. Juanito Doom stated that she will not need CBG checks in the hospital.

## 2016-01-17 NOTE — Progress Notes (Signed)
MD paged and spoke with on telephone about patient needs for wheelchair and 3in1 BSC. MD also spoke with Case Manager Levada Dy.

## 2016-01-17 NOTE — Progress Notes (Signed)
[facility] PCP NOTE Ronette Deter Patient seen today. Admitted for possible PNA. She also has new diagnosis of DM2. Patient has gained few more pounds from the last time I saw her. She stated her diet has not been great. I discussed the potential for improvement of most of her health condition with weight loss. Attempt has been made in the past for nutritionist referral and bariatric surgery but she was non-compliant with management plan. I will continue to work with her whenever she is discharged from the hospital. I appreciate the care given by the inpatient team.

## 2016-01-17 NOTE — Progress Notes (Signed)
  Echocardiogram 2D Echocardiogram has been performed.  Kathleen Roy 01/17/2016, 4:49 PM

## 2016-01-17 NOTE — Progress Notes (Signed)
Pharmacist Provided - Patient Medication Education Prior to Discharge   Kathleen Roy is an 55 y.o. female who presented to [facility] on 01/15/2016 with a chief complaint of  Chief Complaint  Patient presents with  . Joint Pain  . Weakness     [x]  Patient will be discharged with 4 new medications []  Patient being discharged without any new medications  The following medications were discussed with the patient:   Pain Control medications: []  Yes    []  No  Diabetes Medications: [x]  Yes    []  No  Heart Failure Medications: []  Yes    []  No  Anticoagulation Medications:  []  Yes    []  No  Antibiotics at discharge: [x]  Yes    []  No  Allergy Assessment Completed and Updated: [x]  Yes    []  No Identified Patient Allergies:  Allergies  Allergen Reactions  . Aspirin Hives    REACTION: hives Hasn't had in years      Medication Adherence Assessment: []  Excellent (no doses missed/week)      [x]  Good (1 dose missed/week)      []  Partial (2-3 doses missed/week)      []  Poor (>3 doses missed/week)  Barriers to Obtaining Medications: []  Yes [x]  No   Assessment: Discussed new start medications including azithromycin and metformin including potential SE with the medications.  Discussed diabetes counseling including what the A1C means, and monitoring blood glucose. Pt demonstrated no cost or adherence barriers with her medication regimen.   Time spent preparing for discharge counseling: 10 min Time spent counseling patient: 10 min  Kem Parkinson, PharmD 01/17/2016, 5:49 PM

## 2016-01-17 NOTE — Progress Notes (Addendum)
Kathleen Roy to be D/C'd Home per MD order.  Discussed with the patient and all questions fully answered.  VSS  IV catheter discontinued intact. Site without signs and symptoms of complications. Dressing and pressure applied.  An After Visit Summary was printed and given to the patient. Patient received prescription.  D/c education completed with patient/family including follow up instructions, medication list, d/c activities limitations if indicated, with other d/c instructions as indicated by MD - patient able to verbalize understanding, all questions fully answered.   Patient instructed to return to ED, call 911, or call MD for any changes in condition.   Patient to be escorted via [place], and D/C home via private auto. Patient stated she can get into her home and refused PTAR services for transportation.   L'ESPERANCE, [name] 01/17/2016 5:46 PM

## 2016-01-17 NOTE — Progress Notes (Signed)
Physical Therapy Treatment Patient Details Name: Kathleen Roy MRN: [id number] DOB: [birth year] Today's Date: 01/17/2016    History of Present Illness Pt adm with SOB likely due to PNA vs CHF. PMH - morbid obesity, HTN, bil DJD of knees, chronic lower extremity edema, COPD    PT Comments    Pt presents with increased activity tolerance as evident with gait training progression with RW.  Pt requires cues for safety to use O2 when ambulating to maintain SPO2 > 90%.  Pt presents with decreased anxiety and requires cues to focus on tasks to improve safety with mobility.  Will inform supervising PT that pt would benefit from RW use at home to provide additional support and conserve energy with mobility.    Follow Up Recommendations  No PT follow up;Supervision for mobility/OOB     Equipment Recommendations  Rolling walker with 5" wheels (Bariatric)    Recommendations for Other Services       Precautions / Restrictions Precautions Precautions: Fall Restrictions Weight Bearing Restrictions: No    Mobility  Bed Mobility Overal bed mobility:  (pt recieved in recliner chair.  )                Transfers Overall transfer level: Needs assistance Equipment used: Rolling walker (2 wheeled) Transfers: Sit to/from Stand Sit to Stand: Min guard         General transfer comment: Pt required repeated attempts to complete transfer from chair.  Pt demonstrated rocking motion forward to ascend from seated surface and required cues to push from seated surface and reach back before descending to seated surface.    Ambulation/Gait Ambulation/Gait assistance: Min guard Ambulation Distance (Feet): 54 Feet Assistive device: Rolling walker (2 wheeled) Gait Pattern/deviations: Step-through pattern;Wide base of support;Decreased step length - right;Decreased step length - left;Shuffle         Stairs            Wheelchair Mobility    Modified Rankin (Stroke Patients Only)        Balance     Sitting balance-Leahy Scale: Good       Standing balance-Leahy Scale: Poor                      Cognition Arousal/Alertness: Awake/alert Behavior During Therapy: WFL for tasks assessed/performed Overall Cognitive Status: Within Functional Limits for tasks assessed                      Exercises      General Comments        Pertinent Vitals/Pain Pain Assessment: No/denies pain    Home Living                      Prior Function            PT Goals (current goals can now be found in the care plan section) Acute Rehab PT Goals Patient Stated Goal: Return home Potential to Achieve Goals: Good Progress towards PT goals: Progressing toward goals    Frequency  Min 3X/week    PT Plan      Co-evaluation             End of Session Equipment Utilized During Treatment: Oxygen;Gait belt Activity Tolerance: Patient limited by fatigue Patient left: in chair;with call bell/phone within reach     Time: 1035-1102 PT Time Calculation (min) (ACUTE ONLY): 27 min  Charges:  $Gait Training: 8-22 mins $Therapeutic Activity: 8-22 mins  G Codes:      Cristela Blue 01/17/2016, 11:09 AM  Governor Rooks, PTA pager [phone]

## 2016-01-17 NOTE — Evaluation (Signed)
Occupational Therapy Evaluation Patient Details Name: Kathleen Roy MRN: [id number] DOB: August 30, 1961 Today's Date: 01/17/2016    History of Present Illness Pt adm with SOB likely due to PNA vs CHF. PMH - morbid obesity, HTN, bil DJD of knees, chronic lower extremity edema, COPD   Clinical Impression   Pt was dependent in ADL and IADL prior to admission.  She ambulated short distances with a cane with difficulty.  Pt is likely functioning very near her baseline.  Made recommendations for bathroom equipment and began educating pt in energy conservation strategies and the importance of using her 02 consistently.  Will follow acutely.    Follow Up Recommendations  No OT follow up    Equipment Recommendations       Recommendations for Other Services       Precautions / Restrictions Precautions Precautions: Fall Restrictions Weight Bearing Restrictions: No      Mobility Bed Mobility              General bed mobility comments: Pt in chair.  Transfers Overall transfer level: Needs assistance Equipment used: Rolling walker (2 wheeled) Transfers: Sit to/from Stand Sit to Stand: Min guard         General transfer comment: Uses momentum and heavily reliant on UEs to stand from recliner and BSC    Balance     Sitting balance-Leahy Scale: Good       Standing balance-Leahy Scale: Poor                              ADL Overall ADL's : Pt requiring assistance for bathing, dressing. She is self feeding and grooming with set up. Min guard assist for toileting.                                       General ADL Comments: Pt does not wear socks and/or shoes at home due to the size of her feet. States her bathroom will not accommodate a new 3 in 1 or tub transfer bench.  Educated pt in importance of keeping her 15 on with all activity, she tends to remove it when sitting in chair and walking to the bathroom at home.      Vision     Perception      Praxis      Pertinent Vitals/Pain Pain Assessment: No/denies pain     Hand Dominance Right   Extremity/Trunk Assessment Upper Extremity Assessment Upper Extremity Assessment: Generalized weakness (new mild tremor)   Lower Extremity Assessment Lower Extremity Assessment: Defer to PT evaluation   Cervical / Trunk Assessment Cervical / Trunk Assessment: Other exceptions (hx of back pain)   Communication Communication Communication: No difficulties   Cognition Arousal/Alertness: Awake/alert Behavior During Therapy: WFL for tasks assessed/performed Overall Cognitive Status: Within Functional Limits for tasks assessed                     General Comments       Exercises       Shoulder Instructions      Home Living Family/patient expects to be discharged to:: Private residence Living Arrangements: Other relatives (brother) Available Help at Discharge: Family;Available 24 hours/day Type of Home: House Home Access: Stairs to enter [employer] of Steps: 3 Entrance Stairs-Rails: Right Home Layout: One level     Bathroom Shower/Tub: Tub/shower unit  Bathroom Toilet: Standard     Home Equipment: Cane - single point;Bedside commode;Shower seat;Other (comment);Adaptive equipment (oxygen) Adaptive Equipment: Reacher;Long-handled sponge Additional Comments: pt does not consistently use 02 during activity      Prior Functioning/Environment Level of Independence: Needs assistance  Gait / Transfers Assistance Needed: amb short distances with cane. ADL's / Homemaking Assistance Needed: brother helped her with bathing and dressing and getting in tub/shower about once monthly, brother prepares convenience foods for pt   Comments: Pt can only leave her home with ambulance transportation.  Does not have a w/c and has applied for a ramp to be built.    OT Diagnosis: Generalized weakness   OT Problem List: Decreased strength;Decreased activity  tolerance;Impaired balance (sitting and/or standing);Decreased knowledge of use of DME or AE;Cardiopulmonary status limiting activity;Obesity;Increased edema   OT Treatment/Interventions:      OT Goals(Current goals can be found in the care plan section) Acute Rehab OT Goals Patient Stated Goal: Return home OT Goal Formulation: With patient Time For Goal Achievement: 01/24/16 Potential to Achieve Goals: Fair ADL Goals Pt Will Transfer to Toilet: with modified independence;ambulating;bedside commode Pt Will Perform Toileting - Clothing Manipulation and hygiene: with modified independence;with adaptive equipment;sit to/from stand Pt/caregiver will Perform Home Exercise Program: Increased strength;Both right and left upper extremity;Independently (AROM/1# weight) Additional ADL Goal #1: Pt will be knowledgeable in use of tub transfer bench and determine if her bathroom can accommodate. Additional ADL Goal #2: Pt will utilize energy conservation strategies and importance of consistent use of 02.  OT Frequency:     Barriers to D/C:            Co-evaluation              End of Session Equipment Utilized During Treatment: Rolling walker;Oxygen Nurse Communication:  (ok to give a cup of ice)  Activity Tolerance: Patient limited by fatigue Patient left: in chair;with call bell/phone within reach   Time: 1155-1227 OT Time Calculation (min): 32 min Charges:  OT General Charges $OT Visit: 1 Procedure OT Evaluation $OT Eval Moderate Complexity: 1 Procedure OT Treatments $Self Care/Home Management : 8-22 mins G-Codes:    Malka So 01/17/2016, 1:02 PM  [phone]

## 2016-01-18 ENCOUNTER — Telehealth: Payer: Self-pay | Admitting: Family Medicine

## 2016-01-18 NOTE — Telephone Encounter (Signed)
Pt called because the orders to [facility] for a heavy duty bedside commode is just a little to big for their bathroom. This needs to be 26 inches wide, 19 inches deep, and 32 inches high for this to fit in her bathroom. The second thing is that they also ordered a heavy duty wheel chair because of her weight, but it is to heavy for anyone to pick up so they are hoping they can get another heavy duty wheelchair that is a little lighter. jw

## 2016-01-18 NOTE — Telephone Encounter (Signed)
Pt only has 4 days of antibotic left. Does she need more than that?

## 2016-01-18 NOTE — Telephone Encounter (Signed)
Spoke with brother and relayed message.  Reiterated that we would see them in clinic on 01-25-16. [name],CMA

## 2016-01-18 NOTE — Telephone Encounter (Signed)
Will forward to MD to see if she can write a new order.  Kathleen Roy,CMA

## 2016-01-18 NOTE — Telephone Encounter (Signed)
That should be adequate to treat her infection.

## 2016-01-18 NOTE — Telephone Encounter (Signed)
Spoke with [facility] and patient is unable to get another size down on these products due to her size.  Her insurance wouldn't cover the lighter or smaller products since they have weight limits and they would need to document her weight for the order.  Patient is aware of this and voiced understanding. [name],CMA

## 2016-01-18 NOTE — Telephone Encounter (Signed)
Please have patient contact [facility] and see if they are willing to switch her order for her.

## 2016-01-18 NOTE — Telephone Encounter (Signed)
Will forward to MD to advise. [name],CMA  

## 2016-01-19 ENCOUNTER — Inpatient Hospital Stay: Payer: Medicare Other | Admitting: Family Medicine

## 2016-01-21 LAB — CULTURE, BLOOD (ROUTINE X 2): Culture: NO GROWTH

## 2016-01-22 ENCOUNTER — Telehealth: Payer: Self-pay | Admitting: Family Medicine

## 2016-01-22 LAB — CULTURE, BLOOD (ROUTINE X 2): Culture: NO GROWTH

## 2016-01-22 NOTE — Telephone Encounter (Signed)
Will forward to MD to advise.  I know that diarrhea is normal with metformin and will check with MD.  Johnney Ou

## 2016-01-22 NOTE — Telephone Encounter (Signed)
New diagnosed DM. Started on Metformin in the hospital last week and now is on her 5th day will diarrhea. Please advise.

## 2016-01-22 NOTE — Telephone Encounter (Signed)
Spoke with patient and she states that she is taking 500mg  BID.  She has an appt for 01-25-16 with Dr. Lincoln Brigham for her hospital follow up.  She stated that her diarrhea has gotten a little better over the past few days. [name],CMA

## 2016-01-22 NOTE — Telephone Encounter (Signed)
Likely due to Metformin. Should get better in few days to week. Is she still on 500 mg BID or 1000mg  BID? If taking 1000 mg BID have her cut back to 500 mg BID. See me soon for follow up.

## 2016-01-24 ENCOUNTER — Telehealth: Payer: Self-pay | Admitting: Family Medicine

## 2016-01-24 ENCOUNTER — Other Ambulatory Visit: Payer: Self-pay | Admitting: Family Medicine

## 2016-01-24 MED ORDER — GLIPIZIDE 5 MG PO TABS: 5.0000 mg | ORAL_TABLET | Freq: Every day | ORAL | Status: DC

## 2016-01-24 NOTE — Telephone Encounter (Signed)
I spoke with patient about her Glipizide. I will also send her glucometer since she stated she does not have one.   Patient also discussed a different issue. Since my schedule was switched to morning from afternoon she has been having difficulty to come for her clinic appointments. She stated for her to get ready for any appointments it takes her about 5 hours. She asked if she can switch her PCP to another provider. I was able to reason with Ms. Jaylean, she has not been able to see me for her follow up appointment in few months due to my schedule change. I do agree with her for a PCP change and I advised her that this would have to go through the clinic directors. She verbalized understanding.  I will forward this note to [facility].

## 2016-01-24 NOTE — Telephone Encounter (Signed)
Have her d/c Metformin and Switch to Glipizide. Please ensure she has a glucometer so she can check her CBG at home. Hold Glipizide if glucose is less than 100. I have e-scribed glipizide. Come for follow up as soon as possible.

## 2016-01-24 NOTE — Telephone Encounter (Signed)
Pt calling and states that glipizide has been received at pharmacy but glucometer has NOT been.  She would like to know when to expect meter; additionally she would like to know if she should still start new medication although she doesn't have meter.   Please advise. Kathleen Roy, ASA

## 2016-01-24 NOTE — Telephone Encounter (Signed)
[name], MD at 01/24/2016 3:25 PM     Status: Signed       Expand All Collapse All   Have her d/c Metformin and Switch to Glipizide. Please ensure she has a glucometer so she can check her CBG at home. Hold Glipizide if glucose is less than 100. I have e-scribed glipizide. Come for follow up as soon as possible.            Valerie Roys, CMA at 01/24/2016 3:11 PM     Status: Signed       Expand All Collapse All   Spoke with patient and she states that she would be unable to come in for a visit as long as diarrhea continues. She would like to know if she should stop medication until she is able to come in that way the diarrhea can stop too. Patient is concerned about leaving her home at all with diarrhea. Will forward to MD to advise. Patient would also like to speak with provider concerning a different matter but wouldn't tell me what it was. [name],CMA             [name], MD at 01/24/2016 2:11 PM     Status: Signed       Expand All Collapse All   Patient need to come in to assess for DM and medication intolerance so appropriate changes can be made. She need to make her hospital follow up appointment.            Valerie Roys, CMA at 01/24/2016 2:04 PM     Status: Signed       Expand All Collapse All   Spoke with patient and she states that she is experiencing a lot of diarrhea. She cancelled her hospital follow up appt tomorrow due to "not knowing when it will hit". Patient would like to know if she has to continue to take this medication or what she can do with regards to the diarrhea. Will forward to MD. Jazmin [place] at 01/24/2016 11:54 AM     Status: Signed       Expand All Collapse All   Pt calling and would like to discuss a medical issue with Jazmin. Pt would not be specific as to what the issue is.  Sadie Reynolds, ASA

## 2016-01-24 NOTE — Telephone Encounter (Signed)
Pt calling and would like to discuss a medical issue with Jazmin. Pt would not be specific as to what the issue is.  Sadie Reynolds, ASA

## 2016-01-24 NOTE — Telephone Encounter (Signed)
Patient need to come in to assess for DM and medication intolerance so appropriate changes can be made. She need to make her hospital follow up appointment.

## 2016-01-24 NOTE — Telephone Encounter (Signed)
Spoke with patient and she states that she is experiencing a lot of diarrhea.  She cancelled her hospital follow up appt tomorrow due to "not knowing when it will hit".  Patient would like to know if she has to continue to take this medication or what she can do with regards to the diarrhea.  Will forward to MD. Kathleen Roy

## 2016-01-24 NOTE — Telephone Encounter (Signed)
Spoke with patient and she states that she would be unable to come in for a visit as long as diarrhea continues.  She would like to know if she should stop medication until she is able to come in that way the diarrhea can stop too.  Patient is concerned about leaving her home at all with diarrhea.  Will forward to MD to advise.  Patient would also like to speak with provider concerning a different matter but wouldn't tell me what it was. [name],CMA

## 2016-01-25 ENCOUNTER — Inpatient Hospital Stay: Payer: Medicare Other | Admitting: Student

## 2016-01-25 ENCOUNTER — Telehealth: Payer: Self-pay | Admitting: Family Medicine

## 2016-01-25 NOTE — Telephone Encounter (Signed)
Spoke with patient and I informed her that I spoke with pharmacy and they will be filling her glucometer today.  She also reported that she has stopped her metformin and has had no diarrhea since.  Advised patient to call us tomorrow or Monday so we can get her scheduled for a hospital follow up since her diarrhea has subsided.  Jasime Westergren,CMA

## 2016-01-25 NOTE — Telephone Encounter (Signed)
Kathleen Roy from [facility] on [address] called States pt needs rx for Test Strips and Lancets. Please advise. Kathleen Roy, ASA

## 2016-01-25 NOTE — Telephone Encounter (Signed)
Needs refill on ocycodone  Feb 12.  Please let her know when ready for pickup

## 2016-01-25 NOTE — Telephone Encounter (Signed)
Given to Tamika to fax.

## 2016-01-25 NOTE — Telephone Encounter (Signed)
Will forward to MD.  Patient has medicare and they cover the One Touch Ultra2 meter.  Please also advise if patient should start medication even before she gets her meter. [name],CMA

## 2016-01-25 NOTE — Telephone Encounter (Signed)
Please check with Tamika. I completed the script for Glucometer and gave to her yesterday.

## 2016-01-26 ENCOUNTER — Telehealth: Payer: Self-pay | Admitting: *Deleted

## 2016-01-26 ENCOUNTER — Other Ambulatory Visit: Payer: Self-pay | Admitting: Family Medicine

## 2016-01-26 ENCOUNTER — Telehealth: Payer: Self-pay | Admitting: Family Medicine

## 2016-01-26 MED ORDER — VENLAFAXINE HCL ER 150 MG PO CP24: ORAL_CAPSULE | ORAL | Status: DC

## 2016-01-26 MED ORDER — OXYCODONE-ACETAMINOPHEN 10-325 MG PO TABS: 1.0000 | ORAL_TABLET | Freq: Three times a day (TID) | ORAL | Status: DC | PRN

## 2016-01-26 NOTE — Telephone Encounter (Signed)
A new prescription for covered Effexor sent to her pharmacy. Prior authorization discarded.

## 2016-01-26 NOTE — Telephone Encounter (Signed)
Patient is aware. [name],CMA  

## 2016-01-26 NOTE — Telephone Encounter (Signed)
Advise patient her pain med script is ready for pic up.

## 2016-01-26 NOTE — Telephone Encounter (Signed)
Prior Authorization received from [facility] for Venlafaxine Er 225 mg.  PA form placed in provider box for completion. Derl Barrow, RN

## 2016-01-26 NOTE — Telephone Encounter (Signed)
Spoke with patient and she states that the pharmacy has received our fax for her test strips.  She is concerned that the same symptoms she was experiencing before her last admission are starting to return.  States that she is experiencing shakes and extreme fatigue.  Patient states that she hasn't taken any medication for her diabetes since Wednesday and hasn't been able to check her sugars yet since her meter hasn't been ready.  Advised patient to seek care in the ED over the weekend and that she can also call the on call MD if needed.  Patient voiced understanding and will seek care if symptoms worsen. [name],CMA

## 2016-01-26 NOTE — Telephone Encounter (Signed)
Wants Jasmin to call ASAP because they are having trouble with paperwork needed to get some of her supplies.  Also she has the tremors today

## 2016-01-26 NOTE — Telephone Encounter (Signed)
Patient is aware of this.  She would like to check the status of her test strips and lancets.  Maripaz Mullan,CMA

## 2016-01-29 ENCOUNTER — Telehealth: Payer: Self-pay | Admitting: Family Medicine

## 2016-01-29 NOTE — Telephone Encounter (Signed)
Will forward to [place] who is in charge of this process. Earnestene Angello,CMA

## 2016-01-29 NOTE — Telephone Encounter (Signed)
Pt called to see if a decision has been made on her changing PCP to someone here

## 2016-01-29 NOTE — Telephone Encounter (Signed)
Continued from previous message. Pt is calling to see if and when we will be changing her PCP. She has already talked with Dr. Gwendlyn Deutscher about this since she is a afternoon person and Dr. Gwendlyn Deutscher is only days. Please let patient know the status. jw

## 2016-01-31 ENCOUNTER — Telehealth: Payer: Self-pay | Admitting: Family Medicine

## 2016-01-31 NOTE — Telephone Encounter (Signed)
Have patient come in to be seen. She is yet to schedule hospital follow up appointment. May hold spiriva if this is the cause of her symptoms. Go to the hospital soon if symptomatic.

## 2016-01-31 NOTE — Telephone Encounter (Signed)
Has started taking spriva.  Now her voice is hoarse.  Could something else be called in?

## 2016-02-01 NOTE — Telephone Encounter (Signed)
LM for patient ok per DPR and voicemail.  She will need to make a hospital follow up asap.  PCP is fine with patient seeing someone else for this since patient now prefers afternoon appts.  Please let her know when she calls to schedule this with anyone who can see her on blue team and then we will figure out who her new PCP will be by then.  Thanks [employer]

## 2016-02-05 NOTE — Telephone Encounter (Signed)
See phone note for 01/31/16.  Burna Forts, BSN, RN-BC

## 2016-02-05 NOTE — Telephone Encounter (Signed)
Returned call to patient and apologized delay in responding back to her request.  Discussed PCP change process and residency teaching program (she will be changed to resident and will no longer have "permanent" doctor due to residents graduate, and she will be assigned to new PCP periodically.  Patient verbalized understanding and is agreeable to change due to her not being able to come for morning appts.  Will change PCP to Dr. Hyman Bible and change made in Epic.  Also, scheduled appt with Dr. Brett Albino for 02/14/16 at 3:30 pm for patient to establish care with new PCP. Ok with Dr. Gwendlyn Deutscher to cancel appt with Dr. Berkley Harvey and have patient see Dr. Brett Albino next week.  Patient informed.  Burna Forts, BSN, RN-BC

## 2016-02-05 NOTE — Telephone Encounter (Signed)
-----   Message from Valerie Roys, [place] sent at 02/01/2016  8:24 AM EST ----- Hey any update on changing this patient's PCP?   ----- Message -----    From: [name], MD    Sent: 02/01/2016   8:09 AM      To: Valerie Roys, CMA  Ok, please help work with Jeanett Schlein to facilitate it. Please let her know we will not encourage telephone consult. She need to be seen, check vitals, check glucose and so on. This is for her safety and good health care. ----- Message -----    From: Valerie Roys, CMA    Sent: 02/01/2016   8:04 AM      To: [name], MD  When I spoke with patient last week she said that she didn't want to make an appt until she knew who her new PCP would be.  I will try calling her again and also speak with Tomasa Hosteller to see the status of her PCP change.    ----- Message -----    From: [name], MD    Sent: 01/31/2016  12:05 PM      To: [facility]  Please contact patient to schedule follow up appointment. Please advise her that we might not be able to appropriately respond to her question about medication management if not seen in the clinic soon. Thanks.

## 2016-02-06 ENCOUNTER — Inpatient Hospital Stay: Payer: Medicare Other | Admitting: Family Medicine

## 2016-02-14 ENCOUNTER — Ambulatory Visit (INDEPENDENT_AMBULATORY_CARE_PROVIDER_SITE_OTHER): Payer: Medicare Other | Admitting: Internal Medicine

## 2016-02-14 ENCOUNTER — Encounter: Payer: Self-pay | Admitting: Internal Medicine

## 2016-02-14 VITALS — BP 133/71 | HR 86 | Temp 98.1°F

## 2016-02-14 DIAGNOSIS — R251 Tremor, unspecified: Secondary | ICD-10-CM | POA: Diagnosis not present

## 2016-02-14 DIAGNOSIS — L8990 Pressure ulcer of unspecified site, unspecified stage: Secondary | ICD-10-CM

## 2016-02-14 DIAGNOSIS — E1121 Type 2 diabetes mellitus with diabetic nephropathy: Secondary | ICD-10-CM

## 2016-02-14 DIAGNOSIS — J438 Other emphysema: Secondary | ICD-10-CM

## 2016-02-14 DIAGNOSIS — G894 Chronic pain syndrome: Secondary | ICD-10-CM | POA: Diagnosis present

## 2016-02-14 DIAGNOSIS — E119 Type 2 diabetes mellitus without complications: Secondary | ICD-10-CM | POA: Insufficient documentation

## 2016-02-14 MED ORDER — FLUTICASONE-SALMETEROL 100-50 MCG/DOSE IN AEPB
1.0000 | INHALATION_SPRAY | Freq: Two times a day (BID) | RESPIRATORY_TRACT | Status: DC
Start: 2016-02-14 — End: 2016-02-23

## 2016-02-14 MED ORDER — PROPRANOLOL HCL 40 MG PO TABS: 40.0000 mg | ORAL_TABLET | Freq: Two times a day (BID) | ORAL | Status: DC

## 2016-02-14 MED ORDER — GLIPIZIDE 5 MG PO TABS: 5.0000 mg | ORAL_TABLET | Freq: Every day | ORAL | Status: DC

## 2016-02-14 MED ORDER — OXYCODONE-ACETAMINOPHEN 10-325 MG PO TABS: 1.0000 | ORAL_TABLET | Freq: Three times a day (TID) | ORAL | Status: DC | PRN

## 2016-02-14 NOTE — Assessment & Plan Note (Signed)
Cannot safely ambulate at home. Would like wheelchair for use outside the home and walker for use inside the home, as a wheelchair will not fit inside their home. - Orders placed for DME wheelchair and walker. Faxed to [facility]. - May need to consider home health aide in the future

## 2016-02-14 NOTE — Assessment & Plan Note (Signed)
Pressure ulcers located on her lower extremities. She spends 4 hours sitting up in a chair and the rest of the time is spent in bed. Lower extremities also very dry. - Encouraged increasing use of moisturizer from once a day to twice a day - Order placed for DME pressure cushion - Follow-up in 3 months

## 2016-02-14 NOTE — Patient Instructions (Signed)
It was so nice to see you again!  I have put in orders for a wheelchair, walker, and a cushion to help distribute pressure. Please take these to [facility] at [address] in [place].  I have refilled your Percocet and Glipizide.  I have started 2 new medications- Propranolol for the tremors and Advair for the COPD.  Please keep a close eye on your toe. If it is getting bigger or looks like it's spreading, please come back to be seen.  Please continue to write down your blood sugars. We will re-check your hemoglobin A1c at your next visit.  Please come back to see me in 3 months.  - Dr. Brett Albino

## 2016-02-14 NOTE — Assessment & Plan Note (Signed)
Stable - Refilled Percocet

## 2016-02-14 NOTE — Progress Notes (Signed)
[facility] Phone: [phone]  Subjective:  Diabetes: Taking Glipizide 5mg  daily. Stopped taking the Metformin because it gave her horrible diarrhea. She checks her blood sugar twice a day. CBGs at breakfast 107-126, CBGs at dinner 99-145. She has been eating between 40-60 carbs at each meal. No dizziness, no sweatiness, no feeling like she is going to pass out.  Tremors: No family history of tremors, except Dad who had Lewy Body dementia. Have been going on for a couple months. Worse when she first wakes up. Mainly located in the hands. No changes in mental status.  COPD: Stopped taking her Spiriva because it causes her to lose her voice. Would like to try something else. Uses 2L O2 at home. No changes in shortness of breath recently.  Difficulty caring for herself at home: Pt has a difficult time getting around her house because of her size, lower extremity swelling, and because she cannot feel her feet. She states she can barely stand. She lives with her brother, who is her primary care taker. States she would like a wheelchair for use outside the home. Per brother, a wheelchair will not fit in their home, so she would also like a walker to use to get around the house.  ROS: See HPI for pertinent positives and negatives Past Medical History- newly diagnosed T2DM, HTN, severe venous insufficiency. Reviewed problem list.  Medications- reviewed and updated Current Outpatient Prescriptions  Medication Sig Dispense Refill  . albuterol (PROVENTIL HFA;VENTOLIN HFA) 108 (90 BASE) MCG/ACT inhaler Inhale 2 puffs into the lungs every 6 (six) hours as needed for wheezing or shortness of breath. 18 g 3  . ARIPiprazole (ABILIFY) 2 MG tablet Take 1 tablet (2 mg total) by mouth every other day. 45 tablet 1  . Ascorbic Acid (VITAMIN C) 1000 MG tablet Take 3,000 mg by mouth at bedtime.     Marland Kitchen azithromycin (ZITHROMAX) 250 MG tablet Take 1 tablet (250 mg total) by mouth daily. 4  tablet 0  . benzonatate (TESSALON) 100 MG capsule Take 1 capsule (100 mg total) by mouth 3 (three) times daily as needed for cough. 20 capsule 0  . cetirizine (ZYRTEC) 10 MG tablet Take 10 mg by mouth daily.    Marland Kitchen glipiZIDE (GLUCOTROL) 5 MG tablet Take 1 tablet (5 mg total) by mouth daily. 30 tablet 1  . hydrocortisone valerate ointment (WESTCORT) 0.2 % Apply 1 application topically 2 (two) times daily. 45 g 0  . living well with diabetes book MISC 1 each by Does not apply route once. 1 each 0  . metFORMIN (GLUCOPHAGE) 500 MG tablet Take 1 tablet (500 mg total) by mouth 2 (two) times daily with a meal. 60 tablet 1  . nystatin (MYCOSTATIN) powder Apply topically 2 (two) times daily. Apply to affected skin BID (Patient taking differently: Apply 1 g topically 2 (two) times daily. Apply to affected skin BID) 360 g 6  . Omega-3 Fatty Acids (FISH OIL) 1000 MG CAPS Take 2,000 mg by mouth at bedtime.    Marland Kitchen oxyCODONE-acetaminophen (PERCOCET) 10-325 MG tablet Take 1 tablet by mouth every 8 (eight) hours as needed for pain. 90 tablet 0  . polyethylene glycol (MIRALAX / GLYCOLAX) packet Take 17 g by mouth at bedtime.     . potassium chloride SA (KLOR-CON M20) 20 MEQ tablet Take 2 tablets (40 mEq total) by mouth daily. 180 tablet 0  . pravastatin (PRAVACHOL) 40 MG tablet Take 1 tablet (40 mg total) by mouth at  bedtime. 90 tablet 1  . PSYLLIUM PO Take 2 capsules by mouth at bedtime.    Marland Kitchen tiotropium (SPIRIVA) 18 MCG inhalation capsule Place 1 capsule (18 mcg total) into inhaler and inhale daily. 30 capsule 12  . torsemide (DEMADEX) 20 MG tablet Take 2 tablets (40 mg total) by mouth 2 (two) times daily. (Patient taking differently: Take 80 mg by mouth daily. ) 360 tablet 1  . traZODone (DESYREL) 150 MG tablet Take 1 tablet (150 mg total) by mouth 2 (two) times daily. (Patient taking differently: Take 300 mg by mouth at bedtime. ) 180 tablet 1  . triamterene-hydrochlorothiazide (MAXZIDE-25) 37.5-25 MG tablet Take 1  tablet by mouth daily. 90 tablet 1  . venlafaxine XR (EFFEXOR-XR) 150 MG 24 hr capsule Use 1.5 tablets daily 45 capsule 1   No current facility-administered medications for this visit.   Chief complaint-noted Family history reviewed for today's visit. No changes. Social history- patient is a former smoker. Quit in 2010.  Objective: BP 133/71 mmHg  Pulse 86  Temp(Src) 98.1 F (36.7 C) (Oral)  Wt  Gen: NAD, alert, cooperative with exam HEENT: NCAT, EOMI, MMM Neck: FROM, supple CV: Heart sounds difficult to hear secondary to body habitus, but seemingly RRR, no obvious murmur Resp: CTABL, no wheezes, normal work of breathing, Field[place] in place GI: SNTND, BS present, no guarding or organomegaly Msk: Severe lower extremity edema Neuro: Alert and oriented, no gross deficits Skin: No rashes,  Dry skin and hyperpigmentation throughout feet and lower legs bilaterally Psych: Appropriate behavior  Assessment/Plan: Type 2 DM: Currently taking Glipizide 5mg  daily. Stopped taking Metformin because of diarrhea. Checking blood sugars twice a day. CBGs ranging from 80s-140s. No symptoms of hypoglycemia. Eating between 45-60 carbs per meal. HgbA1c was 8.0% in the hospital 1 month ago. - Continue Glipizide 5mg  daily - Continue checking blood sugars twice a day and monitoring for signs of hypoglycemia - Will recheck HgbA1c at next office visit - Follow-up in 3 months  Tremors: Has been seen by Neurology in the past. Tremors worse in the morning. No new meds that could be contributing. - Will try Propranolol 40mg  bid and titrate up as needed - Will stop Propranolol if it is not helping - Follow-up in 3 months  COPD: Using 2L O2 at home. Was started on restarted on Spiriva on discharge from the hospital. Stopped taking Spiriva because it causes her to have hoarseness. Normal work of breathing on exam today. - Will try Advair to see if she tolerates this better - Continue Albuterol prn - Follow-up in 3  months.  Morbid obesity: Cannot safely ambulate at home. Would like wheelchair for use outside the home and walker for use inside the home, as a wheelchair will not fit inside their home. - Orders placed for DME wheelchair and walker. Faxed to [facility]. - May need to consider home health aide in the future  Pressure ulcers on lower extremities: Pressure ulcers located on her lower extremities. She spends 4 hours sitting up in a chair and the rest of the time is spent in bed. Lower extremities also very dry. - Encouraged increasing use of moisturizer from once a day to twice a day - Order placed for DME pressure cushion - Follow-up in 3 months    Hyman Bible, MD PGY-1

## 2016-02-14 NOTE — Assessment & Plan Note (Addendum)
Currently taking Glipizide 5mg  daily. Stopped taking Metformin because of diarrhea. Checking blood sugars twice a day. CBGs ranging from 80s-140s. No symptoms of hypoglycemia. Eating between 45-60 carbs per meal. HgbA1c was 8.0% in the hospital 1 month ago. - Continue Glipizide 5mg  daily - Continue checking blood sugars twice a day and monitoring for signs of hypoglycemia - Will recheck HgbA1c at next office visit - Follow-up in 3 months

## 2016-02-14 NOTE — Assessment & Plan Note (Signed)
Has been seen by Neurology in the past. Tremors worse in the morning. No new meds that could be contributing. - Will try Propranolol 40mg  bid and titrate up as needed - Will stop Propranolol if it is not helping - Follow-up in 3 months

## 2016-02-14 NOTE — Assessment & Plan Note (Signed)
Using 2L O2 at home. Was started on restarted on Spiriva on discharge from the hospital. Stopped taking Spiriva because it causes her to have hoarseness. Normal work of breathing on exam today. - Will try Advair to see if she tolerates this better - Continue Albuterol prn - Follow-up in 3 months.

## 2016-02-16 ENCOUNTER — Encounter: Payer: Self-pay | Admitting: Internal Medicine

## 2016-02-16 ENCOUNTER — Telehealth: Payer: Self-pay | Admitting: *Deleted

## 2016-02-16 NOTE — Telephone Encounter (Signed)
Patient called stating she just took her blood sugar is 88.  Per patient this is her before breakfast time.  She has not had anything to eat other than a diet soda. Patient wanted to know if she should hold her glipizide.  Per Dr. Brett Albino patient should her glipizide, recheck her blood sugar again before dinner.  If blood sugar is ok then she could take the medication. If still low to continue hold the medication. Pt stated understanding.  Derl Barrow, RN

## 2016-02-21 ENCOUNTER — Telehealth: Payer: Self-pay | Admitting: Internal Medicine

## 2016-02-21 NOTE — Telephone Encounter (Signed)
Pt calls, the appeal for Venlafaxine was denied due to "missing info" on the form. Please resubmit with missing info completed and mark as "Urgent" within the next 24-48 hrs. Please fax to [phone]. Also, Advair is not covered under pt's insurance. Two options that are covered are Breo and Symbicort. Please sent one of these in in replace of Advair.

## 2016-02-22 NOTE — Telephone Encounter (Signed)
Will forward to MD to advise on the replacement inhaler.  Also will need to know when this form was faxed roughly so we can pull the original to have it completed. Fontella Shan,CMA

## 2016-02-23 MED ORDER — FLUTICASONE FUROATE-VILANTEROL 100-25 MCG/INH IN AEPB: 1.0000 | INHALATION_SPRAY | Freq: Every day | RESPIRATORY_TRACT | Status: DC

## 2016-02-23 NOTE — Telephone Encounter (Signed)
Replaced Advair with Breo. Awaiting information from patient regarding fax date of original form for venlafaxine.

## 2016-02-27 ENCOUNTER — Encounter: Payer: Self-pay | Admitting: Internal Medicine

## 2016-03-01 ENCOUNTER — Other Ambulatory Visit: Payer: Self-pay | Admitting: Internal Medicine

## 2016-03-01 ENCOUNTER — Telehealth: Payer: Self-pay | Admitting: Internal Medicine

## 2016-03-01 NOTE — Telephone Encounter (Signed)
About a month ago Dr Gwendlyn Deutscher ordered pt a wheelchair.  [facility] came with the wheelchair but pt had to refuse it because [name] could not lift it.  After doing research, pt found [facility] has a transport chair.  Description on the chair on [facility] website: "Ultra light bariatric transport chair."  22 inches and part number is [id number].  It is currently on sale, They also have a oxygen holder that will clip on to the chair----O2 holder is the name. Item number is [id number]. This is what pt wants Dr to order for her from [facility].

## 2016-03-04 ENCOUNTER — Telehealth: Payer: Self-pay | Admitting: Internal Medicine

## 2016-03-04 NOTE — Telephone Encounter (Signed)
Pt is calling to check the status of her request on 03/01/16 for the special wheelchair. The information for the part number and everything to do with the chair in question is in that note. Please fax ASAP. Blima Rich

## 2016-03-05 ENCOUNTER — Telehealth: Payer: Self-pay | Admitting: Internal Medicine

## 2016-03-05 NOTE — Telephone Encounter (Signed)
Levada Dy with [employer] called to say that they do not have the Ultra Light Bariatric Chair.  Only carry the standard one that is up to 300 lbs.  Need new order for the standard as long as patient is not over 300 lbs.

## 2016-03-05 NOTE — Telephone Encounter (Signed)
Will forward to MD to advise. [name],CMA  

## 2016-03-05 NOTE — Telephone Encounter (Signed)
Spoke with Levada Dy and she states that they don't carry all models that are sold online.  Patient would have to pay out of pocket.  Informed patient of this and she is aware and will continue to look around.  I did advise her to look around for organizations that provide help with building ramps or even if they provide a ramp to help to get the chair in the car.  Patient voiced understanding and will let us know if she finds anything. Dorinne Graeff,CMA

## 2016-03-07 ENCOUNTER — Other Ambulatory Visit: Payer: Self-pay | Admitting: Internal Medicine

## 2016-03-07 ENCOUNTER — Telehealth: Payer: Self-pay | Admitting: *Deleted

## 2016-03-07 NOTE — Telephone Encounter (Signed)
Patient called stating her blood sugar was 88 and she wanted to know if she should hold her glipizide.  She just recently woke, stating her timing is always off.  She has not had anything to eat yet because it takes her a while to completely wake up.  Per Dr. Brett Albino if her blood glucose levels are below 100 to hold her medication.  Make sure has eat as soon as possible and recheck her blood sugar.  Derl Barrow, RN

## 2016-03-08 ENCOUNTER — Telehealth: Payer: Self-pay | Admitting: Internal Medicine

## 2016-03-08 NOTE — Telephone Encounter (Signed)
Will forward to MD to make her aware.  [name],CMA  

## 2016-03-08 NOTE — Telephone Encounter (Signed)
Please let Kathleen Roy know that I have refilled her medications. I cannot send her Lyrica electronically, but she can pick it up at our front desk.

## 2016-03-08 NOTE — Telephone Encounter (Signed)
In reference to an appeal filed for venlafaxine [facility] by Dr Brett Albino,  Pt received a letter today stating it was approved.   A letter is being sent to dr Brett Albino also.  It is approved thru the end of the year

## 2016-03-11 NOTE — Telephone Encounter (Signed)
Spoke with patient and she would like for the lyrica to be called in. [facility]

## 2016-03-14 ENCOUNTER — Telehealth: Payer: Self-pay | Admitting: Internal Medicine

## 2016-03-14 NOTE — Telephone Encounter (Signed)
Will forward to MD. Mckayla Mulcahey,CMA  

## 2016-03-14 NOTE — Telephone Encounter (Addendum)
Ms. Hiltner is asking for a prescription be sent to [facility].  She need you indicate that patient need to have a transport chair (not wheelchair) and give reason for why chair is needed.  Be specific in details and then fax to [phone]  Attn: Donata Clay

## 2016-03-26 ENCOUNTER — Other Ambulatory Visit: Payer: Self-pay | Admitting: Internal Medicine

## 2016-03-26 ENCOUNTER — Telehealth: Payer: Self-pay | Admitting: Internal Medicine

## 2016-03-26 DIAGNOSIS — J449 Chronic obstructive pulmonary disease, unspecified: Secondary | ICD-10-CM

## 2016-03-26 NOTE — Telephone Encounter (Signed)
Will forward to MD to see what she suggests.  After speaking with a preceptor Dr. Nori Riis suggested getting a home health assessment done for the best option. [name],CMA

## 2016-03-26 NOTE — Telephone Encounter (Signed)
Gave pt the info below.  Advised that we are going to talk to Dr. Brett Albino to get some clarification and give her a call back.    FYI: Pt originally wanted a W/C but it was to heavy for him to lift.  She then called around and was recommended a transport chair, when she called [facility] they told her to get a rolator.  Pt is concerned that this is not going to support her weight.  She states " I just want something that [name] can push me around in so I dont have to walk, im not sure the rolator is going to hold my weight:. Kathleen Roy, [name], CMA

## 2016-03-26 NOTE — Telephone Encounter (Signed)
Order received from [facility].  Spoke with preceptor and she didn't feel comfortable signing the order until we know that this option is safe for the patient.  Tried to call patient but had to leave a message.  [name],CMA

## 2016-03-26 NOTE — Telephone Encounter (Signed)
Please let Ms. Kathleen Roy know that I have ordered a home health physical therapist to come out to her house and evaluate her to see which wheelchair would be most appropriate. I have also requested that she be evaluated to see if she would benefit from a home health aide. Thank you!

## 2016-03-26 NOTE — Telephone Encounter (Signed)
Pt is calling because she is suppose to get her Rolator today, but [employer] is wanting some signatures. Patient would like [place] to call them and see exactly what they need because if these questions or signature is done today she can have this delivered today. jw

## 2016-03-27 NOTE — Telephone Encounter (Signed)
Spoke with Kathleen Roy with [facility] and she confirmed that the rolator walker is a bariatric one and will hold up to 500lbs.  Patient has already been made aware of this and would like to order it since it will help her with walking in the home and also give her a place to rest.  Will still let home health come out and assess patient for any aide options.  Spoke with Dr. Gwendlyn Deutscher, AM preceptor and explained the situation and order has been signed and faxed back to appropriate person. Kathleen Roy,CMA

## 2016-04-02 ENCOUNTER — Other Ambulatory Visit: Payer: Self-pay | Admitting: *Deleted

## 2016-04-02 MED ORDER — PRAVASTATIN SODIUM 40 MG PO TABS: 40.0000 mg | ORAL_TABLET | Freq: Every day | ORAL | Status: DC

## 2016-04-02 MED ORDER — TRAZODONE HCL 150 MG PO TABS: 300.0000 mg | ORAL_TABLET | Freq: Every day | ORAL | Status: DC

## 2016-04-02 NOTE — Telephone Encounter (Signed)
Medications refilled

## 2016-04-11 NOTE — Telephone Encounter (Addendum)
Need to get another order placed for a bariartric bedside commode.  Need to send to [facility].  The brand name of the commode is "Drive".  Their fax # is [phone].  Include patient's weight with order.  Patient stated that she guess her approximate weight is 375 lbs.

## 2016-04-11 NOTE — Telephone Encounter (Signed)
Will forward to MD to write a new order for this seat. Velora Horstman,CMA

## 2016-04-12 NOTE — Telephone Encounter (Signed)
Please let Ms. Hiemenz know that I have faxed over the order for the bedside commode today. Thank you!

## 2016-04-30 ENCOUNTER — Telehealth: Payer: Self-pay | Admitting: Internal Medicine

## 2016-04-30 DIAGNOSIS — G894 Chronic pain syndrome: Secondary | ICD-10-CM

## 2016-04-30 NOTE — Telephone Encounter (Signed)
Pt called and really needs to talk to Dr. Brett Albino only. She is very depressed and just not being herself. She doesn't like feeling this way and just needs some help. Please call to talk to her anytime after noon. jw

## 2016-05-01 MED ORDER — OXYCODONE-ACETAMINOPHEN 10-325 MG PO TABS: 1.0000 | ORAL_TABLET | Freq: Four times a day (QID) | ORAL | Status: DC | PRN

## 2016-05-01 MED ORDER — VENLAFAXINE HCL ER 150 MG PO CP24: ORAL_CAPSULE | ORAL | Status: DC

## 2016-05-01 NOTE — Addendum Note (Signed)
Addended by: Hyman Bible D on: 05/01/2016 04:10 PM   Modules accepted: Orders

## 2016-05-01 NOTE — Addendum Note (Signed)
Addended by: Hyman Bible D on: 05/01/2016 04:12 PM   Modules accepted: Orders

## 2016-05-01 NOTE — Addendum Note (Signed)
Addended by: Hyman Bible D on: 05/01/2016 04:07 PM   Modules accepted: Orders

## 2016-05-01 NOTE — Telephone Encounter (Signed)
Returned patient's call. She is having worsening depression and chronic pain. Discussed referral to pain clinic, but she declined at this time. She has been seen by them in the past and they told her they could not help her.   -Will increase Percocet frequency from tid to qid. -Will increase Venlafaxine from 225mg  daily to 300mg  daily. Pt states her depression was better when she was on 300mg  previously.  Hyman Bible, MD PGY-1

## 2016-05-07 ENCOUNTER — Other Ambulatory Visit: Payer: Self-pay | Admitting: Internal Medicine

## 2016-05-09 ENCOUNTER — Telehealth: Payer: Self-pay | Admitting: Internal Medicine

## 2016-05-09 NOTE — Telephone Encounter (Signed)
Need to discuss the pain she's been having.  Do not want to discuss this with anyone else but provider.  Please contact patient after 12:00 pm

## 2016-05-10 NOTE — Telephone Encounter (Signed)
Pt and pt brother called trying to get in touch with dr. Brett Albino about pts leg pain. Per tamika she was on post call and may not respond. Pt and brother was advised by me and tamika to take her to the ED for her pain. Brother stated that he has been trying to get her to go and pt refused. Deseree Kennon Holter, CMA

## 2016-05-10 NOTE — Telephone Encounter (Signed)
Will forward to MD. Alixandra Alfieri,CMA  

## 2016-05-15 NOTE — Telephone Encounter (Signed)
Spoke with Pt over the phone. She states she has been having "excruiciating" right sided leg pain in the front of her leg and the back of her knee that started a few days ago. She cannot stand on the leg. She cannot tell if the leg is red because she has redness at baseline. The area around her knee feels "tight" and "swollen". She feels like the pain has continued to get worse.   Pt states that she has not left the house since her last visit with me on 02/14/16. Given that she is immobile and she is having unilateral leg swelling and possibly erythema, advised Pt to go to the ED for further work-up to rule out DVT.  Hyman Bible, MD PGY-1

## 2016-05-22 ENCOUNTER — Encounter (HOSPITAL_COMMUNITY): Payer: Self-pay | Admitting: *Deleted

## 2016-05-22 ENCOUNTER — Emergency Department (HOSPITAL_COMMUNITY)
Admission: EM | Admit: 2016-05-22 | Discharge: 2016-05-23 | Disposition: A | Discharge status: Home / Self Care | Payer: Medicare Other | Attending: Emergency Medicine | Admitting: Emergency Medicine

## 2016-05-22 DIAGNOSIS — L03115 Cellulitis of right lower limb: Secondary | ICD-10-CM

## 2016-05-22 DIAGNOSIS — M5441 Lumbago with sciatica, right side: Secondary | ICD-10-CM | POA: Insufficient documentation

## 2016-05-22 DIAGNOSIS — Z79891 Long term (current) use of opiate analgesic: Secondary | ICD-10-CM | POA: Diagnosis not present

## 2016-05-22 DIAGNOSIS — I10 Essential (primary) hypertension: Secondary | ICD-10-CM | POA: Insufficient documentation

## 2016-05-22 DIAGNOSIS — J449 Chronic obstructive pulmonary disease, unspecified: Secondary | ICD-10-CM | POA: Diagnosis not present

## 2016-05-22 DIAGNOSIS — M545 Low back pain: Secondary | ICD-10-CM | POA: Diagnosis not present

## 2016-05-22 DIAGNOSIS — M549 Dorsalgia, unspecified: Secondary | ICD-10-CM | POA: Diagnosis not present

## 2016-05-22 DIAGNOSIS — Z79899 Other long term (current) drug therapy: Secondary | ICD-10-CM | POA: Insufficient documentation

## 2016-05-22 DIAGNOSIS — Z87891 Personal history of nicotine dependence: Secondary | ICD-10-CM | POA: Diagnosis not present

## 2016-05-22 DIAGNOSIS — Z7984 Long term (current) use of oral hypoglycemic drugs: Secondary | ICD-10-CM | POA: Insufficient documentation

## 2016-05-22 DIAGNOSIS — E1140 Type 2 diabetes mellitus with diabetic neuropathy, unspecified: Secondary | ICD-10-CM | POA: Insufficient documentation

## 2016-05-22 DIAGNOSIS — Z791 Long term (current) use of non-steroidal anti-inflammatories (NSAID): Secondary | ICD-10-CM | POA: Insufficient documentation

## 2016-05-22 DIAGNOSIS — E876 Hypokalemia: Secondary | ICD-10-CM | POA: Diagnosis not present

## 2016-05-22 DIAGNOSIS — Z792 Long term (current) use of antibiotics: Secondary | ICD-10-CM | POA: Diagnosis not present

## 2016-05-22 NOTE — ED Notes (Addendum)
Pt arrives to the ER via [facility] for complaints of back pain; pt states that she has chronic back pain and knee pain; pt states that she has had increased rt sided back pain; pt states that she feels that the pain starts in her rt knee and radiates up her rt thigh to her rt buttock to her rt lower back; pt states that it feels like her rt knee is swollen; no obvious swelling or deformity to rt knee noted; pt was ambulatory from house out to EMS stretcher

## 2016-05-22 NOTE — ED Notes (Signed)
Bed: WA02 Expected date:  Expected time:  Means of arrival:  Comments: 55 yo F  Back pain

## 2016-05-23 ENCOUNTER — Telehealth: Payer: Self-pay | Admitting: *Deleted

## 2016-05-23 ENCOUNTER — Emergency Department (HOSPITAL_COMMUNITY): Payer: Medicare Other

## 2016-05-23 DIAGNOSIS — M545 Low back pain: Secondary | ICD-10-CM | POA: Diagnosis not present

## 2016-05-23 LAB — BASIC METABOLIC PANEL
Anion gap: 5 (ref 5–15)
BUN: 27 mg/dL — AB (ref 6–20)
CHLORIDE: 90 mmol/L — AB (ref 101–111)
CO2: 45 mmol/L — AB (ref 22–32)
CREATININE: 0.86 mg/dL (ref 0.44–1.00)
Calcium: 9.6 mg/dL (ref 8.9–10.3)
GFR calc Af Amer: >60 mL/min (ref >60)
GFR calc non Af Amer: >60 mL/min (ref >60)
GLUCOSE: 134 mg/dL — AB (ref 65–99)
POTASSIUM: 3.1 mmol/L — AB (ref 3.5–5.1)
SODIUM: 140 mmol/L (ref 135–145)

## 2016-05-23 LAB — CBC WITH DIFFERENTIAL/PLATELET
Basophils Absolute: 0 10*3/uL (ref 0.0–0.1)
Basophils Relative: 0 %
EOS ABS: 0 10*3/uL (ref 0.0–0.7)
EOS PCT: 0 %
HCT: 43.9 % (ref 36.0–46.0)
Hemoglobin: 13.5 g/dL (ref 12.0–15.0)
LYMPHS ABS: 1.7 10*3/uL (ref 0.7–4.0)
LYMPHS PCT: 24 %
MCH: 29.6 pg (ref 26.0–34.0)
MCHC: 30.8 g/dL (ref 30.0–36.0)
MCV: 96.3 fL (ref 78.0–100.0)
MONO ABS: 0.3 10*3/uL (ref 0.1–1.0)
MONOS PCT: 5 %
Neutro Abs: 5.2 10*3/uL (ref 1.7–7.7)
Neutrophils Relative %: 71 %
PLATELETS: 192 10*3/uL (ref 150–400)
RBC: 4.56 MIL/uL (ref 3.87–5.11)
RDW: 16.5 % — AB (ref 11.5–15.5)
WBC: 7.3 10*3/uL (ref 4.0–10.5)

## 2016-05-23 MED ORDER — DOXYCYCLINE HYCLATE 100 MG PO TABS
100.0000 mg | ORAL_TABLET | Freq: Once | ORAL | Status: AC
  Administered 2016-05-23: 100 mg via ORAL
  Filled 2016-05-23: qty 1

## 2016-05-23 MED ORDER — POTASSIUM CHLORIDE CRYS ER 20 MEQ PO TBCR
40.0000 meq | EXTENDED_RELEASE_TABLET | Freq: Once | ORAL | Status: AC
  Administered 2016-05-23: 40 meq via ORAL
  Filled 2016-05-23: qty 2

## 2016-05-23 MED ORDER — OXYCODONE-ACETAMINOPHEN 5-325 MG PO TABS
2.0000 | ORAL_TABLET | Freq: Once | ORAL | Status: AC
  Administered 2016-05-23: 2 via ORAL
  Filled 2016-05-23: qty 2

## 2016-05-23 MED ORDER — DOXYCYCLINE HYCLATE 100 MG PO CAPS: 100.0000 mg | ORAL_CAPSULE | Freq: Two times a day (BID) | ORAL | Status: DC

## 2016-05-23 MED ORDER — IBUPROFEN 800 MG PO TABS: 800.0000 mg | ORAL_TABLET | Freq: Three times a day (TID) | ORAL | Status: DC

## 2016-05-23 NOTE — Discharge Instructions (Signed)
Please take all of your antibiotics until finished!, ibuprofen and ice to affected area for back pain, continue usual home medications Please keep your scheduled appointment with your primary care physician for discussion of your diagnoses and further evaluation after today's visit; Please return to the ER for new or worsening symptoms, any additional concerns.  COLD THERAPY DIRECTIONS:  Ice or gel packs can be used to reduce both pain and swelling. Ice is the most helpful within the first 24 to 48 hours after an injury or flareup from overusing a muscle or joint.  Ice is effective, has very few side effects, and is safe for most people to use.   If you expose your skin to cold temperatures for too long or without the proper protection, you can damage your skin or nerves. Watch for signs of skin damage due to cold.   HOME CARE INSTRUCTIONS  Follow these tips to use ice and cold packs safely.  Place a dry or damp towel between the ice and skin. A damp towel will cool the skin more quickly, so you may need to shorten the time that the ice is used.  For a more rapid response, add gentle compression to the ice.  Ice for no more than 10 to 20 minutes at a time. The bonier the area you are icing, the less time it will take to get the benefits of ice.  Check your skin after 5 minutes to make sure there are no signs of a poor response to cold or skin damage.  Rest 20 minutes or more in between uses.  Once your skin is numb, you can end your treatment. You can test numbness by very lightly touching your skin. The touch should be so light that you do not see the skin dimple from the pressure of your fingertip. When using ice, most people will feel these normal sensations in this order: cold, burning, aching, and numbness.

## 2016-05-23 NOTE — Telephone Encounter (Signed)
Conflicting stories from [name] (from ED) and pharmacist.  She wants to clarify how often and if she can indeed take the ibuprofen and percocet together.  Spoke with Dr. Gwendlyn Deutscher she advised to take the percocet and ibuprofen at least 2 hours apart.  Pt informed and agreeable. [place], [name], CMA

## 2016-05-23 NOTE — ED Provider Notes (Signed)
CSN: [id number]     Arrival date & time 05/22/16  2315 History   First MD Initiated Contact with Patient 05/23/16 0002     Chief Complaint  Patient presents with  . Back Pain    (Consider location/radiation/quality/duration/timing/severity/associated sxs/prior Treatment) Patient is a 55 y.o. female presenting with back pain. The history is provided by the patient and medical records. No language interpreter was used.  Back Pain Associated symptoms: no abdominal pain, no dysuria, no fever and no weakness     Kathleen Roy is a 55 y.o. female  with a PMH of chronic low back, fibromyalgia, morbid obesity, HTN, COPD, arthritis, DM and peripheral neuropathy who presents to the Emergency Department complaining of an acute worsening of chronic low back pain described as sharp which radiates down right leg to mid thigh x 1 week. Home oxycodone taken with minimal relief. Hx of chronic neuropathy but no worsening of LE numbness or weakness. The patient has no upper back or neck pain, no fever. No history of cancer, IVDU, or recent spinal procedures. No saddle anesthesia. No urinary complaints including no retention or incontinence.   Past Medical History  Diagnosis Date  . Normal echocardiogram 05/30/05    suboptimal study  . Obesity, morbid (more than 100 lbs over ideal weight or BMI > 40) (HCC)     obese since childhood  . Fibromyalgia   . Hyperlipidemia   . Sepsis ([place])   . Depression   . Neuropathy ([place])   . Peripheral edema   . Memory disturbance   . Sleep apnea     "recently dx'd; haven't got my equipment yet" (11/08/2014)  . Headache     "at least weekly; it's usually when I first wake up" (11/08/2014)  . Migraines 1970's - <2000    "they just went away"  . Osteoarthritis   . Arthritis     "I feel like it's everywhere" (11/08/2014)  . Chronic lower back pain   . Pressure ulcer of foot     left  . Hypertension   . Shortness of breath dyspnea   . COPD (chronic obstructive pulmonary  disease) [facility])    Past Surgical History  Procedure Laterality Date  . Total abdominal hysterectomy  1994  . Abdominal hernia repair  2002  . Appendectomy  1995  . Cholecystectomy  1995   Family History  Problem Relation Age of Onset  . Stroke Mother   . Dementia Father   . Congestive Heart Failure Brother   . Diabetes Brother   . Hypertension Brother   . Prostate cancer Father    Social History  Substance Use Topics  . Smoking status: Former Smoker -- 1.00 packs/day for 30 years    Types: Cigarettes    Quit date: 01/05/2009  . Smokeless tobacco: Never Used  . Alcohol Use: No   OB History    Gravida Para Term Preterm AB TAB SAB Ectopic Multiple Living   0 0 0 0 0 0 0 0 0 0      Review of Systems  Constitutional: Negative for fever and chills.  HENT: Negative for congestion.   Eyes: Negative for visual disturbance.  Respiratory: Negative for cough and shortness of breath.   Cardiovascular: Negative.   Gastrointestinal: Negative for nausea, vomiting and abdominal pain.  Genitourinary: Negative for dysuria.  Musculoskeletal: Positive for back pain. Negative for neck pain.  Skin: Negative for rash.  Neurological: Negative for dizziness and weakness.      Allergies  Aspirin  Home Medications   Prior to Admission medications   Medication Sig Start Date End Date Taking? Authorizing Provider  ARIPiprazole (ABILIFY) 2 MG tablet take 1 tablet by mouth every other day 03/08/16  Yes Sela Hua, MD  Ascorbic Acid (VITAMIN C) 1000 MG tablet Take 3,000 mg by mouth at bedtime.    Yes Historical Provider, MD  cetirizine (ZYRTEC) 10 MG tablet Take 10 mg by mouth daily.   Yes Historical Provider, MD  fluticasone furoate-vilanterol (BREO ELLIPTA) 100-25 MCG/INH AEPB Inhale 1 puff into the lungs daily. 02/23/16  Yes Verner Mould, MD  glipiZIDE (GLUCOTROL) 5 MG tablet take 1 tablet by mouth once daily 05/07/16  Yes Sela Hua, MD  LYRICA 150 MG capsule take 1  capsule by mouth twice a day 03/08/16  Yes Sela Hua, MD  nystatin (MYCOSTATIN) powder Apply topically 2 (two) times daily. Apply to affected skin BID Patient taking differently: Apply 1 g topically 2 (two) times daily. Apply to affected skin BID 10/26/15  Yes [name], MD  Omega-3 Fatty Acids (FISH OIL) 1000 MG CAPS Take 2,000 mg by mouth at bedtime.   Yes Historical Provider, MD  oxyCODONE-acetaminophen (PERCOCET) 10-325 MG tablet Take 1 tablet by mouth every 6 (six) hours as needed for pain. 05/01/16  Yes Sela Hua, MD  polyethylene glycol [facility]) packet Take 17 g by mouth at bedtime.    Yes Historical Provider, MD  potassium chloride SA (K-DUR,KLOR-CON) 20 MEQ tablet take 2 tablets by mouth once daily 03/08/16  Yes Sela Hua, MD  pravastatin (PRAVACHOL) 40 MG tablet Take 1 tablet (40 mg total) by mouth at bedtime. 04/02/16  Yes Sela Hua, MD  PSYLLIUM PO Take 2 capsules by mouth at bedtime.   Yes Historical Provider, MD  torsemide (DEMADEX) 20 MG tablet take 2 tablets by mouth twice a day 03/08/16  Yes Sela Hua, MD  traZODone (DESYREL) 150 MG tablet Take 2 tablets (300 mg total) by mouth at bedtime. 04/02/16  Yes Sela Hua, MD  triamterene-hydrochlorothiazide [facility]) 37.5-25 MG tablet take 1 tablet by mouth once daily 03/08/16  Yes Sela Hua, MD  venlafaxine XR (EFFEXOR-XR) 150 MG 24 hr capsule Use 2 tablets daily 05/01/16  Yes Sela Hua, MD  albuterol (PROVENTIL HFA;VENTOLIN HFA) 108 (90 BASE) MCG/ACT inhaler Inhale 2 puffs into the lungs every 6 (six) hours as needed for wheezing or shortness of breath. Patient not taking: Reported on 05/22/2016 01/20/15   [name], MD  ARIPiprazole (ABILIFY) 2 MG tablet take 1 tablet by mouth every other day Patient not taking: Reported on 05/22/2016 03/08/16   Sela Hua, MD  azithromycin (ZITHROMAX) 250 MG tablet Take 1 tablet (250 mg total) by mouth daily. Patient not taking: Reported on  05/22/2016 01/17/16   Asiyah Cletis Media, MD  benzonatate (TESSALON) 100 MG capsule Take 1 capsule (100 mg total) by mouth 3 (three) times daily as needed for cough. Patient not taking: Reported on 05/22/2016 03/21/15   [name], MD  doxycycline (VIBRAMYCIN) 100 MG capsule Take 1 capsule (100 mg total) by mouth 2 (two) times daily. 05/23/16   Ozella Almond Ward, PA-C  hydrocortisone valerate ointment (WESTCORT) 0.2 % Apply 1 application topically 2 (two) times daily. Patient not taking: Reported on 05/22/2016 08/22/15   [name], MD  ibuprofen (ADVIL,MOTRIN) 800 MG tablet Take 1 tablet (800 mg total) by mouth 3 (three) times daily.  05/23/16   [place], PA-C  living well with diabetes book MISC 1 each by Does not apply route once. Patient not taking: Reported on 05/22/2016 01/17/16   Asiyah Cletis Media, MD  metFORMIN (GLUCOPHAGE) 500 MG tablet Take 1 tablet (500 mg total) by mouth 2 (two) times daily with a meal. Patient not taking: Reported on 02/14/2016 01/17/16   Asiyah Cletis Media, MD  oxyCODONE-acetaminophen (PERCOCET) 10-325 MG tablet Take 1 tablet by mouth every 6 (six) hours as needed for pain. Patient not taking: Reported on 05/22/2016 05/01/16   Sela Hua, MD  propranolol (INDERAL) 40 MG tablet Take 1 tablet (40 mg total) by mouth 2 (two) times daily. Patient not taking: Reported on 05/22/2016 02/14/16   Sela Hua, MD  tiotropium [facility]) 18 MCG inhalation capsule Place 1 capsule (18 mcg total) into inhaler and inhale daily. Patient not taking: Reported on 02/14/2016 01/17/16   Asiyah Cletis Media, MD   BP 112/62 mmHg  Pulse 84  Temp(Src) 98.1 F (36.7 C) (Oral)  Resp 20  SpO2 95% Physical Exam  Constitutional: Kathleen Roy is oriented to person, place, and time. Kathleen Roy appears well-developed and well-nourished.  WDWN obese female in NAD.  HENT:  Head: Normocephalic and atraumatic.  Cardiovascular: Normal rate, regular rhythm, normal heart sounds and intact distal pulses.  Exam reveals  no gallop and no friction rub.   No murmur heard. Pulmonary/Chest: Effort normal and breath sounds normal. No respiratory distress.  96% on 4L - on 4L O2 at home. Speaking in full sentences.   Abdominal: Soft. Kathleen Roy exhibits no distension. There is no tenderness.  Musculoskeletal:       Arms: TTP as depicted in image.   Neurological: Kathleen Roy is alert and oriented to person, place, and time. No cranial nerve deficit.  Decreased sensation to bilateral LE which patient states is baseline.  Skin: Skin is warm and dry.  RLE with wound approximately 6 cm: + surrounding erythema which is warm to the touch.   Nursing note and vitals reviewed.   ED Course  Procedures (including critical care time) Labs Review Labs Reviewed  BASIC METABOLIC PANEL - Abnormal; Notable for the following:    Potassium 3.1 (*)    Chloride 90 (*)    CO2 45 (*)    Glucose, Bld 134 (*)    BUN 27 (*)    All other components within normal limits  CBC WITH DIFFERENTIAL/PLATELET - Abnormal; Notable for the following:    RDW 16.5 (*)    All other components within normal limits    Imaging Review Dg Lumbar Spine Complete  05/23/2016  CLINICAL DATA:  55 year old female with back pain.  No known injury. EXAM: LUMBAR SPINE - COMPLETE 4+ VIEW COMPARISON:  CT of the abdomen pelvis dated 01/08/2010 FINDINGS: Evaluation is very limited due to patient's body habitus and beam attenuation by soft tissue. No definite acute fracture or subluxation identified. There is multilevel degenerative changes of the spine. Abdomen right upper quadrant cholecystectomy clips. Moderate stool throughout the colon. IMPRESSION: No definite acute fracture subluxation. Electronically Signed   By: Anner Crete M.D.   On: 05/23/2016 02:18   I have personally reviewed and evaluated these images and lab results as part of my medical decision-making.   EKG Interpretation None      MDM   Final diagnoses:  Right low back pain, with sciatica presence  unspecified  Hypokalemia  Cellulitis of right lower extremity   AMAYAH CRIVELLI is an  obese female with multiple chronic medical conditions who presents to ED for back pain. Patient demonstrates no lower extremity weakness, saddle anesthesia, bowel or bladder incontinence, or neuro deficits. No concern for cauda equina. No fevers or other infectious symptoms to suggest that the patient's back pain is due to an infection. Decreased sensation at baseline. Ambulating at baseline. On exam, patient has skin wound of RLE with surrounding erythema and warmth. States Kathleen Roy has continued to pick at this area and it has been worsening over the last week. Appears cellulitic.   Labs: cbc with normal white count; BMP with K+ of 3.1 (replenished in ED) - other values reviewed and baseline for patient.  Imaging: L-spine x-rays with no acute injury, multilevel degenerative changes appreciated  Plan: Back pain - Ibuprofen, continue home oxycodone PRN, encouraged exercise/weight loss, keep PCP appointment on 6/20.  Cellulitis - Doxycycline  I have reviewed return precautions and the patient has voiced understanding. All questions answered.   Patient discussed with Dr. Dina Rich who agrees with treatment plan.   [place] Ward, PA-C 05/23/16 [id number]  [name], MD 05/23/16 0600

## 2016-06-04 ENCOUNTER — Ambulatory Visit (INDEPENDENT_AMBULATORY_CARE_PROVIDER_SITE_OTHER): Payer: Medicare Other | Admitting: Internal Medicine

## 2016-06-04 VITALS — BP 138/62 | HR 87 | Temp 98.5°F | Wt 345.0 lb

## 2016-06-04 DIAGNOSIS — L03115 Cellulitis of right lower limb: Secondary | ICD-10-CM

## 2016-06-04 DIAGNOSIS — G8929 Other chronic pain: Secondary | ICD-10-CM | POA: Diagnosis not present

## 2016-06-04 DIAGNOSIS — M5440 Lumbago with sciatica, unspecified side: Secondary | ICD-10-CM | POA: Diagnosis not present

## 2016-06-04 DIAGNOSIS — E118 Type 2 diabetes mellitus with unspecified complications: Secondary | ICD-10-CM

## 2016-06-04 DIAGNOSIS — G894 Chronic pain syndrome: Secondary | ICD-10-CM | POA: Diagnosis not present

## 2016-06-04 LAB — POCT GLYCOSYLATED HEMOGLOBIN (HGB A1C): HEMOGLOBIN A1C: 6.3

## 2016-06-04 MED ORDER — POTASSIUM CHLORIDE CRYS ER 20 MEQ PO TBCR: 40.0000 meq | EXTENDED_RELEASE_TABLET | Freq: Every day | ORAL | Status: DC

## 2016-06-04 MED ORDER — IBUPROFEN 800 MG PO TABS: 800.0000 mg | ORAL_TABLET | Freq: Three times a day (TID) | ORAL | Status: DC

## 2016-06-04 MED ORDER — FISH OIL 1000 MG PO CAPS: 2000.0000 mg | ORAL_CAPSULE | Freq: Every day | ORAL | Status: DC

## 2016-06-04 MED ORDER — TRIAMTERENE-HCTZ 37.5-25 MG PO TABS
1.0000 | ORAL_TABLET | Freq: Every day | ORAL | Status: DC
Start: 2016-06-04 — End: 2016-08-22

## 2016-06-04 MED ORDER — OXYCODONE-ACETAMINOPHEN 10-325 MG PO TABS: 1.0000 | ORAL_TABLET | Freq: Four times a day (QID) | ORAL | Status: DC | PRN

## 2016-06-04 MED ORDER — DOXYCYCLINE HYCLATE 100 MG PO CAPS: 100.0000 mg | ORAL_CAPSULE | Freq: Two times a day (BID) | ORAL | Status: DC

## 2016-06-04 NOTE — Assessment & Plan Note (Signed)
Likely related to her morbid obesity. Component of sciatica.  - Continue Lyrica 150mg  daily, Percocet 10-325mg  q6hrs prn - Advised Pt that continued weight loss will help her pain. - Refer to pain clinic

## 2016-06-04 NOTE — Assessment & Plan Note (Addendum)
Well-controlled. Last A1c 8.0. A1c today is 6.3%. Pt has lost 15 lbs since 08/2015. Blood sugars ranging from 90-140. No lows. - Advised Pt to check sugars once a day and again if she feels like her blood sugar is low - Continue Glipizide 5mg  daily and Metformin 500mg  bid. - Follow-up in 3 months

## 2016-06-04 NOTE — Patient Instructions (Signed)
It was so nice to see you!  You have done an amazing job with eating healthier! Keep up the great work. You can decrease your blood sugar checks to once a day. Please check your blood sugar whenever you feel like it might be low.  Please take the Doxycycline twice a day for 14 days. You should also buy some ACE bandage wraps to wrap around your legs to compress them. This will help the ulcers heal.  I have sent in a referral to pain clinic. You should hear from our office in the next few weeks to schedule this appointment.  -Dr. Brett Albino

## 2016-06-04 NOTE — Assessment & Plan Note (Signed)
With component of venous stasis dermatitis and weeping ulcers. Recently treated with Doxycycline, which helped. On exam, RLE is erythematous and warm to the touch. - Will give another course of Doxycycline 100mg  bid x 14 days - Pt states she is unable to elevate her legs at home - Discussed buying ACE bandages and wrapping her lower extremities to provide compression. Pt cannot afford compression stockings and cannot come to clinic every week, so unna boots would be pointless. - Right now, the weeping ulcers are pretty mild. If they worsen, may need to consider placing unna boot and ordering home health nurse for unna boot changes.

## 2016-06-04 NOTE — Progress Notes (Signed)
[facility] Phone: [phone]  Subjective:  Right sided leg pain: She is having pain that starts in the right side of her back and travels down the back of her leg to her knee. The pain has been going on for a few months. The pain is sharp. The pain is worse with standing up. Her pain medications aren't helping.  Weeping ulcers legs: Has had weeping ulcers on her lower extremities. Has been going on for many months now. Initially just having ulcers on her right leg, but now starting to develop on her left leg. She was told in the past to wear compression stockings, but she cannot afford to get measured for these. She sits all day on the couch and is unable to elevate her lower extremities. There is not enough room in their house for her to put her feet up. She was just recently seen in the ED and prescribed Doxycycline for the redness of her right lower extremity. She felt like the Doxycycline helped the redness.  Diabetes: She has been eating smaller meals and reducing her carb intake. She checks her blood sugars twice a day. They have been 90-140. No lows. She has lost 15 lbs. No sweatiness, no shakiness.   ROS: See HPI for pertinent positives and negatives Past Medical History- HTN, HFpEF, COPD, T2DM, chronic pain, chronic lower extremity edema, HLD Reviewed problem list.  Medications- reviewed and updated Current Outpatient Prescriptions  Medication Sig Dispense Refill  . albuterol (PROVENTIL HFA;VENTOLIN HFA) 108 (90 BASE) MCG/ACT inhaler Inhale 2 puffs into the lungs every 6 (six) hours as needed for wheezing or shortness of breath. (Patient not taking: Reported on 05/22/2016) 18 g 3  . ARIPiprazole (ABILIFY) 2 MG tablet take 1 tablet by mouth every other day (Patient not taking: Reported on 05/22/2016) 45 tablet 1  . ARIPiprazole (ABILIFY) 2 MG tablet take 1 tablet by mouth every other day 45 tablet 0  . Ascorbic Acid (VITAMIN C) 1000 MG tablet Take 3,000 mg by  mouth at bedtime.     Marland Kitchen azithromycin (ZITHROMAX) 250 MG tablet Take 1 tablet (250 mg total) by mouth daily. (Patient not taking: Reported on 05/22/2016) 4 tablet 0  . benzonatate (TESSALON) 100 MG capsule Take 1 capsule (100 mg total) by mouth 3 (three) times daily as needed for cough. (Patient not taking: Reported on 05/22/2016) 20 capsule 0  . cetirizine (ZYRTEC) 10 MG tablet Take 10 mg by mouth daily.    Marland Kitchen doxycycline (VIBRAMYCIN) 100 MG capsule Take 1 capsule (100 mg total) by mouth 2 (two) times daily. 20 capsule 0  . fluticasone furoate-vilanterol (BREO ELLIPTA) 100-25 MCG/INH AEPB Inhale 1 puff into the lungs daily. 1 each 1  . glipiZIDE (GLUCOTROL) 5 MG tablet take 1 tablet by mouth once daily 30 tablet 1  . hydrocortisone valerate ointment (WESTCORT) 0.2 % Apply 1 application topically 2 (two) times daily. (Patient not taking: Reported on 05/22/2016) 45 g 0  . ibuprofen (ADVIL,MOTRIN) 800 MG tablet Take 1 tablet (800 mg total) by mouth 3 (three) times daily. 21 tablet 0  . living well with diabetes book MISC 1 each by Does not apply route once. (Patient not taking: Reported on 05/22/2016) 1 each 0  . LYRICA 150 MG capsule take 1 capsule by mouth twice a day 180 capsule 1  . metFORMIN (GLUCOPHAGE) 500 MG tablet Take 1 tablet (500 mg total) by mouth 2 (two) times daily with a meal. (Patient not taking: Reported on  02/14/2016) 60 tablet 1  . nystatin (MYCOSTATIN) powder Apply topically 2 (two) times daily. Apply to affected skin BID (Patient taking differently: Apply 1 g topically 2 (two) times daily. Apply to affected skin BID) 360 g 6  . Omega-3 Fatty Acids (FISH OIL) 1000 MG CAPS Take 2,000 mg by mouth at bedtime.    Marland Kitchen oxyCODONE-acetaminophen (PERCOCET) 10-325 MG tablet Take 1 tablet by mouth every 6 (six) hours as needed for pain. (Patient not taking: Reported on 05/22/2016) 120 tablet 0  . oxyCODONE-acetaminophen (PERCOCET) 10-325 MG tablet Take 1 tablet by mouth every 6 (six) hours as needed for pain.  120 tablet 0  . polyethylene glycol (MIRALAX / GLYCOLAX) packet Take 17 g by mouth at bedtime.     . potassium chloride SA (K-DUR,KLOR-CON) 20 MEQ tablet take 2 tablets by mouth once daily 180 tablet 0  . pravastatin (PRAVACHOL) 40 MG tablet Take 1 tablet (40 mg total) by mouth at bedtime. 90 tablet 1  . propranolol (INDERAL) 40 MG tablet Take 1 tablet (40 mg total) by mouth 2 (two) times daily. (Patient not taking: Reported on 05/22/2016) 60 tablet 0  . PSYLLIUM PO Take 2 capsules by mouth at bedtime.    Marland Kitchen tiotropium (SPIRIVA) 18 MCG inhalation capsule Place 1 capsule (18 mcg total) into inhaler and inhale daily. (Patient not taking: Reported on 02/14/2016) 30 capsule 12  . torsemide (DEMADEX) 20 MG tablet take 2 tablets by mouth twice a day 360 tablet 1  . traZODone (DESYREL) 150 MG tablet Take 2 tablets (300 mg total) by mouth at bedtime. 180 tablet 1  . triamterene-hydrochlorothiazide (MAXZIDE-25) 37.5-25 MG tablet take 1 tablet by mouth once daily 90 tablet 0  . venlafaxine XR (EFFEXOR-XR) 150 MG 24 hr capsule Use 2 tablets daily 60 capsule 1   No current facility-administered medications for this visit.   Chief complaint-noted Family history reviewed for today's visit. No changes. Social history- patient is a former smoker. Quit in 2010.  Objective: There were no vitals taken for this visit. Gen: NAD, alert, cooperative with exam HEENT: NCAT, EOMI, MMM Neck: FROM, supple CV: RRR, no murmur Resp: CTABL, no wheezes, normal work of breathing, [place] in place GI: SNTND, BS present, no guarding or organomegaly Msk: Lower extremities are markedly edematous bilaterally. Foot exam: Feet edematous, no ulcerations, 1+ DP pulses bilaterally, decreased sensation throughout entire feet bilaterally Neuro: Alert and oriented, no gross deficits Skin: Erythema and warmth present in right lower extremity, few small weeping ulcers bilaterally. Psych: Appropriate behavior  Assessment/Plan: Chronic Pain:    Likely related to her morbid obesity. Component of sciatica.  - Continue Lyrica 150mg  daily, Percocet 10-325mg  q6hrs prn - Advised Pt that continued weight loss will help her pain. - Refer to pain clinic  Right Lower Extremity Cellulitis: With component of venous stasis dermatitis and weeping ulcers. Recently treated with Doxycycline, which helped. On exam, RLE is erythematous and warm to the touch. - Will give another course of Doxycycline 100mg  bid x 14 days - Pt states she is unable to elevate her legs at home - Discussed buying ACE bandages and wrapping her lower extremities to provide compression. Pt cannot afford compression stockings and cannot come to clinic every week, so unna boots would be pointless. - Right now, the weeping ulcers are pretty mild. If they worsen, may need to consider placing unna boot and ordering home health nurse for unna boot changes.  Diabetes:  Well-controlled. Last A1c 8.0. A1c today is 6.3%. Pt  has lost 15 lbs since 08/2015. Blood sugars ranging from 90-140. No lows. - Advised Pt to check sugars once a day and again if she feels like her blood sugar is low - Continue Glipizide 5mg  daily and Metformin 500mg  bid.  Hyman Bible, MD PGY-1

## 2016-06-06 ENCOUNTER — Telehealth: Payer: Self-pay | Admitting: Internal Medicine

## 2016-06-06 NOTE — Telephone Encounter (Signed)
Will forward to MD to advise. [name],CMA  

## 2016-06-06 NOTE — Telephone Encounter (Signed)
Patient states pharmacy did not receive Fish Oil RX. Please re-send.

## 2016-06-10 MED ORDER — FISH OIL 1000 MG PO CAPS: 2000.0000 mg | ORAL_CAPSULE | Freq: Every day | ORAL | Status: AC

## 2016-06-10 NOTE — Telephone Encounter (Signed)
Fish oil refilled. Thank you!

## 2016-06-10 NOTE — Telephone Encounter (Signed)
Fish oil refilled.

## 2016-06-12 ENCOUNTER — Encounter (HOSPITAL_COMMUNITY): Payer: Self-pay | Admitting: Adult Health

## 2016-06-12 ENCOUNTER — Emergency Department (HOSPITAL_COMMUNITY): Payer: Medicare Other

## 2016-06-12 ENCOUNTER — Emergency Department (HOSPITAL_COMMUNITY)
Admission: EM | Admit: 2016-06-12 | Discharge: 2016-06-12 | Disposition: A | Discharge status: Home / Self Care | Payer: Medicare Other | Attending: Emergency Medicine | Admitting: Emergency Medicine

## 2016-06-12 ENCOUNTER — Telehealth: Payer: Self-pay | Admitting: Internal Medicine

## 2016-06-12 DIAGNOSIS — R030 Elevated blood-pressure reading, without diagnosis of hypertension: Secondary | ICD-10-CM | POA: Diagnosis not present

## 2016-06-12 DIAGNOSIS — N838 Other noninflammatory disorders of ovary, fallopian tube and broad ligament: Secondary | ICD-10-CM

## 2016-06-12 DIAGNOSIS — M5410 Radiculopathy, site unspecified: Secondary | ICD-10-CM

## 2016-06-12 DIAGNOSIS — M5431 Sciatica, right side: Secondary | ICD-10-CM

## 2016-06-12 DIAGNOSIS — J449 Chronic obstructive pulmonary disease, unspecified: Secondary | ICD-10-CM | POA: Diagnosis not present

## 2016-06-12 DIAGNOSIS — M5441 Lumbago with sciatica, right side: Secondary | ICD-10-CM | POA: Insufficient documentation

## 2016-06-12 DIAGNOSIS — M79605 Pain in left leg: Secondary | ICD-10-CM | POA: Diagnosis not present

## 2016-06-12 DIAGNOSIS — Z7984 Long term (current) use of oral hypoglycemic drugs: Secondary | ICD-10-CM | POA: Insufficient documentation

## 2016-06-12 DIAGNOSIS — I10 Essential (primary) hypertension: Secondary | ICD-10-CM | POA: Diagnosis not present

## 2016-06-12 DIAGNOSIS — Z87891 Personal history of nicotine dependence: Secondary | ICD-10-CM | POA: Diagnosis not present

## 2016-06-12 DIAGNOSIS — M545 Low back pain: Secondary | ICD-10-CM | POA: Diagnosis not present

## 2016-06-12 MED ORDER — OXYCODONE-ACETAMINOPHEN 5-325 MG PO TABS
1.0000 | ORAL_TABLET | Freq: Four times a day (QID) | ORAL | Status: DC | PRN
Start: 2016-06-12 — End: 2016-07-04

## 2016-06-12 MED ORDER — CYCLOBENZAPRINE HCL 10 MG PO TABS: 10.0000 mg | ORAL_TABLET | Freq: Three times a day (TID) | ORAL | Status: DC | PRN

## 2016-06-12 MED ORDER — CYCLOBENZAPRINE HCL 10 MG PO TABS
10.0000 mg | ORAL_TABLET | Freq: Once | ORAL | Status: AC
  Administered 2016-06-12: 10 mg via ORAL
  Filled 2016-06-12: qty 1

## 2016-06-12 MED ORDER — HYDROMORPHONE HCL 1 MG/ML IJ SOLN
1.0000 mg | Freq: Once | INTRAMUSCULAR | Status: AC
  Administered 2016-06-12: 1 mg via INTRAMUSCULAR
  Filled 2016-06-12: qty 1

## 2016-06-12 NOTE — Telephone Encounter (Signed)
Will route to MD

## 2016-06-12 NOTE — Telephone Encounter (Signed)
Returned call about Pt's MRI results that were performed in the ED. Pt had a question about a mass seen on her ovary, because she didn't think she had any ovaries. On chart review, Pt had a CT abdomen in 2011 that showed hysterectomy with right ovary left.  -Will order pelvic ultrasound. -May need referral to Ob/Gyn  Hyman Bible, MD PGY-1

## 2016-06-12 NOTE — ED Notes (Signed)
Patient transported to MRI 

## 2016-06-12 NOTE — ED Notes (Signed)
Pt c/o R sided back pain and leg pain x 3 weeks. Reports being seen at [facility] and referred to a pain management clinic, has been unable to follow up at a pain management clinic.

## 2016-06-12 NOTE — ED Provider Notes (Signed)
Sign out from [employer], PA-C  Sciatic pain x 3weeks, lower back pain with radiation to R lower extremity worsening; normal neuro exam, no incontinence; MRI of lumbar spine pending; discharge if nothing emergent with follow up to PCP and neurosurgery  MRI shows: 1. Severe bilateral facet arthropathy at L4-5 and L5-S1. No evidence of nerve root impingement. 2. 8.4 cm T2 hyperintense partially visualized right adnexal mass of uncertain etiology, but may arise from the right ovary. If there is further clinical concern, recommend a dedicated pelvic ultrasound.  I will refer patient to neurosurgery for further evaluation as planned. Patient also referred to pain clinic by her PCP. I also refer patient to OB/GYN as soon as possible for further evaluation of adnexal mass found incidentally on MRI. Patient understands and agrees with plan. Patient vitals stable throughout ED course and discharged in satisfactory condition. Questions answered. I discussed patient with Dr. Tomi Bamberger who is in agreement with plan.  Frederica Kuster, PA-C 06/12/16 1137  Dorie Rank, MD 06/12/16 1140

## 2016-06-12 NOTE — Telephone Encounter (Signed)
I have ordered a pelvic ultrasound at [facility] for Kathleen Roy. Can you please schedule this and let her know when her [place] is? Thank you so much!

## 2016-06-12 NOTE — Discharge Instructions (Signed)
Return here as needed.  Follow-up with the neurosurgeon provided. follow-up with your primary care doctor. Also follow-up with your primary care doctor and an OB/GYN for further evaluation of the mass that was found in your right pelvis. You can call the OB/GYN clinic outlined on your discharge paperwork or another one recommended by her primary care doctor. The radiologist recommends a pelvic ultrasound: 8.4 cm T2 hyperintense partially visualized right adnexal mass of uncertain etiology, but may arise from the right ovary. If there is further clinical concern, recommend a dedicated pelvic ultrasound.

## 2016-06-12 NOTE — ED Provider Notes (Signed)
CSN: [id number]     Arrival date & time 06/12/16  0300 History   First MD Initiated Contact with Patient 06/12/16 [phone]     Chief Complaint  Patient presents with  . Leg Pain     (Consider location/radiation/quality/duration/timing/severity/associated sxs/prior Treatment) HPI Patient presents to the emergency department with lower back pain that radiates into the right lower extremity.  He states she was seen in [place] 3 weeks ago with similar symptoms, but they seem to be worse tonight.  The patient states that nothing seems make the condition better, movement and palpation make the pain worse.  She also states certain positions make her pain worse.  Patient states that she has not had any incontinence of stool or urine. The patient denies chest pain, shortness of breath, headache,blurred vision, neck pain, fever, cough, weakness, numbness, dizziness, anorexia, edema, abdominal pain, nausea, vomiting, diarrhea, rash, dysuria, hematemesis, bloody stool, near syncope, or syncope. Past Medical History  Diagnosis Date  . Normal echocardiogram 05/30/05    suboptimal study  . Obesity, morbid (more than 100 lbs over ideal weight or BMI > 40) (HCC)     obese since childhood  . Fibromyalgia   . Hyperlipidemia   . Sepsis ([place])   . Depression   . Neuropathy ([place])   . Peripheral edema   . Memory disturbance   . Sleep apnea     "recently dx'd; haven't got my equipment yet" (11/08/2014)  . Headache     "at least weekly; it's usually when I first wake up" (11/08/2014)  . Migraines 1970's - <2000    "they just went away"  . Osteoarthritis   . Arthritis     "I feel like it's everywhere" (11/08/2014)  . Chronic lower back pain   . Pressure ulcer of foot     left  . Hypertension   . Shortness of breath dyspnea   . COPD (chronic obstructive pulmonary disease) [facility])    Past Surgical History  Procedure Laterality Date  . Total abdominal hysterectomy  1994  . Abdominal hernia repair  2002    . Appendectomy  1995  . Cholecystectomy  1995   Family History  Problem Relation Age of Onset  . Stroke Mother   . Dementia Father   . Congestive Heart Failure Brother   . Diabetes Brother   . Hypertension Brother   . Prostate cancer Father    Social History  Substance Use Topics  . Smoking status: Former Smoker -- 1.00 packs/day for 30 years    Types: Cigarettes    Quit date: 01/05/2009  . Smokeless tobacco: Never Used  . Alcohol Use: No   OB History    Gravida Para Term Preterm AB TAB SAB Ectopic Multiple Living   0 0 0 0 0 0 0 0 0 0      Review of Systems All other systems negative except as documented in the HPI. All pertinent positives and negatives as reviewed in the HPI.   Allergies  Aspirin  Home Medications   Prior to Admission medications   Medication Sig Start Date End Date Taking? Authorizing Provider  ARIPiprazole (ABILIFY) 2 MG tablet take 1 tablet by mouth every other day 03/08/16  Yes Sela Hua, MD  Ascorbic Acid (VITAMIN C) 1000 MG tablet Take 3,000 mg by mouth at bedtime.    Yes Historical Provider, MD  cetirizine (ZYRTEC) 10 MG tablet Take 10 mg by mouth daily.   Yes Historical Provider, MD  doxycycline (VIBRAMYCIN) 100  MG capsule Take 1 capsule (100 mg total) by mouth 2 (two) times daily. 06/04/16  Yes Sela Hua, MD  glipiZIDE (GLUCOTROL) 5 MG tablet take 1 tablet by mouth once daily 05/07/16  Yes Sela Hua, MD  ibuprofen (ADVIL,MOTRIN) 800 MG tablet Take 1 tablet (800 mg total) by mouth 3 (three) times daily. 06/04/16  Yes Sela Hua, MD  LYRICA 150 MG capsule take 1 capsule by mouth twice a day 03/08/16  Yes Sela Hua, MD  nystatin (MYCOSTATIN) powder Apply topically 2 (two) times daily. Apply to affected skin BID Patient taking differently: Apply 1 g topically 2 (two) times daily as needed (yeast). Apply to affected skin BID 10/26/15  Yes [name], MD  Omega-3 Fatty Acids (FISH OIL) 1000 MG CAPS Take 2 capsules (2,000 mg  total) by mouth at bedtime. 06/10/16  Yes Sela Hua, MD  oxyCODONE-acetaminophen (PERCOCET) 10-325 MG tablet Take 1 tablet by mouth every 6 (six) hours as needed for pain. 06/04/16  Yes Sela Hua, MD  polyethylene glycol [facility] / Floria Raveling) packet Take 17 g by mouth at bedtime.    Yes Historical Provider, MD  potassium chloride SA (K-DUR,KLOR-CON) 20 MEQ tablet Take 2 tablets (40 mEq total) by mouth daily. 06/04/16  Yes Sela Hua, MD  pravastatin (PRAVACHOL) 40 MG tablet Take 1 tablet (40 mg total) by mouth at bedtime. 04/02/16  Yes Sela Hua, MD  torsemide (DEMADEX) 20 MG tablet take 2 tablets by mouth twice a day Patient taking differently: Take 4 tablets in the afternoon. 03/08/16  Yes Sela Hua, MD  traZODone (DESYREL) 150 MG tablet Take 2 tablets (300 mg total) by mouth at bedtime. 04/02/16  Yes Sela Hua, MD  triamterene-hydrochlorothiazide (MAXZIDE-25) 37.5-25 MG tablet Take 1 tablet by mouth daily. 06/04/16  Yes Sela Hua, MD  venlafaxine XR (EFFEXOR-XR) 150 MG 24 hr capsule Use 2 tablets daily 05/01/16  Yes Sela Hua, MD   BP 136/63 mmHg  Pulse 90  Temp(Src) 97.8 F (36.6 C) (Oral)  Resp 20  Ht 5\' 2"  (1.575 m)  Wt 159.666 kg  BMI 64.37 kg/m2  SpO2 96% Physical Exam  Constitutional: She is oriented to person, place, and time. She appears well-developed and well-nourished. No distress.  HENT:  Head: Normocephalic and atraumatic.  Mouth/Throat: Oropharynx is clear and moist.  Eyes: Pupils are equal, round, and reactive to light.  Neck: Normal range of motion. Neck supple.  Cardiovascular: Normal rate, regular rhythm and normal heart sounds.  Exam reveals no gallop and no friction rub.   No murmur heard. Pulmonary/Chest: Effort normal and breath sounds normal. No respiratory distress. She has no wheezes.  Musculoskeletal:       Lumbar back: She exhibits tenderness and pain. She exhibits no bony tenderness and no spasm.       Back:  Neurological:  She is alert and oriented to person, place, and time. She exhibits normal muscle tone. Coordination normal.  Skin: Skin is warm and dry. No rash noted. No erythema.  Psychiatric: She has a normal mood and affect. Her behavior is normal.  Nursing note and vitals reviewed.   ED Course  Procedures (including critical care time) Labs Review Labs Reviewed - No data to display  Imaging Review No results found. I have personally reviewed and evaluated these images and lab results as part of my medical decision-making.  The patient's pain is consistent with sciatica.  She does not have  any neurological deficits noted on exam.  Patient will be referred to neurosurgery for further evaluation and care.  Patient agrees the plan and all questions were answered.  I do believe that this condition is multifactorial for the patient  MDM   Final diagnoses:  None      Dalia Heading, PA-C 06/12/16 [id number]  Everlene Balls, MD 06/12/16 1453

## 2016-06-12 NOTE — Telephone Encounter (Signed)
Pt is requesting to speak to Dr. Brett Albino ASAP. She was at the ED last night and they have found a mass on her ovary. The problem is that she said she doesn't have a ovary since they did a hysterectomy a few years ago. jw

## 2016-06-12 NOTE — ED Notes (Signed)
HX of chronic pain and sciatica-for 3 weeks having right leg pain, this evening pain became worse. Pain is in right leg and goes into buttock and back also reports right knee pain and swelling. Oxycodone not helping with pain.

## 2016-06-13 NOTE — Addendum Note (Signed)
Addended by: Hyman Bible D on: 06/13/2016 08:42 AM   Modules accepted: Orders

## 2016-06-13 NOTE — Telephone Encounter (Signed)
appt made for 06-20-16. [name],CMA

## 2016-06-17 ENCOUNTER — Other Ambulatory Visit: Payer: Self-pay | Admitting: *Deleted

## 2016-06-17 MED ORDER — CYCLOBENZAPRINE HCL 10 MG PO TABS: 10.0000 mg | ORAL_TABLET | Freq: Three times a day (TID) | ORAL | Status: DC | PRN

## 2016-06-17 NOTE — Telephone Encounter (Signed)
Pt needs a refill and she said more than 15. Deseree Kennon Holter, CMA

## 2016-06-17 NOTE — Telephone Encounter (Signed)
Medication refilled

## 2016-06-20 ENCOUNTER — Ambulatory Visit (HOSPITAL_COMMUNITY): Payer: Medicare Other

## 2016-06-28 ENCOUNTER — Other Ambulatory Visit: Payer: Self-pay | Admitting: Internal Medicine

## 2016-06-28 NOTE — Telephone Encounter (Signed)
Medication refilled

## 2016-07-01 ENCOUNTER — Ambulatory Visit (HOSPITAL_COMMUNITY): Payer: Medicare Other

## 2016-07-04 ENCOUNTER — Other Ambulatory Visit: Payer: Self-pay | Admitting: Internal Medicine

## 2016-07-04 NOTE — Telephone Encounter (Signed)
Pt called and needs a refill on her Oxycodone left up front. Please call when this is done so that she can have her brother come and pick this up, jw

## 2016-07-05 ENCOUNTER — Other Ambulatory Visit: Payer: Self-pay | Admitting: Internal Medicine

## 2016-07-05 MED ORDER — OXYCODONE-ACETAMINOPHEN 5-325 MG PO TABS: 1.0000 | ORAL_TABLET | Freq: Four times a day (QID) | ORAL | Status: DC | PRN

## 2016-07-05 NOTE — Telephone Encounter (Signed)
Pt informed of rx refill placed up front.

## 2016-07-05 NOTE — Telephone Encounter (Signed)
Please let Ms. Delich know that I have refilled her Percocet. Her brother can pick it up at our front office. Thank you!

## 2016-07-10 ENCOUNTER — Ambulatory Visit (HOSPITAL_COMMUNITY)
Admission: RE | Admit: 2016-07-10 | Discharge: 2016-07-10 | Disposition: A | Discharge status: Home / Self Care | Payer: Medicare Other | Source: Ambulatory Visit | Attending: Family Medicine | Admitting: Family Medicine

## 2016-07-10 DIAGNOSIS — Z90710 Acquired absence of both cervix and uterus: Secondary | ICD-10-CM | POA: Diagnosis not present

## 2016-07-10 DIAGNOSIS — N839 Noninflammatory disorder of ovary, fallopian tube and broad ligament, unspecified: Secondary | ICD-10-CM | POA: Insufficient documentation

## 2016-07-10 DIAGNOSIS — N838 Other noninflammatory disorders of ovary, fallopian tube and broad ligament: Secondary | ICD-10-CM

## 2016-07-12 ENCOUNTER — Telehealth: Payer: Self-pay | Admitting: *Deleted

## 2016-07-12 NOTE — Telephone Encounter (Signed)
Patient called to check on [place] results. Garmon Dehn,CMA

## 2016-07-15 ENCOUNTER — Other Ambulatory Visit: Payer: Self-pay | Admitting: Internal Medicine

## 2016-07-15 MED ORDER — CLINDAMYCIN HCL 300 MG PO CAPS: 300.0000 mg | ORAL_CAPSULE | Freq: Four times a day (QID) | ORAL | 0 refills | Status: DC

## 2016-07-15 NOTE — Telephone Encounter (Signed)
Called patient back and discussed [place] results, which did not show any ovarian masses.   Pt also mentioned to me that her lower extremity cellulitis has returned and is worsening after finishing the course of Doxycycline. Pt endorses chills (chronic problem for her) and denies fevers, nausea, vomiting. I strongly encouraged Pt to keep legs elevated as much as possible throughout the day and to use ACE bandages to keep her legs compressed. Will prescribe Clindamycin 300mg  four times a day x 7 days. Pt advised to call if the cellulitis does not improve. Will likely need to be seen in clinic at that time, although I know it is difficult for her to get to clinic.  Hyman Bible, MD PGY-2

## 2016-07-25 ENCOUNTER — Other Ambulatory Visit: Payer: Self-pay | Admitting: Internal Medicine

## 2016-07-25 ENCOUNTER — Telehealth: Payer: Self-pay | Admitting: *Deleted

## 2016-07-25 DIAGNOSIS — L8992 Pressure ulcer of unspecified site, stage 2: Secondary | ICD-10-CM

## 2016-07-25 DIAGNOSIS — I83009 Varicose veins of unspecified lower extremity with ulcer of unspecified site: Secondary | ICD-10-CM

## 2016-07-25 DIAGNOSIS — L97909 Non-pressure chronic ulcer of unspecified part of unspecified lower leg with unspecified severity: Principal | ICD-10-CM

## 2016-07-25 MED ORDER — CLINDAMYCIN HCL 300 MG PO CAPS: 300.0000 mg | ORAL_CAPSULE | Freq: Four times a day (QID) | ORAL | 0 refills | Status: DC

## 2016-07-25 NOTE — Progress Notes (Signed)
Spoke with Pt on the phone. She feels like the Clindamycin has been helping the redness in her leg. The redness is ~50% better. Will prescribe another 7 day course of Clindamycin and then will see what the redness does.   Pt also stating that her pressure ulcers on the backs of her thighs have continued to worsen and are extremely painful. She has been trying Salon Plus lotion, but it has not helped. She cannot leave her home due to immobility issues and cannot be seen regularly in our clinic for close follow-up of her wounds. I would also consider sending her to wound clinic, but it would be equally as difficult for her to be seen regularly there. At this point, I will order home health nursing for wound care.  Hyman Bible, MD PGY-2

## 2016-07-25 NOTE — Telephone Encounter (Signed)
Pt calling stating you had her on clindamycin and she is running out. She wants to know if you want her to stop it or are you going to send in another refill. Please advise and call pt back. Deseree Kennon Holter, CMA

## 2016-07-29 ENCOUNTER — Other Ambulatory Visit: Payer: Self-pay | Admitting: Internal Medicine

## 2016-07-29 MED ORDER — OXYCODONE-ACETAMINOPHEN 10-325 MG PO TABS
1.0000 | ORAL_TABLET | Freq: Three times a day (TID) | ORAL | 0 refills | Status: DC | PRN
Start: 2016-07-29 — End: 2016-08-16

## 2016-07-29 NOTE — Telephone Encounter (Signed)
Will forward to PCP.  Angelissa Supan L, RN  

## 2016-07-29 NOTE — Progress Notes (Signed)
Medication refilled. Will have Pt bring the Percocet 5-325mg  to clinic so that we can discard them when she picks up the new prescription.

## 2016-07-29 NOTE — Telephone Encounter (Signed)
Please let Kathleen Roy know that I have left a new prescription for Percocet 10-325mg  for her at the front desk. If she already picked up the Percocet 5-325mg  from the pharmacy, she will need to bring the pills to clinic so we can discard them. If she has not filled the prescription, please have her bring the prescription paper to clinic so we can discard it. Thank you!

## 2016-07-29 NOTE — Telephone Encounter (Signed)
Pt received a Rx for Oxycodone 5mg  when the pt stated she normally takes 10mg . Pt also stated taking 2 5mg  does not work as well as taking a 10mg . Pt would like a Rx for oxycodone 10mg . Please advise. Thanks! ep

## 2016-07-30 ENCOUNTER — Telehealth: Payer: Self-pay | Admitting: Internal Medicine

## 2016-07-30 ENCOUNTER — Other Ambulatory Visit: Payer: Self-pay | Admitting: Internal Medicine

## 2016-07-30 DIAGNOSIS — R1310 Dysphagia, unspecified: Secondary | ICD-10-CM

## 2016-07-30 NOTE — Progress Notes (Signed)
Pt called stating she has been having pain in her chest with swallowing and difficulty swallowing. We made her an appointment to be seen in our clinic to discuss this further, but Pt states she would prefer a referral to GI because she has a hard time getting out of the house and does not want to come to our clinic and then have to see GI anyway. Given Pt preference and because she does have difficulty getting to our clinic, will refer to GI.  Hyman Bible, MD PGY-2

## 2016-07-30 NOTE — Telephone Encounter (Signed)
Spoke with pt and she is going to bring her 5mg  oxycodone to clinic to be discarded when she comes to pick up her new RX. Pt also mentioned that she has been having some pains in her chest when she eats or drinks anything...i made an apt for her, 9/5, to discuss but pt would rather not come into clinic. She would like for you to just refer her to a gastro MD. Please advise.

## 2016-07-30 NOTE — Telephone Encounter (Signed)
Pt stated Page made her an appointment for September, but the pt does not want to wait that long. Pt feels she just needs a referral because it is painful and hard to swallow. Pt also stated she has a hard time getting around and feels a phone consult would be best. Please advise. Thanks! ep

## 2016-07-30 NOTE — Telephone Encounter (Signed)
Please let Ms. Raker know that I have referred her to GI. She should hear from our office in 2-4 weeks to schedule this appointment. Thank you!

## 2016-07-30 NOTE — Telephone Encounter (Signed)
Will forward to MD to advise. [name],CMA  

## 2016-07-31 NOTE — Telephone Encounter (Signed)
I called pt to let her know I have canceled her apt for September and Dr. Brett Albino went ahead and made a GI referral for her. Pt did not answer. [place].

## 2016-08-01 ENCOUNTER — Other Ambulatory Visit: Payer: Self-pay | Admitting: Internal Medicine

## 2016-08-05 ENCOUNTER — Telehealth: Payer: Self-pay | Admitting: Internal Medicine

## 2016-08-05 ENCOUNTER — Other Ambulatory Visit: Payer: Self-pay | Admitting: Internal Medicine

## 2016-08-05 MED ORDER — OXYCODONE-ACETAMINOPHEN 10-325 MG PO TABS: 1.0000 | ORAL_TABLET | Freq: Three times a day (TID) | ORAL | 0 refills | Status: DC | PRN

## 2016-08-05 NOTE — Telephone Encounter (Signed)
Please let Kathleen Roy know that I have given her another 30 tablets of Percocet. She can have [name] come pick this up at our front desk or he can pick it up at his appointment with me next week. Thank you!

## 2016-08-05 NOTE — Telephone Encounter (Signed)
Will forward to MD. [name],CMA  

## 2016-08-05 NOTE — Telephone Encounter (Signed)
Received a phone call that Pt usually gets Percocet #120 since her prescription was changed from q8hrs to q6hrs. Pt was mostly recently prescribed #90 on her most recent refill. On chart review, it looks like we have been doing #120 each refill. Will prescribe an additional #30 for this month.   Pt has been referred to pain clinic. Awaiting appointment.  Hyman Bible, MD

## 2016-08-05 NOTE — Telephone Encounter (Signed)
Pt just noticed her oxycodone that it states to take it every 8 hours for pain. Dr Brett Albino had changed it for her to take it every 6 hours. This was months ago.  She is concerned about the quantity not being right. Pt also had discussed with Dr Brett Albino about getting home health to help with bumps on her leg

## 2016-08-06 NOTE — Telephone Encounter (Signed)
Spoke with patient and she is aware of script being ready for pick up.  States that she is still having more trouble with her cellulitis of her leg.  States that it weeping more than usual and will be out of abx tomorrow. Doesn't think it is looking better but possibly worse. States that she can wrap it and within an hour or 2 it is soaked.  Patient doesn't have any home health nursing right now to help with this.  Will forward to MD to see if patient could possibly get an order for home health to come out and evaluate for this concern and if patient would need a change in her abx. [name],CMA

## 2016-08-07 ENCOUNTER — Other Ambulatory Visit: Payer: Self-pay | Admitting: Internal Medicine

## 2016-08-07 MED ORDER — VENLAFAXINE HCL ER 150 MG PO CP24: 300.0000 mg | ORAL_CAPSULE | Freq: Every day | ORAL | 0 refills | Status: DC

## 2016-08-07 NOTE — Telephone Encounter (Signed)
Spoke with Pt on the phone regarding weeping ulcers. I let her know that I have ordered home health nursing to come out and evaluate her wounds because she is unable to come into clinic. At this time, will hold off on antibiotics because it doesn't seem like the redness of her lower extremities improved with the antibiotics. Unclear if she truly has cellulitis. She likely just needs to wrap her legs and keep them elevated. Also, let Pt know that I have refilled her Venlafaxine.  Hyman Bible, MD PGY-2

## 2016-08-07 NOTE — Telephone Encounter (Signed)
Needs refill on venalafaxine.  [facility] on [address].  Her leg is getting worse.  She is very concerned about her leg. It is weeping just like an open faucet.  Pt states she would come into the office but the leg is weeping too much.  Please advise

## 2016-08-09 ENCOUNTER — Telehealth: Payer: Self-pay | Admitting: *Deleted

## 2016-08-09 NOTE — Telephone Encounter (Signed)
Pt calling stating that she would like [facility] home [facility] to come to her house instead of [facility]. Please advise. Deseree Kennon Holter, CMA

## 2016-08-09 NOTE — Telephone Encounter (Signed)
Patient states that [facility] was supposed to come out today but no one has shown up yet.  I informed that her insurance isn't accepted by [facility] and that I would contact Danny Lawless with [facility].  He is going to check on this and call patient back directly. [name],CMA

## 2016-08-12 ENCOUNTER — Other Ambulatory Visit: Payer: Self-pay | Admitting: Internal Medicine

## 2016-08-12 ENCOUNTER — Telehealth: Payer: Self-pay | Admitting: Internal Medicine

## 2016-08-12 DIAGNOSIS — L97909 Non-pressure chronic ulcer of unspecified part of unspecified lower leg with unspecified severity: Principal | ICD-10-CM

## 2016-08-12 DIAGNOSIS — I83009 Varicose veins of unspecified lower extremity with ulcer of unspecified site: Secondary | ICD-10-CM

## 2016-08-12 NOTE — Progress Notes (Signed)
Spoke with patient over the phone. She states that the home health RN came out to the house, took one look at her legs, and stated "that is so far beyond cellulitis. You need to go to the hospital". Pt then took a shower and was preparing to go to the ED, but she then got stuck in the shower and couldn't get out. Her brother called EMS to help get her out of the shower. The paramedics also looked at her leg and stated that they didn't feel it was infected. They also said she probably doesn't need to go to the ED because there is not much that could be done for her there. The paramedics recommended that she talk with her PCP, so that is why she is called.   Again, she has not been able to come to clinic for me to assess her lower extremities. She has undergone treatment for presumed cellulitis with Doxycycline and Clindamycin, but this has not helped the redness. When I last saw her in clinic on 06/04/16, her she had a few small, weeping ulcers. I advised that she elevate her legs as much as possible throughout the day. She has not been able to come back to clinic since then.   Pt states that she is unclear if the home health RN will come back to the house. At this time, I will place an urgent referral to the wound care center for further evaluation. Discussed this with patient and she is in agreement with the plan.  Hyman Bible, MD PGY-2

## 2016-08-12 NOTE — Telephone Encounter (Signed)
Patient is requesting to speak to Dr. Brett Albino about there leg. She states that it is too much to put in a message but she did say that [facility] has been out to look at her leg along with paramedics (she got stuck in the shower few day ago and had to call). Please call patient.

## 2016-08-15 ENCOUNTER — Telehealth: Payer: Self-pay | Admitting: Internal Medicine

## 2016-08-15 NOTE — Telephone Encounter (Signed)
I spoke with Andee Poles, the home health nurse who evaluated Ms. Kawasaki's legs on 8/25. Danielle stated that she felt Ms. Grefe's legs were very infected and cellulitic. On 8/25, she recommend Ms. Goos go to the ED because she thinks she needs IV antibiotics. Danielle stated that she spoke with Dr. Gerlean Ren, who was on call that night, and Dr. Gerlean Ren agreed that she should be seen in the ED. Ms. Critcher never went to the ED.  It sounds like Ms. Macinnes's infection has progressed significantly since the last time I saw her in clinic two months ago. I have tried multiple times to have her come into clinic for evaluation but she is unable to due to her immobility. She has had three course of antibiotics over the last two months, which haven't really helped the redness. Per the home health RN, the infection seems to be much worse than what Ms. Huard has been describing to me over the phone.  I spoke with Ms. Moe and advised that she come to the ED. I have not been able to evaluate her legs since they worsened. I think she needs to be evaluated by a physician and she may benefit from IV antibiotics. Ms. Threet voiced understanding over the phone and stated that she would call an ambulance to bring her to the ED.  Hyman Bible, MD PGY-2

## 2016-08-16 ENCOUNTER — Inpatient Hospital Stay (HOSPITAL_COMMUNITY)
Admission: EM | Admit: 2016-08-16 | Discharge: 2016-08-22 | DRG: 603 | Disposition: A | Discharge status: Skilled Nursing Facility | Payer: Medicare Other | Attending: Family Medicine | Admitting: Family Medicine

## 2016-08-16 ENCOUNTER — Encounter (HOSPITAL_COMMUNITY): Payer: Self-pay | Admitting: Emergency Medicine

## 2016-08-16 ENCOUNTER — Emergency Department (HOSPITAL_COMMUNITY): Payer: Medicare Other

## 2016-08-16 ENCOUNTER — Inpatient Hospital Stay (HOSPITAL_COMMUNITY): Payer: Medicare Other

## 2016-08-16 DIAGNOSIS — Z66 Do not resuscitate: Secondary | ICD-10-CM | POA: Diagnosis present

## 2016-08-16 DIAGNOSIS — Z9181 History of falling: Secondary | ICD-10-CM | POA: Diagnosis not present

## 2016-08-16 DIAGNOSIS — Z90710 Acquired absence of both cervix and uterus: Secondary | ICD-10-CM | POA: Diagnosis not present

## 2016-08-16 DIAGNOSIS — Z6841 Body Mass Index (BMI) 40.0 and over, adult: Secondary | ICD-10-CM

## 2016-08-16 DIAGNOSIS — Z886 Allergy status to analgesic agent status: Secondary | ICD-10-CM

## 2016-08-16 DIAGNOSIS — G4700 Insomnia, unspecified: Secondary | ICD-10-CM | POA: Diagnosis not present

## 2016-08-16 DIAGNOSIS — J449 Chronic obstructive pulmonary disease, unspecified: Secondary | ICD-10-CM | POA: Diagnosis not present

## 2016-08-16 DIAGNOSIS — R7881 Bacteremia: Secondary | ICD-10-CM | POA: Diagnosis present

## 2016-08-16 DIAGNOSIS — Z9981 Dependence on supplemental oxygen: Secondary | ICD-10-CM

## 2016-08-16 DIAGNOSIS — F330 Major depressive disorder, recurrent, mild: Secondary | ICD-10-CM | POA: Diagnosis not present

## 2016-08-16 DIAGNOSIS — E876 Hypokalemia: Secondary | ICD-10-CM | POA: Diagnosis not present

## 2016-08-16 DIAGNOSIS — J9611 Chronic respiratory failure with hypoxia: Secondary | ICD-10-CM | POA: Diagnosis present

## 2016-08-16 DIAGNOSIS — J189 Pneumonia, unspecified organism: Secondary | ICD-10-CM | POA: Diagnosis not present

## 2016-08-16 DIAGNOSIS — E1142 Type 2 diabetes mellitus with diabetic polyneuropathy: Secondary | ICD-10-CM | POA: Diagnosis not present

## 2016-08-16 DIAGNOSIS — E875 Hyperkalemia: Secondary | ICD-10-CM | POA: Diagnosis not present

## 2016-08-16 DIAGNOSIS — I5032 Chronic diastolic (congestive) heart failure: Secondary | ICD-10-CM | POA: Diagnosis not present

## 2016-08-16 DIAGNOSIS — E119 Type 2 diabetes mellitus without complications: Secondary | ICD-10-CM

## 2016-08-16 DIAGNOSIS — Z1619 Resistance to other specified beta lactam antibiotics: Secondary | ICD-10-CM | POA: Diagnosis present

## 2016-08-16 DIAGNOSIS — M79609 Pain in unspecified limb: Secondary | ICD-10-CM

## 2016-08-16 DIAGNOSIS — B957 Other staphylococcus as the cause of diseases classified elsewhere: Secondary | ICD-10-CM | POA: Diagnosis not present

## 2016-08-16 DIAGNOSIS — E785 Hyperlipidemia, unspecified: Secondary | ICD-10-CM | POA: Diagnosis present

## 2016-08-16 DIAGNOSIS — L03119 Cellulitis of unspecified part of limb: Secondary | ICD-10-CM

## 2016-08-16 DIAGNOSIS — I878 Other specified disorders of veins: Secondary | ICD-10-CM | POA: Diagnosis present

## 2016-08-16 DIAGNOSIS — M79604 Pain in right leg: Secondary | ICD-10-CM | POA: Diagnosis not present

## 2016-08-16 DIAGNOSIS — L03115 Cellulitis of right lower limb: Principal | ICD-10-CM | POA: Diagnosis present

## 2016-08-16 DIAGNOSIS — J302 Other seasonal allergic rhinitis: Secondary | ICD-10-CM | POA: Diagnosis not present

## 2016-08-16 DIAGNOSIS — F339 Major depressive disorder, recurrent, unspecified: Secondary | ICD-10-CM | POA: Diagnosis present

## 2016-08-16 DIAGNOSIS — I110 Hypertensive heart disease with heart failure: Secondary | ICD-10-CM | POA: Diagnosis not present

## 2016-08-16 DIAGNOSIS — G8929 Other chronic pain: Secondary | ICD-10-CM | POA: Diagnosis present

## 2016-08-16 DIAGNOSIS — I872 Venous insufficiency (chronic) (peripheral): Secondary | ICD-10-CM | POA: Diagnosis not present

## 2016-08-16 DIAGNOSIS — L02419 Cutaneous abscess of limb, unspecified: Secondary | ICD-10-CM

## 2016-08-16 DIAGNOSIS — M797 Fibromyalgia: Secondary | ICD-10-CM | POA: Diagnosis not present

## 2016-08-16 DIAGNOSIS — J9621 Acute and chronic respiratory failure with hypoxia: Secondary | ICD-10-CM | POA: Diagnosis not present

## 2016-08-16 DIAGNOSIS — I10 Essential (primary) hypertension: Secondary | ICD-10-CM | POA: Diagnosis not present

## 2016-08-16 DIAGNOSIS — G9009 Other idiopathic peripheral autonomic neuropathy: Secondary | ICD-10-CM | POA: Diagnosis not present

## 2016-08-16 DIAGNOSIS — E662 Morbid (severe) obesity with alveolar hypoventilation: Secondary | ICD-10-CM | POA: Diagnosis present

## 2016-08-16 DIAGNOSIS — T502X5A Adverse effect of carbonic-anhydrase inhibitors, benzothiadiazides and other diuretics, initial encounter: Secondary | ICD-10-CM | POA: Diagnosis not present

## 2016-08-16 DIAGNOSIS — M25561 Pain in right knee: Secondary | ICD-10-CM | POA: Diagnosis present

## 2016-08-16 DIAGNOSIS — I4510 Unspecified right bundle-branch block: Secondary | ICD-10-CM | POA: Diagnosis present

## 2016-08-16 DIAGNOSIS — L304 Erythema intertrigo: Secondary | ICD-10-CM | POA: Diagnosis present

## 2016-08-16 DIAGNOSIS — Z7984 Long term (current) use of oral hypoglycemic drugs: Secondary | ICD-10-CM

## 2016-08-16 DIAGNOSIS — M25562 Pain in left knee: Secondary | ICD-10-CM | POA: Diagnosis not present

## 2016-08-16 DIAGNOSIS — Z87891 Personal history of nicotine dependence: Secondary | ICD-10-CM

## 2016-08-16 DIAGNOSIS — M7989 Other specified soft tissue disorders: Secondary | ICD-10-CM | POA: Diagnosis not present

## 2016-08-16 DIAGNOSIS — M25569 Pain in unspecified knee: Secondary | ICD-10-CM | POA: Diagnosis not present

## 2016-08-16 DIAGNOSIS — G6289 Other specified polyneuropathies: Secondary | ICD-10-CM | POA: Diagnosis not present

## 2016-08-16 DIAGNOSIS — M79606 Pain in leg, unspecified: Secondary | ICD-10-CM | POA: Diagnosis not present

## 2016-08-16 DIAGNOSIS — Z79899 Other long term (current) drug therapy: Secondary | ICD-10-CM

## 2016-08-16 DIAGNOSIS — G629 Polyneuropathy, unspecified: Secondary | ICD-10-CM

## 2016-08-16 HISTORY — DX: Cellulitis of unspecified part of limb: L03.119

## 2016-08-16 HISTORY — DX: Cutaneous abscess of limb, unspecified: L02.419

## 2016-08-16 LAB — CBC WITH DIFFERENTIAL/PLATELET
BASOS PCT: 0 %
Basophils Absolute: 0 10*3/uL (ref 0.0–0.1)
EOS ABS: 0 10*3/uL (ref 0.0–0.7)
Eosinophils Relative: 0 %
HCT: 40.8 % (ref 36.0–46.0)
HEMOGLOBIN: 12.4 g/dL (ref 12.0–15.0)
LYMPHS ABS: 1.6 10*3/uL (ref 0.7–4.0)
Lymphocytes Relative: 21 %
MCH: 29.2 pg (ref 26.0–34.0)
MCHC: 30.4 g/dL (ref 30.0–36.0)
MCV: 96.2 fL (ref 78.0–100.0)
Monocytes Absolute: 0.5 10*3/uL (ref 0.1–1.0)
Monocytes Relative: 7 %
NEUTROS ABS: 5.5 10*3/uL (ref 1.7–7.7)
Neutrophils Relative %: 72 %
PLATELETS: 218 10*3/uL (ref 150–400)
RBC: 4.24 MIL/uL (ref 3.87–5.11)
RDW: 15.9 % — AB (ref 11.5–15.5)
WBC: 7.6 10*3/uL (ref 4.0–10.5)

## 2016-08-16 LAB — I-STAT CG4 LACTIC ACID, ED: Lactic Acid, Venous: 1.68 mmol/L (ref 0.5–1.9)

## 2016-08-16 LAB — COMPREHENSIVE METABOLIC PANEL
ALBUMIN: 3.3 g/dL — AB (ref 3.5–5.0)
ALK PHOS: 72 U/L (ref 38–126)
ALT: 11 U/L — AB (ref 14–54)
ANION GAP: 12 (ref 5–15)
AST: 14 U/L — ABNORMAL LOW (ref 15–41)
BUN: 19 mg/dL (ref 6–20)
CALCIUM: 9.8 mg/dL (ref 8.9–10.3)
CHLORIDE: 87 mmol/L — AB (ref 101–111)
CO2: 39 mmol/L — AB (ref 22–32)
Creatinine, Ser: 1.03 mg/dL — ABNORMAL HIGH (ref 0.44–1.00)
GFR calc Af Amer: >60 mL/min (ref >60)
GFR calc non Af Amer: >60 mL/min (ref >60)
GLUCOSE: 171 mg/dL — AB (ref 65–99)
Potassium: 3.1 mmol/L — ABNORMAL LOW (ref 3.5–5.1)
SODIUM: 138 mmol/L (ref 135–145)
Total Bilirubin: 0.2 mg/dL — ABNORMAL LOW (ref 0.3–1.2)
Total Protein: 6.7 g/dL (ref 6.5–8.1)

## 2016-08-16 LAB — GLUCOSE, CAPILLARY
GLUCOSE-CAPILLARY: 194 mg/dL — AB (ref 65–99)
Glucose-Capillary: 105 mg/dL — ABNORMAL HIGH (ref 65–99)
Glucose-Capillary: 121 mg/dL — ABNORMAL HIGH (ref 65–99)

## 2016-08-16 LAB — TROPONIN I: Troponin I: <0.03 ng/mL (ref <0.03)

## 2016-08-16 MED ORDER — OXYCODONE HCL 5 MG PO TABS
5.0000 mg | ORAL_TABLET | Freq: Four times a day (QID) | ORAL | Status: DC | PRN
  Administered 2016-08-16 – 2016-08-20 (×8): 5 mg via ORAL
  Filled 2016-08-16 (×8): qty 1

## 2016-08-16 MED ORDER — VANCOMYCIN HCL 10 G IV SOLR: 1250.0000 mg | Freq: Two times a day (BID) | INTRAVENOUS | Status: DC

## 2016-08-16 MED ORDER — CYCLOBENZAPRINE HCL 10 MG PO TABS: 10.0000 mg | ORAL_TABLET | Freq: Three times a day (TID) | ORAL | Status: DC | PRN

## 2016-08-16 MED ORDER — NYSTATIN 100000 UNIT/GM EX POWD
Freq: Two times a day (BID) | CUTANEOUS | Status: DC
  Administered 2016-08-16: 22:00:00 via TOPICAL
  Administered 2016-08-16: 1 via TOPICAL
  Administered 2016-08-17 – 2016-08-22 (×10): via TOPICAL
  Filled 2016-08-16 (×2): qty 15

## 2016-08-16 MED ORDER — VANCOMYCIN HCL 10 G IV SOLR
1250.0000 mg | Freq: Two times a day (BID) | INTRAVENOUS | Status: DC
  Administered 2016-08-16 – 2016-08-19 (×6): 1250 mg via INTRAVENOUS
  Filled 2016-08-16 (×6): qty 1250

## 2016-08-16 MED ORDER — SODIUM CHLORIDE 0.9 % IV BOLUS (SEPSIS): 500.0000 mL | Freq: Once | INTRAVENOUS | Status: DC

## 2016-08-16 MED ORDER — ARIPIPRAZOLE 2 MG PO TABS
2.0000 mg | ORAL_TABLET | ORAL | Status: DC
  Administered 2016-08-17 – 2016-08-21 (×3): 2 mg via ORAL
  Filled 2016-08-16 (×3): qty 1

## 2016-08-16 MED ORDER — POLYETHYLENE GLYCOL 3350 17 G PO PACK
17.0000 g | PACK | Freq: Every day | ORAL | Status: DC
  Administered 2016-08-16 – 2016-08-20 (×5): 17 g via ORAL
  Filled 2016-08-16 (×6): qty 1

## 2016-08-16 MED ORDER — TRAZODONE HCL 100 MG PO TABS
300.0000 mg | ORAL_TABLET | Freq: Every day | ORAL | Status: DC
  Administered 2016-08-16 – 2016-08-21 (×6): 300 mg via ORAL
  Filled 2016-08-16 (×7): qty 3

## 2016-08-16 MED ORDER — PIPERACILLIN-TAZOBACTAM 3.375 G IVPB
3.3750 g | Freq: Three times a day (TID) | INTRAVENOUS | Status: DC
  Administered 2016-08-16 – 2016-08-17 (×3): 3.375 g via INTRAVENOUS
  Filled 2016-08-16 (×6): qty 50

## 2016-08-16 MED ORDER — OXYCODONE-ACETAMINOPHEN 5-325 MG PO TABS
1.0000 | ORAL_TABLET | Freq: Four times a day (QID) | ORAL | Status: DC | PRN
Start: 2016-08-16 — End: 2016-08-20
  Administered 2016-08-16 – 2016-08-20 (×8): 1 via ORAL
  Filled 2016-08-16 (×8): qty 1

## 2016-08-16 MED ORDER — VANCOMYCIN HCL 10 G IV SOLR
2000.0000 mg | Freq: Once | INTRAVENOUS | Status: AC
  Administered 2016-08-16: 2000 mg via INTRAVENOUS
  Filled 2016-08-16: qty 2000

## 2016-08-16 MED ORDER — VENLAFAXINE HCL ER 75 MG PO CP24
300.0000 mg | ORAL_CAPSULE | Freq: Every day | ORAL | Status: DC
  Administered 2016-08-16 – 2016-08-22 (×7): 300 mg via ORAL
  Filled 2016-08-16 (×6): qty 4

## 2016-08-16 MED ORDER — INSULIN ASPART 100 UNIT/ML ~~LOC~~ SOLN
0.0000 [IU] | Freq: Three times a day (TID) | SUBCUTANEOUS | Status: DC
  Administered 2016-08-16 – 2016-08-17 (×2): 2 [IU] via SUBCUTANEOUS
  Administered 2016-08-20: 1 [IU] via SUBCUTANEOUS
  Administered 2016-08-21: 2 [IU] via SUBCUTANEOUS
  Administered 2016-08-22: 1 [IU] via SUBCUTANEOUS

## 2016-08-16 MED ORDER — PIPERACILLIN-TAZOBACTAM 3.375 G IVPB
3.3750 g | Freq: Three times a day (TID) | INTRAVENOUS | Status: DC
  Administered 2016-08-16: 3.375 g via INTRAVENOUS
  Filled 2016-08-16 (×2): qty 50

## 2016-08-16 MED ORDER — POTASSIUM CHLORIDE CRYS ER 20 MEQ PO TBCR
20.0000 meq | EXTENDED_RELEASE_TABLET | Freq: Two times a day (BID) | ORAL | Status: AC
  Administered 2016-08-16: 20 meq via ORAL
  Filled 2016-08-16: qty 1

## 2016-08-16 MED ORDER — PREGABALIN 75 MG PO CAPS
150.0000 mg | ORAL_CAPSULE | Freq: Two times a day (BID) | ORAL | Status: DC
  Administered 2016-08-16 – 2016-08-22 (×13): 150 mg via ORAL
  Filled 2016-08-16 (×13): qty 2

## 2016-08-16 MED ORDER — PRAVASTATIN SODIUM 40 MG PO TABS
40.0000 mg | ORAL_TABLET | Freq: Every day | ORAL | Status: DC
  Administered 2016-08-16 – 2016-08-21 (×6): 40 mg via ORAL
  Filled 2016-08-16 (×6): qty 1

## 2016-08-16 MED ORDER — ENOXAPARIN SODIUM 40 MG/0.4ML ~~LOC~~ SOLN
40.0000 mg | SUBCUTANEOUS | Status: DC
  Administered 2016-08-16 – 2016-08-21 (×6): 40 mg via SUBCUTANEOUS
  Filled 2016-08-16 (×6): qty 0.4

## 2016-08-16 MED ORDER — HYDROCERIN EX CREA
TOPICAL_CREAM | Freq: Every day | CUTANEOUS | Status: DC
  Administered 2016-08-17 – 2016-08-22 (×6): via TOPICAL
  Filled 2016-08-16 (×2): qty 113

## 2016-08-16 MED ORDER — OXYCODONE-ACETAMINOPHEN 10-325 MG PO TABS: 1.0000 | ORAL_TABLET | Freq: Four times a day (QID) | ORAL | Status: DC | PRN

## 2016-08-16 NOTE — Progress Notes (Signed)
Pharmacy Antibiotic Note  Kathleen Roy is a 55 y.o. female admitted on 08/16/2016 with cellulitis.  Pharmacy has been consulted for Vancomycin/Zosyn dosing. Pt with progressive cellulitis that appears refractory to outpatient antibiotics, WBC WNL. Normalized CrCl is ~71 mL/min.   Plan: -Vancomycin 2000 mg IV x 1, then 1250 mg IV q12h -Zosyn 3.375G IV q8h to be infused over 4 hours -Trend WBC, temp, renal function  -Drug levels at steady state  Temp (24hrs), Avg:97.6 F (36.4 C), Min:97.6 F (36.4 C), Max:97.6 F (36.4 C)   Recent Labs Lab 08/16/16 0306 08/16/16 0321  WBC 7.6  --   LATICACIDVEN  --  1.68    CrCl cannot be calculated (Unknown ideal weight.).    Allergies  Allergen Reactions  . Aspirin Hives    REACTION: hives Hasn't had in years    Narda Bonds 08/16/2016 3:48 AM

## 2016-08-16 NOTE — ED Provider Notes (Signed)
Kathleen Roy   CSN: [id number] Arrival date & time: 08/16/16  0215  By signing my name below, I, Kathleen Roy, attest that this documentation has been prepared under the direction and in the presence of physician practitioner, Kathleen Greek, MD. Electronically Signed: Dora Roy, Scribe. 08/16/2016. 2:31 AM.  History   Chief Complaint Chief Complaint  Patient presents with  . Leg Pain    The history is provided by the patient. No language interpreter was used.     HPI Comments: Kathleen Roy is a 55 y.o. female brought in by EMS, with PMHx of DM type II, neuropathy, and peripheral edema who presents to the Emergency Department complaining of possible infection to her right lower leg. Pt states she has experienced cellulitis of her right lower leg for many years, but states her current symptoms are more severe than they have ever been. She notes associated pain and swelling of her RLE. Pt states she was using clindamycin oral 3 weeks ago but has not taken any antibiotics since then. She reports she usually experiences cellulitis, then weeping, then scabbing. Pt states she picks the scabs off. She denies fever or any other associated symptoms.  Pt also complains of bilateral knee pain since a fall 3 weeks ago.   Past Medical History:  Diagnosis Date  . Arthritis    "I feel like it's everywhere" (11/08/2014)  . Chronic lower back pain   . COPD (chronic obstructive pulmonary disease) ([place])   . Depression   . Fibromyalgia   . Headache    "at least weekly; it's usually when I first wake up" (11/08/2014)  . Hyperlipidemia   . Hypertension   . Memory disturbance   . Migraines 1970's - <2000   "they just went away"  . Neuropathy ([place])   . Normal echocardiogram 05/30/05   suboptimal study  . Obesity, morbid (more than 100 lbs over ideal weight or BMI > 40) (HCC)    obese since childhood  . Osteoarthritis   . Peripheral edema   . Pressure ulcer of foot     left  . Sepsis ([place])   . Shortness of breath dyspnea   . Sleep apnea    "recently dx'd; haven't got my equipment yet" (11/08/2014)    Patient Active Problem List   Diagnosis Date Noted  . Cellulitis 06/04/2016  . Type 2 diabetes mellitus ([place]) 02/14/2016  . Pressure ulcer 01/16/2016  . Acute on chronic respiratory failure with hypercapnia ([place])   . Chronic diastolic congestive heart failure ([place])   . Essential hypertension   . CAP (community acquired pneumonia) 01/15/2016  . Bilateral knee pain 03/22/2015  . OSA (obstructive sleep apnea)   . Obesity hypoventilation syndrome ([place])   . Chronic respiratory failure with hypoxia ([place])   . Assistance needed for grooming 11/08/2014  . Fatigue 11/03/2014  . Memory loss 03/18/2014  . Tremor 03/18/2014  . Vulvovaginal candidiasis 11/25/2013  . Restrictive airway disease 08/26/2013  . Hypokalemia 08/19/2013  . Health care maintenance 06/17/2013  . COPD (chronic obstructive pulmonary disease) ([place]) 10/15/2011  . Peripheral neuropathy ([place]) 10/15/2011  . KNEE PAIN, BILATERAL 01/22/2011  . BACK PAIN, LUMBAR, CHRONIC 01/22/2011  . MIGRAINE HEADACHE 10/25/2010  . HYPERLIPIDEMIA 04/07/2009  . EDEMA, CHRONIC 06/27/2008  . Morbid obesity ([place]) 11/16/2007  . DEPRESSION, MAJOR, RECURRENT 02/12/2007  . PANIC ATTACKS 02/12/2007  . HYPERTENSION, BENIGN SYSTEMIC 02/12/2007    Past Surgical History:  Procedure Laterality Date  . ABDOMINAL HERNIA REPAIR  2002  . APPENDECTOMY  1995  . CHOLECYSTECTOMY  1995  . TOTAL ABDOMINAL HYSTERECTOMY  1994    OB History    Gravida Para Term Preterm AB Living   0 0 0 0 0 0   SAB TAB Ectopic Multiple Live Births   0 0 0 0         Home Medications    Prior to Admission medications   Medication Sig Start Date End Date Taking? Authorizing Provider  ARIPiprazole (ABILIFY) 2 MG tablet take 1 tablet by mouth every other day 03/08/16  Yes Sela Hua, MD  Ascorbic Acid (VITAMIN C) 1000 MG tablet  Take 3,000 mg by mouth at bedtime.    Yes Historical Provider, MD  cetirizine (ZYRTEC) 10 MG tablet Take 10 mg by mouth daily.   Yes Historical Provider, MD  cyclobenzaprine (FLEXERIL) 10 MG tablet Take 1 tablet (10 mg total) by mouth 3 (three) times daily as needed for muscle spasms. 06/17/16  Yes Sela Hua, MD  glipiZIDE (GLUCOTROL) 5 MG tablet take 1 tablet by mouth once daily 06/28/16  Yes Sela Hua, MD  LYRICA 150 MG capsule take 1 capsule by mouth twice a day 03/08/16  Yes Sela Hua, MD  nystatin (MYCOSTATIN) powder Apply topically 2 (two) times daily. Apply to affected skin BID Patient taking differently: Apply 1 g topically 2 (two) times daily as needed (yeast). Apply to affected skin BID 10/26/15  Yes [name], MD  Omega-3 Fatty Acids (FISH OIL) 1000 MG CAPS Take 2 capsules (2,000 mg total) by mouth at bedtime. 06/10/16  Yes Sela Hua, MD  oxyCODONE-acetaminophen (PERCOCET) 10-325 MG tablet Take 1 tablet by mouth every 8 (eight) hours as needed for pain. Patient taking differently: Take 1 tablet by mouth every 6 (six) hours.  08/05/16  Yes Sela Hua, MD  polyethylene glycol [facility] / Floria Raveling) packet Take 17 g by mouth at bedtime.    Yes Historical Provider, MD  potassium chloride SA (K-DUR,KLOR-CON) 20 MEQ tablet Take 2 tablets (40 mEq total) by mouth daily. 06/04/16  Yes Sela Hua, MD  pravastatin (PRAVACHOL) 40 MG tablet Take 1 tablet (40 mg total) by mouth at bedtime. 04/02/16  Yes Sela Hua, MD  torsemide (DEMADEX) 20 MG tablet take 2 tablets by mouth twice a day Patient taking differently: Take 4 tablets in the afternoon. 03/08/16  Yes Sela Hua, MD  traZODone (DESYREL) 150 MG tablet Take 2 tablets (300 mg total) by mouth at bedtime. 04/02/16  Yes Sela Hua, MD  triamterene-hydrochlorothiazide (MAXZIDE-25) 37.5-25 MG tablet Take 1 tablet by mouth daily. 06/04/16  Yes Sela Hua, MD  venlafaxine XR (EFFEXOR-XR) 150 MG 24 hr capsule Take 2  capsules (300 mg total) by mouth daily. 08/07/16  Yes Sela Hua, MD    Family History Family History  Problem Relation Age of Onset  . Stroke Mother   . Dementia Father   . Prostate cancer Father   . Congestive Heart Failure Brother   . Diabetes Brother   . Hypertension Brother     Social History Social History  Substance Use Topics  . Smoking status: Former Smoker    Packs/day: 1.00    Years: 30.00    Types: Cigarettes    Quit date: 01/05/2009  . Smokeless tobacco: Never Used  . Alcohol use No     Allergies   Aspirin   Review of Systems Review of Systems  Constitutional: Negative  for fever.  Musculoskeletal: Positive for arthralgias (bilateral knees), joint swelling and myalgias.  Skin: Positive for rash.  All other systems reviewed and are negative.   Physical Exam Updated Vital Signs BP 105/60   Pulse 93   Temp 97.6 F (36.4 C) (Oral)   Resp 25   SpO2 97%   Physical Exam  Constitutional: She is oriented to person, place, and time. She appears well-developed and well-nourished. No distress.  HENT:  Head: Normocephalic and atraumatic.  Right Ear: Hearing normal.  Left Ear: Hearing normal.  Nose: Nose normal.  Mouth/Throat: Oropharynx is clear and moist and mucous membranes are normal.  Eyes: Conjunctivae and EOM are normal. Pupils are equal, round, and reactive to light.  Neck: Normal range of motion. Neck supple.  Cardiovascular: Regular rhythm, S1 normal and S2 normal.  Exam reveals no gallop and no friction rub.   No murmur heard. Pulmonary/Chest: Effort normal and breath sounds normal. No respiratory distress. She exhibits no tenderness.  Abdominal: Soft. Normal appearance and bowel sounds are normal. There is no hepatosplenomegaly. There is no tenderness. There is no rebound, no guarding, no tenderness at McBurney's point and negative Murphy's sign. No hernia.  Musculoskeletal: Normal range of motion.  Large excoriated erythematous draining area  of the right lower leg.  Neurological: She is alert and oriented to person, place, and time. She has normal strength. No cranial nerve deficit or sensory deficit. Coordination normal. GCS eye subscore is 4. GCS verbal subscore is 5. GCS motor subscore is 6.  Skin: Skin is warm, dry and intact. No rash noted. No cyanosis.  Psychiatric: She has a normal mood and affect. Her speech is normal and behavior is normal. Thought content normal.  Nursing Roy and vitals reviewed.   ED Treatments / Results  Labs (all labs ordered are listed, but only abnormal results are displayed) Labs Reviewed  CBC WITH DIFFERENTIAL/PLATELET - Abnormal; Notable for the following:       Result Value   RDW 15.9 (*)    All other components within normal limits  COMPREHENSIVE METABOLIC PANEL - Abnormal; Notable for the following:    Potassium 3.1 (*)    Chloride 87 (*)    CO2 39 (*)    Glucose, Bld 171 (*)    Creatinine, Ser 1.03 (*)    Albumin 3.3 (*)    AST 14 (*)    ALT 11 (*)    Total Bilirubin 0.2 (*)    All other components within normal limits  CULTURE, BLOOD (ROUTINE X 2)  CULTURE, BLOOD (ROUTINE X 2)  I-STAT CG4 LACTIC ACID, ED    EKG  EKG Interpretation None       Radiology Dg Tibia/fibula Right  Result Date: 08/16/2016 CLINICAL DATA:  Cellulitis of right lower leg with worsening of symptoms. Right lower extremity swelling. EXAM: RIGHT KNEE - COMPLETE 4+ VIEW; RIGHT TIBIA AND FIBULA - 2 VIEW COMPARISON:  None. FINDINGS: Right knee: There is mild femorotibial compartment degenerative change including marginal small osteophytes. There is a large superior patellar osteophyte. There is no acute fracture or dislocation. There is no knee effusion. No osteolysis. Right tibia and fibula: There is no acute fracture or dislocation of the right tibia or fibula. No abnormal periosteal reaction or focal erosion. No findings to suggest active osteitis. IMPRESSION: 1. No acute fracture or dislocation of the  right knee, tibia or fibula. 2. No evidence of infectious osteitis. Electronically Signed   By: Cletus Gash.D.  On: 08/16/2016 04:06   Dg Knee Complete 4 Views Left  Result Date: 08/16/2016 CLINICAL DATA:  Possible cellulitis.  Pain and swelling. EXAM: LEFT KNEE - COMPLETE 4+ VIEW COMPARISON:  None. FINDINGS: No acute fracture or dislocation of the left knee. Mild marginal osteophytosis at the medial femorotibial joint space. No sizable knee effusion. Small superior patellar osteophyte. No periosteal reaction or focal erosion. IMPRESSION: No acute fracture or dislocation at the left knee. No evidence of active infectious osteitis. Electronically Signed   By: Ulyses Jarred M.D.   On: 08/16/2016 04:09   Dg Knee Complete 4 Views Right  Result Date: 08/16/2016 CLINICAL DATA:  Cellulitis of right lower leg with worsening of symptoms. Right lower extremity swelling. EXAM: RIGHT KNEE - COMPLETE 4+ VIEW; RIGHT TIBIA AND FIBULA - 2 VIEW COMPARISON:  None. FINDINGS: Right knee: There is mild femorotibial compartment degenerative change including marginal small osteophytes. There is a large superior patellar osteophyte. There is no acute fracture or dislocation. There is no knee effusion. No osteolysis. Right tibia and fibula: There is no acute fracture or dislocation of the right tibia or fibula. No abnormal periosteal reaction or focal erosion. No findings to suggest active osteitis. IMPRESSION: 1. No acute fracture or dislocation of the right knee, tibia or fibula. 2. No evidence of infectious osteitis. Electronically Signed   By: Ulyses Jarred M.D.   On: 08/16/2016 04:06    Procedures Procedures (including critical care time)  DIAGNOSTIC STUDIES: Oxygen Saturation is 100% on RA, normal by my interpretation.    COORDINATION OF CARE: 5:04 AM Discussed treatment plan with pt at bedside and pt agreed to plan.  Medications Ordered in ED Medications  vancomycin (VANCOCIN) 2,000 mg in sodium chloride 0.9  % 500 mL IVPB (2,000 mg Intravenous New Bag/Given 08/16/16 0351)  piperacillin-tazobactam (ZOSYN) IVPB 3.375 g (3.375 g Intravenous New Bag/Given 08/16/16 0312)  vancomycin (VANCOCIN) 1,250 mg in sodium chloride 0.9 % 250 mL IVPB (not administered)     Initial Impression / Assessment and Plan / ED Course  I have reviewed the triage vital signs and the nursing notes.  Pertinent labs & imaging results that were available during my care of the patient were reviewed by me and considered in my medical decision making (see chart for details).  Clinical Course    Patient presents to the ER for evaluation of infection of her right leg. Patient reports that she has had ongoing issues with cellulitis of the right lower leg. She was seen by her primary care doctor 2 or 3 months ago and had several courses of clindamycin. She reports that since that time the area has worsened. She has been unable to get to her doctor because of difficulty with ambulation, home health has been arranged for her. The home health nurse saw her several days ago and recommended she go to the ER for evaluation but she did not come. She was seen again today and symptoms have been noted to be significantly worse, now presents for evaluation. Examination reveals significant erythema, swelling, weeping and malodorous drainage from the leg consistent with acute infection. No evidence of sepsis. Patient initiated on broad-spectrum IV antibiotic coverage and will require hospitalization.  I personally performed the services described in this documentation, which was scribed in my presence. The recorded information has been reviewed and is accurate.   Final Clinical Impressions(s) / ED Diagnoses   Final diagnoses:  Cellulitis of right lower extremity    New Prescriptions New Prescriptions  No medications on file     Kathleen Greek, MD 08/16/16 [phone]

## 2016-08-16 NOTE — ED Notes (Signed)
Admitting MD at bedside.

## 2016-08-16 NOTE — ED Notes (Signed)
EDP at bedside  

## 2016-08-16 NOTE — H&P (Signed)
[facility] Admission History and Physical Service Pager: [phone]  [name] Medical record number: [id number] Date of birth: [birth year] Age: 55 y.o. Gender: female  Primary Care Provider: Evette Doffing, MD Consultants: none Code Status: DNR  Chief Complaint: RLE Cellulitis  Assessment and Plan: Kathleen Roy is a 55 y.o. female presenting with RLE redness and pain  . PMH is significant for morbid obesity, depression, HTN, panic attacks, HLD, chronic pain, OSA, obesity hypoventilation syndrome (4L at home), chronic diastolic HF, DM2, venous stasis   Cellulitis of RLE: No leukocytosis. Lactic acid wnl. Afebrile. However, clinically impressive (images noted below) - admit to telemetry, attending Dr. Wendy Poet  - Vanc and Zosyn per pharmacy  - given her immobility at home, will order bilateral LE dopplers to evaluate for DVT.  - try to assess pulses with dopplers. If unable, consider ABIs  Chronic Venous Stasis:  - wound care consult  DM:  Last A1c 6.3 in 05/2016. Takes Glipizide PRN (when cbg > 100).  - CBGs ACHS  - sensitive SSI  HTN: Blood pressures in the low [id number] systolic.  - holding home Triamtereme-HCTZ - holding home Torsemide - mildly dry on exam. Will order 500cc bolus    Peripheral Neuropathy: - home Lyrica  Chronic Pain: - Flexeril TID PRN - Percocet q 6 hr PRN   Bilateral Knee Pain after recent fall: x-rays of bilateral knees without acute process. - Percocet q 6 PRN   - consider PT consult   HLD:  - pravastatin  Mood Disorder/Insomnia:  - home Trazodone  - home Venlafaxine  - Abilify (takes every other day)   FEN/GI: heart healthy carb modified Prophylaxis: Lovenox  Disposition: admit for further management.   History of Present Illness:  Kathleen Roy is a 55 y.o. female presenting with RLE pain, weeping, increased redness, and yellow crusting of skin for the past 1-2 weeks. Per chart review, these symptoms  have been noted since 05/22/2016. Patient has been prescribed PO antibiotics (Doxycycline) multiple times and symptoms seemed to improve for a short period of time but would return after completing the course of antibiotics. Patient was last prescribed Clindamycin in July/2017. Patient had been unable to return to clinic, therefore a home health nurse went to see patient. Home health nurse spoke with patient's PCP to report the above symptoms. On 8/25, the nurse recommended going to the ED to receive IV antibiotics but patient never went. Patient's  PCP spoke with patient on 8/31 and was advised to go the ED for further evaluation. Patient denies having fevers. Reports of chills; uncertain when this started. Normal PO intake. Normal BM and urine output. Reports no recent increased swelling of lower extremities; but has had increased swelling "over the years".   Patient is very immobile at home and reports sitting all day. Denies calf pain. Denies SOB. On her home O2 of 4L. Reports of chest pain about 2-3 weeks ago that was associated with swallowing fluids or solid foods. This lasted for a few days. No associated nausea, diaphoresis, or sob to her recollection. No personal history of MI. Denies family history of MI.   Also reports she fell on her bilateral knees less than a month ago while trying to enter her house. Reports of worsened bilateral knee pain since then; difficult to walk due to her pain.   Denies tobacco use, alcohol, or illicit drug use.    In the ED, patient afebrile. With SBP in  low 100s. LA wnl. No leukocytosis. Blood cultures ordered. Vanc and Zosyn given.   Review Of Systems: Per HPI  Otherwise the remainder of the systems were negative.  Patient Active Problem List   Diagnosis Date Noted  . Cellulitis 06/04/2016  . Type 2 diabetes mellitus ([place]) 02/14/2016  . Pressure ulcer 01/16/2016  . Acute on chronic respiratory failure with hypercapnia ([place])   . Chronic diastolic  congestive heart failure ([place])   . Essential hypertension   . CAP (community acquired pneumonia) 01/15/2016  . Bilateral knee pain 03/22/2015  . OSA (obstructive sleep apnea)   . Obesity hypoventilation syndrome ([place])   . Chronic respiratory failure with hypoxia ([place])   . Assistance needed for grooming 11/08/2014  . Fatigue 11/03/2014  . Memory loss 03/18/2014  . Tremor 03/18/2014  . Vulvovaginal candidiasis 11/25/2013  . Restrictive airway disease 08/26/2013  . Hypokalemia 08/19/2013  . Health care maintenance 06/17/2013  . COPD (chronic obstructive pulmonary disease) ([place]) 10/15/2011  . Peripheral neuropathy ([place]) 10/15/2011  . KNEE PAIN, BILATERAL 01/22/2011  . BACK PAIN, LUMBAR, CHRONIC 01/22/2011  . MIGRAINE HEADACHE 10/25/2010  . HYPERLIPIDEMIA 04/07/2009  . EDEMA, CHRONIC 06/27/2008  . Morbid obesity ([place]) 11/16/2007  . DEPRESSION, MAJOR, RECURRENT 02/12/2007  . PANIC ATTACKS 02/12/2007  . HYPERTENSION, BENIGN SYSTEMIC 02/12/2007    Past Medical History: Past Medical History:  Diagnosis Date  . Arthritis    "I feel like it's everywhere" (11/08/2014)  . Chronic lower back pain   . COPD (chronic obstructive pulmonary disease) ([place])   . Depression   . Fibromyalgia   . Headache    "at least weekly; it's usually when I first wake up" (11/08/2014)  . Hyperlipidemia   . Hypertension   . Memory disturbance   . Migraines 1970's - <2000   "they just went away"  . Neuropathy ([place])   . Normal echocardiogram 05/30/05   suboptimal study  . Obesity, morbid (more than 100 lbs over ideal weight or BMI > 40) (HCC)    obese since childhood  . Osteoarthritis   . Peripheral edema   . Pressure ulcer of foot    left  . Sepsis ([place])   . Shortness of breath dyspnea   . Sleep apnea    "recently dx'd; haven't got my equipment yet" (11/08/2014)    Past Surgical History: Past Surgical History:  Procedure Laterality Date  . ABDOMINAL HERNIA REPAIR  2002  . APPENDECTOMY  1995    . CHOLECYSTECTOMY  1995  . TOTAL ABDOMINAL HYSTERECTOMY  1994    Social History: Social History  Substance Use Topics  . Smoking status: Former Smoker    Packs/day: 1.00    Years: 30.00    Types: Cigarettes    Quit date: 01/05/2009  . Smokeless tobacco: Never Used  . Alcohol use No   Additional social history: Please also refer to relevant sections of EMR.  Family History: Family History  Problem Relation Age of Onset  . Stroke Mother   . Dementia Father   . Prostate cancer Father   . Congestive Heart Failure Brother   . Diabetes Brother   . Hypertension Brother      Allergies and Medications: Allergies  Allergen Reactions  . Aspirin Hives    REACTION: hives Hasn't had in years    No current facility-administered medications on file prior to encounter.    Current Outpatient Prescriptions on File Prior to Encounter  Medication Sig Dispense Refill  . ARIPiprazole (ABILIFY) 2 MG tablet  take 1 tablet by mouth every other day 45 tablet 0  . Ascorbic Acid (VITAMIN C) 1000 MG tablet Take 3,000 mg by mouth at bedtime.     . cetirizine (ZYRTEC) 10 MG tablet Take 10 mg by mouth daily.    . cyclobenzaprine (FLEXERIL) 10 MG tablet Take 1 tablet (10 mg total) by mouth 3 (three) times daily as needed for muscle spasms. 90 tablet 2  . glipiZIDE (GLUCOTROL) 5 MG tablet take 1 tablet by mouth once daily 30 tablet 2  . LYRICA 150 MG capsule take 1 capsule by mouth twice a day 180 capsule 1  . nystatin (MYCOSTATIN) powder Apply topically 2 (two) times daily. Apply to affected skin BID (Patient taking differently: Apply 1 g topically 2 (two) times daily as needed (yeast). Apply to affected skin BID) 360 g 6  . Omega-3 Fatty Acids (FISH OIL) 1000 MG CAPS Take 2 capsules (2,000 mg total) by mouth at bedtime. 60 capsule 2  . oxyCODONE-acetaminophen (PERCOCET) 10-325 MG tablet Take 1 tablet by mouth every 8 (eight) hours as needed for pain. (Patient taking differently: Take 1 tablet by mouth  every 6 (six) hours. ) 30 tablet 0  . polyethylene glycol (MIRALAX / GLYCOLAX) packet Take 17 g by mouth at bedtime.     . potassium chloride SA (K-DUR,KLOR-CON) 20 MEQ tablet Take 2 tablets (40 mEq total) by mouth daily. 180 tablet 0  . pravastatin (PRAVACHOL) 40 MG tablet Take 1 tablet (40 mg total) by mouth at bedtime. 90 tablet 1  . torsemide (DEMADEX) 20 MG tablet take 2 tablets by mouth twice a day (Patient taking differently: Take 4 tablets in the afternoon.) 360 tablet 1  . traZODone (DESYREL) 150 MG tablet Take 2 tablets (300 mg total) by mouth at bedtime. 180 tablet 1  . triamterene-hydrochlorothiazide (MAXZIDE-25) 37.5-25 MG tablet Take 1 tablet by mouth daily. 90 tablet 0  . venlafaxine XR (EFFEXOR-XR) 150 MG 24 hr capsule Take 2 capsules (300 mg total) by mouth daily. 180 capsule 0    Objective: BP (!) 111/52   Pulse 92   Temp 97.6 F (36.4 C) (Oral)   Resp 23   SpO2 92%  Exam: GEN: NAD, morbid obesity HEENT: Atraumatic, normocephalic, neck supple, EOMI, sclera clear  CV: distant heart sounds due to body habitus, RRR, no murmurs, rubs, or gallops PULM: CTAB, normal effort ABD: Soft, nontender, nondistended, NABS, no organomegaly SKIN: note below.  EXTR: Extremities: chronic skin changes from venous stasis. LLE: minimal erythema without increased warmth. RLE: minimal LE erythema without increased warmth  PSYCH: Mood and affect euthymic, normal rate and volume of speech NEURO: Awake, alert, no focal deficits grossly, normal speech  Pannus:    Right LE:        Left LE:     Labs and Imaging: CBC BMET   Recent Labs Lab 08/16/16 0306  WBC 7.6  HGB 12.4  HCT 40.8  PLT 218    Recent Labs Lab 08/16/16 0306  NA 138  K 3.1*  CL 87*  CO2 39*  BUN 19  CREATININE 1.03*  GLUCOSE 171*  CALCIUM 9.8    Lactic Acid: 1.68 EKG: NSR, RBBB, Left anterior fascicular block. Unchanged from prior.   Smiley Houseman, MD 08/16/2016, 6:11 AM PGY-2, [facility] pager: [phone], text pages welcome

## 2016-08-16 NOTE — Progress Notes (Signed)
VASCULAR LAB PRELIMINARY  PRELIMINARY  PRELIMINARY  PRELIMINARY  Bilateral lower extremity venous duplex completed.    Preliminary report:  Inconclusive study due to body habitus and condition of the legs. Areas imaged show no obvious evidence of DVT. Compressions coulD not be obtained.  Kariya Lavergne, [place], RVS 08/16/2016, 4:47 PM

## 2016-08-16 NOTE — Consult Note (Signed)
[facility] Nurse wound consult note Reason for Consult: Cellulitis RLE with weeping Wound type: Partial thickness wound secondary to cellulitis and venous stasis Pressure Ulcer POA: NA Measurement: circumference of leg from ankle to knee Wound bed: Patchy partial thickness wounds with scabbing,hyperkeratosis, ruddy red with yellow plaques throughout Drainage (amount, consistency, odor) copious clear, thin drainage, no odor Periwound: dry, cracked skin Dressing procedure/placement/frequency: Wash with warm water and soap. Apply thick layer of Eucerin cream daily. Rest leg on blue chuck and change prn dampness.  [facility] Nurse wound consult note Reason for Consult: Hyperkeratosis to LLE Wound type: Chronic skin changes due to venous stasis Pressure Ulcer POA: NA Measurement: circumference of leg from foot to knee Wound bed: Dry, cracked, dusky skin Drainage (amount, consistency, odor) no drainage Periwound: same Dressing procedure/placement/frequency: Wash with warm soap and water daily and apply Eucerin cream to entire lower leg.  [facility] Nurse wound consult note Reason for Consult: Chronic skin changes to pannas Wound type: Intertriginous dermatitis Pressure Ulcer POA: NA Measurement: entire length of pannas Wound bed: red, denuded Drainage (amount, consistency, odor) moist, malodorous Periwound: chronic skin changes, dry, hyperkeratosis, yellow plaque to pannas Dressing procedure/placement/frequency: Wash with soap and water. Pat dry carefully.  Place interdry between skin folds of pannas with 2 inches of fabric free on both sides.  Discussed POC with patient and bedside nurse.  Will follow up next week. Thank you, Dorna Bloom BSN, RN, [facility]

## 2016-08-16 NOTE — ED Notes (Signed)
Patient transported to X-ray 

## 2016-08-16 NOTE — ED Notes (Signed)
Attempted report x 1; name and call back number provided 

## 2016-08-16 NOTE — ED Triage Notes (Signed)
Pt arrived to ED via EMS for possible infection of left leg. Cellulitis in left leg. On antibiotics for over 3 weeks for issue with leg. Leg swollen, red, fluid present. Odor present. Primary Dr. Recommended having leg looked at. Pain reported in knees from previous fall. Also reports bumps present on lower body.

## 2016-08-16 NOTE — Care Management Note (Addendum)
Case Management Note  Patient Details  Name: Kathleen Roy MRN: [id number] Date of Birth: January 30, 1961  Subjective/Objective:           Pt  presents with RLE redness and pain. PMH is significant for morbid obesity, depression, HTN, panic attacks, HLD, chronic pain, OSA, obesity hypoventilation syndrome (4L at home), chronic diastolic HF, DM2, venous stasis. Uses a cane with ambulation. Independent with ADL's.   PCP: Hyman Bible  Action/Plan: Return to home when medically stable. CM to f/u with disposition needs.  Expected Discharge Date:                  Expected Discharge Plan:  [place] (Resides with brother, Kathleen Roy)  In-House Referral:     Discharge planning Services  CM Consult  Post Acute Care Choice:    Choice offered to:  Patient (pt states has used [facility], but would like to use [facility]  once d/c if needed)  DME Arranged:   (owns cane), home oxygen DME Agency:   [facility]( oxygen)  HH Arranged:    [place]:  [place]  Status of Service:  In process, will continue to follow  If discussed at Long Length of Stay Meetings, dates discussed:    Additional Comments:  Sharin Mons, RN 08/16/2016, 3:04 PM

## 2016-08-16 NOTE — Progress Notes (Signed)
  FPTS Interim Progress Note  S: doing well since arriving on floor, was able to eat some breakfast. Denies pain in legs. Is worried because she was told in ED she may have to have leg amputated. Denies fevers, chills, nausea, vomiting. Her brother is her caretaker at home.   O: BP 129/99 (BP Location: Right Arm)   Pulse 101   Temp 97.3 F (36.3 C) (Oral)   Resp 18   SpO2 93%   General: morbidly obese, in no acute distress with non-toxic appearance HEENT: normocephalic, atraumatic, moist mucous membranes, nasal cannula in place CV: distant heart sounds, regular rate and rhythm without murmurs rubs or gallops Lungs: clear to auscultation bilaterally with normal work of breathing Abdomen: obese abdomen, non-tender, soft  Skin:  skin changes from chronic venous stasis, flaking skin on legs bilaterally, RLE erythematous Extremities: edematous bilaterally in lower extremities, no increased warmth, unable to palpate pulses in lower extremities, doppler detected dorsalis pedis and posterior tibialis pulses bilaterally   A/P:  Cellulitis of RLE -continue vanc and zosyn -blood cultures pending -ordered bilateral LE dopplers to r/o DVT  Abnormal EKG - EKG at time of admission demonstrating prolonged QRS, RBBB - afternoon EKG ordered - trend troponins   Chronic Venous Stasis -wound care consulted, will follow their recs -wash w/ soap and water, apply eucerin cream to both legs liberally -wash pannus w/ soap and water, apply interdry to folds of skin w/ extra fabric free on both sides -if negative for DVT may consider unna boot for leg compression  DM -CBG's ACHS -sensitive SSI, sugars well controlled so far with this  HTN -holding home meds due to low bp's  Peripheral neuropathy -continue home lyrica   Chronic Pain - Flexeril TID PRN - Percocet q 6 hr PRN   Bilateral Knee Pain after recent fall - x-rays of bilateral knees without acute process. - Percocet q 6 PRN   -  consider PT consult   HLD - pravastatin  Mood Disorder/Insomnia - home Trazodone  - home Venlafaxine  - Abilify (takes every other day)   FEN/GI: heart healthy carb modified Prophylaxis: Lovenox  Steve Rattler, DO 08/16/2016, 2:25 PM PGY-1, [place] Medicine Service pager [phone]

## 2016-08-16 NOTE — ED Notes (Signed)
Breakfast ordered 

## 2016-08-17 DIAGNOSIS — E119 Type 2 diabetes mellitus without complications: Secondary | ICD-10-CM

## 2016-08-17 DIAGNOSIS — B957 Other staphylococcus as the cause of diseases classified elsewhere: Secondary | ICD-10-CM

## 2016-08-17 DIAGNOSIS — E1142 Type 2 diabetes mellitus with diabetic polyneuropathy: Secondary | ICD-10-CM

## 2016-08-17 DIAGNOSIS — R7881 Bacteremia: Secondary | ICD-10-CM

## 2016-08-17 DIAGNOSIS — G6289 Other specified polyneuropathies: Secondary | ICD-10-CM

## 2016-08-17 DIAGNOSIS — I8311 Varicose veins of right lower extremity with inflammation: Secondary | ICD-10-CM

## 2016-08-17 DIAGNOSIS — L03115 Cellulitis of right lower limb: Principal | ICD-10-CM

## 2016-08-17 DIAGNOSIS — I8312 Varicose veins of left lower extremity with inflammation: Secondary | ICD-10-CM

## 2016-08-17 LAB — BLOOD CULTURE ID PANEL (REFLEXED)
Acinetobacter baumannii: NOT DETECTED
CANDIDA ALBICANS: NOT DETECTED
CANDIDA GLABRATA: NOT DETECTED
CANDIDA KRUSEI: NOT DETECTED
CANDIDA PARAPSILOSIS: NOT DETECTED
CANDIDA TROPICALIS: NOT DETECTED
Carbapenem resistance: NOT DETECTED
ENTEROBACTER CLOACAE COMPLEX: NOT DETECTED
ESCHERICHIA COLI: NOT DETECTED
Enterobacteriaceae species: NOT DETECTED
Enterococcus species: NOT DETECTED
Haemophilus influenzae: NOT DETECTED
KLEBSIELLA OXYTOCA: NOT DETECTED
KLEBSIELLA PNEUMONIAE: NOT DETECTED
LISTERIA MONOCYTOGENES: NOT DETECTED
METHICILLIN RESISTANCE: DETECTED — AB
Neisseria meningitidis: NOT DETECTED
PROTEUS SPECIES: NOT DETECTED
Pseudomonas aeruginosa: NOT DETECTED
SERRATIA MARCESCENS: NOT DETECTED
STREPTOCOCCUS PNEUMONIAE: NOT DETECTED
Staphylococcus aureus (BCID): NOT DETECTED
Staphylococcus species: DETECTED — AB
Streptococcus agalactiae: NOT DETECTED
Streptococcus pyogenes: NOT DETECTED
Streptococcus species: NOT DETECTED
Vancomycin resistance: NOT DETECTED

## 2016-08-17 LAB — CBC
HCT: 39.3 % (ref 36.0–46.0)
HEMOGLOBIN: 11.9 g/dL — AB (ref 12.0–15.0)
MCH: 29.3 pg (ref 26.0–34.0)
MCHC: 30.3 g/dL (ref 30.0–36.0)
MCV: 96.8 fL (ref 78.0–100.0)
Platelets: 211 10*3/uL (ref 150–400)
RBC: 4.06 MIL/uL (ref 3.87–5.11)
RDW: 16 % — ABNORMAL HIGH (ref 11.5–15.5)
WBC: 6.7 10*3/uL (ref 4.0–10.5)

## 2016-08-17 LAB — BASIC METABOLIC PANEL
ANION GAP: 10 (ref 5–15)
BUN: 15 mg/dL (ref 6–20)
CALCIUM: 9.6 mg/dL (ref 8.9–10.3)
CO2: 34 mmol/L — ABNORMAL HIGH (ref 22–32)
Chloride: 94 mmol/L — ABNORMAL LOW (ref 101–111)
Creatinine, Ser: 0.87 mg/dL (ref 0.44–1.00)
GLUCOSE: 146 mg/dL — AB (ref 65–99)
Potassium: 3.2 mmol/L — ABNORMAL LOW (ref 3.5–5.1)
SODIUM: 138 mmol/L (ref 135–145)

## 2016-08-17 LAB — GLUCOSE, CAPILLARY
GLUCOSE-CAPILLARY: 164 mg/dL — AB (ref 65–99)
GLUCOSE-CAPILLARY: 117 mg/dL — AB (ref 65–99)
GLUCOSE-CAPILLARY: 118 mg/dL — AB (ref 65–99)
GLUCOSE-CAPILLARY: 129 mg/dL — AB (ref 65–99)

## 2016-08-17 LAB — TROPONIN I

## 2016-08-17 MED ORDER — OXYCODONE HCL 5 MG PO TABS
5.0000 mg | ORAL_TABLET | Freq: Once | ORAL | Status: AC
  Administered 2016-08-17: 5 mg via ORAL
  Filled 2016-08-17: qty 1

## 2016-08-17 MED ORDER — POTASSIUM CHLORIDE CRYS ER 20 MEQ PO TBCR
40.0000 meq | EXTENDED_RELEASE_TABLET | Freq: Once | ORAL | Status: AC
  Administered 2016-08-17: 40 meq via ORAL
  Filled 2016-08-17: qty 2

## 2016-08-17 MED ORDER — FUROSEMIDE 10 MG/ML IJ SOLN
40.0000 mg | Freq: Two times a day (BID) | INTRAMUSCULAR | Status: DC
  Administered 2016-08-17 – 2016-08-18 (×3): 40 mg via INTRAVENOUS
  Filled 2016-08-17 (×2): qty 4

## 2016-08-17 MED ORDER — POTASSIUM CHLORIDE CRYS ER 20 MEQ PO TBCR: 40.0000 meq | EXTENDED_RELEASE_TABLET | Freq: Two times a day (BID) | ORAL | Status: DC

## 2016-08-17 NOTE — Progress Notes (Signed)
PHARMACY - PHYSICIAN COMMUNICATION CRITICAL VALUE ALERT - BLOOD CULTURE IDENTIFICATION (BCID)  Results for orders placed or performed during the hospital encounter of 08/16/16  Blood Culture ID Panel (Reflexed) (Collected: 08/16/2016  3:00 AM)  Result Value Ref Range   Enterococcus species NOT DETECTED NOT DETECTED   Vancomycin resistance NOT DETECTED NOT DETECTED   Listeria monocytogenes NOT DETECTED NOT DETECTED   Staphylococcus species DETECTED (A) NOT DETECTED   Staphylococcus aureus NOT DETECTED NOT DETECTED   Methicillin resistance DETECTED (A) NOT DETECTED   Streptococcus species NOT DETECTED NOT DETECTED   Streptococcus agalactiae NOT DETECTED NOT DETECTED   Streptococcus pneumoniae NOT DETECTED NOT DETECTED   Streptococcus pyogenes NOT DETECTED NOT DETECTED   Acinetobacter baumannii NOT DETECTED NOT DETECTED   Enterobacteriaceae species NOT DETECTED NOT DETECTED   Enterobacter cloacae complex NOT DETECTED NOT DETECTED   Escherichia coli NOT DETECTED NOT DETECTED   Klebsiella oxytoca NOT DETECTED NOT DETECTED   Klebsiella pneumoniae NOT DETECTED NOT DETECTED   Proteus species NOT DETECTED NOT DETECTED   Serratia marcescens NOT DETECTED NOT DETECTED   Carbapenem resistance NOT DETECTED NOT DETECTED   Haemophilus influenzae NOT DETECTED NOT DETECTED   Neisseria meningitidis NOT DETECTED NOT DETECTED   Pseudomonas aeruginosa NOT DETECTED NOT DETECTED   Candida albicans NOT DETECTED NOT DETECTED   Candida glabrata NOT DETECTED NOT DETECTED   Candida krusei NOT DETECTED NOT DETECTED   Candida parapsilosis NOT DETECTED NOT DETECTED   Candida tropicalis NOT DETECTED NOT DETECTED    Name of physician (or Provider) Contacted:  Dr. Juleen China  Changes to prescribed antibiotics required:   None at this time.  Consider discontinuing Zosyn  Caryl Pina 08/17/2016  6:42 AM

## 2016-08-17 NOTE — Progress Notes (Signed)
Pt stated she did not receive diuretics since admitted possibly due to low BP, presented with pitting edema. MD notified at 70.

## 2016-08-17 NOTE — Progress Notes (Signed)
Family Medicine Teaching Service Daily Progress Note Intern Pager: [phone]  [name] Medical record number: [id number] Date of birth: 1961/07/21 Age: 55 y.o. Gender: female  Primary Care Provider: Evette Doffing, MD Consultants: None Code Status: DNR  Pt Overview and Major Events to Date:  9/1: Admit to FPTS  Assessment and Plan: Kathleen Roy is a 55 y.o. female presenting with RLE redness and pain, noted to have cellulitis on RLE and bacteremia. PMH is significant for morbid obesity, depression, HTN, panic attacks, HLD, chronic pain, OSA, obesity hypoventilation syndrome (4L at home), chronic diastolic HF, DM2, venous stasis   Cellulitis of RLE: No leukocytosis. Lactic acid wnl. Afebrile. DVT ruled out. However, remains clinically impressive (images noted below) - Continue to monitor on telemetry - Vanc and Zosyn per pharmacy   Bacteremia: Gram + cocci in clusters on 2/2 blood cultures from 9/1. Staph Species, methicillin resistant. Not MRSA.  - Continue IV Vanc and Zosyn as above - Will consult ID - Complete Echo  Abnormal EKG: EKG at time of admission demonstrating prolonged QRS, RBBB. Troponins negative x 3.  - Repeat EKG ordered  Chronic Venous Stasis:  - wound care consult  DM:  Last A1c 6.3 in 05/2016. Takes Glipizide PRN (when cbg > 100). Glucose stable.  - CBGs ACHS  - sensitive SSI  HTN: Normotensive - holding home Triamtereme-HCTZ - Resume home Lasix  Peripheral Neuropathy: - home Lyrica  Chronic Pain: - Flexeril TID PRN - Percocet q 6 hr PRN   Bilateral Knee Pain after recent fall: x-rays of bilateral knees without acute process. - Percocet q 6 PRN   - consider PT consult   HLD:  - pravastatin  Mood Disorder/Insomnia:  - home Trazodone  - home Venlafaxine  - Abilify (takes every other day)   Electrolyte abnormalities: Hyperkalemia with K of 3.1 yesterday. Hypochloremia with Cl of 87 yesterday. S/p 20 meq of KDUR on 9/1 -  Repeat BMP today   FEN/GI: heart healthy carb modified Prophylaxis: Lovenox  Disposition: Continue to monitor of FPTS  Subjective:  Patient has no complaints today. No acute events overnight. Does not endorse any fever chills or pain.  Objective: Temp:  [98 F (36.7 C)-98.7 F (37.1 C)] 98 F (36.7 C) (09/02 0525) Pulse Rate:  [89-93] 93 (09/02 0525) Resp:  [18] 18 (09/02 0525) BP: (116-128)/(63-84) 116/84 (09/02 0525) SpO2:  [90 %-95 %] 90 % (09/02 0525) Physical Exam: General: morbidly obese, in no acute distress with non-toxic appearance HEENT: normocephalic, atraumatic, moist mucous membranes, nasal cannula in place CV: distant heart sounds, regular rate and rhythm without murmurs rubs or gallops Lungs: clear to auscultation bilaterally with normal work of breathing Abdomen: obese abdomen, non-tender, soft  Skin:  skin changes from chronic venous stasis, flaking skin on legs bilaterally, RLE erythematous  Laboratory:  Recent Labs Lab 08/16/16 0306  WBC 7.6  HGB 12.4  HCT 40.8  PLT 218    Recent Labs Lab 08/16/16 0306  NA 138  K 3.1*  CL 87*  CO2 39*  BUN 19  CREATININE 1.03*  CALCIUM 9.8  PROT 6.7  BILITOT 0.2*  ALKPHOS 72  ALT 11*  AST 14*  GLUCOSE 171*     Imaging/Diagnostic Tests: No results found.   Kathleen Dolly, MD 08/17/2016, 10:15 AM PGY-2, [place] Intern pager: [phone], text pages welcome

## 2016-08-17 NOTE — Consult Note (Signed)
[place] for Infectious Disease       Reason for Consult: purulent cellulitis    Referring Physician: Dr. McDiarmid  Principal Problem:   Cellulitis of right lower extremity Active Problems:   Morbid obesity ([place])   Peripheral neuropathy (HCC)   Venous stasis dermatitis of both lower extremities   Obesity hypoventilation syndrome (HCC)   Bilateral knee pain   Type 2 diabetes mellitus ([place])   Bacteremia   . ARIPiprazole  2 mg Oral QODAY  . enoxaparin (LOVENOX) injection  40 mg Subcutaneous Q24H  . furosemide  40 mg Intravenous BID  . hydrocerin   Topical Daily  . insulin aspart  0-9 Units Subcutaneous TID WC  . nystatin   Topical BID  . polyethylene glycol  17 g Oral QHS  . potassium chloride  40 mEq Oral Once  . pravastatin  40 mg Oral QHS  . pregabalin  150 mg Oral BID  . sodium chloride  500 mL Intravenous Once  . traZODone  300 mg Oral QHS  . vancomycin  1,250 mg Intravenous Q12H  . venlafaxine XR  300 mg Oral Daily    Recommendations: Continue vancomycin  Options for treatment at discharge include oral linezolid, though she is on an SSRI so this would need to be discontinued; OR wait for susceptibility testing and use oral options listed (such as doxycycline, Bactrim); OR could consider a 1 time dose of oritavancin as an outpatient (not given inpatient) at short stay.  I would treat for about 2 weeks at discharge since she has poor blood flow (for 1 and 2).    D/c zosyn  Consider using the cellulitis order set in the future  Thanks for consult, call with questions.   Assessment: She has purulent MRSE cellulitis in the setting of DM, morbid obesity.  Antibiotics: Vancomycin and zosyn  HPI: Kathleen Roy is a 55 y.o. female with pmh as above presented with RLE redness and pain c/w cellulitis.  Has been having purulent drainage, now improving.  Previous history of cellulitis.  Admits to scab picking.  Last treated with doxycycline for cellulitis in  June.  Had been ongoing about 2 weeks.  No fever, no chills. Feels that is is better.  Keeps her leg elevated.  WBC wnl, afebrile.   Review of Systems:  Constitutional: negative for fevers, chills and anorexia Respiratory: negative for cough Gastrointestinal: negative for diarrhea All other systems reviewed and are negative   Past Medical History:  Diagnosis Date  . Arthritis    "I feel like it's everywhere" (11/08/2014)  . Cellulitis and abscess of lower extremity 08/16/2016   right leg  . Chronic lower back pain   . COPD (chronic obstructive pulmonary disease) ([place])   . Depression   . Diabetes mellitus without complication ([place])    type 2   . Fibromyalgia   . Headache    "at least weekly; it's usually when I first wake up" (11/08/2014)  . Hyperlipidemia   . Hypertension   . Memory disturbance   . Migraines 1970's - <2000   "they just went away"  . Neuropathy ([place])   . Normal echocardiogram 05/30/05   suboptimal study  . Obesity, morbid (more than 100 lbs over ideal weight or BMI > 40) (HCC)    obese since childhood  . Osteoarthritis   . Peripheral edema   . Pressure ulcer of foot    left  . Sepsis ([place])   . Shortness of breath dyspnea   .  Sleep apnea    "recently dx'd; haven't got my equipment yet" (11/08/2014)    Social History  Substance Use Topics  . Smoking status: Former Smoker    Packs/day: 1.00    Years: 30.00    Types: Cigarettes    Quit date: 01/05/2009  . Smokeless tobacco: Never Used  . Alcohol use No    Family History  Problem Relation Age of Onset  . Stroke Mother   . Dementia Father   . Prostate cancer Father   . Congestive Heart Failure Brother   . Diabetes Brother   . Hypertension Brother     Allergies  Allergen Reactions  . Aspirin Hives    REACTION: hives Hasn't had in years     Physical Exam: Constitutional: in no apparent distress  Vitals:   08/16/16 2104 08/17/16 0525  BP: 128/63 116/84  Pulse: 89 93  Resp: 18 18  Temp:  98.7 F (37.1 C) 98 F (36.7 C)   EYES: anicteric ENMT: no thrush Cardiovascular: Cor RRR Respiratory: CTA B; normal respiratory effort GI: Bowel sounds are normal; morbidly obese Musculoskeletal: right leg with scaly appearance, red, mild warmth Skin: no rashes otherwise Hematologic: no cervical lad  Lab Results  Component Value Date   WBC 6.7 08/17/2016   HGB 11.9 (L) 08/17/2016   HCT 39.3 08/17/2016   MCV 96.8 08/17/2016   PLT 211 08/17/2016    Lab Results  Component Value Date   CREATININE 0.87 08/17/2016   BUN 15 08/17/2016   NA 138 08/17/2016   K 3.2 (L) 08/17/2016   CL 94 (L) 08/17/2016   CO2 34 (H) 08/17/2016    Lab Results  Component Value Date   ALT 11 (L) 08/16/2016   AST 14 (L) 08/16/2016   ALKPHOS 72 08/16/2016     Microbiology: Recent Results (from the past 240 hour(s))  Culture, blood (Routine X 2) w Reflex to ID Panel     Status: None (Preliminary result)   Collection Time: 08/16/16  3:00 AM  Result Value Ref Range Status   Specimen Description BLOOD RIGHT ARM  Final   Special Requests AEROBIC BOTTLE ONLY 10ML  Final   Culture  Setup Time   Final    GRAM POSITIVE COCCI IN CLUSTERS AEROBIC BOTTLE ONLY CRITICAL RESULT CALLED TO, READ BACK BY AND VERIFIED WITH: GREG ABBOTT,PHARMD @0450  08/17/16 MKELLY    Culture TOO YOUNG TO READ  Final   Report Status PENDING  Incomplete  Blood Culture ID Panel (Reflexed)     Status: Abnormal   Collection Time: 08/16/16  3:00 AM  Result Value Ref Range Status   Enterococcus species NOT DETECTED NOT DETECTED Final   Vancomycin resistance NOT DETECTED NOT DETECTED Final   Listeria monocytogenes NOT DETECTED NOT DETECTED Final   Staphylococcus species DETECTED (A) NOT DETECTED Final    Comment: CRITICAL RESULT CALLED TO, READ BACK BY AND VERIFIED WITH: GREG ABBOTT,PHARMD @0450  08/17/16 MKELLY    Staphylococcus aureus NOT DETECTED NOT DETECTED Final   Methicillin resistance DETECTED (A) NOT DETECTED Final     Comment: CRITICAL RESULT CALLED TO, READ BACK BY AND VERIFIED WITH: GREG ABBOTT,PHARMD @0450  08/17/16 MKELLY    Streptococcus species NOT DETECTED NOT DETECTED Final   Streptococcus agalactiae NOT DETECTED NOT DETECTED Final   Streptococcus pneumoniae NOT DETECTED NOT DETECTED Final   Streptococcus pyogenes NOT DETECTED NOT DETECTED Final   Acinetobacter baumannii NOT DETECTED NOT DETECTED Final   Enterobacteriaceae species NOT DETECTED NOT DETECTED Final  Enterobacter cloacae complex NOT DETECTED NOT DETECTED Final   Escherichia coli NOT DETECTED NOT DETECTED Final   Klebsiella oxytoca NOT DETECTED NOT DETECTED Final   Klebsiella pneumoniae NOT DETECTED NOT DETECTED Final   Proteus species NOT DETECTED NOT DETECTED Final   Serratia marcescens NOT DETECTED NOT DETECTED Final   Carbapenem resistance NOT DETECTED NOT DETECTED Final   Haemophilus influenzae NOT DETECTED NOT DETECTED Final   Neisseria meningitidis NOT DETECTED NOT DETECTED Final   Pseudomonas aeruginosa NOT DETECTED NOT DETECTED Final   Candida albicans NOT DETECTED NOT DETECTED Final   Candida glabrata NOT DETECTED NOT DETECTED Final   Candida krusei NOT DETECTED NOT DETECTED Final   Candida parapsilosis NOT DETECTED NOT DETECTED Final   Candida tropicalis NOT DETECTED NOT DETECTED Final  Culture, blood (Routine X 2) w Reflex to ID Panel     Status: None (Preliminary result)   Collection Time: 08/16/16  3:06 AM  Result Value Ref Range Status   Specimen Description BLOOD LEFT ARM  Final   Special Requests AEROBIC BOTTLE ONLY 10ML  Final   Culture  Setup Time   Final    GRAM POSITIVE COCCI IN CLUSTERS AEROBIC BOTTLE ONLY CRITICAL VALUE NOTED.  VALUE IS CONSISTENT WITH PREVIOUSLY REPORTED AND CALLED VALUE.    Culture PENDING  Incomplete   Report Status PENDING  Incomplete    Crystie Yanko, Herbie [place], [facility] for Infectious Disease [place] [email or web address] [phone] pager  [phone]  cell 08/17/2016, 3:01 PM

## 2016-08-18 DIAGNOSIS — I5032 Chronic diastolic (congestive) heart failure: Secondary | ICD-10-CM

## 2016-08-18 LAB — CBC
HCT: 36.3 % (ref 36.0–46.0)
HEMOGLOBIN: 11 g/dL — AB (ref 12.0–15.0)
MCH: 29.5 pg (ref 26.0–34.0)
MCHC: 30.3 g/dL (ref 30.0–36.0)
MCV: 97.3 fL (ref 78.0–100.0)
Platelets: 200 10*3/uL (ref 150–400)
RBC: 3.73 MIL/uL — ABNORMAL LOW (ref 3.87–5.11)
RDW: 16 % — AB (ref 11.5–15.5)
WBC: 6.9 10*3/uL (ref 4.0–10.5)

## 2016-08-18 LAB — BASIC METABOLIC PANEL
Anion gap: 7 (ref 5–15)
BUN: 15 mg/dL (ref 6–20)
CO2: 37 mmol/L — ABNORMAL HIGH (ref 22–32)
CREATININE: 0.89 mg/dL (ref 0.44–1.00)
Calcium: 9 mg/dL (ref 8.9–10.3)
Chloride: 96 mmol/L — ABNORMAL LOW (ref 101–111)
Glucose, Bld: 132 mg/dL — ABNORMAL HIGH (ref 65–99)
POTASSIUM: 3.3 mmol/L — AB (ref 3.5–5.1)
SODIUM: 140 mmol/L (ref 135–145)

## 2016-08-18 LAB — GLUCOSE, CAPILLARY
GLUCOSE-CAPILLARY: 117 mg/dL — AB (ref 65–99)
Glucose-Capillary: 116 mg/dL — ABNORMAL HIGH (ref 65–99)
Glucose-Capillary: 113 mg/dL — ABNORMAL HIGH (ref 65–99)
Glucose-Capillary: 134 mg/dL — ABNORMAL HIGH (ref 65–99)

## 2016-08-18 MED ORDER — FUROSEMIDE 10 MG/ML IJ SOLN
80.0000 mg | Freq: Two times a day (BID) | INTRAMUSCULAR | Status: DC
  Administered 2016-08-18 – 2016-08-19 (×2): 80 mg via INTRAVENOUS
  Filled 2016-08-18 (×3): qty 8

## 2016-08-18 MED ORDER — POTASSIUM CHLORIDE CRYS ER 20 MEQ PO TBCR
20.0000 meq | EXTENDED_RELEASE_TABLET | Freq: Three times a day (TID) | ORAL | Status: DC
Start: 2016-08-18 — End: 2016-08-19
  Administered 2016-08-18 (×3): 20 meq via ORAL
  Filled 2016-08-18 (×2): qty 1

## 2016-08-18 NOTE — Progress Notes (Signed)
Family Medicine Teaching Service Daily Progress Note Intern Pager: [phone]  [name] Medical record number: [id number] Date of birth: 1961/01/11 Age: 55 y.o. Gender: female  Primary Care Provider: Evette Doffing, MD Consultants: None Code Status: DNR  Pt Overview and Major Events to Date:  9/1: Admit to FPTS  Assessment and Plan: Kathleen Roy is a 55 y.o. female presenting with RLE cellulitis and bacteremia. PMH is significant for morbid obesity, depression, HTN, panic attacks, HLD, chronic pain, OSA, obesity hypoventilation syndrome (4L at home), chronic diastolic HF, DM2, venous stasis   Bacteremia: Gram + cocci in clusters on 2/2 blood cultures from 9/1. MRSE. Afebrile. Cardiac exam without murmurs. -Appreciate ID recs  - Continue Vanc . Lizenolid for D/c but need to D/c SSRI, or oritavancin as outpt one dose or wait on susceptibilities  - D/cd Zosyn per ID  - f/u C&S  Cellulitis of RLE: Patient with massive lipedema bilaterally.  No leukocytosis. Lactic acid wnl. Afebrile. DVT ruled out.  -Antibiotics as above -Continue to monitor on telemetry -PT/OT evaluation order placed  Abnormal EKG:  RBBB (old). Troponins negative x 3.    HFpEF: Last echo in 01/2016 with ejection fraction of 55-60% without functional or structural abnormalities. Patient with massive lipedema. On 4 L oxygen at home. Now without increased work of breathing or respiratory symptoms on room air. However, urine output 0.2 m per KG per hour over the last 24 hours. On torsemide 80 mg twice a day at home but has been getting 20 mg IV Lasix twice a day here -IV Lasix 80 mg twice a day -Daily BMP -K-Dur 20 milliequivalent 3 times a day  Chronic Venous Stasis/massive lipedema:  - wound care consult  Prediabetes: Last A1c 6.3 in 05/2016. Takes Glipizide PRN (when cbg > 100). Glucose stable.  - CBGs ACHS  - sensitive SSI  HTN: Normotensive - holding home Triamtereme-HCTZ - IV Lasix 80 mg twice  a day  Peripheral Neuropathy: - home Lyrica  Bilateral Knee Pain/OA and recent fall: x-rays of bilateral knees without acute process. She is on Percocet 10/325 3 times a day and Flexeril 10 mg 3 times a day at home. Patient is also on Lyrica 150 mg twice a day for neuropathy and trazodone. The combination of these along with his severe obesity may increase her risk of fall.  - Continue home pain medication for now - consider PT consult  - Recommend looking into her pain medications on discharge  HLD: Lipid panel in January 2017: Cholesterol 119, triglyceride 159, HDL 45 and LDL 42 - Continue home pravastatin  Mood Disorder/Insomnia: On trazodone, Effexor and Abilify at home -Continue home meds   Hypokalemia & Hypochloremia: improved. K 3.3. Cl 96. S/p 60 meq of KDUR so far - Kdur 20 mg three times given increased lasix to 80 mg twice a day  - Daily BMP  Severe obesity: She has cane at home. She likes to have walker.  FEN/GI: heart healthy carb modified  Prophylaxis: Lovenox  Disposition: Continue to monitor of FPTS  Subjective:  Patient has no complaints today. No acute events overnight. Stated that she was able to sleep well overnight. She is surprised that she is breathing well on room air. He says she is on 4 L at home. Does not endorse any fever chills or pain.  Objective: Temp:  [97.6 F (36.4 C)-98.6 F (37 C)] 98.4 F (36.9 C) (09/03 1312) Pulse Rate:  [82-98] 87 (09/03 1312) Resp:  [  18] 18 (09/03 1312) BP: (93-126)/(47-78) 126/53 (09/03 1312) SpO2:  [90 %-95 %] 93 % (09/03 1312) Weight:  [360 lb 9.6 oz (163.6 kg)] 360 lb 9.6 oz (163.6 kg) (09/02 1532) Physical Exam: General: morbidly obese, in no acute distress with non-toxic appearance CV: regular rate and rhythm without murmurs rubs or gallops Lungs: No increased work of breathing, on room air, clear to auscultation bilaterally Abdomen: obese abdomen, non-tender, soft  Skin:  skin changes from chronic  venous stasis, flaking skin on legs bilaterally, RLE erythematous, massive lipedema  Laboratory:  Recent Labs Lab 08/16/16 0306 08/17/16 1132 08/18/16 0537  WBC 7.6 6.7 6.9  HGB 12.4 11.9* 11.0*  HCT 40.8 39.3 36.3  PLT 218 211 200    Recent Labs Lab 08/16/16 0306 08/17/16 1132 08/18/16 0537  NA 138 138 140  K 3.1* 3.2* 3.3*  CL 87* 94* 96*  CO2 39* 34* 37*  BUN 19 15 15   CREATININE 1.03* 0.87 0.89  CALCIUM 9.8 9.6 9.0  PROT 6.7  --   --   BILITOT 0.2*  --   --   ALKPHOS 72  --   --   ALT 11*  --   --   AST 14*  --   --   GLUCOSE 171* 146* 132*     Imaging/Diagnostic Tests: No results found.   Mercy Riding, MD 08/18/2016, 1:15 PM PGY-2, [facility] Intern pager: [phone], text pages welcome

## 2016-08-19 ENCOUNTER — Other Ambulatory Visit (HOSPITAL_COMMUNITY): Payer: Medicare Other

## 2016-08-19 LAB — BASIC METABOLIC PANEL
Anion gap: 7 (ref 5–15)
BUN: 15 mg/dL (ref 6–20)
CHLORIDE: 95 mmol/L — AB (ref 101–111)
CO2: 38 mmol/L — ABNORMAL HIGH (ref 22–32)
CREATININE: 0.97 mg/dL (ref 0.44–1.00)
Calcium: 8.8 mg/dL — ABNORMAL LOW (ref 8.9–10.3)
GFR calc Af Amer: >60 mL/min (ref >60)
GFR calc non Af Amer: >60 mL/min (ref >60)
GLUCOSE: 136 mg/dL — AB (ref 65–99)
POTASSIUM: 2.9 mmol/L — AB (ref 3.5–5.1)
Sodium: 140 mmol/L (ref 135–145)

## 2016-08-19 LAB — CBC
HCT: 36.9 % (ref 36.0–46.0)
HEMOGLOBIN: 11 g/dL — AB (ref 12.0–15.0)
MCH: 29.1 pg (ref 26.0–34.0)
MCHC: 29.8 g/dL — AB (ref 30.0–36.0)
MCV: 97.6 fL (ref 78.0–100.0)
Platelets: 192 10*3/uL (ref 150–400)
RBC: 3.78 MIL/uL — AB (ref 3.87–5.11)
RDW: 16.1 % — ABNORMAL HIGH (ref 11.5–15.5)
WBC: 6.3 10*3/uL (ref 4.0–10.5)

## 2016-08-19 LAB — GLUCOSE, CAPILLARY
Glucose-Capillary: 115 mg/dL — ABNORMAL HIGH (ref 65–99)
Glucose-Capillary: 109 mg/dL — ABNORMAL HIGH (ref 65–99)
Glucose-Capillary: 114 mg/dL — ABNORMAL HIGH (ref 65–99)

## 2016-08-19 LAB — VANCOMYCIN, TROUGH: Vancomycin Tr: 31 ug/mL (ref 15–20)

## 2016-08-19 MED ORDER — FUROSEMIDE 10 MG/ML IJ SOLN
100.0000 mg | Freq: Two times a day (BID) | INTRAVENOUS | Status: DC
  Administered 2016-08-19 – 2016-08-20 (×3): 100 mg via INTRAVENOUS
  Filled 2016-08-19 (×5): qty 10

## 2016-08-19 MED ORDER — POTASSIUM CHLORIDE CRYS ER 20 MEQ PO TBCR
40.0000 meq | EXTENDED_RELEASE_TABLET | Freq: Every day | ORAL | Status: AC
  Administered 2016-08-19 (×4): 40 meq via ORAL
  Filled 2016-08-19 (×3): qty 2

## 2016-08-19 NOTE — Progress Notes (Signed)
CRITICAL VALUE ALERT  Critical value received:  Vancomycin trough level 31  Date of notification:  08/19/2016  Time of notification:  0602  Critical value read back:Yes.    Nurse who received alert:  Lubertha South  MD notified (1st page):  Riccio,MD ([place])  Time of first page:  [phone]  Responding MD:  Riccio,MD  Time MD responded:  [phone]  Riccio,MD notified. RN ordered to stop the running vancomycin by Riccio,MD. Order for Vancomycin medication was discontinued.

## 2016-08-19 NOTE — Clinical Social Work Note (Signed)
Clinical Social Work Assessment  Patient Details  Name: Kathleen Roy MRN: [id number] Date of Birth: Sep 16, 1961  Date of referral:  08/19/16               Reason for consult:  Facility Placement                Permission sought to share information with:  Facility Sport and exercise psychologist, Family Supports Permission granted to share information::  Yes, Verbal Permission Granted  Name::        Agency::  SNFs  Relationship::     Contact Information:     Housing/Transportation Living arrangements for the past 2 months:  Single Family Home Source of Information:  Patient Patient Interpreter Needed:  None Criminal Activity/Legal Involvement Pertinent to Current Situation/Hospitalization:  No - Comment as needed Significant Relationships:  Siblings, Pets Lives with:  Siblings, Pets Do you feel safe going back to the place where you live?  No Need for family participation in patient care:  No (Coment)  Care giving concerns:  CSW received referral for possible SNF placement at time of discharge. CSW met with patient regarding PT recommendation of SNF placement at time of discharge. Patient states she lives with her brother and he is currently unable to care for patient at their home given patient's current physical needs due to his own health issues. Patient states she does the household bills and patient's brother helps do the physical work like lifting her. Patient expressed understanding of PT recommendation and may be agreeable to SNF placement at time of discharge, but she misses home and her cat. CSW to continue to follow and assist with discharge planning needs.   Social Worker assessment / plan:  CSW spoke with patient concerning possibility of rehab at [facility] before returning home.  Employment status:  Disabled (Comment on whether or not currently receiving Disability) Insurance information:  Managed Medicare PT Recommendations:  [place] / Referral to community  resources:  [place]  Patient/Family's Response to care: Patient recognizes need for rehab before returning home and may be agreeable to a SNF in [place].   Patient/Family's Understanding of and Emotional Response to Diagnosis, Current Treatment, and Prognosis:  Patient is very worried about missing her cat at home, but is also worried that if she goes to SNF her brother can take care of some of his medical issues if she isn't there needing to be watched. No reported questions at this time.  Emotional Assessment Appearance:  Appears stated age Attitude/Demeanor/Rapport:  Other (Appropriate) Affect (typically observed):  Tearful/Crying, Appropriate, Accepting Orientation:  Oriented to Self, Oriented to Situation, Oriented to Place, Oriented to  Time Alcohol / Substance use:  Not Applicable Psych involvement (Current and /or in the community):  No (Comment)  Discharge Needs  Concerns to be addressed:  Care Coordination Readmission within the last 30 days:  No Current discharge risk:  None Barriers to Discharge:  Continued Medical Work up   [employer], [place] 08/19/2016, 2:44 PM

## 2016-08-19 NOTE — Progress Notes (Signed)
Physical Therapy Evaluation Patient Details Name: Kathleen Roy MRN: [id number] DOB: 03/07/1961 Today's Date: 08/19/2016   History of Present Illness  pt is a 55 y/o female with h/o cellulitis R LE, chronic Venous Stasis, DM, HTN, OSA and obesity hypoventilation syndrome, Peripheral Neuropathy, admitted with R LE redness and pain due to worsening cellulitis  Clinical Impression  Pt admitted with/for worsening R LE cellulitis.  Pt currently limited functionally due to the problems listed. ( See problems list.)   Pt will benefit from PT to maximize function and safety in order to get ready for next venue listed below.     Follow Up Recommendations SNF    Equipment Recommendations  Rolling walker with 5" wheels (heavier duty, but not wider,cue to not able to fit her home.)    Recommendations for Other Services       Precautions / Restrictions Precautions Precautions: Fall Restrictions Weight Bearing Restrictions: No      Mobility  Bed Mobility               General bed mobility comments: NT, up in the recliner on arrival  Transfers Overall transfer level: Needs assistance Equipment used: Rolling walker (2 wheeled) Transfers: Sit to/from Stand Sit to Stand: Min guard         General transfer comment: Used UE's and momentum to come forward and up without assist  Ambulation/Gait Ambulation/Gait assistance: Min guard Ambulation Distance (Feet): 25 Feet Assistive device: Rolling walker (2 wheeled) Gait Pattern/deviations: Step-through pattern Gait velocity: slow Gait velocity interpretation: Below normal speed for age/gender General Gait Details: short, low amplitude, waddling type gait.  Stairs            Wheelchair Mobility    Modified Rankin (Stroke Patients Only)       Balance Overall balance assessment: Needs assistance Sitting-balance support: No upper extremity supported Sitting balance-Leahy Scale: Fair     Standing balance support: Bilateral  upper extremity supported;Single extremity supported Standing balance-Leahy Scale: Poor Standing balance comment: reliant on UE's or assisttive device.                             Pertinent Vitals/Pain Pain Assessment: No/denies pain    Home Living Family/patient expects to be discharged to:: Private residence Living Arrangements: Other relatives Available Help at Discharge: Family;Available 24 hours/day Type of Home: House Home Access: Stairs to enter Entrance Stairs-Rails: Right Entrance Stairs-Number of Steps: 3 Home Layout: One level Home Equipment: Cane - single point;Bedside commode;Shower seat;Other (comment);Adaptive equipment Additional Comments: pt reports taking off O2 to ambulate to bathroom or other places around the small house.    Prior Function Level of Independence: Needs assistance   Gait / Transfers Assistance Needed: amb short distances with cane.  ADL's / Homemaking Assistance Needed: brother helped her with bathing and dressing and getting in tub/shower about once monthly, brother prepares convenience foods for pt  Comments: Pt can only leave her home with ambulance transportation.  Does not have a w/c and has applied for a ramp to be built.     Hand Dominance   Dominant Hand: Right    Extremity/Trunk Assessment   Upper Extremity Assessment: Generalized weakness           Lower Extremity Assessment: Defer to PT evaluation         Communication   Communication: No difficulties  Cognition Arousal/Alertness: Awake/alert Behavior During Therapy: WFL for tasks assessed/performed Overall Cognitive Status: Within  Functional Limits for tasks assessed                      General Comments General comments (skin integrity, edema, etc.): O2 sats dropped with ambulation on RA in room, to 85%. 4L O2 reapplied via [place] and sats back up to 90s. Patient reports she often takes her O2 off at home when she's ambulating/doing ADLs     Exercises        Assessment/Plan    PT Assessment Patient needs continued PT services  PT Diagnosis Difficulty walking;Generalized weakness (LE cellulitis)   PT Problem List Decreased strength;Decreased activity tolerance;Decreased mobility;Decreased knowledge of use of DME;Obesity;Pain  PT Treatment Interventions DME instruction;Gait training;Functional mobility training;Therapeutic activities;Balance training;Patient/family education   PT Goals (Current goals can be found in the Care Plan section) Acute Rehab PT Goals Patient Stated Goal: none stated PT Goal Formulation: With patient Time For Goal Achievement: 09/02/16 Potential to Achieve Goals: Fair    Frequency Min 3X/week   Barriers to discharge        Co-evaluation               End of Session   Activity Tolerance: Patient tolerated treatment well;Patient limited by fatigue Patient left: in chair;with call bell/phone within reach;with chair alarm set           Time: 1137-1209 PT Time Calculation (min) (ACUTE ONLY): 32 min   Charges:   PT Evaluation $PT Eval Moderate Complexity: 1 Procedure     PT G Codes:        Gricelda Foland, Tessie Fass 08/19/2016, 12:41 PM 08/19/2016  Donnella Sham, PT [phone]  (pager)

## 2016-08-19 NOTE — Progress Notes (Signed)
RN called pharmacy in order to question whether or not the 0600 dose of vancomycin was to be administered after the vanc trough was drawn or after the vanc trough results came back. RN spoke with Burman Nieves in the pharmacy. Maggie stated that the vancomycin could be administered to the patient right after the vanc trough lab was drawn. RN administered vancomycin to patient after phlebotomy drew the patient's vanc trough level. Will continue to monitor and treat per MD orders.

## 2016-08-19 NOTE — Progress Notes (Signed)
Pharmacy Antibiotic Note  Kathleen Roy is a 55 y.o. female admitted on 08/16/2016 with cellulitis.  Pharmacy has been consulted for Vancomycin dosing. Pt with progressive cellulitis that appears refractory to outpatient antibiotics, WBC WNL. Normalized CrCl is ~75 mL/min.   Vancomycin trough was supratherapeutic today at 31 ug/mL, with dose administered after the level was drawn. Renal function has appeared relatively stable, with slight increase in serum creatinine today.   Plan: -Hold vancomycin and get random level tomorrow at 0500 -Consider restarting dose of vancomycin 1250mg  IV q24h based on morning level -Trend WBC, temp, renal function -Drug levels at steady state as indicated -F/U length of therapy  Temp (24hrs), Avg:98.2 F (36.8 C), Min:98.1 F (36.7 C), Max:98.4 F (36.9 C)   Recent Labs Lab 08/16/16 0306 08/16/16 0321 08/17/16 1132 08/18/16 0537 08/19/16 0509  WBC 7.6  --  6.7 6.9 6.3  CREATININE 1.03*  --  0.87 0.89 0.97  LATICACIDVEN  --  1.68  --   --   --   VANCOTROUGH  --   --   --   --  31*    Estimated Creatinine Clearance: 100 mL/min (by C-G formula based on SCr of 0.97 mg/dL).    Allergies  Allergen Reactions  . Aspirin Hives    REACTION: hives Hasn't had in years    Antibiotics this Admission  Vanc 9/1 >> Zosyn 9/1 >>9/2  Dose adjustments this admission 9/4 VT: 31 @ 1250 q12h -> dose given after level drawn  Micro Labs this Admission  9/4 BCx x2: pending  9/1 BCx: BCID MRSE 2/2   Delane Ginger 08/19/2016 10:54 AM

## 2016-08-19 NOTE — Progress Notes (Signed)
Occupational Therapy Evaluation Patient Details Name: Kathleen Roy MRN: [id number] DOB: 05/09/1961 Today's Date: 08/19/2016    History of Present Illness pt is a 55 y/o female with h/o cellulitis R LE, chronic Venous Stasis, DM, HTN, OSA and obesity hypoventilation syndrome, Peripheral Neuropathy, admitted with R LE redness and pain due to worsening cellulitis   Clinical Impression   Patient presents to OT with decreased ADL independence and safety due to the deficits listed below. She will benefit from skilled OT to maximize function and to facilitate a safe discharge. OT will follow.    Follow Up Recommendations  SNF;Supervision/Assistance - 24 hour    Equipment Recommendations  Other (comment) (tbd at snf)    Recommendations for Other Services       Precautions / Restrictions Precautions Precautions: Fall Restrictions Weight Bearing Restrictions: No      Mobility Bed Mobility               General bed mobility comments: NT, up in the recliner on arrival  Transfers Overall transfer level: Needs assistance Equipment used: Rolling walker (2 wheeled) Transfers: Sit to/from Stand Sit to Stand: Min guard         General transfer comment: Used UE's and momentum to come forward and up without assist    Balance Overall balance assessment: Needs assistance Sitting-balance support: No upper extremity supported Sitting balance-Leahy Scale: Fair     Standing balance support: Bilateral upper extremity supported;Single extremity supported Standing balance-Leahy Scale: Poor Standing balance comment: reliant on UE's or assisttive device.                            ADL Overall ADL's : Needs assistance/impaired Eating/Feeding: Independent   Grooming: Wash/dry hands;Wash/dry face;Brushing hair;Set up               Lower Body Dressing: Total assistance Lower Body Dressing Details (indicate cue type and reason): don sock Toilet Transfer: Minimal  assistance;Ambulation;BSC;RW   Toileting- Clothing Manipulation and Hygiene: Total assistance;Sit to/from stand       Functional mobility during ADLs: Minimal assistance;Rolling walker       Vision     Perception     Praxis      Pertinent Vitals/Pain Pain Assessment: No/denies pain     Hand Dominance Right   Extremity/Trunk Assessment Upper Extremity Assessment Upper Extremity Assessment: Generalized weakness   Lower Extremity Assessment Lower Extremity Assessment: Defer to PT evaluation       Communication Communication Communication: No difficulties   Cognition Arousal/Alertness: Awake/alert Behavior During Therapy: WFL for tasks assessed/performed Overall Cognitive Status: Within Functional Limits for tasks assessed                     General Comments   RLE cellulitis, edema present BLEs and BUEs    Exercises       Shoulder Instructions      Home Living Family/patient expects to be discharged to:: Private residence Living Arrangements: Other relatives Available Help at Discharge: Family;Available 24 hours/day Type of Home: House Home Access: Stairs to enter [employer] of Steps: 3 Entrance Stairs-Rails: Right Home Layout: One level     Bathroom Shower/Tub: Teacher,  years/pre: Standard     Home Equipment: Cane - single point;Bedside commode;Shower seat;Other (comment);Adaptive equipment Adaptive Equipment: Reacher;Long-handled sponge Additional Comments: pt reports taking off O2 to ambulate to bathroom or other places around the small house.  Prior Functioning/Environment Level of Independence: Needs assistance  Gait / Transfers Assistance Needed: amb short distances with cane. ADL's / Homemaking Assistance Needed: brother helped her with bathing and dressing and getting in tub/shower about once monthly, brother prepares convenience foods for pt   Comments: Pt can only leave her home with ambulance  transportation.  Does not have a w/c and has applied for a ramp to be built.    OT Diagnosis: Generalized weakness   OT Problem List: Decreased strength;Decreased activity tolerance;Decreased knowledge of use of DME or AE;Obesity;Increased edema   OT Treatment/Interventions: Self-care/ADL training;Therapeutic exercise;DME and/or AE instruction;Therapeutic activities;Patient/family education    OT Goals(Current goals can be found in the care plan section) Acute Rehab OT Goals Patient Stated Goal: none stated OT Goal Formulation: With patient Time For Goal Achievement: 09/02/16 Potential to Achieve Goals: Good ADL Goals Pt Will Perform Upper Body Bathing: with set-up;sitting Pt Will Perform Lower Body Bathing: with adaptive equipment;sit to/from stand;with min assist Pt Will Perform Upper Body Dressing: with set-up;sitting Pt Will Perform Lower Body Dressing: with min assist;with adaptive equipment;sit to/from stand Pt Will Transfer to Toilet: with supervision;ambulating;bedside commode Pt Will Perform Toileting - Clothing Manipulation and hygiene: with supervision;with adaptive equipment;sit to/from stand  OT Frequency: Min 2X/week   Barriers to D/C:            Co-evaluation              End of Session Equipment Utilized During Treatment: Rolling walker;Oxygen Nurse Communication: Mobility status  Activity Tolerance: Patient tolerated treatment well Patient left: in chair;with call bell/phone within reach;with chair alarm set   Time: 1137-1209 OT Time Calculation (min): 32 min Charges:  OT General Charges $OT Visit: 1 Procedure OT Evaluation $OT Eval Low Complexity: 1 Procedure G-Codes:    Shigeru Lampert A 2016/08/23, 12:40 PM

## 2016-08-19 NOTE — NC FL2 (Signed)
[place] LEVEL OF CARE SCREENING TOOL     IDENTIFICATION  [name] Birthdate: 1961-06-26 Sex: female Admission Date (Current Location): 08/16/2016  [facility] and [place] Number:  Herbalist and Address:  [facility], [place] [address], [place], [place] [address]      Provider Number: [id number]  Attending Physician Name and Address:  Blane Ohara McDiarmid, MD  Relative Name and Phone Number:       Current Level of Care: Hospital Recommended Level of Care: [place] Prior Approval Number:    Date Approved/Denied:   PASRR Number:    Discharge Plan: SNF    Current Diagnoses: Patient Active Problem List   Diagnosis Date Noted  . Bacteremia   . Cellulitis of right lower extremity 06/04/2016  . Type 2 diabetes mellitus ([place]) 02/14/2016  . Pressure ulcer 01/16/2016  . Acute on chronic respiratory failure with hypercapnia ([place])   . Chronic diastolic congestive heart failure ([place])   . Essential hypertension   . CAP (community acquired pneumonia) 01/15/2016  . Bilateral knee pain 03/22/2015  . OSA (obstructive sleep apnea)   . Obesity hypoventilation syndrome ([place])   . Chronic respiratory failure with hypoxia ([place])   . Assistance needed for grooming 11/08/2014  . Fatigue 11/03/2014  . Memory loss 03/18/2014  . Tremor 03/18/2014  . Vulvovaginal candidiasis 11/25/2013  . Restrictive airway disease 08/26/2013  . Hypokalemia 08/19/2013  . Health care maintenance 06/17/2013  . Venous stasis dermatitis of both lower extremities 12/02/2012  . COPD (chronic obstructive pulmonary disease) ([place]) 10/15/2011  . Peripheral neuropathy ([place]) 10/15/2011  . KNEE PAIN, BILATERAL 01/22/2011  . BACK PAIN, LUMBAR, CHRONIC 01/22/2011  . MIGRAINE HEADACHE 10/25/2010  . HYPERLIPIDEMIA 04/07/2009  . EDEMA, CHRONIC 06/27/2008  . Morbid obesity ([place]) 11/16/2007  . DEPRESSION, MAJOR, RECURRENT 02/12/2007  . PANIC ATTACKS  02/12/2007  . HYPERTENSION, BENIGN SYSTEMIC 02/12/2007    Orientation RESPIRATION BLADDER Height & Weight     Self, Time, Situation, Place  O2 (Nasal cannula 4L) Continent Weight: (!) 163.6 kg (360 lb 9.6 oz) Height:     BEHAVIORAL SYMPTOMS/MOOD NEUROLOGICAL BOWEL NUTRITION STATUS      Continent Diet (Please see DC Summary)  AMBULATORY STATUS COMMUNICATION OF NEEDS Skin   Extensive Assist Verbally Normal                       Personal Care Assistance Level of Assistance  Bathing, Feeding, Dressing Bathing Assistance: Limited assistance Feeding assistance: Independent Dressing Assistance: Limited assistance     Functional Limitations Info             SPECIAL CARE FACTORS FREQUENCY  PT (By licensed PT), OT (By licensed OT)     PT Frequency: 5x/week OT Frequency: 3x/week            Contractures      Additional Factors Info  Code Status, Allergies, Psychotropic, Insulin Sliding Scale, Isolation Code Status Info: DNR Allergies Info: Aspirin Psychotropic Info: Effexor, Trazadone, Abilify Insulin Sliding Scale Info: insulin aspart (novoLOG) injection 0-9 Units  Isolation Info: Contact precautions       Current Medications (08/19/2016):  This is the current hospital active medication list Current Facility-Administered Medications  Medication Dose Route Frequency Provider Last Rate Last Dose  . ARIPiprazole (ABILIFY) tablet 2 mg  2 mg Oral QODAY Smiley Houseman, MD   2 mg at 08/19/16 0936  . cyclobenzaprine (FLEXERIL) tablet 10  mg  10 mg Oral TID PRN Smiley Houseman, MD      . enoxaparin (LOVENOX) injection 40 mg  40 mg Subcutaneous Q24H Smiley Houseman, MD   40 mg at 08/19/16 0800  . furosemide (LASIX) 100 mg in dextrose 5 % 50 mL IVPB  100 mg Intravenous BID Lupita Dawn, MD      . hydrocerin (EUCERIN) cream   Topical Daily Todd D McDiarmid, MD      . insulin aspart (novoLOG) injection 0-9 Units  0-9 Units Subcutaneous TID WC Smiley Houseman, MD    Stopped at 08/17/16 1200  . nystatin (MYCOSTATIN/NYSTOP) topical powder   Topical BID Smiley Houseman, MD      . oxyCODONE-acetaminophen (PERCOCET/ROXICET) 5-325 MG per tablet 1 tablet  1 tablet Oral Q6H PRN Blane Ohara McDiarmid, MD   1 tablet at 08/19/16 1412   And  . oxyCODONE (Oxy IR/ROXICODONE) immediate release tablet 5 mg  5 mg Oral Q6H PRN Blane Ohara McDiarmid, MD   5 mg at 08/19/16 1412  . polyethylene glycol (MIRALAX / GLYCOLAX) packet 17 g  17 g Oral QHS Smiley Houseman, MD   17 g at 08/18/16 2118  . potassium chloride SA (K-DUR,KLOR-CON) CR tablet 40 mEq  40 mEq Oral 5 X Daily Mercy Riding, MD   40 mEq at 08/19/16 0935  . pravastatin (PRAVACHOL) tablet 40 mg  40 mg Oral QHS Smiley Houseman, MD   40 mg at 08/18/16 2131  . pregabalin (LYRICA) capsule 150 mg  150 mg Oral BID Smiley Houseman, MD   150 mg at 08/19/16 0935  . sodium chloride 0.9 % bolus 500 mL  500 mL Intravenous Once Smiley Houseman, MD      . traZODone (DESYREL) tablet 300 mg  300 mg Oral QHS Smiley Houseman, MD   300 mg at 08/19/16 0014  . venlafaxine XR (EFFEXOR-XR) 24 hr capsule 300 mg  300 mg Oral Daily Smiley Houseman, MD   300 mg at 08/19/16 1412     Discharge Medications: Please see discharge summary for a list of discharge medications.  Relevant Imaging Results:  Relevant Lab Results:   Additional Information SSN: [place]  [place], [place]

## 2016-08-19 NOTE — Progress Notes (Signed)
Family Medicine Teaching Service Daily Progress Note Intern Pager: [phone]  [name] Medical record number: [id number] Date of birth: [birth year] Age: 55 y.o. Gender: female  Primary Care Provider: Evette Doffing, MD Consultants: None Code Status: DNR  Pt Overview and Major Events to Date:  9/1: Admit to FPTS  Assessment and Plan: Kathleen Roy is a 55 y.o. female presenting with RLE cellulitis and bacteremia. PMH is significant for morbid obesity, depression, HTN, panic attacks, HLD, chronic pain, OSA, obesity hypoventilation syndrome (4L at home), chronic diastolic HF, DM2, venous stasis   Bacteremia: 2/2 blood cultures from 9/1coag negative staph. Afebrile. Cardiac exam without murmurs. Vanc trough high at 31. . -Appreciate ID recs:   - See ID note for outpatient med options when able to go home.  - D/cd Zosyn  - f/u C&S -Holding vanc due to elevated vanc trough at 31. Pharmacy to order another vanc trough this afternoon  -Follow up repeat blood cultures  Cellulitis of RLE: Patient with massive lipedema bilaterally, and skin erythema over right LE to her knee. No leukocytosis. Lactic acid wnl. Afebrile. DVT ruled out.  -Antibiotics as above -Continue to monitor on telemetry -PT/OT evaluation order placed  Abnormal EKG:  RBBB (old). Troponins negative x 3.   HFpEF: Last echo in 01/2016 with EF of 55-60% without functional or structural abnormalities. On torsemide 80 mg twice a day at home. Started IV lasix 80 mg twice a day -Continue IV Lasix 80 mg twice a day -Daily BMP  Hypokalemia: K 2.9 this morning -increased K-Dur 40 mEq  x4 today  Chronic Venous Stasis/massive lipedema:  - wound care consult  Prediabetes: Last A1c 6.3 in 05/2016. Takes Glipizide PRN (when cbg > 100). Glucose stable.  - CBGs ACHS  - sensitive SSI  HTN: Normotensive - holding home Triamtereme-HCTZ - IV Lasix 80 mg twice a day  Peripheral Neuropathy: - home Lyrica  Bilateral  Knee Pain/OA and recent fall: x-rays of bilateral knees without acute process. She is on Percocet 10/325 3 times a day and Flexeril 10 mg 3 times a day at home. Patient is also on Lyrica 150 mg twice a day for neuropathy and trazodone. The combination of these along with his severe obesity may increase her risk of fall.  - Continue home pain medication for now - consider PT consult  - Recommend looking into her pain medications on discharge  HLD: Lipid panel in January 2017: Cholesterol 119, triglyceride 159, HDL 45 and LDL 42 - Continue home pravastatin  Mood Disorder/Insomnia: On trazodone, Effexor and Abilify at home -Continue home meds  Severe obesity: She has cane at home. She likes to have walker.  FEN/GI: heart healthy carb modified  Prophylaxis: Lovenox  Disposition: Continue inpatient for management of CHF and bacteremia.   Subjective:  Patient has no complaints today. No acute events overnight. Stated that she was able to sleep well overnight. She is surprised that she is breathing well on room air. He says she is on 4 L at home. Does not endorse any fever chills or pain.  Objective: Temp:  [98.1 F (36.7 C)-98.4 F (36.9 C)] 98.1 F (36.7 C) (09/04 0500) Pulse Rate:  [82-90] 90 (09/04 0500) Resp:  [18] 18 (09/04 0500) BP: (95-148)/(52-114) 148/114 (09/04 0500) SpO2:  [93 %-97 %] 94 % (09/04 0500) Physical Exam: General: lying flat in bed, sleepy but responds to question appropriately, morbidly obese CV: regular rate and rhythm without murmurs rubs or gallops Lungs:  mild increased work of breathing, on 4L by [place], lying flat in bed, difficult lung exam due to body habitus Abdomen: obese abdomen, non-tender, soft  Skin:  skin changes from chronic venous stasis, flaking skin on legs bilaterally, RLE erythematous, massive lipedema  Laboratory:  Recent Labs Lab 08/17/16 1132 08/18/16 0537 08/19/16 0509  WBC 6.7 6.9 6.3  HGB 11.9* 11.0* 11.0*  HCT 39.3 36.3 36.9   PLT 211 200 192    Recent Labs Lab 08/16/16 0306 08/17/16 1132 08/18/16 0537 08/19/16 0509  NA 138 138 140 140  K 3.1* 3.2* 3.3* 2.9*  CL 87* 94* 96* 95*  CO2 39* 34* 37* 38*  BUN 19 15 15 15   CREATININE 1.03* 0.87 0.89 0.97  CALCIUM 9.8 9.6 9.0 8.8*  PROT 6.7  --   --   --   BILITOT 0.2*  --   --   --   ALKPHOS 72  --   --   --   ALT 11*  --   --   --   AST 14*  --   --   --   GLUCOSE 171* 146* 132* 136*   Imaging/Diagnostic Tests: No results found.   Mercy Riding, MD 08/19/2016, 8:29 AM PGY-2, [place] Intern pager: [phone], text pages welcome

## 2016-08-20 ENCOUNTER — Encounter (HOSPITAL_BASED_OUTPATIENT_CLINIC_OR_DEPARTMENT_OTHER): Payer: Medicare Other | Attending: Surgery

## 2016-08-20 ENCOUNTER — Ambulatory Visit: Payer: Medicare Other | Admitting: Internal Medicine

## 2016-08-20 ENCOUNTER — Inpatient Hospital Stay (HOSPITAL_COMMUNITY): Payer: Medicare Other

## 2016-08-20 DIAGNOSIS — R7881 Bacteremia: Secondary | ICD-10-CM

## 2016-08-20 LAB — VANCOMYCIN, RANDOM: VANCOMYCIN RM: 12

## 2016-08-20 LAB — BASIC METABOLIC PANEL
ANION GAP: 9 (ref 5–15)
BUN: 14 mg/dL (ref 6–20)
CALCIUM: 8.7 mg/dL — AB (ref 8.9–10.3)
CO2: 34 mmol/L — AB (ref 22–32)
Chloride: 96 mmol/L — ABNORMAL LOW (ref 101–111)
Creatinine, Ser: 0.85 mg/dL (ref 0.44–1.00)
Glucose, Bld: 108 mg/dL — ABNORMAL HIGH (ref 65–99)
Potassium: 3.3 mmol/L — ABNORMAL LOW (ref 3.5–5.1)
SODIUM: 139 mmol/L (ref 135–145)

## 2016-08-20 LAB — CULTURE, BLOOD (ROUTINE X 2)

## 2016-08-20 LAB — CBC
HCT: 37.8 % (ref 36.0–46.0)
Hemoglobin: 11.3 g/dL — ABNORMAL LOW (ref 12.0–15.0)
MCH: 29.3 pg (ref 26.0–34.0)
MCHC: 29.9 g/dL — ABNORMAL LOW (ref 30.0–36.0)
MCV: 97.9 fL (ref 78.0–100.0)
PLATELETS: 169 10*3/uL (ref 150–400)
RBC: 3.86 MIL/uL — AB (ref 3.87–5.11)
RDW: 16 % — ABNORMAL HIGH (ref 11.5–15.5)
WBC: 6.1 10*3/uL (ref 4.0–10.5)

## 2016-08-20 LAB — GLUCOSE, CAPILLARY
GLUCOSE-CAPILLARY: 90 mg/dL (ref 65–99)
GLUCOSE-CAPILLARY: 149 mg/dL — AB (ref 65–99)
GLUCOSE-CAPILLARY: 119 mg/dL — AB (ref 65–99)
Glucose-Capillary: 118 mg/dL — ABNORMAL HIGH (ref 65–99)

## 2016-08-20 LAB — ECHOCARDIOGRAM COMPLETE: Weight: 5769.6 oz

## 2016-08-20 MED ORDER — POTASSIUM CHLORIDE CRYS ER 20 MEQ PO TBCR
40.0000 meq | EXTENDED_RELEASE_TABLET | Freq: Three times a day (TID) | ORAL | Status: DC
  Administered 2016-08-20 (×3): 40 meq via ORAL
  Filled 2016-08-20 (×4): qty 2

## 2016-08-20 MED ORDER — VANCOMYCIN HCL IN DEXTROSE 750-5 MG/150ML-% IV SOLN
750.0000 mg | Freq: Two times a day (BID) | INTRAVENOUS | Status: DC
  Administered 2016-08-20 – 2016-08-22 (×6): 750 mg via INTRAVENOUS
  Filled 2016-08-20 (×7): qty 150

## 2016-08-20 MED ORDER — OXYCODONE HCL 5 MG PO TABS
7.5000 mg | ORAL_TABLET | Freq: Four times a day (QID) | ORAL | Status: DC | PRN
  Administered 2016-08-20 – 2016-08-22 (×5): 7.5 mg via ORAL
  Filled 2016-08-20 (×5): qty 2

## 2016-08-20 MED ORDER — ACETAMINOPHEN 325 MG PO TABS
650.0000 mg | ORAL_TABLET | Freq: Four times a day (QID) | ORAL | Status: AC
  Administered 2016-08-20 – 2016-08-21 (×5): 650 mg via ORAL
  Filled 2016-08-20 (×6): qty 2

## 2016-08-20 MED ORDER — PERFLUTREN LIPID MICROSPHERE
1.0000 mL | INTRAVENOUS | Status: AC | PRN
  Administered 2016-08-20: 3 mL via INTRAVENOUS
  Filled 2016-08-20: qty 10

## 2016-08-20 MED ORDER — CAPSAICIN 0.025 % EX CREA
TOPICAL_CREAM | Freq: Two times a day (BID) | CUTANEOUS | Status: DC
  Administered 2016-08-21: 10:00:00 via TOPICAL
  Filled 2016-08-20: qty 56.6

## 2016-08-20 NOTE — Care Management Important Message (Signed)
Important Message  Patient Details  Name: Kathleen Roy MRN: [id number] Date of Birth: 07/21/1961   Medicare Important Message Given:  Yes    Detric Scalisi Abena 08/20/2016, 5:07 PM

## 2016-08-20 NOTE — Progress Notes (Signed)
Pharmacy Antibiotic Note  Kathleen Roy is a 55 y.o. female admitted on 08/16/2016 with MRSE bacteremia.   Day #5 of vancomycin for MRSE bactermia w/ severe nonpurulent RLE cellulitis. Considering switching to Linezolid (interaction with venlafaxine) vs. Oritavancin x 1 dose. Also might consider Bactrim or doxy PO if susceptible. ID recommending 2 weeks of treatment starting at discharge. Afebrile, WBC wnl. SCr stable, normalized CrCl ~66ml/min  Plan: Restart vancomycin at 750mg  IV Q12 Monitor clinical picture, renal function, VT at new Css F/U C&S, abx deescalation  Temp (24hrs), Avg:98.1 F (36.7 C), Min:97.8 F (36.6 C), Max:98.4 F (36.9 C)   Recent Labs Lab 08/16/16 0306 08/16/16 0321 08/17/16 1132 08/18/16 0537 08/19/16 0509 08/20/16 0754  WBC 7.6  --  6.7 6.9 6.3 6.1  CREATININE 1.03*  --  0.87 0.89 0.97 0.85  LATICACIDVEN  --  1.68  --   --   --   --   VANCOTROUGH  --   --   --   --  31*  --   VANCORANDOM  --   --   --   --   --  12    Estimated Creatinine Clearance: 114.1 mL/min (by C-G formula based on SCr of 0.85 mg/dL).    Allergies  Allergen Reactions  . Aspirin Hives    REACTION: hives Hasn't had in years    Kathleen Roy 08/20/2016 8:50 AM

## 2016-08-20 NOTE — Consult Note (Signed)
WOC follow-up: Refer to previous [facility] consult note on 9/1.  Bilat legs remain with generalized edema and venous stasis changes; pt states this is a chronic problem.  Beginning to dry and scab in patchy areas and pt states the cellulitis has improved since previous assessment was performed.  She had intertrigo in her abd pannus folds and has been using Interdry silver-impregnated fabric, she states previous maceration and raw areas are much more comfortable.  Skin is less moist and red when assessed and pt has a new piece of Interdry in place which was applied yesterday.  Continue present plan of care to BLE with Eucerin and Interdry fabric orders have been provided for the bedside nurses. Please re-consult if further assistance is needed.  Thank-you,  Julien Girt MSN, [place], [place], [place], [place]

## 2016-08-20 NOTE — Progress Notes (Signed)
Family Medicine Teaching Service Daily Progress Note Intern Pager: [phone]  [name] Medical record number: [id number] Date of birth: 05-28-1961 Age: 55 y.o. Gender: female  Primary Care Provider: Evette Doffing, MD Consultants: None Code Status: DNR  Pt Overview and Major Events to Date:  9/1: Admit to FPTS  Assessment and Plan: Kathleen Roy is a 55 y.o. female presenting with RLE cellulitis and bacteremia. PMH is significant for morbid obesity, depression, HTN, panic attacks, HLD, chronic pain, OSA, obesity hypoventilation syndrome (4L at home), chronic diastolic HF, DM2, venous stasis   Bacteremia: 2/2 blood cultures from 9/1coag negative staph. Afebrile. Cardiac exam without murmurs. Vanc trough high at 31 on 9/4, vanc was held that day. -per ID recs 9/5 via phone, because two blood cultures were positive for MRSE, recommend PICC and 2 weeks of IV vanc treatment -Continue Vanc per pharmacy, trough was elevated yesterday to 31. Repeat random vanc today 12, continued vancomycin today -Follow up repeat blood cultures pending from 9/4 -echo today to rule out valve vegetations  Cellulitis of RLE: Patient with massive lipedema bilaterally, and skin erythema over right LE to her knee. No leukocytosis. Lactic acid wnl. Afebrile. DVT ruled out.  -Antibiotics as above -Continue to monitor on telemetry  -PT/OT recommend SNF placement, need for 24 hour supervision -compress legs with ace wraps, keep legs elevated to help with edema  Abnormal EKG:  RBBB (old). Troponins negative x 3.   HFpEF: Last echo in 01/2016 with EF of 55-60% without functional or structural abnormalities. On torsemide 80 mg twice a day at home. Started IV lasix 80 mg twice a day then increased to 100mg  bid to aid with diuresis/edema. Out 1L over past 48 hours. -Continue IV Lasix 100 mg twice a day -Daily BMP, Cr today 0.85  Hypokalemia: K 2.9--> 3.3 this morning, will continue to replace  -continue  K-Dur 40 mEq  x3 today -repeat BMP tomorrow  Chronic Venous Stasis/massive lipedema:  - wound care consult  Prediabetes: Last A1c 6.3 in 05/2016. Takes Glipizide PRN (when cbg > 100). Glucose stable.  - CBGs ACHS  - sensitive SSI  HTN: Normotensive - holding home Triamtereme-HCTZ - Increased IV Lasix 100 mg twice a day  Peripheral Neuropathy: - home Lyrica  Bilateral Knee Pain/OA and recent fall: x-rays of bilateral knees without acute process. She is on Percocet 10/325 3 times a day and Flexeril 10 mg 3 times a day at home. Patient is also on Lyrica 150 mg twice a day for neuropathy and trazodone. The combination of these along with his severe obesity may increase her risk of fall.  - Will decrease pain medicine by 25% to 7.5 mg oxycodone, add scheduled tylenol for pain control -continue to wean down pain medications - consider PT consult    HLD: Lipid panel in January 2017: Cholesterol 119, triglyceride 159, HDL 45 and LDL 42 - Continue home pravastatin  Mood Disorder/Insomnia: On trazodone, Effexor and Abilify at home -Continue home meds  Severe obesity: She has cane at home. She likes to have walker.  FEN/GI: heart healthy carb modified  Prophylaxis: Lovenox  Disposition: Continue inpatient for management of CHF and bacteremia. Potentially discharge to SNF per PT recs  Subjective:  [name] is complaining of difficulty getting comfortable in her bed with pillow placement this morning. She had a hard time sleeping through the night due to discomfort. Discussed SNF w/ OT yesterday and she is considering it even though she would prefer  to go home, she acknowledges her brother has a hard time caring for her. Denies fevers, chills, nausea, vomiting. Has some right leg pain where cellulitis is. Normal bowel movements.   Objective: Temp:  [97.8 F (36.6 C)-98.4 F (36.9 C)] 98.4 F (36.9 C) (09/05 0505) Pulse Rate:  [78-80] 80 (09/05 0505) Resp:  [18] 18 (09/04  2043) BP: (106-115)/(58-62) 115/62 (09/05 0505) SpO2:  [84 %-94 %] 94 % (09/05 0505) Physical Exam: General: lying flat in bed, morbidly obese, in NAD CV: regular rate and rhythm without murmurs rubs or gallops Lungs: no increased work of breathing, difficult lung exam due to body habitus Abdomen: obese abdomen, non-tender, soft  Skin:  skin changes from chronic venous stasis, flaking skin is improving on legs bilaterally, RLE erythematous, massive lipedema. Some LLE edema on anterior shin.  Laboratory:  Recent Labs Lab 08/18/16 0537 08/19/16 0509 08/20/16 0754  WBC 6.9 6.3 6.1  HGB 11.0* 11.0* 11.3*  HCT 36.3 36.9 37.8  PLT 200 192 169    Recent Labs Lab 08/16/16 0306  08/18/16 0537 08/19/16 0509 08/20/16 0754  NA 138  < > 140 140 139  K 3.1*  < > 3.3* 2.9* 3.3*  CL 87*  < > 96* 95* 96*  CO2 39*  < > 37* 38* 34*  BUN 19  < > 15 15 14   CREATININE 1.03*  < > 0.89 0.97 0.85  CALCIUM 9.8  < > 9.0 8.8* 8.7*  PROT 6.7  --   --   --   --   BILITOT 0.2*  --   --   --   --   ALKPHOS 72  --   --   --   --   ALT 11*  --   --   --   --   AST 14*  --   --   --   --   GLUCOSE 171*  < > 132* 136* 108*  < > = values in this interval not displayed. Imaging/Diagnostic Tests: No results found.   Steve Rattler, DO 08/20/2016, 9:25 AM PGY-1, [place] Intern pager: [phone], text pages welcome

## 2016-08-20 NOTE — Progress Notes (Signed)
Echocardiogram 2D Echocardiogram has been performed with definity.  Kathleen Roy 08/20/2016, 3:34 PM

## 2016-08-20 NOTE — Clinical Social Work Note (Signed)
CSW provided bed offers. Pt chose [facility] and would like to speak to the liaison. CSW udpated [facility] Facility Liaison of choice. CSW will continue to follow.   Darden Dates, [name], [name]  Clinical Social Worker [phone]

## 2016-08-21 LAB — BASIC METABOLIC PANEL
Anion gap: 11 (ref 5–15)
BUN: 15 mg/dL (ref 6–20)
CALCIUM: 9 mg/dL (ref 8.9–10.3)
CO2: 33 mmol/L — ABNORMAL HIGH (ref 22–32)
Chloride: 96 mmol/L — ABNORMAL LOW (ref 101–111)
Creatinine, Ser: 0.9 mg/dL (ref 0.44–1.00)
GFR calc Af Amer: >60 mL/min (ref >60)
GLUCOSE: 109 mg/dL — AB (ref 65–99)
Potassium: 3.5 mmol/L (ref 3.5–5.1)
SODIUM: 140 mmol/L (ref 135–145)

## 2016-08-21 LAB — GLUCOSE, CAPILLARY
GLUCOSE-CAPILLARY: 105 mg/dL — AB (ref 65–99)
GLUCOSE-CAPILLARY: 86 mg/dL (ref 65–99)
Glucose-Capillary: 176 mg/dL — ABNORMAL HIGH (ref 65–99)
Glucose-Capillary: 91 mg/dL (ref 65–99)

## 2016-08-21 LAB — CBC
HCT: 37.8 % (ref 36.0–46.0)
Hemoglobin: 11.4 g/dL — ABNORMAL LOW (ref 12.0–15.0)
MCH: 28.9 pg (ref 26.0–34.0)
MCHC: 30.2 g/dL (ref 30.0–36.0)
MCV: 95.9 fL (ref 78.0–100.0)
PLATELETS: 190 10*3/uL (ref 150–400)
RBC: 3.94 MIL/uL (ref 3.87–5.11)
RDW: 15.8 % — AB (ref 11.5–15.5)
WBC: 6.6 10*3/uL (ref 4.0–10.5)

## 2016-08-21 MED ORDER — SODIUM CHLORIDE 0.9% FLUSH
10.0000 mL | INTRAVENOUS | Status: DC | PRN
  Administered 2016-08-22: 10 mL
  Filled 2016-08-21: qty 40

## 2016-08-21 MED ORDER — ENOXAPARIN SODIUM 40 MG/0.4ML ~~LOC~~ SOLN
40.0000 mg | Freq: Once | SUBCUTANEOUS | Status: AC
  Administered 2016-08-21: 40 mg via SUBCUTANEOUS
  Filled 2016-08-21: qty 0.4

## 2016-08-21 MED ORDER — ENOXAPARIN SODIUM 80 MG/0.8ML ~~LOC~~ SOLN
0.5000 mg/kg | SUBCUTANEOUS | Status: DC
  Administered 2016-08-22: 80 mg via SUBCUTANEOUS
  Filled 2016-08-21: qty 0.8

## 2016-08-21 MED ORDER — POTASSIUM CHLORIDE CRYS ER 20 MEQ PO TBCR
40.0000 meq | EXTENDED_RELEASE_TABLET | Freq: Two times a day (BID) | ORAL | Status: DC
  Administered 2016-08-21 (×2): 40 meq via ORAL
  Filled 2016-08-21: qty 2

## 2016-08-21 MED ORDER — TORSEMIDE 20 MG PO TABS
40.0000 mg | ORAL_TABLET | Freq: Two times a day (BID) | ORAL | Status: DC
  Administered 2016-08-21: 40 mg via ORAL
  Filled 2016-08-21: qty 2

## 2016-08-21 MED ORDER — TORSEMIDE 20 MG PO TABS
40.0000 mg | ORAL_TABLET | Freq: Once | ORAL | Status: DC
  Filled 2016-08-21: qty 2

## 2016-08-21 MED ORDER — TORSEMIDE 20 MG PO TABS
80.0000 mg | ORAL_TABLET | Freq: Every day | ORAL | Status: DC
  Administered 2016-08-22: 80 mg via ORAL
  Filled 2016-08-21: qty 4

## 2016-08-21 MED FILL — Perflutren Lipid Microsphere IV Susp 1.1 MG/ML: INTRAVENOUS | Qty: 10 | Status: AC

## 2016-08-21 NOTE — Progress Notes (Signed)
I came by to give Ms. Kathleen Roy a courtesy visit. She used to be my patient but was changed to Dr. Brett Albino given scheduling issues. She seems to be doing well in general, per patient, her cellulitis has improved some. She talked about her brother finally getting cardiac cath done while she stays in rehab for 3 weeks. This is exciting news,hopefully after his procedure, he will improve his cardiovascular status and will be able to provide support for Ms. Kathleen Roy. She is in good spirit. I appreciate care provided by Dr. Brett Albino and the inpatient team.

## 2016-08-21 NOTE — Consult Note (Signed)
[facility] Nurse wound consult note Reason for Consult: ? Papules posterior thigh Wound type:excoriation Pressure Ulcer POA: No Measurement:3cm x 1.cm x 0.0cm Wound bed: Drainage (amount, consistency, odor) none Periwound:small pink area Dressing procedure/placement/frequency: I have provided nurses with orders: To dry, scaled area beside right vulva in skin fold, Cleanse with NS, gently pat dry, apply Xeroform gauze, foam dsg for padding, change every shift. We will not follow, but will remain available to this patient, to nursing, and the medical teams.'    Fara Olden, RN-C, WTA-C Wound Treatment Associate

## 2016-08-21 NOTE — Progress Notes (Signed)
Family Medicine Teaching Service Daily Progress Note Intern Pager: [phone]  [name] Medical record number: [id number] Date of birth: Aug 16, 1961 Age: 55 y.o. Gender: female  Primary Care Provider: Evette Doffing, MD Consultants: wound care, ID Code Status: DNR  Pt Overview and Major Events to Date:  9/1: Admit to FPTS  Assessment and Plan: Kathleen Roy a 55 y.o.femalepresenting with RLE cellulitis and bacteremia. PMH is significant for morbid obesity, depression, HTN, panic attacks, HLD, chronic pain, OSA, obesity hypoventilation syndrome (4L at home), chronic diastolic HF, DM2, venous stasis   Bacteremia: 2/2 blood cultures from 9/1coag negative staph MRSE. Afebrile. Cardiac exam without murmurs. Vanc trough high at 31 on 9/4, vanc was held that day. Has been continued per pharm dosing. -per ID recs 9/5 via phone, because two blood cultures were positive for MRSE, recommend PICC and 2 weeks of IV vanc treatment -Continue Vanc per pharmacy dosing -repeat blood cultures negative at 48 hours -PICC ordered today -ECHO showed no signs of vegetations, per ID TEE is not necessary.  Cellulitis of RLE: Patient with massive lipedema bilaterally, and skin erythema over right LE to her knee. No leukocytosis. Lactic acid wnl. Afebrile. DVT ruled out.  -Antibiotics as above -follow wound care recs -Continue to monitor on telemetry  -PT/OT recommend SNF placement, need for 24 hour supervision -patient refused leg compression with ace wraps, will offer again today seems like she would reconsider  -keep legs elevated  Abnormal EKG:  RBBB (old). Troponins negative x 3. No chest pain. -monitor  HFpEF: Last echo in 01/2016 with EF of 55-60% without functional or structural abnormalities. On torsemide 80 mg twice a day at home. Started IV lasix 80 mg twice a day then increased to 100mg  bid to aid with diuresis/edema. Out 515mL over past 24 hours. -Discontinued IV lasix -  restart home torsemide 40 mg bid  - recheck weight today, was 360 on admission -Daily BMP, Cr today 0.90  Hypokalemia: K 2.9--> 3.3 --> 3.5 this morning, will continue to replace because she is still on high dose of Lasix -K-Dur 40 mEq  x2 today -follow BMP   Chronic Venous Stasis/massive lipedema: - wound care consult, follow recs - should keep legs elevated, ace wrap compression  Prediabetes:Last A1c 6.3 in 05/2016. TakesGlipizide PRN (when cbg >100). Glucose stable.  - CBGs ACHS  - sensitive SSI  [id number] - holding home Triamtereme-HCTZ - restarted home torsemide  Peripheral Neuropathy: - home Lyrica  Bilateral Knee Pain/OA and recent fall:x-rays of bilateral knees without acute process. She is on Percocet 10/325 3 times a day and Flexeril 10 mg 3 times a day at home. Patient is also on Lyrica 150 mg twice a day for neuropathy and trazodone. The combination of these along with his severe obesity may increase her risk of fall.  - 7.5 mg oxycodone as needed for pain - scheduled tylenol for pain control -continue to wean down pain medications - PT consult recommended SNF after discharge  HLD: Lipid panel in January 2017: Cholesterol 119, triglyceride 159, HDL 45 and LDL 42 - Continue home pravastatin  Mood Disorder/Insomnia: On trazodone, Effexor and Abilify at home -Continue home meds  Severe obesity: She has cane at home. She likes to have walker.  FEN/GI: heart healthy carb modified  Prophylaxis: Lovenox  Disposition: SNF   Subjective:  Kathleen Roy is feeling well today. Had trouble sleeping due to leg discomfort/pillow placement. Denies fevers, chills, nausea, vomiting, shortness of breath.  Objective: Temp:  [97.9 F (36.6 C)-98 F (36.7 C)] 98 F (36.7 C) (09/06 0551) Pulse Rate:  [76-87] 87 (09/06 0551) Resp:  [14-18] 18 (09/06 0551) BP: (108-146)/(49-126) 108/65 (09/06 0551) SpO2:  [95 %-96 %] 96 % (09/06 0551) Physical  Exam: General: lying comfortably with HOB elevated, morbidly obese, in NAD CV: difficult to auscultate due to body habitus, regular rate and rhythm without murmurs rubs or gallops Lungs: no increased work of breathing, difficult lung exam due to body habitus Abdomen: obese abdomen, non-tender, soft  Skin: skin changes from chronic venous stasis, RLE erythematous, massive lipedema. Some LLE erythema on anterior shin, no drainage  Laboratory:  Recent Labs Lab 08/18/16 0537 08/19/16 0509 08/20/16 0754  WBC 6.9 6.3 6.1  HGB 11.0* 11.0* 11.3*  HCT 36.3 36.9 37.8  PLT 200 192 169    Recent Labs Lab 08/16/16 0306  08/18/16 0537 08/19/16 0509 08/20/16 0754  NA 138  < > 140 140 139  K 3.1*  < > 3.3* 2.9* 3.3*  CL 87*  < > 96* 95* 96*  CO2 39*  < > 37* 38* 34*  BUN 19  < > 15 15 14   CREATININE 1.03*  < > 0.89 0.97 0.85  CALCIUM 9.8  < > 9.0 8.8* 8.7*  PROT 6.7  --   --   --   --   BILITOT 0.2*  --   --   --   --   ALKPHOS 72  --   --   --   --   ALT 11*  --   --   --   --   AST 14*  --   --   --   --   GLUCOSE 171*  < > 132* 136* 108*  < > = values in this interval not displayed.   Imaging/Diagnostic Tests: Echo: Technically difficult; definity used; normal LV systolic   function; grade 2 diastolic dysfunction; trace TR with mildly   elevated pulmonary pressure; cannot exclude vegetation with this   study; suggest TEE to further assess if clinically indicated.  Steve Rattler, DO 08/21/2016, 6:48 AM PGY-1, [place] Intern pager: [phone], text pages welcome

## 2016-08-21 NOTE — Progress Notes (Signed)
Occupational Therapy Treatment Patient Details Name: Kathleen Roy MRN: [id number] DOB: Sep 25, 1961 Today's Date: 08/21/2016    History of present illness pt is a 55 y/o female with h/o cellulitis R LE, chronic Venous Stasis, DM, HTN, OSA and obesity hypoventilation syndrome, Peripheral Neuropathy, admitted with R LE redness and pain due to worsening cellulitis   OT comments  Session focused on finding tolerable position in recliner to achieve BLE elevation and education regarding importance of BLE elevation. Patient tolerated well and verbalized understanding. She expressed frustration on not getting a lot of sleep over the past few nights due to "restless legs." OT will continue to follow.   Follow Up Recommendations  SNF;Supervision/Assistance - 24 hour    Equipment Recommendations   (tbd next venue)    Recommendations for Other Services      Precautions / Restrictions Precautions Precautions: Fall       Mobility Bed Mobility                  Transfers                      Balance                                   ADL Overall ADL's : Needs assistance/impaired                                       General ADL Comments: Education provided to patient on elevation of BLEs as she prefers to sit in recliner with legs down. Problem solving with patient including trying different positions in recliner with back reclined vs not to find a good position. Successful as pt OK with alternating legs elevated/legs down so long as her UB is reclined when legs up. Patient reports she is awaiting ACE wraps for her legs as well as a PICC line.       Vision                     Perception     Praxis      Cognition   Behavior During Therapy: WFL for tasks assessed/performed Overall Cognitive Status: Within Functional Limits for tasks assessed                       Extremity/Trunk Assessment               Exercises      Shoulder Instructions       General Comments      Pertinent Vitals/ Pain       Pain Assessment: No/denies pain  Home Living                                          Prior Functioning/Environment              Frequency Min 2X/week     Progress Toward Goals  OT Goals(current goals can now be found in the care plan section)  Progress towards OT goals: Progressing toward goals  Acute Rehab OT Goals Patient Stated Goal: rehab prior to home  Plan Discharge plan remains appropriate    Co-evaluation  End of Session     Activity Tolerance Patient tolerated treatment well   Patient Left in chair;with call bell/phone within reach;with chair alarm set   Nurse Communication          Time: [id number] OT Time Calculation (min): 20 min  Charges: OT General Charges $OT Visit: 1 Procedure OT Treatments $Self Care/Home Management : 8-22 mins  Tavon Magnussen A 08/21/2016, 12:29 PM

## 2016-08-21 NOTE — Progress Notes (Signed)
Peripherally Inserted Central Catheter/Midline Placement  The IV Nurse has discussed with the patient and/or persons authorized to consent for the patient, the purpose of this procedure and the potential benefits and risks involved with this procedure.  The benefits include less needle sticks, lab draws from the catheter and patient may be discharged home with the catheter.  Risks include, but not limited to, infection, bleeding, blood clot (thrombus formation), and puncture of an artery; nerve damage and irregular heat beat.  Alternatives to this procedure were also discussed.  PICC/Midline Placement Documentation        Kathleen Roy 08/21/2016, 5:43 PM

## 2016-08-21 NOTE — Progress Notes (Signed)
Physical Therapy Treatment Patient Details Name: Kathleen Roy MRN: [id number] DOB: [birth year] Today's Date: 08/21/2016    History of Present Illness pt is a 55 y/o female with h/o cellulitis R LE, chronic Venous Stasis, DM, HTN, OSA and obesity hypoventilation syndrome, Peripheral Neuropathy, admitted with R LE redness and pain due to worsening cellulitis    PT Comments    Encouragement from both OT and PT to get her feet out of a dependent position.  Emphasis on gait and activity on her feet.  Will need to use O2 and encourage pt to "push" her self a little further.  Follow Up Recommendations  SNF     Equipment Recommendations  Rolling walker with 5" wheels    Recommendations for Other Services       Precautions / Restrictions Precautions Precautions: Fall    Mobility  Bed Mobility               General bed mobility comments: NT, up in the recliner on arrival  Transfers Overall transfer level: Needs assistance Equipment used: Rolling walker (2 wheeled) Transfers: Sit to/from Stand Sit to Stand: Min guard         General transfer comment: Used UE's and momentum to come forward and up without assist  Ambulation/Gait Ambulation/Gait assistance: Min guard Ambulation Distance (Feet): 30 Feet Assistive device: Rolling walker (2 wheeled) Gait Pattern/deviations: Step-through pattern Gait velocity: slow Gait velocity interpretation: Below normal speed for age/gender General Gait Details: short, low amplitude, waddling type gait.   Stairs            Wheelchair Mobility    Modified Rankin (Stroke Patients Only)       Balance Overall balance assessment: Needs assistance Sitting-balance support: No upper extremity supported Sitting balance-Leahy Scale: Good       Standing balance-Leahy Scale: Poor                      Cognition Arousal/Alertness: Awake/alert Behavior During Therapy: WFL for tasks assessed/performed Overall Cognitive  Status: Within Functional Limits for tasks assessed                      Exercises      General Comments General comments (skin integrity, edema, etc.): Sats dropped to 80% on RA with EHR at 89 bpm over 30 feet of distance.  Pt placed back on 4L.  She was allow for her feet to be dependent to eat lunch.  Will return to encourage feet up after lunch.      Pertinent Vitals/Pain Pain Assessment: No/denies pain    Home Living                      Prior Function            PT Goals (current goals can now be found in the care plan section) Acute Rehab PT Goals Patient Stated Goal: rehab prior to home PT Goal Formulation: With patient Time For Goal Achievement: 09/02/16 Potential to Achieve Goals: Fair Progress towards PT goals: Progressing toward goals    Frequency  Min 3X/week    PT Plan Current plan remains appropriate    Co-evaluation             End of Session   Activity Tolerance: Patient tolerated treatment well;Patient limited by fatigue Patient left: in chair;with call bell/phone within reach;with chair alarm set     Time: 1207-1221 PT Time Calculation (min) (ACUTE ONLY):  14 min  Charges:  $Gait Training: 8-22 mins                    G Codes:      Coty Student, Tessie Fass 08/21/2016, 12:37 PM 08/21/2016  Donnella Sham, PT [phone]  (pager)

## 2016-08-21 NOTE — Discharge Summary (Signed)
[facility] Discharge Summary  [name] Medical record number: [id number] Date of birth: 1961/12/01 Age: 55 y.o. Gender: female Date of Admission: 08/16/2016  Date of Discharge: 08/22/16  Admitting Physician: Blane Ohara McDiarmid, MD  Primary Care Provider: Evette Doffing, MD Consultants: Wound care, Infectious Disease  Indication for Hospitalization: RLE cellulitis  Discharge Diagnoses/Problem List:  Patient Active Problem List   Diagnosis Date Noted  . Bacteremia   . Cellulitis of right lower extremity 06/04/2016  . Type 2 diabetes mellitus ([place]) 02/14/2016  . Pressure ulcer 01/16/2016  . Acute on chronic respiratory failure with hypercapnia ([place])   . Chronic diastolic congestive heart failure ([place])   . Essential hypertension   . CAP (community acquired pneumonia) 01/15/2016  . Bilateral knee pain 03/22/2015  . OSA (obstructive sleep apnea)   . Obesity hypoventilation syndrome ([place])   . Chronic respiratory failure with hypoxia ([place])   . Assistance needed for grooming 11/08/2014  . Fatigue 11/03/2014  . Memory loss 03/18/2014  . Tremor 03/18/2014  . Vulvovaginal candidiasis 11/25/2013  . Restrictive airway disease 08/26/2013  . Hypokalemia 08/19/2013  . Health care maintenance 06/17/2013  . Venous stasis dermatitis of both lower extremities 12/02/2012  . COPD (chronic obstructive pulmonary disease) ([place]) 10/15/2011  . Peripheral neuropathy ([place]) 10/15/2011  . KNEE PAIN, BILATERAL 01/22/2011  . BACK PAIN, LUMBAR, CHRONIC 01/22/2011  . MIGRAINE HEADACHE 10/25/2010  . HYPERLIPIDEMIA 04/07/2009  . EDEMA, CHRONIC 06/27/2008  . Morbid obesity ([place]) 11/16/2007  . DEPRESSION, MAJOR, RECURRENT 02/12/2007  . PANIC ATTACKS 02/12/2007  . HYPERTENSION, BENIGN SYSTEMIC 02/12/2007   Disposition: to SNF  Discharge Condition: stable, improved  Discharge Exam:  General: lying comfortably with HOB elevated, morbidly obese, in NAD CV:  difficult to auscultate due to body habitus, regular rate and rhythm without murmurs rubs or gallops Lungs: no increased work of breathing, difficult lung exam due to body habitus Abdomen: obese abdomen, non-tender, soft  Skin: skin changes from chronic venous stasis, RLE erythematous, massive lymphedema. Some LLE erythema on anterior shin, no drainage  Brief Hospital Course:   Ms. Sailer presented to [facility] ED with RLE cellulitis and was admitted on  08/16/16  for IV antibiotics, found to have MRSE bacteremia. PMH is significant for morbid obesity, depression, HTN, panic attacks, HLD, chronic pain, OSA, obesity hypoventilation syndrome (4L at home), chronic diastolic HF, DM2, venous stasis   MRSE Bacteremia Source RLE cellulitis. 2/2 blood cultures from 9/1positive for MRSE. Patient remained afebrile thoughout her hospitalization. She was given IV vancomycin starting on 08/18/16. Cardiac echo was done to rule out vegetations and did not have significant findings. ID recommended against a TEE for MRSE bacteremia with known source. ID recommends a total of 14 days of IV vancomycin for bacteremia. Repeat blood cultures from 9/4 were negative for 72 hours at time of discharge. A PICC line was placed on 9/6 for prolonged IV antibiotic administration and discharge to SNF for further care.   Cellulitis of RLE Cellulitis has been present for several months and unsuccessfully treated as an outpatient. Home health finally was able to convince patient to come to ED for evaluation and treatment which prompted admission. Cellulitis secondary to chronic venous stasis and skin breakdown. Patient with massive lipedema bilaterally, and skin erythema over right LE to her knee. DVT ruled out with LE dopplers. Patient was treated with IV antibiotics and discharged with PICC to complete 14 day course of IV vancomycin. Wound care was consulted  during her hospitalization and recommended for lower extremities to cleanse with  soap and water daily to and apply eucerin cream to aid with chronically dry skin. To dry, scaled area beside right vulva in skin fold, cleanse with NS, gently pat dry, apply Xeroform gauze, foam dressing for padding, change every shift.  HFpEF Uncertainty around when patient was given this diagnosis. Recent studies have not revealed decreased EF or diastolic dysfunction. Likely patient was started on diuresis for lower extremity edema that is secondary to chronic venous stasis. Home torsemide dose was held during admission and patient got IV lasix for diuresis. Last echo in 01/2016 with EF of 55-60% without functional or structural abnormalities. Started IV lasix 80 mg twice a day then increased to 100mg  bid to aid with diuresis/edema. Diuresis was adequate during hospitalization but did not aid in reducing leg swelling. IV Lasix was discontinued on 9/6 and her home dose of Torsemide was restarted. Due to diuresis, potassium was low during admission and was repleted with oral potassium. Creatinine tolerated diuresis well.  Chronic Venous Stasis/massive lipedema This is a chronic issue for patient, present for past 10 years and causing RLE cellulitis that led to the bacteremia and hospital admission. Wound care consult in the hospital recommended soap and water to lower extremities and applying eucerin cream liberally. Additionally, we recommended patient keep her legs elevated and attempted to add ace wrap for compression of legs. Patient initially refused this but after some discussion understands that compression is likely the only intervention that will decrease her lower leg swelling, as diuretics cannot decrease extravascular edema. Plan to continue compression after discharge and consider outpatient wound care for RLE cellulitis. Ace wraps should be started from the feet and wrapped up to the knees to compress both feet and lower legs.   Prediabetes Stable during admission, patient was on a  sensitive sliding scale insulin. Last A1c 6.3 in 05/2016. Patient's home medications were held during admission and restarted upon discharge to SNF. Patient takes Glipizide 5mg  as needed for blood sugars >100.  HTN Normotensive throughout hospitalization. Home blood pressure medicine was held while diuresed. Home Triamtereme-HCTZ was held upon discharge to SNF due to normal blood pressures without.  Bilateral Knee Pain/OA and recent fall Patient reported recent fall on admission with increased pain in her knees. X-rays of bilateral knees without acute process. Patient has chronic pain and she is on Percocet 10/325 3 times a day and Flexeril 10 mg 3 times a day at home. Patient is also on Lyrica 150 mg twice a day for neuropathy and trazodone. The combination of these along with his severe obesity may increase her risk of fall. Her pain medicines were weaned down to 7.5 mg oxycodone in hospital with scheduled tylenol. Upon discharge can resume Percocet at reduced dose and continue to wean down as outpatient.   Issues for Follow Up:  1. Complete two weeks IV vancomycin via PICC, monitor dosage per pharmacy at SNF (began 9/3- stop date 9/16) 2. Continue wound care for RLE cellulitis- clean with soap and water daily, apply eucerin cream 3. Continue wound care for pannus folds- Wash with soap and water. Pat dry carefully.  Place interdry between skin folds of pannas with 2 inches of fabric free on both sides. 4. Continue would care for skin fold beside right vulva- Cleanse with NS, gently pat dry, apply Xeroform gauze, foam dsg for padding, change every shift 5. Recommend developing treatment plan for morbid obesity as outpatient, consider surgical intervention  6. No clear diagnosis of heart failure, EF >60% inpatient without signs of diastolic dysfuntion, clarify diagnosis as outpatient 7. Continue to wean down pain medications, recommend 25% reduction weekly in opiate dosage 8. Continue to elevate and  compress legs to treat chronic venous stasis, consider wound care as outpatient 9. Consider restarting Triamtereme-HCTZ, was held at time of discharge to SNF while diuresing  10. Potassium was low during hospitalization, recommend checking BMP as outpatient, patient was discharged with oral potassium supplementation   Significant Procedures: PICC placement 9/6  Significant Labs and Imaging:   Recent Labs Lab 08/20/16 0754 08/21/16 0554 08/22/16 0338  WBC 6.1 6.6 7.4  HGB 11.3* 11.4* 11.9*  HCT 37.8 37.8 38.5  PLT 169 190 193    Recent Labs Lab 08/16/16 0306  08/18/16 0537 08/19/16 0509 08/20/16 0754 08/21/16 0554 08/22/16 0338  NA 138  < > 140 140 139 140 141  K 3.1*  < > 3.3* 2.9* 3.3* 3.5 3.2*  CL 87*  < > 96* 95* 96* 96* 96*  CO2 39*  < > 37* 38* 34* 33* 37*  GLUCOSE 171*  < > 132* 136* 108* 109* 135*  BUN 19  < > 15 15 14 15 15   CREATININE 1.03*  < > 0.89 0.97 0.85 0.90 0.96  CALCIUM 9.8  < > 9.0 8.8* 8.7* 9.0 9.0  ALKPHOS 72  --   --   --   --   --   --   AST 14*  --   --   --   --   --   --   ALT 11*  --   --   --   --   --   --   ALBUMIN 3.3*  --   --   --   --   --   --   < > = values in this interval not displayed.   Results/Tests Pending at Time of Discharge: none  Discharge Medications:    Medication List    STOP taking these medications   oxyCODONE-acetaminophen 10-325 MG tablet Commonly known as:  PERCOCET   triamterene-hydrochlorothiazide 37.5-25 MG tablet Commonly known as:  MAXZIDE-25     TAKE these medications   ARIPiprazole 2 MG tablet Commonly known as:  ABILIFY take 1 tablet by mouth every other day   cetirizine 10 MG tablet Commonly known as:  ZYRTEC Take 10 mg by mouth daily.   cyclobenzaprine 10 MG tablet Commonly known as:  FLEXERIL Take 1 tablet (10 mg total) by mouth 3 (three) times daily as needed for muscle spasms.   Fish Oil 1000 MG Caps Take 2 capsules (2,000 mg total) by mouth at bedtime.   glipiZIDE 5 MG  tablet Commonly known as:  GLUCOTROL take 1 tablet by mouth once daily   hydrocerin Crea Apply 1 application topically 2 (two) times daily.   nystatin powder Commonly known as:  MYCOSTATIN/NYSTOP Apply topically 2 (two) times daily. Apply to affected skin BID What changed:  how much to take  when to take this  reasons to take this  additional instructions   oxyCODONE 15 MG immediate release tablet Commonly known as:  ROXICODONE Take 0.5 tablets (7.5 mg total) by mouth every 6 (six) hours as needed for severe pain.   polyethylene glycol packet Commonly known as:  MIRALAX / GLYCOLAX Take 17 g by mouth at bedtime.   potassium chloride SA 20 MEQ tablet Commonly known as:  K-DUR,KLOR-CON Take 2 tablets (40 mEq  total) by mouth daily.   pravastatin 40 MG tablet Commonly known as:  PRAVACHOL Take 1 tablet (40 mg total) by mouth at bedtime.   pregabalin 150 MG capsule Commonly known as:  LYRICA Take 1 capsule (150 mg total) by mouth 2 (two) times daily. What changed:  See the new instructions.   torsemide 20 MG tablet Commonly known as:  DEMADEX take 2 tablets by mouth twice a day What changed:  See the new instructions.   traZODone 150 MG tablet Commonly known as:  DESYREL Take 2 tablets (300 mg total) by mouth at bedtime.   Vancomycin 750-5 MG/150ML-% Soln Commonly known as:  VANCOCIN Inject 150 mLs (750 mg total) into the vein every 12 (twelve) hours. Start 08/22/16 at 2200, last dose 08/31/16 at 2200   venlafaxine XR 150 MG 24 hr capsule Commonly known as:  EFFEXOR-XR Take 2 capsules (300 mg total) by mouth daily.   vitamin C 1000 MG tablet Take 3,000 mg by mouth at bedtime.       Discharge Instructions: Please refer to Patient Instructions section of EMR for full details.  Patient was counseled important signs and symptoms that should prompt return to medical care, changes in medications, dietary instructions, activity restrictions, and follow up appointments.    Follow-Up Appointments: Follow-up Information    Evette Doffing, MD. Schedule an appointment as soon as possible for a visit in 3 week(s).   Specialty:  Family Medicine Contact information: [place] [address] [phone]           Steve Rattler, DO 08/22/2016, 1:02 PM PGY-1, [place]

## 2016-08-21 NOTE — H&P (Deleted)
Family Medicine Teaching Service Daily Progress Note Intern Pager: [phone]  [name] Medical record number: [id number] Date of birth: 1961-04-06 Age: 55 y.o. Gender: female  Primary Care Provider: Evette Doffing, MD Consultants: wound care, ID Code Status: DNR  Pt Overview and Major Events to Date:  9/1: Admit to FPTS  Assessment and Plan: Kathleen Roy a 55 y.o.femalepresenting with RLE cellulitis and bacteremia. PMH is significant for morbid obesity, depression, HTN, panic attacks, HLD, chronic pain, OSA, obesity hypoventilation syndrome (4L at home), chronic diastolic HF, DM2, venous stasis   Bacteremia: 2/2 blood cultures from 9/1coag negative staph. Afebrile. Cardiac exam without murmurs. Vanc trough high at 31 on 9/4, vanc was held that day. Has been continued per pharm dosing. -per ID recs 9/5 via phone, because two blood cultures were positive for MRSE, recommend PICC and 2 weeks of IV vanc treatment -Continue Vanc per pharmacy dosing -repeat blood cultures negative at 24 hours -can place PICC once cultures negative for 48 hours -ECHO showed no signs of vegetations, per ID TEE is not necessary.  Cellulitis of RLE: Patient with massive lipedema bilaterally, and skin erythema over right LE to her knee. No leukocytosis. Lactic acid wnl. Afebrile. DVT ruled out.  -Antibiotics as above -follow wound care recs -Continue to monitor on telemetry  -PT/OT recommend SNF placement, need for 24 hour supervision -patient refused leg compression with ace wraps, will offer again today seems like she would reconsider  -keep legs elevated  Abnormal EKG:  RBBB (old). Troponins negative x 3.  -monitor  HFpEF: Last echo in 01/2016 with EF of 55-60% without functional or structural abnormalities. On torsemide 80 mg twice a day at home. Started IV lasix 80 mg twice a day then increased to 100mg  bid to aid with diuresis/edema. Out 574mL over past 24 hours. -Continue IV Lasix  100 mg twice a day -Daily BMP, Cr today 0.90  Hypokalemia: K 2.9--> 3.3 --> 3.5 this morning, will continue to replace because she is still on high dose of Lasix -K-Dur 40 mEq  x2 today -follow BMP   Chronic Venous Stasis/massive lipedema: - wound care consult, follow recs  Prediabetes:Last A1c 6.3 in 05/2016. TakesGlipizide PRN (when cbg >100). Glucose stable.  - CBGs ACHS  - sensitive SSI  [id number] - holding home Triamtereme-HCTZ - Increased IV Lasix 100 mg twice a day  Peripheral Neuropathy: - home Lyrica  Bilateral Knee Pain/OA and recent fall:x-rays of bilateral knees without acute process. She is on Percocet 10/325 3 times a day and Flexeril 10 mg 3 times a day at home. Patient is also on Lyrica 150 mg twice a day for neuropathy and trazodone. The combination of these along with his severe obesity may increase her risk of fall.  - 7.5 mg oxycodone as needed for pain - scheduled tylenol for pain control -continue to wean down pain medications - PT consult recommended SNF after discharge  HLD: Lipid panel in January 2017: Cholesterol 119, triglyceride 159, HDL 45 and LDL 42 - Continue home pravastatin  Mood Disorder/Insomnia: On trazodone, Effexor and Abilify at home -Continue home meds  Severe obesity: She has cane at home. She likes to have walker.  FEN/GI: heart healthy carb modified  Prophylaxis: Lovenox  Disposition: SNF   Subjective:  Kathleen Roy is feeling well today. Had trouble sleeping due to leg discomfort/pillow placement. Denies fevers, chills, nausea, vomiting, shortness of breath.   Objective: Temp:  [97.9 F (36.6 C)-98 F (36.7 C)] 98  F (36.7 C) (09/06 0551) Pulse Rate:  [76-87] 87 (09/06 0551) Resp:  [14-18] 18 (09/06 0551) BP: (108-146)/(49-126) 108/65 (09/06 0551) SpO2:  [95 %-96 %] 96 % (09/06 0551) Physical Exam: General: lying comfortably with HOB elevated, morbidly obese, in NAD CV: difficult to auscultate due to  body habitus, regular rate and rhythm without murmurs rubs or gallops Lungs: no increased work of breathing, difficult lung exam due to body habitus Abdomen: obese abdomen, non-tender, soft  Skin: skin changes from chronic venous stasis, RLE erythematous, massive lipedema. Some LLE erythema on anterior shin, no drainage  Laboratory:  Recent Labs Lab 08/18/16 0537 08/19/16 0509 08/20/16 0754  WBC 6.9 6.3 6.1  HGB 11.0* 11.0* 11.3*  HCT 36.3 36.9 37.8  PLT 200 192 169    Recent Labs Lab 08/16/16 0306  08/18/16 0537 08/19/16 0509 08/20/16 0754  NA 138  < > 140 140 139  K 3.1*  < > 3.3* 2.9* 3.3*  CL 87*  < > 96* 95* 96*  CO2 39*  < > 37* 38* 34*  BUN 19  < > 15 15 14   CREATININE 1.03*  < > 0.89 0.97 0.85  CALCIUM 9.8  < > 9.0 8.8* 8.7*  PROT 6.7  --   --   --   --   BILITOT 0.2*  --   --   --   --   ALKPHOS 72  --   --   --   --   ALT 11*  --   --   --   --   AST 14*  --   --   --   --   GLUCOSE 171*  < > 132* 136* 108*  < > = values in this interval not displayed.   Imaging/Diagnostic Tests: Echo: Technically difficult; definity used; normal LV systolic   function; grade 2 diastolic dysfunction; trace TR with mildly   elevated pulmonary pressure; cannot exclude vegetation with this   study; suggest TEE to further assess if clinically indicated.  Steve Rattler, DO 08/21/2016, 6:48 AM PGY-1, [place] Intern pager: [phone], text pages welcome

## 2016-08-22 DIAGNOSIS — M25561 Pain in right knee: Secondary | ICD-10-CM | POA: Diagnosis not present

## 2016-08-22 DIAGNOSIS — G9009 Other idiopathic peripheral autonomic neuropathy: Secondary | ICD-10-CM | POA: Diagnosis not present

## 2016-08-22 DIAGNOSIS — M25562 Pain in left knee: Secondary | ICD-10-CM | POA: Diagnosis not present

## 2016-08-22 DIAGNOSIS — I8312 Varicose veins of left lower extremity with inflammation: Secondary | ICD-10-CM | POA: Diagnosis not present

## 2016-08-22 DIAGNOSIS — I8311 Varicose veins of right lower extremity with inflammation: Secondary | ICD-10-CM | POA: Diagnosis not present

## 2016-08-22 DIAGNOSIS — J9622 Acute and chronic respiratory failure with hypercapnia: Secondary | ICD-10-CM | POA: Diagnosis not present

## 2016-08-22 DIAGNOSIS — E119 Type 2 diabetes mellitus without complications: Secondary | ICD-10-CM | POA: Diagnosis not present

## 2016-08-22 DIAGNOSIS — J9621 Acute and chronic respiratory failure with hypoxia: Secondary | ICD-10-CM | POA: Diagnosis not present

## 2016-08-22 DIAGNOSIS — F330 Major depressive disorder, recurrent, mild: Secondary | ICD-10-CM | POA: Diagnosis not present

## 2016-08-22 DIAGNOSIS — E876 Hypokalemia: Secondary | ICD-10-CM | POA: Diagnosis not present

## 2016-08-22 DIAGNOSIS — E118 Type 2 diabetes mellitus with unspecified complications: Secondary | ICD-10-CM | POA: Diagnosis not present

## 2016-08-22 DIAGNOSIS — R7881 Bacteremia: Secondary | ICD-10-CM | POA: Diagnosis not present

## 2016-08-22 DIAGNOSIS — I10 Essential (primary) hypertension: Secondary | ICD-10-CM | POA: Diagnosis not present

## 2016-08-22 DIAGNOSIS — M79606 Pain in leg, unspecified: Secondary | ICD-10-CM | POA: Diagnosis not present

## 2016-08-22 DIAGNOSIS — I110 Hypertensive heart disease with heart failure: Secondary | ICD-10-CM | POA: Diagnosis not present

## 2016-08-22 DIAGNOSIS — R2681 Unsteadiness on feet: Secondary | ICD-10-CM | POA: Diagnosis not present

## 2016-08-22 DIAGNOSIS — M797 Fibromyalgia: Secondary | ICD-10-CM | POA: Diagnosis not present

## 2016-08-22 DIAGNOSIS — L03115 Cellulitis of right lower limb: Secondary | ICD-10-CM | POA: Diagnosis not present

## 2016-08-22 DIAGNOSIS — M6281 Muscle weakness (generalized): Secondary | ICD-10-CM | POA: Diagnosis not present

## 2016-08-22 DIAGNOSIS — L03119 Cellulitis of unspecified part of limb: Secondary | ICD-10-CM | POA: Diagnosis not present

## 2016-08-22 DIAGNOSIS — E785 Hyperlipidemia, unspecified: Secondary | ICD-10-CM | POA: Diagnosis not present

## 2016-08-22 DIAGNOSIS — R531 Weakness: Secondary | ICD-10-CM | POA: Diagnosis not present

## 2016-08-22 DIAGNOSIS — I5032 Chronic diastolic (congestive) heart failure: Secondary | ICD-10-CM | POA: Diagnosis not present

## 2016-08-22 DIAGNOSIS — M25569 Pain in unspecified knee: Secondary | ICD-10-CM | POA: Diagnosis not present

## 2016-08-22 DIAGNOSIS — J449 Chronic obstructive pulmonary disease, unspecified: Secondary | ICD-10-CM | POA: Diagnosis not present

## 2016-08-22 DIAGNOSIS — J9691 Respiratory failure, unspecified with hypoxia: Secondary | ICD-10-CM | POA: Diagnosis not present

## 2016-08-22 DIAGNOSIS — F339 Major depressive disorder, recurrent, unspecified: Secondary | ICD-10-CM | POA: Diagnosis not present

## 2016-08-22 DIAGNOSIS — J189 Pneumonia, unspecified organism: Secondary | ICD-10-CM | POA: Diagnosis not present

## 2016-08-22 DIAGNOSIS — J302 Other seasonal allergic rhinitis: Secondary | ICD-10-CM | POA: Diagnosis not present

## 2016-08-22 LAB — GLUCOSE, CAPILLARY
GLUCOSE-CAPILLARY: 121 mg/dL — AB (ref 65–99)
GLUCOSE-CAPILLARY: 79 mg/dL (ref 65–99)
GLUCOSE-CAPILLARY: 128 mg/dL — AB (ref 65–99)
Glucose-Capillary: 108 mg/dL — ABNORMAL HIGH (ref 65–99)

## 2016-08-22 LAB — BASIC METABOLIC PANEL
Anion gap: 8 (ref 5–15)
BUN: 15 mg/dL (ref 6–20)
CALCIUM: 9 mg/dL (ref 8.9–10.3)
CO2: 37 mmol/L — AB (ref 22–32)
CREATININE: 0.96 mg/dL (ref 0.44–1.00)
Chloride: 96 mmol/L — ABNORMAL LOW (ref 101–111)
GFR calc Af Amer: >60 mL/min (ref >60)
GFR calc non Af Amer: >60 mL/min (ref >60)
GLUCOSE: 135 mg/dL — AB (ref 65–99)
Potassium: 3.2 mmol/L — ABNORMAL LOW (ref 3.5–5.1)
Sodium: 141 mmol/L (ref 135–145)

## 2016-08-22 LAB — CBC
HEMATOCRIT: 38.5 % (ref 36.0–46.0)
Hemoglobin: 11.9 g/dL — ABNORMAL LOW (ref 12.0–15.0)
MCH: 29.7 pg (ref 26.0–34.0)
MCHC: 30.9 g/dL (ref 30.0–36.0)
MCV: 96 fL (ref 78.0–100.0)
Platelets: 193 10*3/uL (ref 150–400)
RBC: 4.01 MIL/uL (ref 3.87–5.11)
RDW: 15.7 % — AB (ref 11.5–15.5)
WBC: 7.4 10*3/uL (ref 4.0–10.5)

## 2016-08-22 MED ORDER — HEPARIN SOD (PORK) LOCK FLUSH 100 UNIT/ML IV SOLN
250.0000 [IU] | INTRAVENOUS | Status: AC | PRN
  Administered 2016-08-22: 250 [IU]

## 2016-08-22 MED ORDER — HYDROCERIN EX CREA: 1.0000 "application " | TOPICAL_CREAM | Freq: Two times a day (BID) | CUTANEOUS | 0 refills | Status: DC

## 2016-08-22 MED ORDER — OXYCODONE HCL 15 MG PO TABS: 7.5000 mg | ORAL_TABLET | Freq: Four times a day (QID) | ORAL | 0 refills | Status: DC | PRN

## 2016-08-22 MED ORDER — POTASSIUM CHLORIDE CRYS ER 20 MEQ PO TBCR
40.0000 meq | EXTENDED_RELEASE_TABLET | Freq: Three times a day (TID) | ORAL | Status: DC
  Administered 2016-08-22 (×2): 40 meq via ORAL
  Filled 2016-08-22 (×2): qty 2

## 2016-08-22 MED ORDER — VANCOMYCIN HCL IN DEXTROSE 750-5 MG/150ML-% IV SOLN: 750.0000 mg | Freq: Two times a day (BID) | INTRAVENOUS | 0 refills | Status: DC

## 2016-08-22 MED ORDER — PREGABALIN 150 MG PO CAPS: 150.0000 mg | ORAL_CAPSULE | Freq: Two times a day (BID) | ORAL | 0 refills | Status: DC

## 2016-08-22 NOTE — Discharge Instructions (Signed)
Cellulitis °Cellulitis is an infection of the skin and the tissue under the skin. The infected area is usually red and tender. This happens most often in the arms and lower legs. °HOME CARE  °· Take your antibiotic medicine as told. Finish the medicine even if you start to feel better. °· Keep the infected arm or leg raised (elevated). °· Put a warm cloth on the area up to 4 times per day. °· Only take medicines as told by your doctor. °· Keep all doctor visits as told. °GET HELP IF: °· You see red streaks on the skin coming from the infected area. °· Your red area gets bigger or turns a dark color. °· Your bone or joint under the infected area is painful after the skin heals. °· Your infection comes back in the same area or different area. °· You have a puffy (swollen) bump in the infected area. °· You have new symptoms. °· You have a fever. °GET HELP RIGHT AWAY IF:  °· You feel very sleepy. °· You throw up (vomit) or have watery poop (diarrhea). °· You feel sick and have muscle aches and pains. °  °This information is not intended to replace advice given to you by your health care provider. Make sure you discuss any questions you have with your health care provider. °  °Document Released: 05/20/2008 Document Revised: 08/23/2015 Document Reviewed: 02/17/2012 °Elsevier Interactive Patient Education ©2016 [employer] ° °

## 2016-08-22 NOTE — Clinical Social Work Placement (Signed)
   CLINICAL SOCIAL WORK PLACEMENT  NOTE  Date:  08/22/2016  Patient Details  Name: Kathleen Roy MRN: [id number] Date of Birth: April 10, 1961  Clinical Social Work is seeking post-discharge placement for this patient at the [place] level of care (*CSW will initial, date and re-position this form in  chart as items are completed):  Yes   Patient/family provided with [facility] Work Department's list of facilities offering this level of care within the geographic area requested by the patient (or if unable, by the patient's family).  Yes   Patient/family informed of their freedom to choose among providers that offer the needed level of care, that participate in Medicare, Medicaid or managed care program needed by the patient, have an available bed and are willing to accept the patient.  Yes   Patient/family informed of [facility]'s ownership interest in [facility] and [facility], as well as of the fact that they are under no obligation to receive care at these facilities.  PASRR submitted to EDS on 08/19/16     PASRR number received on       Existing PASRR number confirmed on       FL2 transmitted to all facilities in geographic area requested by pt/family on 08/19/16     FL2 transmitted to all facilities within larger geographic area on       Patient informed that his/her managed care company has contracts with or will negotiate with certain facilities, including the following:        Yes   Patient/family informed of bed offers received.  Patient chooses bed at [facility]     Physician recommends and patient chooses bed at      Patient to be transferred to [facility] on 08/22/16.  Patient to be transferred to facility by [facility]     Patient family notified on 08/22/16 of transfer.  Name of family member notified:  Patient calling her brother to notify     PHYSICIAN Please  sign FL2     Additional Comment:    _______________________________________________ Benard Halsted, [place] 08/22/2016, 4:34 PM

## 2016-08-22 NOTE — Progress Notes (Signed)
Patient will DC to: [facility] Anticipated DC date: 08/22/16 Family notified: Patient calling brother Transport by: Barbaraann Barthel   Per MD patient ready for DC to [facility]. RN, patient, patient's family, and facility notified of DC. Discharge Summary sent to facility. RN given number for report. DC packet on chart. Ambulance transport requested for patient.   CSW signing off.  Cedric Fishman, [place] Social Worker [phone]

## 2016-08-22 NOTE — Progress Notes (Signed)
Family Medicine Teaching Service Daily Progress Note Intern Pager: [phone]  [name] Medical record number: [id number] Date of birth: June 28, 1961 Age: 55 y.o. Gender: female  Primary Care Provider: Evette Doffing, MD Consultants: Wound care, ID Code Status: DNR  Pt Overview and Major Events to Date:  9/1: Admit to FPTS  Assessment and Plan: Kathleen Roy a 55 y.o.femalepresenting with RLE cellulitis and bacteremia. PMH is significant for morbid obesity, depression, HTN, panic attacks, HLD, chronic pain, OSA, obesity hypoventilation syndrome (4L at home), chronic diastolic HF, DM2, venous stasis   Bacteremia: 2/2 blood cultures from 9/1coag negative staph MRSE. Afebrile. Cardiac exam without murmurs. Vanc trough high at 31 on 9/4, vanc was held that day. Has been continued per pharm dosing. -per ID recs 9/5 via phone, because two blood cultures were positive for MRSE, recommend PICC and 2 weeks of IV vanc treatment -Continue Vanc per pharmacy dosing -repeat blood cultures negative at 48 hours -PICC placed 08/21/16 -ECHO showed no signs of vegetations, per ID TEE is not necessary -stable to d/c to SNF  Cellulitis of RLE: Patient with massive lipedema bilaterally, and skin erythema over right LE to her knee. No leukocytosis. Lactic acid wnl. Afebrile. DVT ruled out.  -Antibiotics as above -follow wound care recs -Continue to monitor on telemetry  -PT/OT recommend SNF placement, need for 24 hour supervision -patient refused leg compression with ace wraps, will offer again today seems like she would reconsider  -keep legs elevated  Abnormal EKG:  RBBB (old). Troponins negative x 3. No chest pain. -monitor  HFpEF: Last echo in 01/2016 with EF of 55-60% without functional or structural abnormalities. On torsemide 80 mg twice a day at home. Started IV lasix during hospitalization but diuresis was not impressive, unsure of when this diagnosis of heart failure was given  as echo in hospital showed EF of 123456, no diastolic dysfunction. - home torsemide 40 mg bid  - recheck weight daily, up from 360 on admission to 363 -Daily BMP, Cr today 0.96  Hypokalemia: K 2.9--> 3.3 --> 3.5 --> 3.2 this morning, will continue to replace. Likely secondary to diuretics -K-Dur 40 mEq  x3 today -continue follow BMP   Chronic Venous Stasis/massive lipedema: - wound care consult, follow recs - should keep legs elevated, ace wrap compression as tolerated  Prediabetes:Last A1c 6.3 in 05/2016. TakesGlipizide PRN (when cbg >100). Glucose stable.  - CBGs ACHS  - sensitive SSI  [id number] - holding home Triamtereme-HCTZ - restarted home torsemide  Peripheral Neuropathy: - home Lyrica  Bilateral Knee Pain/OA and recent fall:x-rays of bilateral knees without acute process. She is on Percocet 10/325 3 times a day and Flexeril 10 mg 3 times a day at home. Patient is also on Lyrica 150 mg twice a day for neuropathy and trazodone. The combination of these along with his severe obesity may increase her risk of fall.  - 7.5 mg oxycodone as needed for pain - scheduled tylenol for pain control -continue to wean down pain medications - PT consult recommended SNF after discharge  HLD: Lipid panel in January 2017: Cholesterol 119, triglyceride 159, HDL 45 and LDL 42 - Continue home pravastatin  Mood Disorder/Insomnia: On trazodone, Effexor and Abilify at home -Continue home meds  Severe obesity: She has cane at home. She likes to have walker. -should be addressed as outpatient, strategies for weight loss  FEN/GI: heart healthy carb modified Prophylaxis: Lovenox  Disposition: SNF   Subjective:  Kathleen Roy is feeling  well today. Has decided on a SNF she likes. PICC went well. Has no fevers, chills, nausea, vomiting. Slept well last night. No complaints or questions at this time.  Objective: Temp:  [97.7 F (36.5 C)-98.3 F (36.8 C)] 98.1 F (36.7 C)  (09/07 0543) Pulse Rate:  [71-96] 96 (09/07 0543) Resp:  [18] 18 (09/07 0543) BP: (117-126)/(64-71) 121/71 (09/07 0543) SpO2:  [97 %-100 %] 99 % (09/07 0543) Weight:  [363 lb (164.7 kg)] 363 lb (164.7 kg) (09/07 0545) Physical Exam: General: lying comfortably with HOB elevated, morbidly obese, in NAD CV: difficult to auscultate due to body habitus, regular rate and rhythm without murmurs rubs or gallops Lungs: no increased work of breathing, difficult lung exam due to body habitus Abdomen: obese abdomen, non-tender, soft  Skin: skin changes from chronic venous stasis, RLE erythematous, massive lipedema. Some LLE erythema on anterior shin, no drainage  Laboratory:  Recent Labs Lab 08/20/16 0754 08/21/16 0554 08/22/16 0338  WBC 6.1 6.6 7.4  HGB 11.3* 11.4* 11.9*  HCT 37.8 37.8 38.5  PLT 169 190 193    Recent Labs Lab 08/16/16 0306  08/20/16 0754 08/21/16 0554 08/22/16 0338  NA 138  < > 139 140 141  K 3.1*  < > 3.3* 3.5 3.2*  CL 87*  < > 96* 96* 96*  CO2 39*  < > 34* 33* 37*  BUN 19  < > 14 15 15   CREATININE 1.03*  < > 0.85 0.90 0.96  CALCIUM 9.8  < > 8.7* 9.0 9.0  PROT 6.7  --   --   --   --   BILITOT 0.2*  --   --   --   --   ALKPHOS 72  --   --   --   --   ALT 11*  --   --   --   --   AST 14*  --   --   --   --   GLUCOSE 171*  < > 108* 109* 135*  < > = values in this interval not displayed.  Imaging/Diagnostic Tests: Echo: Technically difficult; definity used; normal LV systolic function; grade 2 diastolic dysfunction; trace TR with mildly elevated pulmonary pressure; cannot exclude vegetation with this study; suggest TEE to further assess if clinically indicated.  Steve Rattler, DO 08/22/2016, 7:18 AM PGY-1, [facility] Intern pager: [phone], text pages welcome

## 2016-08-22 NOTE — Care Management Important Message (Signed)
Important Message  Patient Details  Name: Kathleen Roy MRN: [id number] Date of Birth: 1961/11/18   Medicare Important Message Given:  Yes    Ocean Schildt Abena 08/22/2016, 10:56 AM

## 2016-08-23 ENCOUNTER — Encounter: Payer: Self-pay | Admitting: Adult Health

## 2016-08-23 ENCOUNTER — Non-Acute Institutional Stay (SKILLED_NURSING_FACILITY): Payer: Medicare Other | Admitting: Adult Health

## 2016-08-23 ENCOUNTER — Telehealth: Payer: Self-pay | Admitting: Internal Medicine

## 2016-08-23 DIAGNOSIS — L03115 Cellulitis of right lower limb: Secondary | ICD-10-CM | POA: Diagnosis not present

## 2016-08-23 DIAGNOSIS — I890 Lymphedema, not elsewhere classified: Secondary | ICD-10-CM | POA: Diagnosis not present

## 2016-08-23 DIAGNOSIS — J449 Chronic obstructive pulmonary disease, unspecified: Secondary | ICD-10-CM | POA: Diagnosis not present

## 2016-08-23 DIAGNOSIS — M6281 Muscle weakness (generalized): Secondary | ICD-10-CM | POA: Diagnosis not present

## 2016-08-23 DIAGNOSIS — I872 Venous insufficiency (chronic) (peripheral): Secondary | ICD-10-CM

## 2016-08-23 DIAGNOSIS — E118 Type 2 diabetes mellitus with unspecified complications: Secondary | ICD-10-CM | POA: Diagnosis not present

## 2016-08-23 DIAGNOSIS — R2681 Unsteadiness on feet: Secondary | ICD-10-CM | POA: Diagnosis not present

## 2016-08-23 DIAGNOSIS — M25561 Pain in right knee: Secondary | ICD-10-CM | POA: Diagnosis not present

## 2016-08-23 DIAGNOSIS — J9691 Respiratory failure, unspecified with hypoxia: Secondary | ICD-10-CM | POA: Diagnosis not present

## 2016-08-23 DIAGNOSIS — M25562 Pain in left knee: Secondary | ICD-10-CM | POA: Diagnosis not present

## 2016-08-23 DIAGNOSIS — M25569 Pain in unspecified knee: Secondary | ICD-10-CM | POA: Diagnosis not present

## 2016-08-23 DIAGNOSIS — I8311 Varicose veins of right lower extremity with inflammation: Secondary | ICD-10-CM | POA: Diagnosis not present

## 2016-08-23 DIAGNOSIS — M545 Low back pain: Secondary | ICD-10-CM | POA: Diagnosis not present

## 2016-08-23 DIAGNOSIS — G894 Chronic pain syndrome: Secondary | ICD-10-CM | POA: Diagnosis not present

## 2016-08-23 DIAGNOSIS — I5032 Chronic diastolic (congestive) heart failure: Secondary | ICD-10-CM | POA: Diagnosis not present

## 2016-08-23 DIAGNOSIS — J9622 Acute and chronic respiratory failure with hypercapnia: Secondary | ICD-10-CM | POA: Diagnosis not present

## 2016-08-23 DIAGNOSIS — J9611 Chronic respiratory failure with hypoxia: Secondary | ICD-10-CM | POA: Diagnosis not present

## 2016-08-23 DIAGNOSIS — I8312 Varicose veins of left lower extremity with inflammation: Secondary | ICD-10-CM

## 2016-08-23 DIAGNOSIS — E785 Hyperlipidemia, unspecified: Secondary | ICD-10-CM | POA: Diagnosis not present

## 2016-08-23 DIAGNOSIS — I10 Essential (primary) hypertension: Secondary | ICD-10-CM | POA: Diagnosis not present

## 2016-08-23 DIAGNOSIS — M25512 Pain in left shoulder: Secondary | ICD-10-CM | POA: Diagnosis not present

## 2016-08-23 NOTE — Telephone Encounter (Signed)
Left message for Kathleen Roy, CSW to help [place] look into other SNF options. Will follow-up on Monday.

## 2016-08-23 NOTE — Telephone Encounter (Signed)
Pt called because she has been moved to a [facility] and she is fear for her life. She is very upset and needs to be moved to a different facility. Please call her because she can not take this much longer. jw

## 2016-08-23 NOTE — Progress Notes (Signed)
Patient ID: Kathleen Roy, female   DOB: 19-Feb-1961, 55 y.o.   MRN: [id number]   Location:   [facility] Room Number: 108-A Place of Service:  SNF (31)   CODE STATUS: DNR  Allergies  Allergen Reactions  . Aspirin Hives    REACTION: hives Hasn't had in years    Chief Complaint  Patient presents with  . Discharge Note    HPI:  She is being discharged to another SNF; as she is not happy with the facility. She will not need dme; or home health. She will need her narcotic prescriptions written. She will follow up with the medical provider at the receiving facility.   Past Medical History:  Diagnosis Date  . Arthritis    "I feel like it's everywhere" (11/08/2014)  . Cellulitis and abscess of lower extremity 08/16/2016   right leg  . Chronic lower back pain   . COPD (chronic obstructive pulmonary disease) ([place])   . Depression   . Diabetes mellitus without complication ([place])    type 2   . Fibromyalgia   . Headache    "at least weekly; it's usually when I first wake up" (11/08/2014)  . Hyperlipidemia   . Hypertension   . Memory disturbance   . Migraines 1970's - <2000   "they just went away"  . Neuropathy ([place])   . Normal echocardiogram 05/30/05   suboptimal study  . Obesity, morbid (more than 100 lbs over ideal weight or BMI > 40) ([facility])    obese since childhood  . Osteoarthritis   . Peripheral edema   . Pressure ulcer of foot    left  . Sepsis ([place])   . Shortness of breath dyspnea   . Sleep apnea    "recently dx'd; haven't got my equipment yet" (11/08/2014)    Past Surgical History:  Procedure Laterality Date  . ABDOMINAL HERNIA REPAIR  2002  . APPENDECTOMY  1995  . CHOLECYSTECTOMY  1995  . TOTAL ABDOMINAL HYSTERECTOMY  1994    Social History   Social History  . Marital status: Single    Spouse name: N/A  . Number of children: 0  . Years of education: college-1   Occupational History  . Disabled     severe depression and morbid obesity    Social History Main Topics  . Smoking status: Former Smoker    Packs/day: 1.00    Years: 30.00    Types: Cigarettes    Quit date: 01/05/2009  . Smokeless tobacco: Never Used  . Alcohol use No  . Drug use: No  . Sexual activity: No   Other Topics Concern  . Not on file   Social History Narrative   Lives alone; has one brother.  Both of them live in poverty.  Brother does all her shopping and takes her to doctors apts.  Otherwise patient is confined to her home.      Patient does not drink caffeine.   Patient is right handed.   Family History  Problem Relation Age of Onset  . Stroke Mother   . Dementia Father   . Prostate cancer Father   . Congestive Heart Failure Brother   . Diabetes Brother   . Hypertension Brother       VITAL SIGNS BP 122/63   Pulse 80   Temp 97 F (36.1 C) (Oral)   Resp 19   Ht 5\' 2"  (1.575 m)   Wt (!) 363 lb (164.7 kg)   SpO2 98%  BMI 66.39 kg/m   Patient's Medications  New Prescriptions   No medications on file  Previous Medications   ARIPIPRAZOLE (ABILIFY) 2 MG TABLET    take 1 tablet by mouth every other day   ASCORBIC ACID (VITAMIN C) 1000 MG TABLET    Take 3,000 mg by mouth at bedtime.    CETIRIZINE (ZYRTEC) 10 MG TABLET    Take 10 mg by mouth daily.   CYCLOBENZAPRINE (FLEXERIL) 10 MG TABLET    Take 1 tablet (10 mg total) by mouth 3 (three) times daily as needed for muscle spasms.   GLIPIZIDE (GLUCOTROL) 5 MG TABLET    take 1 tablet by mouth once daily   HYDROCERIN (EUCERIN) CREA    Apply 1 application topically 2 (two) times daily.   NYSTATIN (MYCOSTATIN) POWDER    Apply topically 2 (two) times daily. Apply to affected skin BID   OMEGA-3 FATTY ACIDS (FISH OIL) 1000 MG CAPS    Take 2 capsules (2,000 mg total) by mouth at bedtime.   OXYCODONE (ROXICODONE) 15 MG IMMEDIATE RELEASE TABLET    Take 0.5 tablets (7.5 mg total) by mouth every 6 (six) hours as needed for severe pain.   OXYGEN    Inhale into the lungs. 4l/min   POLYETHYLENE  GLYCOL (MIRALAX / GLYCOLAX) PACKET    Take 17 g by mouth at bedtime.    POTASSIUM CHLORIDE SA (K-DUR,KLOR-CON) 20 MEQ TABLET    Take 2 tablets (40 mEq total) by mouth daily.   PRAVASTATIN (PRAVACHOL) 40 MG TABLET    Take 1 tablet (40 mg total) by mouth at bedtime.   PREGABALIN (LYRICA) 150 MG CAPSULE    Take 1 capsule (150 mg total) by mouth 2 (two) times daily.   SODIUM CHLORIDE FLUSH (NORMAL SALINE FLUSH) 0.9 % SOLN    Inject into the vein. 5ml IV every 12 hrs   TORSEMIDE (DEMADEX) 20 MG TABLET    take 2 tablets by mouth twice a day   TRAZODONE (DESYREL) 150 MG TABLET    Take 2 tablets (300 mg total) by mouth at bedtime.   VANCOMYCIN (VANCOCIN) 750-5 MG/150ML-% SOLN    Inject 150 mLs (750 mg total) into the vein every 12 (twelve) hours. Start 08/22/16 at 2200, last dose 08/31/16 at 2200   VENLAFAXINE XR (EFFEXOR-XR) 150 MG 24 HR CAPSULE    Take 2 capsules (300 mg total) by mouth daily.  Modified Medications   No medications on file  Discontinued Medications   No medications on file     SIGNIFICANT DIAGNOSTIC EXAMS   LABS REVIEWED:   08-23-16: wbc 6.9; hhb 11.4; hct 35.3; mcv 91.5; plt 209; glucose 96; bun 17; creat 0.92; k+ 3.5; na++ 143; liver normal albumin 3.2   Review of Systems  Constitutional: Negative for malaise/fatigue.  Respiratory: Negative for cough and shortness of breath.   Cardiovascular: Negative for chest pain, palpitations and leg swelling.  Gastrointestinal: Negative for abdominal pain, constipation and heartburn.  Musculoskeletal: Negative for back pain, joint pain and myalgias.  Skin: Negative.   Neurological: Negative for dizziness.  Psychiatric/Behavioral: The patient is not nervous/anxious.    Physical Exam  Constitutional: No distress.  Eyes: Conjunctivae are normal.  Neck: Neck supple. No JVD present. No thyromegaly present.  Cardiovascular: Normal rate, regular rhythm and intact distal pulses.   Respiratory: Effort normal and breath sounds normal. No  respiratory distress. She has no wheezes.  GI: Soft. Bowel sounds are normal. She exhibits no distension. There is no tenderness.  Musculoskeletal: She exhibits no edema.  Able to move all extremities   Lymphadenopathy:    She has no cervical adenopathy.  Neurological: She is alert.  Skin: Skin is warm and dry. She is not diaphoretic.  Psychiatric: She has a normal mood and affect.      ASSESSMENT/ PLAN:   Will discharge her to SNF; will not need any dme or home health. She will follow up with the medical provider at the receiving facility. Prescription written for #30 lyrica 150 mg tabs; #20 oxycodone 15 mg tabs.    Time spent with patient  35  minutes >50% time spent counseling; reviewing medical record; tests; labs; and developing future plan of care   Ok Edwards NP [facility]  Contact [phone] Monday through Friday 8am- 5pm  After hours call [phone]

## 2016-08-24 LAB — CULTURE, BLOOD (ROUTINE X 2)
CULTURE: NO GROWTH
Culture: NO GROWTH

## 2016-08-26 NOTE — Telephone Encounter (Signed)
Patient is scheduled for an appt on 08-27-16 with PCP and can talk with her and CSW then. Rhiley Tarver,CMA

## 2016-08-27 ENCOUNTER — Ambulatory Visit: Payer: Medicare Other | Admitting: Internal Medicine

## 2016-08-28 ENCOUNTER — Encounter: Payer: Self-pay | Admitting: Licensed Clinical Social Worker

## 2016-08-28 NOTE — Progress Notes (Signed)
Patient ID: [name], female   DOB: 1961/02/25, 55 y.o.   MRN: [id number]  Patient did not show for her follow up office visit on 08/28/16 with MD.  [name] spoke with MD about social work consultation in reference to patient's concerns about wanting to go to a new SNF.   Plan: Dr. Brett Albino will call patient and consult with [name] if needed as it relates to her SNF concerns.  Casimer Lanius, [name] Licensed Clinical Social Worker [facility]   [phone] 3:02 PM

## 2016-08-29 ENCOUNTER — Telehealth: Payer: Self-pay | Admitting: Internal Medicine

## 2016-08-29 NOTE — Telephone Encounter (Signed)
Called patient back but it went to voicemail. Will be in clinic tomorrow morning and will try to call patient back at that time.

## 2016-08-29 NOTE — Telephone Encounter (Signed)
Will forward to MD. Hortensia Duffin,CMA  

## 2016-08-29 NOTE — Telephone Encounter (Signed)
Pt just saw dr at [facility]. She would like to touch base with dr Brett Albino to let her know what he is saying. Please call on her cell phone

## 2016-09-02 DIAGNOSIS — M545 Low back pain: Secondary | ICD-10-CM | POA: Diagnosis not present

## 2016-09-02 DIAGNOSIS — M25569 Pain in unspecified knee: Secondary | ICD-10-CM | POA: Diagnosis not present

## 2016-09-04 ENCOUNTER — Ambulatory Visit (HOSPITAL_BASED_OUTPATIENT_CLINIC_OR_DEPARTMENT_OTHER): Payer: Medicare Other

## 2016-09-09 ENCOUNTER — Encounter: Payer: Self-pay | Admitting: Adult Health

## 2016-09-12 DIAGNOSIS — I872 Venous insufficiency (chronic) (peripheral): Secondary | ICD-10-CM | POA: Diagnosis not present

## 2016-09-12 DIAGNOSIS — I890 Lymphedema, not elsewhere classified: Secondary | ICD-10-CM | POA: Diagnosis not present

## 2016-09-12 DIAGNOSIS — M25512 Pain in left shoulder: Secondary | ICD-10-CM | POA: Diagnosis not present

## 2016-09-30 DIAGNOSIS — G894 Chronic pain syndrome: Secondary | ICD-10-CM | POA: Diagnosis not present

## 2016-09-30 DIAGNOSIS — M545 Low back pain: Secondary | ICD-10-CM | POA: Diagnosis not present

## 2016-09-30 DIAGNOSIS — M25569 Pain in unspecified knee: Secondary | ICD-10-CM | POA: Diagnosis not present

## 2016-10-14 ENCOUNTER — Telehealth: Payer: Self-pay | Admitting: Internal Medicine

## 2016-10-14 ENCOUNTER — Inpatient Hospital Stay (HOSPITAL_COMMUNITY)
Admission: EM | Admit: 2016-10-14 | Discharge: 2016-10-21 | DRG: 189 | Disposition: A | Discharge status: Skilled Nursing Facility | Payer: Medicare Other | Attending: Family Medicine | Admitting: Family Medicine

## 2016-10-14 ENCOUNTER — Encounter (HOSPITAL_COMMUNITY): Payer: Self-pay

## 2016-10-14 ENCOUNTER — Emergency Department (HOSPITAL_COMMUNITY): Payer: Medicare Other

## 2016-10-14 DIAGNOSIS — R05 Cough: Secondary | ICD-10-CM | POA: Diagnosis not present

## 2016-10-14 DIAGNOSIS — E1140 Type 2 diabetes mellitus with diabetic neuropathy, unspecified: Secondary | ICD-10-CM | POA: Diagnosis not present

## 2016-10-14 DIAGNOSIS — Z515 Encounter for palliative care: Secondary | ICD-10-CM

## 2016-10-14 DIAGNOSIS — E662 Morbid (severe) obesity with alveolar hypoventilation: Secondary | ICD-10-CM | POA: Diagnosis present

## 2016-10-14 DIAGNOSIS — M25562 Pain in left knee: Secondary | ICD-10-CM

## 2016-10-14 DIAGNOSIS — I110 Hypertensive heart disease with heart failure: Secondary | ICD-10-CM | POA: Diagnosis present

## 2016-10-14 DIAGNOSIS — R262 Difficulty in walking, not elsewhere classified: Secondary | ICD-10-CM | POA: Diagnosis present

## 2016-10-14 DIAGNOSIS — M797 Fibromyalgia: Secondary | ICD-10-CM | POA: Diagnosis present

## 2016-10-14 DIAGNOSIS — I4510 Unspecified right bundle-branch block: Secondary | ICD-10-CM | POA: Diagnosis present

## 2016-10-14 DIAGNOSIS — Z66 Do not resuscitate: Secondary | ICD-10-CM | POA: Diagnosis not present

## 2016-10-14 DIAGNOSIS — R0902 Hypoxemia: Secondary | ICD-10-CM | POA: Diagnosis present

## 2016-10-14 DIAGNOSIS — Z9119 Patient's noncompliance with other medical treatment and regimen: Secondary | ICD-10-CM

## 2016-10-14 DIAGNOSIS — E872 Acidosis: Secondary | ICD-10-CM | POA: Diagnosis not present

## 2016-10-14 DIAGNOSIS — G4700 Insomnia, unspecified: Secondary | ICD-10-CM

## 2016-10-14 DIAGNOSIS — I890 Lymphedema, not elsewhere classified: Secondary | ICD-10-CM | POA: Diagnosis present

## 2016-10-14 DIAGNOSIS — M25561 Pain in right knee: Secondary | ICD-10-CM

## 2016-10-14 DIAGNOSIS — Z7189 Other specified counseling: Secondary | ICD-10-CM

## 2016-10-14 DIAGNOSIS — G4733 Obstructive sleep apnea (adult) (pediatric): Secondary | ICD-10-CM | POA: Diagnosis not present

## 2016-10-14 DIAGNOSIS — J9601 Acute respiratory failure with hypoxia: Secondary | ICD-10-CM | POA: Diagnosis not present

## 2016-10-14 DIAGNOSIS — I878 Other specified disorders of veins: Secondary | ICD-10-CM | POA: Diagnosis not present

## 2016-10-14 DIAGNOSIS — R531 Weakness: Secondary | ICD-10-CM

## 2016-10-14 DIAGNOSIS — J9622 Acute and chronic respiratory failure with hypercapnia: Secondary | ICD-10-CM | POA: Diagnosis present

## 2016-10-14 DIAGNOSIS — G8929 Other chronic pain: Secondary | ICD-10-CM

## 2016-10-14 DIAGNOSIS — I872 Venous insufficiency (chronic) (peripheral): Secondary | ICD-10-CM | POA: Diagnosis present

## 2016-10-14 DIAGNOSIS — I6789 Other cerebrovascular disease: Secondary | ICD-10-CM | POA: Diagnosis not present

## 2016-10-14 DIAGNOSIS — J9602 Acute respiratory failure with hypercapnia: Secondary | ICD-10-CM | POA: Diagnosis present

## 2016-10-14 DIAGNOSIS — Z6841 Body Mass Index (BMI) 40.0 and over, adult: Secondary | ICD-10-CM

## 2016-10-14 DIAGNOSIS — M1990 Unspecified osteoarthritis, unspecified site: Secondary | ICD-10-CM | POA: Diagnosis not present

## 2016-10-14 DIAGNOSIS — E785 Hyperlipidemia, unspecified: Secondary | ICD-10-CM | POA: Diagnosis present

## 2016-10-14 DIAGNOSIS — R601 Generalized edema: Secondary | ICD-10-CM | POA: Diagnosis not present

## 2016-10-14 DIAGNOSIS — Z87891 Personal history of nicotine dependence: Secondary | ICD-10-CM

## 2016-10-14 DIAGNOSIS — Z9981 Dependence on supplemental oxygen: Secondary | ICD-10-CM

## 2016-10-14 DIAGNOSIS — Z886 Allergy status to analgesic agent status: Secondary | ICD-10-CM

## 2016-10-14 DIAGNOSIS — Z7984 Long term (current) use of oral hypoglycemic drugs: Secondary | ICD-10-CM

## 2016-10-14 DIAGNOSIS — D696 Thrombocytopenia, unspecified: Secondary | ICD-10-CM | POA: Diagnosis present

## 2016-10-14 DIAGNOSIS — D649 Anemia, unspecified: Secondary | ICD-10-CM | POA: Diagnosis not present

## 2016-10-14 DIAGNOSIS — I5032 Chronic diastolic (congestive) heart failure: Secondary | ICD-10-CM | POA: Diagnosis not present

## 2016-10-14 DIAGNOSIS — J449 Chronic obstructive pulmonary disease, unspecified: Secondary | ICD-10-CM | POA: Diagnosis not present

## 2016-10-14 DIAGNOSIS — E1151 Type 2 diabetes mellitus with diabetic peripheral angiopathy without gangrene: Secondary | ICD-10-CM | POA: Diagnosis not present

## 2016-10-14 DIAGNOSIS — R059 Cough, unspecified: Secondary | ICD-10-CM

## 2016-10-14 DIAGNOSIS — J9621 Acute and chronic respiratory failure with hypoxia: Secondary | ICD-10-CM | POA: Diagnosis not present

## 2016-10-14 LAB — I-STAT ARTERIAL BLOOD GAS, ED
ACID-BASE EXCESS: 17 mmol/L — AB (ref 0.0–2.0)
Acid-Base Excess: 17 mmol/L — ABNORMAL HIGH (ref 0.0–2.0)
Acid-Base Excess: 18 mmol/L — ABNORMAL HIGH (ref 0.0–2.0)
Acid-Base Excess: 19 mmol/L — ABNORMAL HIGH (ref 0.0–2.0)
BICARBONATE: 46.4 mmol/L — AB (ref 20.0–28.0)
BICARBONATE: 45.6 mmol/L — AB (ref 20.0–28.0)
BICARBONATE: 46.7 mmol/L — AB (ref 20.0–28.0)
Bicarbonate: 45.8 mmol/L — ABNORMAL HIGH (ref 20.0–28.0)
O2 SAT: 93 %
O2 SAT: 95 %
O2 Saturation: 92 %
O2 Saturation: 91 %
PCO2 ART: 84.3 mmHg — AB (ref 32.0–48.0)
PCO2 ART: 72.7 mmHg — AB (ref 32.0–48.0)
PH ART: 7.347 — AB (ref 7.350–7.450)
PH ART: 7.404 (ref 7.350–7.450)
PH ART: 7.416 (ref 7.350–7.450)
PO2 ART: 76 mmHg — AB (ref 83.0–108.0)
PO2 ART: 82 mmHg — AB (ref 83.0–108.0)
PO2 ART: 68 mmHg — AB (ref 83.0–108.0)
PO2 ART: 63 mmHg — AB (ref 83.0–108.0)
Patient temperature: 98.1
Patient temperature: 98.1
Patient temperature: 98.6
TCO2: 48 mmol/L (ref 0–100)
TCO2: 49 mmol/L (ref 0–100)
TCO2: 48 mmol/L (ref 0–100)
TCO2: 49 mmol/L (ref 0–100)
pCO2 arterial: 84.4 mmHg (ref 32.0–48.0)
pCO2 arterial: 73 mmHg (ref 32.0–48.0)
pH, Arterial: 7.341 — ABNORMAL LOW (ref 7.350–7.450)

## 2016-10-14 LAB — HEPATIC FUNCTION PANEL
ALBUMIN: 3.3 g/dL — AB (ref 3.5–5.0)
ALT: 15 U/L (ref 14–54)
AST: 20 U/L (ref 15–41)
Alkaline Phosphatase: 77 U/L (ref 38–126)
BILIRUBIN TOTAL: 0.4 mg/dL (ref 0.3–1.2)
Bilirubin, Direct: 0.2 mg/dL (ref 0.1–0.5)
Indirect Bilirubin: 0.2 mg/dL — ABNORMAL LOW (ref 0.3–0.9)
TOTAL PROTEIN: 6.4 g/dL — AB (ref 6.5–8.1)

## 2016-10-14 LAB — URINALYSIS, ROUTINE W REFLEX MICROSCOPIC
Bilirubin Urine: NEGATIVE
Glucose, UA: NEGATIVE mg/dL
Hgb urine dipstick: NEGATIVE
KETONES UR: 15 mg/dL — AB
LEUKOCYTES UA: NEGATIVE
NITRITE: NEGATIVE
PH: 7 (ref 5.0–8.0)
Protein, ur: NEGATIVE mg/dL
Specific Gravity, Urine: 1.013 (ref 1.005–1.030)

## 2016-10-14 LAB — RAPID URINE DRUG SCREEN, HOSP PERFORMED
AMPHETAMINES: NOT DETECTED
Barbiturates: NOT DETECTED
Benzodiazepines: NOT DETECTED
Cocaine: NOT DETECTED
Opiates: NOT DETECTED
TETRAHYDROCANNABINOL: NOT DETECTED

## 2016-10-14 LAB — CBC WITH DIFFERENTIAL/PLATELET
BASOS PCT: 0 %
Basophils Absolute: 0 10*3/uL (ref 0.0–0.1)
EOS ABS: 0 10*3/uL (ref 0.0–0.7)
EOS PCT: 1 %
HCT: 37.7 % (ref 36.0–46.0)
Hemoglobin: 10.8 g/dL — ABNORMAL LOW (ref 12.0–15.0)
Lymphocytes Relative: 22 %
Lymphs Abs: 1 10*3/uL (ref 0.7–4.0)
MCH: 28.3 pg (ref 26.0–34.0)
MCHC: 28.6 g/dL — AB (ref 30.0–36.0)
MCV: 98.7 fL (ref 78.0–100.0)
MONO ABS: 0.4 10*3/uL (ref 0.1–1.0)
MONOS PCT: 9 %
Neutro Abs: 3 10*3/uL (ref 1.7–7.7)
Neutrophils Relative %: 68 %
Platelets: 156 10*3/uL (ref 150–400)
RBC: 3.82 MIL/uL — ABNORMAL LOW (ref 3.87–5.11)
RDW: 17 % — AB (ref 11.5–15.5)
WBC: 4.3 10*3/uL (ref 4.0–10.5)

## 2016-10-14 LAB — BASIC METABOLIC PANEL
Anion gap: 9 (ref 5–15)
BUN: 13 mg/dL (ref 6–20)
CALCIUM: 9.2 mg/dL (ref 8.9–10.3)
CO2: 39 mmol/L — ABNORMAL HIGH (ref 22–32)
CREATININE: 0.86 mg/dL (ref 0.44–1.00)
Chloride: 95 mmol/L — ABNORMAL LOW (ref 101–111)
GFR calc non Af Amer: >60 mL/min (ref >60)
Glucose, Bld: 83 mg/dL (ref 65–99)
Potassium: 3.8 mmol/L (ref 3.5–5.1)
SODIUM: 143 mmol/L (ref 135–145)

## 2016-10-14 LAB — TSH: TSH: 3.089 u[IU]/mL (ref 0.350–4.500)

## 2016-10-14 LAB — CBC
HCT: 35.6 % — ABNORMAL LOW (ref 36.0–46.0)
Hemoglobin: 10.4 g/dL — ABNORMAL LOW (ref 12.0–15.0)
MCH: 28.8 pg (ref 26.0–34.0)
MCHC: 29.2 g/dL — AB (ref 30.0–36.0)
MCV: 98.6 fL (ref 78.0–100.0)
PLATELETS: 133 10*3/uL — AB (ref 150–400)
RBC: 3.61 MIL/uL — ABNORMAL LOW (ref 3.87–5.11)
RDW: 17 % — AB (ref 11.5–15.5)
WBC: 4.3 10*3/uL (ref 4.0–10.5)

## 2016-10-14 LAB — TROPONIN I: Troponin I: <0.03 ng/mL (ref <0.03)

## 2016-10-14 LAB — BRAIN NATRIURETIC PEPTIDE: B NATRIURETIC PEPTIDE 5: 17.2 pg/mL (ref 0.0–100.0)

## 2016-10-14 LAB — CREATININE, SERUM: Creatinine, Ser: 0.85 mg/dL (ref 0.44–1.00)

## 2016-10-14 MED ORDER — LORATADINE 10 MG PO TABS
10.0000 mg | ORAL_TABLET | Freq: Every day | ORAL | Status: DC
  Administered 2016-10-14 – 2016-10-21 (×8): 10 mg via ORAL
  Filled 2016-10-14 (×8): qty 1

## 2016-10-14 MED ORDER — SODIUM CHLORIDE 0.9 % IV SOLN: 250.0000 mL | INTRAVENOUS | Status: DC | PRN

## 2016-10-14 MED ORDER — ALBUTEROL SULFATE HFA 108 (90 BASE) MCG/ACT IN AERS: 2.0000 | INHALATION_SPRAY | RESPIRATORY_TRACT | Status: DC | PRN

## 2016-10-14 MED ORDER — NYSTATIN 100000 UNIT/GM EX POWD
1.0000 g | Freq: Two times a day (BID) | CUTANEOUS | Status: DC | PRN
  Administered 2016-10-20: 1 g via TOPICAL
  Filled 2016-10-14: qty 15

## 2016-10-14 MED ORDER — PRAVASTATIN SODIUM 40 MG PO TABS
40.0000 mg | ORAL_TABLET | Freq: Every day | ORAL | Status: DC
  Administered 2016-10-14 – 2016-10-20 (×7): 40 mg via ORAL
  Filled 2016-10-14 (×7): qty 1

## 2016-10-14 MED ORDER — IPRATROPIUM-ALBUTEROL 0.5-2.5 (3) MG/3ML IN SOLN
3.0000 mL | Freq: Four times a day (QID) | RESPIRATORY_TRACT | Status: DC
  Administered 2016-10-14 – 2016-10-17 (×11): 3 mL via RESPIRATORY_TRACT
  Filled 2016-10-14 (×12): qty 3

## 2016-10-14 MED ORDER — NYSTATIN 100000 UNIT/GM EX POWD
Freq: Once | CUTANEOUS | Status: AC
  Administered 2016-10-14: 07:00:00 via TOPICAL
  Filled 2016-10-14: qty 15

## 2016-10-14 MED ORDER — ENOXAPARIN SODIUM 40 MG/0.4ML ~~LOC~~ SOLN
40.0000 mg | SUBCUTANEOUS | Status: DC
  Administered 2016-10-15: 40 mg via SUBCUTANEOUS
  Filled 2016-10-14 (×2): qty 0.4

## 2016-10-14 MED ORDER — SODIUM CHLORIDE 0.9% FLUSH
3.0000 mL | Freq: Two times a day (BID) | INTRAVENOUS | Status: DC
  Administered 2016-10-14 – 2016-10-20 (×5): 3 mL via INTRAVENOUS

## 2016-10-14 MED ORDER — CYCLOBENZAPRINE HCL 10 MG PO TABS: 10.0000 mg | ORAL_TABLET | Freq: Three times a day (TID) | ORAL | Status: DC | PRN

## 2016-10-14 MED ORDER — FUROSEMIDE 10 MG/ML IJ SOLN
40.0000 mg | Freq: Two times a day (BID) | INTRAMUSCULAR | Status: DC
  Administered 2016-10-14 – 2016-10-17 (×6): 40 mg via INTRAVENOUS
  Filled 2016-10-14 (×7): qty 4

## 2016-10-14 MED ORDER — ACETAMINOPHEN 325 MG PO TABS
650.0000 mg | ORAL_TABLET | Freq: Four times a day (QID) | ORAL | Status: DC | PRN
  Administered 2016-10-14 – 2016-10-16 (×4): 650 mg via ORAL
  Filled 2016-10-14 (×4): qty 2

## 2016-10-14 MED ORDER — SODIUM CHLORIDE 0.9% FLUSH
3.0000 mL | Freq: Two times a day (BID) | INTRAVENOUS | Status: DC
  Administered 2016-10-14 – 2016-10-20 (×11): 3 mL via INTRAVENOUS

## 2016-10-14 MED ORDER — PREGABALIN 75 MG PO CAPS
150.0000 mg | ORAL_CAPSULE | Freq: Two times a day (BID) | ORAL | Status: DC
  Administered 2016-10-14 – 2016-10-21 (×14): 150 mg via ORAL
  Filled 2016-10-14: qty 3
  Filled 2016-10-14 (×13): qty 2

## 2016-10-14 MED ORDER — HYDROCERIN EX CREA
1.0000 "application " | TOPICAL_CREAM | Freq: Two times a day (BID) | CUTANEOUS | Status: DC
  Administered 2016-10-15 – 2016-10-21 (×10): 1 via TOPICAL
  Filled 2016-10-14 (×2): qty 113

## 2016-10-14 MED ORDER — METOLAZONE 5 MG PO TABS
5.0000 mg | ORAL_TABLET | Freq: Once | ORAL | Status: AC
  Administered 2016-10-14: 5 mg via ORAL
  Filled 2016-10-14: qty 1

## 2016-10-14 MED ORDER — ONDANSETRON HCL 4 MG/2ML IJ SOLN: 4.0000 mg | Freq: Four times a day (QID) | INTRAMUSCULAR | Status: DC | PRN

## 2016-10-14 MED ORDER — KETOROLAC TROMETHAMINE 30 MG/ML IJ SOLN
30.0000 mg | Freq: Once | INTRAMUSCULAR | Status: AC
  Administered 2016-10-14: 30 mg via INTRAVENOUS
  Filled 2016-10-14: qty 1

## 2016-10-14 MED ORDER — TIOTROPIUM BROMIDE MONOHYDRATE 18 MCG IN CAPS
18.0000 ug | ORAL_CAPSULE | Freq: Every day | RESPIRATORY_TRACT | Status: DC
  Administered 2016-10-15 – 2016-10-20 (×6): 18 ug via RESPIRATORY_TRACT
  Filled 2016-10-14 (×2): qty 5

## 2016-10-14 MED ORDER — VENLAFAXINE HCL ER 150 MG PO CP24
300.0000 mg | ORAL_CAPSULE | Freq: Every day | ORAL | Status: DC
  Administered 2016-10-14 – 2016-10-21 (×8): 300 mg via ORAL
  Filled 2016-10-14 (×9): qty 2

## 2016-10-14 MED ORDER — CYCLOBENZAPRINE HCL 5 MG PO TABS
5.0000 mg | ORAL_TABLET | Freq: Three times a day (TID) | ORAL | Status: DC | PRN
  Administered 2016-10-14 – 2016-10-21 (×8): 5 mg via ORAL
  Filled 2016-10-14 (×8): qty 1

## 2016-10-14 MED ORDER — SODIUM CHLORIDE 0.9% FLUSH
3.0000 mL | INTRAVENOUS | Status: DC | PRN
  Administered 2016-10-16: 3 mL via INTRAVENOUS
  Filled 2016-10-14 (×2): qty 3

## 2016-10-14 MED ORDER — POLYETHYLENE GLYCOL 3350 17 G PO PACK
17.0000 g | PACK | Freq: Every day | ORAL | Status: DC | PRN
  Administered 2016-10-18: 17 g via ORAL
  Filled 2016-10-14: qty 1

## 2016-10-14 MED ORDER — ACETAMINOPHEN 650 MG RE SUPP: 650.0000 mg | Freq: Four times a day (QID) | RECTAL | Status: DC | PRN

## 2016-10-14 MED ORDER — TORSEMIDE 20 MG PO TABS: 40.0000 mg | ORAL_TABLET | Freq: Two times a day (BID) | ORAL | Status: DC

## 2016-10-14 MED ORDER — ONDANSETRON HCL 4 MG PO TABS
4.0000 mg | ORAL_TABLET | Freq: Four times a day (QID) | ORAL | Status: DC | PRN
Start: 2016-10-14 — End: 2016-10-22
  Administered 2016-10-19 – 2016-10-21 (×3): 4 mg via ORAL
  Filled 2016-10-14 (×3): qty 1

## 2016-10-14 MED ORDER — ARIPIPRAZOLE 2 MG PO TABS
2.0000 mg | ORAL_TABLET | ORAL | Status: DC
  Administered 2016-10-14 – 2016-10-20 (×4): 2 mg via ORAL
  Filled 2016-10-14 (×5): qty 1

## 2016-10-14 NOTE — Telephone Encounter (Signed)
Called patient's brother to discuss Pt's recent admission. Pt's brother will call us back with any additional questions/concerns.

## 2016-10-14 NOTE — ED Notes (Signed)
Ordered Reg [employer]

## 2016-10-14 NOTE — ED Provider Notes (Signed)
[facility] DEPT Provider Note   CSN: [id number] Arrival date & time: 10/14/16  0430     History   Chief Complaint Chief Complaint  Patient presents with  . Difficulty Walking    HPI Kathleen Roy is a 55 y.o. female.  HPI  This is a 55 year old with multiple medical comorbidities and morbidly obese who presents from home. Recent admission for bacteremia and cellulitis. Was discharged to rehabilitation. Kathleen Roy stayed at [facility] for 2 months. Patient reports that Kathleen Roy was discharged on Saturday to home. Kathleen Roy states "they fed me to much and I gained weight."  Kathleen Roy states that when Kathleen Roy got home Kathleen Roy had to call EMS 4 times to maneuver into her house and to the bathroom within her house. Kathleen Roy reports that Kathleen Roy does not feel safe at home. Kathleen Roy states that Kathleen Roy is unable to ambulate independently. Kathleen Roy states that while Kathleen Roy was at [facility] Kathleen Roy transitioned to using a walker but "I had wide-open spaces and no carpet."  At baseline Kathleen Roy is on home oxygen. Reports chronic lower extremity swelling. Currently in Unna boots. Reports bilateral knee pain which is unchanged as well as back pain. Denies chest pain, shortness breath, fevers.  Patient is unsure whether Kathleen Roy got discharged with home health. Kathleen Roy does state that Kathleen Roy is supposed to have equipment delivered later today.  Past Medical History:  Diagnosis Date  . Arthritis    "I feel like it's everywhere" (11/08/2014)  . Cellulitis and abscess of lower extremity 08/16/2016   right leg  . Chronic lower back pain   . COPD (chronic obstructive pulmonary disease) ([place])   . Depression   . Diabetes mellitus without complication ([place])    type 2   . Fibromyalgia   . Headache    "at least weekly; it's usually when I first wake up" (11/08/2014)  . Hyperlipidemia   . Hypertension   . HYPERTENSION, BENIGN SYSTEMIC 02/12/2007  . Memory disturbance   . Migraines 1970's - <2000   "they just went away"  . Neuropathy ([place])   . Normal echocardiogram  05/30/05   suboptimal study  . Obesity, morbid (more than 100 lbs over ideal weight or BMI > 40) (HCC)    obese since childhood  . Osteoarthritis   . Peripheral edema   . Pressure ulcer of foot    left  . Sepsis ([place])   . Shortness of breath dyspnea   . Sleep apnea    "recently dx'd; haven't got my equipment yet" (11/08/2014)    Patient Active Problem List   Diagnosis Date Noted  . Bacteremia   . Cellulitis of right lower extremity 06/04/2016  . Type 2 diabetes mellitus ([place]) 02/14/2016  . Pressure ulcer 01/16/2016  . Acute on chronic respiratory failure with hypercapnia ([place])   . Chronic diastolic congestive heart failure ([place])   . Essential hypertension   . CAP (community acquired pneumonia) 01/15/2016  . Bilateral knee pain 03/22/2015  . OSA (obstructive sleep apnea)   . Obesity hypoventilation syndrome ([place])   . Chronic respiratory failure with hypoxia ([place])   . Assistance needed for grooming 11/08/2014  . Fatigue 11/03/2014  . Memory loss 03/18/2014  . Tremor 03/18/2014  . Vulvovaginal candidiasis 11/25/2013  . Restrictive airway disease 08/26/2013  . Hypokalemia 08/19/2013  . Health care maintenance 06/17/2013  . Venous stasis dermatitis of both lower extremities 12/02/2012  . COPD (chronic obstructive pulmonary disease) ([place]) 10/15/2011  . Peripheral neuropathy ([employer]) 10/15/2011  . KNEE PAIN, BILATERAL  01/22/2011  . BACK PAIN, LUMBAR, CHRONIC 01/22/2011  . MIGRAINE HEADACHE 10/25/2010  . HYPERLIPIDEMIA 04/07/2009  . EDEMA, CHRONIC 06/27/2008  . Morbid obesity ([place]) 11/16/2007  . DEPRESSION, MAJOR, RECURRENT 02/12/2007  . PANIC ATTACKS 02/12/2007  . HYPERTENSION, BENIGN SYSTEMIC 02/12/2007    Past Surgical History:  Procedure Laterality Date  . ABDOMINAL HERNIA REPAIR  2002  . APPENDECTOMY  1995  . CHOLECYSTECTOMY  1995  . TOTAL ABDOMINAL HYSTERECTOMY  1994    OB History    Gravida Para Term Preterm AB Living   0 0 0 0 0 0   SAB TAB Ectopic Multiple  Live Births   0 0 0 0         Home Medications    Prior to Admission medications   Medication Sig Start Date End Date Taking? Authorizing Provider  ARIPiprazole (ABILIFY) 2 MG tablet take 1 tablet by mouth every other day 03/08/16   Sela Hua, MD  Ascorbic Acid (VITAMIN C) 1000 MG tablet Take 3,000 mg by mouth at bedtime.     Historical Provider, MD  cetirizine (ZYRTEC) 10 MG tablet Take 10 mg by mouth daily.    Historical Provider, MD  cyclobenzaprine (FLEXERIL) 10 MG tablet Take 1 tablet (10 mg total) by mouth 3 (three) times daily as needed for muscle spasms. 06/17/16   Sela Hua, MD  glipiZIDE (GLUCOTROL) 5 MG tablet take 1 tablet by mouth once daily 06/28/16   Sela Hua, MD  hydrocerin (EUCERIN) CREA Apply 1 application topically 2 (two) times daily. 08/22/16   Steve Rattler, DO  nystatin (MYCOSTATIN) powder Apply topically 2 (two) times daily. Apply to affected skin BID Patient taking differently: Apply 1 g topically 2 (two) times daily as needed (yeast). Apply to affected skin BID 10/26/15   [name], MD  Omega-3 Fatty Acids (FISH OIL) 1000 MG CAPS Take 2 capsules (2,000 mg total) by mouth at bedtime. 06/10/16   Sela Hua, MD  oxyCODONE (ROXICODONE) 15 MG immediate release tablet Take 0.5 tablets (7.5 mg total) by mouth every 6 (six) hours as needed for severe pain. 08/22/16   Steve Rattler, DO  OXYGEN Inhale into the lungs. 4l/min    Historical Provider, MD  polyethylene glycol (MIRALAX / GLYCOLAX) packet Take 17 g by mouth at bedtime.     Historical Provider, MD  potassium chloride SA (K-DUR,KLOR-CON) 20 MEQ tablet Take 2 tablets (40 mEq total) by mouth daily. 06/04/16   Sela Hua, MD  pravastatin (PRAVACHOL) 40 MG tablet Take 1 tablet (40 mg total) by mouth at bedtime. 04/02/16   Sela Hua, MD  pregabalin (LYRICA) 150 MG capsule Take 1 capsule (150 mg total) by mouth 2 (two) times daily. 08/22/16   Steve Rattler, DO  Sodium Chloride Flush (NORMAL  SALINE FLUSH) 0.9 % SOLN Inject into the vein. 44ml IV every 12 hrs    Historical Provider, MD  torsemide (DEMADEX) 20 MG tablet take 2 tablets by mouth twice a day 03/08/16   Sela Hua, MD  traZODone (DESYREL) 150 MG tablet Take 2 tablets (300 mg total) by mouth at bedtime. 04/02/16   Sela Hua, MD  Vancomycin (VANCOCIN) 750-5 MG/150ML-% SOLN Inject 150 mLs (750 mg total) into the vein every 12 (twelve) hours. Start 08/22/16 at 2200, last dose 08/31/16 at 2200 08/22/16   Gardiner Rhyme Riccio, DO  venlafaxine XR (EFFEXOR-XR) 150 MG 24 hr capsule Take 2 capsules (300 mg total) by  mouth daily. 08/07/16   Sela Hua, MD    Family History Family History  Problem Relation Age of Onset  . Stroke Mother   . Dementia Father   . Prostate cancer Father   . Congestive Heart Failure Brother   . Diabetes Brother   . Hypertension Brother     Social History Social History  Substance Use Topics  . Smoking status: Former Smoker    Packs/day: 1.00    Years: 30.00    Types: Cigarettes    Quit date: 01/05/2009  . Smokeless tobacco: Never Used  . Alcohol use No     Allergies   Aspirin   Review of Systems Review of Systems  Constitutional: Negative for fever.  Respiratory: Negative for shortness of breath.   Cardiovascular: Negative for chest pain.  Gastrointestinal: Negative for abdominal pain, nausea and vomiting.  Musculoskeletal: Positive for arthralgias and back pain.  Skin: Positive for color change and rash. Negative for wound.  Neurological: Positive for weakness.  All other systems reviewed and are negative.    Physical Exam Updated Vital Signs BP 127/70   Pulse 86   Resp 15   SpO2 95%   Physical Exam  Constitutional: Kathleen Roy is oriented to person, place, and time.  Chronically ill-appearing, morbidly obese  HENT:  Head: Normocephalic and atraumatic.  Cardiovascular: Normal rate, regular rhythm and normal heart sounds.   Pulmonary/Chest: Effort normal. No respiratory  distress.  Nasal cannula in place, limited exam secondary to body habitus  Abdominal: Soft. Bowel sounds are normal. There is no tenderness.  Anasarca of the lower abdomen, no tenderness  Musculoskeletal:  2+ bilateral lower extremity edema  Neurological: Kathleen Roy is alert and oriented to person, place, and time.  Skin: Skin is warm and dry.  Erythema under the pannus with weeping Erythema extending superior from the Unna boots with mild warmth  Psychiatric: Kathleen Roy has a normal mood and affect.  Nursing note and vitals reviewed.    ED Treatments / Results  Labs (all labs ordered are listed, but only abnormal results are displayed) Labs Reviewed  CBC WITH DIFFERENTIAL/PLATELET - Abnormal; Notable for the following:       Result Value   RBC 3.82 (*)    Hemoglobin 10.8 (*)    MCHC 28.6 (*)    RDW 17.0 (*)    All other components within normal limits  BASIC METABOLIC PANEL - Abnormal; Notable for the following:    Chloride 95 (*)    CO2 39 (*)    All other components within normal limits    EKG  EKG Interpretation None       Radiology No results found.  Procedures Procedures (including critical care time)  Medications Ordered in ED Medications  nystatin (MYCOSTATIN/NYSTOP) topical powder ( Topical Given 10/14/16 0640)     Initial Impression / Assessment and Plan / ED Course  I have reviewed the triage vital signs and the nursing notes.  Pertinent labs & imaging results that were available during my care of the patient were reviewed by me and considered in my medical decision making (see chart for details).  Clinical Course    Patient presents with difficulty walking. Recent discharge from rehabilitation. Apparent home is not set up for patients health needs. Kathleen Roy reports that Kathleen Roy is otherwise at her baseline. Kathleen Roy has chronic lower extremity edema and erythema. No signs or symptoms of no infection. Kathleen Roy does have a yeast infection. Basic labwork reassuring. We'll have case  management evaluate the  patient for placement versus home health.  Final Clinical Impressions(s) / ED Diagnoses   Final diagnoses:  None    New Prescriptions New Prescriptions   No medications on file     [name], MD 10/14/16 [id number]

## 2016-10-14 NOTE — ED Notes (Signed)
Patient on BIPAP, unable to take oral medications.

## 2016-10-14 NOTE — ED Notes (Signed)
Dr. Brett Albino here to see pt. Discussed varied saturations with good waveform some as low as 37%.

## 2016-10-14 NOTE — ED Provider Notes (Signed)
7:39 AM Nurse came to get me due to low O2 sats. While asleep on 4L O2 her sats are in the 60s and 70s. She is easily arousable but still with low sats. Will place on CPAP, as she is supposed to be on this but can't afford it. Will get Xray but I think this is probably obesity/OSA related. She was repositioned to sit upright in bed to help facilitate better O2 sats. Patient tells me she's "no more short of breath than usual"  9:48 AM Patient's Blood gas is mildly acidotic but mostly a compensated chronic respiratory acidosis. I think given the hypoxia, overall poor health issues and poor social support she probably needs to be brought back into hospital. Consult family practice.  10:12 AM Family practice to admit. Leave on bipap for now   Sherwood Gambler, MD 10/14/16 1012

## 2016-10-14 NOTE — ED Notes (Signed)
Paged Family Med to [place], [place]

## 2016-10-14 NOTE — ED Triage Notes (Signed)
Per EMS pt has hx of morbid obesity which she was in rehab for and released due Medicare stop paying; pt lives at home with brother who is 62 you and has health issues as well; pt has difficulty holding on body weight up; pt has called EMS x 4 times tonight to assist with going to the bathroom; Per EMS pt's house is not conducive to perform CPR on pt due to space and size of pt; EMS states pt unable to sit up on her own or sit on bedside on her own; pt on 4L O2 at home; No other complaints on arrival.

## 2016-10-14 NOTE — ED Notes (Signed)
Pt again voids to bed after stating she cannot void to urinal. Pt does not appear to know when she is voiding. Dr. Gerlean Ren contacted to inform same and question meassure of diuretic response.

## 2016-10-14 NOTE — Consult Note (Signed)
PULMONARY / CRITICAL CARE MEDICINE   Name: Kathleen Roy MRN: [id number] DOB: 1961/02/01    ADMISSION DATE:  10/14/2016 CONSULTATION DATE:  10/14/16  REFERRING MD:  Dr. Nori Riis   CHIEF COMPLAINT:  Altered Mental Status   HISTORY OF PRESENT ILLNESS:  55 y/o F, former smoker (quit 2009, 35 pk yr hx) morbidly obese female, followed by Dr. Brett Albino at [facility] with a PMH of arthritis, chronic lower extremity cellulitis in the setting of venous insufficiency, foot ulcers in una boots, depression, fibromyalgia, headaches, migraines, neuropathy, HTN, HLD, chronic diastolic CHF and COPD (not defined, spirometry in 2014 consistent with restrictive disease, she weighed 335 lbs at that time) who presented to [facility] on 10/30 with reports of altered mental status.    At baseline, the patient lives with her brother.  She recently was at a SNF for diastolic CHF.  She reportedly was discharged the weekend prior to presentation due to Medicare stopping payment(?).  At home, she was unable to provide self care and her brother was unable to as well due to his own medical problems.  She called EMS 4 times overnight to go to the restroom.  EMS had concerns regarding her home living situation due to her size and ability to provide CPR if needed.  The patient was ultimately transported to the ER for assessment.  She reported on presentation that she had concerns about staying at home.  She notes increased lower extremity swelling.  In the ER, she was placed on BiPAP for low O2 sats.  ABG assessment demonstrated 7.347 / 84 / 82.  CXR assessment demonstrated no acute findings.   PCCM consulted for assessment.   PAST MEDICAL HISTORY :  She  has a past medical history of Arthritis; Cellulitis and abscess of lower extremity (08/16/2016); Chronic lower back pain; COPD (chronic obstructive pulmonary disease) ([place]); Depression; Diabetes mellitus without complication ([place]); Fibromyalgia; Headache; Hyperlipidemia; Hypertension; HYPERTENSION,  BENIGN SYSTEMIC (02/12/2007); Memory disturbance; Migraines (1970's - <2000); Neuropathy ([place]); Normal echocardiogram (05/30/05); Obesity, morbid (more than 100 lbs over ideal weight or BMI > 40) ([place]); Osteoarthritis; Peripheral edema; Pressure ulcer of foot; Sepsis ([place]); Shortness of breath dyspnea; and Sleep apnea.  PAST SURGICAL HISTORY: She  has a past surgical history that includes Total abdominal hysterectomy (1994); Abdominal hernia repair (2002); Appendectomy (1995); and Cholecystectomy (1995).  Allergies  Allergen Reactions  . Aspirin Hives    REACTION: hives Hasn't had in years     No current facility-administered medications on file prior to encounter.    Current Outpatient Prescriptions on File Prior to Encounter  Medication Sig  . ARIPiprazole (ABILIFY) 2 MG tablet take 1 tablet by mouth every other day  . Ascorbic Acid (VITAMIN C) 1000 MG tablet Take 3,000 mg by mouth at bedtime.   . cetirizine (ZYRTEC) 10 MG tablet Take 10 mg by mouth daily.  . cyclobenzaprine (FLEXERIL) 10 MG tablet Take 1 tablet (10 mg total) by mouth 3 (three) times daily as needed for muscle spasms.  Marland Kitchen glipiZIDE (GLUCOTROL) 5 MG tablet take 1 tablet by mouth once daily  . hydrocerin (EUCERIN) CREA Apply 1 application topically 2 (two) times daily.  Marland Kitchen nystatin (MYCOSTATIN) powder Apply topically 2 (two) times daily. Apply to affected skin BID (Patient taking differently: Apply 1 g topically 2 (two) times daily as needed (yeast). Apply to affected skin BID)  . Omega-3 Fatty Acids (FISH OIL) 1000 MG CAPS Take 2 capsules (2,000 mg total) by mouth at bedtime.  Marland Kitchen oxyCODONE (ROXICODONE)  15 MG immediate release tablet Take 0.5 tablets (7.5 mg total) by mouth every 6 (six) hours as needed for severe pain.  . OXYGEN Inhale into the lungs. 4l/min  . polyethylene glycol (MIRALAX / GLYCOLAX) packet Take 17 g by mouth at bedtime.   . potassium chloride SA (K-DUR,KLOR-CON) 20 MEQ tablet Take 2 tablets (40 mEq total) by  mouth daily.  . pravastatin (PRAVACHOL) 40 MG tablet Take 1 tablet (40 mg total) by mouth at bedtime.  . pregabalin (LYRICA) 150 MG capsule Take 1 capsule (150 mg total) by mouth 2 (two) times daily.  . Sodium Chloride Flush (NORMAL SALINE FLUSH) 0.9 % SOLN Inject into the vein. 2ml IV every 12 hrs  . torsemide (DEMADEX) 20 MG tablet take 2 tablets by mouth twice a day  . traZODone (DESYREL) 150 MG tablet Take 2 tablets (300 mg total) by mouth at bedtime.  . Vancomycin (VANCOCIN) 750-5 MG/150ML-% SOLN Inject 150 mLs (750 mg total) into the vein every 12 (twelve) hours. Start 08/22/16 at 2200, last dose 08/31/16 at 2200  . venlafaxine XR (EFFEXOR-XR) 150 MG 24 hr capsule Take 2 capsules (300 mg total) by mouth daily.    FAMILY HISTORY:  Her indicated that her mother is deceased. She indicated that her father is deceased. She indicated that her sister is alive. She indicated that her brother is alive.    SOCIAL HISTORY: She  reports that she quit smoking about 7 years ago. Her smoking use included Cigarettes. She has a 30.00 pack-year smoking history. She has never used smokeless tobacco. She reports that she does not drink alcohol or use drugs.  REVIEW OF SYSTEMS:  Unable to complete due to BiPAP   SUBJECTIVE:   VITAL SIGNS: BP 112/68   Pulse 94   Resp 20   SpO2 (!) 70%   HEMODYNAMICS:    VENTILATOR SETTINGS:    INTAKE / OUTPUT: No intake/output data recorded.  PHYSICAL EXAMINATION: General:  Morbidly obese female on BiPAP in NAD  Neuro:  Easily arouses, oriented with BiPAP in place, MAE, generalized weakness  HEENT:  MM pink/dry, unable to assess jvd due to body habitus  Cardiovascular:  s1s2 rrr, no m/r/g, distant tones Lungs:  Even/non-labored, lungs bilaterally diminished but clear  Abdomen:  Obese/soft, bsx4 active  Musculoskeletal:  No acute deformities  Skin:  Warm/dry, una boots on BLE   LABS:  BMET  Recent Labs Lab 10/14/16 0512 10/14/16 1300  NA 143  --    K 3.8  --   CL 95*  --   CO2 39*  --   BUN 13  --   CREATININE 0.86 0.85  GLUCOSE 83  --     Electrolytes  Recent Labs Lab 10/14/16 0512  CALCIUM 9.2    CBC  Recent Labs Lab 10/14/16 0512 10/14/16 1300  WBC 4.3 4.3  HGB 10.8* 10.4*  HCT 37.7 35.6*  PLT 156 133*    Coag's No results for input(s): APTT, INR in the last 168 hours.  Sepsis Markers No results for input(s): LATICACIDVEN, PROCALCITON, O2SATVEN in the last 168 hours.  ABG  Recent Labs Lab 10/14/16 0943 10/14/16 1131  PHART 7.341* 7.347*  PCO2ART 84.4* 84.3*  PO2ART 76.0* 82.0*    Liver Enzymes No results for input(s): AST, ALT, ALKPHOS, BILITOT, ALBUMIN in the last 168 hours.  Cardiac Enzymes  Recent Labs Lab 10/14/16 1300  TROPONINI <0.03    Glucose No results for input(s): GLUCAP in the last 168 hours.  Imaging  Dg Chest Portable 1 View  Result Date: 10/14/2016 CLINICAL DATA:  55 year old female with morbid obesity, difficulty walking, hypoxia. Recent admission for bacteremia and cellulitis. Initial encounter. EXAM: PORTABLE CHEST 1 VIEW COMPARISON:  Chest radiographs 01/16/2016 and earlier. FINDINGS: Portable AP semi upright view at 0801 hours. Large body habitus. Stable cardiomegaly and mediastinal contours. Stable lung volumes. Stable mild increased pulmonary interstitial markings diffusely. Otherwise, allowing for portable technique the lungs are clear. No pneumothorax or pleural effusion. IMPRESSION: No acute cardiopulmonary abnormality. Electronically Signed   By: Genevie Ann M.D.   On: 10/14/2016 08:14     STUDIES:  CXR 10/30 >> no acute process   CULTURES:   ANTIBIOTICS:   SIGNIFICANT EVENTS: 10/30  Admit with inability to provide self care, mild hypercarbia / AMS  LINES/TUBES:   DISCUSSION: 55 y/o F with multiple medical problems admitted 10/30 with mild hypercarbia, suspected decompensated OHS/OSA and limited ability to provide self care.   ASSESSMENT /  PLAN:  PULMONARY A: Chronic Hypercarbic Respiratory Failure - compensated on admit  OHS / OSA - not compliant with CPAP at this point  COPD  Former Smoker Restrictive Lung Disease - prior spirometry consistent with moderate restriction in 2014 P:   BiPAP support PRN  Goal O2 sats 90-95%  Baseline PCO2 ~ 80  Intermittent CXR  Pulmonary hygiene - IS, mobilize  Minimize sedating medications   CARDIOVASCULAR A:  Chronic Diastolic CHF  Hx HTN, HLD  PVD  Chronic Venous Stasis P:  Trend BNP, troponin  Lasix empirically with metolazone > discussed with Dr. Brett Albino  Admit to SDU for monitoring   RENAL A:   At Risk AKI - in the setting of diuresis  P:   Trend BMP  Diuresis as above  Replace electrolytes as indicated  Foley for I/O's   GASTROINTESTINAL A:   Morbid Obesity  P:   Consider nutrition consult  Consider bariatric assessment   HEMATOLOGIC A:   Mild Anemia  Thrombocytopenia  P:  Trend CBC  Monitor for bleeding   INFECTIOUS A:   No acute infectious process  P:   Monitor fever curve / WBC   ENDOCRINE A:   DM   P:   Monitor glucose / SSI   NEUROLOGIC A:   Chronic Low Back Pain  Depression  Fibromyalgia P:   RASS goal: n/a  Supportive care  Minimize sedation   SOCIAL A: Poor social support - recent d/c from SNF due to insurance  P:  Will need social work assessment for possible placement.  Due to her morbid obesity and home situation, she is not likely able to provide self care. I am very concerned about her going back home.    FAMILY  - Updates: No family at bedside.  Updated on plan of care.    - Inter-disciplinary family meet or Palliative Care meeting due by:  11/6    Noe Gens, NP-C [facility] Pgr: [phone] or if no answer [phone] 10/14/2016, 2:25 PM  Attending note: I have seen and examined the patient with nurse practitioner/resident and agree with the note. History, labs and imaging reviewed.  55  y/o with morbid obesity, OSA/OHS, non compliant with CPAP. Presents with acute on chronic hypoxic, hypercarbic resp failure.  Suspected decompensation of her OHS/OSA +/- volume overload.Noted to be hypoxic in ED and placed on Bipap  Blood pressure (!) 101/46, pulse 86, resp. rate 17, SpO2 98 %. Obese. Somnolent Falls asleep while answering questions. CVS-RRR RS- Clear  Abd soft + BS  ABG- 7.35/84/82/95% Labs and imaging reviewed. CXR does not show any acute cardiopulmonary abnormality  Reccs: Continue bipap Monitor for intubation need Start aggressive diuresis Consider CT head if there is no tunraround in mental status  The patient is critically ill with multiple organ systems failure and requires high complexity decision making for assessment and support, frequent evaluation and titration of therapies, application of advanced monitoring technologies and extensive interpretation of multiple databases.  Critical care time - 35 mins. This represents my time independent of the NPs time taking care of the pt.  Marshell Garfinkel MD [facility] Pager [phone] If no answer or after 3pm call: [phone] 10/14/2016, 4:37 PM

## 2016-10-14 NOTE — Progress Notes (Signed)
Patient has used all of her insurance coverage days for skilled nursing facility placement. Patient will have to pay privately for skilled nursing facility placement if she would like facility placement. CSW has staffed with RN Case Manager who is also actively working on home health needs.    CSW has contacted [name] to inquire about what home health agency patient was discharged with and to inquire about what time of equipment Patient was set up with. CSW awaiting return phone call.      Emiliano [name], [name] [facility] ED/75M Clinical Social Worker [phone]

## 2016-10-14 NOTE — Telephone Encounter (Signed)
Pt's brother wanted to let Dr. Brett Albino know that the pt was admitted early this morning. Pt was taken out of facility on Saturday and was not ready to leave. Medicaid denied stay. Pt's brother states pt is much worse and would like Dr. Brett Albino to call pt on pt's cell phone or call him on the house phone or his cell [phone]. Please advise. Thanks!

## 2016-10-14 NOTE — ED Notes (Signed)
Critical care at bedside  

## 2016-10-14 NOTE — ED Notes (Signed)
Dr. Regenia Skeeter at bedside. Pt rouses to voice and touch.

## 2016-10-14 NOTE — ED Notes (Signed)
Dr. Horton at bedside. 

## 2016-10-14 NOTE — H&P (Signed)
[facility] Admission History and Physical Service Pager: [phone]  [name] Medical record number: [id number] Date of birth: [birth year] Age: 55 y.o. Gender: female  Primary Care Provider: Evette Doffing, MD Consultants: None Code Status: Full  Chief Complaint: Difficulty walking  Assessment and Plan: Kathleen Roy is a 55 y.o. female presenting with difficulty walking at home. She desaturated in the ED and was admitted for observation. PMH is significant for morbid obesity, chronic respiratory failure 2/2 COPD/OHS/OSA (on 4L O2 at home), HFpEF, chronic venous stasis/chronic lymphedema, prediabetes, HTN, peripheral neuropathy, HLD, morbid obesity.  Acute on Chronic Hypoxemic Hypercarbic Respiratory Failure: Pt had a desaturation to 63% on her home 4L O2. ABG with pH 7.341, pCO2 84.4, pO2 76, bicarb 45.8. She likely has a chronic respiratory acidosis from hypoventilation, and is partially compensated given her bicarb on 45.8. The desaturation in the ED occurred when she was sleeping, so was likely secondary to uncontrolled OSA. Pt does not use a CPAP at home because she cannot afford it. Other differentials include COPD, exacerbation, given her wheezing, but Pt denies cough or sputum production. CHF exacerbation is also a possibility, but it is difficult to assess her fluid status given her body habitus. PE less likely with normal HR. Opioid overdose is also a possibility, although patient states that she only took 1 Oxycodone at 2AM and has only been taking these every 6 hours. ACS is a possibility, although patient denies chest pain. - Admit to stepdown, attending Dr. Nori Riis.  - Pulm/CCM consulted, appreciate assistance - Continuous BiPAP for now  - Will check a BNP - Lasix IV 40mg  bid for now. Holding home Torsemide 40mg  bid PO. - Add Metolazone 5mg  daily x 1 - Daily weights, strict I/O - Duonebs q6hrs and Albuterol q4hrs prn - Start Spiriva. Pt not  on a controller med. - Holding home oxycodone until she becomes more alert. - Will get a stat EKG to rule out ACS - Trend troponins x 3 - Cardiac monitoring - Continuous pulse ox  Altered Mental Status: Likely secondary to acute on chronic respiratory failure. May also be related to the fact that Pt normally sleeps from 4AM to [facility] and she took her sleep medications around 2AM.  - Continue BiPAP for now. If mental status doesn't improve with BiPAP, can consider AMS work-up. - Will check a UDS and TSH  Difficulty with ambulation: Has been a chronic problem for her secondary to her body habitus. - PT/OT consult  - Case management consult for assistance with home health needs  HFpEF: Last ECHO 08/20/16 with EF 60-65%, G2DD. On Torsemide 40mg  bid at home. Unclear if Pt is volume overloaded. - Lasix IV 40mg  bid - Add Metolazone 5mg  PO daily x 1 and re-assess - Daily weights - Strict I/O  Chronic Venous Stasis/Chronic Lymphedema: Recently treated for bilateral lower extremity cellulitis. Now with unna boots in place, which were supposed to be changed today. - Will remove unna boots - Unna boots need to be replaced prior to discharge.  Prediabetes: Last A1c 6.3% on 06/04/16. On Glipizide 5mg  daily at home. - Will check a daily morning CBG and start SSI for CBG > 140.  HTN: Well-controlled. BP 117/74. - Lasix 40mg  IV bid as above.  Peripheral Neuropathy: - Continue Lyrica 150mg  bid  HLD: Last lipid panel 01/16/16: Chol 119, TG 159, HDL 45, LDL 42 - Continue Pravastatin 40mg  qhs  Depression: - Continue home Effexor and Abilify  Chronic pain: - Continue home Lyrica and Flexeril - Holding home Oxycodone 15mg  q6hrs prn until she wakes up more.  FEN/GI: Heart healthy carb-modified diet Prophylaxis: Lovenox  Disposition: Likely home with home health services  History of Present Illness:  Kathleen Roy is a 55 y.o. female presenting with difficulty with ambulation. She was hospitalized  from 9/1 to 9/6 for bacteremia 2/2 lower extremity cellulitis. She was discharged from the hospital to [facility] for continued PT/OT. She was just discharged from SNF over the weekend. When she got home, she had to call EMS multiple times to assist her from getting from the couch to the bathroom. She states she was supposed to be discharged with home health, but no one has come out to her house. She thinks she was supposed to have some equipment delivered to her home today. She presented to the ED because she was unable to get around her house.   In the ED, she was noted to have O2 sats in the 60s and 70s while sleeping. She was on her home 4L O2 at the time. ABG was ordered and was significant for pH 7.34, CO2 84.4, pO2 76. She was placed on BiPAP. CXR was ordered and was negative for any active cardiopulmonary processes. Given her desaturations, she was admitted for further management.  In the ED, she was noted to be very sleepy. She was able to answer intermittent questions, but was falling asleep during our conversation. She states she has not had any shortness of breath or chest pain. She states she thinks she took her sleeping pill late in the evening, but cannot remember when. She states she last took her Oxycodone at 2am.  Review Of Systems: Per HPI with the following additions: Difficult to obtain secondary to sleepiness, but Pt denies shortness of breath, chest pain, or cough.  ROS  Patient Active Problem List   Diagnosis Date Noted  . Hypoxia 10/14/2016  . Bacteremia   . Cellulitis of right lower extremity 06/04/2016  . Type 2 diabetes mellitus ([place]) 02/14/2016  . Pressure ulcer 01/16/2016  . Acute on chronic respiratory failure with hypercapnia ([place])   . Chronic diastolic congestive heart failure ([place])   . Essential hypertension   . CAP (community acquired pneumonia) 01/15/2016  . Bilateral knee pain 03/22/2015  . OSA (obstructive sleep apnea)   . Obesity hypoventilation  syndrome ([place])   . Chronic respiratory failure with hypoxia ([place])   . Assistance needed for grooming 11/08/2014  . Fatigue 11/03/2014  . Memory loss 03/18/2014  . Tremor 03/18/2014  . Vulvovaginal candidiasis 11/25/2013  . Restrictive airway disease 08/26/2013  . Hypokalemia 08/19/2013  . Health care maintenance 06/17/2013  . Venous stasis dermatitis of both lower extremities 12/02/2012  . COPD (chronic obstructive pulmonary disease) ([place]) 10/15/2011  . Peripheral neuropathy ([place]) 10/15/2011  . KNEE PAIN, BILATERAL 01/22/2011  . BACK PAIN, LUMBAR, CHRONIC 01/22/2011  . MIGRAINE HEADACHE 10/25/2010  . HYPERLIPIDEMIA 04/07/2009  . EDEMA, CHRONIC 06/27/2008  . Morbid obesity ([place]) 11/16/2007  . DEPRESSION, MAJOR, RECURRENT 02/12/2007  . PANIC ATTACKS 02/12/2007  . HYPERTENSION, BENIGN SYSTEMIC 02/12/2007    Past Medical History: Past Medical History:  Diagnosis Date  . Arthritis    "I feel like it's everywhere" (11/08/2014)  . Cellulitis and abscess of lower extremity 08/16/2016   right leg  . Chronic lower back pain   . COPD (chronic obstructive pulmonary disease) ([place])   . Depression   . Diabetes mellitus without complication (  [place])    type 2   . Fibromyalgia   . Headache    "at least weekly; it's usually when I first wake up" (11/08/2014)  . Hyperlipidemia   . Hypertension   . HYPERTENSION, BENIGN SYSTEMIC 02/12/2007  . Memory disturbance   . Migraines 1970's - <2000   "they just went away"  . Neuropathy ([place])   . Normal echocardiogram 05/30/05   suboptimal study  . Obesity, morbid (more than 100 lbs over ideal weight or BMI > 40) (H[facility])    obese since childhood  . Osteoarthritis   . Peripheral edema   . Pressure ulcer of foot    left  . Sepsis ([place])   . Shortness of breath dyspnea   . Sleep apnea    "recently dx'd; haven't got my equipment yet" (11/08/2014)    Past Surgical History: Past Surgical History:  Procedure Laterality Date  . ABDOMINAL HERNIA  REPAIR  2002  . APPENDECTOMY  1995  . CHOLECYSTECTOMY  1995  . TOTAL ABDOMINAL HYSTERECTOMY  1994    Social History: Social History  Substance Use Topics  . Smoking status: Former Smoker    Packs/day: 1.00    Years: 30.00    Types: Cigarettes    Quit date: 01/05/2009  . Smokeless tobacco: Never Used  . Alcohol use No   Additional social history: Just discharged from SNF. Lives at home with brother, who is her primary caretaker. Please also refer to relevant sections of EMR.  Family History: Family History  Problem Relation Age of Onset  . Stroke Mother   . Dementia Father   . Prostate cancer Father   . Congestive Heart Failure Brother   . Diabetes Brother   . Hypertension Brother     Allergies and Medications: Allergies  Allergen Reactions  . Aspirin Hives    REACTION: hives Hasn't had in years    No current facility-administered medications on file prior to encounter.    Current Outpatient Prescriptions on File Prior to Encounter  Medication Sig Dispense Refill  . ARIPiprazole (ABILIFY) 2 MG tablet take 1 tablet by mouth every other day 45 tablet 0  . Ascorbic Acid (VITAMIN C) 1000 MG tablet Take 3,000 mg by mouth at bedtime.     . cetirizine (ZYRTEC) 10 MG tablet Take 10 mg by mouth daily.    . cyclobenzaprine (FLEXERIL) 10 MG tablet Take 1 tablet (10 mg total) by mouth 3 (three) times daily as needed for muscle spasms. 90 tablet 2  . glipiZIDE (GLUCOTROL) 5 MG tablet take 1 tablet by mouth once daily 30 tablet 2  . hydrocerin (EUCERIN) CREA Apply 1 application topically 2 (two) times daily. 228 g 0  . nystatin (MYCOSTATIN) powder Apply topically 2 (two) times daily. Apply to affected skin BID (Patient taking differently: Apply 1 g topically 2 (two) times daily as needed (yeast). Apply to affected skin BID) 360 g 6  . Omega-3 Fatty Acids (FISH OIL) 1000 MG CAPS Take 2 capsules (2,000 mg total) by mouth at bedtime. 60 capsule 2  . oxyCODONE (ROXICODONE) 15 MG  immediate release tablet Take 0.5 tablets (7.5 mg total) by mouth every 6 (six) hours as needed for severe pain. 30 tablet 0  . OXYGEN Inhale into the lungs. 4l/min    . polyethylene glycol (MIRALAX / GLYCOLAX) packet Take 17 g by mouth at bedtime.     . potassium chloride SA (K-DUR,KLOR-CON) 20 MEQ tablet Take 2 tablets (40 mEq total) by mouth daily. [place]  tablet 0  . pravastatin (PRAVACHOL) 40 MG tablet Take 1 tablet (40 mg total) by mouth at bedtime. 90 tablet 1  . pregabalin (LYRICA) 150 MG capsule Take 1 capsule (150 mg total) by mouth 2 (two) times daily. 120 capsule 0  . Sodium Chloride Flush (NORMAL SALINE FLUSH) 0.9 % SOLN Inject into the vein. 17ml IV every 12 hrs    . torsemide (DEMADEX) 20 MG tablet take 2 tablets by mouth twice a day 360 tablet 1  . traZODone (DESYREL) 150 MG tablet Take 2 tablets (300 mg total) by mouth at bedtime. 180 tablet 1  . Vancomycin (VANCOCIN) 750-5 MG/150ML-% SOLN Inject 150 mLs (750 mg total) into the vein every 12 (twelve) hours. Start 08/22/16 at 2200, last dose 08/31/16 at 2200 4000 mL 0  . venlafaxine XR (EFFEXOR-XR) 150 MG 24 hr capsule Take 2 capsules (300 mg total) by mouth daily. 180 capsule 0    Objective: BP 114/55   Pulse 77   Resp 18   SpO2 100%  Exam: General: Tired-appearing, falling asleep intermittently Eyes: PERRLA, EOMI, no scleral icterus ENTM: Nose normal, BiPAP in place, dry mucous membranes Neck: Supple, no cervical lymphadenopathy Cardiovascular: RRR, no murmurs Respiratory: BiPAP in place, normal work of breathing, expiratory wheezing throughout right lung field Gastrointestinal: +BS, soft, non-tender, non-distended MSK: Significant lymphedema in lower extremities bilaterally, chronic venous stasis skin changes present, unna boots in place bilaterally Derm: chronic venous stasis changes, lower extremities are erythematous but not warm to the touch. Neuro: Intermittently falling asleep but can be aroused, oriented x 3, CN 2-12  grossly intact Psych: Normal behavior  Labs and Imaging: CBC BMET   Recent Labs Lab 10/14/16 0512  WBC 4.3  HGB 10.8*  HCT 37.7  PLT 156    Recent Labs Lab 10/14/16 0512  NA 143  K 3.8  CL 95*  CO2 39*  BUN 13  CREATININE 0.86  GLUCOSE 83  CALCIUM 9.2     CXR (10/30): No acute abnormalities  Sela Hua, MD 10/14/2016, 10:12 AM PGY-2, [place] Intern pager: [phone], text pages welcome

## 2016-10-14 NOTE — ED Notes (Signed)
Pt noted to be 56-63% SpO2 while on 4L nasal cannula.  Notified Dr Regenia Skeeter.  Pt states she is suppose to sleep with a CPAP machine but doesn't have the money to purchase the equipment.

## 2016-10-14 NOTE — Progress Notes (Signed)
Patient titrated to a 2L [place] no complications noted.

## 2016-10-14 NOTE — ED Notes (Signed)
Pt has removed mask and sat drop to 67%. Mask replaced and RT cllled to pt. Lips wet prior to replacing mask.

## 2016-10-14 NOTE — Progress Notes (Signed)
Pt is a chronic CO2 retainer. Bilateral breaths sounds are coarse crackles heard throughout

## 2016-10-14 NOTE — Progress Notes (Signed)
ABG collected  

## 2016-10-14 NOTE — ED Notes (Signed)
Pt has increased lethargy.  Dr Regenia Skeeter at bedside.

## 2016-10-15 DIAGNOSIS — I890 Lymphedema, not elsewhere classified: Secondary | ICD-10-CM | POA: Diagnosis present

## 2016-10-15 DIAGNOSIS — G4700 Insomnia, unspecified: Secondary | ICD-10-CM | POA: Diagnosis not present

## 2016-10-15 DIAGNOSIS — M25569 Pain in unspecified knee: Secondary | ICD-10-CM | POA: Diagnosis not present

## 2016-10-15 DIAGNOSIS — I4510 Unspecified right bundle-branch block: Secondary | ICD-10-CM | POA: Diagnosis present

## 2016-10-15 DIAGNOSIS — R0902 Hypoxemia: Secondary | ICD-10-CM | POA: Diagnosis not present

## 2016-10-15 DIAGNOSIS — M6281 Muscle weakness (generalized): Secondary | ICD-10-CM | POA: Diagnosis not present

## 2016-10-15 DIAGNOSIS — J989 Respiratory disorder, unspecified: Secondary | ICD-10-CM | POA: Diagnosis not present

## 2016-10-15 DIAGNOSIS — R5383 Other fatigue: Secondary | ICD-10-CM | POA: Diagnosis not present

## 2016-10-15 DIAGNOSIS — R21 Rash and other nonspecific skin eruption: Secondary | ICD-10-CM | POA: Diagnosis not present

## 2016-10-15 DIAGNOSIS — E872 Acidosis: Secondary | ICD-10-CM | POA: Diagnosis not present

## 2016-10-15 DIAGNOSIS — J069 Acute upper respiratory infection, unspecified: Secondary | ICD-10-CM | POA: Diagnosis not present

## 2016-10-15 DIAGNOSIS — I878 Other specified disorders of veins: Secondary | ICD-10-CM | POA: Diagnosis present

## 2016-10-15 DIAGNOSIS — J449 Chronic obstructive pulmonary disease, unspecified: Secondary | ICD-10-CM | POA: Diagnosis not present

## 2016-10-15 DIAGNOSIS — Z6841 Body Mass Index (BMI) 40.0 and over, adult: Secondary | ICD-10-CM | POA: Diagnosis not present

## 2016-10-15 DIAGNOSIS — G4733 Obstructive sleep apnea (adult) (pediatric): Secondary | ICD-10-CM

## 2016-10-15 DIAGNOSIS — I5032 Chronic diastolic (congestive) heart failure: Secondary | ICD-10-CM | POA: Diagnosis not present

## 2016-10-15 DIAGNOSIS — J9601 Acute respiratory failure with hypoxia: Secondary | ICD-10-CM

## 2016-10-15 DIAGNOSIS — L03115 Cellulitis of right lower limb: Secondary | ICD-10-CM | POA: Diagnosis not present

## 2016-10-15 DIAGNOSIS — I110 Hypertensive heart disease with heart failure: Secondary | ICD-10-CM | POA: Diagnosis not present

## 2016-10-15 DIAGNOSIS — E662 Morbid (severe) obesity with alveolar hypoventilation: Secondary | ICD-10-CM

## 2016-10-15 DIAGNOSIS — Z66 Do not resuscitate: Secondary | ICD-10-CM | POA: Diagnosis not present

## 2016-10-15 DIAGNOSIS — L309 Dermatitis, unspecified: Secondary | ICD-10-CM | POA: Diagnosis not present

## 2016-10-15 DIAGNOSIS — J80 Acute respiratory distress syndrome: Secondary | ICD-10-CM | POA: Diagnosis not present

## 2016-10-15 DIAGNOSIS — G9009 Other idiopathic peripheral autonomic neuropathy: Secondary | ICD-10-CM | POA: Diagnosis not present

## 2016-10-15 DIAGNOSIS — R531 Weakness: Secondary | ICD-10-CM

## 2016-10-15 DIAGNOSIS — E1140 Type 2 diabetes mellitus with diabetic neuropathy, unspecified: Secondary | ICD-10-CM | POA: Diagnosis not present

## 2016-10-15 DIAGNOSIS — M1990 Unspecified osteoarthritis, unspecified site: Secondary | ICD-10-CM | POA: Diagnosis present

## 2016-10-15 DIAGNOSIS — R05 Cough: Secondary | ICD-10-CM | POA: Diagnosis not present

## 2016-10-15 DIAGNOSIS — D696 Thrombocytopenia, unspecified: Secondary | ICD-10-CM | POA: Diagnosis not present

## 2016-10-15 DIAGNOSIS — Z9981 Dependence on supplemental oxygen: Secondary | ICD-10-CM | POA: Diagnosis not present

## 2016-10-15 DIAGNOSIS — J9621 Acute and chronic respiratory failure with hypoxia: Secondary | ICD-10-CM | POA: Diagnosis not present

## 2016-10-15 DIAGNOSIS — J9600 Acute respiratory failure, unspecified whether with hypoxia or hypercapnia: Secondary | ICD-10-CM | POA: Diagnosis not present

## 2016-10-15 DIAGNOSIS — J9622 Acute and chronic respiratory failure with hypercapnia: Secondary | ICD-10-CM | POA: Diagnosis present

## 2016-10-15 DIAGNOSIS — J9602 Acute respiratory failure with hypercapnia: Secondary | ICD-10-CM | POA: Diagnosis not present

## 2016-10-15 DIAGNOSIS — I872 Venous insufficiency (chronic) (peripheral): Secondary | ICD-10-CM | POA: Diagnosis present

## 2016-10-15 DIAGNOSIS — R262 Difficulty in walking, not elsewhere classified: Secondary | ICD-10-CM | POA: Diagnosis present

## 2016-10-15 DIAGNOSIS — D649 Anemia, unspecified: Secondary | ICD-10-CM | POA: Diagnosis present

## 2016-10-15 DIAGNOSIS — E785 Hyperlipidemia, unspecified: Secondary | ICD-10-CM | POA: Diagnosis not present

## 2016-10-15 DIAGNOSIS — H81399 Other peripheral vertigo, unspecified ear: Secondary | ICD-10-CM | POA: Diagnosis not present

## 2016-10-15 DIAGNOSIS — Z9119 Patient's noncompliance with other medical treatment and regimen: Secondary | ICD-10-CM | POA: Diagnosis not present

## 2016-10-15 DIAGNOSIS — Z515 Encounter for palliative care: Secondary | ICD-10-CM | POA: Diagnosis not present

## 2016-10-15 DIAGNOSIS — E1151 Type 2 diabetes mellitus with diabetic peripheral angiopathy without gangrene: Secondary | ICD-10-CM | POA: Diagnosis not present

## 2016-10-15 DIAGNOSIS — M25561 Pain in right knee: Secondary | ICD-10-CM | POA: Diagnosis not present

## 2016-10-15 DIAGNOSIS — I509 Heart failure, unspecified: Secondary | ICD-10-CM | POA: Diagnosis not present

## 2016-10-15 DIAGNOSIS — R2689 Other abnormalities of gait and mobility: Secondary | ICD-10-CM | POA: Diagnosis not present

## 2016-10-15 DIAGNOSIS — Z7189 Other specified counseling: Secondary | ICD-10-CM | POA: Diagnosis not present

## 2016-10-15 DIAGNOSIS — E1165 Type 2 diabetes mellitus with hyperglycemia: Secondary | ICD-10-CM | POA: Diagnosis not present

## 2016-10-15 LAB — BASIC METABOLIC PANEL
Anion gap: 9 (ref 5–15)
BUN: 10 mg/dL (ref 6–20)
CHLORIDE: 98 mmol/L — AB (ref 101–111)
CO2: 39 mmol/L — ABNORMAL HIGH (ref 22–32)
CREATININE: 0.84 mg/dL (ref 0.44–1.00)
Calcium: 9 mg/dL (ref 8.9–10.3)
Glucose, Bld: 74 mg/dL (ref 65–99)
POTASSIUM: 3.6 mmol/L (ref 3.5–5.1)
SODIUM: 146 mmol/L — AB (ref 135–145)

## 2016-10-15 LAB — GLUCOSE, CAPILLARY: Glucose-Capillary: 65 mg/dL (ref 65–99)

## 2016-10-15 LAB — CBC
HCT: 33.7 % — ABNORMAL LOW (ref 36.0–46.0)
Hemoglobin: 9.7 g/dL — ABNORMAL LOW (ref 12.0–15.0)
MCH: 29 pg (ref 26.0–34.0)
MCHC: 28.8 g/dL — ABNORMAL LOW (ref 30.0–36.0)
MCV: 100.6 fL — AB (ref 78.0–100.0)
PLATELETS: 133 10*3/uL — AB (ref 150–400)
RBC: 3.35 MIL/uL — AB (ref 3.87–5.11)
RDW: 17.3 % — AB (ref 11.5–15.5)
WBC: 3.7 10*3/uL — AB (ref 4.0–10.5)

## 2016-10-15 LAB — TROPONIN I

## 2016-10-15 MED ORDER — ENOXAPARIN SODIUM 100 MG/ML ~~LOC~~ SOLN
85.0000 mg | SUBCUTANEOUS | Status: DC
  Administered 2016-10-16 – 2016-10-17 (×2): 85 mg via SUBCUTANEOUS
  Filled 2016-10-15 (×2): qty 1

## 2016-10-15 MED ORDER — ALBUTEROL SULFATE (2.5 MG/3ML) 0.083% IN NEBU: 2.5000 mg | INHALATION_SOLUTION | RESPIRATORY_TRACT | Status: DC | PRN

## 2016-10-15 NOTE — Progress Notes (Signed)
Orthopedic Tech Progress Note Patient Details:  Kathleen Roy [birth year] [id number]  Ortho Devices Type of Ortho Device: Louretta Parma boot Ortho Device/Splint Location: Bilateral Retail buyer with [place] Ortho Device/Splint Interventions: Ordered, Application   [name] 10/15/2016, 5:56 PM

## 2016-10-15 NOTE — Progress Notes (Signed)
Called to room per RN for patient desaturation in the 60's. On arrival noted, sats in the 60's with good pleth. Pt was asleep on arrival. Pt placed back on NIV on previous settings for  desaturation and sleeping OSA.

## 2016-10-15 NOTE — Progress Notes (Signed)
[facility] Progress Note [name] DOB: [birth year] MRN: [id number]   Date of Service  10/15/2016  HPI/Events of Note  Patiently recently transferred from the emergency room to the ICU as a SDU border.   Patient admitted with acute on chronic hypoxemic hypercapnic respiratory failure secondary to decompensated CHFp EF exacerbation, undertreated obstructive sleep apnea, obesity hypoventilation syndrome.  Patient was seen, comfortable. Wearing her BiPAP. Blood pressure 98/56, HR 80, RR 20s, 91% on Bipap.   They removed BiPAP earlier with note of desaturation.   ABG reviewed > chronic hypercapnia, compensated.   eICU Interventions  Continue BiPAP for now.  Continue present management.  Plan d/w nurse.         [place] 10/15/2016, 4:04 AM

## 2016-10-15 NOTE — Evaluation (Signed)
Physical Therapy Evaluation Patient Details Name: Kathleen Roy MRN: [id number] DOB: [birth year] Today's Date: 10/15/2016   History of Present Illness  Pt admitted from home after d/c from SNF 2 days prior with hypoxia and AMS requiring bipap. PMH: morbid obesity, chronic LE cellulitis, foot ulcer, depression, fibromyalgia, headaches, neuropathy, HTN, CHF, COPD.   Clinical Impression  Pt admitted with above diagnosis and presents to PT with functional limitations due to deficits listed below (See PT problem list). Pt needs skilled PT to maximize independence and safety to allow discharge to back to SNF. Pt with recent DC home from SNF with very quick decline at home. Brother who has assisted pt is now having his own medical issues and isn't able to provide amount of assist pt is requiring.     Follow Up Recommendations SNF    Equipment Recommendations  Other (comment) (Pt reports rolling walker was supposed to be ordered by SNF)    Recommendations for Other Services       Precautions / Restrictions Precautions Precautions: Fall Restrictions Weight Bearing Restrictions: No      Mobility  Bed Mobility Overal bed mobility: +2 for physical assistance;Needs Assistance Bed Mobility: Supine to Sit     Supine to sit: +2 for physical assistance;Min assist     General bed mobility comments: HOB up, increased time, assist for LEs to EOB and to raise trunk, heavy reliance on momentum and rail  Transfers Overall transfer level: Needs assistance Equipment used: Rolling walker (2 wheeled) Transfers: Sit to/from Stand Sit to Stand: +2 physical assistance;Min assist;From elevated surface         General transfer comment: from elevated bed, assist to rise and steady. Incr time to rise  Ambulation/Gait Ambulation/Gait assistance: Min assist;+2 safety/equipment Ambulation Distance (Feet): 10 Feet Assistive device: Rolling walker (2 wheeled) Gait Pattern/deviations: Step-to  pattern;Decreased step length - right;Decreased step length - left;Shuffle;Wide base of support;Trunk flexed Gait velocity: decr Gait velocity interpretation: Below normal speed for age/gender General Gait Details: Assist for balance and support. Pt fatigues quickly  Stairs            Wheelchair Mobility    Modified Rankin (Stroke Patients Only)       Balance Overall balance assessment: Needs assistance Sitting-balance support: No upper extremity supported;Feet supported Sitting balance-Leahy Scale: Fair     Standing balance support: Bilateral upper extremity supported Standing balance-Leahy Scale: Poor Standing balance comment: walker and min A for static standing                             Pertinent Vitals/Pain Pain Assessment: Faces Faces Pain Scale: Hurts even more Pain Location: bil knees Pain Descriptors / Indicators: Aching Pain Intervention(s): Monitored during session;Limited activity within patient's tolerance;Repositioned    Home Living Family/patient expects to be discharged to:: Private residence Living Arrangements: Other relatives (brother) Available Help at Discharge: Family;Available 24 hours/day Type of Home: House Home Access: Stairs to enter Entrance Stairs-Rails: Right;Left;None Technical brewer of Steps: 3 Home Layout: One level Home Equipment: Cane - single point;Bedside commode;Shower seat;Adaptive equipment (02--4L typically) Additional Comments: reports Leon ordering a new BSC and RW    Prior Function Level of Independence: Needs assistance   Gait / Transfers Assistance Needed: ambulating with walker with  PT at SNF 75 ft  ADL's / Homemaking Assistance Needed: SNF staffed performed bathing and dressing, pt groomed an self fed, brother helped with IADL at home and bathing  Comments: Pt can only leave her home with ambulance transportation.  Does not have a w/c and has applied for a ramp to be built.     Hand  Dominance   Dominant Hand: Right    Extremity/Trunk Assessment   Upper Extremity Assessment: Defer to OT evaluation           Lower Extremity Assessment: Generalized weakness         Communication   Communication: No difficulties  Cognition Arousal/Alertness: Awake/alert Behavior During Therapy: WFL for tasks assessed/performed Overall Cognitive Status: Within Functional Limits for tasks assessed                      General Comments      Exercises     Assessment/Plan    PT Assessment Patient needs continued PT services  PT Problem List Decreased strength;Decreased activity tolerance;Decreased balance;Decreased mobility;Obesity          PT Treatment Interventions Gait training;DME instruction;Functional mobility training;Therapeutic activities;Therapeutic exercise;Balance training;Patient/family education    PT Goals (Current goals can be found in the Care Plan section)  Acute Rehab PT Goals Patient Stated Goal: to be more independent PT Goal Formulation: With patient Time For Goal Achievement: 10/29/16 Potential to Achieve Goals: Fair    Frequency Min 3X/week   Barriers to discharge Decreased caregiver support brother unable to provide needed assist. Brother being worked up for [employer] PT/OT/SLP Co-Evaluation/Treatment: Yes Reason for Co-Treatment: For Doctor, hospital PT goals addressed during session: Mobility/safety with mobility OT goals addressed during session: ADL's and self-care       End of Session Equipment Utilized During Treatment: Oxygen Activity Tolerance: Patient limited by fatigue Patient left: in chair;with call bell/phone within reach Nurse Communication: Mobility status    Functional Assessment Tool Used: clinical observation Functional Limitation: Mobility: Walking and moving around Mobility: Walking and Moving Around Current Status [phone]): At least 40 percent but less than 60 percent impaired,  limited or restricted Mobility: Walking and Moving Around Goal Status [phone]): At least 1 percent but less than 20 percent impaired, limited or restricted    Time: [phone] PT Time Calculation (min) (ACUTE ONLY): 29 min   Charges:   PT Evaluation $PT Eval Moderate Complexity: 1 Procedure     PT G Codes:   PT G-Codes **NOT FOR INPATIENT CLASS** Functional Assessment Tool Used: clinical observation Functional Limitation: Mobility: Walking and moving around Mobility: Walking and Moving Around Current Status [id number]): At least 40 percent but less than 60 percent impaired, limited or restricted Mobility: Walking and Moving Around Goal Status [phone]): At least 1 percent but less than 20 percent impaired, limited or restricted    [facility] 10/15/2016, 11:04 AM [facility] PT [phone]

## 2016-10-15 NOTE — Progress Notes (Signed)
Pt transferred to [facility] no distress or complications noted with transport.

## 2016-10-15 NOTE — Progress Notes (Signed)
Patient transferring to room 21 on 6E. Called report to [employer].

## 2016-10-15 NOTE — ED Notes (Signed)
Mingo Amber MD Family Medicine notified that patient is back on BiPAP

## 2016-10-15 NOTE — Consult Note (Signed)
PULMONARY / CRITICAL CARE MEDICINE   Name: Kathleen Roy MRN: [id number] DOB: 1961/08/14    ADMISSION DATE:  10/14/2016 CONSULTATION DATE:  10/14/16  REFERRING MD:  Dr. Nori Riis   CHIEF COMPLAINT:  Altered Mental Status   HISTORY OF PRESENT ILLNESS:  55 y/o F, former smoker (quit 2009, 35 pk yr hx) morbidly obese female, followed by Dr. Brett Albino at [facility] with a PMH of arthritis, chronic lower extremity cellulitis in the setting of venous insufficiency, foot ulcers in una boots, depression, fibromyalgia, headaches, migraines, neuropathy, HTN, HLD, chronic diastolic CHF and COPD (not defined, spirometry in 2014 consistent with restrictive disease, she weighed 335 lbs at that time) who presented to [facility] on 10/30 with reports of altered mental status.    At baseline, the patient lives with her brother.  She recently was at a SNF for diastolic CHF.  She reportedly was discharged the weekend prior to presentation due to Medicare stopping payment(?).  At home, she was unable to provide self care and her brother was unable to as well due to his own medical problems.  She called EMS 4 times overnight to go to the restroom.  EMS had concerns regarding her home living situation due to her size and ability to provide CPR if needed.  The patient was ultimately transported to the ER for assessment.  She reported on presentation that she had concerns about staying at home.  She notes increased lower extremity swelling.  In the ER, she was placed on BiPAP for low O2 sats.  ABG assessment demonstrated 7.347 / 84 / 82.  CXR assessment demonstrated no acute findings.   PCCM consulted for assessment.   PAST MEDICAL HISTORY :  She  has a past medical history of Arthritis; Cellulitis and abscess of lower extremity (08/16/2016); Chronic lower back pain; COPD (chronic obstructive pulmonary disease) ([place]); Depression; Diabetes mellitus without complication ([place]); Fibromyalgia; Headache; Hyperlipidemia; Hypertension; HYPERTENSION,  BENIGN SYSTEMIC (02/12/2007); Memory disturbance; Migraines (1970's - <2000); Neuropathy ([place]); Normal echocardiogram (05/30/05); Obesity, morbid (more than 100 lbs over ideal weight or BMI > 40) ([place]); Osteoarthritis; Peripheral edema; Pressure ulcer of foot; Sepsis ([place]); Shortness of breath dyspnea; and Sleep apnea.  PAST SURGICAL HISTORY: She  has a past surgical history that includes Total abdominal hysterectomy (1994); Abdominal hernia repair (2002); Appendectomy (1995); and Cholecystectomy (1995).  Allergies  Allergen Reactions  . Aspirin Hives    REACTION: hives Hasn't had in years     No current facility-administered medications on file prior to encounter.    Current Outpatient Prescriptions on File Prior to Encounter  Medication Sig  . ARIPiprazole (ABILIFY) 2 MG tablet take 1 tablet by mouth every other day (Patient taking differently: Take 2 mg by mouth every other day)  . Ascorbic Acid (VITAMIN C) 1000 MG tablet Take 3,000 mg by mouth at bedtime.   . cetirizine (ZYRTEC) 10 MG tablet Take 10 mg by mouth daily.  . cyclobenzaprine (FLEXERIL) 10 MG tablet Take 1 tablet (10 mg total) by mouth 3 (three) times daily as needed for muscle spasms.  Marland Kitchen glipiZIDE (GLUCOTROL) 5 MG tablet take 1 tablet by mouth once daily (Patient taking differently: Take 5 mg by mouth every morning)  . hydrocerin (EUCERIN) CREA Apply 1 application topically 2 (two) times daily.  Marland Kitchen nystatin (MYCOSTATIN) powder Apply topically 2 (two) times daily. Apply to affected skin BID (Patient taking differently: Apply topically 2 (two) times daily as needed. TO AFFECTED AREAS)  . Omega-3 Fatty Acids (FISH OIL) 1000  MG CAPS Take 2 capsules (2,000 mg total) by mouth at bedtime.  Marland Kitchen oxyCODONE (ROXICODONE) 15 MG immediate release tablet Take 0.5 tablets (7.5 mg total) by mouth every 6 (six) hours as needed for severe pain. (Patient not taking: Reported on 10/14/2016)  . OXYGEN Inhale into the lungs. 4l/min  . polyethylene  glycol (MIRALAX / GLYCOLAX) packet Take 17 g by mouth at bedtime.   . potassium chloride SA (K-DUR,KLOR-CON) 20 MEQ tablet Take 2 tablets (40 mEq total) by mouth daily.  . pravastatin (PRAVACHOL) 40 MG tablet Take 1 tablet (40 mg total) by mouth at bedtime. (Patient not taking: Reported on 10/14/2016)  . pregabalin (LYRICA) 150 MG capsule Take 1 capsule (150 mg total) by mouth 2 (two) times daily.  Marland Kitchen torsemide (DEMADEX) 20 MG tablet take 2 tablets by mouth twice a day (Patient taking differently: Take 40 mg by mouth two times a day)  . traZODone (DESYREL) 150 MG tablet Take 2 tablets (300 mg total) by mouth at bedtime.  . Vancomycin (VANCOCIN) 750-5 MG/150ML-% SOLN Inject 150 mLs (750 mg total) into the vein every 12 (twelve) hours. Start 08/22/16 at 2200, last dose 08/31/16 at 2200 (Patient not taking: Reported on 10/14/2016)  . venlafaxine XR (EFFEXOR-XR) 150 MG 24 hr capsule Take 2 capsules (300 mg total) by mouth daily.    FAMILY HISTORY:  Her indicated that her mother is deceased. She indicated that her father is deceased. She indicated that her sister is alive. She indicated that her brother is alive.    SOCIAL HISTORY: She  reports that she quit smoking about 7 years ago. Her smoking use included Cigarettes. She has a 30.00 pack-year smoking history. She has never used smokeless tobacco. She reports that she does not drink alcohol or use drugs.  REVIEW OF SYSTEMS:  Unable to complete due to BiPAP   SUBJECTIVE:   VITAL SIGNS: BP 111/76   Pulse 85   Temp 98.5 F (36.9 C) (Oral)   Resp 14   Ht 5\' 2"  (1.575 m)   Wt (!) 390 lb (176.9 kg)   SpO2 94%   BMI 71.33 kg/m   HEMODYNAMICS:    VENTILATOR SETTINGS: FiO2 (%):  [35 %] 35 %  INTAKE / OUTPUT: I/O last 3 completed shifts: In: 240 [P.O.:240] Out: 450 [Urine:450]  PHYSICAL EXAMINATION: General:  Morbidly obese female. Sitting up in chair.  Neuro:  Awake, responsive MAE, generalized weakness  HEENT:  MM pink/dry, unable to  assess jvd due to body habitus  Cardiovascular:  s1s2 rrr, no m/r/g, distant tones Lungs:  Even/non-labored, lungs bilaterally diminished but clear  Abdomen:  Obese/soft, bsx4 active  Musculoskeletal:  No acute deformities  Skin:  Warm/dry, una boots on BLE   LABS:  BMET  Recent Labs Lab 10/14/16 0512 10/14/16 1300 10/15/16 0329  NA 143  --  146*  K 3.8  --  3.6  CL 95*  --  98*  CO2 39*  --  39*  BUN 13  --  10  CREATININE 0.86 0.85 0.84  GLUCOSE 83  --  74    Electrolytes  Recent Labs Lab 10/14/16 0512 10/15/16 0329  CALCIUM 9.2 9.0    CBC  Recent Labs Lab 10/14/16 0512 10/14/16 1300 10/15/16 0329  WBC 4.3 4.3 3.7*  HGB 10.8* 10.4* 9.7*  HCT 37.7 35.6* 33.7*  PLT 156 133* 133*    Coag's No results for input(s): APTT, INR in the last 168 hours.  Sepsis Markers No results for  input(s): LATICACIDVEN, PROCALCITON, O2SATVEN in the last 168 hours.  ABG  Recent Labs Lab 10/14/16 1131 10/14/16 1905 10/14/16 2227  PHART 7.347* 7.404 7.416  PCO2ART 84.3* 73.0* 72.7*  PO2ART 82.0* 68.0* 63.0*    Liver Enzymes  Recent Labs Lab 10/14/16 1942  AST 20  ALT 15  ALKPHOS 77  BILITOT 0.4  ALBUMIN 3.3*    Cardiac Enzymes  Recent Labs Lab 10/14/16 1300 10/14/16 1942 10/15/16 0329  TROPONINI <0.03 <0.03 <0.03    Glucose  Recent Labs Lab 10/15/16 0822  GLUCAP 65    Imaging No results found.   STUDIES:  CXR 10/30 >> no acute process   CULTURES:   ANTIBIOTICS:   SIGNIFICANT EVENTS: 10/30  Admit with inability to provide self care, mild hypercarbia / AMS  LINES/TUBES:  DISCUSSION: 55 y/o F with multiple medical problems admitted 10/30 with mild hypercarbia, suspected decompensated OHS/OSA, diastolic heart failure and limited ability to provide self care.   Overnight she has had a good response to lasix and diuresis.   ASSESSMENT / PLAN:  PULMONARY A: Chronic Hypercarbic Respiratory Failure - compensated on admit  OHS /  OSA - not compliant with CPAP at this point  COPD  Former Smoker Restrictive Lung Disease - prior spirometry consistent with moderate restriction in 2014 P:   BiPAP support PRN and at night Goal O2 sats 90-95%  Baseline PCO2 ~ 80  Intermittent CXR  Pulmonary hygiene - IS, mobilize  Minimize sedating medications   CARDIOVASCULAR A:  Chronic Diastolic CHF  BNP is low but can be misleading in setting of obesity. Hx HTN, HLD  PVD  Chronic Venous Stasis P:  Continue Lasix + Metolazone  RENAL A:   At Risk AKI - in the setting of diuresis  P:   Trend BMP  Diuresis as above  Replace electrolytes as indicated  Foley for I/O's   GASTROINTESTINAL A:   Morbid Obesity  P:   Consider nutrition consult  Consider bariatric assessment   HEMATOLOGIC A:   Mild Anemia  Thrombocytopenia  P:  Trend CBC  Monitor for bleeding   INFECTIOUS A:   No acute infectious process  P:   Monitor fever curve / WBC   ENDOCRINE A:   DM   P:   Monitor glucose / SSI   NEUROLOGIC A:   Chronic Low Back Pain  Depression  Fibromyalgia P:   RASS goal: n/a  Supportive care  Minimize sedation   SOCIAL A: Poor social support - recent d/c from SNF due to insurance  P:  Will need social work assessment for possible placement.  Due to her morbid obesity and home situation, she is not likely able to provide self care. I am very concerned about her going back home.    FAMILY  - Updates: No family at bedside.  Patient updated on plan of care.   - Inter-disciplinary family meet or Palliative Care meeting due by:  11/6.  PCCM will be available as needed. Please call with any questions.  Marshell Garfinkel MD [facility] Pager [phone] If no answer or after 3pm call: [phone] 10/15/2016, 10:43 AM

## 2016-10-15 NOTE — Progress Notes (Signed)
Family Medicine Teaching Service Daily Progress Note Intern Pager: [phone]  [name] Medical record number: [id number] Date of birth: 04-Aug-1961 Age: 55 y.o. Gender: female  Primary Care Provider: Evette Doffing, MD Consultants: None Code Status: FULL  Pt Overview and Major Events to Date:  Admit 10/30 to SDU  Assessment and Plan: Kathleen Roy is a 55 y.o. female presenting with difficulty walking at home. She desaturated in the ED and was admitted for observation. PMH is significant for morbid obesity, chronic respiratory failure 2/2 COPD/OHS/OSA (on 4L O2 at home), HFpEF, chronic venous stasis/chronic lymphedema, prediabetes, HTN, peripheral neuropathy, HLD, morbid obesity.  Acute on Chronic Hypoxemic Hypercarbic Respiratory Failure: Pt had a desaturation to 63% on her home 4L O2.  ABG with pH 7.341, pCO2 84.4, pO2 76, bicarb 45.8. Likely has a chronic respiratory acidosis from hypoventilation, and is partially compensated given her bicarb of 45.8.  Desaturation in the ED occurred when she was sleeping, likely secondary to uncontrolled OSA.   Other differentials include COPD exacerbation, given her wheezing, but Pt denies cough or sputum production. CHF exacerbation is also a possibility, but it is difficult to assess her fluid status given her body habitus.  BNP 17.2.  Opioid overdose is also a possibility, although patient states that she only took 1 Oxycodone at 2AM and has only been taking these every 6 hours.  ACS is a possibility, although patient denies chest pain and EKG negative for ST segment changes, with left anterior fasicular block and RBBB.  Trop negative <0.03 x 3.   - Pulm/CCM consulted, appreciate assistance --> bipap support prn, goal o2 sat 90-95%, intermittent CXRs, pulmonary hygiene (incentive spirometry), minimize sedating meds, lasix empirically with Metolazone - Transfer to med-surg  - Bipap prn  - Low threshold for intubation if respiratory status were  to worsen  - Continue Lasix IV 40mg  bid for now. Hold home Torsemide 40 mg bid PO - Duonebs q6hrs and Albuterol q4hrs prn   - Continue Spiriva (started 10/30)  - incentive spirometry Q4 while awake  - Holding home oxycodone until she becomes more alert - Consider bariatric assessment - Will contact [facility] to explore possible options for her.  - PT/OT to see - Continuous pulse ox  -c/s Palliative to discuss GoC, appreciate recs  Altered Mental Status: Resolved. Likely secondary to acute on chronic respiratory failure.  May also be related to the fact that Pt normally sleeps from 4AM to 2PM and she took her sleep medications around 2AM.  UDS negative.  TSH nl 3.08.  - Schedule Bipap. If mental status worsens, can consider AMS work-up.   Difficulty with ambulation: Has been a chronic problem for her secondary to her body habitus - PT/OT consult  - Case management consult for assistance with [facility] needs  -placed Foley 10/30 because unable to ambulate to the bathroom  HFpEF: Last ECHO 08/20/16 with EF 60-65%, G2DD. On Torsemide 40mg  bid at home. Unclear if Pt is volume overloaded given body habitus.  - Continue Lasix IV 40mg  bid  - s/p Metolazone 5mg  PO x 1.  Reassess fluid status if would benefit from more.  - Daily weights, strict I/O - wt on admission 390.  - I/O 10/30: 240/450   Chronic Venous Stasis/Chronic Lymphedema: Recently treated for bilateral lower extremity cellulitis.  - Remove unna boots - Unna boots need to be replaced prior to discharge  Prediabetes: Last A1c 6.3% on 06/04/16. On Glipizide 5mg  daily at home. -  daily morning CBG this AM 74.  Start SSI for CBG > 140  HTN: Well-controlled. BP 117/74.   - Lasix 40mg  IV bid as above  Peripheral Neuropathy: - Continue Lyrica 150 mg bid  HLD: Last lipid panel 01/16/16: Chol 119, TG 159, HDL 45, LDL 42 - Continue Pravastatin 40 mg qhs  Depression: - Continue home Effexor and Abilify   Chronic  pain: - Continue home Lyrica and Flexeril - Holding home Oxycodone 15mg  q6hrs prn for now  Mild anemia/Thrombocytopenia.  Stable.  Hgb on admission 9.7.  Platelets 133.  -daily CBC -monitor for bleeding  FEN/GI: Heart healthy carb-modified diet Prophylaxis: Lovenox  Disposition: Continue to monitor in SDU.  Dispo pending clinical improvement.   Subjective:  Patient alert and talkative.  States she is unable to move around but is feeling a little better than she did yesterday.  Reports yesterday was confusing for her and has no idea what has been going on the past few days due to her mental status changes.  Now on [place].   Objective: Temp:  [98.3 F (36.8 C)-98.6 F (37 C)] 98.3 F (36.8 C) (10/31 1200) Pulse Rate:  [75-94] 78 (10/31 1200) Resp:  [12-28] 18 (10/31 1100) BP: (73-123)/(45-97) 117/79 (10/31 1200) SpO2:  [70 %-100 %] 98 % (10/31 1200) FiO2 (%):  [35 %] 35 % (10/31 0334) Weight:  [390 lb (176.9 kg)] 390 lb (176.9 kg) (10/31 0320) Physical Exam: General: obese female sitting in hospital chair in NAD Eyes: PERRLA, EOMI ENTM: MMM, poor dental hygiene, o/p clear Neck: Supple, no cervical lymphadenopathy Cardiovascular: RRR, no MRG Respiratory: normal work of breathing with slight wheezing appreciated Gastrointestinal: +BS, soft, NTND, obese MSK: Significant lymphedema in LE bilaterally, chronic venous stasis skin changes present, unna boots in place Derm: chronic venous stasis changes, lower extremities are erythematous but not warm Neuro: AAOx3, no focal deficits,  CN 2-12 grossly intact Psych: Normal behavior, mood appropriate  Laboratory:  Recent Labs Lab 10/14/16 0512 10/14/16 1300 10/15/16 0329  WBC 4.3 4.3 3.7*  HGB 10.8* 10.4* 9.7*  HCT 37.7 35.6* 33.7*  PLT 156 133* 133*    Recent Labs Lab 10/14/16 0512 10/14/16 1300 10/14/16 1942 10/15/16 0329  NA 143  --   --  146*  K 3.8  --   --  3.6  CL 95*  --   --  98*  CO2 39*  --   --  39*  BUN 13   --   --  10  CREATININE 0.86 0.85  --  0.84  CALCIUM 9.2  --   --  9.0  PROT  --   --  6.4*  --   BILITOT  --   --  0.4  --   ALKPHOS  --   --  77  --   ALT  --   --  15  --   AST  --   --  20  --   GLUCOSE 83  --   --  74   Imaging/Diagnostic Tests: Dg Chest Portable 1 View  Result Date: 10/14/2016 CLINICAL DATA:  55 year old female with morbid obesity, difficulty walking, hypoxia. Recent admission for bacteremia and cellulitis. Initial encounter. EXAM: PORTABLE CHEST 1 VIEW COMPARISON:  Chest radiographs 01/16/2016 and earlier. FINDINGS: Portable AP semi upright view at 0801 hours. Large body habitus. Stable cardiomegaly and mediastinal contours. Stable lung volumes. Stable mild increased pulmonary interstitial markings diffusely. Otherwise, allowing for portable technique the lungs are clear. No pneumothorax or  pleural effusion. IMPRESSION: No acute cardiopulmonary abnormality. Electronically Signed   By: Genevie Ann M.D.   On: 10/14/2016 08:14   Lovenia Kim, MD 10/15/2016, 1:33 PM PGY-1, [place] Intern pager: [phone], text pages welcome

## 2016-10-15 NOTE — Evaluation (Signed)
Occupational Therapy Evaluation Patient Details Name: Kathleen Roy MRN: [id number] DOB: 02/08/1961 Today's Date: 10/15/2016    History of Present Illness Pt admitted from home after d/c from SNF 2 days prior with hypoxia and AMS requiring bipap. PMH: morbid obesity, chronic LE cellulitis, foot ulcer, depression, fibromyalgia, headaches, neuropathy, HTN, CHF, COPD.    Clinical Impression   Pt was dependent on SNF staff for ADL and walking in PT with a RW at least 75 feet prior to her return home. She presents with decreased activity tolerance, generalized weakness and impaired balance interfering with ability to perform independently. Pt does not have adequate support of her brother at home. Recommending SNF.   Follow Up Recommendations  SNF;Supervision/Assistance - 24 hour    Equipment Recommendations       Recommendations for Other Services       Precautions / Restrictions Precautions Precautions: Fall Restrictions Weight Bearing Restrictions: No      Mobility Bed Mobility Overal bed mobility: +2 for physical assistance;Needs Assistance Bed Mobility: Supine to Sit     Supine to sit: +2 for physical assistance;Min assist     General bed mobility comments: HOB up, increased time, assist for LEs to EOB and to raise trunk, heavy reliance on momentum and rail  Transfers Overall transfer level: Needs assistance Equipment used: Rolling walker (2 wheeled) Transfers: Sit to/from Stand Sit to Stand: +2 physical assistance;Min assist         General transfer comment: from elevated bed, assist to rise and steady    Balance                                            ADL Overall ADL's : Needs assistance/impaired Eating/Feeding: Independent;Bed level   Grooming: Set up;Sitting;Wash/dry hands;Wash/dry face   Upper Body Bathing: Sitting;Moderate assistance   Lower Body Bathing: Total assistance;Sit to/from stand   Upper Body Dressing : Minimal  assistance;Sitting   Lower Body Dressing: Total assistance;Bed level   Toilet Transfer: Minimal assistance;+2 for physical assistance;+2 for safety/equipment;RW;Ambulation;BSC   Toileting- Clothing Manipulation and Hygiene: Total assistance;Sit to/from stand       Functional mobility during ADLs: Minimal assistance;+2 for safety/equipment;Rolling walker       Vision     Perception     Praxis      Pertinent Vitals/Pain Pain Assessment: Faces Faces Pain Scale: Hurts even more Pain Location: B knees Pain Descriptors / Indicators: Aching Pain Intervention(s): Monitored during session;Repositioned     Hand Dominance Right   Extremity/Trunk Assessment Upper Extremity Assessment Upper Extremity Assessment: Overall WFL for tasks assessed   Lower Extremity Assessment Lower Extremity Assessment: Defer to PT evaluation       Communication Communication Communication: No difficulties   Cognition Arousal/Alertness: Awake/alert Behavior During Therapy: WFL for tasks assessed/performed Overall Cognitive Status: Within Functional Limits for tasks assessed                     General Comments       Exercises       Shoulder Instructions      Home Living Family/patient expects to be discharged to:: Private residence Living Arrangements: Other relatives (brother) Available Help at Discharge: Family;Available 24 hours/day Type of Home: House Home Access: Stairs to enter [employer] of Steps: 3 Entrance Stairs-Rails: Right;Left;None Home Layout: One level     Bathroom Shower/Tub: Tub/shower  unit   Bathroom Toilet: Standard     Home Equipment: Cane - single point;Bedside commode;Shower seat;Adaptive equipment (02--4L typically) Adaptive Equipment: Reacher;Long-handled sponge Additional Comments: reports [place] ordering a new BSC and RW      Prior Functioning/Environment Level of Independence: Needs assistance  Gait / Transfers Assistance  Needed: ambulating with walker with  PT at SNF 75 ft ADL's / Homemaking Assistance Needed: SNF staffed performed bathing and dressing, pt groomed an self fed, brother helped with IADL at home and bathing   Comments: Pt can only leave her home with ambulance transportation.  Does not have a w/c and has applied for a ramp to be built.        OT Problem List: Decreased strength;Decreased activity tolerance;Impaired balance (sitting and/or standing);Decreased knowledge of use of DME or AE;Decreased safety awareness;Obesity;Pain;Cardiopulmonary status limiting activity   OT Treatment/Interventions: Self-care/ADL training;DME and/or AE instruction;Therapeutic activities;Patient/family education;Balance training;Energy conservation    OT Goals(Current goals can be found in the care plan section) Acute Rehab OT Goals Patient Stated Goal: to be more independent OT Goal Formulation: With patient Time For Goal Achievement: 10/29/16 Potential to Achieve Goals: Good ADL Goals Pt Will Perform Grooming: with set-up;sitting (at sink) Pt Will Perform Upper Body Bathing: with set-up;with adaptive equipment;sitting Pt Will Perform Lower Body Bathing: with min assist;with adaptive equipment;sit to/from stand Pt Will Perform Upper Body Dressing: with set-up;sitting Pt Will Perform Lower Body Dressing: with min assist;sit to/from stand;with adaptive equipment Pt Will Transfer to Toilet: with supervision;ambulating;bedside commode Pt Will Perform Toileting - Clothing Manipulation and hygiene: with min assist;with adaptive equipment;sit to/from stand Additional ADL Goal #1: Pt will generalize energy conservation strategies in ADL independently.  OT Frequency: Min 2X/week   Barriers to D/C: Decreased caregiver support          Co-evaluation PT/OT/SLP Co-Evaluation/Treatment: Yes Reason for Co-Treatment: For patient/therapist safety   OT goals addressed during session: ADL's and self-care      End of  Session Equipment Utilized During Treatment: Oxygen;Rolling walker (6L) Nurse Communication: Mobility status  Activity Tolerance: Patient tolerated treatment well Patient left: in chair;with call bell/phone within reach   Time: [id number] OT Time Calculation (min): 33 min Charges:  OT General Charges $OT Visit: 1 Procedure OT Evaluation $OT Eval Moderate Complexity: 1 Procedure G-Codes: OT G-codes **NOT FOR INPATIENT CLASS** Functional Assessment Tool Used: clinical judgement Functional Limitation: Self care Self Care Current Status [id number]): At least 80 percent but less than 100 percent impaired, limited or restricted Self Care Goal Status [id number]): At least 20 percent but less than 40 percent impaired, limited or restricted  Malka So 10/15/2016, 10:30 AM  [phone]

## 2016-10-15 NOTE — Care Management Note (Signed)
Case Management Note  Patient Details  Name: Kathleen Roy MRN: [id number] Date of Birth: [birth year]  Subjective/Objective:    CHF, COPD, hypoxia, AMS                Action/Plan: Discharge Planning: NCM Spoke to pt and states she lives at home with brother, [name] # [phone]. States she has a RW at home that belongs to SNF. Has a oxygen, cane and old bedside commode. States she was at [facility] in the past. PT recommending SNF rehab. Pt states she will go back but not to [facility]. Contacted CSW with new referral. Pt had questions about transportation. Offered choice for [facility]. Pt states she had [facility] in the past. Waiting final recommendations for home.   PCP   Hyman Bible DODD MD   Expected Discharge Date:                Expected Discharge Plan:  [place]  In-House Referral:  Clinical Social Work  Discharge planning Services  CM Consult  Post Acute Care Choice:  Home Health Choice offered to:  Patient  DME Arranged:  [place] [facility] DME Agency:  [place] Arranged:  PT, OT, Nurse's Aide [place] Agency:  [place]  Status of Service:  In process, will continue to follow  If discussed at Long Length of Stay Meetings, dates discussed:    Additional Comments:  Erenest Rasher, RN 10/15/2016, 2:49 PM

## 2016-10-15 NOTE — Progress Notes (Signed)
Pt was transferred on bariatric bed into room 6E21. Reviewd 6E safety protocols and call bell in reach. Pt is placed on telemetry and notified central tele. Pt has foley cath due to diuresing patient. Pt has meal tray and is eating.

## 2016-10-15 NOTE — Progress Notes (Addendum)
Patient has requested the removal of her leg wraps (uno boots). States they have been wrapped since Friday and were suppose to be removed today. She will need evaluation for replacement. Will notify teaching services of need.

## 2016-10-16 DIAGNOSIS — M25561 Pain in right knee: Secondary | ICD-10-CM

## 2016-10-16 DIAGNOSIS — M25562 Pain in left knee: Secondary | ICD-10-CM

## 2016-10-16 DIAGNOSIS — G8929 Other chronic pain: Secondary | ICD-10-CM

## 2016-10-16 DIAGNOSIS — G4700 Insomnia, unspecified: Secondary | ICD-10-CM

## 2016-10-16 DIAGNOSIS — Z7189 Other specified counseling: Secondary | ICD-10-CM

## 2016-10-16 DIAGNOSIS — Z515 Encounter for palliative care: Secondary | ICD-10-CM

## 2016-10-16 LAB — BASIC METABOLIC PANEL
ANION GAP: 12 (ref 5–15)
BUN: 9 mg/dL (ref 6–20)
CHLORIDE: 93 mmol/L — AB (ref 101–111)
CO2: 38 mmol/L — AB (ref 22–32)
CREATININE: 0.88 mg/dL (ref 0.44–1.00)
Calcium: 9.2 mg/dL (ref 8.9–10.3)
GFR calc non Af Amer: >60 mL/min (ref >60)
Glucose, Bld: 85 mg/dL (ref 65–99)
POTASSIUM: 3.2 mmol/L — AB (ref 3.5–5.1)
SODIUM: 143 mmol/L (ref 135–145)

## 2016-10-16 LAB — CBC
HEMATOCRIT: 33.8 % — AB (ref 36.0–46.0)
HEMOGLOBIN: 10 g/dL — AB (ref 12.0–15.0)
MCH: 28.3 pg (ref 26.0–34.0)
MCHC: 29.6 g/dL — ABNORMAL LOW (ref 30.0–36.0)
MCV: 95.8 fL (ref 78.0–100.0)
PLATELETS: 131 10*3/uL — AB (ref 150–400)
RBC: 3.53 MIL/uL — AB (ref 3.87–5.11)
RDW: 16.3 % — ABNORMAL HIGH (ref 11.5–15.5)
WBC: 4.9 10*3/uL (ref 4.0–10.5)

## 2016-10-16 LAB — GLUCOSE, CAPILLARY: Glucose-Capillary: 81 mg/dL (ref 65–99)

## 2016-10-16 LAB — TROPONIN I: Troponin I: <0.03 ng/mL (ref <0.03)

## 2016-10-16 MED ORDER — TRAZODONE HCL 100 MG PO TABS
300.0000 mg | ORAL_TABLET | Freq: Every day | ORAL | Status: DC
  Administered 2016-10-16 – 2016-10-20 (×5): 300 mg via ORAL
  Filled 2016-10-16 (×5): qty 3

## 2016-10-16 MED ORDER — OXYCODONE HCL 5 MG PO TABS
5.0000 mg | ORAL_TABLET | Freq: Four times a day (QID) | ORAL | Status: DC | PRN
  Administered 2016-10-16 – 2016-10-18 (×4): 5 mg via ORAL
  Filled 2016-10-16 (×6): qty 1

## 2016-10-16 MED ORDER — LIDOCAINE 5 % EX PTCH
2.0000 | MEDICATED_PATCH | CUTANEOUS | Status: DC
  Administered 2016-10-16 – 2016-10-21 (×6): 2 via TRANSDERMAL
  Filled 2016-10-16 (×6): qty 2

## 2016-10-16 MED ORDER — POTASSIUM CHLORIDE CRYS ER 20 MEQ PO TBCR
40.0000 meq | EXTENDED_RELEASE_TABLET | Freq: Two times a day (BID) | ORAL | Status: AC
  Administered 2016-10-16 – 2016-10-17 (×3): 40 meq via ORAL
  Filled 2016-10-16 (×3): qty 2

## 2016-10-16 NOTE — Progress Notes (Signed)
Family Medicine Teaching Service Daily Progress Note Intern Pager: [phone]  [name] Medical record number: [id number] Date of birth: 1961-07-28 Age: 55 y.o. Gender: female  Primary Care Provider: Evette Doffing, MD Consultants: None Code Status: FULL  Pt Overview and Major Events to Date:  Admit 10/30 to SDU  Assessment and Plan: Kathleen Roy is a 55 y.o. female presenting with difficulty walking at home. She desaturated in the ED and was admitted for observation. PMH is significant for morbid obesity, chronic respiratory failure 2/2 COPD/OHS/OSA (on 4L O2 at home), HFpEF, chronic venous stasis/chronic lymphedema, prediabetes, HTN, peripheral neuropathy, HLD, morbid obesity.  Acute on Chronic Hypoxemic Hypercarbic Respiratory Failure: Improving.  Patient on 4L O2 by [place].  Transferred to med-surg floor 10/31.  Refused Bipap 10/31 but satting well on 4L O2 at 91-98%.  Per CCM recs,  goal o2 sat 90-95%, intermittent CXRs, pulmonary hygiene (incentive spirometry), minimize sedating meds, lasix empirically with Metolazone.  Appreciate assistance of CCM in care of this patient.   - Low threshold for intubation if respiratory status were to worsen  - Continue Lasix IV 40mg  bid for now - Duonebs q6hrs and Albuterol q4hrs prn   - Continue Spiriva (started 10/30)  - incentive spirometry Q4 while awake  - Consider bariatric assessment - Will contact [facility] to explore possible options for her.  - PT- recommended SNF placement  --> will need to discuss with SW if SNF placement is an option  - c/s Palliative to discuss GoC, appreciate recs  Altered Mental Status: Resolved. Likely secondary to acute on chronic respiratory failure.  May also be related to the fact that Pt normally sleeps from 4AM to 2PM and she took her sleep medications around 2AM.  UDS negative.  TSH nl 3.08.  - Continue to monitor  Difficulty with ambulation: Has been a chronic problem for her  secondary to her body habitus - PT+OT recommending SNF   - Case management consult for assistance with [facility] needs  -placed Foley 10/30 because unable to ambulate to the bathroom  HFpEF: Last ECHO 08/20/16 with EF 60-65%, G2DD. On Torsemide 40mg  bid at home. Unclear if Pt is volume overloaded given body habitus.  - Continue Lasix IV 40mg  bid  -Consider Metolazone if not diuresing enough  - Daily weights, strict I/O - wt on admission 390.  - I/O : 486/2450  Net output -9.2 L since admit  Chronic Venous Stasis/Chronic Lymphedema: Recently treated for bilateral lower extremity cellulitis.  - Unna boots replaced 10/31  Prediabetes: Last A1c 6.3% on 06/04/16. On Glipizide 5mg  daily at home. - daily morning CBG this AM 81.  Start SSI for CBG > 140   HTN: Well-controlled. BP 117/74.   - Lasix 40mg  IV bid as above  Peripheral Neuropathy: - Continue Lyrica 150 mg bid  HLD: Last lipid panel 01/16/16: Chol 119, TG 159, HDL 45, LDL 42 - Continue Pravastatin 40 mg qhs  Depression: - Continue home Effexor and Abilify   Chronic pain: - Continue home Lyrica and Flexeril  - Resume home Oxycodone but at lower dose for now (Oxy 5 mg q6hrs prn)   Mild anemia/Thrombocytopenia.  Stable.  Hgb on admission 9.7.  Platelets 133.  -daily CBC -monitor for bleeding  Difficulty sleeping.  At home on Trazodone 300 mg -Resume Trazodone 300 mg daily at bedtime   FEN/GI: Heart healthy carb-modified diet Prophylaxis: Lovenox  Disposition: Continue to monitor in SDU.  Dispo pending clinical improvement.  Subjective:  Patient alert and talkative.  States she has bilateral knee pain and is usually on Oxy at home.  Has not been getting it while here due to her mental status.  Reports she otherwise feels ok with no issues breathing.  Is currently on 4 L O2.  Unable to sleep much last night (about 3 hours) and would also like to resume her home Trazodone if possible.   Objective: Temp:  [98.1 F (36.7  C)-98.6 F (37 C)] 98.6 F (37 C) (11/01 0450) Pulse Rate:  [70-84] 75 (11/01 0450) Resp:  [11-20] 17 (11/01 0450) BP: (72-127)/(46-101) 117/66 (11/01 0450) SpO2:  [93 %-100 %] 94 % (11/01 0901) Physical Exam: General: obese female sitting in hospital chair in NAD Eyes: PERRLA, EOMI ENTM: MMM, poor dental hygiene, o/p clear Neck: Supple, no cervical LAD Cardiovascular: RRR, no MRG Respiratory: normal work of breathing with slight wheezing appreciated Gastrointestinal: +BS, soft, NTND, obese MSK: Significant lymphedema in LE bilaterally, chronic venous stasis, unna boots in place Derm:  lower extremities are erythematous but not warm Neuro: AAOx3, no focal deficits,  CN 2-12 grossly intact Psych: Normal behavior, mood appropriate  Laboratory:  Recent Labs Lab 10/14/16 1300 10/15/16 0329 10/16/16 0610  WBC 4.3 3.7* 4.9  HGB 10.4* 9.7* 10.0*  HCT 35.6* 33.7* 33.8*  PLT 133* 133* 131*    Recent Labs Lab 10/14/16 0512 10/14/16 1300 10/14/16 1942 10/15/16 0329 10/16/16 0610  NA 143  --   --  146* 143  K 3.8  --   --  3.6 3.2*  CL 95*  --   --  98* 93*  CO2 39*  --   --  39* 38*  BUN 13  --   --  10 9  CREATININE 0.86 0.85  --  0.84 0.88  CALCIUM 9.2  --   --  9.0 9.2  PROT  --   --  6.4*  --   --   BILITOT  --   --  0.4  --   --   ALKPHOS  --   --  77  --   --   ALT  --   --  15  --   --   AST  --   --  20  --   --   GLUCOSE 83  --   --  74 85   Imaging/Diagnostic Tests: No results found. Lovenia Kim, MD 10/16/2016, 9:49 AM PGY-1, [place] Intern pager: [phone], text pages welcome

## 2016-10-16 NOTE — Progress Notes (Signed)
Pt has stopped her telemetry due to allergic reaction to sticker leads. Nurse has removed per pt request after education and safety was relayed to pt. [facility] [place] and Dr Ree Kida

## 2016-10-16 NOTE — Progress Notes (Signed)
   10/16/16 1400  Clinical Encounter Type  Visited With Patient not available  Visit Type Initial  Referral From Palliative care team  Stress Factors  Patient Stress Factors None identified  Chaplain responded to consult. Pt. Resting and will follow up.

## 2016-10-16 NOTE — Progress Notes (Signed)
Patient refused CPAP for the night. Patient wearing oxygen set at 4lpm with Sp02=96%. Will continue to monitor patients status.

## 2016-10-16 NOTE — Consult Note (Signed)
Consultation Note Date: 10/16/2016   [name]  DOB: 09-Aug-1961  MRN: [id number]  Age / Sex: 55 y.o., female  PCP: Sela Hua, MD Referring Physician: Dickie La, MD  Reason for Consultation: Establishing goals of care  HPI/Patient Profile: 55 y.o. female  with past medical history of CHFpEF, COPD, morbid obesity, chronic knee and back pain, depression, OSA, chronic lymphedema, venous stasis, HTN, HLD admitted on 10/14/2016 with hypoxia. Workup this admission reveals chronic respiratory failure, chronic respiratory acidosis. Hypoxia is attributed to OSA. Patient admitted for continued workup.  Clinical Assessment and Goals of Care: Patient presented to ED via EMS due to calling EMS several times for mobility assistance in her home. She was recently discharged from [facility] to her home where she lives with her brother. She is unable to climb her stairs to her home, and is unable to ambulate in her home. She attributes the difficulty ambulating to her chronic knee and low back pain that she has had for over seventeen years. She notes having to sit down in her kitchen in order to make her favorite food which is a ham sandwich. Her goals would be to return to SNF for continued rehab with desire to return home, however, she notes she cannot return home yet as she is unable to get around in her home. Patient has had depression her entire life. This was worsened by her mother's death in 1999/04/05. She denies suicidality. 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 She currently does not work and is disabled. She is spiritual and states she feels that when she dies it will be her time to die. Discussed advance care planning. Her brother would be her primary Media planner. She desires to complete Advance Directives  and name him HCPOA. She would not want heroic measures if she were to die. She understands she has several comorbidities that would render resuscitation of little benefit and would likely leave her in a worse condition then if she were to die a natural death. She does not want artificial nutrition. I will return to see her tomorrow and complete Advance Directives with her. She is concerned about her brother's health. He is having a biopsy of Friday of a mass felt to possibly be prostate cancer. Her main symptomatic complaints are her lack of mobility and her chronic pain. She has a wheelchair but notes it is a transport wheelchair and lacks the extra wheel she needs to mobilize herself, it must be pushed. She takes oxycodone for pain at home with some relief. She has insomnia, noting she takes trazadone at home for sleep. She denies SOB.   Primary Decision Maker PATIENT     SUMMARY OF RECOMMENDATIONS -DNR -Lidoderm patches to bil knees q 24 hrs. Leave on for 12 hours and remove for 12 hours. -Resume trazadone as at home -Spiritual care consult for support -Recommend SNF for rehab, will  discuss community palliative f/u with her tomorrow    Code Status/Advance Care Planning:  DNR    Symptom Management:   As above    Additional Recommendations (Limitations, Scope, Preferences):  No Artificial Feeding and No Tracheostomy  Psycho-social/Spiritual:   Desire for further Chaplaincy support:Yes  Additional Recommendations: None  Prognosis:    Unable to determine  Discharge Planning: [place] for rehab with Palliative care service follow-up  Primary Diagnoses: Present on Admission: . Hypoxia . Acute respiratory failure with hypoxia and hypercarbia ([place])   I have reviewed the medical record, interviewed the patient and family, and examined the patient. The following aspects are pertinent.  Past Medical History:  Diagnosis Date  . Arthritis    "I feel like  it's everywhere" (11/08/2014)  . Cellulitis and abscess of lower extremity 08/16/2016   right leg  . Chronic lower back pain   . COPD (chronic obstructive pulmonary disease) ([place])   . Depression   . Diabetes mellitus without complication ([place])    type 2   . Fibromyalgia   . Headache    "at least weekly; it's usually when I first wake up" (11/08/2014)  . Hyperlipidemia   . Hypertension   . HYPERTENSION, BENIGN SYSTEMIC 02/12/2007  . Memory disturbance   . Migraines 1970's - <2000   "they just went away"  . Neuropathy ([place])   . Normal echocardiogram 05/30/05   suboptimal study  . Obesity, morbid (more than 100 lbs over ideal weight or BMI > 40) ([facility])    obese since childhood  . Osteoarthritis   . Peripheral edema   . Pressure ulcer of foot    left  . Sepsis ([place])   . Shortness of breath dyspnea   . Sleep apnea    "recently dx'd; haven't got my equipment yet" (11/08/2014)   Social History   Social History  . Marital status: Single    Spouse name: N/A  . Number of children: 0  . Years of education: college-1   Occupational History  . Disabled     severe depression and morbid obesity   Social History Main Topics  . Smoking status: Former Smoker    Packs/day: 1.00    Years: 30.00    Types: Cigarettes    Quit date: 01/05/2009  . Smokeless tobacco: Never Used  . Alcohol use No  . Drug use: No  . Sexual activity: No   Other Topics Concern  . None   Social History Narrative   Lives alone; has one brother.  Both of them live in poverty.  Brother does all her shopping and takes her to doctors apts.  Otherwise patient is confined to her home.      Patient does not drink caffeine.   Patient is right handed.   Family History  Problem Relation Age of Onset  . Stroke Mother   . Dementia Father   . Prostate cancer Father   . Congestive Heart Failure Brother   . Diabetes Brother   . Hypertension Brother    Scheduled Meds: . ARIPiprazole  2 mg Oral QODAY  .  enoxaparin (LOVENOX) injection  85 mg Subcutaneous Q24H  . furosemide  40 mg Intravenous BID  . hydrocerin  1 application Topical BID  . ipratropium-albuterol  3 mL Nebulization Q6H  . lidocaine  2 patch Transdermal Q24H  . loratadine  10 mg Oral Daily  . pravastatin  40 mg Oral QHS  . pregabalin  150 mg Oral BID  .  sodium chloride flush  3 mL Intravenous Q12H  . sodium chloride flush  3 mL Intravenous Q12H  . tiotropium  18 mcg Inhalation Daily  . traZODone  300 mg Oral QHS  . venlafaxine XR  300 mg Oral Daily   Continuous Infusions:  PRN Meds:.sodium chloride, acetaminophen **OR** acetaminophen, albuterol, cyclobenzaprine, nystatin, ondansetron **OR** ondansetron (ZOFRAN) IV, oxyCODONE, polyethylene glycol, sodium chloride flush Medications Prior to Admission:  Prior to Admission medications   Medication Sig Start Date End Date Taking? Authorizing Provider  ARIPiprazole (ABILIFY) 2 MG tablet take 1 tablet by mouth every other day Patient taking differently: Take 2 mg by mouth every other day 03/08/16   Sela Hua, MD  Ascorbic Acid (VITAMIN C) 1000 MG tablet Take 3,000 mg by mouth at bedtime.     Historical Provider, MD  atorvastatin (LIPITOR) 10 MG tablet Take 10 mg by mouth at bedtime.    Historical Provider, MD  cetirizine (ZYRTEC) 10 MG tablet Take 10 mg by mouth daily.    Historical Provider, MD  cyclobenzaprine (FLEXERIL) 10 MG tablet Take 1 tablet (10 mg total) by mouth 3 (three) times daily as needed for muscle spasms. 06/17/16   Sela Hua, MD  glipiZIDE (GLUCOTROL) 5 MG tablet take 1 tablet by mouth once daily Patient taking differently: Take 5 mg by mouth every morning 06/28/16   Sela Hua, MD  hydrocerin (EUCERIN) CREA Apply 1 application topically 2 (two) times daily. 08/22/16   Steve Rattler, DO  NONFORMULARY OR COMPOUNDED ITEM Mineral Oil/Eucerin cream: Apply to affected area twice a day    Historical Provider, MD  nystatin (MYCOSTATIN) powder Apply topically 2  (two) times daily. Apply to affected skin BID Patient taking differently: Apply topically 2 (two) times daily as needed. TO AFFECTED AREAS 10/26/15   [name], MD  Omega-3 Fatty Acids (FISH OIL) 1000 MG CAPS Take 2 capsules (2,000 mg total) by mouth at bedtime. 06/10/16   Sela Hua, MD  oxyCODONE (ROXICODONE) 15 MG immediate release tablet Take 0.5 tablets (7.5 mg total) by mouth every 6 (six) hours as needed for severe pain. 08/22/16   Steve Rattler, DO  oxyCODONE-acetaminophen (PERCOCET) 10-325 MG tablet Take 1 tablet by mouth every 8 (eight) hours.    Historical Provider, MD  OXYGEN Inhale into the lungs. 4l/min    Historical Provider, MD  polyethylene glycol (MIRALAX / GLYCOLAX) packet Take 17 g by mouth at bedtime.     Historical Provider, MD  potassium chloride SA (K-DUR,KLOR-CON) 20 MEQ tablet Take 2 tablets (40 mEq total) by mouth daily. 06/04/16   Sela Hua, MD  pravastatin (PRAVACHOL) 40 MG tablet Take 1 tablet (40 mg total) by mouth at bedtime. Patient not taking: Reported on 10/14/2016 04/02/16   Sela Hua, MD  pregabalin (LYRICA) 150 MG capsule Take 1 capsule (150 mg total) by mouth 2 (two) times daily. 08/22/16   Steve Rattler, DO  torsemide (DEMADEX) 20 MG tablet take 2 tablets by mouth twice a day Patient taking differently: Take 40 mg by mouth two times a day 03/08/16   Sela Hua, MD  traZODone (DESYREL) 150 MG tablet Take 2 tablets (300 mg total) by mouth at bedtime. 04/02/16   Sela Hua, MD  Vancomycin (VANCOCIN) 750-5 MG/150ML-% SOLN Inject 150 mLs (750 mg total) into the vein every 12 (twelve) hours. Start 08/22/16 at 2200, last dose 08/31/16 at 2200 Patient not taking: Reported on 10/14/2016 08/22/16  Steve Rattler, DO  venlafaxine XR (EFFEXOR-XR) 150 MG 24 hr capsule Take 2 capsules (300 mg total) by mouth daily. 08/07/16   Sela Hua, MD   Allergies  Allergen Reactions  . Aspirin Hives    REACTION: hives Hasn't had in years    Review of  Systems  Constitutional: Positive for activity change, appetite change and fatigue.  Eyes: Negative.   Respiratory: Positive for wheezing.   Cardiovascular: Negative.   Gastrointestinal: Positive for abdominal distention and diarrhea.  Endocrine: Negative.   Genitourinary: Negative.   Musculoskeletal: Positive for arthralgias, back pain, gait problem and myalgias.  Skin: Negative.   Allergic/Immunologic: Negative.   Neurological: Positive for headaches.  Hematological: Negative.   Psychiatric/Behavioral: Positive for sleep disturbance. Negative for suicidal ideas.    Physical Exam  Constitutional: She is oriented to person, place, and time.  obese  Cardiovascular:  distant  Pulmonary/Chest: She has wheezes. She has rales.  Abdominal: Soft. Bowel sounds are normal.  Musculoskeletal: She exhibits edema.  Neurological: She is alert and oriented to person, place, and time.  Skin: Skin is warm and dry.  Psychiatric: She has a normal mood and affect. Her behavior is normal. Judgment and thought content normal.    Vital Signs: BP 121/67 (BP Location: Right Wrist)   Pulse 84   Temp 98 F (36.7 C) (Oral)   Resp 17   Ht 5\' 2"  (1.575 m)   Wt (!) 176.9 kg (390 lb)   SpO2 91%   BMI 71.33 kg/m  Pain Assessment: No/denies pain POSS *See Group Information*: 1-Acceptable,Awake and alert Pain Score: 3    SpO2: SpO2: 91 % O2 Device:SpO2: 91 % O2 Flow Rate: .O2 Flow Rate (L/min): 4 L/min  IO: Intake/output summary:  Intake/Output Summary (Last 24 hours) at 10/16/16 1310 Last data filed at 10/16/16 1050  Gross per 24 hour  Intake              726 ml  Output             6350 ml  Net            -5624 ml    LBM: Last BM Date: 10/16/16 Baseline Weight: Weight: (!) 176.9 kg (390 lb) Most recent weight: Weight: (!) 176.9 kg (390 lb)     Palliative Assessment/Data: PPS: 30%     Thank you for this consult. Palliative medicine will continue to follow and assist as needed.   Time  In: 1100 Time Out: 1215 Time Total: 75 mins Greater than 50%  of this time was spent counseling and coordinating care related to the above assessment and plan.  Signed by: Mariana Kaufman, AGNP-C Palliative Medicine    Please contact Palliative Medicine Team phone at [phone] for questions and concerns.  For individual provider: See Shea Evans

## 2016-10-16 NOTE — Progress Notes (Signed)
DNR Bracelet placed on pts right arm per Dr's order

## 2016-10-17 LAB — BASIC METABOLIC PANEL
ANION GAP: 9 (ref 5–15)
BUN: 9 mg/dL (ref 6–20)
CHLORIDE: 92 mmol/L — AB (ref 101–111)
CO2: 41 mmol/L — AB (ref 22–32)
Calcium: 9 mg/dL (ref 8.9–10.3)
Creatinine, Ser: 0.8 mg/dL (ref 0.44–1.00)
GFR calc Af Amer: >60 mL/min (ref >60)
GFR calc non Af Amer: >60 mL/min (ref >60)
GLUCOSE: 86 mg/dL (ref 65–99)
POTASSIUM: 3.4 mmol/L — AB (ref 3.5–5.1)
Sodium: 142 mmol/L (ref 135–145)

## 2016-10-17 LAB — CBC
HEMATOCRIT: 33.9 % — AB (ref 36.0–46.0)
Hemoglobin: 10 g/dL — ABNORMAL LOW (ref 12.0–15.0)
MCH: 28.3 pg (ref 26.0–34.0)
MCHC: 29.5 g/dL — ABNORMAL LOW (ref 30.0–36.0)
MCV: 96 fL (ref 78.0–100.0)
Platelets: 133 10*3/uL — ABNORMAL LOW (ref 150–400)
RBC: 3.53 MIL/uL — AB (ref 3.87–5.11)
RDW: 16.4 % — AB (ref 11.5–15.5)
WBC: 3.7 10*3/uL — AB (ref 4.0–10.5)

## 2016-10-17 LAB — GLUCOSE, CAPILLARY: GLUCOSE-CAPILLARY: 84 mg/dL (ref 65–99)

## 2016-10-17 MED ORDER — IPRATROPIUM-ALBUTEROL 0.5-2.5 (3) MG/3ML IN SOLN
3.0000 mL | Freq: Three times a day (TID) | RESPIRATORY_TRACT | Status: DC
  Administered 2016-10-17 – 2016-10-21 (×11): 3 mL via RESPIRATORY_TRACT
  Filled 2016-10-17 (×13): qty 3

## 2016-10-17 MED ORDER — POTASSIUM CHLORIDE CRYS ER 20 MEQ PO TBCR
40.0000 meq | EXTENDED_RELEASE_TABLET | Freq: Every day | ORAL | Status: DC
  Administered 2016-10-18 – 2016-10-21 (×4): 40 meq via ORAL
  Filled 2016-10-17 (×4): qty 2

## 2016-10-17 MED ORDER — TORSEMIDE 20 MG PO TABS
40.0000 mg | ORAL_TABLET | Freq: Two times a day (BID) | ORAL | Status: DC
  Administered 2016-10-17 – 2016-10-21 (×10): 40 mg via ORAL
  Filled 2016-10-17 (×10): qty 2

## 2016-10-17 MED ORDER — ALBUTEROL SULFATE HFA 108 (90 BASE) MCG/ACT IN AERS: 2.0000 | INHALATION_SPRAY | RESPIRATORY_TRACT | 2 refills | Status: DC | PRN

## 2016-10-17 MED ORDER — TIOTROPIUM BROMIDE MONOHYDRATE 18 MCG IN CAPS: 18.0000 ug | ORAL_CAPSULE | Freq: Every day | RESPIRATORY_TRACT | 2 refills | Status: DC

## 2016-10-17 NOTE — Progress Notes (Signed)
Daily Progress Note   [name]       Date: 10/17/2016 DOB: 1961/07/01  Age: 55 y.o. MRN#: [id number] Attending Physician: Dickie La, MD Primary Care Physician: Evette Doffing, MD Admit Date: 10/14/2016  Reason for Consultation/Follow-up: Establishing goals of care  Subjective: Patient was seen and examined this afternoon. She continues to have shortness of breath and cough. She has been using the lidoderm patches on her knees, but has not been out of bed yet today to assess how the pain is. She would like to complete the advanced directives form which was discussed yesterday.   Length of Stay: 2  Current Medications: Scheduled Meds:  . ARIPiprazole  2 mg Oral QODAY  . enoxaparin (LOVENOX) injection  85 mg Subcutaneous Q24H  . hydrocerin  1 application Topical BID  . ipratropium-albuterol  3 mL Nebulization Q6H  . lidocaine  2 patch Transdermal Q24H  . loratadine  10 mg Oral Daily  . potassium chloride  40 mEq Oral Daily  . pravastatin  40 mg Oral QHS  . pregabalin  150 mg Oral BID  . sodium chloride flush  3 mL Intravenous Q12H  . sodium chloride flush  3 mL Intravenous Q12H  . tiotropium  18 mcg Inhalation Daily  . torsemide  40 mg Oral BID  . traZODone  300 mg Oral QHS  . venlafaxine XR  300 mg Oral Daily    PRN Meds: sodium chloride, acetaminophen **OR** acetaminophen, albuterol, cyclobenzaprine, nystatin, ondansetron **OR** ondansetron (ZOFRAN) IV, oxyCODONE, polyethylene glycol, sodium chloride flush  Physical Exam         Vitals:   10/16/16 2054 10/17/16 0626 10/17/16 0752 10/17/16 1003  BP: 119/62 104/65  102/67  Pulse: 80 67  80  Resp: _0 Temp: 98.5 F (36.9 C)   98 F (36.7 C)  TempSrc: Oral   Oral  SpO2: 98% 99% 98% 99%  Weight:    (!)  391 lb 1.5 oz (177.4 kg)  Height:       General: Vital signs reviewed.  Patient is obese, in no acute distress and cooperative with exam.  Pulmonary/Chest: Non-labored, normal respirations, some audible wheezes.  Abdominal: Obese Neurological: Tremor in right hand. Awake, alert, answers all questions appropriately.  Psychiatric: Normal mood and affect. speech and  behavior is normal. Cognition and memory are normal.   Intake/output summary:   Intake/Output Summary (Last 24 hours) at 10/17/16 1444 Last data filed at 10/17/16 1345  Gross per 24 hour  Intake             1675 ml  Output             5500 ml  Net            -3825 ml   LBM: Last BM Date: 10/16/16 Baseline Weight: Weight: (!) 390 lb (176.9 kg) Most recent weight: Weight: (!) 391 lb 1.5 oz (177.4 kg)       Palliative Assessment/Data: 50%      Patient Active Problem List   Diagnosis Date Noted  . Insomnia   . Palliative care by specialist   . Advance care planning   . Chronic pain of both knees   . Acute respiratory failure with hypoxia and hypercarbia ([place]) 10/15/2016  . General weakness   . Hypoxia 10/14/2016  . Bacteremia   . Cellulitis of right lower extremity 06/04/2016  . Type 2 diabetes mellitus ([place]) 02/14/2016  . Pressure ulcer 01/16/2016  . Acute on chronic respiratory failure with hypercapnia ([place])   . Chronic diastolic congestive heart failure ([place])   . Essential hypertension   . CAP (community acquired pneumonia) 01/15/2016  . Bilateral knee pain 03/22/2015  . OSA (obstructive sleep apnea)   . Obesity hypoventilation syndrome ([place])   . Chronic respiratory failure with hypoxia ([place])   . Assistance needed for grooming 11/08/2014  . Fatigue 11/03/2014  . Memory loss 03/18/2014  . Tremor 03/18/2014  . Vulvovaginal candidiasis 11/25/2013  . Restrictive airway disease 08/26/2013  . Hypokalemia 08/19/2013  . Health care maintenance 06/17/2013  . Venous stasis dermatitis of both lower extremities  12/02/2012  . COPD (chronic obstructive pulmonary disease) ([place]) 10/15/2011  . Peripheral neuropathy ([place]) 10/15/2011  . KNEE PAIN, BILATERAL 01/22/2011  . BACK PAIN, LUMBAR, CHRONIC 01/22/2011  . MIGRAINE HEADACHE 10/25/2010  . HYPERLIPIDEMIA 04/07/2009  . EDEMA, CHRONIC 06/27/2008  . Morbid obesity ([place]) 11/16/2007  . DEPRESSION, MAJOR, RECURRENT 02/12/2007  . PANIC ATTACKS 02/12/2007  . HYPERTENSION, BENIGN SYSTEMIC 02/12/2007    Palliative Care Assessment & Plan   Patient Profile: 55 y.o. female  with past medical history of CHFpEF, COPD, morbid obesity, chronic knee and back pain, depression, OSA, chronic lymphedema, venous stasis, HTN, HLD admitted on 10/14/2016 with hypoxia. Workup this admission reveals chronic respiratory failure, chronic respiratory acidosis. Hypoxia is attributed to OSA. Patient admitted for continued workup.  Assessment: Per consult note, patient originally presented to [facility] after calling EMS several times for mobility assistance in her home. She was unable to get off of the couch and to the restroom due to pain and immobility. Patient lives at home with her brother, but was recently staying at [facility] from which she was recently discharged to her Medicare days running out. She was recently discharged from [facility] SNF to her home where she lives with her brother. Patient was admitted for acute on chronic hypoxemic hypercarbic respiratory failure and has been treated with diuretics, Duonebs, and Spiriva. After discussion of goals of care yesterday with Mariana Kaufman, NP, patient desires to be DNR and would not want be intubated if her respiratory status were to worsen.   We met with patient today to fill out advanced directives at her wish. Patient has named her brother, Eda Magnussen, as her HCPOA. We went through  the documents today and they were completed in full.   Recommendations/Plan:  Consult placed to Chaplain for Advanced Care Directives to be  notarized and signed.   Further medical care and disposition per primary team   DNR  Continue lidoderm patches on bilateral knees for relief  Continue Trazodone  Goals of Care and Additional Recommendations:  Limitations on Scope of Treatment: No Artificial Feeding and No Tracheostomy  Code Status:  DNR  Prognosis:   > 12 months  Discharge Planning:  [place] for rehab with Palliative care service follow-up  Care plan was discussed with patient and Mariana Kaufman, NP.   Thank you for allowing the Palliative Medicine Team to assist in the care of this patient.   Time In: 1:30 pm Time Out: 2:15 pm Total Time 45 minutes Prolonged Time Billed  no       Greater than 50%  of this time was spent counseling and coordinating care related to the above assessment and plan.  Martyn Malay, DO PGY-3 Internal Medicine Resident Pager # [phone] 10/17/2016 2:44 PM  Please contact Palliative MedicineTeam phone at [phone] for questions and concerns.   Please see AMION for individual provider pager numbers.

## 2016-10-17 NOTE — Progress Notes (Signed)
Physical Therapy Treatment Patient Details Name: Kathleen Roy MRN: [id number] DOB: [birth year] Today's Date: 10/17/2016    History of Present Illness Pt admitted from home after d/c from SNF 2 days prior with hypoxia and AMS requiring bipap. PMH: morbid obesity, chronic LE cellulitis, foot ulcer, depression, fibromyalgia, headaches, neuropathy, HTN, CHF, COPD.     PT Comments    Participated well today by pushing herself to complete bed mobility to EOB, and progressing well with gait using the RW  Follow Up Recommendations  SNF     Equipment Recommendations  Other (comment) (TBA, Will need bari RW)    Recommendations for Other Services       Precautions / Restrictions Precautions Precautions: Fall    Mobility  Bed Mobility Overal bed mobility: Needs Assistance Bed Mobility: Supine to Sit           General bed mobility comments: HOB up, increased time, assist for LEs to EOB, and use of rail  Transfers Overall transfer level: Needs assistance Equipment used: Rolling walker (2 wheeled) Transfers: Sit to/from Stand Sit to Stand: Min assist;+2 safety/equipment         General transfer comment: did not need elevated surface today, assist to rise and steady  Ambulation/Gait Ambulation/Gait assistance: Min assist;+2 safety/equipment Ambulation Distance (Feet): 25 Feet Assistive device: Rolling walker (2 wheeled) Gait Pattern/deviations: Step-through pattern Gait velocity: decr Gait velocity interpretation: Below normal speed for age/gender General Gait Details: steady assist and guard for safety   Stairs            Wheelchair Mobility    Modified Rankin (Stroke Patients Only)       Balance Overall balance assessment: Needs assistance   Sitting balance-Leahy Scale: Fair       Standing balance-Leahy Scale: Poor Standing balance comment: reliant on the RW and occasional external support                    Cognition Arousal/Alertness:  Awake/alert Behavior During Therapy: WFL for tasks assessed/performed Overall Cognitive Status: Within Functional Limits for tasks assessed                      Exercises Other Exercises Other Exercises: warm up hip/knee ROM exercise with some resistance prior to mobility    General Comments        Pertinent Vitals/Pain Pain Assessment: Faces Faces Pain Scale: Hurts little more Pain Location: bil LE Pain Descriptors / Indicators: Grimacing Pain Intervention(s): Monitored during session;Repositioned    Home Living                      Prior Function            PT Goals (current goals can now be found in the care plan section) Acute Rehab PT Goals Patient Stated Goal: to be more independent PT Goal Formulation: With patient Time For Goal Achievement: 10/29/16 Potential to Achieve Goals: Fair Progress towards PT goals: Progressing toward goals    Frequency    Min 3X/week      PT Plan Current plan remains appropriate    Co-evaluation PT/OT/SLP Co-Evaluation/Treatment: Yes Reason for Co-Treatment: Complexity of the patient's impairments (multi-system involvement) PT goals addressed during session: Mobility/safety with mobility OT goals addressed during session: Strengthening/ROM     End of Session Equipment Utilized During Treatment: Oxygen Activity Tolerance: Patient limited by fatigue Patient left: in chair;with call bell/phone within reach     Time: [id number] PT Time  Calculation (min) (ACUTE ONLY): 25 min  Charges:  $Gait Training: 8-22 mins                    G Codes:      Irish Breisch, Tessie Fass 10/17/2016, 5:28 PM  10/17/2016  Donnella Sham, PT [phone]  (pager)

## 2016-10-17 NOTE — Progress Notes (Signed)
Occupational Therapy Treatment Patient Details Name: Kathleen Roy MRN: [id number] DOB: 1961/09/27 Today's Date: 10/17/2016    History of present illness Pt admitted from home after d/c from SNF 2 days prior with hypoxia and AMS requiring bipap. PMH: morbid obesity, chronic LE cellulitis, foot ulcer, depression, fibromyalgia, headaches, neuropathy, HTN, CHF, COPD.    OT comments  Pt readily willing to work with therapies and grateful to walk and be OOB. Increasing tolerance for activity. Continues to appropriate for SNF.  Follow Up Recommendations  SNF;Supervision/Assistance - 24 hour    Equipment Recommendations       Recommendations for Other Services      Precautions / Restrictions Precautions Precautions: Fall       Mobility Bed Mobility Overal bed mobility: Needs Assistance Bed Mobility: Supine to Sit           General bed mobility comments: HOB up, increased time, assist for LEs to EOB, and use of rail  Transfers Overall transfer level: Needs assistance Equipment used: Rolling walker (2 wheeled) Transfers: Sit to/from Stand Sit to Stand: Min assist;+2 safety/equipment         General transfer comment: did not need elevated surface today, assist to rise and steady    Balance     Sitting balance-Leahy Scale: Fair       Standing balance-Leahy Scale: Poor                     ADL       Grooming: Set up;Sitting;Wash/dry hands;Wash/dry face                               Functional mobility during ADLs: Minimal assistance;Rolling walker;+2 for safety/equipment General ADL Comments: Pt reminded of breathing technique with ambulation.      Vision                     Perception     Praxis      Cognition   Behavior During Therapy: WFL for tasks assessed/performed Overall Cognitive Status: Within Functional Limits for tasks assessed                       Extremity/Trunk Assessment                Exercises     Shoulder Instructions       General Comments      Pertinent Vitals/ Pain       Pain Assessment: Faces Faces Pain Scale: Hurts little more Pain Location: LEs Pain Descriptors / Indicators: Grimacing Pain Intervention(s): Repositioned;Premedicated before session  Home Living                                          Prior Functioning/Environment              Frequency  Min 2X/week        Progress Toward Goals  OT Goals(current goals can now be found in the care plan section)  Progress towards OT goals: Progressing toward goals  Acute Rehab OT Goals Patient Stated Goal: to be more independent Time For Goal Achievement: 10/29/16 Potential to Achieve Goals: Good  Plan Discharge plan remains appropriate    Co-evaluation    PT/OT/SLP Co-Evaluation/Treatment: Yes Reason for Co-Treatment: For patient/therapist safety   OT  goals addressed during session: Strengthening/ROM      End of Session Equipment Utilized During Treatment: Oxygen;Rolling walker   Activity Tolerance Patient tolerated treatment well   Patient Left in chair;with call bell/phone within reach   Nurse Communication          Time: [id number] OT Time Calculation (min): 25 min  Charges: OT General Charges $OT Visit: 1 Procedure OT Treatments $Therapeutic Activity: 8-22 mins  Malka So 10/17/2016, 4:49 PM [phone]

## 2016-10-17 NOTE — Progress Notes (Signed)
Family Medicine Teaching Service Daily Progress Note Intern Pager: [phone]  [name] Medical record number: [id number] Date of birth: June 01, 1961 Age: 55 y.o. Gender: female  Primary Care Provider: Evette Doffing, MD Consultants: None Code Status: DNR  Pt Overview and Major Events to Date:  Admit 10/30 to SDU  Assessment and Plan: Kathleen Roy is a 55 y.o. female presenting with difficulty walking at home. She desaturated in the ED and was admitted for observation. PMH is significant for morbid obesity, chronic respiratory failure 2/2 COPD/OHS/OSA (on 4L O2 at home), HFpEF, chronic venous stasis/chronic lymphedema, prediabetes, HTN, peripheral neuropathy, HLD, morbid obesity.  Acute on Chronic Hypoxemic Hypercarbic Respiratory Failure: Improving.  Patient on home 4L O2 by [place].  Transferred to med-surg floor 10/31.  Refused to wear C-pap overnight but satting well on 4L O2 at 96%.  Repeat EKG on 11/1 due to appearing diaphoretic and short of breath.  EKG unchanged from prior with RBBB and left anterior fasicular block, troponins negative <0.03.   - Low threshold for intubation if respiratory status were to worsen  - Transition from Lasix IV 40mg  bid to po Torsemide 40 mg BID today - Duonebs q6hrs and Albuterol q4hrs prn   - Continue Spiriva (started 10/30)  - incentive spirometry Q4 while awake  - Consider bariatric assessment - Will contact [facility] to explore possible options for her.  - PT- recommended SNF   --> will need to discuss with SW if SNF placement is an option  - Palliative --> DNR  Altered Mental Status: Resolved. Likely secondary to acute on chronic respiratory failure.  May also be related to the fact that Pt normally sleeps from 4AM to 2PM and she took her sleep medications around 2AM.  UDS negative.  TSH nl 3.08.  - Continue to monitor  Difficulty with ambulation: Has been a chronic problem for her secondary to her body habitus - PT+OT  recommending SNF   - Case management consult for assistance with [facility] needs  -placed Foley 10/30 because unable to ambulate to the bathroom  HFpEF: Last ECHO 08/20/16 with EF 60-65%, G2DD. On Torsemide 40mg  bid at home. Unclear if Pt is volume overloaded given body habitus.  - Continue Lasix IV 40mg  bid  -Consider Metolazone if not diuresing enough  - Daily weights, strict I/O - wt on admission 390.  - I/O : 486/2450  Net output -9.2 L since admit  Chronic Venous Stasis/Chronic Lymphedema: Recently treated for bilateral lower extremity cellulitis.  - Unna boots replaced 10/31  Prediabetes: Last A1c 6.3% on 06/04/16. On Glipizide 5mg  daily at home. - daily morning CBG this AM 81.  Start SSI for CBG > 140   HTN: Well-controlled. BP 117/74.   - Continue po Torsemide 40 BID as above  Peripheral Neuropathy: - Continue Lyrica 150 mg bid  HLD: Last lipid panel 01/16/16: Chol 119, TG 159, HDL 45, LDL 42 - Continue Pravastatin 40 mg qhs  Depression: - Continue home Effexor and Abilify   Chronic pain: - Continue home Lyrica and Flexeril  - Resume home Oxycodone but at lower dose for now (Oxy 5 mg q6hrs prn)   Mild anemia/Thrombocytopenia.  Stable.  Hgb on admission 9.7.  Platelets 133.  -daily CBC -monitor for bleeding  Difficulty sleeping.  At home on Trazodone 300 mg -Resume Trazodone 300 mg daily at bedtime   FEN/GI: Heart healthy carb-modified diet Prophylaxis: Lovenox  Disposition: Likely home today.  Dispo pending SW for  possible SNF placement  Subjective:  Patient alert.  Refused her c-pap overnight and states she did not have much sleep with it on. Felt like she slept better on 4L via [place].   States she has bilateral knee pain and is usually on 10 Oxy at home.  Has been getting 5 mg here.  States Trazodone helped her sleep last night.   Objective: Temp:  [98 F (36.7 C)-98.5 F (36.9 C)] 98 F (36.7 C) (11/02 1003) Pulse Rate:  [66-80] 80 (11/02 1003) Resp:   [17-18] 17 (11/02 1003) BP: (102-137)/(61-67) 102/67 (11/02 1003) SpO2:  [96 %-99 %] 99 % (11/02 1003) Weight:  [391 lb 1.5 oz (177.4 kg)] 391 lb 1.5 oz (177.4 kg) (11/02 1003) Physical Exam: General: obese female laying in hospital bed in NAD Eyes: PERRLA, EOMI ENTM: MMM, poor dental hygiene, o/p clear Neck: Supple, no cervical LAD Cardiovascular: RRR, no MRG Respiratory: normal work of breathing, CTA B/L with no wheezing noted Gastrointestinal: +BS, soft, NTND, obese MSK: Significant lymphedema in LE bilaterally, chronic venous stasis, unna boots in place Derm:  lower extremities are erythematous but not warm Neuro: AAOx3, no focal deficits,  CN 2-12 grossly intact Psych: Normal behavior, mood appropriate  Laboratory:  Recent Labs Lab 10/15/16 0329 10/16/16 0610 10/17/16 0602  WBC 3.7* 4.9 3.7*  HGB 9.7* 10.0* 10.0*  HCT 33.7* 33.8* 33.9*  PLT 133* 131* 133*    Recent Labs Lab 10/14/16 1942 10/15/16 0329 10/16/16 0610 10/17/16 0602  NA  --  146* 143 142  K  --  3.6 3.2* 3.4*  CL  --  98* 93* 92*  CO2  --  39* 38* 41*  BUN  --  10 9 9   CREATININE  --  0.84 0.88 0.80  CALCIUM  --  9.0 9.2 9.0  PROT 6.4*  --   --   --   BILITOT 0.4  --   --   --   ALKPHOS 77  --   --   --   ALT 15  --   --   --   AST 20  --   --   --   GLUCOSE  --  74 85 86   Imaging/Diagnostic Tests: No results found. Kathleen Kim, MD 10/17/2016, 12:19 PM PGY-1, [place] Intern pager: [phone], text pages welcome

## 2016-10-17 NOTE — Progress Notes (Signed)
Transitions of Care Pharmacy Note  Plan:  -Educated on discharge medications with focus on torsemide, Spiriva, albuterol, and atorvastatin  -Addressed concerns regarding Spiriva  -Recommend no changes to current medications  --------------------------------------------- Kathleen Roy is an 55 y.o. female who presents with a chief complaint of difficulty walking and respiratory distress. In anticipation of discharge, pharmacy has reviewed this patient's prior to admission medication history, as well as current inpatient medications listed per the [facility].  Current medication indications, dosing, frequency, and notable side effects reviewed with patient. patient verbalized understanding of current inpatient medication regimen and is aware that the After Visit Summary when presented, will represent the most accurate medication list at discharge.   OLUFUNKE BLICKENSTAFF expressed concerns regarding hoarseness and deep voice while on Spiriva. Patient was counseled that rinsing mouth following using medication may help.  Assessment: Understanding of regimen: fair Understanding of indications: fair Potential of compliance: good Barriers to Obtaining Medications: No  Patient instructed to contact inpatient pharmacy team with further questions or concerns if needed.    Time spent preparing for discharge counseling: 20 Time spent counseling patient: 49   Thank you for allowing pharmacy to be a part of this patient's care.  Myer Peer Grayland Ormond), PharmD  PGY1 Pharmacy Resident Pager: [phone] 10/17/2016 3:43 PM

## 2016-10-17 NOTE — Progress Notes (Signed)
Patient refuses CPAP for the night  

## 2016-10-18 ENCOUNTER — Inpatient Hospital Stay (HOSPITAL_COMMUNITY): Payer: Medicare Other

## 2016-10-18 DIAGNOSIS — R05 Cough: Secondary | ICD-10-CM

## 2016-10-18 DIAGNOSIS — R059 Cough, unspecified: Secondary | ICD-10-CM

## 2016-10-18 LAB — BASIC METABOLIC PANEL
Anion gap: 8 (ref 5–15)
BUN: 13 mg/dL (ref 6–20)
CHLORIDE: 91 mmol/L — AB (ref 101–111)
CO2: 42 mmol/L — ABNORMAL HIGH (ref 22–32)
CREATININE: 0.89 mg/dL (ref 0.44–1.00)
Calcium: 8.9 mg/dL (ref 8.9–10.3)
Glucose, Bld: 96 mg/dL (ref 65–99)
POTASSIUM: 3.3 mmol/L — AB (ref 3.5–5.1)
SODIUM: 141 mmol/L (ref 135–145)

## 2016-10-18 LAB — CBC
HEMATOCRIT: 35.2 % — AB (ref 36.0–46.0)
Hemoglobin: 10.3 g/dL — ABNORMAL LOW (ref 12.0–15.0)
MCH: 28.1 pg (ref 26.0–34.0)
MCHC: 29.3 g/dL — ABNORMAL LOW (ref 30.0–36.0)
MCV: 95.9 fL (ref 78.0–100.0)
PLATELETS: 144 10*3/uL — AB (ref 150–400)
RBC: 3.67 MIL/uL — AB (ref 3.87–5.11)
RDW: 16.3 % — AB (ref 11.5–15.5)
WBC: 4.4 10*3/uL (ref 4.0–10.5)

## 2016-10-18 LAB — GLUCOSE, CAPILLARY: Glucose-Capillary: 71 mg/dL (ref 65–99)

## 2016-10-18 MED ORDER — ENOXAPARIN SODIUM 100 MG/ML ~~LOC~~ SOLN
90.0000 mg | SUBCUTANEOUS | Status: DC
  Administered 2016-10-18 – 2016-10-21 (×4): 90 mg via SUBCUTANEOUS
  Filled 2016-10-18 (×4): qty 1

## 2016-10-18 MED ORDER — PRO-STAT SUGAR FREE PO LIQD
30.0000 mL | Freq: Two times a day (BID) | ORAL | Status: DC
  Administered 2016-10-19: 30 mL via ORAL
  Filled 2016-10-18 (×5): qty 30

## 2016-10-18 MED ORDER — DM-GUAIFENESIN ER 30-600 MG PO TB12
1.0000 | ORAL_TABLET | Freq: Two times a day (BID) | ORAL | Status: DC
  Administered 2016-10-18 – 2016-10-21 (×7): 1 via ORAL
  Filled 2016-10-18 (×7): qty 1

## 2016-10-18 MED ORDER — OXYCODONE HCL 5 MG PO TABS
10.0000 mg | ORAL_TABLET | Freq: Four times a day (QID) | ORAL | Status: DC | PRN
  Administered 2016-10-18 – 2016-10-21 (×7): 10 mg via ORAL
  Filled 2016-10-18 (×7): qty 2

## 2016-10-18 NOTE — Progress Notes (Signed)
Initial Nutrition Assessment  DOCUMENTATION CODES:   Morbid obesity  INTERVENTION:  Provide 30 ml Prostat po BID, each supplement provides 100 kcal and 15 grams of protein. .   RD to continue to monitor for needs as appropriate.  NUTRITION DIAGNOSIS:   Increased nutrient needs related to chronic illness as evidenced by estimated needs.  GOAL:   Patient will meet greater than or equal to 90% of their needs  MONITOR:   PO intake, Labs, Weight trends, Skin, I & O's  REASON FOR ASSESSMENT:   Low [place]    ASSESSMENT:   55 y.o. female  with past medical history of CHFpEF, COPD, morbid obesity, chronic knee and back pain, depression, OSA, chronic lymphedema, venous stasis, HTN, HLD admitted on 10/14/2016 with hypoxia.  RD attempted to visit patient multiple times, however pt was unavailable. RD unable to obtain nutrition history. Meal completion has been 100%. No weight loss observed per weight records.  Unable to complete Nutrition-Focused physical exam at this time.  RD to order Prostat to aid in adequate protein needs.   Labs and medications reviewed.   Diet Order:  Diet heart healthy/carb modified Room service appropriate? Yes; Fluid consistency: Thin  Skin:   (Cellulitis bilateral leg)  Last BM:  11/1  Height:   Ht Readings from Last 1 Encounters:  10/15/16 5\' 2"  (1.575 m)    Weight:   Wt Readings from Last 1 Encounters:  10/17/16 (!) 412 lb (186.9 kg)    Ideal Body Weight:  50 kg  BMI:  Body mass index is 75.36 kg/m.  Estimated Nutritional Needs:   Kcal:  2000-2200  Protein:  100-120 grams  Fluid:  2-2.2 L/day  EDUCATION NEEDS:   No education needs identified at this time  Corrin Parker, MS, RD, LDN Pager # [phone] After hours/ weekend pager # [phone]

## 2016-10-18 NOTE — Clinical Social Work Note (Signed)
CSW talked with admissions director, Santiago Glad at [facility] regarding patient and they are willing to accept her for rehab and skilled nursing care. CSW informed that they can take patient on Monday as they have to order a bariatric bed today and it will be delivered on Monday. Doctor contacted and advised.  Kathleen Roy, [name], [name] Licensed Clinical Social Worker [place] [phone]

## 2016-10-18 NOTE — Clinical Social Work Placement (Signed)
   CLINICAL SOCIAL WORK PLACEMENT  NOTE  Date:  10/18/2016  Patient Details  Name: Kathleen Roy MRN: [id number] Date of Birth: Oct 05, 1961  Clinical Social Work is seeking post-discharge placement for this patient at the [place] level of care (*CSW will initial, date and re-position this form in  chart as items are completed):  No (CSW and patient discussed the 3 facilities that may accept her)   Patient/family provided with [place] Work Department's list of facilities offering this level of care within the geographic area requested by the patient (or if unable, by the patient's family).  Yes   Patient/family informed of their freedom to choose among providers that offer the needed level of care, that participate in Medicare, Medicaid or managed care program needed by the patient, have an available bed and are willing to accept the patient.  No   Patient/family informed of [facility]'s ownership interest in [facility] and [facility], as well as of the fact that they are under no obligation to receive care at these facilities.  PASRR submitted to EDS on       PASRR number received on       Existing PASRR number confirmed on 10/18/16     FL2 transmitted to all facilities in geographic area requested by pt/family on 10/18/16     FL2 transmitted to all facilities within larger geographic area on       Patient informed that his/her managed care company has contracts with or will negotiate with certain facilities, including the following:        Yes   Patient/family informed of bed offers received.  Patient chooses bed at [facility]     Physician recommends and patient chooses bed at      Patient to be transferred to [facility] on  .  Patient to be transferred to facility by       Patient family notified on   of transfer.  Name of family member notified:        PHYSICIAN       Additional Comment:     _______________________________________________ Sable Feil, [name] 10/18/2016, 7:00 PM

## 2016-10-18 NOTE — Progress Notes (Signed)
Family Medicine Teaching Service Daily Progress Note Intern Pager: [phone]  [name] Medical record number: [id number] Date of birth: 01-15-1961 Age: 55 y.o. Gender: female  Primary Care Provider: Evette Doffing, MD Consultants: None Code Status: DNR  Pt Overview and Major Events to Date:  Admit 10/30 to SDU  Assessment and Plan: Kathleen Roy is a 55 y.o. female presenting with difficulty walking at home. She desaturated in the ED and was admitted for observation. PMH is significant for morbid obesity, chronic respiratory failure 2/2 COPD/OHS/OSA (on 4L O2 at home), HFpEF, chronic venous stasis/chronic lymphedema, prediabetes, HTN, peripheral neuropathy, HLD, morbid obesity.  Worsening cough.  Has been present for few days but has been more bothersome and non-productive.  Congestion heard on exam although lungs this AM sound clear .   -Order repeat CXR -Mucinex to help clear congestion  Acute on Chronic Hypoxemic Hypercarbic Respiratory Failure: Improving.  Patient on home 4L O2 by [place].  Refused to wear C-pap overnight again but satting well on 3.5L O2 at 96% this AM.  Repeat EKG on 11/1 due to appearing diaphoretic and short of breath.  EKG unchanged from prior with RBBB and left anterior fasicular block, troponins negative <0.03.   - Continue po Torsemide 40 mg BID today.  Diuresing well.  - Duonebs q6hrs and Albuterol q4hrs prn   - Continue Spiriva (started 10/30)  - incentive spirometry Q4 while awake  - Consider bariatric assessment - Will contact [facility] to explore possible options for her.  - PT- recommended SNF   --> will need to discuss with SW if SNF placement is an option  - Palliative --> DNR  Altered Mental Status: Resolved. Likely secondary to acute on chronic respiratory failure.  May also be related to the fact that Pt normally sleeps from 4AM to 2PM and she took her sleep medications around 2AM.  UDS negative.  TSH nl 3.08.  - Continue to  monitor  Difficulty with ambulation: Has been a chronic problem for her secondary to her body habitus - PT+OT recommending SNF   - Case management consult for assistance with [facility] needs  -placed Foley 10/30 because unable to ambulate to the bathroom  HFpEF: Last ECHO 08/20/16 with EF 60-65%, G2DD. On Torsemide 40mg  bid at home. Unclear if Pt is volume overloaded given body habitus.  - Continue Lasix IV 40mg  bid  -Consider Metolazone if not diuresing enough  - Daily weights, strict I/O - wt on admission 390.  - I/O : 486/2450  Net output -14.0 L since admit  Chronic Venous Stasis/Chronic Lymphedema: Recently treated for bilateral lower extremity cellulitis.  - Unna boots replaced 10/31  Prediabetes: Last A1c 6.3% on 06/04/16. On Glipizide 5mg  daily at home. - daily morning CBG this AM 81.  Start SSI for CBG > 140   HTN: Well-controlled. BP 117/74.   - Continue po Torsemide 40 BID as above  Peripheral Neuropathy: - Continue Lyrica 150 mg bid  HLD: Last lipid panel 01/16/16: Chol 119, TG 159, HDL 45, LDL 42 - Continue Pravastatin 40 mg qhs  Depression: - Continue home Effexor and Abilify   Chronic pain: - Continue home Lyrica and Flexeril  - Resume home Oxycodone but at lower dose for now. Increase Oxy to 10 mg Q6 prn.   Mild anemia/Thrombocytopenia.  Stable.  Hgb on admission 9.7.  Platelets 133.  -daily CBC -monitor for bleeding  Difficulty sleeping.  At home on Trazodone 300 mg -Resume Trazodone  300 mg daily at bedtime   FEN/GI: Heart healthy carb-modified diet Prophylaxis: Lovenox  Disposition: Likely home Monday.  Discharge to SNF.   Subjective:  Patient alert.  Refused her c-pap again overnight.  Feels congested this morning.  On 3.5 L O2 via [place].  Complaining of bilateral knee pain and states the lidocaine patch and 5 mg Oxy has not been helping.    Objective: Temp:  [97.9 F (36.6 C)-98.5 F (36.9 C)] 98.1 F (36.7 C) (11/03 0900) Pulse Rate:  [72-80] 74  (11/03 0900) Resp:  [20-22] 20 (11/03 0900) BP: (112-118)/(48-103) 112/48 (11/03 0900) SpO2:  [91 %-100 %] 96 % (11/03 1553) Weight:  [412 lb (186.9 kg)] 412 lb (186.9 kg) (11/02 2016) Physical Exam: General: obese female sitting in hospital chair in NAD Eyes: PERRLA, EOMI ENTM: MMM, poor dental hygiene, o/p clear Neck: Supple, no cervical LAD Cardiovascular: RRR, no MRG Respiratory: normal work of breathing, CTA B/L Gastrointestinal: +BS, soft, NTND, obese MSK: Significant lymphedema in LE bilaterally, chronic venous stasis, unna boots in place Derm:  lower extremities are erythematous but not warm Neuro: AAOx3, no focal deficits,  CN 2-12 grossly intact Psych: Normal behavior, mood appropriate  Laboratory:  Recent Labs Lab 10/16/16 0610 10/17/16 0602 10/18/16 0329  WBC 4.9 3.7* 4.4  HGB 10.0* 10.0* 10.3*  HCT 33.8* 33.9* 35.2*  PLT 131* 133* 144*    Recent Labs Lab 10/14/16 1942  10/16/16 0610 10/17/16 0602 10/18/16 0329  NA  --   < > 143 142 141  K  --   < > 3.2* 3.4* 3.3*  CL  --   < > 93* 92* 91*  CO2  --   < > 38* 41* 42*  BUN  --   < > 9 9 13   CREATININE  --   < > 0.88 0.80 0.89  CALCIUM  --   < > 9.2 9.0 8.9  PROT 6.4*  --   --   --   --   BILITOT 0.4  --   --   --   --   ALKPHOS 77  --   --   --   --   ALT 15  --   --   --   --   AST 20  --   --   --   --   GLUCOSE  --   < > 85 86 96  < > = values in this interval not displayed. Imaging/Diagnostic Tests: Dg Chest 2 View  Result Date: 10/18/2016 CLINICAL DATA:  Worsening cough over the past 5 days. History of COPD and obesity. Former smoker. EXAM: CHEST  2 VIEW COMPARISON:  Portable chest x-ray of October 14, 2016 FINDINGS: The patient is rotated on today's study. The lungs are reasonably well inflated. The interstitial markings remain increased bilaterally. The cardiac silhouette remains enlarged. The pulmonary vascularity is mildly prominent centrally but the degree of cephalization has decreased. There  is no significant pleural effusion. The observed bony thorax exhibits no acute abnormality. IMPRESSION: Limited study due to patient rotation. Stable cardiomegaly and mild interstitial prominence. No discrete pneumonia nor definite pulmonary edema. Electronically Signed   By: David  Martinique M.D.   On: 10/18/2016 13:14   Lovenia Kim, MD 10/18/2016, 5:18 PM PGY-1, [place] Intern pager: [phone], text pages welcome

## 2016-10-18 NOTE — Clinical Social Work Note (Signed)
Clinical Social Work Assessment  Patient Details  Name: Kathleen Roy MRN: [id number] Date of Birth: May 10, 1961  Date of referral:  10/16/16               Reason for consult:  Facility Placement                Permission sought to share information with:  Family Supports Permission granted to share information::  Yes, Verbal Permission Granted  Name::     Malaika Mellem  Agency::     Relationship::  Brother  Contact Information:  [phone]  Housing/Transportation Living arrangements for the past 2 months:  [place] of Information:  Patient Patient Interpreter Needed:  None Criminal Activity/Legal Involvement Pertinent to Current Situation/Hospitalization:  No - Comment as needed Significant Relationships:  Siblings Lives with:  Siblings Priseis Despain) Do you feel safe going back to the place where you live?  No (Patient in agreement that rehab needed before returning home for strenghtening) Need for family participation in patient care:  Yes (Comment)  Care giving concerns:  Patient reported that she needs rehab and skilled facility care before returning home, but is concerned about the co-pays due to her fixed income.   Social Worker assessment / plan:  CSW talked with patient regarding her discharge disposition and her insurance. Patient is aware that she is in her co-pay days with [facility] and this was discussed along with the ramifications this will have at any other facility she is discharged to (patient very recently left [facility] because her insurance stopped paying). Patient expressed understanding that she will be billed by the facility for the co-pays.   Employment status:  Disabled (Comment on whether or not currently receiving Disability) Insurance information:  Programmer, applications [facility]) PT Recommendations:  [place] / Referral to community resources:  [place] ([place] talked with patient regarding facilites that were  willing to consider her for placement)  Patient/Family's Response to care:  Patient expressed no concerns regarding the care she has received during hospitalization.  Patient/Family's Understanding of and Emotional Response to Diagnosis, Current Treatment, and Prognosis:  Not discussed.  Emotional Assessment Appearance:  Appears stated age Attitude/Demeanor/Rapport:  Other (Appropriate) Affect (typically observed):  Accepting, Appropriate, Pleasant Orientation:  Oriented to Self, Oriented to Place, Oriented to  Time, Oriented to Situation Alcohol / Substance use:  Tobacco Use, Alcohol Use, Illicit Drugs (Patient reported that she has quit smoking and does not drink or use illicit drugs) Psych involvement (Current and /or in the community):  No (Comment)  Discharge Needs  Concerns to be addressed:  Discharge Planning Concerns (patient is morbidly obese) Readmission within the last 30 days:  Yes Current discharge risk:  Inadequate Financial Supports (Patient in her co-pay days with [facility] Medicare) Barriers to Discharge:  Equipment Delay [facility] has to order a bariatric bed & will be available Monday, 11/6 )   Sable Feil, [name] 10/18/2016, 6:53 PM

## 2016-10-18 NOTE — NC FL2 (Signed)
[place] LEVEL OF CARE SCREENING TOOL     IDENTIFICATION  [name] Birthdate: 10-May-1961 Sex: female Admission Date (Current Location): 10/14/2016  [facility] and [place] Number:  Herbalist and Address:  [facility], [place] [address] [place], [place] [address]      Provider Number: [id number]  Attending Physician Name and Address:  Dickie La, MD  Relative Name and Phone Number:  [place],Don Brother - [phone] (h);  [phone] (mobile)     Current Level of Care: Hospital Recommended Level of Care: [place] Prior Approval Number:    Date Approved/Denied:   PASRR Number: [id number] (Eff. 08/20/16)  Discharge Plan: SNF    Current Diagnoses: Patient Active Problem List   Diagnosis Date Noted  . Insomnia   . Palliative care by specialist   . Advance care planning   . Chronic pain of both knees   . Acute respiratory failure with hypoxia and hypercarbia ([place]) 10/15/2016  . General weakness   . Hypoxia 10/14/2016  . Bacteremia   . Cellulitis of right lower extremity 06/04/2016  . Type 2 diabetes mellitus ([place]) 02/14/2016  . Pressure ulcer 01/16/2016  . Acute on chronic respiratory failure with hypercapnia ([place])   . Chronic diastolic congestive heart failure ([place])   . Essential hypertension   . CAP (community acquired pneumonia) 01/15/2016  . Bilateral knee pain 03/22/2015  . OSA (obstructive sleep apnea)   . Obesity hypoventilation syndrome ([place])   . Chronic respiratory failure with hypoxia ([place])   . Assistance needed for grooming 11/08/2014  . Fatigue 11/03/2014  . Memory loss 03/18/2014  . Tremor 03/18/2014  . Vulvovaginal candidiasis 11/25/2013  . Restrictive airway disease 08/26/2013  . Hypokalemia 08/19/2013  . Health care maintenance 06/17/2013  . Venous stasis dermatitis of both lower extremities 12/02/2012  . COPD (chronic obstructive pulmonary disease) ([place]) 10/15/2011  .  Peripheral neuropathy ([place]) 10/15/2011  . KNEE PAIN, BILATERAL 01/22/2011  . BACK PAIN, LUMBAR, CHRONIC 01/22/2011  . MIGRAINE HEADACHE 10/25/2010  . HYPERLIPIDEMIA 04/07/2009  . EDEMA, CHRONIC 06/27/2008  . Morbid obesity ([place]) 11/16/2007  . DEPRESSION, MAJOR, RECURRENT 02/12/2007  . PANIC ATTACKS 02/12/2007  . HYPERTENSION, BENIGN SYSTEMIC 02/12/2007    Orientation RESPIRATION BLADDER Height & Weight     Self, Time, Situation, Place  O2 (4 Liters O2. Per ED note, patient states she is suppose to sleep with a CPAP machine but doesn't have the money to purchase the equipment. Has been refusing during hospitalization. ) Continent, Indwelling catheter Weight: (!) 412 lb (186.9 kg) Height:  5\' 2"  (157.5 cm)  BEHAVIORAL SYMPTOMS/MOOD NEUROLOGICAL BOWEL NUTRITION STATUS      Continent Diet  AMBULATORY STATUS COMMUNICATION OF NEEDS Skin   Limited Assist (Patient has IT trainer) Verbally Normal                       Personal Care Assistance Level of Assistance  Bathing, Feeding, Dressing Bathing Assistance: Maximum assistance (Upper body mod assist/lower body total assist) Feeding assistance: Independent Dressing Assistance: Maximum assistance (Upper body min assist and lower body total assist)     Functional Limitations Info  Sight, Hearing, Speech Sight Info: Adequate Hearing Info: Adequate Speech Info: Adequate    SPECIAL CARE FACTORS FREQUENCY  PT (By licensed PT), OT (By licensed OT)     PT Frequency: Evaluated 10/31 and a minimum of 3X per week therapy recommended OT Frequency: Evaluated 10/31 and  a minimum of 2X per week therapy recoommended            Contractures Contractures Info: Not present    Additional Factors Info  Code Status, Allergies Code Status Info: DNR Allergies Info: Aspirin           Current Medications (10/18/2016):  This is the current hospital active medication list Current Facility-Administered Medications  Medication Dose Route  Frequency Provider Last Rate Last Dose  . 0.9 %  sodium chloride infusion  250 mL Intravenous PRN Sela Hua, MD      . acetaminophen (TYLENOL) tablet 650 mg  650 mg Oral Q6H PRN Sela Hua, MD   650 mg at 10/16/16 1113   Or  . acetaminophen (TYLENOL) suppository 650 mg  650 mg Rectal Q6H PRN Sela Hua, MD      . albuterol (PROVENTIL) (2.5 MG/3ML) 0.083% nebulizer solution 2.5 mg  2.5 mg Nebulization Q4H PRN Dickie La, MD      . ARIPiprazole (ABILIFY) tablet 2 mg  2 mg Oral QODAY Sela Hua, MD   2 mg at 10/16/16 0948  . cyclobenzaprine (FLEXERIL) tablet 5 mg  5 mg Oral TID PRN Donita Brooks, NP   5 mg at 10/17/16 2224  . enoxaparin (LOVENOX) injection 90 mg  90 mg Subcutaneous Q24H Lauren D Bajbus, RPH      . hydrocerin (EUCERIN) cream 1 application  1 application Topical BID Sela Hua, MD   1 application at [phone]  . ipratropium-albuterol (DUONEB) 0.5-2.5 (3) MG/3ML nebulizer solution 3 mL  3 mL Nebulization TID Dickie La, MD   3 mL at 10/18/16 0922  . lidocaine (LIDODERM) 5 % 2 patch  2 patch Transdermal Q24H Earlie Counts, NP   2 patch at 10/17/16 1228  . loratadine (CLARITIN) tablet 10 mg  10 mg Oral Daily Sela Hua, MD   10 mg at 10/17/16 0901  . nystatin (MYCOSTATIN/NYSTOP) topical powder 1 g  1 g Topical BID PRN Sela Hua, MD      . ondansetron [facility]) tablet 4 mg  4 mg Oral Q6H PRN Sela Hua, MD       Or  . ondansetron [facility]) injection 4 mg  4 mg Intravenous Q6H PRN Sela Hua, MD      . oxyCODONE (Oxy IR/ROXICODONE) immediate release tablet 5 mg  5 mg Oral Q6H PRN Sela Hua, MD   5 mg at 10/17/16 2224  . polyethylene glycol (MIRALAX / GLYCOLAX) packet 17 g  17 g Oral Daily PRN Sela Hua, MD      . potassium chloride SA (K-DUR,KLOR-CON) CR tablet 40 mEq  40 mEq Oral Daily Grayling Congress McMullen, DO      . pravastatin (PRAVACHOL) tablet 40 mg  40 mg Oral QHS Sela Hua, MD   40 mg at 10/17/16 2224  . pregabalin (LYRICA)  capsule 150 mg  150 mg Oral BID Sela Hua, MD   150 mg at 10/17/16 2224  . sodium chloride flush (NS) 0.9 % injection 3 mL  3 mL Intravenous Q12H Sela Hua, MD   3 mL at 10/17/16 0902  . sodium chloride flush (NS) 0.9 % injection 3 mL  3 mL Intravenous Q12H Sela Hua, MD   3 mL at 10/17/16 0902  . sodium chloride flush (NS) 0.9 % injection 3 mL  3 mL Intravenous PRN Sela Hua, MD   3  mL at 10/16/16 1003  . tiotropium (SPIRIVA) inhalation capsule 18 mcg  18 mcg Inhalation Daily Sela Hua, MD   18 mcg at 10/18/16 [phone]  . torsemide (DEMADEX) tablet 40 mg  40 mg Oral BID Beaver Bing, DO   40 mg at 10/17/16 1837  . traZODone (DESYREL) tablet 300 mg  300 mg Oral QHS Sela Hua, MD   300 mg at 10/17/16 2224  . venlafaxine XR (EFFEXOR-XR) 24 hr capsule 300 mg  300 mg Oral Daily Sela Hua, MD   300 mg at 10/17/16 0901     Discharge Medications: Please see discharge summary for a list of discharge medications.  Relevant Imaging Results:  Relevant Lab Results:   Additional Information [id number].   Sable Feil, [name]

## 2016-10-18 NOTE — Care Management Important Message (Signed)
Important Message  Patient Details  Name: Kathleen Roy MRN: [id number] Date of Birth: [birth year]   Medicare Important Message Given:  Yes    Greg Cratty 10/18/2016, 11:39 AM

## 2016-10-19 LAB — BASIC METABOLIC PANEL WITH GFR
Anion gap: 10 (ref 5–15)
BUN: 13 mg/dL (ref 6–20)
CO2: 37 mmol/L — ABNORMAL HIGH (ref 22–32)
Calcium: 8.7 mg/dL — ABNORMAL LOW (ref 8.9–10.3)
Chloride: 93 mmol/L — ABNORMAL LOW (ref 101–111)
Creatinine, Ser: 0.87 mg/dL (ref 0.44–1.00)
GFR calc Af Amer: >60 mL/min
GFR calc non Af Amer: >60 mL/min
Glucose, Bld: 86 mg/dL (ref 65–99)
Potassium: 3.5 mmol/L (ref 3.5–5.1)
Sodium: 140 mmol/L (ref 135–145)

## 2016-10-19 LAB — GLUCOSE, CAPILLARY: Glucose-Capillary: 85 mg/dL (ref 65–99)

## 2016-10-19 MED ORDER — GUAIFENESIN 100 MG/5ML PO SOLN
5.0000 mL | Freq: Four times a day (QID) | ORAL | Status: DC | PRN
  Administered 2016-10-19: 5 mg via ORAL
  Administered 2016-10-20: 100 mg via ORAL
  Filled 2016-10-19 (×3): qty 5

## 2016-10-19 MED ORDER — MENTHOL 3 MG MT LOZG: 1.0000 | LOZENGE | OROMUCOSAL | Status: DC | PRN

## 2016-10-19 NOTE — Progress Notes (Signed)
Family Medicine Teaching Service Daily Progress Note Intern Pager: [phone]  [name] Medical record number: [id number] Date of birth: 1961/10/06 Age: 55 y.o. Gender: female  Primary Care Provider: Evette Doffing, MD Consultants: PCCM, Palliative Code Status: DNR  Pt Overview and Major Events to Date:  Admit 10/30 to SDU  Assessment and Plan: Kathleen Roy a 55 y.o.femalepresenting with difficulty walking at home. She desaturated in the ED and was admitted for observation.PMH is significant for morbid obesity, chronic respiratory failure 2/2 COPD/OHS/OSA (on 4L O2 at home), HFpEF, chronic venous stasis/chronic lymphedema, prediabetes, HTN, peripheral neuropathy, HLD, morbid obesity.  Acute on Chronic Hypoxemic HypercarbicRespiratory Failure: Improving.  Patient on home 4L O2 by [place].  Satting well on 4L O2 at 95% this AM.  Repeat CXR on 11/3 due to worsening nonproductive cough though lungs sounded clear demonstrated no pneumonia.   - Duonebs q6hrs and Albuterol q4hrs prn   - Continue Spiriva (started 10/30)  - incentive spirometry Q4 while awake  - Consider bariatric assessment - Will contact [name] to explore possible options for her.  - PT- recommended SNF, patient awaiting a bed - Palliative on board, patient is DNR - continue guaifenesin, dextromethorphan, Cepacol cough drops for nonproductive cough  Altered Mental Status: Resolved. Likely secondary to acute on chronic respiratory failure.  May also be related to the fact that Pt normally sleeps from 4AM to 2PM and she took her sleep medications around 2AM.  UDS negative.  TSH nl 3.08.  - Continue to monitor  Difficulty with ambulation: Has been a chronic problem for her secondary to her body habitus - PT+OT recommending SNF   - Case management consult for assistance with [facility] needs  -placed Foley 10/30 because unable to ambulate to the bathroom  HFpEF: Last ECHO 08/20/16 with EF 60-65%, G2DD. On  Torsemide 40mg  bid at home. Unclear if Pt is volume overloaded given body habitus.  - Continue PO Torsemide 40 mg BID, diuresing well  - Consider Metolazone if not diuresing enough  - Daily weights, strict I/O - wt on admission 390.  - I/O : 570/2451  Net output -15.2 L since admit   Chronic Venous Stasis/Chronic Lymphedema: Recently treated for bilateral lower extremity cellulitis.  - Unna boots replaced 10/31  Impaired glucose tolerance: Last A1c 6.3% on 06/04/16. On Glipizide 5mg  daily at home. - daily morning CBG this AM 85.  Start SSI for CBG > 140   HTN: Well-controlled. BP 120/62.   - Continue po Torsemide 40 BID as above  Peripheral Neuropathy: - Continue Lyrica 150 mg bid  HLD: Last lipid panel 01/16/16: Chol 119, TG 159, HDL 45, LDL 42 - Continue Pravastatin 40 mg qhs  Depression: - Continue home Effexor and Abilify   Chronic pain: - Continue home Lyrica and Flexeril  - Continue home Oxy 10 mg Q6 prn.   Mild anemia/Thrombocytopenia.  Stable.  Hgb on admission 9.7, stable at 10.3.  Platelets 133 on admission, continue to be stable at 144 today.  -daily CBC -monitor for bleeding  Difficulty sleeping.  At home on Trazodone 300 mg -ContinueTrazodone 300 mg daily at bedtime   FEN/GI: Heart healthy carb-modified diet Prophylaxis: Lovenox  Disposition: SNF pending bed availability  Subjective:  Patient alert.  Refused her c-pap again overnight.  Complains of continued cough this morning.  On 4L O2 via [place].    Objective: Temp:  [98.1 F (36.7 C)-98.6 F (37 C)] 98.5 F (36.9 C) (11/04 0502)  Pulse Rate:  [74-81] 81 (11/04 0502) Resp:  [19-20] 19 (11/04 0502) BP: (112-120)/(48-62) 120/62 (11/04 0502) SpO2:  [91 %-98 %] 95 % (11/04 0737) Weight:  [187.3 kg (412 lb 14.8 oz)] 187.3 kg (412 lb 14.8 oz) (11/04 0500) Physical Exam: General: obese female in NAD, appears stated age Cardiovascular: RRR, no m/r/g Respiratory: clear to auscultation on the left, +soft  wheezes in upper and lower lung fields on the right with normal work of breathing Abdomen: obese, soft and nontender, nondistended Extremities: LE lymphedema bilaterally, chronic venous stasis, unna boots in place  Laboratory:  Recent Labs Lab 10/16/16 0610 10/17/16 0602 10/18/16 0329  WBC 4.9 3.7* 4.4  HGB 10.0* 10.0* 10.3*  HCT 33.8* 33.9* 35.2*  PLT 131* 133* 144*    Recent Labs Lab 10/14/16 1942  10/16/16 0610 10/17/16 0602 10/18/16 0329  NA  --   < > 143 142 141  K  --   < > 3.2* 3.4* 3.3*  CL  --   < > 93* 92* 91*  CO2  --   < > 38* 41* 42*  BUN  --   < > 9 9 13   CREATININE  --   < > 0.88 0.80 0.89  CALCIUM  --   < > 9.2 9.0 8.9  PROT 6.4*  --   --   --   --   BILITOT 0.4  --   --   --   --   ALKPHOS 77  --   --   --   --   ALT 15  --   --   --   --   AST 20  --   --   --   --   GLUCOSE  --   < > 85 86 96  < > = values in this interval not displayed.    Imaging/Diagnostic Tests: Dg Chest 2 View  Result Date: 10/18/2016 CLINICAL DATA:  Worsening cough over the past 5 days. History of COPD and obesity. Former smoker. EXAM: CHEST  2 VIEW COMPARISON:  Portable chest x-ray of October 14, 2016 FINDINGS: The patient is rotated on today's study. The lungs are reasonably well inflated. The interstitial markings remain increased bilaterally. The cardiac silhouette remains enlarged. The pulmonary vascularity is mildly prominent centrally but the degree of cephalization has decreased. There is no significant pleural effusion. The observed bony thorax exhibits no acute abnormality. IMPRESSION: Limited study due to patient rotation. Stable cardiomegaly and mild interstitial prominence. No discrete pneumonia nor definite pulmonary edema. Electronically Signed   By: David  Martinique M.D.   On: 10/18/2016 13:14    Everrett Coombe, MD 10/19/2016, 8:47 AM PGY-1, [place] Intern pager: [phone], text pages welcome

## 2016-10-19 NOTE — Progress Notes (Signed)
Patient continues to refuse CPAP therapy. 

## 2016-10-20 LAB — BASIC METABOLIC PANEL
Anion gap: 12 (ref 5–15)
BUN: 20 mg/dL (ref 6–20)
CHLORIDE: 94 mmol/L — AB (ref 101–111)
CO2: 34 mmol/L — AB (ref 22–32)
CREATININE: 0.9 mg/dL (ref 0.44–1.00)
Calcium: 8.7 mg/dL — ABNORMAL LOW (ref 8.9–10.3)
GFR calc Af Amer: >60 mL/min (ref >60)
GFR calc non Af Amer: >60 mL/min (ref >60)
GLUCOSE: 88 mg/dL (ref 65–99)
Potassium: 4.2 mmol/L (ref 3.5–5.1)
Sodium: 140 mmol/L (ref 135–145)

## 2016-10-20 LAB — GLUCOSE, CAPILLARY: Glucose-Capillary: 93 mg/dL (ref 65–99)

## 2016-10-20 NOTE — Discharge Summary (Signed)
[facility] Discharge Summary  [name] Medical record number: [id number] Date of birth: November 22, 1961 Age: 55 y.o. Gender: female Date of Admission: 10/14/2016  Date of Discharge: 10/21/2016 Admitting Physician: Dickie La, MD  Primary Care Provider: Evette Doffing, MD Consultants:  Palliative  Indication for Hospitalization: difficulty walking, shortness of breath  Discharge Diagnoses/Problem List:  Active Problems:   Hypoxia   Acute respiratory failure with hypoxia and hypercarbia (HCC)   General weakness   Insomnia   Palliative care by specialist   Advance care planning   Chronic pain of both knees   Cough  Disposition: SNF  Discharge Condition: Stable, improved  Discharge Exam:  General: obese female in NAD, appears stated age Cardiovascular: RRR, no m/r/g Respiratory: clear to auscultation , with normal work of breathing Abdomen: obese, soft and nontender, nondistended Extremities: LE lymphedema bilaterally, chronic venous stasis, unna boots in place Psych: mood appropriate  Brief Hospital Course:  Patient brought in by EMS due to difficulty walking at home.  She desaturated in the ED and was admitted for observation.  PMH is significant for morbid obesity, chronic respiratory failure 2/2 COPD/OHS/OSA (on 4L O2 at home), HFpEF, chronic venous stasis/chronic lymphedema, prediabetes, HTN, peripheral neuropathy, HLD, morbid obesity.    Noted to be very sleepy initially when admitted and was monitored closely for changes in mental status. Patient with uncontrolled OSA, does not use CPAP at home.   Altered mental status on admission likely to be secondary to acute on chronic respiratory failure. Stated she had picked up a cold from previous facility and it had worsened with cough.   ACS was ruled out with EKG unchanged from her prior and negative troponins.  Her mental status improved throughout the course of the day. Was given duonebs  and started on Spiriva as she was not on any controller medication.  Also of note, it was unclear if patient was volume overloaded on exam, as her legs are baseline large and she has chronic venous stasis/lymphedema.  Has a history of HFpEF with her last Echo in 9/17 showing EF 60-65%, G2DD.  She was started on lasix and Metolazone and monitored for fluid status. Received breathing treatments as needed for her cough/congestion.  CXR did not show any concern for pneumonia.   PT and OT were consulted for recommendations and both agreed she would benefit from a SNF.   Her Unna boots were replaced prior to discharge.  She was continued on home medications for the remainder of her chronic problems and at time of discharge was stable.    Issues for Follow Up:  1. Would greatly benefit from weight loss.  While she may not be a candidate for bariatric surgery, there may be programs and resources available to her for continued support.   2. At SNF will need a diabetic (carb-modified) diet order.   3. Pulmonary rehab and walking/ambulation.   4. BMET to check Creatinine function.  5. Torsemide dose changed.  Take 40 mg by mouth in the AM and 20 mg by mouth in the PM.  Follow up volume status and adjust dosing as needed.   Significant Procedures: None  Significant Labs and Imaging:   Recent Labs Lab 10/17/16 0602 10/18/16 0329 10/21/16 0549  WBC 3.7* 4.4 6.2  HGB 10.0* 10.3* 10.6*  HCT 33.9* 35.2* 35.8*  PLT 133* 144* 175    Recent Labs Lab 10/14/16 1942  10/16/16 0610 10/17/16 0602 10/18/16 0329 10/19/16 0827  10/20/16 0557 10/21/16 0549  NA  --   < > 143 142 141 140 140  --   K  --   < > 3.2* 3.4* 3.3* 3.5 4.2  --   CL  --   < > 93* 92* 91* 93* 94*  --   CO2  --   < > 38* 41* 42* 37* 34*  --   GLUCOSE  --   < > 85 86 96 86 88  --   BUN  --   < > 9 9 13 13 20   --   CREATININE  --   < > 0.88 0.80 0.89 0.87 0.90 1.09*  CALCIUM  --   < > 9.2 9.0 8.9 8.7* 8.7*  --   ALKPHOS 77  --   --   --    --   --   --   --   AST 20  --   --   --   --   --   --   --   ALT 15  --   --   --   --   --   --   --   ALBUMIN 3.3*  --   --   --   --   --   --   --   < > = values in this interval not displayed.  Dg Chest 2 View  Result Date: 10/18/2016 CLINICAL DATA:  Worsening cough over the past 5 days. History of COPD and obesity. Former smoker. EXAM: CHEST  2 VIEW COMPARISON:  Portable chest x-ray of October 14, 2016 FINDINGS: The patient is rotated on today's study. The lungs are reasonably well inflated. The interstitial markings remain increased bilaterally. The cardiac silhouette remains enlarged. The pulmonary vascularity is mildly prominent centrally but the degree of cephalization has decreased. There is no significant pleural effusion. The observed bony thorax exhibits no acute abnormality. IMPRESSION: Limited study due to patient rotation. Stable cardiomegaly and mild interstitial prominence. No discrete pneumonia nor definite pulmonary edema. Electronically Signed   By: David  Martinique M.D.   On: 10/18/2016 13:14   Results/Tests Pending at Time of Discharge: None  Discharge Medications:    Medication List    STOP taking these medications   pravastatin 40 MG tablet Commonly known as:  PRAVACHOL   Vancomycin 750-5 MG/150ML-% Soln Commonly known as:  VANCOCIN     TAKE these medications   albuterol 108 (90 Base) MCG/ACT inhaler Commonly known as:  PROVENTIL HFA;VENTOLIN HFA Inhale 2 puffs into the lungs every 4 (four) hours as needed for wheezing or shortness of breath.   ARIPiprazole 2 MG tablet Commonly known as:  ABILIFY take 1 tablet by mouth every other day What changed:  See the new instructions.   atorvastatin 10 MG tablet Commonly known as:  LIPITOR Take 10 mg by mouth at bedtime.   cetirizine 10 MG tablet Commonly known as:  ZYRTEC Take 10 mg by mouth daily.   cyclobenzaprine 10 MG tablet Commonly known as:  FLEXERIL Take 1 tablet (10 mg total) by mouth 3 (three)  times daily as needed for muscle spasms.   Fish Oil 1000 MG Caps Take 2 capsules (2,000 mg total) by mouth at bedtime.   glipiZIDE 5 MG tablet Commonly known as:  GLUCOTROL take 1 tablet by mouth once daily What changed:  See the new instructions.   hydrocerin Crea Apply 1 application topically 2 (two) times daily.   NONFORMULARY OR COMPOUNDED ITEM  Mineral Oil/Eucerin cream: Apply to affected area twice a day   nystatin powder Commonly known as:  MYCOSTATIN/NYSTOP Apply topically 2 (two) times daily. Apply to affected skin BID What changed:  when to take this  reasons to take this  additional instructions   Oxycodone HCl 10 MG Tabs Take 1 tablet (10 mg total) by mouth every 6 (six) hours as needed for severe pain. What changed:  medication strength  how much to take   oxyCODONE-acetaminophen 10-325 MG tablet Commonly known as:  PERCOCET Take 1 tablet by mouth every 8 (eight) hours.   OXYGEN Inhale into the lungs. 4l/min   polyethylene glycol packet Commonly known as:  MIRALAX / GLYCOLAX Take 17 g by mouth at bedtime.   potassium chloride SA 20 MEQ tablet Commonly known as:  K-DUR,KLOR-CON Take 2 tablets (40 mEq total) by mouth daily.   pregabalin 150 MG capsule Commonly known as:  LYRICA Take 1 capsule (150 mg total) by mouth 2 (two) times daily.   tiotropium 18 MCG inhalation capsule Commonly known as:  SPIRIVA Place 1 capsule (18 mcg total) into inhaler and inhale daily.   torsemide 20 MG tablet Commonly known as:  DEMADEX Take 40 mg by mouth in the AM and 20 mg by mouth in the PM. What changed:  See the new instructions.   traZODone 150 MG tablet Commonly known as:  DESYREL Take 2 tablets (300 mg total) by mouth at bedtime.   venlafaxine XR 150 MG 24 hr capsule Commonly known as:  EFFEXOR-XR Take 2 capsules (300 mg total) by mouth daily.   vitamin C 1000 MG tablet Take 3,000 mg by mouth at bedtime.       Discharge Instructions: Please  refer to Patient Instructions section of EMR for full details.  Patient was counseled important signs and symptoms that should prompt return to medical care, changes in medications, dietary instructions, activity restrictions, and follow up appointments.   Follow-Up Appointments: Follow-up Information    Evette Doffing, MD Follow up on 10/25/2016.   Specialty:  Family Medicine Why:  Hospital follow up appointment with Dr. Brett Albino at 3:45 PM.  Please make sure to show up 15 min prior to your appointment.  Contact information: [address] [place] [address] [phone]          Lovenia Kim, MD 10/21/2016, 4:03 PM PGY-1, [place]

## 2016-10-20 NOTE — Progress Notes (Signed)
Family Medicine Teaching Service Daily Progress Note Intern Pager: [phone]  [name] Medical record number: [id number] Date of birth: 1961-08-31 Age: 55 y.o. Gender: female  Primary Care Provider: Evette Doffing, MD Consultants: PCCM, Palliative Code Status: DNR  Pt Overview and Major Events to Date:  Admit 10/30 to SDU  Assessment and Plan: Kathleen Roy a 55 y.o.femalepresenting with difficulty walking at home. She desaturated in the ED and was admitted for observation.PMH is significant for morbid obesity, chronic respiratory failure 2/2 COPD/OHS/OSA (on 4L O2 at home), HFpEF, chronic venous stasis/chronic lymphedema, prediabetes, HTN, peripheral neuropathy, HLD, morbid obesity.  Acute on Chronic Hypoxemic HypercarbicRespiratory Failure: Improving.  Patient on home 4L O2 by [place].  Satting well on 4L O2 at 95% this AM.  Repeat CXR on 11/3 due to worsening nonproductive cough though lungs sounded clear demonstrated no pneumonia.   - Duonebs q6hrs and Albuterol q4hrs prn   - Continue Spiriva (started 10/30)  - incentive spirometry Q4 while awake  - Consider bariatric assessment - Will contact [facility] to explore possible options for her.  - PT- recommended SNF, bed available for Monday AM - Palliative on board, patient is DNR - continue guaifenesin, dextromethorphan, Cepacol cough drops for nonproductive cough  Altered Mental Status: Resolved. Likely secondary to acute on chronic respiratory failure.  May also be related to the fact that Pt normally sleeps from 4AM to 2PM and she took her sleep medications around 2AM.  UDS negative.  TSH nl 3.08.  - Continue to monitor  Difficulty with ambulation: Has been a chronic problem for her secondary to her body habitus - PT+OT recommending SNF   - Case management consult for assistance with [facility] needs  -placed Foley 10/30 because unable to ambulate to the bathroom  HFpEF: Last ECHO 08/20/16 with EF 60-65%,  G2DD. On Torsemide 40mg  bid at home. Unclear if Pt is volume overloaded given body habitus.  - Continue PO Torsemide 40 mg BID, diuresing well  - Consider Metolazone if not diuresing enough  - Daily weights, strict I/O - wt on admission 390.  - I/O : 570/2451  Net output -17.3 L since admit   Chronic Venous Stasis/Chronic Lymphedema: Recently treated for bilateral lower extremity cellulitis.  - Unna boots replaced 10/31  Impaired glucose tolerance: Last A1c 6.3% on 06/04/16. On Glipizide 5mg  daily at home. - daily morning CBG this AM 85.  Start SSI for CBG > 140   HTN: Well-controlled. BP 120/62.   - Continue po Torsemide 40 BID as above  Peripheral Neuropathy: - Continue Lyrica 150 mg bid  HLD: Last lipid panel 01/16/16: Chol 119, TG 159, HDL 45, LDL 42 - Continue Pravastatin 40 mg qhs  Depression: - Continue home Effexor and Abilify   Chronic pain: - Continue home Lyrica and Flexeril  - Continue home Oxy 10 mg Q6 prn.   Mild anemia/Thrombocytopenia.  Stable.  Hgb on admission 9.7, stable at 10.3.  Platelets 133 on admission, continue to be stable at 144 today.  -daily CBC -monitor for bleeding  Difficulty sleeping.  At home on Trazodone 300 mg -ContinueTrazodone 300 mg daily at bedtime   FEN/GI: Heart healthy carb-modified diet Prophylaxis: Lovenox  Disposition: Discharge to SNF [facility]) on Monday AM.  Subjective:  Patient alert. Continues to refuse c-pap therapy.  Getting breathing treatment this AM.  Complains of continued cough this morning.  On 9L O2 but satting well.  Patient states she does not know  why her O2 was up so high.  Decreased her O2 to 6 L.   Objective: Temp:  [98.4 F (36.9 C)-98.6 F (37 C)] 98.6 F (37 C) (11/05 0549) Pulse Rate:  [76-83] 81 (11/05 0549) Resp:  [18-20] 19 (11/05 0549) BP: (97-112)/(44-70) 98/51 (11/05 0549) SpO2:  [94 %-97 %] 95 % (11/05 0549) Weight:  [388 lb (176 kg)] 388 lb (176 kg) (11/05  0549) Physical Exam: General: obese female in NAD, appears stated age Cardiovascular: RRR, no m/r/g Respiratory: clear to auscultation on the left, with normal work of breathing Abdomen: obese, soft and nontender, nondistended Extremities: LE lymphedema bilaterally, chronic venous stasis, unna boots in place Psych: mood appropriate  Laboratory:  Recent Labs Lab 10/16/16 0610 10/17/16 0602 10/18/16 0329  WBC 4.9 3.7* 4.4  HGB 10.0* 10.0* 10.3*  HCT 33.8* 33.9* 35.2*  PLT 131* 133* 144*    Recent Labs Lab 10/14/16 1942  10/18/16 0329 10/19/16 0827 10/20/16 0557  NA  --   < > 141 140 140  K  --   < > 3.3* 3.5 4.2  CL  --   < > 91* 93* 94*  CO2  --   < > 42* 37* 34*  BUN  --   < > 13 13 20   CREATININE  --   < > 0.89 0.87 0.90  CALCIUM  --   < > 8.9 8.7* 8.7*  PROT 6.4*  --   --   --   --   BILITOT 0.4  --   --   --   --   ALKPHOS 77  --   --   --   --   ALT 15  --   --   --   --   AST 20  --   --   --   --   GLUCOSE  --   < > 96 86 88  < > = values in this interval not displayed.  Imaging/Diagnostic Tests: No results found.  Lovenia Kim, MD 10/20/2016, 9:05 AM PGY-1, [place] Intern pager: [phone], text pages welcome

## 2016-10-21 DIAGNOSIS — J9621 Acute and chronic respiratory failure with hypoxia: Secondary | ICD-10-CM | POA: Diagnosis not present

## 2016-10-21 DIAGNOSIS — R5383 Other fatigue: Secondary | ICD-10-CM | POA: Diagnosis not present

## 2016-10-21 DIAGNOSIS — H81399 Other peripheral vertigo, unspecified ear: Secondary | ICD-10-CM | POA: Diagnosis not present

## 2016-10-21 DIAGNOSIS — R0602 Shortness of breath: Secondary | ICD-10-CM | POA: Diagnosis not present

## 2016-10-21 DIAGNOSIS — M6281 Muscle weakness (generalized): Secondary | ICD-10-CM | POA: Diagnosis not present

## 2016-10-21 DIAGNOSIS — J9611 Chronic respiratory failure with hypoxia: Secondary | ICD-10-CM | POA: Diagnosis not present

## 2016-10-21 DIAGNOSIS — E1140 Type 2 diabetes mellitus with diabetic neuropathy, unspecified: Secondary | ICD-10-CM | POA: Diagnosis not present

## 2016-10-21 DIAGNOSIS — M25569 Pain in unspecified knee: Secondary | ICD-10-CM | POA: Diagnosis not present

## 2016-10-21 DIAGNOSIS — G4733 Obstructive sleep apnea (adult) (pediatric): Secondary | ICD-10-CM | POA: Diagnosis not present

## 2016-10-21 DIAGNOSIS — R609 Edema, unspecified: Secondary | ICD-10-CM | POA: Diagnosis not present

## 2016-10-21 DIAGNOSIS — J989 Respiratory disorder, unspecified: Secondary | ICD-10-CM | POA: Diagnosis not present

## 2016-10-21 DIAGNOSIS — E785 Hyperlipidemia, unspecified: Secondary | ICD-10-CM | POA: Diagnosis not present

## 2016-10-21 DIAGNOSIS — I5032 Chronic diastolic (congestive) heart failure: Secondary | ICD-10-CM | POA: Diagnosis not present

## 2016-10-21 DIAGNOSIS — J449 Chronic obstructive pulmonary disease, unspecified: Secondary | ICD-10-CM | POA: Diagnosis not present

## 2016-10-21 DIAGNOSIS — L309 Dermatitis, unspecified: Secondary | ICD-10-CM | POA: Diagnosis not present

## 2016-10-21 DIAGNOSIS — J069 Acute upper respiratory infection, unspecified: Secondary | ICD-10-CM | POA: Diagnosis not present

## 2016-10-21 DIAGNOSIS — R531 Weakness: Secondary | ICD-10-CM | POA: Diagnosis not present

## 2016-10-21 DIAGNOSIS — E1165 Type 2 diabetes mellitus with hyperglycemia: Secondary | ICD-10-CM | POA: Diagnosis not present

## 2016-10-21 DIAGNOSIS — J9601 Acute respiratory failure with hypoxia: Secondary | ICD-10-CM | POA: Diagnosis not present

## 2016-10-21 DIAGNOSIS — J9602 Acute respiratory failure with hypercapnia: Secondary | ICD-10-CM | POA: Diagnosis not present

## 2016-10-21 DIAGNOSIS — G9009 Other idiopathic peripheral autonomic neuropathy: Secondary | ICD-10-CM | POA: Diagnosis not present

## 2016-10-21 DIAGNOSIS — J80 Acute respiratory distress syndrome: Secondary | ICD-10-CM | POA: Diagnosis not present

## 2016-10-21 DIAGNOSIS — J9600 Acute respiratory failure, unspecified whether with hypoxia or hypercapnia: Secondary | ICD-10-CM | POA: Diagnosis not present

## 2016-10-21 DIAGNOSIS — I872 Venous insufficiency (chronic) (peripheral): Secondary | ICD-10-CM | POA: Diagnosis not present

## 2016-10-21 DIAGNOSIS — L03115 Cellulitis of right lower limb: Secondary | ICD-10-CM | POA: Diagnosis not present

## 2016-10-21 DIAGNOSIS — G4700 Insomnia, unspecified: Secondary | ICD-10-CM | POA: Diagnosis not present

## 2016-10-21 DIAGNOSIS — R2689 Other abnormalities of gait and mobility: Secondary | ICD-10-CM | POA: Diagnosis not present

## 2016-10-21 LAB — CREATININE, SERUM
CREATININE: 1.09 mg/dL — AB (ref 0.44–1.00)
GFR, EST NON AFRICAN AMERICAN: 56 mL/min — AB (ref >60)

## 2016-10-21 LAB — CBC
HCT: 35.8 % — ABNORMAL LOW (ref 36.0–46.0)
Hemoglobin: 10.6 g/dL — ABNORMAL LOW (ref 12.0–15.0)
MCH: 28.8 pg (ref 26.0–34.0)
MCHC: 29.6 g/dL — AB (ref 30.0–36.0)
MCV: 97.3 fL (ref 78.0–100.0)
PLATELETS: 175 10*3/uL (ref 150–400)
RBC: 3.68 MIL/uL — ABNORMAL LOW (ref 3.87–5.11)
RDW: 16 % — AB (ref 11.5–15.5)
WBC: 6.2 10*3/uL (ref 4.0–10.5)

## 2016-10-21 LAB — GLUCOSE, CAPILLARY: GLUCOSE-CAPILLARY: 112 mg/dL — AB (ref 65–99)

## 2016-10-21 MED ORDER — TORSEMIDE 20 MG PO TABS: ORAL_TABLET | ORAL | 1 refills | Status: DC

## 2016-10-21 MED ORDER — OXYCODONE HCL 10 MG PO TABS: 10.0000 mg | ORAL_TABLET | Freq: Four times a day (QID) | ORAL | 0 refills | Status: DC | PRN

## 2016-10-21 NOTE — Progress Notes (Signed)
Patient was discharged transferred  Via EMS to a SNF facility. Patient stable and appears comfortable after pain med earlier given.

## 2016-10-21 NOTE — Progress Notes (Signed)
Family Medicine Teaching Service Daily Progress Note Intern Pager: [phone]  [name] Medical record number: [id number] Date of birth: 1961-11-29 Age: 55 y.o. Gender: female  Primary Care Provider: Evette Doffing, MD Consultants: PCCM, Palliative Code Status: DNR  Pt Overview and Major Events to Date:  Admit 10/30 to SDU  Assessment and Plan: Kathleen Roy a 55 y.o.femalepresenting with difficulty walking at home. She desaturated in the ED and was admitted for observation.PMH is significant for morbid obesity, chronic respiratory failure 2/2 COPD/OHS/OSA (on 4L O2 at home), HFpEF, chronic venous stasis/chronic lymphedema, prediabetes, HTN, peripheral neuropathy, HLD, morbid obesity.  Acute on Chronic Hypoxemic HypercarbicRespiratory Failure: Resolved.  Patient on home 4L O2 by [place].  Likely multifactorial given OSA, OHS, COPD.  - Duonebs q6hrs and Albuterol q4hrs prn   - Continue Spiriva (started 10/30)  - incentive spirometry Q4 while awake  - Consider bariatric assessment - Will contact [facility] to explore possible options for her.  - PT- recommended SNF, bed available for today. -On home O2 4L, satting well - Palliative on board, patient is DNR - continue guaifenesin, dextromethorphan, Cepacol cough drops for nonproductive cough  Altered Mental Status: Resolved. Likely secondary to acute on chronic respiratory failure.  May also be related to the fact that Pt normally sleeps from 4AM to 2PM and she took her sleep medications around 2AM.  UDS negative.  TSH nl 3.08.  - Continue to monitor  Difficulty with ambulation: Has been a chronic problem for her secondary to her body habitus.  - PT+OT recommending SNF   - Case management consult for assistance with [facility] needs  -placed Foley 10/30 because unable to ambulate to the bathroom -remove Foley  HFpEF: Last ECHO 08/20/16 with EF 60-65%, G2DD. On Torsemide 40mg  bid at home. Unclear if Pt is volume  overloaded given body habitus.  - Continue PO Torsemide 40 mg BID, diuresing well  - Consider Metolazone if not diuresing enough  - Daily weights, strict I/O - wt on admission 390.  - I/O : 840/1900.  Net output -18.4 L since admit   Chronic Venous Stasis/Chronic Lymphedema: Recently treated for bilateral lower extremity cellulitis.  - Unna boots replaced 10/31  Impaired glucose tolerance: Last A1c 6.3% on 06/04/16. On Glipizide 5mg  daily at home.  Sugars overnight in 80s to 90's. - daily morning CBG this AM 112.  Start SSI for CBG > 140   HTN: Well-controlled. BP 142/80.   - Continue po Torsemide 40 BID as above  Peripheral Neuropathy: - Continue Lyrica 150 mg bid  HLD: Last lipid panel 01/16/16: Chol 119, TG 159, HDL 45, LDL 42 - Continue Pravastatin 40 mg qhs  Depression: - Continue home Effexor and Abilify   Chronic pain: - Continue home Lyrica and Flexeril  - Continue home Oxy 10 mg Q6 prn.   Mild anemia/Thrombocytopenia.  Stable.  Hgb on admission 9.7, stable at 10.3.  Platelets 133 on admission, continue to be stable at 144 today.  -daily CBC -monitor for bleeding  Difficulty sleeping.  At home on Trazodone 300 mg -ContinueTrazodone 300 mg daily at bedtime   FEN/GI: Heart healthy carb-modified diet Prophylaxis: Lovenox  Disposition: Discharge to SNF [facility]) today.   Subjective:  Patient alert this AM. Has been refusing her CPAP overnight. On 4L O2 satting well.  Was informed that she will be going to [facility] today.  No concerns at this time.   Objective: Temp:  [98.3 F (36.8  C)-98.8 F (37.1 C)] 98.3 F (36.8 C) (11/06 0914) Pulse Rate:  [68-103] 77 (11/06 0914) Resp:  [18] 18 (11/06 0914) BP: (99-142)/(55-80) 142/80 (11/06 0914) SpO2:  [93 %-98 %] 98 % (11/06 0914) Weight:  [426 lb (193.2 kg)] 426 lb (193.2 kg) (11/06 0555) Physical Exam: General: obese female in NAD, laying in hospital bed just waking up from  sleep Cardiovascular: RRR, no m/r/g Respiratory: clear to auscultation bilaterally, with normal work of breathing Abdomen: obese, soft and nontender, nondistended Extremities: LE lymphedema bilaterally, chronic venous stasis, unna boots in place Psych: mood appropriate  Laboratory:  Recent Labs Lab 10/17/16 0602 10/18/16 0329 10/21/16 0549  WBC 3.7* 4.4 6.2  HGB 10.0* 10.3* 10.6*  HCT 33.9* 35.2* 35.8*  PLT 133* 144* 175    Recent Labs Lab 10/14/16 1942  10/18/16 0329 10/19/16 0827 10/20/16 0557 10/21/16 0549  NA  --   < > 141 140 140  --   K  --   < > 3.3* 3.5 4.2  --   CL  --   < > 91* 93* 94*  --   CO2  --   < > 42* 37* 34*  --   BUN  --   < > 13 13 20   --   CREATININE  --   < > 0.89 0.87 0.90 1.09*  CALCIUM  --   < > 8.9 8.7* 8.7*  --   PROT 6.4*  --   --   --   --   --   BILITOT 0.4  --   --   --   --   --   ALKPHOS 77  --   --   --   --   --   ALT 15  --   --   --   --   --   AST 20  --   --   --   --   --   GLUCOSE  --   < > 96 86 88  --   < > = values in this interval not displayed.  Imaging/Diagnostic Tests: No results found.  Kathleen Kim, MD 10/21/2016, 1:41 PM PGY-1, [place] Intern pager: [phone], text pages welcome

## 2016-10-21 NOTE — Progress Notes (Signed)
Patient has discharge orders to [facility] care, gave report to the nurse Seth Bake and answered all her questions.  Removed foley before discharge, tolerated pretty good.  No peripheral IV.  Currently waiting for the EMS to pick her up.  Will continue to monitor and will make aware of the situation to the oncoming nurse.

## 2016-10-21 NOTE — Progress Notes (Signed)
Orthopedic Tech Progress Note Patient Details:  Kathleen Roy 02/22/1961 [id number]  Ortho Devices Type of Ortho Device: Louretta Parma boot Ortho Device/Splint Location: bilateral Ortho Device/Splint Interventions: Application   [name] 10/21/2016, 9:59 AM

## 2016-10-21 NOTE — Consult Note (Addendum)
[place] Nurse wound consult note Reason for Consult: Pt was admitted with bilat Una boots which had been applied prior to admission and were changed on 10/31, according to the EMR. Wound type: Removed Una boots to assess legs; patchy areas of partial thickness breakdown, red and moist to right calf.  Minimal amt yellow drainage, no odor.  Generalized edema and erythremia  to bilat legs.  Dry loose peeling skin to BLE. No full thickness wounds requiring topical treatment at this time. Dressing procedure/placement/frequency: Ortho tech paged to re-apply bilat Una boots and coban to assist with controlling edema, and change Q Mon/Thursday.  Pt will need assistance after discharge to change Una boots twice a week. Please re-consult if further assistance is needed.  Thank-you,  Julien Girt MSN, [place], [place], [place], [place]

## 2016-10-21 NOTE — Progress Notes (Signed)
   10/21/16 1000  Clinical Encounter Type  Visited With Patient  Visit Type Follow-up  Referral From Palliative care team  Spiritual Encounters  Spiritual Needs Prayer;Emotional  Stress Factors  Patient Stress Factors Financial concerns;Health changes  Advance Directives (For Healthcare)  Does patient have an advance directive? Yes  Would patient like information on creating an advanced directive? Yes - Scientist, clinical (histocompatibility and immunogenetics) given  Type of Paramedic of [place];Living will  Does patient want to make changes to advanced directive? Yes - information given  Copy of advanced directive(s) in chart? Yes    Chaplain followed up on [facility] consult from last week [id number] team. Patient sedate but aware and had concerns about her tremor and the legality of her signature on AD reassured pt that tremor was medical condition and her AD would still stand. Prayed with pt and offered emotional support. AD form notarized and copy placed in file via Financial controller.

## 2016-10-21 NOTE — Clinical Social Work Placement (Signed)
   CLINICAL SOCIAL WORK PLACEMENT  NOTE 10/21/16 - DISCHARGED TO [facility] VIA AMBULANCE  Date:  10/21/2016  Patient Details  Name: Kathleen Roy MRN: [id number] Date of Birth: 02-Aug-1961  Clinical Social Work is seeking post-discharge placement for this patient at the [place] level of care (*CSW will initial, date and re-position this form in  chart as items are completed):  No (CSW and patient discussed the 3 facilities that may accept her)   Patient/family provided with [place] Work Department's list of facilities offering this level of care within the geographic area requested by the patient (or if unable, by the patient's family).  Yes   Patient/family informed of their freedom to choose among providers that offer the needed level of care, that participate in Medicare, Medicaid or managed care program needed by the patient, have an available bed and are willing to accept the patient.  No   Patient/family informed of 's ownership interest in [facility] and [facility], as well as of the fact that they are under no obligation to receive care at these facilities.  PASRR submitted to EDS on       PASRR number received on       Existing PASRR number confirmed on 10/18/16     FL2 transmitted to all facilities in geographic area requested by pt/family on 10/18/16     FL2 transmitted to all facilities within larger geographic area on       Patient informed that his/her managed care company has contracts with or will negotiate with certain facilities, including the following:        Yes   Patient/family informed of bed offers received.  Patient chooses bed at [facility]     Physician recommends and patient chooses bed at      Patient to be transferred to [facility] on  10/21/16.  Patient to be transferred to facility by  ambulance     Patient family notified on  10/21/16 of transfer.  Name of  family member notified:   Brother Kathleen Roy at the bedside.     PHYSICIAN       Additional Comment:    _______________________________________________ Sable Feil, [name] 10/21/2016, 6:12 PM

## 2016-10-24 DIAGNOSIS — J9621 Acute and chronic respiratory failure with hypoxia: Secondary | ICD-10-CM | POA: Diagnosis not present

## 2016-10-24 DIAGNOSIS — R531 Weakness: Secondary | ICD-10-CM | POA: Diagnosis not present

## 2016-10-24 DIAGNOSIS — I872 Venous insufficiency (chronic) (peripheral): Secondary | ICD-10-CM | POA: Diagnosis not present

## 2016-10-25 ENCOUNTER — Ambulatory Visit: Payer: Medicare Other | Admitting: Internal Medicine

## 2016-10-25 DIAGNOSIS — H81399 Other peripheral vertigo, unspecified ear: Secondary | ICD-10-CM | POA: Diagnosis not present

## 2016-10-25 DIAGNOSIS — J069 Acute upper respiratory infection, unspecified: Secondary | ICD-10-CM | POA: Diagnosis not present

## 2016-10-25 DIAGNOSIS — E1165 Type 2 diabetes mellitus with hyperglycemia: Secondary | ICD-10-CM | POA: Diagnosis not present

## 2016-10-25 DIAGNOSIS — R5383 Other fatigue: Secondary | ICD-10-CM | POA: Diagnosis not present

## 2016-10-25 DIAGNOSIS — L309 Dermatitis, unspecified: Secondary | ICD-10-CM | POA: Diagnosis not present

## 2016-10-29 DIAGNOSIS — R0602 Shortness of breath: Secondary | ICD-10-CM | POA: Diagnosis not present

## 2016-10-29 DIAGNOSIS — I5032 Chronic diastolic (congestive) heart failure: Secondary | ICD-10-CM | POA: Diagnosis not present

## 2016-10-29 DIAGNOSIS — R609 Edema, unspecified: Secondary | ICD-10-CM | POA: Diagnosis not present

## 2016-10-30 DIAGNOSIS — J9601 Acute respiratory failure with hypoxia: Secondary | ICD-10-CM | POA: Diagnosis not present

## 2016-10-30 DIAGNOSIS — I5032 Chronic diastolic (congestive) heart failure: Secondary | ICD-10-CM | POA: Diagnosis not present

## 2016-10-30 DIAGNOSIS — R0602 Shortness of breath: Secondary | ICD-10-CM | POA: Diagnosis not present

## 2016-10-30 DIAGNOSIS — J449 Chronic obstructive pulmonary disease, unspecified: Secondary | ICD-10-CM | POA: Diagnosis not present

## 2016-11-05 DIAGNOSIS — I5032 Chronic diastolic (congestive) heart failure: Secondary | ICD-10-CM | POA: Diagnosis not present

## 2016-11-05 DIAGNOSIS — E1140 Type 2 diabetes mellitus with diabetic neuropathy, unspecified: Secondary | ICD-10-CM | POA: Diagnosis not present

## 2016-11-05 DIAGNOSIS — E1165 Type 2 diabetes mellitus with hyperglycemia: Secondary | ICD-10-CM | POA: Diagnosis not present

## 2016-11-06 ENCOUNTER — Telehealth: Payer: Self-pay | Admitting: *Deleted

## 2016-11-06 DIAGNOSIS — R238 Other skin changes: Secondary | ICD-10-CM | POA: Diagnosis not present

## 2016-11-06 DIAGNOSIS — E1165 Type 2 diabetes mellitus with hyperglycemia: Secondary | ICD-10-CM | POA: Diagnosis not present

## 2016-11-06 DIAGNOSIS — G4733 Obstructive sleep apnea (adult) (pediatric): Secondary | ICD-10-CM | POA: Diagnosis not present

## 2016-11-06 DIAGNOSIS — J449 Chronic obstructive pulmonary disease, unspecified: Secondary | ICD-10-CM | POA: Diagnosis not present

## 2016-11-06 DIAGNOSIS — H81399 Other peripheral vertigo, unspecified ear: Secondary | ICD-10-CM | POA: Diagnosis not present

## 2016-11-06 DIAGNOSIS — E1142 Type 2 diabetes mellitus with diabetic polyneuropathy: Secondary | ICD-10-CM | POA: Diagnosis not present

## 2016-11-06 DIAGNOSIS — I890 Lymphedema, not elsewhere classified: Secondary | ICD-10-CM | POA: Diagnosis not present

## 2016-11-06 DIAGNOSIS — R531 Weakness: Secondary | ICD-10-CM | POA: Diagnosis not present

## 2016-11-06 DIAGNOSIS — I5032 Chronic diastolic (congestive) heart failure: Secondary | ICD-10-CM | POA: Diagnosis not present

## 2016-11-06 DIAGNOSIS — Z9981 Dependence on supplemental oxygen: Secondary | ICD-10-CM | POA: Diagnosis not present

## 2016-11-06 DIAGNOSIS — Z7984 Long term (current) use of oral hypoglycemic drugs: Secondary | ICD-10-CM | POA: Diagnosis not present

## 2016-11-06 NOTE — Telephone Encounter (Signed)
Patient was recently discharged from SNF and is being set up with [facility] again for home health.  [name] went out and assessed patient today and would like verbal ok to : 1. Have a standing order to place an unna boot for leg weeping if it has restarted when the nurse goes out on Friday to check patient.  Patient stated that she had one on before discharged but then it was removed.    2. OT orders for twice a week visits for 4 weeks for respiratory management and education.   3. Verbal order for social work to come out and see what services they can offer patient.   When these orders are processed, provider will received a 485 Form in her box that will require her to sign off on it.  Since provider is post call and we discussed this morning giving ok for orders that came in today.  Verbal ok given to [facility] and informed her that provider would contact her if there were any changes that she wanted.  Yovanny Coats,CMA

## 2016-11-12 ENCOUNTER — Emergency Department (HOSPITAL_COMMUNITY)
Admission: EM | Admit: 2016-11-12 | Discharge: 2016-11-13 | Disposition: A | Discharge status: Home / Self Care | Payer: Medicare Other | Attending: Emergency Medicine | Admitting: Emergency Medicine

## 2016-11-12 ENCOUNTER — Emergency Department (HOSPITAL_COMMUNITY): Payer: Medicare Other

## 2016-11-12 ENCOUNTER — Telehealth: Payer: Self-pay | Admitting: Internal Medicine

## 2016-11-12 DIAGNOSIS — M545 Low back pain: Secondary | ICD-10-CM | POA: Insufficient documentation

## 2016-11-12 DIAGNOSIS — E1140 Type 2 diabetes mellitus with diabetic neuropathy, unspecified: Secondary | ICD-10-CM | POA: Insufficient documentation

## 2016-11-12 DIAGNOSIS — J449 Chronic obstructive pulmonary disease, unspecified: Secondary | ICD-10-CM | POA: Insufficient documentation

## 2016-11-12 DIAGNOSIS — M25562 Pain in left knee: Secondary | ICD-10-CM | POA: Diagnosis not present

## 2016-11-12 DIAGNOSIS — Z7984 Long term (current) use of oral hypoglycemic drugs: Secondary | ICD-10-CM | POA: Insufficient documentation

## 2016-11-12 DIAGNOSIS — R531 Weakness: Secondary | ICD-10-CM | POA: Insufficient documentation

## 2016-11-12 DIAGNOSIS — I5032 Chronic diastolic (congestive) heart failure: Secondary | ICD-10-CM | POA: Diagnosis not present

## 2016-11-12 DIAGNOSIS — I110 Hypertensive heart disease with heart failure: Secondary | ICD-10-CM | POA: Insufficient documentation

## 2016-11-12 DIAGNOSIS — M25561 Pain in right knee: Secondary | ICD-10-CM | POA: Insufficient documentation

## 2016-11-12 DIAGNOSIS — M25552 Pain in left hip: Secondary | ICD-10-CM | POA: Insufficient documentation

## 2016-11-12 DIAGNOSIS — Z87891 Personal history of nicotine dependence: Secondary | ICD-10-CM | POA: Insufficient documentation

## 2016-11-12 DIAGNOSIS — M25551 Pain in right hip: Secondary | ICD-10-CM | POA: Insufficient documentation

## 2016-11-12 DIAGNOSIS — G8929 Other chronic pain: Secondary | ICD-10-CM | POA: Insufficient documentation

## 2016-11-12 DIAGNOSIS — Z7409 Other reduced mobility: Secondary | ICD-10-CM | POA: Diagnosis not present

## 2016-11-12 DIAGNOSIS — M25519 Pain in unspecified shoulder: Secondary | ICD-10-CM | POA: Diagnosis not present

## 2016-11-12 LAB — URINALYSIS, ROUTINE W REFLEX MICROSCOPIC
BILIRUBIN URINE: NEGATIVE
GLUCOSE, UA: NEGATIVE mg/dL
HGB URINE DIPSTICK: NEGATIVE
KETONES UR: NEGATIVE mg/dL
NITRITE: NEGATIVE
PH: 6 (ref 5.0–8.0)
Protein, ur: NEGATIVE mg/dL
SPECIFIC GRAVITY, URINE: 1.013 (ref 1.005–1.030)

## 2016-11-12 LAB — COMPREHENSIVE METABOLIC PANEL
ALBUMIN: 3.4 g/dL — AB (ref 3.5–5.0)
ALK PHOS: 68 U/L (ref 38–126)
ALT: 10 U/L — AB (ref 14–54)
ANION GAP: 10 (ref 5–15)
AST: 15 U/L (ref 15–41)
BILIRUBIN TOTAL: 0.8 mg/dL (ref 0.3–1.2)
BUN: 8 mg/dL (ref 6–20)
CALCIUM: 9.5 mg/dL (ref 8.9–10.3)
CO2: 36 mmol/L — AB (ref 22–32)
CREATININE: 0.87 mg/dL (ref 0.44–1.00)
Chloride: 99 mmol/L — ABNORMAL LOW (ref 101–111)
GFR calc non Af Amer: >60 mL/min (ref >60)
GLUCOSE: 81 mg/dL (ref 65–99)
Potassium: 3.6 mmol/L (ref 3.5–5.1)
SODIUM: 145 mmol/L (ref 135–145)
TOTAL PROTEIN: 6.9 g/dL (ref 6.5–8.1)

## 2016-11-12 LAB — CBC WITH DIFFERENTIAL/PLATELET
Basophils Absolute: 0 10*3/uL (ref 0.0–0.1)
Basophils Relative: 0 %
Eosinophils Absolute: 0 10*3/uL (ref 0.0–0.7)
Eosinophils Relative: 0 %
HEMATOCRIT: 35.4 % — AB (ref 36.0–46.0)
HEMOGLOBIN: 10.5 g/dL — AB (ref 12.0–15.0)
LYMPHS ABS: 1.1 10*3/uL (ref 0.7–4.0)
Lymphocytes Relative: 18 %
MCH: 28.4 pg (ref 26.0–34.0)
MCHC: 29.7 g/dL — AB (ref 30.0–36.0)
MCV: 95.7 fL (ref 78.0–100.0)
MONOS PCT: 7 %
Monocytes Absolute: 0.4 10*3/uL (ref 0.1–1.0)
NEUTROS ABS: 4.5 10*3/uL (ref 1.7–7.7)
NEUTROS PCT: 75 %
Platelets: 145 10*3/uL — ABNORMAL LOW (ref 150–400)
RBC: 3.7 MIL/uL — AB (ref 3.87–5.11)
RDW: 16.3 % — ABNORMAL HIGH (ref 11.5–15.5)
WBC: 6 10*3/uL (ref 4.0–10.5)

## 2016-11-12 LAB — URINE MICROSCOPIC-ADD ON

## 2016-11-12 LAB — I-STAT CG4 LACTIC ACID, ED: Lactic Acid, Venous: 0.92 mmol/L (ref 0.5–1.9)

## 2016-11-12 MED ORDER — ARIPIPRAZOLE 2 MG PO TABS
2.0000 mg | ORAL_TABLET | Freq: Every day | ORAL | Status: DC
  Administered 2016-11-12: 2 mg via ORAL
  Filled 2016-11-12: qty 1

## 2016-11-12 MED ORDER — VENLAFAXINE HCL ER 150 MG PO CP24
300.0000 mg | ORAL_CAPSULE | Freq: Every day | ORAL | Status: DC
  Administered 2016-11-12: 300 mg via ORAL
  Filled 2016-11-12: qty 2

## 2016-11-12 MED ORDER — ALBUTEROL SULFATE HFA 108 (90 BASE) MCG/ACT IN AERS
2.0000 | INHALATION_SPRAY | RESPIRATORY_TRACT | Status: DC | PRN
  Administered 2016-11-12: 2 via RESPIRATORY_TRACT
  Filled 2016-11-12: qty 6.7

## 2016-11-12 MED ORDER — TORSEMIDE 20 MG PO TABS
40.0000 mg | ORAL_TABLET | Freq: Two times a day (BID) | ORAL | Status: DC
  Filled 2016-11-12: qty 2

## 2016-11-12 MED ORDER — ALBUTEROL SULFATE (2.5 MG/3ML) 0.083% IN NEBU: 2.5000 mg | INHALATION_SOLUTION | RESPIRATORY_TRACT | Status: DC | PRN

## 2016-11-12 MED ORDER — FLUTICASONE FUROATE-VILANTEROL 100-25 MCG/INH IN AEPB
1.0000 | INHALATION_SPRAY | Freq: Every day | RESPIRATORY_TRACT | Status: DC
  Filled 2016-11-12: qty 28

## 2016-11-12 MED ORDER — GLIPIZIDE 5 MG PO TABS
5.0000 mg | ORAL_TABLET | Freq: Every day | ORAL | Status: DC
Start: 2016-11-13 — End: 2016-11-13
  Filled 2016-11-12: qty 1

## 2016-11-12 MED ORDER — OXYCODONE HCL 10 MG PO TABS: 10.0000 mg | ORAL_TABLET | Freq: Four times a day (QID) | ORAL | Status: DC | PRN

## 2016-11-12 MED ORDER — PREGABALIN 75 MG PO CAPS
150.0000 mg | ORAL_CAPSULE | Freq: Two times a day (BID) | ORAL | Status: DC
  Filled 2016-11-12: qty 2

## 2016-11-12 MED ORDER — TIOTROPIUM BROMIDE MONOHYDRATE 18 MCG IN CAPS
18.0000 ug | ORAL_CAPSULE | Freq: Every day | RESPIRATORY_TRACT | Status: DC
  Filled 2016-11-12: qty 5

## 2016-11-12 MED ORDER — LORATADINE 10 MG PO TABS
10.0000 mg | ORAL_TABLET | Freq: Every day | ORAL | Status: DC
  Administered 2016-11-12: 10 mg via ORAL
  Filled 2016-11-12: qty 1

## 2016-11-12 MED ORDER — TORSEMIDE 20 MG PO TABS
20.0000 mg | ORAL_TABLET | Freq: Every day | ORAL | Status: DC
  Administered 2016-11-12: 20 mg via ORAL
  Filled 2016-11-12 (×2): qty 1

## 2016-11-12 MED ORDER — POTASSIUM CHLORIDE CRYS ER 20 MEQ PO TBCR
40.0000 meq | EXTENDED_RELEASE_TABLET | Freq: Every day | ORAL | Status: DC
  Administered 2016-11-12: 40 meq via ORAL
  Filled 2016-11-12: qty 2

## 2016-11-12 MED ORDER — OXYCODONE HCL 10 MG PO TABS: 10.0000 mg | ORAL_TABLET | Freq: Four times a day (QID) | ORAL | 0 refills | Status: DC | PRN

## 2016-11-12 MED ORDER — OXYCODONE HCL 5 MG PO TABS
10.0000 mg | ORAL_TABLET | Freq: Four times a day (QID) | ORAL | Status: DC | PRN
  Administered 2016-11-12: 10 mg via ORAL
  Filled 2016-11-12: qty 2

## 2016-11-12 MED ORDER — OXYCODONE HCL 5 MG PO TABS
10.0000 mg | ORAL_TABLET | Freq: Once | ORAL | Status: AC
  Administered 2016-11-12: 10 mg via ORAL
  Filled 2016-11-12: qty 2

## 2016-11-12 MED ORDER — TRAZODONE HCL 100 MG PO TABS: 300.0000 mg | ORAL_TABLET | Freq: Every day | ORAL | Status: DC

## 2016-11-12 MED ORDER — CYCLOBENZAPRINE HCL 10 MG PO TABS: 10.0000 mg | ORAL_TABLET | Freq: Three times a day (TID) | ORAL | Status: DC | PRN

## 2016-11-12 MED ORDER — ATORVASTATIN CALCIUM 10 MG PO TABS: 10.0000 mg | ORAL_TABLET | Freq: Every day | ORAL | Status: DC

## 2016-11-12 NOTE — ED Notes (Signed)
Meal tray delivered to pt

## 2016-11-12 NOTE — ED Notes (Signed)
Per Dr. Brett Albino, family practice social worker and hospital social worker are working on disposition for pt.

## 2016-11-12 NOTE — Discharge Instructions (Signed)
Go to [place]

## 2016-11-12 NOTE — NC FL2 (Signed)
[place] LEVEL OF CARE SCREENING TOOL     IDENTIFICATION  [name] Birthdate: 18-Jun-1961 Sex: female Admission Date (Current Location): 11/12/2016  [facility] [place] Number:  Herbalist and Address:  [facility] [place], [place] [address] [place], [place] [address]      Provider Number: [id number]  Attending Physician Name and Address:  No att. providers found  Relative Name and Phone Number:  Janeane Jurica (brother) [phone]; [phone]    Current Level of Care: Hospital Recommended Level of Care: [place] Prior Approval Number:    Date Approved/Denied:   PASRR Number: [id number]  Discharge Plan: SNF    Current Diagnoses: Patient Active Problem List   Diagnosis Date Noted  . Cough   . Insomnia   . Palliative care by specialist   . Advance care planning   . Chronic pain of both knees   . Acute respiratory failure with hypoxia and hypercarbia ([place]) 10/15/2016  . General weakness   . Hypoxia 10/14/2016  . Bacteremia   . Cellulitis of right lower extremity 06/04/2016  . Type 2 diabetes mellitus ([place]) 02/14/2016  . Pressure ulcer 01/16/2016  . Acute on chronic respiratory failure with hypercapnia ([place])   . Chronic diastolic congestive heart failure ([place])   . Essential hypertension   . CAP (community acquired pneumonia) 01/15/2016  . Bilateral knee pain 03/22/2015  . OSA (obstructive sleep apnea)   . Obesity hypoventilation syndrome ([place])   . Chronic respiratory failure with hypoxia ([place])   . Assistance needed for grooming 11/08/2014  . Fatigue 11/03/2014  . Memory loss 03/18/2014  . Tremor 03/18/2014  . Vulvovaginal candidiasis 11/25/2013  . Restrictive airway disease 08/26/2013  . Hypokalemia 08/19/2013  . Health care maintenance 06/17/2013  . Venous stasis dermatitis of both lower extremities 12/02/2012  . COPD (chronic obstructive pulmonary disease) ([place]) 10/15/2011  . Peripheral  neuropathy ([place]) 10/15/2011  . KNEE PAIN, BILATERAL 01/22/2011  . BACK PAIN, LUMBAR, CHRONIC 01/22/2011  . MIGRAINE HEADACHE 10/25/2010  . HYPERLIPIDEMIA 04/07/2009  . EDEMA, CHRONIC 06/27/2008  . Morbid obesity ([place]) 11/16/2007  . DEPRESSION, MAJOR, RECURRENT 02/12/2007  . PANIC ATTACKS 02/12/2007  . HYPERTENSION, BENIGN SYSTEMIC 02/12/2007    Orientation RESPIRATION BLADDER Height & Weight     Self, Time, Situation, Place  O2 (3L Nasal Cannula; CPAP at night) Continent Weight:   Height:     BEHAVIORAL SYMPTOMS/MOOD NEUROLOGICAL BOWEL NUTRITION STATUS   (NONE)  (NONE) Continent Diet (Heart Healthy Diet)  AMBULATORY STATUS COMMUNICATION OF NEEDS Skin   Extensive Assist Verbally Normal                       Personal Care Assistance Level of Assistance  Bathing, Dressing, Feeding Bathing Assistance: Maximum assistance Feeding assistance: Independent Dressing Assistance: Maximum assistance     Functional Limitations Info  Hearing, Sight, Speech Sight Info: Adequate Hearing Info: Adequate Speech Info: Adequate    SPECIAL CARE FACTORS FREQUENCY  PT (By licensed PT)     PT Frequency: min 3x/week               Contractures Contractures Info: Not present    Additional Factors Info  Code Status, Allergies Code Status Info: DNR Allergies Info: Aspirin           Current Medications (11/12/2016):  This is the current hospital active medication list Current Facility-Administered Medications  Medication Dose Route Frequency Provider Last Rate Last  Dose  . torsemide (DEMADEX) tablet 20 mg  20 mg Oral Daily Hannah Muthersbaugh, PA-C   20 mg at 11/12/16 [id number]   Current Outpatient Prescriptions  Medication Sig Dispense Refill  . albuterol (PROVENTIL HFA;VENTOLIN HFA) 108 (90 Base) MCG/ACT inhaler Inhale 2 puffs into the lungs every 4 (four) hours as needed for wheezing or shortness of breath. 1 Inhaler 2  . ARIPiprazole (ABILIFY) 2 MG tablet take 1 tablet by mouth  every other day (Patient taking differently: Take 2 mg by mouth every other day) 45 tablet 0  . Ascorbic Acid (VITAMIN C) 1000 MG tablet Take 3,000 mg by mouth at bedtime.     Marland Kitchen atorvastatin (LIPITOR) 10 MG tablet Take 10 mg by mouth at bedtime.    . cetirizine (ZYRTEC) 10 MG tablet Take 10 mg by mouth daily.    . cyclobenzaprine (FLEXERIL) 10 MG tablet Take 1 tablet (10 mg total) by mouth 3 (three) times daily as needed for muscle spasms. 90 tablet 2  . fluticasone furoate-vilanterol (BREO ELLIPTA) 100-25 MCG/INH AEPB Inhale 1 puff into the lungs daily.    Marland Kitchen glipiZIDE (GLUCOTROL) 5 MG tablet take 1 tablet by mouth once daily (Patient taking differently: Take 5 mg by mouth every morning) 30 tablet 2  . hydrocerin (EUCERIN) CREA Apply 1 application topically 2 (two) times daily. 228 g 0  . meclizine (ANTIVERT) 25 MG tablet Take 25 mg by mouth every 6 (six) hours as needed for dizziness.    . nystatin (MYCOSTATIN) powder Apply topically 2 (two) times daily. Apply to affected skin BID (Patient taking differently: Apply topically 2 (two) times daily as needed. TO AFFECTED AREAS) 360 g 6  . Omega-3 Fatty Acids (FISH OIL) 1000 MG CAPS Take 2 capsules (2,000 mg total) by mouth at bedtime. 60 capsule 2  . oxyCODONE 10 MG TABS Take 1 tablet (10 mg total) by mouth every 6 (six) hours as needed for severe pain. 30 tablet 0  . oxyCODONE-acetaminophen (PERCOCET) 10-325 MG tablet Take 1 tablet by mouth every 8 (eight) hours.    . OXYGEN Inhale into the lungs. 4l/min    . polyethylene glycol (MIRALAX / GLYCOLAX) packet Take 17 g by mouth at bedtime.     . potassium chloride SA (K-DUR,KLOR-CON) 20 MEQ tablet Take 2 tablets (40 mEq total) by mouth daily. 180 tablet 0  . pregabalin (LYRICA) 150 MG capsule Take 1 capsule (150 mg total) by mouth 2 (two) times daily. 120 capsule 0  . tiotropium (SPIRIVA) 18 MCG inhalation capsule Place 1 capsule (18 mcg total) into inhaler and inhale daily. 30 capsule 2  . torsemide  (DEMADEX) 20 MG tablet Take 40 mg by mouth in the AM and 20 mg by mouth in the PM. (Patient taking differently: Take 40 mg by mouth 2 (two) times daily. ) 360 tablet 1  . traZODone (DESYREL) 150 MG tablet Take 2 tablets (300 mg total) by mouth at bedtime. 180 tablet 1  . venlafaxine XR (EFFEXOR-XR) 150 MG 24 hr capsule Take 2 capsules (300 mg total) by mouth daily. 180 capsule 0     Discharge Medications: Please see discharge summary for a list of discharge medications.  Relevant Imaging Results:  Relevant Lab Results:   Additional Information ss#[id number]  Judeth Horn, [name]

## 2016-11-12 NOTE — ED Notes (Signed)
Pt oxygen sat dropped into 60's. RN went to go check on pt and pt did not respond to voice. RN then light shook pt to awaken her, pt did respond after few attempts. Pt put on 6L of oxygen. PA notified.

## 2016-11-12 NOTE — ED Notes (Signed)
Pt given a snack 

## 2016-11-12 NOTE — ED Provider Notes (Signed)
8:20 AM Patient desaturating into the 60s. She uses BiPAP at home for her obesity hypoventilation OSA. I ordered BiPAP for the patient. She is lying asleep.  3:19 PM BP 102/63   Pulse 91   Temp 98.1 F (36.7 C) (Oral)   Resp 18   SpO2 97%  Patient still waiting for placement. Caryl Pina the Education officer, museum is currently working on placement. I have ordered the patient's meds and diet.    Margarita Mail, PA-C 11/12/16 1527

## 2016-11-12 NOTE — Progress Notes (Signed)
CSW engaged with Patient at her bedside to discuss disposition. CSW informed Patient that per previous nursing home, Patient has 5 days remaining and she will then enter her copay days per insurance authorization. Previous nursing facility, [facility], unable to take patient back at this time. Patient reports that she has not personally applied for Medicaid but thinks a Education officer, museum at [facility] may have completed it. CSW will follow up on this. CSW discussed that when she enters her copay days she will be billed by the facility for the co-pays. This was discussed along with the ramifications this will have at any other facility she is discharged to. Patient expressed understanding. CSW has referred Patient to SNF facilities. Awaiting potential bed offers.          Emiliano [name], [name] [facility] ED/61M Clinical Social Worker [phone]

## 2016-11-12 NOTE — ED Notes (Signed)
Delay in lab draw, pt receiving peri care at this time. 

## 2016-11-12 NOTE — ED Notes (Signed)
Patient transported to X-ray 

## 2016-11-12 NOTE — ED Notes (Signed)
Respiratory at bedside.

## 2016-11-12 NOTE — Progress Notes (Signed)
Pt has been accepted to [facility].  AVS faxed.  RN to call report and [facility] for pick-up.

## 2016-11-12 NOTE — Progress Notes (Addendum)
CSW has contacted Patient's previous SNF to inquire about remaining insurance co-pay days for SNF placement. If Patient has no remaining co-pay days available, Patient will have to pay privately for skilled nursing facility placement. CSW awaiting return phone call from previous SNF.     11:00A: CSW reached out to [facility] to inform them that Patient is presently in our ED as they were planning to send a social worker to Patient's home today or tomorrow to discuss care options. Per Gerald Stabs with [facility], Patient is being discharged from their services due to complexity of needs beyond home health care.   Emiliano [name], [name] [facility] ED/26M Clinical Social Worker [phone]

## 2016-11-12 NOTE — ED Notes (Signed)
Pt placed on bedpan. Urine was not able to stay in bedpan due to the bedpan being distorted. Pt did have a bowel movement.

## 2016-11-12 NOTE — Progress Notes (Signed)
Received call from Dr. Brett Albino. Patient recently discharged from SNF and home health agency, Patient is now in the ED.  Dr. Brett Albino request assistance with options for patient, as she is unable to return home and will not be admitted to the hospital.  Patient Lives with her brother who is now unable to care for her due to his own health limitations.  This is patient's only support system.  Patient has no Medicaid for LTC placement.   Plan: [name] will consult with Caryl Pina, ED-[name] for possible options.    Casimer Lanius, [name] Licensed Clinical Social Worker [facility]   [phone] 11:06 AM

## 2016-11-12 NOTE — ED Notes (Signed)
Pt placed on 35% venti mask per respiratory, oxygen sat 90-92%. Pt resting on stretcher with call bell within reach.

## 2016-11-12 NOTE — ED Triage Notes (Signed)
Pt to ED via [facility] ref. Pt not being able to take care of herself at home.  PT[facility] reports pt lives with her brother who is unable to care for her.  [facility] has been called  X's 6 today.

## 2016-11-12 NOTE — ED Provider Notes (Signed)
[facility] DEPT Provider Note   CSN: [id number] Arrival date & time: 11/12/16  0037     History   Chief Complaint Chief Complaint  Patient presents with  . Leg Pain    HPI Kathleen Roy is a 55 y.o. female with a hx of arthritis, chronic back pain, COPD, fibromyalgia, HTN presents to the Emergency Department complaining of gradual, persistent, progressively worsening bilateral knee pain, low back pain and bilateral shoulder pain onset several weeks ago.  Pt reports she has been calling EMS numerous times per day to help transfer from bed to couch and back.  Pt reports she was in a skilled nursing facility until last Wednesday (5 days ago) when she was forced to leave due to insurance issues.  She reports she is unable to care for herself at home and her brother cannot care for her either. Incorrect positioning makes the pain worse. Pt denies fever, chills, neck pain, abd pain, N/V/D.   Pt denies falls.  She reports taking oxycodone for the pain, last dose 2pm.  Patient reports increasing generalized weakness for the last several days. She reports she has been walking but is requiring more assistance than just her walker today due to increasing weakness.   The history is provided by the patient, the EMS personnel and medical records. No language interpreter was used.    Past Medical History:  Diagnosis Date  . Arthritis    "I feel like it's everywhere" (11/08/2014)  . Cellulitis and abscess of lower extremity 08/16/2016   right leg  . Chronic lower back pain   . COPD (chronic obstructive pulmonary disease) ([place])   . Depression   . Diabetes mellitus without complication ([place])    type 2   . Fibromyalgia   . Headache    "at least weekly; it's usually when I first wake up" (11/08/2014)  . Hyperlipidemia   . Hypertension   . HYPERTENSION, BENIGN SYSTEMIC 02/12/2007  . Memory disturbance   . Migraines 1970's - <2000   "they just went away"  . Neuropathy ([place])   . Normal  echocardiogram 05/30/05   suboptimal study  . Obesity, morbid (more than 100 lbs over ideal weight or BMI > 40) (HCC)    obese since childhood  . Osteoarthritis   . Peripheral edema   . Pressure ulcer of foot    left  . Sepsis ([place])   . Shortness of breath dyspnea   . Sleep apnea    "recently dx'd; haven't got my equipment yet" (11/08/2014)    Patient Active Problem List   Diagnosis Date Noted  . Cough   . Insomnia   . Palliative care by specialist   . Advance care planning   . Chronic pain of both knees   . Acute respiratory failure with hypoxia and hypercarbia ([place]) 10/15/2016  . General weakness   . Hypoxia 10/14/2016  . Bacteremia   . Cellulitis of right lower extremity 06/04/2016  . Type 2 diabetes mellitus ([place]) 02/14/2016  . Pressure ulcer 01/16/2016  . Acute on chronic respiratory failure with hypercapnia ([place])   . Chronic diastolic congestive heart failure ([place])   . Essential hypertension   . CAP (community acquired pneumonia) 01/15/2016  . Bilateral knee pain 03/22/2015  . OSA (obstructive sleep apnea)   . Obesity hypoventilation syndrome ([place])   . Chronic respiratory failure with hypoxia ([place])   . Assistance needed for grooming 11/08/2014  . Fatigue 11/03/2014  . Memory loss 03/18/2014  . Tremor 03/18/2014  .  Vulvovaginal candidiasis 11/25/2013  . Restrictive airway disease 08/26/2013  . Hypokalemia 08/19/2013  . Health care maintenance 06/17/2013  . Venous stasis dermatitis of both lower extremities 12/02/2012  . COPD (chronic obstructive pulmonary disease) ([place]) 10/15/2011  . Peripheral neuropathy ([place]) 10/15/2011  . KNEE PAIN, BILATERAL 01/22/2011  . BACK PAIN, LUMBAR, CHRONIC 01/22/2011  . MIGRAINE HEADACHE 10/25/2010  . HYPERLIPIDEMIA 04/07/2009  . EDEMA, CHRONIC 06/27/2008  . Morbid obesity ([place]) 11/16/2007  . DEPRESSION, MAJOR, RECURRENT 02/12/2007  . PANIC ATTACKS 02/12/2007  . HYPERTENSION, BENIGN SYSTEMIC 02/12/2007    Past Surgical  History:  Procedure Laterality Date  . ABDOMINAL HERNIA REPAIR  2002  . APPENDECTOMY  1995  . CHOLECYSTECTOMY  1995  . TOTAL ABDOMINAL HYSTERECTOMY  1994    OB History    Gravida Para Term Preterm AB Living   0 0 0 0 0 0   SAB TAB Ectopic Multiple Live Births   0 0 0 0         Home Medications    Prior to Admission medications   Medication Sig Start Date End Date Taking? Authorizing Provider  albuterol (PROVENTIL HFA;VENTOLIN HFA) 108 (90 Base) MCG/ACT inhaler Inhale 2 puffs into the lungs every 4 (four) hours as needed for wheezing or shortness of breath. 10/17/16   Lovenia Kim, MD  ARIPiprazole (ABILIFY) 2 MG tablet take 1 tablet by mouth every other day Patient taking differently: Take 2 mg by mouth every other day 03/08/16   Sela Hua, MD  Ascorbic Acid (VITAMIN C) 1000 MG tablet Take 3,000 mg by mouth at bedtime.     Historical Provider, MD  atorvastatin (LIPITOR) 10 MG tablet Take 10 mg by mouth at bedtime.    Historical Provider, MD  cetirizine (ZYRTEC) 10 MG tablet Take 10 mg by mouth daily.    Historical Provider, MD  cyclobenzaprine (FLEXERIL) 10 MG tablet Take 1 tablet (10 mg total) by mouth 3 (three) times daily as needed for muscle spasms. 06/17/16   Sela Hua, MD  glipiZIDE (GLUCOTROL) 5 MG tablet take 1 tablet by mouth once daily Patient taking differently: Take 5 mg by mouth every morning 06/28/16   Sela Hua, MD  hydrocerin (EUCERIN) CREA Apply 1 application topically 2 (two) times daily. 08/22/16   Steve Rattler, DO  NONFORMULARY OR COMPOUNDED ITEM Mineral Oil/Eucerin cream: Apply to affected area twice a day    Historical Provider, MD  nystatin (MYCOSTATIN) powder Apply topically 2 (two) times daily. Apply to affected skin BID Patient taking differently: Apply topically 2 (two) times daily as needed. TO AFFECTED AREAS 10/26/15   [name], MD  Omega-3 Fatty Acids (FISH OIL) 1000 MG CAPS Take 2 capsules (2,000 mg total) by mouth at bedtime.  06/10/16   Sela Hua, MD  oxyCODONE 10 MG TABS Take 1 tablet (10 mg total) by mouth every 6 (six) hours as needed for severe pain. 10/21/16   Lovenia Kim, MD  oxyCODONE-acetaminophen (PERCOCET) 10-325 MG tablet Take 1 tablet by mouth every 8 (eight) hours.    Historical Provider, MD  OXYGEN Inhale into the lungs. 4l/min    Historical Provider, MD  polyethylene glycol (MIRALAX / GLYCOLAX) packet Take 17 g by mouth at bedtime.     Historical Provider, MD  potassium chloride SA (K-DUR,KLOR-CON) 20 MEQ tablet Take 2 tablets (40 mEq total) by mouth daily. 06/04/16   Sela Hua, MD  pregabalin (LYRICA) 150 MG capsule Take 1 capsule (150 mg total)  by mouth 2 (two) times daily. 08/22/16   Steve Rattler, DO  tiotropium (SPIRIVA) 18 MCG inhalation capsule Place 1 capsule (18 mcg total) into inhaler and inhale daily. 10/18/16   Lovenia Kim, MD  torsemide (DEMADEX) 20 MG tablet Take 40 mg by mouth in the AM and 20 mg by mouth in the PM. 10/21/16   Lovenia Kim, MD  traZODone (DESYREL) 150 MG tablet Take 2 tablets (300 mg total) by mouth at bedtime. 04/02/16   Sela Hua, MD  venlafaxine XR (EFFEXOR-XR) 150 MG 24 hr capsule Take 2 capsules (300 mg total) by mouth daily. 08/07/16   Sela Hua, MD    Family History Family History  Problem Relation Age of Onset  . Stroke Mother   . Dementia Father   . Prostate cancer Father   . Congestive Heart Failure Brother   . Diabetes Brother   . Hypertension Brother     Social History Social History  Substance Use Topics  . Smoking status: Former Smoker    Packs/day: 1.00    Years: 30.00    Types: Cigarettes    Quit date: 01/05/2009  . Smokeless tobacco: Never Used  . Alcohol use No     Allergies   Aspirin   Review of Systems Review of Systems  Musculoskeletal: Positive for arthralgias and back pain ( chronic).  Neurological: Positive for weakness ( Generalized).  All other systems reviewed and are negative.    Physical Exam Updated  Vital Signs BP 147/58   Pulse 75   Temp 98.1 F (36.7 C) (Oral)   Resp 20   SpO2 98%   Physical Exam  Constitutional: She appears well-developed and well-nourished. No distress.  Awake, alert, nontoxic appearance Morbidly obese  HENT:  Head: Normocephalic and atraumatic.  Mouth/Throat: No oropharyngeal exudate.  Eyes: Conjunctivae are normal. No scleral icterus.  Neck: Normal range of motion. Neck supple.  Cardiovascular: Normal rate and regular rhythm.   Pulses:      Radial pulses are 2+ on the right side, and 2+ on the left side.  Unable to palpate dorsalis pedis pulses due to severe edema; capillary refill less than 3 seconds in the lower extremities  Pulmonary/Chest: Effort normal. No respiratory distress. She has decreased breath sounds ( Diminished throughout, no focal sounds).  Equal chest expansion  Abdominal: Soft. Bowel sounds are normal. She exhibits no mass. There is no tenderness. There is no rebound and no guarding.  Musculoskeletal: Normal range of motion. She exhibits no edema.  Neurological: She is alert.  Speech is clear and goal oriented Moves extremities without ataxia  Skin: Skin is warm and dry. She is not diaphoretic.  Right leg with severe edema and weeping of clear serous fluid along with erythema from the ankle to distal calf; mild increase in warmth but no induration or purulent discharge  Psychiatric: She has a normal mood and affect.  Nursing note and vitals reviewed.    ED Treatments / Results  Labs (all labs ordered are listed, but only abnormal results are displayed) Labs Reviewed  CBC WITH DIFFERENTIAL/PLATELET - Abnormal; Notable for the following:       Result Value   RBC 3.70 (*)    Hemoglobin 10.5 (*)    HCT 35.4 (*)    MCHC 29.7 (*)    RDW 16.3 (*)    Platelets 145 (*)    All other components within normal limits  COMPREHENSIVE METABOLIC PANEL - Abnormal; Notable for the following:  Chloride 99 (*)    CO2 36 (*)    Albumin 3.4  (*)    ALT 10 (*)    All other components within normal limits  URINALYSIS, ROUTINE W REFLEX MICROSCOPIC (NOT AT [facility]) - Abnormal; Notable for the following:    APPearance CLOUDY (*)    Leukocytes, UA LARGE (*)    All other components within normal limits  URINE MICROSCOPIC-ADD ON - Abnormal; Notable for the following:    Squamous Epithelial / LPF 6-30 (*)    Bacteria, UA FEW (*)    Crystals CA OXALATE CRYSTALS (*)    All other components within normal limits  I-STAT CG4 LACTIC ACID, ED    EKG  EKG Interpretation  Date/Time:  Tuesday November 12 2016 03:30:35 EST Ventricular Rate:  84 PR Interval:    QRS Duration: 151 QT Interval:  418 QTC Calculation: 495 R Axis:   -90 Text Interpretation:  Sinus rhythm Ventricular premature complex Nonspecific IVCD with LAD Left ventricular hypertrophy Lateral infarct, age indeterminate Confirmed by HORTON  MD, [place] ([id number]) on 11/12/2016 3:35:00 AM       Radiology Dg Chest 2 View  Result Date: 11/12/2016 CLINICAL DATA:  Shoulder pain EXAM: CHEST  2 VIEW COMPARISON:  Chest radiograph 10/18/2016 FINDINGS: Marked rotation again limits the study. Cardiomegaly is unchanged. There is no large area of consolidation. Mild interstitial opacities are unchanged. No pneumothorax or pleural effusion. IMPRESSION: Examination is limited by patient rotation. Within that limitation, no visualized acute cardiopulmonary process. Electronically Signed   By: Ulyses Jarred M.D.   On: 11/12/2016 03:30    Procedures Procedures (including critical care time)  Medications Ordered in ED Medications  torsemide (DEMADEX) tablet 20 mg (not administered)  oxyCODONE (Oxy IR/ROXICODONE) immediate release tablet 10 mg (10 mg Oral Given 11/12/16 0244)     Initial Impression / Assessment and Plan / ED Course  I have reviewed the triage vital signs and the nursing notes.  Pertinent labs & imaging results that were available during my care of the patient were  reviewed by me and considered in my medical decision making (see chart for details).  Clinical Course as of Nov 12 652  Tue Nov 12, 2016  0405 Dr. Gerarda Fraction will talk to her oncoming team including pt's PCP who can coordinate social work and PCP evaluation.   [HM]  0523 Mild anemia, baseline Hemoglobin: (!) 10.5 [HM]  0524 Elevated, baseline CO2: (!) 36 [HM]  0524 Large leukocytes but negative nitrites and 0-5 white blood cells. No evidence of urinary tract infection. Leukocytes, UA: (!) LARGE [HM]  0524 Within normal limits Lactic Acid, Venous: 0.92 [HM]  0525 No evidence of pneumonia DG Chest 2 View [HM]  [phone] At shift change care was transferred to Margarita Mail, PA-C who will follow-up with Social work, Family Medicine and determine disposition.  [HM]    Clinical Course User Index [HM] Abigail Butts, PA-C   Patient presents with worsening generalized weakness for the last several days. This occurred after she was discharged from the skilled nursing facility. Her brother is unable to care for her at home and they have been calling EMS greater than 6 times per day to help with transfers.  Labs and urine are reassuring. Right leg with worsening edema and weeping of clear serous fluid. The leg is erythematous and slightly warm but appears to be working consistent with venous stasis and increased peripheral edema than with infection. Patient is afebrile. No leukocytosis.   Final Clinical  Impressions(s) / ED Diagnoses   Final diagnoses:  Generalized weakness  Chronic arthralgias of knees and hips  Chronic bilateral low back pain without sciatica    New Prescriptions New Prescriptions   No medications on file     Abigail Butts, PA-C 11/12/16 [place], MD 11/13/16 [phone]

## 2016-11-12 NOTE — Telephone Encounter (Signed)
[facility]: home health is going to have to discharge her because her needs are over and beyond home health care abilities.  [facility] is suppose to be putting on unni boot on her today but then who would change the unni boot.  There will be a Education officer, museum going to visit today or tomorrow to discuss options.

## 2016-11-13 DIAGNOSIS — E782 Mixed hyperlipidemia: Secondary | ICD-10-CM | POA: Diagnosis not present

## 2016-11-13 DIAGNOSIS — E1142 Type 2 diabetes mellitus with diabetic polyneuropathy: Secondary | ICD-10-CM | POA: Diagnosis not present

## 2016-11-13 DIAGNOSIS — Z7409 Other reduced mobility: Secondary | ICD-10-CM | POA: Diagnosis not present

## 2016-11-13 DIAGNOSIS — I5032 Chronic diastolic (congestive) heart failure: Secondary | ICD-10-CM | POA: Diagnosis not present

## 2016-11-13 DIAGNOSIS — J9611 Chronic respiratory failure with hypoxia: Secondary | ICD-10-CM | POA: Diagnosis not present

## 2016-11-13 DIAGNOSIS — L03115 Cellulitis of right lower limb: Secondary | ICD-10-CM | POA: Diagnosis not present

## 2016-11-13 DIAGNOSIS — M79606 Pain in leg, unspecified: Secondary | ICD-10-CM | POA: Diagnosis not present

## 2016-11-13 DIAGNOSIS — E118 Type 2 diabetes mellitus with unspecified complications: Secondary | ICD-10-CM | POA: Diagnosis not present

## 2016-11-13 DIAGNOSIS — J449 Chronic obstructive pulmonary disease, unspecified: Secondary | ICD-10-CM | POA: Diagnosis not present

## 2016-11-14 ENCOUNTER — Telehealth: Payer: Self-pay | Admitting: Internal Medicine

## 2016-11-14 ENCOUNTER — Telehealth: Payer: Self-pay | Admitting: Licensed Clinical Social Worker

## 2016-11-14 DIAGNOSIS — J449 Chronic obstructive pulmonary disease, unspecified: Secondary | ICD-10-CM | POA: Diagnosis not present

## 2016-11-14 DIAGNOSIS — E782 Mixed hyperlipidemia: Secondary | ICD-10-CM | POA: Diagnosis not present

## 2016-11-14 DIAGNOSIS — I5032 Chronic diastolic (congestive) heart failure: Secondary | ICD-10-CM | POA: Diagnosis not present

## 2016-11-14 DIAGNOSIS — E1142 Type 2 diabetes mellitus with diabetic polyneuropathy: Secondary | ICD-10-CM | POA: Diagnosis not present

## 2016-11-14 NOTE — Telephone Encounter (Signed)
Pt stated something bad happened to her brother and was crying and very upset. Pt wants PCP to call her on her cell phone. Please advise. Thanks! ep

## 2016-11-14 NOTE — Progress Notes (Signed)
Call from patient's brother [name] asking for resources that would assist patient with transportation to her appointment.  States patient is currently in rehab but will be coming home in the next few days. His main concern is that patient is unable to get up and down the stairs leading into their home.  Per her 54 yr old Brother, he is patients' only support system. Concerned with how long he will be able to care for her. Brother states patient has not been to a doctor's appointment since the first of this year due to not being able to get in and out of the car.    The following was discussed: Long-term care options, applying for Medicaid, resources provided to assist with building a ramp and general transportation options.  Emotional support provided with reflective listening.    Called patient to discuss transportation options.  Provided patient with phone number to call her insurance company to inquire about a policy that includes transportation. Discussed other transportation options (SCAT and co-pay when using EMS).  Also discussed applying for Medicaid.  Patient appreciative of the call.  Plan:  1. Patient will call [facility] to inquire about transportation for her policy  2. Patient will complete SCAT application  3. Patient will follow up Medicaid application  4. Patient will call [name] if additional information or assistance is needed   5. [name] will look to see if patient qualifies for [facility] supportive services  6. [name] will follow up with patient to see if she has made progress on the above.  Casimer Lanius, [name] Licensed Clinical Social Worker [place]   [phone] 3:45 PM

## 2016-11-15 ENCOUNTER — Other Ambulatory Visit: Payer: Self-pay | Admitting: Internal Medicine

## 2016-11-15 DIAGNOSIS — J438 Other emphysema: Secondary | ICD-10-CM

## 2016-11-15 NOTE — Telephone Encounter (Signed)
Called patient and addressed her concerns. She will be leaving the nursing home today.

## 2016-11-15 NOTE — Progress Notes (Signed)
Spoke with patient over the phone. She will be leaving the SNF today. She wants her home health resumed. I have placed orders for HHPT, [facility] aide, and [facility]. We will need to fax the orders to [phone].  Hyman Bible, MD

## 2016-11-18 ENCOUNTER — Ambulatory Visit: Payer: Medicare Other | Admitting: Internal Medicine

## 2016-11-19 ENCOUNTER — Other Ambulatory Visit: Payer: Self-pay | Admitting: Internal Medicine

## 2016-11-19 DIAGNOSIS — J438 Other emphysema: Secondary | ICD-10-CM

## 2016-11-20 ENCOUNTER — Telehealth: Payer: Self-pay | Admitting: Licensed Clinical Social Worker

## 2016-11-20 NOTE — Progress Notes (Signed)
[name] spoke with PCP about possible referral to [facility].  [facility] order placed by PCP.  Called patient to explain [facility] services.  Patient is appreciative of the referral and looking forward to talking with the [facility] care manager.  Casimer Lanius, [name] Licensed Clinical Social Worker [place]   [phone] 1:28 PM

## 2016-11-22 ENCOUNTER — Ambulatory Visit: Payer: Medicare Other | Admitting: Internal Medicine

## 2016-11-24 ENCOUNTER — Encounter (HOSPITAL_COMMUNITY): Payer: Self-pay

## 2016-11-24 ENCOUNTER — Emergency Department (HOSPITAL_COMMUNITY)
Admission: EM | Admit: 2016-11-24 | Discharge: 2016-11-26 | Disposition: A | Discharge status: Home / Self Care | Payer: Medicare Other | Source: Home / Self Care | Attending: Emergency Medicine | Admitting: Emergency Medicine

## 2016-11-24 ENCOUNTER — Emergency Department (HOSPITAL_COMMUNITY): Payer: Medicare Other

## 2016-11-24 DIAGNOSIS — R29898 Other symptoms and signs involving the musculoskeletal system: Secondary | ICD-10-CM

## 2016-11-24 DIAGNOSIS — Z6841 Body Mass Index (BMI) 40.0 and over, adult: Secondary | ICD-10-CM | POA: Diagnosis not present

## 2016-11-24 DIAGNOSIS — R531 Weakness: Secondary | ICD-10-CM | POA: Diagnosis not present

## 2016-11-24 DIAGNOSIS — J449 Chronic obstructive pulmonary disease, unspecified: Secondary | ICD-10-CM | POA: Insufficient documentation

## 2016-11-24 DIAGNOSIS — M6281 Muscle weakness (generalized): Secondary | ICD-10-CM | POA: Insufficient documentation

## 2016-11-24 DIAGNOSIS — Z87891 Personal history of nicotine dependence: Secondary | ICD-10-CM

## 2016-11-24 DIAGNOSIS — I10 Essential (primary) hypertension: Secondary | ICD-10-CM

## 2016-11-24 DIAGNOSIS — I110 Hypertensive heart disease with heart failure: Secondary | ICD-10-CM

## 2016-11-24 DIAGNOSIS — I5032 Chronic diastolic (congestive) heart failure: Secondary | ICD-10-CM | POA: Insufficient documentation

## 2016-11-24 DIAGNOSIS — E785 Hyperlipidemia, unspecified: Secondary | ICD-10-CM | POA: Diagnosis not present

## 2016-11-24 DIAGNOSIS — L03115 Cellulitis of right lower limb: Secondary | ICD-10-CM | POA: Diagnosis not present

## 2016-11-24 DIAGNOSIS — R5381 Other malaise: Secondary | ICD-10-CM | POA: Insufficient documentation

## 2016-11-24 DIAGNOSIS — Z66 Do not resuscitate: Secondary | ICD-10-CM | POA: Diagnosis not present

## 2016-11-24 DIAGNOSIS — L03113 Cellulitis of right upper limb: Secondary | ICD-10-CM | POA: Diagnosis not present

## 2016-11-24 DIAGNOSIS — Z7984 Long term (current) use of oral hypoglycemic drugs: Secondary | ICD-10-CM

## 2016-11-24 DIAGNOSIS — E1140 Type 2 diabetes mellitus with diabetic neuropathy, unspecified: Secondary | ICD-10-CM

## 2016-11-24 DIAGNOSIS — F410 Panic disorder [episodic paroxysmal anxiety] without agoraphobia: Secondary | ICD-10-CM | POA: Diagnosis not present

## 2016-11-24 DIAGNOSIS — R0602 Shortness of breath: Secondary | ICD-10-CM | POA: Diagnosis not present

## 2016-11-24 DIAGNOSIS — R509 Fever, unspecified: Secondary | ICD-10-CM

## 2016-11-24 DIAGNOSIS — G629 Polyneuropathy, unspecified: Secondary | ICD-10-CM | POA: Diagnosis not present

## 2016-11-24 DIAGNOSIS — E662 Morbid (severe) obesity with alveolar hypoventilation: Secondary | ICD-10-CM | POA: Diagnosis not present

## 2016-11-24 DIAGNOSIS — R7303 Prediabetes: Secondary | ICD-10-CM | POA: Diagnosis not present

## 2016-11-24 DIAGNOSIS — Z79899 Other long term (current) drug therapy: Secondary | ICD-10-CM | POA: Insufficient documentation

## 2016-11-24 DIAGNOSIS — F339 Major depressive disorder, recurrent, unspecified: Secondary | ICD-10-CM | POA: Diagnosis not present

## 2016-11-24 LAB — CBC WITH DIFFERENTIAL/PLATELET
BASOS ABS: 0 10*3/uL (ref 0.0–0.1)
BASOS PCT: 0 %
Eosinophils Absolute: 0 10*3/uL (ref 0.0–0.7)
Eosinophils Relative: 0 %
HEMATOCRIT: 34 % — AB (ref 36.0–46.0)
Hemoglobin: 10.3 g/dL — ABNORMAL LOW (ref 12.0–15.0)
Lymphocytes Relative: 5 %
Lymphs Abs: 0.5 10*3/uL — ABNORMAL LOW (ref 0.7–4.0)
MCH: 28.5 pg (ref 26.0–34.0)
MCHC: 30.3 g/dL (ref 30.0–36.0)
MCV: 93.9 fL (ref 78.0–100.0)
MONO ABS: 0.7 10*3/uL (ref 0.1–1.0)
Monocytes Relative: 6 %
NEUTROS ABS: 10.5 10*3/uL — AB (ref 1.7–7.7)
NEUTROS PCT: 89 %
PLATELETS: 228 10*3/uL (ref 150–400)
RBC: 3.62 MIL/uL — AB (ref 3.87–5.11)
RDW: 16.1 % — AB (ref 11.5–15.5)
WBC: 11.7 10*3/uL — AB (ref 4.0–10.5)

## 2016-11-24 LAB — COMPREHENSIVE METABOLIC PANEL
ALT: 13 U/L — ABNORMAL LOW (ref 14–54)
ANION GAP: 10 (ref 5–15)
AST: 21 U/L (ref 15–41)
Albumin: 4 g/dL (ref 3.5–5.0)
Alkaline Phosphatase: 81 U/L (ref 38–126)
BILIRUBIN TOTAL: 0.9 mg/dL (ref 0.3–1.2)
BUN: 15 mg/dL (ref 6–20)
CO2: 40 mmol/L — ABNORMAL HIGH (ref 22–32)
Calcium: 9.3 mg/dL (ref 8.9–10.3)
Chloride: 90 mmol/L — ABNORMAL LOW (ref 101–111)
Creatinine, Ser: 0.84 mg/dL (ref 0.44–1.00)
Glucose, Bld: 108 mg/dL — ABNORMAL HIGH (ref 65–99)
POTASSIUM: 3.7 mmol/L (ref 3.5–5.1)
Sodium: 140 mmol/L (ref 135–145)
TOTAL PROTEIN: 7.9 g/dL (ref 6.5–8.1)

## 2016-11-24 LAB — BLOOD GAS, ARTERIAL
ACID-BASE EXCESS: 16.4 mmol/L — AB (ref 0.0–2.0)
Bicarbonate: 44 mmol/L — ABNORMAL HIGH (ref 20.0–28.0)
Drawn by: 308601
O2 CONTENT: 2 L/min
O2 SAT: 91.1 %
PCO2 ART: 75.2 mmHg — AB (ref 32.0–48.0)
PO2 ART: 64.3 mmHg — AB (ref 83.0–108.0)
Patient temperature: 98.6
pH, Arterial: 7.385 (ref 7.350–7.450)

## 2016-11-24 LAB — PROTIME-INR
INR: 1.08
Prothrombin Time: 14.1 seconds (ref 11.4–15.2)

## 2016-11-24 LAB — I-STAT CG4 LACTIC ACID, ED: LACTIC ACID, VENOUS: 1.05 mmol/L (ref 0.5–1.9)

## 2016-11-24 NOTE — ED Triage Notes (Signed)
Patient bib [facility].  Per EMS they were called to the home to see patient.  Patient was sitting on bedside commode sleeping and unable to get up on her own.  Patient c/o shaking and febrile and weak all day along with other multiple body complaints.  Per EMS she took 1000mg  PO tylenol at home.

## 2016-11-24 NOTE — ED Provider Notes (Addendum)
[name]   CSN: [id number] Arrival date & time: 11/24/16  2149 By signing my name below, I, Kathleen Roy, attest that this documentation has been prepared under the direction and in the presence of Kathleen Hacker, MD . Electronically Signed: Dyke Roy, Scribe. 11/24/2016. 11:23 PM.   History   Chief Complaint Chief Complaint  Patient presents with  . Fever   Level V Caveat: Pt keeps falling asleep  HPI Kathleen Roy is a 55 y.o. female with a hx of COPD and DM, under palliative care, who presents to the Emergency Department complaining of chills onset today. Per pt, she has been shaking all day. She notes associated fatigue and generalized weakness. Per pt, "I just didn't feel like myself". Pt is on 4L/min O2 chronically. She denies any urinary symptoms.   The history is provided by the patient. History limited by: somnolence. No language interpreter was used.   Past Medical History:  Diagnosis Date  . Arthritis    "I feel like it's everywhere" (11/08/2014)  . Cellulitis and abscess of lower extremity 08/16/2016   right leg  . Chronic lower back pain   . COPD (chronic obstructive pulmonary disease) ([place])   . Depression   . Diabetes mellitus without complication ([place])    type 2   . Fibromyalgia   . Headache    "at least weekly; it's usually when I first wake up" (11/08/2014)  . Hyperlipidemia   . Hypertension   . HYPERTENSION, BENIGN SYSTEMIC 02/12/2007  . Memory disturbance   . Migraines 1970's - <2000   "they just went away"  . Neuropathy ([place])   . Normal echocardiogram 05/30/05   suboptimal study  . Obesity, morbid (more than 100 lbs over ideal weight or BMI > 40) (HCC)    obese since childhood  . Osteoarthritis   . Peripheral edema   . Pressure ulcer of foot    left  . Sepsis ([place])   . Shortness of breath dyspnea   . Sleep apnea    "recently dx'd; haven't got my equipment yet" (11/08/2014)    Patient Active Problem List   Diagnosis Date Noted  . Cough   . Insomnia   . Palliative care by specialist   . Advance care planning   . Chronic pain of both knees   . Acute respiratory failure with hypoxia and hypercarbia ([place]) 10/15/2016  . General weakness   . Hypoxia 10/14/2016  . Bacteremia   . Cellulitis of right lower extremity 06/04/2016  . Type 2 diabetes mellitus ([place]) 02/14/2016  . Pressure ulcer 01/16/2016  . Acute on chronic respiratory failure with hypercapnia ([place])   . Chronic diastolic congestive heart failure ([place])   . Essential hypertension   . CAP (community acquired pneumonia) 01/15/2016  . Bilateral knee pain 03/22/2015  . OSA (obstructive sleep apnea)   . Obesity hypoventilation syndrome ([place])   . Chronic respiratory failure with hypoxia ([place])   . Assistance needed for grooming 11/08/2014  . Fatigue 11/03/2014  . Memory loss 03/18/2014  . Tremor 03/18/2014  . Vulvovaginal candidiasis 11/25/2013  . Restrictive airway disease 08/26/2013  . Hypokalemia 08/19/2013  . Health care maintenance 06/17/2013  . Venous stasis dermatitis of both lower extremities 12/02/2012  . COPD (chronic obstructive pulmonary disease) ([place]) 10/15/2011  . Peripheral neuropathy ([place]) 10/15/2011  . KNEE PAIN, BILATERAL 01/22/2011  . BACK PAIN, LUMBAR, CHRONIC 01/22/2011  . MIGRAINE HEADACHE 10/25/2010  . HYPERLIPIDEMIA 04/07/2009  . EDEMA, CHRONIC 06/27/2008  .  Morbid obesity ([place]) 11/16/2007  . DEPRESSION, MAJOR, RECURRENT 02/12/2007  . PANIC ATTACKS 02/12/2007  . HYPERTENSION, BENIGN SYSTEMIC 02/12/2007    Past Surgical History:  Procedure Laterality Date  . ABDOMINAL HERNIA REPAIR  2002  . APPENDECTOMY  1995  . CHOLECYSTECTOMY  1995  . TOTAL ABDOMINAL HYSTERECTOMY  1994    OB History    Gravida Para Term Preterm AB Living   0 0 0 0 0 0   SAB TAB Ectopic Multiple Live Births   0 0 0 0         Home Medications    Prior to Admission medications   Medication Sig Start Date End Date Taking?  Authorizing Provider  albuterol (PROVENTIL HFA;VENTOLIN HFA) 108 (90 Base) MCG/ACT inhaler Inhale 2 puffs into the lungs every 4 (four) hours as needed for wheezing or shortness of breath. 10/17/16   Lovenia Kim, MD  ARIPiprazole (ABILIFY) 2 MG tablet take 1 tablet by mouth every other day Patient taking differently: Take 2 mg by mouth every other day 03/08/16   Sela Hua, MD  Ascorbic Acid (VITAMIN C) 1000 MG tablet Take 3,000 mg by mouth at bedtime.     Historical Provider, MD  atorvastatin (LIPITOR) 10 MG tablet Take 10 mg by mouth at bedtime.    Historical Provider, MD  cetirizine (ZYRTEC) 10 MG tablet Take 10 mg by mouth daily.    Historical Provider, MD  cyclobenzaprine (FLEXERIL) 10 MG tablet Take 1 tablet (10 mg total) by mouth 3 (three) times daily as needed for muscle spasms. 06/17/16   Sela Hua, MD  fluticasone furoate-vilanterol (BREO ELLIPTA) 100-25 MCG/INH AEPB Inhale 1 puff into the lungs daily.    Historical Provider, MD  glipiZIDE (GLUCOTROL) 5 MG tablet take 1 tablet by mouth once daily Patient taking differently: Take 5 mg by mouth every morning 06/28/16   Sela Hua, MD  hydrocerin (EUCERIN) CREA Apply 1 application topically 2 (two) times daily. 08/22/16   Steve Rattler, DO  meclizine (ANTIVERT) 25 MG tablet Take 25 mg by mouth every 6 (six) hours as needed for dizziness. 11/06/16   Historical Provider, MD  nystatin (MYCOSTATIN) powder Apply topically 2 (two) times daily. Apply to affected skin BID Patient taking differently: Apply topically 2 (two) times daily as needed. TO AFFECTED AREAS 10/26/15   [name], MD  Omega-3 Fatty Acids (FISH OIL) 1000 MG CAPS Take 2 capsules (2,000 mg total) by mouth at bedtime. 06/10/16   Sela Hua, MD  Oxycodone HCl 10 MG TABS Take 1 tablet (10 mg total) by mouth every 6 (six) hours as needed. 11/12/16   Davonna Belling, MD  oxyCODONE-acetaminophen (PERCOCET) 10-325 MG tablet Take 1 tablet by mouth every 8 (eight) hours.     Historical Provider, MD  OXYGEN Inhale into the lungs. 4l/min    Historical Provider, MD  polyethylene glycol (MIRALAX / GLYCOLAX) packet Take 17 g by mouth at bedtime.     Historical Provider, MD  potassium chloride SA (K-DUR,KLOR-CON) 20 MEQ tablet Take 2 tablets (40 mEq total) by mouth daily. 06/04/16   Sela Hua, MD  pregabalin (LYRICA) 150 MG capsule Take 1 capsule (150 mg total) by mouth 2 (two) times daily. 08/22/16   Steve Rattler, DO  tiotropium (SPIRIVA) 18 MCG inhalation capsule Place 1 capsule (18 mcg total) into inhaler and inhale daily. 10/18/16   Lovenia Kim, MD  torsemide (DEMADEX) 20 MG tablet Take 40 mg by mouth in the  AM and 20 mg by mouth in the PM. Patient taking differently: Take 40 mg by mouth 2 (two) times daily.  10/21/16   Lovenia Kim, MD  traZODone (DESYREL) 150 MG tablet Take 2 tablets (300 mg total) by mouth at bedtime. 04/02/16   Sela Hua, MD  venlafaxine XR (EFFEXOR-XR) 150 MG 24 hr capsule Take 2 capsules (300 mg total) by mouth daily. 08/07/16   Sela Hua, MD    Family History Family History  Problem Relation Age of Onset  . Stroke Mother   . Dementia Father   . Prostate cancer Father   . Congestive Heart Failure Brother   . Diabetes Brother   . Hypertension Brother     Social History Social History  Substance Use Topics  . Smoking status: Former Smoker    Packs/day: 1.00    Years: 30.00    Types: Cigarettes    Quit date: 01/05/2009  . Smokeless tobacco: Never Used  . Alcohol use No     Allergies   Aspirin  Review of Systems Review of Systems  Unable to perform ROS: Other  Constitutional: Positive for chills and fatigue.  Respiratory: Negative for shortness of breath.   Cardiovascular: Negative for chest pain.  Genitourinary: Negative.   Neurological: Positive for weakness.   Physical Exam Updated Vital Signs BP (!) 113/53   Pulse 99   Temp 98.2 F (36.8 C) (Oral)   Resp 20   Wt (!) 400 lb (181.4 kg)   SpO2 93%    BMI 73.16 kg/m   Physical Exam  Constitutional: She is oriented to person, place, and time.  Morbidly obese, chronically ill-appearing  HENT:  Head: Normocephalic and atraumatic.  Eyes: Pupils are equal, round, and reactive to light.  Pupils 4 mm reactive bilaterally  Neck: Neck supple.  Cardiovascular: Normal rate, regular rhythm and normal heart sounds.   No murmur heard. Pulmonary/Chest: Effort normal. No respiratory distress. She has no wheezes.  Nasal cannula in place, pulmonary exam limited secondary to body habitus  Abdominal: Soft. Bowel sounds are normal. There is no tenderness. There is no guarding.  Musculoskeletal:  3+ bilateral lower extremity edema  Neurological: She is alert and oriented to person, place, and time.  Somnolent but arousable and oriented 3  Skin: Skin is warm and dry.  Right greater than left lower extremity erythema, no induration, mild oozing noted right lower extremity  Psychiatric:  Sleepy, strange affect  Nursing note and vitals reviewed.   ED Treatments / Results  DIAGNOSTIC STUDIES:  Oxygen Saturation is 96% on RA, adequate by my interpretation.    COORDINATION OF CARE:  11:08 PM Discussed treatment plan with pt at bedside and pt agreed to plan.  Labs (all labs ordered are listed, but only abnormal results are displayed) Labs Reviewed  COMPREHENSIVE METABOLIC PANEL - Abnormal; Notable for the following:       Result Value   Chloride 90 (*)    CO2 40 (*)    Glucose, Bld 108 (*)    ALT 13 (*)    All other components within normal limits  CBC WITH DIFFERENTIAL/PLATELET - Abnormal; Notable for the following:    WBC 11.7 (*)    RBC 3.62 (*)    Hemoglobin 10.3 (*)    HCT 34.0 (*)    RDW 16.1 (*)    Neutro Abs 10.5 (*)    Lymphs Abs 0.5 (*)    All other components within normal limits  BLOOD GAS, ARTERIAL -  Abnormal; Notable for the following:    pCO2 arterial 75.2 (*)    pO2, Arterial 64.3 (*)    Bicarbonate 44.0 (*)     Acid-Base Excess 16.4 (*)    All other components within normal limits  CULTURE, BLOOD (ROUTINE X 2)  CULTURE, BLOOD (ROUTINE X 2)  URINE CULTURE  PROTIME-INR  URINALYSIS, ROUTINE W REFLEX MICROSCOPIC  I-STAT CG4 LACTIC ACID, ED    EKG  EKG Interpretation None       Radiology Dg Chest 2 View  Result Date: 11/24/2016 CLINICAL DATA:  55 year old female with fever EXAM: CHEST  2 VIEW COMPARISON:  Chest radiograph dated 11/12/2016 FINDINGS: The patient is slightly rotated to the left. There is moderate cardiomegaly with mild vascular prominence compatible with congestive changes. No pulmonary edema. There is no focal consolidation, pleural effusion, or pneumothorax. There is degenerative changes of the spine. No acute osseous pathology identified. IMPRESSION: Cardiomegaly with new congestive changes.  No focal consolidation. Electronically Signed   By: Anner Crete M.D.   On: 11/24/2016 22:55    Procedures Procedures (including critical care time)  Medications Ordered in ED Medications  doxycycline (VIBRA-TABS) tablet 100 mg (not administered)  acetaminophen (TYLENOL) tablet 650 mg (650 mg Oral Given 11/25/16 0136)     Initial Impression / Assessment and Plan / ED Course  I have reviewed the triage vital signs and the nursing notes.  Pertinent labs & imaging results that were available during my care of the patient were reviewed by me and considered in my medical decision making (see chart for details).  Clinical Course     Patient presents with vague complaints. Was noted to be febrile to 101.2. She is very minimal with history taking. She is on her home oxygen requirement. Chronically ill-appearing.  She also has chronic lower extremity erythema. No obvious worsening cellulitis. There is a urinary tract infection or pneumonia on chest x-ray. Lab work and lactate are reassuring. Patient has had a recent history of placement issues. She is cared for primarily by her brother  who called to say that he cannot take care of her. She was just recently discharged from a short stay in rehabilitation. Patient ambulates at her baseline but is two-person assist with transfers. Will have social work evaluate patient in the morning.  Discussed patient with family practice. Does not meet criteria for admission. We'll have social work evaluate. Patient was ordered doxycycline twice a day to cover for acute on chronic cellulitis given fever; however, no signs or symptoms of sepsis.  Final Clinical Impressions(s) / ED Diagnoses   Final diagnoses:  Muscular deconditioning  Fever, unspecified fever cause    New Prescriptions New Prescriptions   No medications on file   I personally performed the services described in this documentation, which was scribed in my presence. The recorded information has been reviewed and is accurate.    Kathleen Hacker, MD 11/25/16 [place], MD 11/25/16 [phone]

## 2016-11-24 NOTE — ED Notes (Signed)
Bed: [id number] Expected date:  Expected time:  Means of arrival:  Comments: EMS 55 yo female from home temp 101.7 130/76-generalized weakness

## 2016-11-25 DIAGNOSIS — R509 Fever, unspecified: Secondary | ICD-10-CM | POA: Diagnosis not present

## 2016-11-25 DIAGNOSIS — L0390 Cellulitis, unspecified: Secondary | ICD-10-CM | POA: Diagnosis not present

## 2016-11-25 DIAGNOSIS — J9611 Chronic respiratory failure with hypoxia: Secondary | ICD-10-CM | POA: Diagnosis not present

## 2016-11-25 LAB — URINALYSIS, ROUTINE W REFLEX MICROSCOPIC
Bilirubin Urine: NEGATIVE
GLUCOSE, UA: NEGATIVE mg/dL
HGB URINE DIPSTICK: NEGATIVE
KETONES UR: NEGATIVE mg/dL
Leukocytes, UA: NEGATIVE
Nitrite: NEGATIVE
PROTEIN: NEGATIVE mg/dL
Specific Gravity, Urine: 1.012 (ref 1.005–1.030)
pH: 5 (ref 5.0–8.0)

## 2016-11-25 MED ORDER — ATORVASTATIN CALCIUM 10 MG PO TABS
10.0000 mg | ORAL_TABLET | Freq: Every day | ORAL | Status: DC
  Filled 2016-11-25: qty 1

## 2016-11-25 MED ORDER — VENLAFAXINE HCL ER 75 MG PO CP24
300.0000 mg | ORAL_CAPSULE | Freq: Every day | ORAL | Status: DC
  Administered 2016-11-25: 300 mg via ORAL
  Filled 2016-11-25: qty 4

## 2016-11-25 MED ORDER — DOXYCYCLINE HYCLATE 100 MG PO TABS
100.0000 mg | ORAL_TABLET | Freq: Two times a day (BID) | ORAL | Status: DC
  Administered 2016-11-25: 100 mg via ORAL
  Filled 2016-11-25: qty 1

## 2016-11-25 MED ORDER — GLIPIZIDE 5 MG PO TABS
5.0000 mg | ORAL_TABLET | Freq: Every day | ORAL | Status: DC
  Administered 2016-11-25: 5 mg via ORAL
  Filled 2016-11-25 (×2): qty 1

## 2016-11-25 MED ORDER — ACETAMINOPHEN 325 MG PO TABS
650.0000 mg | ORAL_TABLET | Freq: Once | ORAL | Status: AC
  Administered 2016-11-25: 650 mg via ORAL
  Filled 2016-11-25: qty 2

## 2016-11-25 MED ORDER — POTASSIUM CHLORIDE CRYS ER 20 MEQ PO TBCR
40.0000 meq | EXTENDED_RELEASE_TABLET | Freq: Every day | ORAL | Status: DC
  Administered 2016-11-25: 40 meq via ORAL
  Filled 2016-11-25: qty 2

## 2016-11-25 MED ORDER — PREGABALIN 50 MG PO CAPS
150.0000 mg | ORAL_CAPSULE | Freq: Two times a day (BID) | ORAL | Status: DC
  Administered 2016-11-25: 150 mg via ORAL
  Filled 2016-11-25: qty 3

## 2016-11-25 MED ORDER — ALBUTEROL SULFATE HFA 108 (90 BASE) MCG/ACT IN AERS: 2.0000 | INHALATION_SPRAY | RESPIRATORY_TRACT | Status: DC | PRN

## 2016-11-25 MED ORDER — FLUTICASONE FUROATE-VILANTEROL 100-25 MCG/INH IN AEPB
1.0000 | INHALATION_SPRAY | Freq: Every day | RESPIRATORY_TRACT | Status: DC
  Filled 2016-11-25: qty 28

## 2016-11-25 MED ORDER — ARIPIPRAZOLE 2 MG PO TABS
2.0000 mg | ORAL_TABLET | ORAL | Status: DC
  Administered 2016-11-25: 2 mg via ORAL
  Filled 2016-11-25: qty 1

## 2016-11-25 NOTE — Progress Notes (Signed)
ED CM and ED SW reviewed pt d/c plans

## 2016-11-25 NOTE — Progress Notes (Signed)
ED CM spoke with [facility] to confirms referral has been received and [facility] Cm is reviewing pt, aware of ED visit and will engage with pt CM discussed EDP consult to ED SW for d/c plans and no admission Discussion of possible need for palliative care goals of care evaluation

## 2016-11-25 NOTE — Progress Notes (Signed)
CM reviewed ED CM assessment and concerns for d/c with EDP CM reviewed advanced home care services, [facility] services availability EDP states pt is medically clear for d/c

## 2016-11-25 NOTE — ED Provider Notes (Signed)
09:00- Education officer, museum saw the patient stated there was no potential for placement at this time, and that this was related to her financial status.  12:00-  care management has not made a decision yet, on  care plan options. At this time, the patient states that she needs to have a bowel movement, is shaking, and is unwilling to answer other questions.  13:15- nurse case manager is concerned that the patient is "not medically cleared yet". She noticed that the patient's oxygen saturations were low when she is off her oxygen. The patient is supposed to be on oxygen by nasal cannula 24/7. I asked nursing to replace the oxygen. Patient had a blood gas at 23:35 yesterday, which was reassuring.  Medications  doxycycline (VIBRA-TABS) tablet 100 mg (100 mg Oral Given 11/25/16 0822)  acetaminophen (TYLENOL) tablet 650 mg (650 mg Oral Given 11/25/16 0136)    Patient Vitals for the past 24 hrs:  BP Temp Temp src Pulse Resp SpO2 Weight  11/25/16 1336 103/61 - - 95 18 94 % -  11/25/16 1118 134/63 - - 99 18 92 % -  11/25/16 0935 139/64 - - 101 18 (!) 88 % -  11/25/16 0723 (!) 113/53 - - 99 20 93 % -  11/25/16 0600 110/61 - - 99 - 91 % -  11/25/16 0530 103/58 - - 99 - 95 % -  11/25/16 0517 119/76 - - 99 22 93 % -  11/25/16 0500 119/76 - - 99 - 93 % -  11/25/16 0430 103/63 - - 96 20 94 % -  11/25/16 0400 105/57 - - 95 - 95 % -  11/25/16 0300 (!) 108/52 - - 94 - 95 % -  11/25/16 0215 - - - 90 - 98 % -  11/25/16 0200 109/57 - - 88 - 99 % -  11/25/16 0137 123/58 98.2 F (36.8 C) Oral 88 22 99 % -  11/25/16 0130 123/58 - - 88 - 99 % -  11/25/16 0045 - - - 86 20 92 % -  11/25/16 0000 117/55 - - 94 19 - -  11/24/16 2230 116/78 - - 95 25 96 % -  11/24/16 2212 - - - - - - (!) 400 lb (181.4 kg)  11/24/16 2200 103/65 101.2 F (38.4 C) Rectal 96 26 92 % -    3:24 PM Reevaluation with update and discussion. After initial assessment and treatment, an updated evaluation reveals No change in clinical status. At  this time, we are awaiting physical therapy evaluation, and possible placement, as suggested by social work. Vertis Kelch, MD 11/27/16 2109

## 2016-11-25 NOTE — Progress Notes (Signed)
ED Cm spoke with pt to update her on availability of home health and [facility] services Pt still wanting to go home CM again voiced concern with her and brother being in home and difficulty in being care givers to each other

## 2016-11-25 NOTE — NC FL2 (Signed)
[place] LEVEL OF CARE SCREENING TOOL     IDENTIFICATION  [name] Birthdate: 02-03-1961 Sex: female Admission Date (Current Location): 11/24/2016  [facility] and [place] Number:  Herbalist and Address:  [facility],  [address] [place], [place]      Provider Number: [id number]  Attending Physician Name and Address:  Daleen Bo, MD  Relative Name and Phone Number:   Kaddie Liotta, brother [phone] and [phone])    Current Level of Care: Hospital Recommended Level of Care: [facility] Prior Approval Number:    Date Approved/Denied:   PASRR Number:  ([id number])  Discharge Plan: SNF    Current Diagnoses: Patient Active Problem List   Diagnosis Date Noted  . Cough   . Insomnia   . Palliative care by specialist   . Advance care planning   . Chronic pain of both knees   . Acute respiratory failure with hypoxia and hypercarbia ([place]) 10/15/2016  . General weakness   . Hypoxia 10/14/2016  . Bacteremia   . Cellulitis of right lower extremity 06/04/2016  . Type 2 diabetes mellitus ([place]) 02/14/2016  . Pressure ulcer 01/16/2016  . Acute on chronic respiratory failure with hypercapnia ([place])   . Chronic diastolic congestive heart failure ([place])   . Essential hypertension   . CAP (community acquired pneumonia) 01/15/2016  . Bilateral knee pain 03/22/2015  . OSA (obstructive sleep apnea)   . Obesity hypoventilation syndrome ([place])   . Chronic respiratory failure with hypoxia ([place])   . Assistance needed for grooming 11/08/2014  . Fatigue 11/03/2014  . Memory loss 03/18/2014  . Tremor 03/18/2014  . Vulvovaginal candidiasis 11/25/2013  . Restrictive airway disease 08/26/2013  . Hypokalemia 08/19/2013  . Health care maintenance 06/17/2013  . Venous stasis dermatitis of both lower extremities 12/02/2012  . COPD (chronic obstructive pulmonary disease) ([place]) 10/15/2011  . Peripheral neuropathy ([place])  10/15/2011  . KNEE PAIN, BILATERAL 01/22/2011  . BACK PAIN, LUMBAR, CHRONIC 01/22/2011  . MIGRAINE HEADACHE 10/25/2010  . HYPERLIPIDEMIA 04/07/2009  . EDEMA, CHRONIC 06/27/2008  . Morbid obesity ([place]) 11/16/2007  . DEPRESSION, MAJOR, RECURRENT 02/12/2007  . PANIC ATTACKS 02/12/2007  . HYPERTENSION, BENIGN SYSTEMIC 02/12/2007    Orientation RESPIRATION BLADDER Height & Weight     Self, Time, Situation, Place  Other (Comment) (Patient on 4 liters of oxygen) Continent Weight: (!) 400 lb (181.4 kg) Height:     BEHAVIORAL SYMPTOMS/MOOD NEUROLOGICAL BOWEL NUTRITION STATUS      Continent Diet  AMBULATORY STATUS COMMUNICATION OF NEEDS Skin   Extensive Assist Verbally Other (Comment) (Patient has cellulitis in legs)                       Personal Care Assistance Level of Assistance  Bathing, Feeding, Dressing Bathing Assistance: Maximum assistance Feeding assistance: Independent Dressing Assistance: Maximum assistance     Functional Limitations Info  Sight, Hearing, Speech Sight Info: Adequate Hearing Info: Adequate Speech Info: Adequate    SPECIAL CARE FACTORS FREQUENCY                       Contractures      Additional Factors Info  Code Status (Prior)   Allergies Info:  (Aspirin)           Current Medications (11/25/2016):  This is the current hospital active medication list Current Facility-Administered Medications  Medication Dose Route Frequency Provider Last Rate Last Dose  .  doxycycline (VIBRA-TABS) tablet 100 mg  100 mg Oral Q12H [name], MD   100 mg at 11/25/16 [id number]   Current Outpatient Prescriptions  Medication Sig Dispense Refill  . albuterol (PROVENTIL HFA;VENTOLIN HFA) 108 (90 Base) MCG/ACT inhaler Inhale 2 puffs into the lungs every 4 (four) hours as needed for wheezing or shortness of breath. 1 Inhaler 2  . ARIPiprazole (ABILIFY) 2 MG tablet take 1 tablet by mouth every other day (Patient taking differently: Take 2 mg by mouth  every other day) 45 tablet 0  . Ascorbic Acid (VITAMIN C) 1000 MG tablet Take 3,000 mg by mouth at bedtime.     Marland Kitchen atorvastatin (LIPITOR) 10 MG tablet Take 10 mg by mouth at bedtime.    . butalbital-acetaminophen-caffeine (FIORICET, ESGIC) 50-325-40 MG tablet Take 1 tablet by mouth every 4 (four) hours as needed for headache or migraine.    . cetirizine (ZYRTEC) 10 MG tablet Take 10 mg by mouth daily.    . cyclobenzaprine (FLEXERIL) 10 MG tablet Take 1 tablet (10 mg total) by mouth 3 (three) times daily as needed for muscle spasms. 90 tablet 2  . fluticasone furoate-vilanterol (BREO ELLIPTA) 100-25 MCG/INH AEPB Inhale 1 puff into the lungs daily.    Marland Kitchen glipiZIDE (GLUCOTROL) 5 MG tablet take 1 tablet by mouth once daily (Patient taking differently: Take 5 mg by mouth every morning as needed for high blood sugar) 30 tablet 2  . hydrocerin (EUCERIN) CREA Apply 1 application topically 2 (two) times daily. 228 g 0  . meclizine (ANTIVERT) 25 MG tablet Take 25 mg by mouth every 6 (six) hours as needed for dizziness.    . nystatin (MYCOSTATIN) powder Apply topically 2 (two) times daily. Apply to affected skin BID (Patient taking differently: Apply topically 2 (two) times daily as needed. TO AFFECTED AREAS) 360 g 6  . Omega-3 Fatty Acids (FISH OIL) 1000 MG CAPS Take 2 capsules (2,000 mg total) by mouth at bedtime. 60 capsule 2  . oxyCODONE-acetaminophen (PERCOCET) 10-325 MG tablet Take 1 tablet by mouth every 6 (six) hours as needed for pain.     . polyethylene glycol (MIRALAX / GLYCOLAX) packet Take 17 g by mouth daily as needed for mild constipation.     . potassium chloride SA (K-DUR,KLOR-CON) 20 MEQ tablet Take 2 tablets (40 mEq total) by mouth daily. 180 tablet 0  . pregabalin (LYRICA) 150 MG capsule Take 1 capsule (150 mg total) by mouth 2 (two) times daily. 120 capsule 0  . Psyllium (EQ DAILY FIBER PO) Take 1 capsule by mouth daily.    Marland Kitchen torsemide (DEMADEX) 20 MG tablet Take 40 mg by mouth in the AM and  20 mg by mouth in the PM. (Patient taking differently: Take 40 mg by mouth 2 (two) times daily. ) 360 tablet 1  . traZODone (DESYREL) 150 MG tablet Take 2 tablets (300 mg total) by mouth at bedtime. 180 tablet 1  . venlafaxine XR (EFFEXOR-XR) 150 MG 24 hr capsule Take 2 capsules (300 mg total) by mouth daily. 180 capsule 0  . Oxycodone HCl 10 MG TABS Take 1 tablet (10 mg total) by mouth every 6 (six) hours as needed. (Patient not taking: Reported on 11/25/2016) 20 tablet 0  . OXYGEN Inhale into the lungs. 4l/min    . tiotropium (SPIRIVA) 18 MCG inhalation capsule Place 1 capsule (18 mcg total) into inhaler and inhale daily. (Patient not taking: Reported on 11/25/2016) 30 capsule 2     Discharge Medications:  Relevant Imaging Results:  Relevant Lab Results:   Additional Information  (SS# [id number])  Guadelupe Sabin, [name]

## 2016-11-25 NOTE — Progress Notes (Addendum)
   11/24/16 0000  CM Assessment  Expected Discharge [place]  In-house Referral Clinical Social Work  Discharge Planning Services CM Consult  [facility]  Choice offered to / list presented to  Patient  DME Arranged N/A;Oxygen  DME Bruno RN;Disease Management;PT;OT;Nurse's Aide;Respirator Therapy;Social Work  [place] Completed, signed off  Discharge [place]   ED CM consulted by ED SW after she was consulted by EDP Eulis Foster and completed her assessment and review of pt with SW Engineer, site.   CM spoke with pt in her room but pt noted without oxygen nasal cannula in nare Pt tells Cm she has memory issues and she states when questioned that this is not baseline for her and she began to note worsening concern with memory and "train of thought", "starting last night" Pt with fluttering of eyes with attempts to concentrate and respond to CM questions but with moderate difficulty With assist of ED RN pt oximetry checked at 90 % and increased to 93% Pt able to tell Cm after long breaks in statements that it is "December" Two thousand seventeen" and "monday"  "I have been in hospital since last night but do not know if they are going to admit me or not"   Pt assessed for home health service Pt states her anticipated goal is d/c home with her brother who is her sole primary caregiver and is scheduled to start chemo/radiation on "thursday" and she is not sure how much he will be able to assist her after Thursday Pt has been home for "one week" from facility Pt confirms "I left" " my choice" and not set up with home services by facility but "[facility]" and [facility] is pt [facility] of home health agency Pt is bed bound and at baseline uses per pt a walker Pt states she has a standard and bariatric walker, bedside commode, hospital bed  Reports at this time "my brother uses my bed" Pt states to Cm her brother helps her to sit up in bed and she gets up with walker and goes to "the kitchen" Pt needed CM assist to get cup of water off bedside table, unable to reposition herself in ED bed without CM lifting her shoulders or legs. Bilateral edema of legs with right leg with scaling of skin and pt with c/o pain of both knees prior to repositioned Brother called pt on ED phone while CM present and Mr [place] states he would continue to be pt primary caregiver even after he begins his treatment Cm reviewed with pt differences in home health and private duty nursing related length of stay in her home and financial responsibility. CM voiced her concern to pt about Cm concern with her brother not being able to care for her at home if he is attempting to care for himself Pt states that [facility] family medicine SW was assisting her with services CM reviewed [place] note with pt to updated her that SW referred her to [facility] on 11/20/16 -pending  ED CM and ED SW reviewed concerns ? APS  1217 ED CM contacted [facility] for [facility] for [facility] for disease management, PT, OT, Resp, Aide and SW  1253 CM spoke again with Manuela Schwartz of [facility] to have her review pt and to get more collatoral

## 2016-11-25 NOTE — Progress Notes (Signed)
Staffed case with Asst. Director of Social Work.  Spoke with ED CM who states she has services in place for home health for patient. ED CM states she will speak with EDP regarding patient's disposition.    Merry Proud, LCSWA Clinical Social Worker [phone] 3:17 PM

## 2016-11-25 NOTE — Progress Notes (Signed)
Attempts to reach EDP

## 2016-11-25 NOTE — ED Notes (Signed)
Pt able to ambulate with walker but it takes her a very long time to take steps.  Pt unable to sit up on bed or move her legs down on floor.  Pt also unable to stand on her own----  two-person assist was utilized with sitting and standing.

## 2016-11-25 NOTE — Consult Note (Signed)
   [facility] CM Inpatient Consult   11/25/2016  Kathleen Roy Mar 19, 1961 [id number]    Made aware of ED admission by [facility]. Also received call from ED [facility] regarding [place] [facility] follow up. ED RNCM indicates [name] is currently refusing SNF placement. She is likely to discharge from the ED back to home. She lives with her brother who is of limited support. Of note, ED RNCM indicates [name] left SNF AMA last week. She does not have Medicaid. ED RNCM indicates [name] will have maximized home health services. Also discussed how [name] could benefit from goals of care as well. Made ED RNCM aware that referral was received and [name] will have follow up from [place] [facility] program services.  Will update [facility] RNCM Community RNCM about conversation with ED RNCM. Will also request for [name] to be assigned [place].  [name], MSN-Ed, RN,BSN [facility] Liaison [phone]

## 2016-11-25 NOTE — Progress Notes (Addendum)
Spoke with Manuela Schwartz of [facility] who will initiate services for pt prn

## 2016-11-25 NOTE — Progress Notes (Addendum)
Staffed with Asst. Director of Social Work  Staffed with EDP and informed him patient not having Medicaid and being unable to private pay. Informed him would speak with ED CM to see if patient is eligible for any home health services.   CSW spoke with patient at bedside this AM with no family present. Patient stated she has been in three facilities within the last three months. Patient stated she does not have Medicaid and she would not be able to pay privately or make copayments to go into a facility. CSW noted from [place] note that patient was previously accepted at [facility]. CSW asked patient why she was no longer at [facility] since she was just placed on 11/12/16. Patient stated she left AMA on her own and she was only there for one week. Patient stated "I did not like the way therapy was going and the meals were not so good". Patient stated she had previous services with [place] for home health services. (Notes reviewed from [place] when patient was at [facility] 11/12/16).  Staffed with ED CM and she stated she would see patient.  Call made to Asst. Director of Social Work with message left for return call.  Spoke with [place] with [place] who states she will speak with her DON at the facility and call back regarding a decision on a bed offer.   Staffed with EDP to place a PT consult and was made aware that a facility [facility]) is looking at patients information, however, patient does NOT have a bed offer anywhere at this time. EDP stated he would put in the PT consult. CSW waiting to here back from [facility].    Merry Proud, LCSWA Clinical Social Worker [phone] 11:12 AM

## 2016-11-25 NOTE — Evaluation (Addendum)
Physical Therapy Evaluation Patient Details Name: Kathleen Roy MRN: [id number] DOB: 11/07/1961 Today's Date: 11/25/2016   History of Present Illness  Pt admitted from home recent DC from SNF about 12/1 with fever and weakness.On Home Oxygen. Marland Kitchen PMH: morbid obesity, chronic LE cellulitis, foot ulcer, depression, fibromyalgia, headaches, neuropathy, HTN, CHF, COPD.   Clinical Impression  Difficult to evaluate mobility with patient on the gurney which is high placing patient at risk for not getting back onto the  [place] after standing. The patient reports minimal ambulation with RW at home. She has recently been in a SNF. Pt admitted with above diagnosis. Pt currently with functional limitations due to the deficits listed below (see PT Problem List).  Pt will benefit from skilled PT to increase their independence and safety with mobility to allow discharge to the venue listed below.        Follow Up Recommendations SNF;Supervision/Assistance - 24 hour (recently in SNF, not elligible per notes.)   HHPT, ? WC can be beneficial   Equipment Recommendations  None recommended by PT    Recommendations for Other Services       Precautions / Restrictions Precautions Precautions: Fall Precaution Comments: morbidly obeses      Mobility  Bed Mobility Overal bed mobility: Needs Assistance Bed Mobility: Rolling Rolling: Total assist;+2 for safety/equipment;+2 for physical assistance         General bed mobility comments: due to body habitus and the gurney is tight against  her arms. there is no room to roll   Transfers                 General transfer comment: not tested due to body  habitus and  the height of the gurney is such that the patients feet would not touch floor.  Ambulation/Gait                Stairs            Wheelchair Mobility    Modified Rankin (Stroke Patients Only)       Balance                                              Pertinent Vitals/Pain Pain Assessment: Faces Faces Pain Scale: Hurts little more Pain Location: legs when moved. left shoulder Pain Descriptors / Indicators: Aching Pain Intervention(s): Monitored during session;Repositioned    Home Living Family/patient expects to be discharged to:: Private residence Living Arrangements: Other relatives Available Help at Discharge: Family;Available 24 hours/day Type of Home: House Home Access: Stairs to enter Entrance Stairs-Rails: Right;Left   Home Layout: One level Home Equipment: Environmental consultant - 4 wheels;Bedside commode      Prior Function Level of Independence: Needs assistance   Gait / Transfers Assistance Needed: ambulating with walker only a few feet     Comments: Pt can only leave her home with ambulance transportation. Brother who has terminal cancer and to start teatments this week is caregiver.     Hand Dominance        Extremity/Trunk Assessment   Upper Extremity Assessment: LUE deficits/detail       LUE Deficits / Details: painful to raise the arm to shoulder height.   Lower Extremity Assessment: RLE deficits/detail;LLE deficits/detail RLE Deficits / Details: barely raises the legs from bed, reddness and changes in skin texture.  LLE Deficits / Details: skin changes  are worse, unable to raise the leg from bed > 2 inches.     Communication   Communication: No difficulties  Cognition Arousal/Alertness: Awake/alert Behavior During Therapy: Anxious Overall Cognitive Status: Within Functional Limits for tasks assessed                 General Comments: emotional at times. States that she has not had anything to eat.    General Comments      Exercises     Assessment/Plan    PT Assessment Patient needs continued PT services  PT Problem List Decreased strength;Decreased range of motion;Decreased activity tolerance;Decreased mobility;Cardiopulmonary status limiting activity;Pain;Decreased knowledge of  precautions;Decreased safety awareness;Decreased skin integrity;Obesity          PT Treatment Interventions DME instruction;Gait training;Functional mobility training;Therapeutic activities;Therapeutic exercise;Patient/family education    PT Goals (Current goals can be found in the Care Plan section)  Acute Rehab PT Goals Patient Stated Goal: to go home, get something to eat PT Goal Formulation: With patient Time For Goal Achievement: 12/09/16 Potential to Achieve Goals: Fair    Frequency Min 3X/week   Barriers to discharge Decreased caregiver support      Co-evaluation               End of Session   Activity Tolerance: Patient limited by fatigue Patient left: in bed;with call bell/phone within reach Nurse Communication: Mobility status;Need for lift equipment    Functional Assessment Tool Used: clinical judgement Functional Limitation: Mobility: Walking and moving around Mobility: Walking and Moving Around Current Status [id number]): 100 percent impaired, limited or restricted Mobility: Walking and Moving Around Goal Status [phone]): At least 1 percent but less than 20 percent impaired, limited or restricted    Time: 1430-1453 PT Time Calculation (min) (ACUTE ONLY): 23 min   Charges:   PT Evaluation $PT Eval Moderate Complexity: 1 Procedure PT Treatments $Therapeutic Exercise: 8-22 mins   PT G Codes:   PT G-Codes **NOT FOR INPATIENT CLASS** Functional Assessment Tool Used: clinical judgement Functional Limitation: Mobility: Walking and moving around Mobility: Walking and Moving Around Current Status [id number]): 100 percent impaired, limited or restricted Mobility: Walking and Moving Around Goal Status [id number]): At least 1 percent but less than 20 percent impaired, limited or restricted    Claretha Cooper 11/25/2016, 4:54 PM  Tresa Endo PT [phone]

## 2016-11-26 ENCOUNTER — Telehealth: Payer: Self-pay | Admitting: *Deleted

## 2016-11-26 ENCOUNTER — Encounter (HOSPITAL_COMMUNITY): Payer: Self-pay

## 2016-11-26 ENCOUNTER — Observation Stay (HOSPITAL_COMMUNITY)
Admission: EM | Admit: 2016-11-26 | Discharge: 2016-11-27 | Disposition: A | Discharge status: Home / Self Care | Payer: Medicare Other | Attending: Family Medicine | Admitting: Family Medicine

## 2016-11-26 ENCOUNTER — Telehealth: Payer: Self-pay | Admitting: Family Medicine

## 2016-11-26 ENCOUNTER — Other Ambulatory Visit: Payer: Self-pay | Admitting: *Deleted

## 2016-11-26 ENCOUNTER — Emergency Department (HOSPITAL_COMMUNITY): Payer: Medicare Other

## 2016-11-26 DIAGNOSIS — R531 Weakness: Principal | ICD-10-CM | POA: Insufficient documentation

## 2016-11-26 DIAGNOSIS — F339 Major depressive disorder, recurrent, unspecified: Secondary | ICD-10-CM | POA: Insufficient documentation

## 2016-11-26 DIAGNOSIS — R509 Fever, unspecified: Secondary | ICD-10-CM

## 2016-11-26 DIAGNOSIS — R0602 Shortness of breath: Secondary | ICD-10-CM | POA: Diagnosis not present

## 2016-11-26 DIAGNOSIS — Z7984 Long term (current) use of oral hypoglycemic drugs: Secondary | ICD-10-CM | POA: Insufficient documentation

## 2016-11-26 DIAGNOSIS — R404 Transient alteration of awareness: Secondary | ICD-10-CM | POA: Diagnosis not present

## 2016-11-26 DIAGNOSIS — Z79899 Other long term (current) drug therapy: Secondary | ICD-10-CM | POA: Insufficient documentation

## 2016-11-26 DIAGNOSIS — G629 Polyneuropathy, unspecified: Secondary | ICD-10-CM | POA: Insufficient documentation

## 2016-11-26 DIAGNOSIS — I5032 Chronic diastolic (congestive) heart failure: Secondary | ICD-10-CM | POA: Insufficient documentation

## 2016-11-26 DIAGNOSIS — F410 Panic disorder [episodic paroxysmal anxiety] without agoraphobia: Secondary | ICD-10-CM | POA: Insufficient documentation

## 2016-11-26 DIAGNOSIS — E785 Hyperlipidemia, unspecified: Secondary | ICD-10-CM | POA: Insufficient documentation

## 2016-11-26 DIAGNOSIS — R52 Pain, unspecified: Secondary | ICD-10-CM | POA: Diagnosis not present

## 2016-11-26 DIAGNOSIS — E662 Morbid (severe) obesity with alveolar hypoventilation: Secondary | ICD-10-CM | POA: Insufficient documentation

## 2016-11-26 DIAGNOSIS — I110 Hypertensive heart disease with heart failure: Secondary | ICD-10-CM | POA: Insufficient documentation

## 2016-11-26 DIAGNOSIS — J449 Chronic obstructive pulmonary disease, unspecified: Secondary | ICD-10-CM | POA: Insufficient documentation

## 2016-11-26 DIAGNOSIS — L03113 Cellulitis of right upper limb: Secondary | ICD-10-CM | POA: Diagnosis not present

## 2016-11-26 DIAGNOSIS — L0390 Cellulitis, unspecified: Secondary | ICD-10-CM | POA: Diagnosis present

## 2016-11-26 DIAGNOSIS — Z6841 Body Mass Index (BMI) 40.0 and over, adult: Secondary | ICD-10-CM | POA: Insufficient documentation

## 2016-11-26 DIAGNOSIS — Z66 Do not resuscitate: Secondary | ICD-10-CM | POA: Insufficient documentation

## 2016-11-26 DIAGNOSIS — R7303 Prediabetes: Secondary | ICD-10-CM | POA: Insufficient documentation

## 2016-11-26 DIAGNOSIS — L03115 Cellulitis of right lower limb: Secondary | ICD-10-CM | POA: Insufficient documentation

## 2016-11-26 LAB — URINALYSIS, ROUTINE W REFLEX MICROSCOPIC
Bilirubin Urine: NEGATIVE
GLUCOSE, UA: NEGATIVE mg/dL
HGB URINE DIPSTICK: NEGATIVE
Ketones, ur: NEGATIVE mg/dL
Nitrite: NEGATIVE
PH: 5 (ref 5.0–8.0)
Protein, ur: 30 mg/dL — AB
Specific Gravity, Urine: 1.015 (ref 1.005–1.030)

## 2016-11-26 LAB — CBC WITH DIFFERENTIAL/PLATELET
BASOS ABS: 0 10*3/uL (ref 0.0–0.1)
Basophils Relative: 0 %
EOS PCT: 0 %
Eosinophils Absolute: 0 10*3/uL (ref 0.0–0.7)
HEMATOCRIT: 31.5 % — AB (ref 36.0–46.0)
Hemoglobin: 9.4 g/dL — ABNORMAL LOW (ref 12.0–15.0)
LYMPHS ABS: 0.8 10*3/uL (ref 0.7–4.0)
LYMPHS PCT: 9 %
MCH: 28.7 pg (ref 26.0–34.0)
MCHC: 29.8 g/dL — ABNORMAL LOW (ref 30.0–36.0)
MCV: 96 fL (ref 78.0–100.0)
MONO ABS: 0.4 10*3/uL (ref 0.1–1.0)
Monocytes Relative: 4 %
NEUTROS ABS: 8.2 10*3/uL — AB (ref 1.7–7.7)
Neutrophils Relative %: 87 %
PLATELETS: 191 10*3/uL (ref 150–400)
RBC: 3.28 MIL/uL — ABNORMAL LOW (ref 3.87–5.11)
RDW: 16.9 % — AB (ref 11.5–15.5)
WBC: 9.4 10*3/uL (ref 4.0–10.5)

## 2016-11-26 LAB — I-STAT VENOUS BLOOD GAS, ED
Acid-Base Excess: 19 mmol/L — ABNORMAL HIGH (ref 0.0–2.0)
BICARBONATE: 46.3 mmol/L — AB (ref 20.0–28.0)
O2 SAT: 93 %
PCO2 VEN: 65.8 mmHg — AB (ref 44.0–60.0)
PO2 VEN: 67 mmHg — AB (ref 32.0–45.0)
TCO2: 48 mmol/L (ref 0–100)
pH, Ven: 7.455 — ABNORMAL HIGH (ref 7.250–7.430)

## 2016-11-26 LAB — COMPREHENSIVE METABOLIC PANEL
ALT: 25 U/L (ref 14–54)
AST: 33 U/L (ref 15–41)
Albumin: 3.2 g/dL — ABNORMAL LOW (ref 3.5–5.0)
Alkaline Phosphatase: 73 U/L (ref 38–126)
Anion gap: 10 (ref 5–15)
BILIRUBIN TOTAL: 0.7 mg/dL (ref 0.3–1.2)
BUN: 13 mg/dL (ref 6–20)
CO2: 39 mmol/L — ABNORMAL HIGH (ref 22–32)
CREATININE: 0.99 mg/dL (ref 0.44–1.00)
Calcium: 9.5 mg/dL (ref 8.9–10.3)
Chloride: 93 mmol/L — ABNORMAL LOW (ref 101–111)
Glucose, Bld: 108 mg/dL — ABNORMAL HIGH (ref 65–99)
POTASSIUM: 3.8 mmol/L (ref 3.5–5.1)
Sodium: 142 mmol/L (ref 135–145)
TOTAL PROTEIN: 7.2 g/dL (ref 6.5–8.1)

## 2016-11-26 LAB — I-STAT CG4 LACTIC ACID, ED: Lactic Acid, Venous: 1.35 mmol/L (ref 0.5–1.9)

## 2016-11-26 LAB — TSH: TSH: 0.439 u[IU]/mL (ref 0.350–4.500)

## 2016-11-26 LAB — URINE CULTURE: Culture: NO GROWTH

## 2016-11-26 LAB — GLUCOSE, CAPILLARY: Glucose-Capillary: 129 mg/dL — ABNORMAL HIGH (ref 65–99)

## 2016-11-26 MED ORDER — TRAZODONE HCL 150 MG PO TABS
300.0000 mg | ORAL_TABLET | Freq: Every day | ORAL | Status: DC
  Administered 2016-11-26: 300 mg via ORAL
  Filled 2016-11-26: qty 2

## 2016-11-26 MED ORDER — VENLAFAXINE HCL ER 75 MG PO CP24
300.0000 mg | ORAL_CAPSULE | Freq: Every day | ORAL | Status: DC
  Administered 2016-11-27: 300 mg via ORAL
  Filled 2016-11-26 (×2): qty 4

## 2016-11-26 MED ORDER — ENOXAPARIN SODIUM 40 MG/0.4ML ~~LOC~~ SOLN
40.0000 mg | SUBCUTANEOUS | Status: DC
  Administered 2016-11-26: 40 mg via SUBCUTANEOUS
  Filled 2016-11-26: qty 0.4

## 2016-11-26 MED ORDER — OXYCODONE-ACETAMINOPHEN 5-325 MG PO TABS
1.0000 | ORAL_TABLET | ORAL | Status: DC | PRN
  Administered 2016-11-27 (×3): 1 via ORAL
  Filled 2016-11-26 (×3): qty 1

## 2016-11-26 MED ORDER — CEFAZOLIN IN D5W 1 GM/50ML IV SOLN
1.0000 g | Freq: Once | INTRAVENOUS | Status: AC
  Administered 2016-11-26: 1 g via INTRAVENOUS
  Filled 2016-11-26: qty 50

## 2016-11-26 MED ORDER — ARIPIPRAZOLE 2 MG PO TABS
2.0000 mg | ORAL_TABLET | ORAL | Status: DC
  Administered 2016-11-27: 2 mg via ORAL
  Filled 2016-11-26: qty 1

## 2016-11-26 MED ORDER — PREGABALIN 75 MG PO CAPS
150.0000 mg | ORAL_CAPSULE | Freq: Two times a day (BID) | ORAL | Status: DC
  Administered 2016-11-26 – 2016-11-27 (×2): 150 mg via ORAL
  Filled 2016-11-26 (×2): qty 2

## 2016-11-26 MED ORDER — OXYCODONE HCL 5 MG PO TABS
5.0000 mg | ORAL_TABLET | ORAL | Status: DC | PRN
  Administered 2016-11-27 (×3): 5 mg via ORAL
  Filled 2016-11-26 (×3): qty 1

## 2016-11-26 MED ORDER — ATORVASTATIN CALCIUM 10 MG PO TABS
10.0000 mg | ORAL_TABLET | Freq: Every day | ORAL | Status: DC
Start: 2016-11-26 — End: 2016-11-27
  Administered 2016-11-26: 10 mg via ORAL
  Filled 2016-11-26: qty 1

## 2016-11-26 MED ORDER — ALBUTEROL SULFATE (2.5 MG/3ML) 0.083% IN NEBU: 2.5000 mg | INHALATION_SOLUTION | RESPIRATORY_TRACT | Status: DC | PRN

## 2016-11-26 MED ORDER — OXYCODONE-ACETAMINOPHEN 10-325 MG PO TABS: 1.0000 | ORAL_TABLET | ORAL | Status: DC | PRN

## 2016-11-26 MED ORDER — TORSEMIDE 20 MG PO TABS
40.0000 mg | ORAL_TABLET | Freq: Two times a day (BID) | ORAL | Status: DC
Start: 2016-11-26 — End: 2016-11-27
  Administered 2016-11-27: 40 mg via ORAL
  Filled 2016-11-26 (×2): qty 2

## 2016-11-26 MED ORDER — DEXTROSE 5 % IV SOLN
2.0000 g | INTRAVENOUS | Status: DC
  Administered 2016-11-26: 2 g via INTRAVENOUS
  Filled 2016-11-26 (×2): qty 2

## 2016-11-26 NOTE — ED Notes (Signed)
bari bed ordered

## 2016-11-26 NOTE — ED Notes (Addendum)
Pt given a coca cola, a diet coke, peanut butter, and graham crackers, per Steffanie Dunn, RN.

## 2016-11-26 NOTE — ED Provider Notes (Signed)
[facility] DEPT Provider Note   CSN: [id number] Arrival date & time: 11/26/16  1301     History   Chief Complaint Chief Complaint  Patient presents with  . Weakness    HPI DARNELLA ZINMAN is a 55 y.o. female who presents with generalized weakness. 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 Brother is at bedside and helps provide history. She reports decreased appetite and worsening generalized weakness over the past several days. She was seen in the ED yesterday for similar complaints. She did have a temp of 101.2 but otherwise work up was unremarkable and she was sent home. Today her brother was trying to help her up from her bed and she was so weak and somnolent that she slid on to the floor. EMS was called since her brother was unable to get her up and she was transported to the ED for further evaluation. She has chronic cellulitis of the RLE however the redness and warmth has extended higher than normal. Her brother is unable to care for her however the patient also refuses to go to a SNF unless it is close by. She has been evaluated by SW and CM who have set up home health services for her as an outpatient since she will go to a SNF but then not pay and will be discharged.  HPI  Past Medical History:  Diagnosis Date  . Arthritis    "I feel like it's everywhere" (11/08/2014)  . Cellulitis and abscess of lower extremity 08/16/2016   right leg  . Chronic lower back pain   . COPD (chronic obstructive pulmonary disease) ([place])   . Depression   . Diabetes mellitus without complication ([place])    type 2   . Fibromyalgia   . Headache    "at least weekly; it's usually when I first wake up" (11/08/2014)  . Hyperlipidemia   . Hypertension   . HYPERTENSION, BENIGN SYSTEMIC 02/12/2007  . Memory disturbance   . Migraines 1970's - <2000   "they just went away"    . Neuropathy ([place])   . Normal echocardiogram 05/30/05   suboptimal study  . Obesity, morbid (more than 100 lbs over ideal weight or BMI > 40) (HCC)    obese since childhood  . Osteoarthritis   . Peripheral edema   . Pressure ulcer of foot    left  . Sepsis ([place])   . Shortness of breath dyspnea   . Sleep apnea    "recently dx'd; haven't got my equipment yet" (11/08/2014)    Patient Active Problem List   Diagnosis Date Noted  . Cough   . Insomnia   . Palliative care by specialist   . Advance care planning   . Chronic pain of both knees   . Acute respiratory failure with hypoxia and hypercarbia ([place]) 10/15/2016  . General weakness   . Hypoxia 10/14/2016  . Bacteremia   . Cellulitis of right lower extremity 06/04/2016  . Type 2 diabetes mellitus ([place]) 02/14/2016  . Pressure ulcer 01/16/2016  . Acute on chronic respiratory failure with hypercapnia ([place])   . Chronic diastolic congestive heart failure ([place])   . Essential hypertension   . CAP (community acquired pneumonia) 01/15/2016  . Bilateral knee pain 03/22/2015  . OSA (obstructive sleep apnea)   . Obesity hypoventilation syndrome ([place])   . Chronic respiratory failure with hypoxia ([place])   . Assistance  needed for grooming 11/08/2014  . Fatigue 11/03/2014  . Memory loss 03/18/2014  . Tremor 03/18/2014  . Vulvovaginal candidiasis 11/25/2013  . Restrictive airway disease 08/26/2013  . Hypokalemia 08/19/2013  . Health care maintenance 06/17/2013  . Venous stasis dermatitis of both lower extremities 12/02/2012  . COPD (chronic obstructive pulmonary disease) ([place]) 10/15/2011  . Peripheral neuropathy ([place]) 10/15/2011  . KNEE PAIN, BILATERAL 01/22/2011  . BACK PAIN, LUMBAR, CHRONIC 01/22/2011  . MIGRAINE HEADACHE 10/25/2010  . HYPERLIPIDEMIA 04/07/2009  . EDEMA, CHRONIC 06/27/2008  . Morbid obesity ([place]) 11/16/2007  . DEPRESSION, MAJOR, RECURRENT 02/12/2007  . PANIC ATTACKS 02/12/2007  . HYPERTENSION, BENIGN SYSTEMIC  02/12/2007    Past Surgical History:  Procedure Laterality Date  . ABDOMINAL HERNIA REPAIR  2002  . APPENDECTOMY  1995  . CHOLECYSTECTOMY  1995  . TOTAL ABDOMINAL HYSTERECTOMY  1994    OB History    Gravida Para Term Preterm AB Living   0 0 0 0 0 0   SAB TAB Ectopic Multiple Live Births   0 0 0 0         Home Medications    Prior to Admission medications   Medication Sig Start Date End Date Taking? Authorizing Provider  albuterol (PROVENTIL HFA;VENTOLIN HFA) 108 (90 Base) MCG/ACT inhaler Inhale 2 puffs into the lungs every 4 (four) hours as needed for wheezing or shortness of breath. 10/17/16  Yes Lovenia Kim, MD  ARIPiprazole (ABILIFY) 2 MG tablet take 1 tablet by mouth every other day Patient taking differently: Take 2 mg by mouth every other day 03/08/16  Yes Sela Hua, MD  Ascorbic Acid (VITAMIN C) 1000 MG tablet Take 3,000 mg by mouth at bedtime.    Yes Historical Provider, MD  atorvastatin (LIPITOR) 10 MG tablet Take 10 mg by mouth at bedtime.   Yes Historical Provider, MD  cetirizine (ZYRTEC) 10 MG tablet Take 10 mg by mouth daily.   Yes Historical Provider, MD  cyclobenzaprine (FLEXERIL) 10 MG tablet Take 1 tablet (10 mg total) by mouth 3 (three) times daily as needed for muscle spasms. 06/17/16  Yes Sela Hua, MD  fluticasone furoate-vilanterol (BREO ELLIPTA) 100-25 MCG/INH AEPB Inhale 1 puff into the lungs daily as needed (for SOB).    Yes Historical Provider, MD  meclizine (ANTIVERT) 25 MG tablet Take 25 mg by mouth every 6 (six) hours as needed for dizziness. 11/06/16  Yes Historical Provider, MD  nystatin (MYCOSTATIN) powder Apply topically 2 (two) times daily. Apply to affected skin BID Patient taking differently: Apply topically 2 (two) times daily as needed. TO AFFECTED AREAS 10/26/15  Yes [name], MD  Omega-3 Fatty Acids (FISH OIL) 1000 MG CAPS Take 2 capsules (2,000 mg total) by mouth at bedtime. 06/10/16  Yes Sela Hua, MD    oxyCODONE-acetaminophen (PERCOCET) 10-325 MG tablet Take 1 tablet by mouth every 6 (six) hours as needed for pain.    Yes Historical Provider, MD  OXYGEN Inhale into the lungs. 4l/min   Yes Historical Provider, MD  polyethylene glycol (MIRALAX / GLYCOLAX) packet Take 17 g by mouth daily as needed for mild constipation.    Yes Historical Provider, MD  potassium chloride SA (K-DUR,KLOR-CON) 20 MEQ tablet Take 2 tablets (40 mEq total) by mouth daily. 06/04/16  Yes Sela Hua, MD  pregabalin (LYRICA) 150 MG capsule Take 1 capsule (150 mg total) by mouth 2 (two) times daily. 08/22/16  Yes Angela C Riccio, DO  Psyllium (EQ DAILY FIBER  PO) Take 1 capsule by mouth daily.   Yes Historical Provider, MD  torsemide (DEMADEX) 20 MG tablet Take 40 mg by mouth in the AM and 20 mg by mouth in the PM. Patient taking differently: Take 40 mg by mouth 2 (two) times daily.  10/21/16  Yes Lovenia Kim, MD  traZODone (DESYREL) 150 MG tablet Take 2 tablets (300 mg total) by mouth at bedtime. 04/02/16  Yes Sela Hua, MD  venlafaxine XR (EFFEXOR-XR) 150 MG 24 hr capsule Take 2 capsules (300 mg total) by mouth daily. 08/07/16  Yes Sela Hua, MD  glipiZIDE (GLUCOTROL) 5 MG tablet take 1 tablet by mouth once daily Patient not taking: Reported on 11/26/2016 06/28/16   Sela Hua, MD  hydrocerin (EUCERIN) CREA Apply 1 application topically 2 (two) times daily. 08/22/16   Steve Rattler, DO  Oxycodone HCl 10 MG TABS Take 1 tablet (10 mg total) by mouth every 6 (six) hours as needed. Patient not taking: Reported on 11/25/2016 11/12/16   Davonna Belling, MD  tiotropium (SPIRIVA) 18 MCG inhalation capsule Place 1 capsule (18 mcg total) into inhaler and inhale daily. Patient not taking: Reported on 11/25/2016 10/18/16   Lovenia Kim, MD    Family History Family History  Problem Relation Age of Onset  . Stroke Mother   . Dementia Father   . Prostate cancer Father   . Congestive Heart Failure Brother   . Diabetes  Brother   . Hypertension Brother     Social History Social History  Substance Use Topics  . Smoking status: Former Smoker    Packs/day: 1.00    Years: 30.00    Types: Cigarettes    Quit date: 01/05/2009  . Smokeless tobacco: Never Used  . Alcohol use No     Allergies   Aspirin   Review of Systems Review of Systems  Constitutional: Positive for appetite change, chills and fatigue. Negative for fever.  Respiratory: Positive for shortness of breath. Negative for cough.   Cardiovascular: Negative for chest pain.  Gastrointestinal: Negative for abdominal pain, nausea and vomiting.  Genitourinary: Negative for dysuria.  Musculoskeletal: Positive for arthralgias.  Skin: Positive for color change. Negative for wound.  Neurological: Positive for weakness and headaches.  All other systems reviewed and are negative.    Physical Exam Updated Vital Signs BP 113/62   Pulse 92   Temp 99.1 F (37.3 C) (Oral)   Resp 21   SpO2 94%   Physical Exam  Constitutional: She is oriented to person, place, and time. She appears well-developed and well-nourished. No distress.  Morbidly obese, chronically ill. Appears to be somnolent but responds to questions  HENT:  Head: Normocephalic and atraumatic.  Eyes: Conjunctivae are normal. Pupils are equal, round, and reactive to light. Right eye exhibits no discharge. Left eye exhibits no discharge. No scleral icterus.  Neck: Normal range of motion.  Cardiovascular: Normal rate and regular rhythm.  Exam reveals no gallop and no friction rub.   No murmur heard. Pulmonary/Chest: Effort normal and breath sounds normal. No respiratory distress. She has no wheezes. She has no rales. She exhibits no tenderness.  Abdominal: Soft. Bowel sounds are normal. She exhibits no distension and no mass. There is no tenderness. There is no rebound and no guarding. No hernia.  Difficult exam due to body habitus  Musculoskeletal:  Significantly edema in bilateral  legs with blistering. Redness and warmth noted from distal right leg to proximal right leg  Neurological: She is  alert and oriented to person, place, and time.  Skin: Skin is warm and dry.  Psychiatric: She has a normal mood and affect. Her behavior is normal.  Nursing note and vitals reviewed.    ED Treatments / Results  Labs (all labs ordered are listed, but only abnormal results are displayed) Labs Reviewed  CBC WITH DIFFERENTIAL/PLATELET - Abnormal; Notable for the following:       Result Value   RBC 3.28 (*)    Hemoglobin 9.4 (*)    HCT 31.5 (*)    MCHC 29.8 (*)    RDW 16.9 (*)    Neutro Abs 8.2 (*)    All other components within normal limits  COMPREHENSIVE METABOLIC PANEL - Abnormal; Notable for the following:    Chloride 93 (*)    CO2 39 (*)    Glucose, Bld 108 (*)    Albumin 3.2 (*)    All other components within normal limits  URINALYSIS, ROUTINE W REFLEX MICROSCOPIC - Abnormal; Notable for the following:    APPearance HAZY (*)    Protein, ur 30 (*)    Leukocytes, UA TRACE (*)    Bacteria, UA RARE (*)    Squamous Epithelial / LPF 0-5 (*)    All other components within normal limits  I-STAT VENOUS BLOOD GAS, ED - Abnormal; Notable for the following:    pH, Ven 7.455 (*)    pCO2, Ven 65.8 (*)    pO2, Ven 67.0 (*)    Bicarbonate 46.3 (*)    Acid-Base Excess 19.0 (*)    All other components within normal limits  BLOOD GAS, VENOUS  TSH  I-STAT CG4 LACTIC ACID, ED    EKG  EKG Interpretation None       Radiology Dg Chest 2 View  Result Date: 11/24/2016 CLINICAL DATA:  55 year old female with fever EXAM: CHEST  2 VIEW COMPARISON:  Chest radiograph dated 11/12/2016 FINDINGS: The patient is slightly rotated to the left. There is moderate cardiomegaly with mild vascular prominence compatible with congestive changes. No pulmonary edema. There is no focal consolidation, pleural effusion, or pneumothorax. There is degenerative changes of the spine. No acute  osseous pathology identified. IMPRESSION: Cardiomegaly with new congestive changes.  No focal consolidation. Electronically Signed   By: Anner Crete M.D.   On: 11/24/2016 22:55    Procedures Procedures (including critical care time)  Medications Ordered in ED Medications  ceFAZolin (ANCEF) IVPB 1 g/50 mL premix (0 g Intravenous Stopped 11/26/16 1913)     Initial Impression / Assessment and Plan / ED Course  I have reviewed the triage vital signs and the nursing notes.  Pertinent labs & imaging results that were available during my care of the patient were reviewed by me and considered in my medical decision making (see chart for details).  Clinical Course    55 year old female with complicated PMH and social history. Patient is afebrile, not tachycardic or tachypneic, normotensive, and not hypoxic. Cellulitis appears to be worse than baseline. CBC remarkable for anemia which is slightly lower than baseline. CMP remarkable for CO2 of 39 which is at baseline. VBG shows with mild aklalosis an pCO2 65.8. Shared visit with Dr. Ralene Bathe. Due to patient's multiple comorbidities and inability to care for herself will admit for IV abx. Spoke with Family med team who will come to see patient. Appreciate assistance.  Final Clinical Impressions(s) / ED Diagnoses   Final diagnoses:  Cellulitis of right lower extremity  Fever, unspecified fever cause  New Prescriptions Discharge Medication List as of 11/27/2016  7:02 PM    START taking these medications   Details  doxycycline (VIBRA-TABS) 100 MG tablet Take 1 tablet (100 mg total) by mouth 2 (two) times daily., Starting Wed 11/27/2016, Until Sat 12/07/2016, Normal         Recardo Evangelist, PA-C 11/30/16 [place], MD 12/01/16 [phone]

## 2016-11-26 NOTE — Progress Notes (Signed)
CSW familiar with Patient from previous ED visits. Patient was discharged from [facility] inpatient on 08/22/2016 to [facility]. From [facility] she was discharged to [facility] on 08/24/2016. It appears Patient was discharged from [facility] on 10/12/2016 due to "Medicare stopped paying". Patient was admitted to [facility] inpatient from 10/14/16-10/21/16 at which time she was discharged to [facility]. Patient was discharged from SNF around 11/04/2016 at which time she was set up with [facility] for home health. Patient returned to the hospital on 11/12/16 at which time CSW explained to Patient that   "Patient has 5 days remaining and she will then enter her copay days per insurance authorization. Previous nursing facility, [facility], unable to take patient back at this time. Patient reports that she has not personally applied for Medicaid but thinks a Education officer, museum at [facility] may have completed it. CSW will follow up on this. CSW discussed that when she enters her copay days she will be billed by the facility for the co-pays. This was discussed along with the ramifications this will have at any other facility she is discharged to. Patient expressed understanding. CSW has referred Patient to SNF facilities. Awaiting potential bed offers. "   Patient was placed from the ED to [facility] on 11/12/2016 with the understanding that she would be billed by the facility for her co-pay days.   Per notes, Patient left SNF facility AMA on 11/15/2016 and referral was made to [facility] by Family Medicine CSW.   Patient seen yesterday at [facility] ED and [facility] with [facility]. Patient has used all of her insurance coverage days for skilled nursing facility placement. Patient will have to pay privately for skilled nursing facility placement if she would like facility placement. Patient states she does not have Medicaid and she would not be able to pay privately or make  copayments to go into a facility.    Lorrine Kin, [name], [name] [facility] ED/75M Clinical Social Worker [phone]

## 2016-11-26 NOTE — ED Provider Notes (Signed)
FP called to see patient for admission.  They will bring patient in the hospital for IV abx for her cellulitis.    Isla Pence, MD 11/26/16 [phone]

## 2016-11-26 NOTE — Patient Outreach (Signed)
[facility]) Care Management  11/26/2016  Kathleen Roy 10/02/1961 [id number]   Referral received from care management assistant via MD office for [facility] involvement.  Per chart, member has history of hyperlipidemia, depression, panic attacks, hypertension, obstructive sleep apnea, COPD, and obesity.  She was recently in the hospital for shortness of breath/hypoxia, and seen in the ED for weakness.  She was at a SNF/Rehab, but signed out AMA.  She was seen in the ED yesterday for muscular deconditioning, discharged this am.  According to notes, SNF is recommended, but she refused placement.  Call placed to member, brother answers phone and state that the member is unable to talk due to EMS being at the home for the member.  He state that the member was sitting on the bed and began to slide to the floor.  He was unable to pick her up and called EMS for assistance.  This care manager will make another outreach attempt later today, no later than tomorrow to follow up.  [name], [place], MSN [place] [phone]

## 2016-11-26 NOTE — ED Notes (Signed)
Pt placed on bedpan.  When pt removed from bedpan, the bed linens were cleaned, pericare was provided, and the pt was repositioned, with the assistance of [place], NT and [place], EMT.

## 2016-11-26 NOTE — Consult Note (Addendum)
   [facility] CM Inpatient Consult   11/26/2016  MILLISSA MURNANE 21-Oct-1961 [id number]    Made aware of patient's ED admission. Will continue to collaborate with ED [facility] and/or inpatient RNCM if patient is admitted. Questioning whether patient needs psychiatric consult to evaluate for capacity. Will continue to follow.  [name], MSN-Ed, RN,BSN [facility] Liaison [phone]

## 2016-11-26 NOTE — Progress Notes (Signed)
ED CM consulted by Manuela Schwartz of [facility] about pt return to [facility] pushing back [facility] start date. Discussed call to pt and brother states paramedics visiting pt related "feeling like she was about to slide in floor" spoke with EDP Ralene Bathe about pt [facility] ED visit on 12/10-12/11/17, concerns with home care from brother who will be starting oncology tx "thursday" Discussed that Pt was provided [facility], PT/OT, aide, RT and [facility] CM ED CM spoke with [facility] ED CM

## 2016-11-26 NOTE — H&P (Signed)
[facility] Admission History and Physical Service Pager: [phone]  [name] Medical record number: [id number] Date of birth: [birth year] Age: 55 y.o. Gender: female  Primary Care Provider: Evette Doffing, MD Consultants: None Code Status: DNR/DNI  Chief Complaint: Cellulitis  Assessment and Plan: Kathleen Roy is a 55 y.o. female presenting with cellulitis. PMH is significant for COPD, HFpEF, Depression, Generalized Weakness, Obesity  # Cellulitis: History of chronic cellulitis of right lower extremity noted, however per brother it has been spreading over the last several days and seems to be larger than yesterday. Fever of 101.2 on 12/11, afebrile today. Lactic Acid normal. WBC normal at 9.4, improved from 11.7 on 12/10. CXR with persistent vascular congestion, no infiltrates noted. HIV nonreactive in 03/2014. qSOFA score of 1. Doses not meet SIRS criteria. Was prescribed Doxycycline on 12/11, but patient never picked this medication up. Received one dose of Doxycycline in the ED on 12/11. Discussed outpatient vs. Inpatient treatment and patient reports she would have a difficulty time returning if oral antibiotics do not work and often requires multiple EMT crews to transport her to the ED due to her size. Dose of Ancef given in ED.   - Place in Observation, Dr. Erin Hearing attending - AM BMP, CBC - Follow up blood cultures (obtained following antibiotics) - Area of cellulitis demarcated over right leg. Monitor daily for improvement.  - Consult Wound Care given chronic stasis/lymphedema.  - Ceftriaxone (11/26/16>>). Transition to oral antibiotic when able.  - Continue home Percocet as needed for severe pain. Will also continue home Lyrica.   # Weakness complicated by Poor Social Situation: Generalized weakness at baseline. Seen in ED for same on 11/25/16 and cleared for discharge given no medical reason for admission. Brother was unable to help from  bed today. EMS was called and patient transported to ED. [place], physical therapy/OT, Aide, RT, and [facility] CM already ordered, however has not been initiated. Discharged from [facility] 11/04/16 and has not remaining days before entering self-pay period. Was then admitted to [facility] on 11/12/16, but left AMA on 11/15/16. Physical Therapy at ED visit 12/11--recommend HHPT since not eligible for SNF and questioned if wheelchair would be beneficial.  - PT/OT consulted - Case Management consulted. Home Health services ordered, but will need help coordinating initiation of services with discharge.   # COPD: On 4L [place] at baseline. Home Albuterol as needed.  - O2 to keep saturations above 92% - Monitor respiratory status.  - Continue home Albuterol PRN  # HFpEF: Last Echo in 08/2016 with EF 123456, grade 2 diastolic dysfunction. On Torsemide 40mg  twice daily at home.  - Continue home Torsemide - Will obtain weight to compare to previous - Monitor fluid status during hospitalization  # Pre-Diabetes: Last A1C 6.3 in 05/2016. On Glipizide 5mg  daily. - Hold home Glipizide - Monitor daily glucose on BMP. If elevated, consider initiating sliding scale insulin.   # Hyperlipidemia: Home medication includes Atorvastatin.  - Continue home medication  # Depression: Currently prescribed Effexor, Trazodone, and Abilify. - Continue home medications  FEN/GI: Heart Healthy/Carb Modified Prophylaxis: Lovenox  Disposition: Home with Home Health  History of Present Illness:  Kathleen Roy is a 55 y.o. female presenting with for worsening cellulitis of right leg. Reports history of chronic redness and cellulitis of right leg, however normally localized to right lower leg and stops at knee. Over the last several days, it has extended to  her anterior groin. Notes fever of 101 yesterday, afebrile today but does notes some chills. Was given dose of oral Doxycycline in ED on 12/11, however states she  was not given a prescription to continue at discharge. Discussed outpatient vs. Inpatient management--patient and her brother feel that she will be unable to return if oral antibiotics fail without calling in multiple EMT teams to mobilize. Required 9 EMTs today to come to ED. Would prefer admission for IV antibiotics and transition to oral antibiotics. Given poor likelihood of follow up and significant cellulitis, will admit for IV antibiotics. Was given dose of Ancef in ED prior to admission.   Also of note, history of generalized weakness. Reports she has tried four different SNFs/Rehab facilities. Did not care for 3 of the facilities and left AMA due to unhappiness with care provided. Did really love [facility], but she had to leave this facility due to insurance purposes. Home Health RN, physical therapy/OT, Aid, RT, and [facility] CM ordered on 12/11, however these services have not yet started. Brother is unable to lift or help her stand on his own--his health has also been declining and he is about to start cancer treatments.   Review Of Systems: Per HPI  ROS  Patient Active Problem List   Diagnosis Date Noted  . Cough   . Insomnia   . Palliative care by specialist   . Advance care planning   . Chronic pain of both knees   . Acute respiratory failure with hypoxia and hypercarbia ([place]) 10/15/2016  . General weakness   . Hypoxia 10/14/2016  . Bacteremia   . Cellulitis of right lower extremity 06/04/2016  . Type 2 diabetes mellitus ([place]) 02/14/2016  . Pressure ulcer 01/16/2016  . Acute on chronic respiratory failure with hypercapnia ([place])   . Chronic diastolic congestive heart failure ([place])   . Essential hypertension   . CAP (community acquired pneumonia) 01/15/2016  . Bilateral knee pain 03/22/2015  . OSA (obstructive sleep apnea)   . Obesity hypoventilation syndrome ([place])   . Chronic respiratory failure with hypoxia ([place])   . Assistance needed for grooming 11/08/2014  .  Fatigue 11/03/2014  . Memory loss 03/18/2014  . Tremor 03/18/2014  . Vulvovaginal candidiasis 11/25/2013  . Restrictive airway disease 08/26/2013  . Hypokalemia 08/19/2013  . Health care maintenance 06/17/2013  . Venous stasis dermatitis of both lower extremities 12/02/2012  . COPD (chronic obstructive pulmonary disease) ([place]) 10/15/2011  . Peripheral neuropathy ([place]) 10/15/2011  . KNEE PAIN, BILATERAL 01/22/2011  . BACK PAIN, LUMBAR, CHRONIC 01/22/2011  . MIGRAINE HEADACHE 10/25/2010  . HYPERLIPIDEMIA 04/07/2009  . EDEMA, CHRONIC 06/27/2008  . Morbid obesity ([place]) 11/16/2007  . DEPRESSION, MAJOR, RECURRENT 02/12/2007  . PANIC ATTACKS 02/12/2007  . HYPERTENSION, BENIGN SYSTEMIC 02/12/2007    Past Medical History: Past Medical History:  Diagnosis Date  . Arthritis    "I feel like it's everywhere" (11/08/2014)  . Cellulitis and abscess of lower extremity 08/16/2016   right leg  . Chronic lower back pain   . COPD (chronic obstructive pulmonary disease) ([place])   . Depression   . Diabetes mellitus without complication ([place])    type 2   . Fibromyalgia   . Headache    "at least weekly; it's usually when I first wake up" (11/08/2014)  . Hyperlipidemia   . Hypertension   . HYPERTENSION, BENIGN SYSTEMIC 02/12/2007  . Memory disturbance   . Migraines 1970's - <2000   "they just went away"  . Neuropathy (  [place])   . Normal echocardiogram 05/30/05   suboptimal study  . Obesity, morbid (more than 100 lbs over ideal weight or BMI > 40) (HCC)    obese since childhood  . Osteoarthritis   . Peripheral edema   . Pressure ulcer of foot    left  . Sepsis ([place])   . Shortness of breath dyspnea   . Sleep apnea    "recently dx'd; haven't got my equipment yet" (11/08/2014)    Past Surgical History: Past Surgical History:  Procedure Laterality Date  . ABDOMINAL HERNIA REPAIR  2002  . APPENDECTOMY  1995  . CHOLECYSTECTOMY  1995  . TOTAL ABDOMINAL HYSTERECTOMY  1994    Social  History: Social History  Substance Use Topics  . Smoking status: Former Smoker    Packs/day: 1.00    Years: 30.00    Types: Cigarettes    Quit date: 01/05/2009  . Smokeless tobacco: Never Used  . Alcohol use No   Please also refer to relevant sections of EMR.  Family History: Family History  Problem Relation Age of Onset  . Stroke Mother   . Dementia Father   . Prostate cancer Father   . Congestive Heart Failure Brother   . Diabetes Brother   . Hypertension Brother    Allergies and Medications: Allergies  Allergen Reactions  . Aspirin Hives    REACTION: hives Hasn't had in years    No current facility-administered medications on file prior to encounter.    Current Outpatient Prescriptions on File Prior to Encounter  Medication Sig Dispense Refill  . albuterol (PROVENTIL HFA;VENTOLIN HFA) 108 (90 Base) MCG/ACT inhaler Inhale 2 puffs into the lungs every 4 (four) hours as needed for wheezing or shortness of breath. 1 Inhaler 2  . ARIPiprazole (ABILIFY) 2 MG tablet take 1 tablet by mouth every other day (Patient taking differently: Take 2 mg by mouth every other day) 45 tablet 0  . Ascorbic Acid (VITAMIN C) 1000 MG tablet Take 3,000 mg by mouth at bedtime.     Marland Kitchen atorvastatin (LIPITOR) 10 MG tablet Take 10 mg by mouth at bedtime.    . cetirizine (ZYRTEC) 10 MG tablet Take 10 mg by mouth daily.    . cyclobenzaprine (FLEXERIL) 10 MG tablet Take 1 tablet (10 mg total) by mouth 3 (three) times daily as needed for muscle spasms. 90 tablet 2  . fluticasone furoate-vilanterol (BREO ELLIPTA) 100-25 MCG/INH AEPB Inhale 1 puff into the lungs daily as needed (for SOB).     . meclizine (ANTIVERT) 25 MG tablet Take 25 mg by mouth every 6 (six) hours as needed for dizziness.    . nystatin (MYCOSTATIN) powder Apply topically 2 (two) times daily. Apply to affected skin BID (Patient taking differently: Apply topically 2 (two) times daily as needed. TO AFFECTED AREAS) 360 g 6  . Omega-3 Fatty  Acids (FISH OIL) 1000 MG CAPS Take 2 capsules (2,000 mg total) by mouth at bedtime. 60 capsule 2  . oxyCODONE-acetaminophen (PERCOCET) 10-325 MG tablet Take 1 tablet by mouth every 6 (six) hours as needed for pain.     . OXYGEN Inhale 4 L into the lungs continuous. 4l/min     . polyethylene glycol (MIRALAX / GLYCOLAX) packet Take 17 g by mouth daily as needed for mild constipation.     . potassium chloride SA (K-DUR,KLOR-CON) 20 MEQ tablet Take 2 tablets (40 mEq total) by mouth daily. 180 tablet 0  . pregabalin (LYRICA) 150 MG capsule Take 1 capsule (  150 mg total) by mouth 2 (two) times daily. 120 capsule 0  . Psyllium (EQ DAILY FIBER PO) Take 1 capsule by mouth daily.    Marland Kitchen torsemide (DEMADEX) 20 MG tablet Take 40 mg by mouth in the AM and 20 mg by mouth in the PM. (Patient taking differently: Take 40 mg by mouth 2 (two) times daily. ) 360 tablet 1  . traZODone (DESYREL) 150 MG tablet Take 2 tablets (300 mg total) by mouth at bedtime. 180 tablet 1  . venlafaxine XR (EFFEXOR-XR) 150 MG 24 hr capsule Take 2 capsules (300 mg total) by mouth daily. 180 capsule 0  . glipiZIDE (GLUCOTROL) 5 MG tablet take 1 tablet by mouth once daily (Patient not taking: Reported on 11/26/2016) 30 tablet 2    Objective: BP 126/72   Pulse 88   Temp 99.5 F (37.5 C) (Axillary)   Resp 20   SpO2 96%  Exam: General: 55yo female resting comfortably Eyes: PERRLA, White Sclera ENTM: Moist mucous membranes, oropharynx clear Neck: Supple, no lymphadenopathy Cardiovascular: Distant heart sounds, regular rate and rhythm Respiratory: Clear to auscultation bilaterally, no increased work of breathing, [place] in place Gastrointestinal: Soft and nondistended, nontender MSK: Chronic lower extremity edema right>left, muscle strength 5/5 in upper and lower extremities Derm: Increased erythema and warmth of right leg, please see picture below Neuro: No focal deficits, sensation intact Psych: AAOx3, normal affect      Labs and  Imaging: CBC BMET   Recent Labs Lab 11/26/16 1454  WBC 9.4  HGB 9.4*  HCT 31.5*  PLT 191    Recent Labs Lab 11/26/16 1454  NA 142  K 3.8  CL 93*  CO2 39*  BUN 13  CREATININE 0.99  GLUCOSE 108*  CALCIUM 9.5    - Lactic Acid 1.35 - TSH 0.439  Dg Chest Port 1 View  Result Date: 11/26/2016 CLINICAL DATA:  Weakness and shortness of Breath EXAM: PORTABLE CHEST 1 VIEW COMPARISON:  11/24/2016 FINDINGS: Cardiac shadow remains enlarged. Vascular congestion is again identified stable from the prior exam. No focal infiltrate or sizable effusion is seen. No bony abnormality is noted. IMPRESSION: Persistent vascular congestion. Electronically Signed   By: Inez Catalina M.D.   On: 11/26/2016 15:19   Lorna Few, DO 11/26/2016, 4:59 PM PGY-3, [place] Intern pager: [phone], text pages welcome

## 2016-11-27 ENCOUNTER — Telehealth: Payer: Self-pay | Admitting: Licensed Clinical Social Worker

## 2016-11-27 DIAGNOSIS — L03115 Cellulitis of right lower limb: Secondary | ICD-10-CM

## 2016-11-27 DIAGNOSIS — L0390 Cellulitis, unspecified: Secondary | ICD-10-CM | POA: Diagnosis not present

## 2016-11-27 LAB — BASIC METABOLIC PANEL
ANION GAP: 9 (ref 5–15)
BUN: 12 mg/dL (ref 6–20)
CO2: 39 mmol/L — ABNORMAL HIGH (ref 22–32)
CREATININE: 0.83 mg/dL (ref 0.44–1.00)
Calcium: 9 mg/dL (ref 8.9–10.3)
Chloride: 93 mmol/L — ABNORMAL LOW (ref 101–111)
GFR calc non Af Amer: >60 mL/min (ref >60)
Glucose, Bld: 117 mg/dL — ABNORMAL HIGH (ref 65–99)
POTASSIUM: 3.6 mmol/L (ref 3.5–5.1)
SODIUM: 141 mmol/L (ref 135–145)

## 2016-11-27 LAB — CBC
HCT: 28.5 % — ABNORMAL LOW (ref 36.0–46.0)
HEMATOCRIT: 27.4 % — AB (ref 36.0–46.0)
HEMOGLOBIN: 8.3 g/dL — AB (ref 12.0–15.0)
Hemoglobin: 8 g/dL — ABNORMAL LOW (ref 12.0–15.0)
MCH: 28.2 pg (ref 26.0–34.0)
MCH: 28.1 pg (ref 26.0–34.0)
MCHC: 29.2 g/dL — ABNORMAL LOW (ref 30.0–36.0)
MCHC: 29.1 g/dL — ABNORMAL LOW (ref 30.0–36.0)
MCV: 96.5 fL (ref 78.0–100.0)
MCV: 96.6 fL (ref 78.0–100.0)
PLATELETS: 153 10*3/uL (ref 150–400)
Platelets: 159 10*3/uL (ref 150–400)
RBC: 2.84 MIL/uL — AB (ref 3.87–5.11)
RBC: 2.95 MIL/uL — AB (ref 3.87–5.11)
RDW: 16.9 % — ABNORMAL HIGH (ref 11.5–15.5)
RDW: 16.8 % — ABNORMAL HIGH (ref 11.5–15.5)
WBC: 7.4 10*3/uL (ref 4.0–10.5)
WBC: 6.2 10*3/uL (ref 4.0–10.5)

## 2016-11-27 LAB — RETICULOCYTES
RBC.: 2.95 MIL/uL — ABNORMAL LOW (ref 3.87–5.11)
Retic Count, Absolute: 94.4 10*3/uL (ref 19.0–186.0)
Retic Ct Pct: 3.2 % — ABNORMAL HIGH (ref 0.4–3.1)

## 2016-11-27 LAB — GLUCOSE, CAPILLARY: Glucose-Capillary: 113 mg/dL — ABNORMAL HIGH (ref 65–99)

## 2016-11-27 MED ORDER — ENOXAPARIN SODIUM 80 MG/0.8ML ~~LOC~~ SOLN: 80.0000 mg | SUBCUTANEOUS | Status: DC

## 2016-11-27 MED ORDER — HYDROCERIN EX CREA
TOPICAL_CREAM | Freq: Every day | CUTANEOUS | Status: DC
  Administered 2016-11-27: 11:00:00 via TOPICAL
  Filled 2016-11-27: qty 113

## 2016-11-27 MED ORDER — DOXYCYCLINE HYCLATE 100 MG PO TABS: 100.0000 mg | ORAL_TABLET | Freq: Two times a day (BID) | ORAL | 0 refills | Status: AC

## 2016-11-27 NOTE — Consult Note (Signed)
   [facility] CM Inpatient Consult   11/27/2016  ASATA MIGLIORE Aug 23, 1961 [id number]    [facility] Care Management follow up. Spoke with Ms. Guthridge at bedside to discuss [place] Management program. Discussed that Outpatient Carecenter received referral from MD office. Discussed back to back admission. Written consent obtained and [facility] Care Management packet provided. Also spoke with Casimer Lanius, Family Medicine Licensed CSW about Ms. Lishman' situation. Discussed that Ms. Certo needs to fill out a Insurance underwriter. Suggested that Hospital Liaison contact Financial Counselor at [facility] to see if they help initiate the process.   Left voicemail for [place], Financial Counselor to request that she assist with Medicaid application process. Ms. Sirmon could benefit from [facility] for Nursing home placement. Spoke with inpatient RNCM as well to discuss. It appears  Ms. Lackland has run out of SNF days where she does not have to pay a copay. Ms. Greenan states she owes money to 2 SNF facilities in [place]. She is not opposed to going to SNF. She states "it just depends on which one".   Ms. Silverwood indicates her brother starts chemotherapy treatments on tomorrow, 11/28/16. He is her main caregiver. She also states she usually calls EMS to assist getting her in and out of the house. States now that she uses a walker and has 3 steps to get in her house, she has trouble going getting in and out of her home to go to the MD office. All and all Ms. Charpentier really needs placement but due to her financial situation and unknown status of Medicaid, there are not many more options.   Will update [facility] team about this unfortunate situation. Will make inpatient RNCM aware of the above regarding brother's chemo treatments as he is her primary caregiver.   [name], MSN-Ed, RN,BSN [facility] Liaison [phone]

## 2016-11-27 NOTE — Care Management Obs Status (Signed)
[place] NOTIFICATION   Patient Details  Name: Kathleen Roy MRN: [id number] Date of Birth: 17-Oct-1961   Medicare Observation Status Notification Given:  Yes  Discussed Medicare Obs letter with patient. Patient voiced understanding , declined to sign. Copy left at bedside.  Marilu Favre, RN 11/27/2016, 10:14 AM

## 2016-11-27 NOTE — Progress Notes (Signed)
Phone call from patient wanting to know what services are available for her brother.  Patient is concerned about her brother not being able to care for her or for himself due to starting chemo treatment tomorrow.  Per patient "I weigh 400lbs and he is getting to the point of not being able to pull me up out of bed".  [name] provided emotional support as patient was tearful during the conversation. The following was discussed: support options for patient and brother, [facility] referral for patient, Medicaid for patient and long-term care options.  [name] utilized motivational interviewing to assist patient with acknowledging that her brother is unable to care for her and that she needs help.   [name] also spoke with [facility] care manager, Orrin Brigham for coordination of patient's care. Plan:  1. Orrin Brigham will [place] to start a Medicaid application for LTC 2. [facility] will follow patient when she is discharged home 3. [name] is available as needed for additional support  4. Patient will follow up with [name] as needed.   Casimer Lanius, [name] Licensed Clinical Social Worker [facility]   [phone] 1:40 PM

## 2016-11-27 NOTE — Care Management Note (Addendum)
Case Management Note  Patient Details  Name: Kathleen Roy MRN: [id number] Date of Birth: 1961-07-05  Subjective/Objective:                    Action/Plan: Dr Lyndal Pulley will work on discharge. Patient and bedside nurse aware . Patient will call brother Kathleen Roy. Hassan Rowan ( bedside nurse agreed to call [facility] when discharge paperwork completed. Per Dr Reynaldo Minium patient not meeting observation criteria . Spoke with patient ABN given , patient voiced understanding . Spoke with Family medicine they planned on changing ABX to PO , will discuss with team and call CM back.   Patient aware discharge may be today . Patient called her brother both are in agreement for discharge today , if discharge is tomorrow brother requesting discharge in afternoon because he has appointments in morning.   Patient will need ambulance transportation home . Brother is at home this afternoon to help EMS get her settled..   Awaiting Family Medicine to call back.   Ambulance transportation paper work completed with [facility] number on it.   If patient discharges to home today bedside nurse just has to call [facility] .   Bedside nurse Hassan Rowan aware. Expected Discharge Date:                  Expected Discharge Plan:  [place]  In-House Referral:     Discharge planning Services  CM Consult  Post Acute Care Choice:  Durable Medical Equipment, Home Health Choice offered to:  Patient  DME Arranged:    DME Agency:     HH Arranged:  RN, PT, OT, Nurse's Aide, Speech Therapy HH Agency:     Status of Service:  In process, will continue to follow  If discussed at Long Length of Stay Meetings, dates discussed:    Additional Comments:  Marilu Favre, RN 11/27/2016, 3:16 PM

## 2016-11-27 NOTE — Progress Notes (Addendum)
[name] working with patient and other social workers obtained information about Medicaid from recent admission to [facility].  Patient refused to pay co-pay days once she hit more than 20 days at SNF, thus resulting in accruing a bill. Patient then went to SNF at [facility], was unhappy with SNF and left AMA again accruing a bill.  [place] attempted to assist patient in applying for Medicaid, however patient refused reporting "I do not want to give up the check".    Patient remains to have SNF days (but these are co-pay days, 160.00/day, that she refuses to pay and reports she cannot pay.  This information was communicated to medical director as well as unit RN CM, and [facility]. Call placed to [facility] with Family Medicine (SW) to obtain any other information as she has worked with patient in the outpatient setting.  Patient is unsure if she will qualify for Medicaid. Patient will follow up with [facility] and SW to apply for medicaid.    Will continue to follow as needed and assist with disposition.  Lane Hacker, [name] Clinical Social Work: [employer] Coverage for :  [phone]

## 2016-11-27 NOTE — Progress Notes (Signed)
Pt discharge to home via [facility], discharge instructions given to pt, verbalized understanding.

## 2016-11-27 NOTE — Progress Notes (Signed)
Family Medicine Teaching Service Daily Progress Note Intern Pager: [phone]  [name] Medical record number: [id number] Date of birth: 1961/04/09 Age: 55 y.o. Gender: female  Primary Care Provider: Evette Doffing, MD Consultants: wound care, PT/OT, CM Code Status: DNR  Pt Overview and Major Events to Date:  12/12- admitted to [facility]PTS  Assessment and Plan: Kathleen Roy is a 55 y.o. female presenting with cellulitis. PMH is significant for COPD, HFpEF, Depression, Generalized Weakness, Obesity  # Cellulitis: worsened from baseline. On IV antibiotics, afebrile, WBC normal.  - Follow up blood cultures (obtained following antibiotics)- pending - monitor cellulitis for improvement - wound care consulted, appreciate recommendations - On CTX, day 2. Transition to oral antibiotics if cellulitis improves - percocet as needed for severe pain - Continue home Lyrica.   # Weakness complicated by Poor Social Situation: Patient not eligible for further SNF days, has HH PT/OT, RT, RN, and aide ordered but not yet in place (from ED visit on 12/11).  - PT/OT consulted- patient may benefit from wheelchair? - Case Management consulted. Home Health services ordered, but will need help coordinating initiation of services with discharge.   # COPD: On 4L [place] at baseline. Home Albuterol as needed.  - O2 to keep saturations above 92% - Monitor respiratory status.  - Continue home Albuterol PRN  # HFpEF: Last Echo in 08/2016 with EF 123456, grade 2 diastolic dysfunction. On Torsemide 40mg  twice daily at home. 382 pounds measured this morning, measured at 400 on admission.  - Continue home Torsemide - Will obtain weight to compare to previous - Monitor fluid status during hospitalization  # Pre-Diabetes: Last A1C 6.3 in 05/2016. On Glipizide 5mg  daily. Fasting sugar 113 this morning - Hold home Glipizide - Monitor daily glucose on BMP  FEN/GI: Heart Healthy/Carb Modified Prophylaxis:  Lovenox 80 mg daily  Disposition: home with [facility] PT/OT/RN  Subjective:  Kathleen Roy is resting comfortably. No needs or concerns at this time. No fevers or chills.  Objective: Temp:  [98.7 F (37.1 C)-99.5 F (37.5 C)] 98.7 F (37.1 C) (12/13 0556) Pulse Rate:  [83-95] 83 (12/13 0556) Resp:  [16-29] 16 (12/12 1945) BP: (108-126)/(51-72) 108/60 (12/13 0556) SpO2:  [93 %-98 %] 96 % (12/13 0556) Weight:  [382 lb (173.3 kg)-400 lb (181.4 kg)] 382 lb (173.3 kg) (12/13 0556) Physical Exam: General: morbidly obese woman laying in bed in NAD, [place] in place on 4L O2 Cardiovascular: RRR no MRG Respiratory: CTA bilaterally. No increased work of breathing. Abdomen: obese abdomen. Soft. Extremities: cellulitis extending up to thigh on right. Chronic venous stasis changes present bilaterally.  Laboratory:  Recent Labs Lab 11/24/16 2203 11/26/16 1454 11/27/16 0521  WBC 11.7* 9.4 7.4  HGB 10.3* 9.4* 8.0*  HCT 34.0* 31.5* 27.4*  PLT 228 191 153    Recent Labs Lab 11/24/16 2203 11/26/16 1454 11/27/16 0521  NA 140 142 141  K 3.7 3.8 3.6  CL 90* 93* 93*  CO2 40* 39* 39*  BUN 15 13 12   CREATININE 0.84 0.99 0.83  CALCIUM 9.3 9.5 9.0  PROT 7.9 7.2  --   BILITOT 0.9 0.7  --   ALKPHOS 81 73  --   ALT 13* 25  --   AST 21 33  --   GLUCOSE 108* 108* 117*     Imaging/Diagnostic Tests: Dg Chest 2 View  Result Date: 11/24/2016 CLINICAL DATA:  55 year old female with fever EXAM: CHEST  2 VIEW COMPARISON:  Chest radiograph dated  11/12/2016 FINDINGS: The patient is slightly rotated to the left. There is moderate cardiomegaly with mild vascular prominence compatible with congestive changes. No pulmonary edema. There is no focal consolidation, pleural effusion, or pneumothorax. There is degenerative changes of the spine. No acute osseous pathology identified. IMPRESSION: Cardiomegaly with new congestive changes.  No focal consolidation. Electronically Signed   By: Anner Crete M.D.   On:  11/24/2016 22:55   Dg Chest Port 1 View  Result Date: 11/26/2016 CLINICAL DATA:  Weakness and shortness of Breath EXAM: PORTABLE CHEST 1 VIEW COMPARISON:  11/24/2016 FINDINGS: Cardiac shadow remains enlarged. Vascular congestion is again identified stable from the prior exam. No focal infiltrate or sizable effusion is seen. No bony abnormality is noted. IMPRESSION: Persistent vascular congestion. Electronically Signed   By: Inez Catalina M.D.   On: 11/26/2016 15:19     Steve Rattler, DO 11/27/2016, 8:00 AM PGY-1, [place] Intern pager: [phone], text pages welcome

## 2016-11-27 NOTE — Progress Notes (Addendum)
Physical Therapy Evaluation Patient Details Name: Kathleen Roy MRN: [id number] DOB: 20-Jul-1961 Today's Date: 11/27/2016   History of Present Illness  Pt admitted from home recent DC from SNF about 12/1 with fever and weakness.On Home Oxygen. Marland Kitchen PMH: morbid obesity, chronic LE cellulitis, foot ulcer, depression, fibromyalgia, headaches, neuropathy, HTN, CHF, COPD.   Clinical Impression  PTA, pt required assistance for IADLs and transfers. Pt currently lives with her brother, who is undergoing chemotherapy beginning this week and may not be physically able to assist her on a daily basis. Pt was able to sit EOB in order for nurse to bathe with washcloth. Pt able to then transfer to chair with mod assist +3 for physical assist. Pt had greatest difficulty with power up to stand but once standing was able to pivot with +2 mod assist for safety. Pt will benefit from HHPT to address deficits in strength and mobility to decrease caregiver burden. PT will continue to follow acutely. Pt required 4L O2 today via nasal cannula.    Follow Up Recommendations Home health PT (HHOT, [place]; Pt not eligible for SNF per notes)    Equipment Recommendations  None recommended by PT    Recommendations for Other Services       Precautions / Restrictions Precautions Precautions: Fall Precaution Comments: morbid obesity Restrictions Weight Bearing Restrictions: No      Mobility  Bed Mobility Overal bed mobility: Needs Assistance Bed Mobility: Supine to Sit     Supine to sit: Mod assist;+2 for physical assistance;+2 for safety/equipment     General bed mobility comments: Pt required assist for LE mobility out of bed and trunk elevation +2.   Transfers Overall transfer level: Needs assistance Equipment used: Rolling walker (2 wheeled) Transfers: Sit to/from [facility] Sit to Stand: Mod assist;+2 physical assistance;+2 safety/equipment Stand pivot transfers: +2 safety/equipment;Min  assist       General transfer comment: Pt required assist to power up to stand +2 and +2 for safety; once standing pt able to take a few steps to pivot to chair and required decreased assist (+2 for safety).   Ambulation/Gait                Stairs            Wheelchair Mobility    Modified Rankin (Stroke Patients Only)       Balance Overall balance assessment: Needs assistance Sitting-balance support: Bilateral upper extremity supported;Feet supported Sitting balance-Leahy Scale: Fair Sitting balance - Comments: Pt able to sit EOB for >7 minutes without physical assist with B UE support;   Standing balance support: Bilateral upper extremity supported;During functional activity Standing balance-Leahy Scale: Poor Standing balance comment: Requires B UE support from RW to maintain standing balance                             Pertinent Vitals/Pain Pain Assessment: 0-10 Pain Score: 10-Worst pain ever Pain Location: B Knees, ankles Pain Descriptors / Indicators: Aching Pain Intervention(s): Limited activity within patient's tolerance;Monitored during session;Repositioned         4L O2 via nasal cannula  Home Living Family/patient expects to be discharged to:: Private residence Living Arrangements: Other relatives Available Help at Discharge: Family;Available 24 hours/day Type of Home: House Home Access: Stairs to enter Entrance Stairs-Rails: Psychiatric nurse of Steps: 3 Home Layout: One level Home Equipment: Walker - 4 wheels;Bedside commode Additional Comments: Reports that doors are too narrow  to accomodate bariatric walker or her transport chair    Prior Function Level of Independence: Needs assistance   Gait / Transfers Assistance Needed: transfers and minimal ambulation with RW  ADL's / Homemaking Assistance Needed: Needs assist with bathing and dressing        Hand Dominance   Dominant Hand: Right    Extremity/Trunk  Assessment   Upper Extremity Assessment Upper Extremity Assessment: Defer to OT evaluation    Lower Extremity Assessment Lower Extremity Assessment: RLE deficits/detail;LLE deficits/detail RLE Deficits / Details: able to lift leg 60 degrees from EOB, redness and changes in skin texture LLE Deficits / Details: able to raise leg to ~90 degrees and hold for 5-10 seconds x2.        Communication   Communication: No difficulties  Cognition Arousal/Alertness: Awake/alert Behavior During Therapy: WFL for tasks assessed/performed Overall Cognitive Status: Within Functional Limits for tasks assessed                      General Comments      Exercises     Assessment/Plan    PT Assessment Patient needs continued PT services  PT Problem List Decreased strength;Decreased range of motion;Decreased activity tolerance;Decreased balance;Decreased mobility;Decreased knowledge of use of DME;Decreased safety awareness;Decreased knowledge of precautions;Obesity;Pain;Decreased skin integrity          PT Treatment Interventions DME instruction;Gait training;Therapeutic activities;Therapeutic exercise;Functional mobility training;Patient/family education    PT Goals (Current goals can be found in the Care Plan section)  Acute Rehab PT Goals Patient Stated Goal: to go home PT Goal Formulation: With patient Time For Goal Achievement: 12/11/16 Potential to Achieve Goals: Fair    Frequency Min 3X/week   Barriers to discharge Decreased caregiver support Pts brother to start chemo this week    Co-evaluation               End of Session Equipment Utilized During Treatment: Gait belt Activity Tolerance: Patient tolerated treatment well Patient left: in chair;with call bell/phone within reach Nurse Communication: Mobility status;Need for lift equipment    Functional Assessment Tool Used: clinical judgment Functional Limitation: Mobility: Walking and moving around Mobility:  Walking and Moving Around Current Status [phone]): At least 40 percent but less than 60 percent impaired, limited or restricted Mobility: Walking and Moving Around Goal Status [phone]): At least 1 percent but less than 20 percent impaired, limited or restricted    Time: 1005-1045 PT Time Calculation (min) (ACUTE ONLY): 40 min   Charges:   PT Evaluation $PT Eval Moderate Complexity: 1 Procedure PT Treatments $Therapeutic Activity: 23-37 mins   PT G Codes:   PT G-Codes **NOT FOR INPATIENT CLASS** Functional Assessment Tool Used: clinical judgment Functional Limitation: Mobility: Walking and moving around Mobility: Walking and Moving Around Current Status [id number]): At least 40 percent but less than 60 percent impaired, limited or restricted Mobility: Walking and Moving Around Goal Status [phone]): At least 1 percent but less than 20 percent impaired, limited or restricted    Tonia Brooms 11/27/2016, 2:02 PM Tonia Brooms, SPT [phone]

## 2016-11-27 NOTE — Care Management Note (Addendum)
Case Management Note  Patient Details  Name: DANNYA VARGASON MRN: [id number] Date of Birth: [birth year]  Subjective/Objective:                    Action/Plan: Paged Family Medicine for home health orders for RN,PT,OT,SP,aide Discussed disposition with patient. Patient lives at home with her brother , she has a walker and home oxygen . Patient has portable oxygen tank at home . But will need ambulance transportation home at discharge. Confirmed face sheet information.   She has home health through [facility] .  Will need orders to resume home health RN,PT,OT, aide. Santiago Glad with [facility] aware patient has been admitted.  Per SW patient has used her SNF days with insurance , on Nov 15, 2016 left AMA , has not applied for Medicaid and cannot pay co pays  Expected Discharge Date:                  Expected Discharge Plan:  [place]  In-House Referral:     Discharge planning Services  CM Consult  Post Acute Care Choice:  Durable Medical Equipment, Home Health Choice offered to:  Patient  DME Arranged:    DME Agency:     HH Arranged:  RN, PT, OT, Nurse's Aide [place] Agency:     Status of Service:  In process, will continue to follow  If discussed at Long Length of Stay Meetings, dates discussed:    Additional Comments:  Marilu Favre, RN 11/27/2016, 10:08 AM

## 2016-11-27 NOTE — Discharge Instructions (Signed)
Cellulitis, Adult °Introduction °Cellulitis is a skin infection. The infected area is usually red and sore. This condition occurs most often in the arms and lower legs. It is very important to get treated for this condition. °Follow these instructions at home: °· Take over-the-counter and prescription medicines only as told by your doctor. °· If you were prescribed an antibiotic medicine, take it as told by your doctor. Do not stop taking the antibiotic even if you start to feel better. °· Drink enough fluid to keep your pee (urine) clear or pale yellow. °· Do not touch or rub the infected area. °· Raise (elevate) the infected area above the level of your heart while you are sitting or lying down. °· Place warm or cold wet cloths (warm or cold compresses) on the infected area. Do this as told by your doctor. °· Keep all follow-up visits as told by your doctor. This is important. These visits let your doctor make sure your infection is not getting worse. °Contact a doctor if: °· You have a fever. °· Your symptoms do not get better after 1-2 days of treatment. °· Your bone or joint under the infected area starts to hurt after the skin has healed. °· Your infection comes back. This can happen in the same area or another area. °· You have a swollen bump in the infected area. °· You have new symptoms. °· You feel ill and also have muscle aches and pains. °Get help right away if: °· Your symptoms get worse. °· You feel very sleepy. °· You throw up (vomit) or have watery poop (diarrhea) for a long time. °· There are red streaks coming from the infected area. °· Your red area gets larger. °· Your red area turns darker. °This information is not intended to replace advice given to you by your health care provider. Make sure you discuss any questions you have with your health care provider. °Document Released: 05/20/2008 Document Revised: 05/09/2016 Document Reviewed: 10/11/2015 °© 2017 Elsevier ° °

## 2016-11-27 NOTE — Evaluation (Signed)
Occupational Therapy Evaluation Patient Details Name: Kathleen Roy MRN: [id number] DOB: August 31, 1961 Today's Date: 11/27/2016    History of Present Illness Pt admitted from home recent DC from SNF about 12/1 with fever and weakness.On Home Oxygen. Marland Kitchen PMH: morbid obesity, chronic LE cellulitis, foot ulcer, depression, fibromyalgia, headaches, neuropathy, HTN, CHF, COPD.    Clinical Impression   Pt is at recent baseline since DC from SNF. Her brother has been completing all caregiving tasks. Pt and brother report the biggest concern is getting Pt in and out of bed. Pt and brother provided with handout for safe mobility and demonstration of rolling technique. Please see performance below for ADL. Pt's brother is starting chemotherapy tomorrow and therapy is genuinely concerned for his health as caregiver in continuing to provide the level of care that he has been providing. Pt needs HHOT to come into the home to develop strategies and techniques to decrease caregiver burden as well as maximize ADL independence and safety with the Pt.     Follow Up Recommendations  Home health OT;Supervision/Assistance - 24 hour    Equipment Recommendations  None recommended by OT (Pt has appropriate DME)    Recommendations for Other Services       Precautions / Restrictions Precautions Precautions: Fall Precaution Comments: morbid obesity Restrictions Weight Bearing Restrictions: No      Mobility Bed Mobility Overal bed mobility: Needs Assistance Bed Mobility: Supine to Sit     Supine to sit: Mod assist;+2 for physical assistance;+2 for safety/equipment     General bed mobility comments: Pt required assist for LE mobility out of bed and trunk elevation +2.   Transfers                 General transfer comment: transfer not attempted this session    Balance Overall balance assessment: Needs assistance Sitting-balance support: Bilateral upper extremity supported;Feet supported Sitting  balance-Leahy Scale: Fair Sitting balance - Comments: Pt able to sit EOB for grooming tasks.                                     ADL Overall ADL's : At baseline                                       General ADL Comments: Pt is most concerned about transfers in and out of bed. At home, her brother has been pulling her out of bed once she is in sitting, and having to lift her BLE into bed using a leg lifter. Pt reports that she is constantly in extreme fear of falling or sliding off surfaces. She reports that she more often then not does not bathe at home. Pt and brother concerned as he is starting chemotherapy that he will not be able to provide physical level of support that he has been providing. OT provided education on rolling technique for bed mobility, as well as information on ramps, and energy conservation.     Vision     Perception     Praxis      Pertinent Vitals/Pain Pain Assessment: 0-10 Pain Score: 10-Worst pain ever Pain Location: B Knees, ankles, back Pain Descriptors / Indicators: Aching Pain Intervention(s): Limited activity within patient's tolerance;Monitored during session     Hand Dominance Right   Extremity/Trunk Assessment Upper Extremity Assessment Upper  Extremity Assessment: Generalized weakness   Lower Extremity Assessment Lower Extremity Assessment: Defer to PT evaluation   Cervical / Trunk Assessment Cervical / Trunk Assessment: Other exceptions (Pt reported back pain, morbid obesity)   Communication Communication Communication: No difficulties   Cognition Arousal/Alertness: Awake/alert Behavior During Therapy: WFL for tasks assessed/performed Overall Cognitive Status: Within Functional Limits for tasks assessed                 General Comments: emotional when talking about concerns for home   General Comments       Exercises       Shoulder Instructions      Home Living Family/patient expects to  be discharged to:: Private residence Living Arrangements: Other relatives Available Help at Discharge: Family;Available 24 hours/day Type of Home: House Home Access: Stairs to enter [employer] of Steps: 3 Entrance Stairs-Rails: Right;Left Home Layout: One level     Bathroom Shower/Tub: Tub/shower unit Shower/tub characteristics: Curtain Biochemist, clinical: Standard     Home Equipment: Environmental consultant - 4 wheels;Bedside commode;Tub bench;Transport chair   Additional Comments: Reports that doors are too narrow to accomodate bariatric walker or her transport chair      Prior Functioning/Environment Level of Independence: Needs assistance  Gait / Transfers Assistance Needed: transfers and minimal ambulation with RW ADL's / Homemaking Assistance Needed: Pt reports having a brand new tub bench, and not being able to use it. Pt reports that she often just doesn't bathe. Bariatric BSC is in kitchen.   Comments: Pt can only leave her home with ambulance transportation. Brother who has terminal cancer and to start teatments this week is caregiver.        OT Problem List: Decreased strength;Decreased range of motion;Decreased activity tolerance;Impaired balance (sitting and/or standing);Decreased safety awareness;Decreased knowledge of use of DME or AE;Obesity;Pain   OT Treatment/Interventions:      OT Goals(Current goals can be found in the care plan section) Acute Rehab OT Goals Patient Stated Goal: to go home OT Goal Formulation: With patient Time For Goal Achievement: 12/04/16 Potential to Achieve Goals: Fair  OT Frequency:     Barriers to D/C:            Co-evaluation              End of Session Equipment Utilized During Treatment: Oxygen Nurse Communication: Mobility status  Activity Tolerance: Patient limited by fatigue;Patient limited by pain Patient left: in bed;with call bell/phone within reach   Time: [id number] OT Time Calculation (min): 31 min Charges:   OT General Charges $OT Visit: 1 Procedure OT Evaluation $OT Eval Moderate Complexity: 1 Procedure OT Treatments $Self Care/Home Management : 8-22 mins G-Codes: OT G-codes **NOT FOR INPATIENT CLASS** Functional Assessment Tool Used: Clinical Judgement Functional Limitation: Self care Self Care Current Status [id number]): At least 60 percent but less than 80 percent impaired, limited or restricted Self Care Goal Status [id number]): At least 20 percent but less than 40 percent impaired, limited or restricted  Jaci Carrel 11/27/2016, 5:43 PM Hulda Humphrey OTR/L [phone]

## 2016-11-27 NOTE — Consult Note (Signed)
[facility] Nurse wound consult note Reason for Consult: WOC assistance requested for use of Interdry.  Skin folds to abd are red, macerated, and moist with partial thickness skin loss.  Appearance is consistent with intertrigo.  Interdry silver-impregnated fabric ordered for use by bedside nurses and instructions provided.  This product should remain in place for 5 days for optimal plan of care to provide antimicrobial benefits and wick moisture away from skin.   Wound type: BLE with generalized lymphademia.  Pt is familiar to the [facility] team from previous admissions and has worn Una boots in the past.  She states she has not had to wear them recently.  Her right leg developed cellulitis and weeping this week. Measurement: No open wounds.  Left ankle has protruding soft area which extends above the skin level; pt states this is not a blister and is a chronic condition. Bilat legs with dry crusted skin, right leg with increased amt erythremia and mod amt yellow drainage, no odor. Dressing procedure/placement/frequency: Eucerin cream to treat dry cracked skin. Please re-consult if further assistance is needed.  Thank-you,  Julien Girt MSN, [place], [place], [place], [place]

## 2016-11-28 ENCOUNTER — Other Ambulatory Visit: Payer: Self-pay | Admitting: *Deleted

## 2016-11-28 ENCOUNTER — Encounter: Payer: Self-pay | Admitting: *Deleted

## 2016-11-28 ENCOUNTER — Other Ambulatory Visit: Payer: Self-pay | Admitting: Internal Medicine

## 2016-11-28 ENCOUNTER — Telehealth: Payer: Self-pay | Admitting: *Deleted

## 2016-11-28 MED ORDER — OXYCODONE-ACETAMINOPHEN 10-325 MG PO TABS: 1.0000 | ORAL_TABLET | Freq: Four times a day (QID) | ORAL | 0 refills | Status: DC | PRN

## 2016-11-28 MED ORDER — ATORVASTATIN CALCIUM 10 MG PO TABS: 10.0000 mg | ORAL_TABLET | Freq: Every day | ORAL | 0 refills | Status: DC

## 2016-11-28 MED ORDER — PREGABALIN 150 MG PO CAPS: 150.0000 mg | ORAL_CAPSULE | Freq: Two times a day (BID) | ORAL | 0 refills | Status: DC

## 2016-11-28 NOTE — Patient Outreach (Addendum)
[facility]) Care Management  11/28/2016  EISELE DOWNHAM 07/25/1961 [id number]   Noted that member was discharged from hospital (observation status only) yesterday.  Call placed to member to follow up on discharge.  This care manager introduced self, [facility] care management services explained.  Member state that she is still in the bed since arriving home, has not tried to get up.  She discussed her limited mobility, stating that she used to be able to walk with her walker, but is not able to as much not, needing much assistance to stand and even get out of bed.  She state that this, along with her weight (she is unable to get down her stairs nor is she able to fit in the car) are the reasons she has not been seen at her primary MD office.  Her last office visit was in June.    It is known that she and her brother are caregivers to each other, however her brother provides the most support for her.  She state that he would drive her to all her appointments, provide meals (mostly microwavable), among other ADLs/IADLs.  During conversation, member calls brother for assistance several times, even to provide sips of water.  Brother will start treatment for cancer today, they are unsure how this will affect him.  He has also expressed concern regarding the level of care that she need, but member refuses to be placed as she will have copay.  She state she is unable to afford it.  She denies applying for Medicaid because she feel she will have to give up her check, and she is fearful that her brother will not be able to live from his check alone as they live together.  She is willing to discuss options with Education officer, museum (referral already placed).    She is aware that she has a follow up appointment scheduled for 12/27 @ 0930, but state she will have to cancel.  She state she does not have transportation, and that the time is too early.  Option of using [facility] for transport versus having physicians  making house calls come to the home.  She is aware that this will be an additional cost, she state she is not able to pay.  She report that she will call her insurance company to have her plan changed to cover transportation.  She is advised that typically, standard transportation is provided for some plans, but only for a set number of trips.  She is also advised that she would need more than standard transportation due to her current condition.  She is adamant that she will be able to change to a policy where her transportation is fully covered, and state that she will still call.  This care manager offers member home visit next week, (only opening on schedule is a morning appointment), member declines stating that she is not out of the bed until around 11/12 and that she will need time to prepare.  She state that she has not received a call from [facility] to initiate services.  Advised that she should receive a call from them at least by tomorrow.  She denies any other concerns, will follow up next week to confirm services have started.  Call then placed to [name], [place], in effort to obtain assigned nurse information to collaborate plan of care.  Voice message left, will await call back.  Leadership team notified of difficult case.  Will discuss  next week during difficult case discussion.  Will develop plan of care at that time, after recommendations are made.  [name], [place], MSN [place] [phone]

## 2016-11-28 NOTE — Discharge Summary (Signed)
[facility] Discharge Summary  [name] Medical record number: [id number] Date of birth: Jun 18, 1961 Age: 55 y.o. Gender: female Date of Admission: 11/26/2016  Date of Discharge: 11/27/16 Admitting Physician: Lind Covert, MD  Primary Care Provider: Evette Doffing, MD Consultants: wound care, PT, OT, Case management  Indication for Hospitalization: cellulitis  Discharge Diagnoses/Problem List:  Cellulitis Morbid obesity  Disposition: home with home health RN, aide, PT, OT, speech therapy  Discharge Condition: stable  Discharge Exam: see progress note from 11/27/16  Brief Hospital Course:  Kathleen Roy is a 55 y.o. female with PMH significant for morbid obesity, chronic cellulitis, HFpEF, Depression, generalized weakness who presented to [facility] ED with worsening cellulitis. She was admitted and received a dose of Ancef in the ED. She was continued on IV ceftriaxone for one dose before being transitioned to oral doxycycline for a 10 day course. She has a complicated social history and is unable to care for herself at home. She was seen by wound care, PT, OT, and case management to help set up resources going forward. She was discharged home on 12/13 with home health PT, OT, RN, aide, and speech therapy. She has close follow up with her PCP.    Issues for Follow Up:  1. Follow up on chronic cellulitis, discharged home with 10 day course of doyxycyline 2. Ensure patient has home health needs addressed  Significant Procedures: none  Significant Labs and Imaging:   Recent Labs Lab 11/26/16 1454 11/27/16 0521 11/27/16 1522  WBC 9.4 7.4 6.2  HGB 9.4* 8.0* 8.3*  HCT 31.5* 27.4* 28.5*  PLT 191 153 159    Recent Labs Lab 11/24/16 2203 11/26/16 1454 11/27/16 0521  NA 140 142 141  K 3.7 3.8 3.6  CL 90* 93* 93*  CO2 40* 39* 39*  GLUCOSE 108* 108* 117*  BUN 15 13 12   CREATININE 0.84 0.99 0.83  CALCIUM 9.3 9.5 9.0   ALKPHOS 81 73  --   AST 21 33  --   ALT 13* 25  --   ALBUMIN 4.0 3.2*  --     Results/Tests Pending at Time of Discharge: final blood culture results  Discharge Medications:    Medication List    TAKE these medications   albuterol 108 (90 Base) MCG/ACT inhaler Commonly known as:  PROVENTIL HFA;VENTOLIN HFA Inhale 2 puffs into the lungs every 4 (four) hours as needed for wheezing or shortness of breath.   ARIPiprazole 2 MG tablet Commonly known as:  ABILIFY take 1 tablet by mouth every other day What changed:  See the new instructions.   atorvastatin 10 MG tablet Commonly known as:  LIPITOR Take 10 mg by mouth at bedtime.   BREO ELLIPTA 100-25 MCG/INH Aepb Generic drug:  fluticasone furoate-vilanterol Inhale 1 puff into the lungs daily as needed (for SOB).   cetirizine 10 MG tablet Commonly known as:  ZYRTEC Take 10 mg by mouth daily.   cyclobenzaprine 10 MG tablet Commonly known as:  FLEXERIL Take 1 tablet (10 mg total) by mouth 3 (three) times daily as needed for muscle spasms.   doxycycline 100 MG tablet Commonly known as:  VIBRA-TABS Take 1 tablet (100 mg total) by mouth 2 (two) times daily.   EQ DAILY FIBER PO Take 1 capsule by mouth daily.   Fish Oil 1000 MG Caps Take 2 capsules (2,000 mg total) by mouth at bedtime.   glipiZIDE 5 MG tablet Commonly known as:  GLUCOTROL take 1 tablet by mouth once daily   meclizine 25 MG tablet Commonly known as:  ANTIVERT Take 25 mg by mouth every 6 (six) hours as needed for dizziness.   nystatin powder Commonly known as:  MYCOSTATIN/NYSTOP Apply topically 2 (two) times daily. Apply to affected skin BID What changed:  when to take this  reasons to take this  additional instructions   oxyCODONE-acetaminophen 10-325 MG tablet Commonly known as:  PERCOCET Take 1 tablet by mouth every 6 (six) hours as needed for pain.   OXYGEN Inhale 4 L into the lungs continuous. 4l/min   polyethylene glycol  packet Commonly known as:  MIRALAX / GLYCOLAX Take 17 g by mouth daily as needed for mild constipation.   potassium chloride SA 20 MEQ tablet Commonly known as:  K-DUR,KLOR-CON Take 2 tablets (40 mEq total) by mouth daily.   pregabalin 150 MG capsule Commonly known as:  LYRICA Take 1 capsule (150 mg total) by mouth 2 (two) times daily.   torsemide 20 MG tablet Commonly known as:  DEMADEX Take 40 mg by mouth in the AM and 20 mg by mouth in the PM. What changed:  how much to take  how to take this  when to take this  additional instructions   traZODone 150 MG tablet Commonly known as:  DESYREL Take 2 tablets (300 mg total) by mouth at bedtime.   venlafaxine XR 150 MG 24 hr capsule Commonly known as:  EFFEXOR-XR Take 2 capsules (300 mg total) by mouth daily.   vitamin C 1000 MG tablet Take 3,000 mg by mouth at bedtime.       Discharge Instructions: Please refer to Patient Instructions section of EMR for full details.  Patient was counseled important signs and symptoms that should prompt return to medical care, changes in medications, dietary instructions, activity restrictions, and follow up appointments.   Follow-Up Appointments: Follow-up Information    Evette Doffing, MD. Go on 12/11/2016.   Specialty:  Family Medicine Why:  9:30 am Contact information: [place] [address] [phone]           Steve Rattler, DO 11/28/2016, 8:57 AM PGY-1, [place]

## 2016-11-28 NOTE — Telephone Encounter (Signed)
Spoke with patient on the phone. She is requesting refills of Lyrica, Lipitor, and Percocet. Refills prescribed. Patient is aware.

## 2016-11-28 NOTE — Telephone Encounter (Signed)
Pt calling in wanting to to talk to dr Brett Albino about medications. Please call pt back. Deseree Kennon Holter, CMA

## 2016-11-29 ENCOUNTER — Other Ambulatory Visit: Payer: Self-pay | Admitting: *Deleted

## 2016-11-29 ENCOUNTER — Telehealth: Payer: Self-pay | Admitting: Licensed Clinical Social Worker

## 2016-11-29 NOTE — Telephone Encounter (Signed)
Clinical info completed on CMN form.  Place form in Dr. Velia Meyer box for completion.  Kathleen Roy, [place]

## 2016-11-29 NOTE — Patient Outreach (Signed)
[place] [facility]) Care Management  11/29/2016  Kathleen Roy 09/03/1961 [id number]   Call received back from [name] with [facility], notified that member will have first home visit today with [name], [place].  Contact information received for nurse, call placed to collaborate on plan of care.  No answer, HIPAA compliant voice message left.  Will await call back.  [name], [place], MSN [place] [phone]

## 2016-11-29 NOTE — Progress Notes (Signed)
Received call from patient.  Informed [name] [facility] came to see her today. She was informed they are unable to provide services for her.  States patient needs a higher level of care.  [name] discussed long-term care options with patient.  She is not interested in LTC states If her income is used for LTC her brother will not have enough money to pay rent with his income. Patient unsure of what she will do. Informed patient she has been assigned an Therapist, sports and [name] with [facility].  Someone would contact her within the next weeks.   Casimer Lanius, [name] Licensed Clinical Social Worker [place]   [phone] 3:11 PM

## 2016-11-29 NOTE — Telephone Encounter (Signed)
Brother don brought in paperwork to be completed by dr Brett Albino. They are hoping to receive some money back on a lift chair they purchased.  Please call don when form is ready to be picked up

## 2016-11-29 NOTE — Patient Outreach (Signed)
[facility]) Care Management  11/29/2016  Kathleen Roy 1961/08/31 [id number]  CSW received a new referral on patient from [facility] Liaison with [facility]) Care Management, indicating that patient would benefit from social work services and resources to assist with referrals to various community agencies.  Ms. [place] Crane went on to say that patient was recently hospitalized, declining short-term skilled nursing placement for rehabilitative services, at time of discharge, despite recommendations otherwise. CSW received an update from patient's RNCM, [name], also with [facility], reporting that she was able to discuss patient's complex case with a colleague, who encouraged Mrs. Lane and CSW to angle patient's case a different way.  In working with patient, the focus should remain on how patient can be of assistance to her brother, currently undergoing treatment for cancer, or even how patient will be able to maintain stability for herself, in the event that her brother does not respond well to treatment.  Mrs. Orene Desanctis reported that patient is very dependent upon her brother and that they are currently living under the same roof.  Mrs. Orene Desanctis encouraged CSW to assist patient with completion of an Adult Medicaid application through the [place], as patient is now agreeable to receiving government assistance.  CSW will converse with patient to determine whether or not she is eligible to receive home care services through [facility]. CSW made an initial attempt to try and contact patient today to perform phone assessment, as well as assess and assist with social needs and services, without success.  A HIPAA complaint message was left for patient on voicemail.  CSW is currently awaiting a return call.  CSW will make a second outreach attempt within the next week, if CSW does not receive a return call from patient in the meantime. [name], [name], [name], [name]  Licensed Education officer, environmental Health System  Mailing [place] [address], [place], [place] [address] Physical Address-[address], [place], [place] [address] Toll Free Main # [phone] Fax # [phone] Cell # [phone]  Fax # [phone]  [name]

## 2016-11-30 LAB — CULTURE, BLOOD (ROUTINE X 2)
Culture: NO GROWTH
Culture: NO GROWTH

## 2016-12-01 LAB — CULTURE, BLOOD (ROUTINE X 2)
Culture: NO GROWTH
Culture: NO GROWTH

## 2016-12-02 ENCOUNTER — Other Ambulatory Visit: Payer: Self-pay | Admitting: *Deleted

## 2016-12-02 ENCOUNTER — Encounter: Payer: Self-pay | Admitting: *Deleted

## 2016-12-02 NOTE — Patient Outreach (Signed)
[facility]) Care Management  12/02/2016  Kathleen Roy 03/16/1961 [id number]   Call placed to member to follow up on visit from [place].  She report that they were unable to provide services due to the level of care she required.  Member state "I feel like she was just saying that I was too needy."  Member reminded that it was recommended while she was in the emergency department and the hospital for her to be discharged to a rehab/SNF but she refused.  She agree that she does need daily assistance, even to stand/get out of bed.  She is advised that home health is not able to provide that level of care.  She state that the nurse asked her what would she do if she needed to get up for something and her brother was not able to help her.  She responded "I don't know what I would do."    This care manager again offered a home visit for this week, afternoon as she requested, she state that the times conflict with her brother's treatment and she will not be able to open the door.  She state that he will receive treatment daily for the next 6 weeks (chemo and radiation).  She state that he was not feeling well over the weekend, but he said he was ok today.    Discussed member's scheduled follow up appointment for next week again, she still report that she will change it due to the time.  Discussed the possibility of [facility] assisting with transportation, she state that she will still need assistance getting out of the home, down the steps.  She will not be able to get up out of bed and dressed if her brother is not there to help.    She is made aware of the [name] attempt to contact her last week, provided with contact information.  Advised to call as soon as possible to discuss application for Medicaid and other resources.  She verbalizes understanding.  Will discuss with leadership as difficult case this week, will follow up with member after collaboration.  [name], [place],  MSN [place] [phone]

## 2016-12-03 ENCOUNTER — Other Ambulatory Visit: Payer: Self-pay | Admitting: *Deleted

## 2016-12-03 NOTE — Patient Outreach (Signed)
[facility]) Care Management  12/03/2016  MAKAYIA GIUFFRIDA 1961-07-02 [id number]   Discussed complexity of case with [facility] clinical assistant director for collaboration of care.  Since [place] [facility] is not able to provide in home services, recommendation made for referral to Homecare Providers. Call placed to member to discuss their role, she agrees to referral.  Call placed to Homecare providers, HIPAA compliant voice message left.  Will await call back to place referral.  Member again made aware of the importance of follow up appointment with primary MD.  Discussed [facility] providing transportation as well as having Homecare providers come to help member with getting ready for appointment.  She continue to state the scheduled appointment is too early, stating "I will have to get up at like 4 or 5 in order to be ready for an appointment that early."  She and her brother also continue to express concern regarding getting the member out of the home and to the vehicle for transport.  Again discussed the role of Homecare providers, coming in to assist member with getting dressed and [facility] providing medical transport, however she remains adamant about rescheduling.  Advised to try to reschedule appointment for no later than next week since she was recently discharged.  She verbalizes understanding and state she will call today and call this care manager back with the new appointment time.   Update @ 1630:  Call received back from member stating that her new appointment is for 1/11 at 4pm.  She state that the best time that she and her brother agreed on was at 21 and this the earliest appointment available that he would be able to attend.  Member reminded that Homecare providers would potentially be able to provide assistance with prep for appointment (pending approval for services), [facility] would provide transportation, and this care manager offered to accompany member to appointment.  She is made  aware that her brother would not have to be present for assistance, however she state that she would still need him available to accompany her to the office, and that she would need him to open the door to allow anyone to come in.  She again state that this was the earliest appointment available for the time that was best for the both of them.  This care manager will provide update to clinical supervisor as well as present case as difficult case discussion as planned on Thursday.  Will follow up with member within a week.  [name], [place], MSN [place] [phone]

## 2016-12-04 ENCOUNTER — Other Ambulatory Visit: Payer: Self-pay | Admitting: *Deleted

## 2016-12-04 ENCOUNTER — Telehealth: Payer: Self-pay | Admitting: *Deleted

## 2016-12-04 NOTE — Patient Outreach (Signed)
[facility]) Care Management  12/04/2016  BYRNECE SCHEIBNER [birth year] [id number]   CSW made a second attempt to try and contact patient today to perform phone assessment, as well as assess and assist with social work needs and services, without success.  A HIPAA compliant message was left on voicemail.  CSW is currently awaiting a return call.  CSW will make a third and final outreach attempt within the next week, if CSW does not receive a return call from patient in the meantime. [name], [name], [name], [name]  Licensed Education officer, environmental Health System  Mailing [place] [address], [place], [place] [address] Physical Address-[address], [place], [place] [address] Toll Free Main # [phone] Fax # [phone] Cell # [phone]  Fax # [phone]  [name]

## 2016-12-04 NOTE — Telephone Encounter (Signed)
Patient states she was started on doxycycline during her hospitalization for cellulitis and is now nearly out. Wants to know if MD thinks she should continue this or not?

## 2016-12-05 ENCOUNTER — Other Ambulatory Visit: Payer: Self-pay | Admitting: *Deleted

## 2016-12-05 NOTE — Telephone Encounter (Signed)
Please let [name] know I am out of the office today. I will give her a call tomorrow to discuss the Doxycycline and I will also have her form filled out tomorrow for [facility] to pick up. Thank you!

## 2016-12-05 NOTE — Patient Outreach (Signed)
[facility]) Care Management  12/05/2016  HALENA BLACKWATER 05/05/1961 [id number]  CSW received a return call from patient last evening; however, CSW was not available at the time of the call.  Patient left a message on CSW's voicemail.  CSW attempted to return patient's call today, but patient was unavailable. CSW left a HIPAA compliant message and is currently awaiting a return call.  If CSW does not receive a return call from patient in the meantime, CSW will make a third and final outreach attempt to patient in one week. [name], [name], [name], [name]  Licensed Education officer, environmental Health System  Mailing [place] [address], [place], [place] [address] Physical Address-[address], [place], [place] [address] Toll Free Main # [phone] Fax # [phone] Cell # [phone]  Fax # [phone]  [email or web address]

## 2016-12-05 NOTE — Telephone Encounter (Signed)
Spoke with brother and he will relay message to patient. Ahmarion Saraceno,CMA

## 2016-12-06 ENCOUNTER — Other Ambulatory Visit: Payer: Self-pay | Admitting: *Deleted

## 2016-12-06 NOTE — Telephone Encounter (Signed)
Spoke with patient on the phone. Will hold off on Doxycycline for now and see if her chronic leg cellulitis worsens. She has an appointment scheduled on 12/26/16, so I will address this with her when I see her.

## 2016-12-06 NOTE — Patient Outreach (Signed)
[place] [facility]) Care Management  12/06/2016  Kathleen Roy 12-27-1960 [id number]   CSW made a third attempt to try and contact patient today to perform phone assessment, as well as assess and assist with social work needs and services, without success.  A HIPAA compliant message was left for patient on voicemail.  CSW is currently awaiting a return call.  CSW will make an additional outreach attempt within the next week, if CSW does not receive a return call from patient in the meantime. [name], [name], [name], [name]  Licensed Education officer, environmental Health System  Mailing [place] [address], [place], [place] [address] Physical Address-[address], [place], [place] [address] Toll Free Main # [phone] Fax # [phone] Cell # [phone]  Fax # [phone]  [name]

## 2016-12-06 NOTE — Telephone Encounter (Signed)
Form completed and given to [place].

## 2016-12-06 NOTE — Patient Outreach (Signed)
[facility]) Care Management  12/06/2016  Kathleen Roy 08-24-1961 [id number]   No call received back from [facility] after message left on Tuesday.  Call placed again today for referral.  Spoke to April, she state that due to the holiday, member will not be able to be assessed until next week.  She request this care manager (as she is under the weather and not in the office) to call back on Tuesday to provide more information regarding referral.  Will follow up next week.  [name], [place], MSN [place] [phone]

## 2016-12-06 NOTE — Telephone Encounter (Signed)
Form placed up front for pick up.  Derl Barrow, RN

## 2016-12-10 ENCOUNTER — Other Ambulatory Visit: Payer: Self-pay | Admitting: *Deleted

## 2016-12-10 ENCOUNTER — Ambulatory Visit: Payer: Self-pay | Admitting: *Deleted

## 2016-12-10 NOTE — Patient Outreach (Signed)
[facility] [facility]) Care Management  12/10/2016  CELSA FROGGE [birth year] [id number]   Call placed to [facility], spoke with April.  Detailed information provided to place referral to have assessment to provide care for member.  Will follow up with member within the next 2 days to confirm she has been contacted and assessment is done.  [name], [place], MSN [place] [phone]

## 2016-12-10 NOTE — Patient Outreach (Signed)
[facility]) Care Management  12/10/2016  Kathleen Roy July 29, 1961 [id number]   CSW continues to try and make initial contact with patient to perform phone assessment, as well as assess and assist with social work needs and services, without success. Another HIPAA compliant message was left for patient on voicemail today.  CSW has also notified patient's RNCM with [facility], [name] of CSW's inability to make initial contact with patient, despite 4 previous phone attempts.  CSW will make another attempt in one week, if CSW does not receive a return call from patient in the meantime. [name], [name], [name], [name]  Licensed Education officer, environmental Health System  Mailing [place] [address] [place], [place] [address] Physical Address-[address], [place], [place] [address] Toll Free Main # [phone] Fax # [phone] Cell # [phone]  Fax # [phone]  [email or web address]

## 2016-12-11 ENCOUNTER — Inpatient Hospital Stay: Payer: Medicare Other | Admitting: Internal Medicine

## 2016-12-12 ENCOUNTER — Other Ambulatory Visit: Payer: Self-pay | Admitting: *Deleted

## 2016-12-12 NOTE — Patient Outreach (Signed)
[facility]) Care Management  12/12/2016  HEAVENLEIGH ELEFANTE 10/04/1961 [id number]   Call placed to member to follow up on contact with [facility].  She state that she has been contacted and that she has an appointment tomorrow for an initial assessment.  She denies hearing from Mrs. Saporito, [name], denies receiving any voice messages.  She is advised to contact her again to follow up on community resources.  She verbalizes understanding.  Attempt made to schedule home visit for next week, she state that she is unable to confirm a time due to her brother not being available to provide his schedule for the week (cancer treatments).  She state that she will call this care manager back once she know a time that would be best.  She is made aware that this care manager is allowing her to choose the time of her visit on Thursday, 1/4 between 9 am - 4 pm), as it has been difficult to schedule visit.  Will await call back from member.  [name], [place], MSN [place] [phone]

## 2016-12-14 ENCOUNTER — Encounter (HOSPITAL_COMMUNITY): Payer: Self-pay | Admitting: Emergency Medicine

## 2016-12-14 ENCOUNTER — Emergency Department (HOSPITAL_COMMUNITY)
Admission: EM | Admit: 2016-12-14 | Discharge: 2016-12-14 | Disposition: A | Discharge status: Home / Self Care | Payer: Medicare Other | Attending: Emergency Medicine | Admitting: Emergency Medicine

## 2016-12-14 ENCOUNTER — Emergency Department (HOSPITAL_COMMUNITY): Payer: Medicare Other

## 2016-12-14 DIAGNOSIS — B369 Superficial mycosis, unspecified: Secondary | ICD-10-CM | POA: Diagnosis not present

## 2016-12-14 DIAGNOSIS — I110 Hypertensive heart disease with heart failure: Secondary | ICD-10-CM | POA: Diagnosis not present

## 2016-12-14 DIAGNOSIS — L03311 Cellulitis of abdominal wall: Secondary | ICD-10-CM

## 2016-12-14 DIAGNOSIS — K5900 Constipation, unspecified: Secondary | ICD-10-CM | POA: Diagnosis not present

## 2016-12-14 DIAGNOSIS — Z87891 Personal history of nicotine dependence: Secondary | ICD-10-CM | POA: Diagnosis not present

## 2016-12-14 DIAGNOSIS — J449 Chronic obstructive pulmonary disease, unspecified: Secondary | ICD-10-CM | POA: Insufficient documentation

## 2016-12-14 DIAGNOSIS — I5032 Chronic diastolic (congestive) heart failure: Secondary | ICD-10-CM | POA: Insufficient documentation

## 2016-12-14 DIAGNOSIS — Z7984 Long term (current) use of oral hypoglycemic drugs: Secondary | ICD-10-CM | POA: Diagnosis not present

## 2016-12-14 DIAGNOSIS — R1031 Right lower quadrant pain: Secondary | ICD-10-CM | POA: Diagnosis not present

## 2016-12-14 DIAGNOSIS — R1084 Generalized abdominal pain: Secondary | ICD-10-CM

## 2016-12-14 DIAGNOSIS — E1140 Type 2 diabetes mellitus with diabetic neuropathy, unspecified: Secondary | ICD-10-CM | POA: Insufficient documentation

## 2016-12-14 DIAGNOSIS — R109 Unspecified abdominal pain: Secondary | ICD-10-CM | POA: Diagnosis not present

## 2016-12-14 LAB — COMPREHENSIVE METABOLIC PANEL
ALT: 10 U/L — ABNORMAL LOW (ref 14–54)
AST: 12 U/L — ABNORMAL LOW (ref 15–41)
Albumin: 3.1 g/dL — ABNORMAL LOW (ref 3.5–5.0)
Alkaline Phosphatase: 71 U/L (ref 38–126)
Anion gap: 8 (ref 5–15)
BUN: 11 mg/dL (ref 6–20)
CHLORIDE: 92 mmol/L — AB (ref 101–111)
CO2: 44 mmol/L — ABNORMAL HIGH (ref 22–32)
CREATININE: 0.83 mg/dL (ref 0.44–1.00)
Calcium: 9.8 mg/dL (ref 8.9–10.3)
Glucose, Bld: 117 mg/dL — ABNORMAL HIGH (ref 65–99)
POTASSIUM: 4.2 mmol/L (ref 3.5–5.1)
Sodium: 144 mmol/L (ref 135–145)
Total Bilirubin: 0.8 mg/dL (ref 0.3–1.2)
Total Protein: 6.9 g/dL (ref 6.5–8.1)

## 2016-12-14 LAB — CBC WITH DIFFERENTIAL/PLATELET
Basophils Absolute: 0 10*3/uL (ref 0.0–0.1)
Basophils Relative: 0 %
EOS ABS: 0 10*3/uL (ref 0.0–0.7)
Eosinophils Relative: 0 %
HCT: 32.2 % — ABNORMAL LOW (ref 36.0–46.0)
HEMOGLOBIN: 9.1 g/dL — AB (ref 12.0–15.0)
LYMPHS ABS: 0.9 10*3/uL (ref 0.7–4.0)
Lymphocytes Relative: 19 %
MCH: 28.3 pg (ref 26.0–34.0)
MCHC: 28.3 g/dL — ABNORMAL LOW (ref 30.0–36.0)
MCV: 100 fL (ref 78.0–100.0)
Monocytes Absolute: 0.3 10*3/uL (ref 0.1–1.0)
Monocytes Relative: 6 %
NEUTROS PCT: 75 %
Neutro Abs: 3.3 10*3/uL (ref 1.7–7.7)
Platelets: 148 10*3/uL — ABNORMAL LOW (ref 150–400)
RBC: 3.22 MIL/uL — AB (ref 3.87–5.11)
RDW: 17.4 % — ABNORMAL HIGH (ref 11.5–15.5)
WBC: 4.5 10*3/uL (ref 4.0–10.5)

## 2016-12-14 LAB — I-STAT CHEM 8, ED
BUN: 13 mg/dL (ref 6–20)
Calcium, Ion: 1.26 mmol/L (ref 1.15–1.40)
Chloride: 89 mmol/L — ABNORMAL LOW (ref 101–111)
Creatinine, Ser: 1.1 mg/dL — ABNORMAL HIGH (ref 0.44–1.00)
Glucose, Bld: 115 mg/dL — ABNORMAL HIGH (ref 65–99)
HEMATOCRIT: 29 % — AB (ref 36.0–46.0)
HEMOGLOBIN: 9.9 g/dL — AB (ref 12.0–15.0)
Potassium: 3.9 mmol/L (ref 3.5–5.1)
SODIUM: 142 mmol/L (ref 135–145)
TCO2: 47 mmol/L (ref 0–100)

## 2016-12-14 LAB — I-STAT BETA HCG BLOOD, ED (MC, WL, AP ONLY)

## 2016-12-14 LAB — LIPASE, BLOOD: LIPASE: 12 U/L (ref 11–51)

## 2016-12-14 MED ORDER — CEPHALEXIN 500 MG PO CAPS: 500.0000 mg | ORAL_CAPSULE | Freq: Four times a day (QID) | ORAL | 0 refills | Status: DC

## 2016-12-14 NOTE — Discharge Instructions (Signed)
It was our pleasure to provide your ER care today - we hope that you feel better.  Keep the area in skin folds/creases very clean and dry - wash with mild soap and water 2x/day and then dry completely (you may use hair dryer on cool/warm setting to aide with drying process).  Then apply a very thin coat of clotrimazole cream.   Take antibiotic as prescribed.  Follow up with primary care doctor in the coming week.  Return to ER if worse, new symptoms, high fevers, persistent vomiting, other medical emergency.

## 2016-12-14 NOTE — Progress Notes (Signed)
CSW engaged with Patient at her bedside. CSW familiar with Patient from previous admissions/ED visits. Patient remains to have SNF days (but these are co-pay days, 160.00/day, that she refuses to pay and reports she cannot pay. Several attempts have been made by various outpatient CSW's/SNFs to assist patient in applying for Medicaid, however patient refuses reporting "I do not want to give up the check". Patient reports that she is not interested in [place] and states If her income is used for long term care, her brother will not have enough money to pay rent with his income.  Patient has been assigned an RN and [name] with [facility] however, per records, Patient has yet to follow up with [name] for initial contact after 4 previous phone attempts by [name]. This CSW has contacted [name] with [facility] and left HIPAA compliant message informing of Patient's status in the ED.   CSW has encouraged Patient to please follow up with [facility] social worker and RN case Freight forwarder for outpatient resources. CSW provided Patient with contact information [name] on AVS.

## 2016-12-14 NOTE — ED Notes (Signed)
Pt moved from ED stretcher to [place]. Pt agrees to plan for pt to be transported home

## 2016-12-14 NOTE — ED Notes (Signed)
Discussed d/c with pt and explained [facility] would transport pt home. Pt verbalized understanding. When [facility] arrived, pt stated that she would call 911 when she arrived at home. Dr. Ashok Cordia aware and state no other testing needs to be done at this time.

## 2016-12-14 NOTE — ED Provider Notes (Signed)
Dr Kathrynn Humble had signed out to d/c to home if/when CT abd neg for acute process  - he indicated possible early abd wall cellulitis, and to give rx for home.  Patients skin creases/panus is moist, and in creases skin erythematous, appearing most c/w yeast/fungal, but also w possibly mild superimposed cellulitis.   Pt afeb. No chills/sweats.   Patient requests I also look at the back of her upper leg - area visualized/palpated, no discrete lesions/rash noted. No sacral/decubitus ulcer.   Rec close pcp f/u.     Lajean Saver, MD 12/14/16 [phone]

## 2016-12-14 NOTE — ED Provider Notes (Signed)
[facility] DEPT Provider Note   CSN: [id number] Arrival date & time: 12/14/16  [id number]     History   Chief Complaint Chief Complaint  Patient presents with  . Constipation    HPI Kathleen Roy is a 54 y.o. female.  HPI Pt with hx of COPD and DM comes in with cc of abd pain. Abd pain is R sided and constant for several days. Pain is worse with ambulation/ movement. There is no associated n/v/f/c. Pt has no diarrhea. Pt also feeling constipated, but she had a BM en route to the ER.   Past Medical History:  Diagnosis Date  . Arthritis    "I feel like it's everywhere" (11/08/2014)  . Cellulitis and abscess of lower extremity 08/16/2016   right leg  . Chronic lower back pain   . COPD (chronic obstructive pulmonary disease) ([place])   . Depression   . Diabetes mellitus without complication ([place])    type 2   . Fibromyalgia   . Headache    "at least weekly; it's usually when I first wake up" (11/08/2014)  . Hyperlipidemia   . Hypertension   . HYPERTENSION, BENIGN SYSTEMIC 02/12/2007  . Memory disturbance   . Migraines 1970's - <2000   "they just went away"  . Neuropathy ([place])   . Normal echocardiogram 05/30/05   suboptimal study  . Obesity, morbid (more than 100 lbs over ideal weight or BMI > 40) (HCC)    obese since childhood  . Osteoarthritis   . Peripheral edema   . Pressure ulcer of foot    left  . Sepsis ([place])   . Shortness of breath dyspnea   . Sleep apnea    "recently dx'd; haven't got my equipment yet" (11/08/2014)    Patient Active Problem List   Diagnosis Date Noted  . Cellulitis 11/26/2016  . Cough   . Insomnia   . Palliative care by specialist   . Advance care planning   . Chronic pain of both knees   . Acute respiratory failure with hypoxia and hypercarbia ([place]) 10/15/2016  . General weakness   . Hypoxia 10/14/2016  . Bacteremia   . Cellulitis of right lower extremity 06/04/2016  . Type 2 diabetes mellitus ([place]) 02/14/2016  . Pressure ulcer  01/16/2016  . Acute on chronic respiratory failure with hypercapnia ([place])   . Chronic diastolic congestive heart failure ([place])   . Essential hypertension   . CAP (community acquired pneumonia) 01/15/2016  . Bilateral knee pain 03/22/2015  . OSA (obstructive sleep apnea)   . Obesity hypoventilation syndrome ([place])   . Chronic respiratory failure with hypoxia ([place])   . Assistance needed for grooming 11/08/2014  . Fatigue 11/03/2014  . Memory loss 03/18/2014  . Tremor 03/18/2014  . Vulvovaginal candidiasis 11/25/2013  . Restrictive airway disease 08/26/2013  . Hypokalemia 08/19/2013  . Health care maintenance 06/17/2013  . Venous stasis dermatitis of both lower extremities 12/02/2012  . COPD (chronic obstructive pulmonary disease) ([place]) 10/15/2011  . Peripheral neuropathy ([place]) 10/15/2011  . KNEE PAIN, BILATERAL 01/22/2011  . BACK PAIN, LUMBAR, CHRONIC 01/22/2011  . MIGRAINE HEADACHE 10/25/2010  . HYPERLIPIDEMIA 04/07/2009  . EDEMA, CHRONIC 06/27/2008  . Morbid obesity ([place]) 11/16/2007  . DEPRESSION, MAJOR, RECURRENT 02/12/2007  . PANIC ATTACKS 02/12/2007  . HYPERTENSION, BENIGN SYSTEMIC 02/12/2007    Past Surgical History:  Procedure Laterality Date  . ABDOMINAL HERNIA REPAIR  2002  . APPENDECTOMY  1995  . CHOLECYSTECTOMY  1995  . TOTAL ABDOMINAL HYSTERECTOMY  1994    OB History    Gravida Para Term Preterm AB Living   0 0 0 0 0 0   SAB TAB Ectopic Multiple Live Births   0 0 0 0         Home Medications    Prior to Admission medications   Medication Sig Start Date End Date Taking? Authorizing Provider  albuterol (PROVENTIL HFA;VENTOLIN HFA) 108 (90 Base) MCG/ACT inhaler Inhale 2 puffs into the lungs every 4 (four) hours as needed for wheezing or shortness of breath. 10/17/16   Lovenia Kim, MD  ARIPiprazole (ABILIFY) 2 MG tablet take 1 tablet by mouth every other day Patient taking differently: Take 2 mg by mouth every other day 03/08/16   Sela Hua, MD    Ascorbic Acid (VITAMIN C) 1000 MG tablet Take 3,000 mg by mouth at bedtime.     Historical Provider, MD  atorvastatin (LIPITOR) 10 MG tablet Take 1 tablet (10 mg total) by mouth at bedtime. 11/28/16   Sela Hua, MD  cetirizine (ZYRTEC) 10 MG tablet Take 10 mg by mouth daily.    Historical Provider, MD  cyclobenzaprine (FLEXERIL) 10 MG tablet Take 1 tablet (10 mg total) by mouth 3 (three) times daily as needed for muscle spasms. 06/17/16   Sela Hua, MD  fluticasone furoate-vilanterol (BREO ELLIPTA) 100-25 MCG/INH AEPB Inhale 1 puff into the lungs daily as needed (for SOB).     Historical Provider, MD  glipiZIDE (GLUCOTROL) 5 MG tablet take 1 tablet by mouth once daily Patient not taking: Reported on 11/28/2016 06/28/16   Sela Hua, MD  meclizine (ANTIVERT) 25 MG tablet Take 25 mg by mouth every 6 (six) hours as needed for dizziness. 11/06/16   Historical Provider, MD  nystatin (MYCOSTATIN) powder Apply topically 2 (two) times daily. Apply to affected skin BID Patient taking differently: Apply topically 2 (two) times daily as needed. TO AFFECTED AREAS 10/26/15   [name], MD  Omega-3 Fatty Acids (FISH OIL) 1000 MG CAPS Take 2 capsules (2,000 mg total) by mouth at bedtime. 06/10/16   Sela Hua, MD  oxyCODONE-acetaminophen (PERCOCET) 10-325 MG tablet Take 1 tablet by mouth every 6 (six) hours as needed for pain. 11/28/16   Sela Hua, MD  OXYGEN Inhale 4 L into the lungs continuous. 4l/min     Historical Provider, MD  polyethylene glycol (MIRALAX / GLYCOLAX) packet Take 17 g by mouth daily as needed for mild constipation.     Historical Provider, MD  potassium chloride SA (K-DUR,KLOR-CON) 20 MEQ tablet Take 2 tablets (40 mEq total) by mouth daily. 06/04/16   Sela Hua, MD  pregabalin (LYRICA) 150 MG capsule Take 1 capsule (150 mg total) by mouth 2 (two) times daily. 11/28/16   Sela Hua, MD  Psyllium (EQ DAILY FIBER PO) Take 1 capsule by mouth daily.    Historical  Provider, MD  torsemide (DEMADEX) 20 MG tablet Take 40 mg by mouth in the AM and 20 mg by mouth in the PM. Patient taking differently: Take 40 mg by mouth 2 (two) times daily.  10/21/16   Lovenia Kim, MD  traZODone (DESYREL) 150 MG tablet Take 2 tablets (300 mg total) by mouth at bedtime. 04/02/16   Sela Hua, MD  venlafaxine XR (EFFEXOR-XR) 150 MG 24 hr capsule Take 2 capsules (300 mg total) by mouth daily. 08/07/16   Sela Hua, MD    Family History Family History  Problem Relation  Age of Onset  . Stroke Mother   . Dementia Father   . Prostate cancer Father   . Congestive Heart Failure Brother   . Diabetes Brother   . Hypertension Brother     Social History Social History  Substance Use Topics  . Smoking status: Former Smoker    Packs/day: 1.00    Years: 30.00    Types: Cigarettes    Quit date: 01/05/2009  . Smokeless tobacco: Never Used  . Alcohol use No     Allergies   Aspirin   Review of Systems Review of Systems  ROS 10 Systems reviewed and are negative for acute change except as noted in the HPI.     Physical Exam Updated Vital Signs BP 94/81 (BP Location: Right Wrist)   Pulse 81   Temp 97.4 F (36.3 C) (Oral)   Resp 22   Ht 5\' 2"  (1.575 m)   Wt (!) 375 lb (170.1 kg)   SpO2 91%   BMI 68.59 kg/m   Physical Exam  Constitutional: She is oriented to person, place, and time. She appears well-developed.  HENT:  Head: Normocephalic and atraumatic.  Eyes: EOM are normal.  Neck: Normal range of motion. Neck supple.  Cardiovascular: Normal rate.   Pulmonary/Chest: Effort normal.  Abdominal: Bowel sounds are normal. She exhibits distension. There is tenderness.  Neurological: She is alert and oriented to person, place, and time.  Skin: Skin is warm and dry. Rash noted.  Nursing note and vitals reviewed.    ED Treatments / Results  Labs (all labs ordered are listed, but only abnormal results are displayed) Labs Reviewed  CBC WITH  DIFFERENTIAL/PLATELET - Abnormal; Notable for the following:       Result Value   RBC 3.22 (*)    Hemoglobin 9.1 (*)    HCT 32.2 (*)    MCHC 28.3 (*)    RDW 17.4 (*)    Platelets 148 (*)    All other components within normal limits  I-STAT CHEM 8, ED - Abnormal; Notable for the following:    Chloride 89 (*)    Creatinine, Ser 1.10 (*)    Glucose, Bld 115 (*)    Hemoglobin 9.9 (*)    HCT 29.0 (*)    All other components within normal limits  COMPREHENSIVE METABOLIC PANEL  LIPASE, BLOOD  I-STAT BETA HCG BLOOD, ED ([facility], [facility], [facility] ONLY)    EKG  EKG Interpretation None       Radiology No results found.  Procedures Procedures (including critical care time)  Medications Ordered in ED Medications - No data to display   Initial Impression / Assessment and Plan / ED Course  I have reviewed the triage vital signs and the nursing notes.  Pertinent labs & imaging results that were available during my care of the patient were reviewed by me and considered in my medical decision making (see chart for details).  Clinical Course     Pt comes in with abd pain.  Exam limited due to body habitus - but it appears that the pain is due to  Abdominal wall abscess vs. Cellulitis. CT scan ordered. Pt othwewiise appears to be at baseline.  Notes show that SW has been trying to get pt help, and have been unable to reach her, so SW has been consulted while pt is here.  Final Clinical Impressions(s) / ED Diagnoses   Final diagnoses:  Generalized abdominal pain  Constipation, unspecified constipation type    New Prescriptions  New Prescriptions   No medications on file     Varney Biles, MD 12/16/16 [phone]

## 2016-12-14 NOTE — ED Triage Notes (Signed)
Patient arrived to ED via [facility]. EMS reports: Patient from home. Lives with brother. Patient c/o RLQ pain which increases with movement x 3 weeks, and constipation x 1 week. Denies nausea, vomiting, diarrhea. Abdomen tender. Patient guarding.  Patient ambulatory for short distance at home to stretcher.  Patient on home oxygen (2-4 LPM via nasal cannula). Mouth breather. Pulse ox 78-100% on oxygen - drops when mouth breathing.  VSS. BP 121/62, Pulse 76.  Patient defecated en route to ED.

## 2016-12-16 ENCOUNTER — Encounter (HOSPITAL_COMMUNITY): Payer: Self-pay

## 2016-12-16 ENCOUNTER — Emergency Department (HOSPITAL_COMMUNITY)
Admission: EM | Admit: 2016-12-16 | Discharge: 2016-12-17 | Disposition: A | Discharge status: Home / Self Care | Payer: Medicare Other | Attending: Emergency Medicine | Admitting: Emergency Medicine

## 2016-12-16 DIAGNOSIS — Z7984 Long term (current) use of oral hypoglycemic drugs: Secondary | ICD-10-CM | POA: Diagnosis not present

## 2016-12-16 DIAGNOSIS — I5032 Chronic diastolic (congestive) heart failure: Secondary | ICD-10-CM | POA: Insufficient documentation

## 2016-12-16 DIAGNOSIS — R600 Localized edema: Secondary | ICD-10-CM | POA: Diagnosis not present

## 2016-12-16 DIAGNOSIS — L853 Xerosis cutis: Secondary | ICD-10-CM | POA: Diagnosis not present

## 2016-12-16 DIAGNOSIS — Z87891 Personal history of nicotine dependence: Secondary | ICD-10-CM | POA: Diagnosis not present

## 2016-12-16 DIAGNOSIS — I110 Hypertensive heart disease with heart failure: Secondary | ICD-10-CM | POA: Insufficient documentation

## 2016-12-16 DIAGNOSIS — J449 Chronic obstructive pulmonary disease, unspecified: Secondary | ICD-10-CM | POA: Insufficient documentation

## 2016-12-16 DIAGNOSIS — M7989 Other specified soft tissue disorders: Secondary | ICD-10-CM | POA: Diagnosis present

## 2016-12-16 DIAGNOSIS — E1140 Type 2 diabetes mellitus with diabetic neuropathy, unspecified: Secondary | ICD-10-CM | POA: Insufficient documentation

## 2016-12-16 DIAGNOSIS — I872 Venous insufficiency (chronic) (peripheral): Secondary | ICD-10-CM | POA: Diagnosis not present

## 2016-12-16 DIAGNOSIS — R030 Elevated blood-pressure reading, without diagnosis of hypertension: Secondary | ICD-10-CM | POA: Diagnosis not present

## 2016-12-16 DIAGNOSIS — M79604 Pain in right leg: Secondary | ICD-10-CM | POA: Diagnosis not present

## 2016-12-16 LAB — CBG MONITORING, ED: GLUCOSE-CAPILLARY: 115 mg/dL — AB (ref 65–99)

## 2016-12-16 MED ORDER — EUCERIN EX LOTN: TOPICAL_LOTION | CUTANEOUS | 0 refills | Status: DC | PRN

## 2016-12-16 NOTE — ED Triage Notes (Signed)
Pt is brought in by EMS from home, pt lives with her brother in whom is her PCG. Pt is c/o rt leg swelling. Per EMS pt takes Lasix at home for CHF and d/t patient CG currently in the hospital at this time pt did not take her medicine, as she has difficulty ambulating independently and did not want to take medication as it would cause urinary frequency.

## 2016-12-16 NOTE — ED Notes (Signed)
Pt has  Redness and discoloration noted to bilateral LE  With 3+ pitting edema. Pt also is c/o pain to bilateral knees 10/10. Raised bump and blisters are noted on both legs bilaterally. Pt reports that she has not taken her lasix for about 3 days.

## 2016-12-16 NOTE — ED Provider Notes (Signed)
[facility] DEPT Provider Note   CSN: [id number] Arrival date & time: 12/16/16  2156     History   Chief Complaint Chief Complaint  Patient presents with  . Leg Swelling    HPI Kathleen Roy is a 56 y.o. female.  The history is provided by the patient.  Rash   This is a chronic problem. The current episode started more than 1 week ago. The problem has been gradually worsening. Associated with: bilateral venous stasis. There has been no fever. The rash is present on the right lower leg and right buttock. The pain is mild. The pain has been constant since onset. Associated symptoms include weeping. She has tried nothing for the symptoms. Risk factors: stasis, habitus.    Past Medical History:  Diagnosis Date  . Arthritis    "I feel like it's everywhere" (11/08/2014)  . Cellulitis and abscess of lower extremity 08/16/2016   right leg  . Chronic lower back pain   . COPD (chronic obstructive pulmonary disease) ([place])   . Depression   . Diabetes mellitus without complication ([place])    type 2   . Fibromyalgia   . Headache    "at least weekly; it's usually when I first wake up" (11/08/2014)  . Hyperlipidemia   . Hypertension   . HYPERTENSION, BENIGN SYSTEMIC 02/12/2007  . Memory disturbance   . Migraines 1970's - <2000   "they just went away"  . Neuropathy ([place])   . Normal echocardiogram 05/30/05   suboptimal study  . Obesity, morbid (more than 100 lbs over ideal weight or BMI > 40) (HCC)    obese since childhood  . Osteoarthritis   . Peripheral edema   . Pressure ulcer of foot    left  . Sepsis ([place])   . Shortness of breath dyspnea   . Sleep apnea    "recently dx'd; haven't got my equipment yet" (11/08/2014)    Patient Active Problem List   Diagnosis Date Noted  . Cellulitis 11/26/2016  . Cough   . Insomnia   . Palliative care by specialist   . Advance care planning   . Chronic pain of both knees   . Acute respiratory failure with hypoxia and hypercarbia ([place])  10/15/2016  . General weakness   . Hypoxia 10/14/2016  . Bacteremia   . Cellulitis of right lower extremity 06/04/2016  . Type 2 diabetes mellitus ([place]) 02/14/2016  . Pressure ulcer 01/16/2016  . Acute on chronic respiratory failure with hypercapnia ([place])   . Chronic diastolic congestive heart failure ([place])   . Essential hypertension   . CAP (community acquired pneumonia) 01/15/2016  . Bilateral knee pain 03/22/2015  . OSA (obstructive sleep apnea)   . Obesity hypoventilation syndrome ([place])   . Chronic respiratory failure with hypoxia ([place])   . Assistance needed for grooming 11/08/2014  . Fatigue 11/03/2014  . Memory loss 03/18/2014  . Tremor 03/18/2014  . Vulvovaginal candidiasis 11/25/2013  . Restrictive airway disease 08/26/2013  . Hypokalemia 08/19/2013  . Health care maintenance 06/17/2013  . Venous stasis dermatitis of both lower extremities 12/02/2012  . COPD (chronic obstructive pulmonary disease) ([place]) 10/15/2011  . Peripheral neuropathy ([place]) 10/15/2011  . KNEE PAIN, BILATERAL 01/22/2011  . BACK PAIN, LUMBAR, CHRONIC 01/22/2011  . MIGRAINE HEADACHE 10/25/2010  . HYPERLIPIDEMIA 04/07/2009  . EDEMA, CHRONIC 06/27/2008  . Morbid obesity ([place]) 11/16/2007  . DEPRESSION, MAJOR, RECURRENT 02/12/2007  . PANIC ATTACKS 02/12/2007  . HYPERTENSION, BENIGN SYSTEMIC 02/12/2007    Past Surgical  History:  Procedure Laterality Date  . ABDOMINAL HERNIA REPAIR  2002  . APPENDECTOMY  1995  . CHOLECYSTECTOMY  1995  . TOTAL ABDOMINAL HYSTERECTOMY  1994    OB History    Gravida Para Term Preterm AB Living   0 0 0 0 0 0   SAB TAB Ectopic Multiple Live Births   0 0 0 0         Home Medications    Prior to Admission medications   Medication Sig Start Date End Date Taking? Authorizing Provider  albuterol (PROVENTIL HFA;VENTOLIN HFA) 108 (90 Base) MCG/ACT inhaler Inhale 2 puffs into the lungs every 4 (four) hours as needed for wheezing or shortness of breath. 10/17/16  Yes  Lovenia Kim, MD  ARIPiprazole (ABILIFY) 2 MG tablet take 1 tablet by mouth every other day Patient taking differently: Take 2 mg by mouth every other day 03/08/16  Yes Sela Hua, MD  Ascorbic Acid (VITAMIN C) 1000 MG tablet Take 3,000 mg by mouth at bedtime.    Yes Historical Provider, MD  atorvastatin (LIPITOR) 10 MG tablet Take 1 tablet (10 mg total) by mouth at bedtime. 11/28/16  Yes Sela Hua, MD  cephALEXin (KEFLEX) 500 MG capsule Take 1 capsule (500 mg total) by mouth 4 (four) times daily. 12/14/16  Yes Lajean Saver, MD  cetirizine (ZYRTEC) 10 MG tablet Take 10 mg by mouth daily.   Yes Historical Provider, MD  cyclobenzaprine (FLEXERIL) 10 MG tablet Take 1 tablet (10 mg total) by mouth 3 (three) times daily as needed for muscle spasms. 06/17/16  Yes Sela Hua, MD  fluticasone furoate-vilanterol (BREO ELLIPTA) 100-25 MCG/INH AEPB Inhale 1 puff into the lungs daily as needed (for SOB).    Yes Historical Provider, MD  meclizine (ANTIVERT) 25 MG tablet Take 25 mg by mouth every 6 (six) hours as needed for dizziness. 11/06/16  Yes Historical Provider, MD  nystatin (MYCOSTATIN) powder Apply topically 2 (two) times daily. Apply to affected skin BID Patient taking differently: Apply topically 2 (two) times daily as needed. TO AFFECTED AREAS 10/26/15  Yes [name], MD  Omega-3 Fatty Acids (FISH OIL) 1000 MG CAPS Take 2 capsules (2,000 mg total) by mouth at bedtime. 06/10/16  Yes Sela Hua, MD  oxyCODONE-acetaminophen (PERCOCET) 10-325 MG tablet Take 1 tablet by mouth every 6 (six) hours as needed for pain. 11/28/16  Yes Sela Hua, MD  OXYGEN Inhale 4 L into the lungs continuous. 4l/min    Yes Historical Provider, MD  polyethylene glycol (MIRALAX / GLYCOLAX) packet Take 17 g by mouth daily as needed for mild constipation.    Yes Historical Provider, MD  potassium chloride SA (K-DUR,KLOR-CON) 20 MEQ tablet Take 2 tablets (40 mEq total) by mouth daily. 06/04/16  Yes Sela Hua, MD  pregabalin (LYRICA) 150 MG capsule Take 1 capsule (150 mg total) by mouth 2 (two) times daily. 11/28/16  Yes Sela Hua, MD  Psyllium [facility] DAILY FIBER PO) Take 1 capsule by mouth daily.   Yes Historical Provider, MD  torsemide (DEMADEX) 20 MG tablet Take 40 mg by mouth in the AM and 20 mg by mouth in the PM. Patient taking differently: Take 40 mg by mouth 2 (two) times daily.  10/21/16  Yes Lovenia Kim, MD  traZODone (DESYREL) 150 MG tablet Take 2 tablets (300 mg total) by mouth at bedtime. 04/02/16  Yes Sela Hua, MD  venlafaxine XR (EFFEXOR-XR) 150 MG 24 hr capsule Take  2 capsules (300 mg total) by mouth daily. 08/07/16  Yes Sela Hua, MD  glipiZIDE (GLUCOTROL) 5 MG tablet take 1 tablet by mouth once daily Patient not taking: Reported on 11/28/2016 06/28/16   Sela Hua, MD    Family History Family History  Problem Relation Age of Onset  . Stroke Mother   . Dementia Father   . Prostate cancer Father   . Congestive Heart Failure Brother   . Diabetes Brother   . Hypertension Brother     Social History Social History  Substance Use Topics  . Smoking status: Former Smoker    Packs/day: 1.00    Years: 30.00    Types: Cigarettes    Quit date: 01/05/2009  . Smokeless tobacco: Never Used  . Alcohol use No     Allergies   Aspirin   Review of Systems Review of Systems  Skin: Positive for rash.  All other systems reviewed and are negative.    Physical Exam Updated Vital Signs BP 133/79 (BP Location: Right Arm)   Pulse 90   Resp 18   Wt (!) 375 lb (170.1 kg)   SpO2 100%   BMI 68.59 kg/m   Physical Exam  Constitutional: She is oriented to person, place, and time. She appears well-developed and well-nourished. No distress.  HENT:  Head: Normocephalic.  Nose: Nose normal.  Eyes: Conjunctivae are normal.  Neck: Neck supple. No tracheal deviation present.  Cardiovascular: Normal rate and regular rhythm.   Pulmonary/Chest: Effort normal. No  respiratory distress.  Abdominal: Soft. She exhibits no distension.  Musculoskeletal:  Bilateral bitting LE edema with weeping of upper right shin.  Neurological: She is alert and oriented to person, place, and time.  Skin: Skin is warm and dry.  Area of dry skin below buttock on right without eruption  Psychiatric: She has a normal mood and affect.     ED Treatments / Results  Labs (all labs ordered are listed, but only abnormal results are displayed) Labs Reviewed  CBG MONITORING, ED - Abnormal; Notable for the following:       Result Value   Glucose-Capillary 115 (*)    All other components within normal limits    EKG  EKG Interpretation None       Radiology No results found.  Procedures Procedures (including critical care time)  Medications Ordered in ED Medications - No data to display   Initial Impression / Assessment and Plan / ED Course  I have reviewed the triage vital signs and the nursing notes.  Pertinent labs & imaging results that were available during my care of the patient were reviewed by me and considered in my medical decision making (see chart for details).  Clinical Course     56 y.o. female presents with chronic leg swelling and now weeping from right leg venous stasis. She als complains of an area on her upper right posterior leg which is bothering her that appears to be dry skin. No evidence of skin infection currently. Her main caregiver is hospitalized. She likely needs unna boots or other pressure dressings to help with her symptoms. Referral for home health wound care and aides was offered. Topical emollient offered for dry skin on back of leg. Plan to follow up with PCP as needed and return precautions discussed for worsening or new concerning symptoms.   Final Clinical Impressions(s) / ED Diagnoses   Final diagnoses:  Bilateral lower extremity edema  Venous stasis dermatitis of right lower extremity  Dry skin dermatitis    New  Prescriptions New Prescriptions   EMOLLIENT (EUCERIN) LOTION    Apply topically as needed for dry skin. To back of leg     Leo Grosser, MD 12/17/16 [phone]

## 2016-12-16 NOTE — ED Notes (Signed)
Bed: WA06 Expected date:  Expected time:  Means of arrival:  Comments: 56 yo F  Right lower leg edema

## 2016-12-17 ENCOUNTER — Other Ambulatory Visit: Payer: Self-pay | Admitting: *Deleted

## 2016-12-17 ENCOUNTER — Encounter: Payer: Self-pay | Admitting: *Deleted

## 2016-12-17 DIAGNOSIS — R531 Weakness: Secondary | ICD-10-CM | POA: Diagnosis not present

## 2016-12-17 DIAGNOSIS — R609 Edema, unspecified: Secondary | ICD-10-CM | POA: Diagnosis not present

## 2016-12-17 NOTE — Patient Outreach (Signed)
[facility]) Care Management  12/17/2016  Kathleen Roy Sep 11, 1961 [id number]   Voice message received from member stating that her brother was admitted to the hospital for rectal bleeding (newly diagnosed with cancer, receiving chemotherapy as well as radiation).  She report that she was seen in the ED twice over the weekend in attempt to be admitted as she has no one at the home to care for her.    Voice message also received from [place], nurse with [facility], stating that he too had received a call from the member, upset that she was not admitted and requesting assistance as she is home alone, unable to care for herself.  This care manager spoke with [name] on Friday, 12/29, notified that personal care services for the member would start this week.  At that time, he requested some personal care items for the member, [name], J. Saporito, notified of request.  Upon receiving these voice messages, call placed to Clinical Assistant Director, [name], to collaborate on plan of care.  Mrs. Pulliam approves assistance to provide personal care items.  She also advises this care manager to contact [facility] and member to inquire about start of services, and to discuss severity of condition and living arrangements with member.  Will also contact assigned [name], Ms. Saporito, to collaborate plan of care.  Call placed to [facility], he reports that he spoke to the member regarding her concerns.  He state that the original plan was to start services tomorrow, but they are attempting to have a tech begin services today.  He is updated on all recommendations/suggestions that has been presented to member regarding placement and her refusal to follow them.  He advises that the program with [facility] is only short term and the member will need a long term plan.  Able to initiate 3 way call with member and Ms. Saporito (see [name] note).  In addition to note per [name], member is also  reluctant to consider placement due to her cat, stating "I would just die if I can't be with her."  She is advised that it would be really difficult for her to care for her cat since she is unable to care for herself, and not knowing what her brother's condition will be upon discharge.  She state she will consider suggestions.  Notified by April with [facility] that tech will be at the home around 245 to provide services.  This care manager will meet tech at the home to provide supplies provided by [facility] and to perform assessment.  [name], [place], MSN [place] [phone]

## 2016-12-17 NOTE — Patient Outreach (Signed)
[facility]) Care Management  12/17/2016  Kathleen Roy Mar 29, 1961 [id number]   CSW was finally able to make initial contact with patient today, after patient's RNCM with [facility], [name] was able to reach patient by phone and initiate a 3-way call. CSW had received a voicemail message from patient at 5:47am this morning, indicating that she had been to [facility] Emergency Department twice over the weekend, desperately trying to get admitted, without success.  Patient went on to say that her brother, Ameri Garciaramirez, for whom patient currently resides, also patient's primary caregiver, was admitted to [facility] on Sunday, December 31st.  Mr. Wigley is currently undergoing aggressive treatment for Colorectal Cancer, needing to be admitted for rectal bleeding.  Mr. Roytman plan of care has yet to be determined, nor has Mr. Amaya been given a tentative discharge date. CSW obtained two HIPAA compliant identifiers from patient, which included patient's name and date of birth.  CSW introduced self, explained role and types of services provided through [employer].  CSW explained the reason for the call, indicating that CSW, along with Mrs. Orene Desanctis, are extremely concerned about patient's health and well-being in the home, as patient admits to being completely dependent on her brother for all activities of daily living.  Patient admitted to [place] and Mrs. Orene Desanctis that she has not eaten in the last 24 hours, as she is unable to ambulate and/or prepare meals for herself.  Patient reported that her friend was able to bring her fast food from [employer] mid-day yesterday, but that she has not eaten anything since. Patient has been denied home health services through several different agencies, as all have indicated that patient is currently not living in a safe home environment and that she requires 24 hour care and supervision, which  she would only be able to receive in a long-term care facility, such as an assisted living setting.  Mrs. Orene Desanctis was able to arrange home care services for patient through [place] with [facility], at the approval of Royann Shivers, Director of [place] [facility], and at the expense of [employer].  Home care services will begin at 2:45pm today through a home health aide that will be providing the following services:  bathing, dressing, meal preparation, feeding, monitoring of medication administration and intake, changing of pads, light housekeeping, etc.  [facility] is requesting that [facility] [place] [facility] assist patient with obtaining the following home care supplies:  No rinse shampoo, gentle cleaner/bath soap, deodorant, Chux pads and any other personal care items that the home health aide recommends.  Mrs. Orene Desanctis was able to converse with Bary Castilla, Surveyor, quantity with [employer], who has approved patient for financial assistance.  These supplies will be delivered directly to patient's home, as soon as the home health aide is available to receive them.  Patient indicated that she already has all necessary durable medical equipment in the home. CSW and Mrs. Orene Desanctis had a very serious conversation with patient about her current living arrangements and how she will need to begin considering long-term care placement arrangement, outside the home.  Very gently, delicately and empathically, CSW spoke with patient at length about Mr. Hoefs health and current medical conditions, and how he really needs to be able to focus on treatment and recovery, without having to provide caregiver services to patient.  CSW explained to  patient that home care services, through [facility], is only a quick fix and basically put in place long enough (maximum of 6 weeks) for CSW to work with patient on  finding more permanent, safe and secure housing arrangements.  CSW spoke with patient about long-term care in an assisted living facility, but patient was very reluctant to give up her Social Security Disability check each month.  Further, patient did not want to terminate financial assistance that she is receiving through [employer] with the [employer].  CSW spoke with patient about the possibility of placing her and Mr. Jenness in the same facility, if Mr. Fiegl would be agreeable, that way they could continue to live together and care for one another.  Patient admitted that she would need some time to think about everything and weigh her options.  CSW provided counseling and supportive services, as CSW verbalized understanding of patient not wanting to give up her autonomy and sense of freedom.  CSW inquired about patient's method for obtaining meals while Mr. Demario continues to be hospitalized.  Patient admitted that she will call upon her friend to bring her food.  Mobile Me[employer] through [employer] [facility] is not an option for patient at this time, as patient does not Production assistant, radio.  Not to mention, [employer] is currently on a 2-year waiting list.  Patient admitted to using Mr. Wineberg cell phone today because her cell phone is dead and she is unable to reach the phone or the phone charger.  CSW inquired as to what patient plans to do when Mr. Pross phone dies, as patient will have no form of communication.  Patient indicated that she had not thought that far in advance.  CSW encouraged patient to have her friend place the phone and phone charger within patient's reach, when she comes to deliver patient's food, so that patient will have access to phone services, in the event of an emergency.  Patient voiced understanding and was agreeable to this plan.  CSW agreed to contact patient periodically throughout the day, just to ensure that patient is okay and to inquire about needs.   Patient was also encouraged to contact CSW directly if she needs emotional support or wishes to further discuss long-term care arrangements. [name], [name], [name], [name]  Licensed Education officer, [facility]  Mailing [place] [address], [place], [place] [address] Physical Address-[address], [place], [place] [address] Toll Free Main # [phone] Fax # [phone] Cell # [phone]  Fax # [phone]  [name]

## 2016-12-17 NOTE — Patient Outreach (Signed)
[facility]) Care Management   12/17/2016  [name] 1961/03/03 [id number]  Kathleen Roy is an 56 y.o. female  Subjective:   Member complains of pain, generalized.  Reported taking medications as prescribed, including PRN pain medications.  She state that she has had trouble walking for a while, again admits that she is unable to care for herself.  Objective:   Review of Systems  HENT: Negative.   Eyes: Negative.   Respiratory: Positive for shortness of breath.   Cardiovascular: Positive for leg swelling.  Gastrointestinal: Negative.   Genitourinary: Negative.   Skin:       Recurrent cellulitis on bilateral lower extremities  Neurological: Positive for weakness.  Psychiatric/Behavioral: Negative.     Physical Exam  Constitutional: She is oriented to person, place, and time. She appears well-developed and well-nourished.  Neck: Normal range of motion.  Cardiovascular: Normal rate and regular rhythm.   Respiratory: Effort normal and breath sounds normal.  GI: Soft. Bowel sounds are normal.  Musculoskeletal: Normal range of motion.  Neurological: She is alert and oriented to person, place, and time.  Skin: Skin is dry. There is erythema.   BP 136/84   Pulse 60   Resp 20   Ht 1.575 m (5\' 2" )   Wt (!) 375 lb (170.1 kg)   BMI 68.59 kg/m   Encounter Medications:   Outpatient Encounter Prescriptions as of 12/17/2016  Medication Sig  . albuterol (PROVENTIL HFA;VENTOLIN HFA) 108 (90 Base) MCG/ACT inhaler Inhale 2 puffs into the lungs every 4 (four) hours as needed for wheezing or shortness of breath.  . Ascorbic Acid (VITAMIN C) 1000 MG tablet Take 3,000 mg by mouth at bedtime.   Marland Kitchen atorvastatin (LIPITOR) 10 MG tablet Take 1 tablet (10 mg total) by mouth at bedtime.  . cephALEXin (KEFLEX) 500 MG capsule Take 1 capsule (500 mg total) by mouth 4 (four) times daily.  . cetirizine (ZYRTEC) 10 MG tablet Take 10 mg by mouth daily.  . fluticasone furoate-vilanterol  (BREO ELLIPTA) 100-25 MCG/INH AEPB Inhale 1 puff into the lungs daily as needed (for SOB).   Marland Kitchen glipiZIDE (GLUCOTROL) 5 MG tablet take 1 tablet by mouth once daily  . meclizine (ANTIVERT) 25 MG tablet Take 25 mg by mouth every 6 (six) hours as needed for dizziness.  . nystatin (MYCOSTATIN) powder Apply topically 2 (two) times daily. Apply to affected skin BID (Patient taking differently: Apply topically 2 (two) times daily as needed. TO AFFECTED AREAS)  . Omega-3 Fatty Acids (FISH OIL) 1000 MG CAPS Take 2 capsules (2,000 mg total) by mouth at bedtime.  Marland Kitchen oxyCODONE-acetaminophen (PERCOCET) 10-325 MG tablet Take 1 tablet by mouth every 6 (six) hours as needed for pain.  . OXYGEN Inhale 4 L into the lungs continuous. 4l/min   . polyethylene glycol (MIRALAX / GLYCOLAX) packet Take 17 g by mouth daily as needed for mild constipation.   . potassium chloride SA (K-DUR,KLOR-CON) 20 MEQ tablet Take 2 tablets (40 mEq total) by mouth daily.  . pregabalin (LYRICA) 150 MG capsule Take 1 capsule (150 mg total) by mouth 2 (two) times daily.  . Psyllium (EQ DAILY FIBER PO) Take 1 capsule by mouth daily.  Marland Kitchen torsemide (DEMADEX) 20 MG tablet Take 40 mg by mouth in the AM and 20 mg by mouth in the PM. (Patient taking differently: Take 40 mg by mouth 2 (two) times daily. )  . traZODone (DESYREL) 150 MG tablet Take 2 tablets (300 mg total) by  mouth at bedtime.  Marland Kitchen venlafaxine XR (EFFEXOR-XR) 150 MG 24 hr capsule Take 2 capsules (300 mg total) by mouth daily.  . ARIPiprazole (ABILIFY) 2 MG tablet take 1 tablet by mouth every other day (Patient not taking: Reported on 12/17/2016)  . cyclobenzaprine (FLEXERIL) 10 MG tablet Take 1 tablet (10 mg total) by mouth 3 (three) times daily as needed for muscle spasms. (Patient not taking: Reported on 12/17/2016)  . Emollient (EUCERIN) lotion Apply topically as needed for dry skin. To back of leg (Patient not taking: Reported on 12/17/2016)   No facility-administered encounter medications  on file as of 12/17/2016.     Functional Status:   In your present state of health, do you have any difficulty performing the following activities: 12/17/2016 11/27/2016  Hearing? N N  Vision? N N  Difficulty concentrating or making decisions? Y N  Walking or climbing stairs? Y Y  Dressing or bathing? Y Y  Doing errands, shopping? Tempie Donning  Preparing Food and eating ? Y -  Using the Toilet? Y -  In the past six months, have you accidently leaked urine? Y -  Do you have problems with loss of bowel control? Y -  Managing your Medications? Y -  Managing your Finances? Y -  Housekeeping or managing your Housekeeping? Y -  Some recent data might be hidden    Fall/Depression Screening:    PHQ 2/9 Scores 12/17/2016 02/14/2016 04/25/2015 03/21/2015 01/20/2015 11/25/2014 11/08/2014  PHQ - 2 Score 2 1 3 6  0 0 2  PHQ- 9 Score 8 - 14 18 - - -    Assessment:    Arrived at [employer] home, nurse tech from [employer] already present and actively bathing member.  Member has been in recliner in the living room since arriving back home from hospital.  She needs  assistance even to just go from sitting to standing position, max assistance needed for bathing.  Once up, it is very difficult for her to walk the very short distance from the kitchen to the living room, complaining of pain and feeling as if her legs will give out.  She state she has had drinks (soda), phone, bedside commode, walker, and medications within her reach (she has a Secondary school teacher).  She has not had anything to eat since yesterday when a friend brought her [employer]'s.  She denies having anyone else to help, states that she did call the only 2 people that she could today without success.  Member was provided with microwave dinner after bath was complete.  She was advised of appropriate low sodium/diabetic diet, but report that all she eat are microwave dinners.  Even so, she is unable to stand and get to kitchen to place food in microwave independently.     After member's bath, she questions about her right leg weeping (clear fluid).  She state that this was one of the reasons she went to the hospital on Monday in attempt to be admitted.  Call placed to primary MD office in attempt to obtain order for dressings, office closed due to holiday.  Leg wrapped with dry kerlix to catch drainage over the next several hours until contact made with MD.  Member is made aware again that [facility] is short term and will only be there 2 hrs/day, starting off 7 days/week, decreasing the number of days every week.  Discussed with member her plan during the times that no one is with her, especially while brother is in hospital, and  her long term plan.  She is unable to state a personal plan.  She state that she will have all necessities placed within her reach again and wait until someone is able to come back to the home.    Discussed with member the severity of her brother's illness and the fact that he will not be able to care for her upon his discharge back home.  Again discussed having member placed in facility that will be able to provide the level of care needed.  She still does not agree, stating that she has worked hard for what she has, including her cat, and does not want to lose any of it.  She is advised of the severity of her condition and the risks/complications of her not having the care needed, including death.  She is still reluctant to agree to placement, would prefer to speak with her brother.  She is advised that his care manager received referral for [facility] involvement with brother and this care manager will contact him as well to develop plan of care once he is discharged.     She denies any questions at this time, only request for this care manager and nurse tech to have bedside commode, medications, drink, and walker placed around her, and to have her cat placed in a position to be able to get to her food/water.    Plan:   Will follow up with  primary MD tomorrow. Will follow up with member within the next 2 days.  [name], [place], MSN [place] [phone]

## 2016-12-17 NOTE — ED Notes (Signed)
[facility]  returned to pick up patient

## 2016-12-17 NOTE — ED Notes (Signed)
[facility] called  

## 2016-12-17 NOTE — ED Notes (Signed)
[facility] arrived and stated they would return

## 2016-12-18 ENCOUNTER — Other Ambulatory Visit: Payer: Self-pay | Admitting: *Deleted

## 2016-12-18 ENCOUNTER — Telehealth: Payer: Self-pay | Admitting: Internal Medicine

## 2016-12-18 ENCOUNTER — Other Ambulatory Visit: Payer: Self-pay | Admitting: Internal Medicine

## 2016-12-18 MED ORDER — CYCLOBENZAPRINE HCL 10 MG PO TABS
10.0000 mg | ORAL_TABLET | Freq: Three times a day (TID) | ORAL | 2 refills | Status: DC | PRN
Start: 2016-12-18 — End: 2017-11-10

## 2016-12-18 MED ORDER — ARIPIPRAZOLE 2 MG PO TABS: 2.0000 mg | ORAL_TABLET | ORAL | 0 refills | Status: AC

## 2016-12-18 NOTE — Telephone Encounter (Signed)
Will forward to MD to advise. [name],CMA  

## 2016-12-18 NOTE — Telephone Encounter (Signed)
Pt called in this morning wanting to know if she could get orders to wrap her leg since pt is unable to do so. Pt is calling back and would like to know when/if this could happen. Or what would Dr. Brett Albino like her to do. Previous noted was listed in today's refill note. ep

## 2016-12-18 NOTE — Telephone Encounter (Signed)
In home health care started yesterday and 2 Rx's "walked away", Abilify and Flexeril. Pt would like a 90 day supply of Flexeril sent to [facility]. Pt wanted to let Dr. Brett Albino know she went to [facility] over the holiday weekend for leg weeping, wanted to be admitted, but was sent home. Then went to [facility] same reason and was still sent home. Pt was put on Keflex. Pt would also like orders to have someone come in and wrap her leg since she is unable to do so. ep

## 2016-12-18 NOTE — Patient Outreach (Signed)
[facility]) Care Management  12/18/2016  Kathleen Roy [birth year] [id number]   Voice message received from member regarding kerlix that was placed on leg yesterday after bath.  She state that it is soiled and the aide from [facility] is unable to remove it per protocol.  Call placed to member, she state that the aide is no longer in the home.  She state that she has called her primary MD office to receive further instructions regarding her leg weeping but has not received a call back yet.  She is advised to go ahead and remove the kerlix.  She is unable to place an underpad under her leg to catch the drainage as no one is in the home with her and the supplies are in the bedroom.  She is advised again of the level of care that is needed to care for her, and encouraged to consider placement again.    Call placed to member's primary MD in attempt to obtain further instructions for legs, unsuccessful due to long hold times (was on hold >10 minutes).  Call then placed to Clinical Assistant Director, [name], to provide update on care.  She is made aware that this care manager has received referral to initiate [facility] services (including [name] involvement for community resources) for member's brother.  Will reach out to brother to follow up upon discharge.   Will follow up with member once contact made with MD.  [name], RN, MSN [place] Manager [phone]

## 2016-12-19 ENCOUNTER — Other Ambulatory Visit: Payer: Self-pay | Admitting: *Deleted

## 2016-12-19 ENCOUNTER — Other Ambulatory Visit: Payer: Self-pay | Admitting: Internal Medicine

## 2016-12-19 DIAGNOSIS — I890 Lymphedema, not elsewhere classified: Secondary | ICD-10-CM

## 2016-12-19 NOTE — Telephone Encounter (Signed)
Pt informed that [facility] has been contacted and someone will be coming by weekly to change her unna boots. Pt voiced understanding.

## 2016-12-19 NOTE — Patient Outreach (Addendum)
[facility]) Care Management  12/19/2016  Kathleen Roy 1961-01-06 [id number]   Received forwarded voice message from [name], [place], from S. O'Conner with [facility].  Mr. [name] expresses concern regarding plan of care for member in light of multiple responses to member's home (35, unaware of timeframe for responses).    Call placed to Mr. O'Conner to follow up on concerns for plan of care.  He state that since he left the voice message he has spoken with member and is aware to assistance being provided by [facility] and other community resources [employer]).  He is aware that [facility] continue to work to provide a long term plan of care for member.  He report that he is available to provide access to resources in [facility] if needed to help with appropriate plan of care.  He is also made aware that [place] practitioner will begin seeing member in the home next week as well.  He denies any other questions at this time, will follow up as needed.  [name], [place], MSN [place] [phone]

## 2016-12-19 NOTE — Addendum Note (Signed)
Addended byValente David on: 12/19/2016 08:21 PM   Modules accepted: Orders

## 2016-12-19 NOTE — Patient Outreach (Signed)
[facility]) Care Management  12/19/2016  STEFANEE MCKELL 12-Feb-1961 [id number]   Call received from primary MD regarding calls received from member concerning her weeping legs.  Difficulty of managing member's care discussed, MD confirms that she too has had the same issues with plan of care and ensuring that the member has the level of care needed.  MD made aware that [facility] has started working with member, and that [facility] are now active, but only for short term care.    MD made aware that this care manager applied dry Kerlix to right leg on Tuesday, but other orders are needed for leg dressings.  She is advised that member has not been a candidate for home health due to her level of care, and that dressing changes will have to be done by [place].  She is also made aware that [facility] will not be able to support daily dressing changes.  Order received for una boots to both legs, change weekly.  This care manager will keep MD updated on member's status.  Member has appointment scheduled for next week in office.  Call then placed to G. Pulliam, Tax inspector, to provide update on order received.  This care manager advised to contact [facility] Nurse Practitioner, C. Spinks, to initiate orders.  Call placed to Ms. Spinks, update on member's status provided.  She report that she will be able to begin with member next week.  Member called and notified of order for una boots and made aware that C. Spinks, NP will contact her for further information/instructions.  She verbalizes understanding.  Member report that her brother is expected to be discharged today.  Member inquires about role and responsibilities of [facility], stating that she did not have a bath yesterday.  Advised that the aide is typically there to help with ADLs and household needs of the member.  She is also advised that they are only allotted 2 hours/day and they may also have to run errands for the  member, depending on the daily needs.  Member does confirm that aide had to go to pharmacy to obtain refills for member yesterday.  She verbalizes that all of her needs will not be met within that 2 hours time span.  She is also aware that she still need to consider other options for her care.  Will follow up with member within the next week.  [name], [place], MSN [place] [phone]

## 2016-12-19 NOTE — Telephone Encounter (Signed)
Please let Ms. Headen know that I have gotten in touch with [name] with [facility] this morning to discuss our options. I have placed an order for someone to come out to the house and place unna boots. These will need to be changed weekly. Thank you!

## 2016-12-19 NOTE — Progress Notes (Signed)
Received a call from patient stating that her legs are weeping. I discussed this issue over the phone with [name], RN from [facility]. Patient has been denied by multiple different home health services as they feel her care is outside the scope of home health. Ms. Kathleen Roy states that there is no one that can come out to Kathleen Roy's house every day to change Kerlix dressings. I have placed an order for bilateral unna boots to be placed and changed weekly.  Ideally, patient and her brother should be moved to a nursing facility, as it is not a safe home environment for them to live in. Her brother is no longer able to provide adequate care for her, because he is dealing with his own health issues. [facility] has been discussing this option with patient, but she has refused up to this point. We will continue to discuss this plan, as it is likely the safest for her and her brother. For the time being, we will try to treat her chronic medical conditions over the phone.  Hyman Bible, MD

## 2016-12-19 NOTE — Patient Outreach (Signed)
[facility]) Care Management  12/19/2016  Kathleen Roy May 04, 1961 [id number]   CSW was able to make contact with patient today to follow-up regarding home care services through [facility], recently put into place by [name], [facility] with [place] [facility].  CSW also wanted to ensure that patient does not have any emergent needs at present, as patient continues to resides in her home alone while her brother, and primary caregiver, [name] remains hospitalized. Patient admits that she is hopeful that Mr. Panetti will be discharged back home today, as patient reported, "They were able to get him straightened out".  CSW encouraged patient to begin the conversation with Mr. Yao about long-term care placement in an assisted living facility for the two of them.  Patient encouraged CSW to give her one week to continue to think about placement options, as well as initiate this difficult conversation with Mr. Linsenbigler.  CSW voiced understanding and was agreeable to this plan.  CSW also agreed to schedule a home visit with patient and Mr. Leibfried to discuss placement options, if this would help take the burden off of patient.  Patient agreed to keep this in mind.  CSW will contact patient on Thursday, January 11th to follow-up regarding her conversation with Mr. Ruise, as well as to continue to pursue long-term care placement arrangements. [name], [name], [name], [name]  Licensed Education officer, environmental Health System  Mailing [place] [address] [place], [place] [address] Physical Address-[address], [place],  [address] Toll Free Main # [phone] Fax # [phone] Cell # [phone]  Fax # [phone]  [email or web address]

## 2016-12-20 ENCOUNTER — Other Ambulatory Visit: Payer: Self-pay | Admitting: *Deleted

## 2016-12-20 NOTE — Patient Outreach (Signed)
[facility]) Care Management  12/20/2016  TASIANA HASKE [birth year] [id number]   Call placed to member to follow up on contact with C. Spinks, NP.  Member state that she did not speak directly to Ms. Spinks but she did receive a message stating that she will begin services Monday.  Member made aware that per schedule in EPIC, appointment is for 1 pm.  Member questions if Ms. Spinks will be able to come later than 1 pm because she would like to make sure that her brother is there to open the door.   She is advised to contact her directly to discuss time, member confirms that she does have contact information.    Member also advised that she has follow up appointment with primary MD on Thursday next week at 4pm.  She expresses concern regarding transportation and being able to get out of home.  She is advised that this care manager has already discussed transportation issues with Surveyor, quantity, [name], and [facility] is offering transportation.  This care manager will discuss further with Ms. Spinks and Mrs. Pulliam after NP visit on Monday.  Member verbalizes understanding.  This care manager will attempt to attend office visit with member.   She denies any other questions at this time, states she is glad to have her brother back in the home.  She remains aware that she is still in need of an increased level of care, will discuss further with her brother.  Will follow up next week with member.  [name], [place], MSN [place] [phone]

## 2016-12-23 ENCOUNTER — Other Ambulatory Visit: Payer: Self-pay | Admitting: Internal Medicine

## 2016-12-23 ENCOUNTER — Other Ambulatory Visit: Payer: Self-pay | Admitting: *Deleted

## 2016-12-23 NOTE — Telephone Encounter (Signed)
Pt called and would like refill on her Keflex if the doctor wants her to continue with this call this in. jw

## 2016-12-24 ENCOUNTER — Other Ambulatory Visit: Payer: Self-pay | Admitting: *Deleted

## 2016-12-24 ENCOUNTER — Telehealth: Payer: Self-pay | Admitting: Internal Medicine

## 2016-12-24 NOTE — Telephone Encounter (Signed)
That sounds like a great idea! Go ahead and give the verbal orders. Thanks!

## 2016-12-24 NOTE — Telephone Encounter (Signed)
Spoke with pts brother and informed him of plan. Pts brother voiced understanding and they will see [place] on Thursday.

## 2016-12-24 NOTE — Telephone Encounter (Signed)
Kathleen Roy from [facility] is calling to ask for verbal orders for one of the PA's to see the patient at home for her hospital follow up and just office visits. They are having a very difficult time finding transportation for her. She has a lot of issues just getting up from the bed to the chair without having to call EMS. They have been to her home to change her unna boot and so have PA"s that could help. Please call Kathleen Roy at [phone]

## 2016-12-24 NOTE — Telephone Encounter (Signed)
Will forward to MD to advise. [name],CMA  

## 2016-12-24 NOTE — Telephone Encounter (Signed)
Ms. Carrigan has an appointment with me in 2 days. Please let Pt know that I will look at her legs then and decide if we need to continue her antibiotics. It is very difficult for me to make that decision over the phone without seeing her. Thank you!

## 2016-12-24 NOTE — Patient Outreach (Signed)
[facility]) Care Management  12/24/2016  Kathleen Roy [birth year] [id number]   Call received from member regarding transportation for office visit this week.  Made aware that this care manager will follow up with Assistant Director for further instructions on transportation and will follow up with member.  Call then placed to G. Pulliam, Surveyor, quantity, concerning transportation.  She is aware of the issues of getting the member out of the home as member is only able to stand and walk very short distances with walker.  She is unable to get out of the home and down her front steps, also unable to fit in brother's car for transport.  Member is now active with [facility] Nurse Practitioner, C. Spinks, for weekly placement of una boots.  This care advised to contact primary MD with concerns and request permission for NP to make a provider visit to the home in place of the member going to the office.  NP scheduled to visit member next week.  Call then placed to primary MD office, message left with Kennyth Lose requesting permission for NP to make provider visit to the home next week.  Will await call back with order.  Will follow up with member and Ms. Spinks once order received.  [name], [place], MSN [place] [phone]

## 2016-12-24 NOTE — Patient Outreach (Signed)
[facility]) Care Management  12/24/2016  Kathleen Roy [birth year] [id number]   Routine home visit for application of unnaboots.   S: Pt reports her legs are not as bad as they have been before. Pt sits in a recliner during the day but her legs cannot be elevated because the recliner position prohibits full recline with legs fully elevated. She does lie down in a bed at night.  O:  Pt legs are enormous with gross 4+ edema. Serous fluid leaking from lateral and posterior sides of her R foot and lower R leg. Superficial loss of skin in same areas.        L leg is not as worrisome and the R. It also has gross edema, slight errythema to ankle and foot. There are several fluid filled blisters on this leg, no open wounds.  A:  Gross peripheral edema  P:  I had a long discussion about her condition and what needs to be done. I advised her legs will never get better without full elevation of them, being dependent all day long is prohibiting any significant reduction in her edema. Her living room is very small and crowed with an unused large sofa. I asked if she would mind moving this out and storing it or giving it away to [employer]. She agreed to this. I will enlist a couple of my coworkers to assist me in moving the couch out and getting it to [employer]. We can then move her recliner into a position where she can utilize the recliner to it's full potential.  I washed and rinsed her legs and applied unnaboots to both legs. I will be planning on  doing this weekly until further notice.   [name] [facility] [place] [phone]

## 2016-12-25 ENCOUNTER — Other Ambulatory Visit: Payer: Self-pay | Admitting: *Deleted

## 2016-12-25 ENCOUNTER — Other Ambulatory Visit: Payer: Self-pay | Admitting: Internal Medicine

## 2016-12-25 NOTE — Telephone Encounter (Signed)
Verbal orders given. NP is going to pts home next week to change her unna boot and a provider visit will be done as well.

## 2016-12-25 NOTE — Patient Outreach (Signed)
[facility]) Care Management  12/25/2016  Kathleen Roy May 02, 1961 [id number]   Call received from primary MD office, spoke to [facility].  Verbal approval granted for C. Spinks to make provider visit to the member's home next week instead of the member coming into the office tomorrow.  Call also received from member regarding transportation to MD office. She is aware that office visit will be canceled and Ms. Spinks will do hospital follow up provider visit next week in the home.  She verbalizes understanding.  She also state that she was told that the aide from [facility] will not visit tomorrow.  She is reminded that the program is not long term and there may be days that the aide does not visit.  She is advised to contact [facility] directly to discuss their schedule.  She verbalizes understanding.  Member also report that she is concerned about her brother's treatment schedule as she state that he missed his treatment today due to being available to care for her.  She state that it is difficult to get her up at times, which was the case today.  She state that her brother did not want to leave her home alone.  She is very tearful at the thought that she is the cause of her brother.  She is made aware again that her brother's condition may not always allow him to adequately care for her.  She is encouraged again to consider placement where she will have adequate care.  Will discuss again with member and brother during home visit with brother on Friday.  Call placed to Ms. Spinks and G. Pulliam to make them aware that approval was granted for Ms. Spinks to perform hospital follow up next week.  Will transfer to Ms. Spinks at this time, who will be member's assigned [facility] provider.  [name], [place], MSN [place] [phone]

## 2016-12-26 ENCOUNTER — Ambulatory Visit: Payer: Self-pay | Admitting: *Deleted

## 2016-12-26 ENCOUNTER — Other Ambulatory Visit: Payer: Self-pay | Admitting: *Deleted

## 2016-12-26 ENCOUNTER — Inpatient Hospital Stay: Payer: Medicare Other | Admitting: Internal Medicine

## 2016-12-26 ENCOUNTER — Telehealth: Payer: Self-pay | Admitting: Internal Medicine

## 2016-12-26 DIAGNOSIS — J9611 Chronic respiratory failure with hypoxia: Secondary | ICD-10-CM | POA: Diagnosis not present

## 2016-12-26 NOTE — Patient Outreach (Signed)
[facility]) Care Management  12/26/2016  LYNNMARIE RAZZANO 06-Oct-1961 [id number]  CSW was able to meet with patient today in her home to perform the initial home visit.  Patient's brother, Xylie Diner was not available at the time of CSW's visit, so patient instructed CSW to get the house key from underneath the doormat to let myself in.  CSW noted that patient's home is extremely small and very cluttered.  Patient was sitting in her recliner in the living room, watching soap opera's when CSW arrived.  CSW inquired, several times, as to whether or not CSW could get anything for patient while her brother is aware, because CSW is aware that patient is completely bed and/or chair bound.  Patient admits that it is becoming increasingly harder and harder for her brother to try and put her to bed each night, due to the extent of patient's health problems, as well as the fact that patient is morbidly obese. Patient indicated that she and [name] has had multiple conversations lately about long-term care placement for patient in an assisted living facility, and both agree that they wish for patient to remain in the home.  Patient went on to say that [name] has agreed to continue to care for patient, as long as he is physically capable.  CSW and patient spoke about her brother's treatments and the fact that he still has roughly 11 more treatments left.  Patient did not wish to discuss Don's treatment not being effective, just reporting that [name] will no longer be able to take chemotherapy due to the side effects.  CSW offered counseling and supportive services, where appropriate.   Patient was adamant about the fact that placement is not an option for her at this time. Patient appeared to be in good spirits today, talking a lot about her 55 year old cat, for whom she adores.  Patient placed a call to her Primary Care Physician, Dr. Hyman Bible to discuss her medications and potential hazards of taking a narcotic and a  sleeping pill, prior to going to bed.  Patient further reported that [name] has a difficult time waking her in the mornings because she is "so totally out of it".  CSW encouraged patient to discuss her concerns with Dr. Brett Albino, as well as take her medications exactly as prescribed.  Patient denied being able to identify any additional social work needs at present.  However, CSW agreed to follow-up with patient in one week to further assess needs.  Patient voiced understanding and was agreeable to this plan. [name], [name], [name], [name]  Licensed Education officer, environmental Health System  Mailing [place] [address], [place], [place] [address] Physical Address-[address], [place], [place] [address] Toll Free Main # [phone] Fax # [phone] Cell # [phone]  Fax # [phone]  [email or web address]

## 2016-12-26 NOTE — Telephone Encounter (Signed)
Pt would like Dr. Brett Albino to call her. Pt would like to discuss a few things. ep

## 2016-12-26 NOTE — Telephone Encounter (Signed)
Received a call from patient to discuss her medications. She would like to try cutting back on her Trazodone from 2 tablets at night to 1 tablet at night. She is worried that she is too confused in the morning. I let her know that this would be fine to try. She will call me back in 1 week to let me know how it is going.  Hyman Bible, MD

## 2016-12-30 ENCOUNTER — Encounter: Payer: Self-pay | Admitting: *Deleted

## 2016-12-31 ENCOUNTER — Other Ambulatory Visit: Payer: Self-pay | Admitting: *Deleted

## 2017-01-01 NOTE — Patient Outreach (Signed)
[facility]) Care Management  01/01/2017  EDMAE RUNDLETT 01-Apr-1961 [id number]   Home visit for unnaboot change.   Ms. Kerper is doing fair. She tolerated the unnaboots being on without too much discomfort. She said the toughest part was that when she went to bed the sticky outer layer of the bandage (coban) didn't allow her to move her legs around.  I removed the old dressings. The size of her legs is slightly smaller. The open areas on her R leg are nearly closed. The only one that is still open is on her upper, posterior, calf which is very superficial now and just appears to be minimally denuded. The outer RLE is very crusty with chronic skin changes. Some of this crust peeled away with the removal of the bandage to reveal healthier skin below.   I washed and rinsed and dried her legs. I applied a heavy emollient cream and the applied the unnaboots bilaterally and covered them with coban. Pt tolerated procedure fairly. It is painful for her when her heavy legs are lifted to wrap the dressing/coban.  Additional information: My coworkers Freda Jackson and Janace Litten Detinger assisted in moving pt's couch out of the home and into my truck to take to [place]. This was very much appreciated by myself, Ms. Randon and her brother. I provided some basic housekeeping before then moving Ms. Torosyan' s remaining living room furniture to allow her recliner to fully recline. We discovered that her chair will not electronically fully elevate her heavy legs, however, one can lift the leg lift manually and it will stay in place. I advised her brother that he will need to ensure this is done to get the full benefit of having her legs elevated. He stated he understands and will do it.  Ms. Vaughns and I also discussed the plan of care for when her brother goes in for his colon resection. She will not be able to stay alone. She will have to move to a facility. She would like to go to a facility that will allow  cats. She has one cat. The problem with this is that those facilities require the resident to be able to take care of the animal independently and she, unfortunately cannot, she depends on her brother for "Katie's" feeding and litter changing. We discussed this at length which was very distressing to her. In addition, we discussed whether they can continue to rent their small home and return there or if this is just not feasible. I encouraged her to talk with her land lord if he would hold the home open for them, if he would charge rent while they are not there or if they will have to totally move out. This is an additional stress for her to think about.   In my opinion, the only reason she has been able to be in this location is her brother looking after her to the best of his ability which is now coming into question. I feel the future is quite uncertain as to whether he will be able to recuperate fully enough to return himself to independent living and that is of course going to depend on his prognosis after his surgery.  I have offered to adopt the cat, so she will not have to go to a shelter. I can keep the cat long term or if pt is able to care for the animal eventually can return her to Ms. Molchan.  I have tried to impress on  Ms. Asfour the importance of making a plan now so we can move forward in making plans now.  I will see her again next Tuesday. I have invited our [name], [name], who has also already been engaged to come to this visit also.  [name] [facility] [place] [phone]

## 2017-01-02 ENCOUNTER — Other Ambulatory Visit: Payer: Self-pay | Admitting: Internal Medicine

## 2017-01-03 ENCOUNTER — Ambulatory Visit: Payer: Medicare Other | Admitting: *Deleted

## 2017-01-07 ENCOUNTER — Other Ambulatory Visit: Payer: Self-pay | Admitting: *Deleted

## 2017-01-07 ENCOUNTER — Telehealth: Payer: Self-pay | Admitting: Internal Medicine

## 2017-01-07 ENCOUNTER — Ambulatory Visit: Payer: Medicare Other | Admitting: *Deleted

## 2017-01-07 NOTE — Telephone Encounter (Signed)
Pt would like PCP to call her as soon as possible. ep

## 2017-01-07 NOTE — Patient Outreach (Signed)
[facility]) Care Management  01/07/2017  Kathleen Roy Mar 05, 1961 [id number]    CSW was able to meet with patient and patient's brother, Kathleen Roy today to perform the initial home visit with Kathleen Roy.  Also present during the visit was Kathleen Roy, Geriatric Nurse Practitioner with [place] [facility].  CSW than explained the reason for the visit, indicating that Kathleen Roy, [facility] also with [place] [facility], and Kathleen Roy thought that patient and Kathleen Roy would benefit from social work services and resources to assist with long-term care placement arrangements in an assisted living facility.  CSW obtained two HIPAA compliant identifiers from patient, which included patient's name and date of birth. Patient was tearful throughout the entire visit, expressing her reluctance to part with all her personal belongings and independence. Patient also admitted that she did not wish to have to give up her entire Social Security check each month and only receive a minimal allowance.  Patient and Kathleen Roy talked a lot about having worked very hard for their furnished rental house, not wanting to have to give away or get rid of items that they cherish.  CSW spoke with patient and Kathleen Roy at length about being able to take a few items with them to an assisted living facility, encouraging them to go through the house and write down items that they are simply not willing to part with.  CSW and Kathleen Roy talked with patient and Kathleen Roy about assisting them with taking unwanted items to the dumpster, gently used items to [employer] to donate and pricing items they are unable to take with them to sale.  Kathleen Roy agreed to adopt their 42 year old cat, Kathleen Roy, in the event that they are unable to bring Kathleen Roy with them to the assisted living facility. Another task that CSW charged patient and Kathleen Roy with was making a list of their monthly expenses, such as  vehicle maintenance, repairs, gas and insurance, credit cards, etc., as patient and Kathleen Roy fear that they will not have enough money to afford an assisted living facility of their choice.  Patient currently receives Kathleen Roy, but is not an Adult Medicaid recipient.  However, Kathleen Roy receives [place], as well as Adult Medicaid. CSW agreed to assist Kathleen Roy with contacting his Medicaid Case Worker at the [place], Kathleen Roy to switch his Adult Medicaid to [facility].  If necessary, CSW agreed to assist patient with completion of a Long-Term Care Medicaid application, as she currently only receives Fish [employer] Income.  Ideally, patient and Mr. Rosson would like to be placed together in the same assisted living facility, with adjoining rooms, or even a suite, to cut costs.  CSW obtained verbal consent from patient and Mr. Maute to request completed and signed FL-2 Forms from their Primary Care Physician, Dr. Hyman Bible so that CSW can fax to all assisted living facilities, as soon as possible.  CSW would like to have patient and Mr. Gausman securely placed, prior to Mr. Wormington having to go into the hospital for scheduled surgery.   CSW agreed to contact patient and Mr. Lucci as soon as bed offers are received so that CSW can schedule the next routine home visit.  In the meantime, patient and Mr. Clapsaddle have been encouraged to contact CSW if they have questions or concerns during this entire placement process.  Patient admits that placement is the best  option for her and Mr. Hovan at this time, even though she is having a very difficult time coming to terms with it.  Mr. Butts admits that he is no longer able to adequately care for patient in the home, which is a major source of concern for him.  CSW offered counseling and supportive services, where appropriate. [name], [name], [name], [name]  Licensed Engineer, petroleum Health System  Mailing [place] [address] [place], [place] [address] Physical Address-[address], [place], [place] [address] Toll Free Main # [phone] Fax # [phone] Cell # [phone]  Office # [phone] [name]

## 2017-01-08 NOTE — Telephone Encounter (Signed)
Returned patient's call. She wanted to let me know that she is currently taking 1 tablet of Trazodone and is doing well with this.  She also wanted to discuss going to a long term care facility. She has been working with [facility] social workers about placement in a nursing home along with her brother [name]. She states they are having a hard time deciding if they want to go to a long term care facility. She does not feel that she can give up her cat. I agreed that this is a very tough decision to make. I will try to get in touch with the [facility] social worker on Friday 1/26 to discuss her options further. I explained to the patient that I do feel long term placement will be best for them, as I share SW's concerns about [name] and Anaira's ability to take care of each other by themselves at home.  Hyman Bible, MD

## 2017-01-08 NOTE — Telephone Encounter (Signed)
Pt is returning Dr. Velia Meyer call. She said that she would be home this afternoon/ jw

## 2017-01-08 NOTE — Telephone Encounter (Signed)
Returned patient's call but the phone went to voicemail. Left voicemail that patient should call us back when she gets a chance. Otherwise I will try to call her later today.

## 2017-01-09 ENCOUNTER — Other Ambulatory Visit: Payer: Self-pay | Admitting: *Deleted

## 2017-01-09 NOTE — Patient Outreach (Signed)
Joint home visit with [name], [name], pt and her brother.  Discussions were around placement for both pt and her brother. Instructions were given to begin organizing and packing up the household.  Provided unnaboot dressing change: Removed old boots, washed, rinsed and dried pt legs. Edema appears to be equally as severe as last week. Skin condition is improved, however. I applied a thick emollient cream and applied the unnaboots more tightly this time and covered with coban. I instructed pt to keep her legs elevated at all times unless she is up moving about the home.  I will see her again next week.  [name] [facility] [place] [phone]

## 2017-01-14 ENCOUNTER — Other Ambulatory Visit: Payer: Self-pay | Admitting: *Deleted

## 2017-01-14 ENCOUNTER — Encounter: Payer: Self-pay | Admitting: *Deleted

## 2017-01-14 VITALS — BP 140/90 | HR 75 | Resp 18 | Ht 62.0 in | Wt >= 6400 oz

## 2017-01-14 DIAGNOSIS — I890 Lymphedema, not elsewhere classified: Secondary | ICD-10-CM

## 2017-01-14 NOTE — Patient Outreach (Signed)
[place] [facility]) Care Management  01/14/2017  Kathleen Roy [birth year] [id number]  CSW was able to make contact with patient today to follow-up regarding long-term care placement arrangements in an assisted living facility.  CSW explained to patient that CSW will be dropping off blank FL-2 Forms to Dr. Alejandro Mulling (Primary Care Physician) office later this afternoon, as CSW was informed by the receptionist that they currently do not have any forms on file.  CSW reiterated that once the FL-2 Forms are completed and signed, CSW will fax them to all assisted living facilities of choice to pursue bed offers for patient and her brother, [name], hoping to get them both placed in the same facility. Patient and CSW spoke about the impact that patient's health is currently having on Mr. Ludy health, as Mr. Bartosh is no longer able to assist patient in and out of bed in the mornings and evenings, resulting in patient having to sleep in her recliner.  Mr. Dukett is currently undergoing treatment for cancer and is experiencing weakness and fatigue.  CSW suggested that patient consider home health physical therapy to try and regain enough strength so that she is capable of ambulating more than a few feet, with just the use of an assistance device. Patient is so fearful of standing due to muscle pain and weakness, as well as chronic knee and lumbar back pain.  CSW placed a call to [name], Geriatric Nurse Practitioner with [facility], who agreed to broach the subject with patient and place the order during her next scheduled home visit.  CSW agreed to follow-up with patient in one week, unless CSW had news to report regarding placement, in the meantime.  Patient voiced understanding and was agreeable to this plan. [name], [name], [name], [name]  Licensed Education officer, environmental Health System  Mailing [place] [address], [place], [place] [address] Physical Address-[address], [place], [place] [address] Toll Free Main # [phone] Fax # [phone] Cell # [phone]  Office # [phone] [email or web address]

## 2017-01-14 NOTE — Patient Outreach (Addendum)
[facility] [facility]) Care Management   01/14/2017  Kathleen Roy 03-18-1961 [id number]  Kathleen Roy is an 56 y.o. female  Subjective: Pt has been followed by [facility] Management since 11/28/16. She has not been able to go to her primary care provider because of her inability to ambulate more than a few steps and her home does not have a handicaped ramp.   Pt does not take her glucotrol. She has not been checking her glucose levels in quite awhile. She eats one meal a day which is usually a Jimmie Dean[employer] Croissant Sandwich and a Kellogg.  [facility] NP has been coming weekly X 4 to apply unna boots. Skin on R lateral calf has improved with this, however the edema has not improved. Pt removed unaboots on Friday because they were too painful/tight.  The [facility] staff assisted in moving pt furniture to allow pt recliner to go all the way back and get her legs elevated, 2 weeks ago.  Pt no longer goes to bed because she can get up and feel like she cannot move any further.  Objective:   Review of Systems  Constitutional: Positive for malaise/fatigue.  HENT: Negative.  Negative for hearing loss.   Eyes: Positive for blurred vision.  Respiratory: Positive for shortness of breath.   Cardiovascular: Positive for chest pain and leg swelling.  Gastrointestinal: Positive for constipation and heartburn.  Genitourinary: Negative.   Musculoskeletal: Positive for back pain, joint pain, myalgias and neck pain.  Skin: Positive for rash.  Neurological: Positive for tingling and weakness.  Endo/Heme/Allergies: Negative.   Psychiatric/Behavioral: Positive for depression. The patient is nervous/anxious.    BP 140/90 (BP Location: Left Arm, Patient Position: Sitting, Cuff Size: Large)   Pulse 75   Resp 18   Ht 1.575 m (5\' 2" )   Wt (!) 400 lb (181.4 kg)   SpO2 94% Comment: On 4 L O2  BMI 73.16 kg/m   FBS: 68  Physical Exam  Constitutional: She is oriented to person, place, and time. She appears  well-developed and well-nourished.  HENT:  Head: Normocephalic and atraumatic.  Cardiovascular: Normal rate, regular rhythm and normal heart sounds.   Respiratory: Effort normal and breath sounds normal.  GI: Soft. Bowel sounds are normal.  Musculoskeletal: She exhibits edema and tenderness.  Very limited. Hard to stand from sitting and very difficult to lift one foot after the other for a few step. Can slowly maneuver to go to potty chair which her brother moves to her.  Neurological: She is alert and oriented to person, place, and time.  Skin: Skin is warm.  R lateral upper calf wound is healed. There are multiple fluid blisters on RLE. There is some fluid seepage in the R latereal ankle.  Psychiatric:  Depressed and anxious.    Encounter Medications:   Outpatient Encounter Prescriptions as of 01/14/2017  Medication Sig Note  . ARIPiprazole (ABILIFY) 2 MG tablet Take 1 tablet (2 mg total) by mouth every other day.   . Ascorbic Acid (VITAMIN C) 1000 MG tablet Take 3,000 mg by mouth at bedtime.    Marland Kitchen atorvastatin (LIPITOR) 10 MG tablet Take 1 tablet (10 mg total) by mouth at bedtime.   . cetirizine (ZYRTEC) 10 MG tablet Take 10 mg by mouth daily.   . cyclobenzaprine (FLEXERIL) 10 MG tablet Take 1 tablet (10 mg total) by mouth 3 (three) times daily as needed for muscle spasms.   . Emollient (EUCERIN) lotion Apply topically as needed for  dry skin. To back of leg   . fluticasone furoate-vilanterol (BREO ELLIPTA) 100-25 MCG/INH AEPB Inhale 1 puff into the lungs daily as needed (for SOB).    . meclizine (ANTIVERT) 25 MG tablet Take 25 mg by mouth every 6 (six) hours as needed for dizziness.   . nystatin (MYCOSTATIN) powder Apply topically 2 (two) times daily. Apply to affected skin BID (Patient taking differently: Apply topically 2 (two) times daily as needed. TO AFFECTED AREAS)   . Omega-3 Fatty Acids (FISH OIL) 1000 MG CAPS Take 2 capsules (2,000 mg total) by mouth at bedtime.   Marland Kitchen  oxyCODONE-acetaminophen (PERCOCET) 10-325 MG tablet Take 1 tablet by mouth every 6 (six) hours as needed for pain.   . OXYGEN Inhale 4 L into the lungs continuous. 4l/min    . polyethylene glycol (MIRALAX / GLYCOLAX) packet Take 17 g by mouth daily as needed for mild constipation.    . potassium chloride SA (K-DUR,KLOR-CON) 20 MEQ tablet take 2 tablets by mouth once daily   . pregabalin (LYRICA) 150 MG capsule Take 1 capsule (150 mg total) by mouth 2 (two) times daily.   . Psyllium (EQ DAILY FIBER PO) Take 1 capsule by mouth daily.   Marland Kitchen torsemide (DEMADEX) 20 MG tablet Take 20 mg by mouth 2 (two) times daily. 2 tabs bid.   . traZODone (DESYREL) 150 MG tablet take 2 tablets by mouth at bedtime   . venlafaxine XR (EFFEXOR-XR) 150 MG 24 hr capsule Take 2 capsules (300 mg total) by mouth daily.   Marland Kitchen albuterol (PROVENTIL HFA;VENTOLIN HFA) 108 (90 Base) MCG/ACT inhaler Inhale 2 puffs into the lungs every 4 (four) hours as needed for wheezing or shortness of breath. (Patient not taking: Reported on 01/14/2017)   . glipiZIDE (GLUCOTROL) 5 MG tablet take 1 tablet by mouth once daily (Patient not taking: Reported on 01/14/2017)   . [DISCONTINUED] cephALEXin (KEFLEX) 500 MG capsule Take 1 capsule (500 mg total) by mouth 4 (four) times daily.   . [DISCONTINUED] torsemide (DEMADEX) 20 MG tablet Take 40 mg by mouth in the AM and 20 mg by mouth in the PM. (Patient taking differently: Take 40 mg by mouth 2 (two) times daily. ) 01/14/2017: Takes this bid.   No facility-administered encounter medications on file as of 01/14/2017.     Functional Status:   In your present state of health, do you have any difficulty performing the following activities: 01/14/2017 12/17/2016  Hearing? N N  Vision? N N  Difficulty concentrating or making decisions? Tempie Donning  Walking or climbing stairs? Y Y  Dressing or bathing? Y Y  Doing errands, shopping? Tempie Donning  Preparing Food and eating ? Y Y  Using the Toilet? Y Y  In the past six months,  have you accidently leaked urine? Y Y  Do you have problems with loss of bowel control? Y Y  Managing your Medications? N Y  Managing your Finances? N Y  Housekeeping or managing your Housekeeping? Tempie Donning  Some recent data might be hidden    Fall/Depression Screening:    PHQ 2/9 Scores 12/17/2016 02/14/2016 04/25/2015 03/21/2015 01/20/2015 11/25/2014 11/08/2014  PHQ - 2 Score 2 1 3 6  0 0 2  PHQ- 9 Score 8 - 14 18 - - -    Assessment:  Gross bilateral LLE 4+                         Skin improved however some fluid  seepage on R ankle. R Lateral calf wound has closed.                         Decreasing abiliity to walk.  Plan:   Unnaboots applied bilaterally to LE.  Patient need PT OT and [place] to do weekly unnaboots for the duration of the PT/OT therapy.              I will return to see pt in 2 weeks.              I have requested that pt and brother make plan A and B for when her brother goes into the hospital for his colon surgery. This will require her to go to                    a LTC facility probably and ALF at least temporarily and depending on the prognosis of her brother this may need to be on a longer term basis.  [name] [facility] [place] [facility] [phone]

## 2017-01-17 NOTE — Addendum Note (Signed)
Addended by: Hyman Bible D on: 01/17/2017 09:22 AM   Modules accepted: Orders

## 2017-01-20 ENCOUNTER — Telehealth: Payer: Self-pay | Admitting: Internal Medicine

## 2017-01-20 NOTE — Telephone Encounter (Signed)
Pt needs a refill hydrocodone. ep

## 2017-01-21 ENCOUNTER — Other Ambulatory Visit: Payer: Self-pay | Admitting: *Deleted

## 2017-01-21 NOTE — Patient Outreach (Signed)
[place] [facility]) Care Management  01/21/2017  Kathleen Roy 07/02/1961 [id number]  CSW was able to make contact with patient today to follow-up regarding long-term care placement arrangements in an assisted living facility versus respite care in an acute care setting, just while patient's brother is hospitalized to receive surgery.  Patient reports that she and her brother, [name] have decided that patient will only go into a respite care facility long enough to receive the care and supervision she needs while Mr. Pettway is hospitalized and while he is undergoing rehabilitative services.  CSW explained to patient that CSW is still awaiting a completed and signed FL-2 Form from patient's Primary Care Physician, Dr. Hyman Bible.  Once patient's FL-2 Form is received, CSW will fax to all respite care facilities within a 50-mile radius to pursue bed offers.   CSW was able to converse with [name], Geriatric Nurse Practitioner with [facility], who reports that she was able to speak with patient's landlord who has agreed to withhold patient's rent payment for the period of time that Mr. Plock is hospitalized and patient is placed in a respite care facility.  Patient and Mr. Feher were denied financial assistance to obtain supplies and installation of a wheelchair ramp for home use through [employer], as the front porch of patient's rental home does not meet code.  CSW will make another outreach attempt in one week to further pursue placement arrangements for patient. [name], [name], [name], [name]  Licensed Education officer, environmental Health System  Mailing [place] [address] [place], [place] [address] Physical Address-[address], [place], [place] [address] Toll Free Main # [phone] Fax # [phone] Cell # [phone]  Office # [phone] [email or web address]

## 2017-01-21 NOTE — Patient Outreach (Signed)
Routine home visit for weekly unnaboot change. Removed unnaboots, more edema noted than last week. Skin is pink and has orange peel appearance. There are no open areas today. Washed legs with soap and water, rinsed, applied emollient, applied unnaboots bilaterally and covered with coban.  Pt reports increased somnolence. She admits to reduced activity, staying in her recliner around the clock and only using the bedside commode right beside the recliner. She reports she has become afraid that she may not make it any distance when walking.  Janith's brother has met with his oncologist and they will move forward for his surgery.  Arriel knows she will have to be placed for her brother's surgical procedure and recovery. They both hope to return to their home.  Pt still has not received any new contacts from new home health agency that can provide PT/OT and Nursing and aid services.  [name] [facility] [place] [phone]

## 2017-01-22 NOTE — Telephone Encounter (Signed)
Patient calls again, patient will be out of pain meds TODAY. Patient would like RX printed today if possible.

## 2017-01-22 NOTE — Telephone Encounter (Signed)
Will forward to MD. [name],CMA  

## 2017-01-23 MED ORDER — OXYCODONE-ACETAMINOPHEN 10-325 MG PO TABS: 1.0000 | ORAL_TABLET | Freq: Four times a day (QID) | ORAL | 0 refills | Status: DC | PRN

## 2017-01-23 NOTE — Telephone Encounter (Signed)
Please let [name] and [facility] know that their prescriptions are ready to be picked up!

## 2017-01-23 NOTE — Telephone Encounter (Signed)
[name] and Butch Penny informed of rx ready for pick up.

## 2017-01-24 ENCOUNTER — Other Ambulatory Visit: Payer: Self-pay | Admitting: Internal Medicine

## 2017-01-24 MED ORDER — OXYCODONE-ACETAMINOPHEN 10-325 MG PO TABS: 1.0000 | ORAL_TABLET | Freq: Four times a day (QID) | ORAL | 0 refills | Status: DC | PRN

## 2017-01-24 NOTE — Telephone Encounter (Signed)
Will forward to PCP.  Martin, Tamika L, RN  

## 2017-01-24 NOTE — Telephone Encounter (Signed)
Kathleen Roy states the instructions on the medication changed. On the bottle it says take 1 every 8 hours and was given 30 tablets. Pt states she normally takes 1 every 6 hours and gets more than 30 tablets. Please call pt and let her know if the instructions on the bottle are correct. ep

## 2017-01-26 DIAGNOSIS — J9611 Chronic respiratory failure with hypoxia: Secondary | ICD-10-CM | POA: Diagnosis not present

## 2017-01-29 ENCOUNTER — Other Ambulatory Visit: Payer: Self-pay | Admitting: *Deleted

## 2017-01-29 NOTE — Patient Outreach (Signed)
[facility]) Care Management  01/29/2017  Kathleen Roy [birth year] [id number]  CSW was able to make contact with patient today, after having spoken with her brother, Pamalla Naval, receiving the news that Mr. Casida cancer has spread, to offer counseling and supportive services to patient.  [name] appeared to be somewhat distraught, not wanting to converse over the phone for any length of time.  CSW was able to ensure that patient is aware of support groups offered at the [facility].  Patient indicated that she is not interested in attending any support groups at this time, not wanting to engage with anyone but [name].  CSW voiced understanding and explained that CSW will also be available to patient and [name] during this difficult time in their lives, as CSW will continue to follow along to assist patient with making short-term placement arrangements in a skilled facility for respite care, while [name] is hospitalized and during recovery.  CSW agreed to follow-up with patient and [name] in a few weeks, when CSW returns to the office from vacation.  However, patient was encouraged to contact the main [facility] office if social work services are needed in the meantime.  Patient was agreeable to this plan. [name], [name], [name], [name]  Licensed Education officer, environmental Health System  Mailing [place] [address], [place], [place] [address] Physical Address-[address], [place], [place] [address] Toll Free Main # [phone] Fax # [phone] Cell # [phone]  Office # [phone] [name]

## 2017-02-04 ENCOUNTER — Other Ambulatory Visit: Payer: Self-pay | Admitting: *Deleted

## 2017-02-05 NOTE — Patient Outreach (Addendum)
[facility]) Care Management  02/05/2017  Kathleen Roy [birth year] [id number]   Home visit for application of unnaboots.  Pt has kept the dressings on this week. Dressings removed. Legs washed with soap and water, rinsed. Emollient applied liberally. There are some areas of redness where there is increased edema and creases in the skin. The skin is not dappled nor has that orange peel surface today. Her R outer lower extremity has a strip of crusty skin which is chronic and an area on the proximal area is moist and pink.  I have reapplied the unna boots bilaterally.  Of note, I performed this procedure last week on 01/28/17 and did not document that intervention.   We are still waiting for a surgical date for [name]'s brother. [name] has asked if we can try to get her into [facility] during the time her brother is in recovery.  I have reviewed [name]'s Creatine level which is  0.83 and I am going to ask her to increase her Demadex 20 mg daily to bid and check a BMET next week.  [name] [facility] [place] [phone]

## 2017-02-07 ENCOUNTER — Other Ambulatory Visit: Payer: Self-pay | Admitting: *Deleted

## 2017-02-07 NOTE — Patient Outreach (Signed)
Acute visit for BMET lab draw. Attempted to draw blood X 3 without success. I will attempt this again on Tuesday.  [name] [facility] [place] [phone]

## 2017-02-11 ENCOUNTER — Other Ambulatory Visit (HOSPITAL_COMMUNITY): Payer: Self-pay

## 2017-02-11 ENCOUNTER — Ambulatory Visit: Payer: Self-pay | Admitting: *Deleted

## 2017-02-11 DIAGNOSIS — R601 Generalized edema: Secondary | ICD-10-CM

## 2017-02-11 NOTE — Progress Notes (Unsigned)
bmet  

## 2017-02-12 ENCOUNTER — Telehealth: Payer: Self-pay | Admitting: Internal Medicine

## 2017-02-12 NOTE — Telephone Encounter (Signed)
Kathleen Roy would like to talk to Kathleen Roy ASAP.  Kathleen Roy would not divulge what it was about.

## 2017-02-13 ENCOUNTER — Telehealth: Payer: Self-pay | Admitting: Internal Medicine

## 2017-02-13 NOTE — Telephone Encounter (Signed)
Spoke with patient and she states that she a broken tooth on the top and this has been causing problems with her tongue since she is biting it and it's becoming raw.  Patient states that she reached out to her oral surgeon Dr. Laurena Slimmer and they couldn't guarantee her that they would be able to do both the evaluation and the procedure in the same day.  Patient was concerned because she has to rely on an ambulance service to get her from the home and take her to the appointments, and these are $500 each time trips.   Patient states that she would like to try and reach out to [facility] and see what they can offer her.  I did look into this option for patient and advised her that she would have to apply, be placed on a month drawing list and then she would still have to be screened.  Patient was also informed that it wouldn't be covered by her insurance and she would have to weigh the options.  Paying out of pocket for the procedure and possible increase in transportation fee vs. Paying for 2 transportation fees here in town.  Patient opted to stay with Dr. Sabra Heck and will check with the ambulance service to see how much time they need for scheduling.  Dr. Ammie Ferrier offer will at least offer temporary relief if a second appointment is needed so patient decided that this was worth it.  Will wait to hear from patient and then will call her surgeons office and assist her in making this appointment.  Will forward to MD to keep her updated on patient's status. [name],CMA

## 2017-02-13 NOTE — Telephone Encounter (Signed)
[facility] will transport. They will only need 1 day notice.  Their number is [phone]. Please make the appt with Dr Sabra Heck as late in the day as possible and let the dr know Kathleen Roy is on oxygen

## 2017-02-13 NOTE — Telephone Encounter (Signed)
Thank you so much for all your help.

## 2017-02-14 ENCOUNTER — Other Ambulatory Visit: Payer: Self-pay | Admitting: *Deleted

## 2017-02-14 NOTE — Telephone Encounter (Signed)
Per patient on 02-13-17 :[facility] will transport. They will only need 1 day notice.  Their number is [phone]. Please make the appt with Dr Sabra Heck as late in the day as possible and let the dr know [place] is on oxygen.  Called Dr. Broadus John miller's office and spoke with [place].  She will call patient to schedule this due to needing some background information and then patient will contact me with the date so I can help schedule her transportation if she needs me to.  Johnney Ou

## 2017-02-14 NOTE — Telephone Encounter (Signed)
Documented this is my ongoing encounter for this subject on patient.  [name],CMA

## 2017-02-14 NOTE — Patient Outreach (Signed)
[facility]) Care Management  02/14/2017  Kathleen Roy Apr 22, 1961 [id number]   CSW was able to make brief contact with patient today to follow-up regarding short-term respite care in an assisted living facility for patient, while her brother, Kurston Minervini, also patient's primary caregiver, undergoes surgery, as patient is unable to care for herself independently in the home.  Patient was not feeling well today, indicating that she chipped her tooth, which was causing her a great deal of pain.  Patient was able to make arrangements to see a dental surgeon regarding a tooth extraction, but is forced to suffer through the pain, in the meantime.  Patient admitted that she cuts into her tongue and gums whenever she tries to talk, so CSW limited her conversation with patient. Patient and or Mr. Antilla have been encouraged to contact CSW as soon as Mr. Gladman surgery date is scheduled so that CSW can begin making arrangement for placement for patient.  Patient indicated that she would really like to be placed at [facility], hopeful that Mr. Hebron will be placed there too, after his surgical procedure and release from the hospital.  CSW agreed to pursue this option for patient.  CSW offered counseling and supportive services where appropriate.  CSW agreed to contact patient and Mr. Whynot in two weeks, if CSW does not receive a return call from either of them, in the meantime. [name], [name], [name], [name]  Licensed Education officer, environmental Health System  Mailing [place] [address], [place], [place] [address] Physical Address-[address], [place], [place] [address] Toll Free Main # [phone] Fax # [phone] Cell # [phone]  Office # [phone] [email or web address]

## 2017-02-16 ENCOUNTER — Other Ambulatory Visit: Payer: Self-pay | Admitting: *Deleted

## 2017-02-16 NOTE — Patient Outreach (Signed)
Home visit for unnaboot change. Pt is doing fair. She reports she has a broken tooth and showed it to me. I observed that she has a jagged tooth on the upper L side. 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 Her brother still does not have a surgical date.  Removed bilateral unnaboots. Washed legs with soap and water and rinsed them. Applied emollient to both legs.Her skin appears improved, no fluid is weeping today and there are no erythymic areas.  Unnaboots applied.  I attempted to draw blood for a BMET and was again unsuccessful. I did not have Mrs. Mccranie increase her demadex as I wanted to get this baseline lab before increasing her diuretic.  I believe this intervention will have to wait until she is admitted to SNF and a plebotomist can attempt to draw this lab.  I would also like to see if we can get air compression boots to move some of the fluid out of her legs.  I will see her again next week.  [name] [facility] [place] [phone]

## 2017-02-18 ENCOUNTER — Other Ambulatory Visit: Payer: Self-pay | Admitting: *Deleted

## 2017-02-18 DIAGNOSIS — K0889 Other specified disorders of teeth and supporting structures: Secondary | ICD-10-CM | POA: Diagnosis not present

## 2017-02-18 DIAGNOSIS — R531 Weakness: Secondary | ICD-10-CM | POA: Diagnosis not present

## 2017-02-18 NOTE — Patient Outreach (Signed)
[facility]) Care Management  02/18/2017  DENALI SMITHER 20-Dec-1960 [id number]   Routine home visit.  S:  Brittnie's brother's surgery has been scheduled for March 14th. They were just advised of this on Monday and are in shock that they have to both get ready to leave their home temporarily during his recovery. Eshani wants to go to [facility] as she has been there before and felt like she was well care for there.   Denea is no longer walking at all. It is all she can do to stand with her walker and transfer to her potty chair. Her knees are very painful and weak. Her brother has been assisting her with all ADL/IADLs.  Outpatient Encounter Prescriptions as of 02/18/2017  Medication Sig  . albuterol (PROVENTIL HFA;VENTOLIN HFA) 108 (90 Base) MCG/ACT inhaler Inhale 2 puffs into the lungs every 4 (four) hours as needed for wheezing or shortness of breath. (Patient not taking: Reported on 01/14/2017)  . ARIPiprazole (ABILIFY) 2 MG tablet Take 1 tablet (2 mg total) by mouth every other day.  . Ascorbic Acid (VITAMIN C) 1000 MG tablet Take 3,000 mg by mouth at bedtime.   Marland Kitchen atorvastatin (LIPITOR) 10 MG tablet Take 1 tablet (10 mg total) by mouth at bedtime.  . cetirizine (ZYRTEC) 10 MG tablet Take 10 mg by mouth daily.  . cyclobenzaprine (FLEXERIL) 10 MG tablet Take 1 tablet (10 mg total) by mouth 3 (three) times daily as needed for muscle spasms.  . Emollient (EUCERIN) lotion Apply topically as needed for dry skin. To back of leg  . fluticasone furoate-vilanterol (BREO ELLIPTA) 100-25 MCG/INH AEPB Inhale 1 puff into the lungs daily as needed (for SOB).   Marland Kitchen glipiZIDE (GLUCOTROL) 5 MG tablet take 1 tablet by mouth once daily (Patient not taking: Reported on 01/14/2017)  . meclizine (ANTIVERT) 25 MG tablet Take 25 mg by mouth every 6 (six) hours as needed for dizziness.  . nystatin (MYCOSTATIN) powder Apply topically 2 (two) times daily. Apply to affected skin BID (Patient taking  differently: Apply topically 2 (two) times daily as needed. TO AFFECTED AREAS)  . Omega-3 Fatty Acids (FISH OIL) 1000 MG CAPS Take 2 capsules (2,000 mg total) by mouth at bedtime.  Marland Kitchen oxyCODONE-acetaminophen (PERCOCET) 10-325 MG tablet Take 1 tablet by mouth every 6 (six) hours as needed for pain.  . OXYGEN Inhale 4 L into the lungs continuous. 4l/min   . polyethylene glycol (MIRALAX / GLYCOLAX) packet Take 17 g by mouth daily as needed for mild constipation.   . potassium chloride SA (K-DUR,KLOR-CON) 20 MEQ tablet take 2 tablets by mouth once daily  . pregabalin (LYRICA) 150 MG capsule Take 1 capsule (150 mg total) by mouth 2 (two) times daily.  . Psyllium (EQ DAILY FIBER PO) Take 1 capsule by mouth daily.  Marland Kitchen torsemide (DEMADEX) 20 MG tablet Take 20 mg by mouth 2 (two) times daily. 2 tabs bid.  . traZODone (DESYREL) 150 MG tablet take 2 tablets by mouth at bedtime  . venlafaxine XR (EFFEXOR-XR) 150 MG 24 hr capsule Take 2 capsules (300 mg total) by mouth daily.   No facility-administered encounter medications on file as of 02/18/2017.     O:  BP 130/70 (BP Location: Left Arm, Patient Position: Sitting, Cuff Size: Large)   Pulse 72   Resp 20   SpO2 94% Comment: On 3L O2       RRR       Lungs are clear.  Legs remain extremely edematous despite the application of the unnaboots. After the unnaboots were removed and pt legs bathed, there is some mild erythema in both legs and there is some fluid weeping from upper, lateral R calf. There is an area of crusty skin all along the lateral aspect of the LLE which is chronic despite application of heavy emollient before unnaboots are applied.  A: Gross Peripheral Edema - unnaboots are only fair intervention - would suggest AIR COMPRESSION BOOTS at SNF to assist with fluid movement, BEDREST, STRICT LOW SODIUM, ADA 1800 CALORIE DIET, FLUID RESTRICTION OF 1.5 LITERS. LABS AND AN INCREASE IN HER TORSEMIDE IF HER RENAL FUNCTION IS ADEQUATE.  P:  Pt to go  to SNF facility within the week. I will ask Dr. Brett Albino to consider adding the above suggestions to maximize pt progress while under good medical oversight.       I would really like for her to agree to LTC placement permanently as she is so very limitied at home, but she of course wants to go home providing her brother recovers        and can assist her again.        It would be wonderful if during this placement she could loose some weight by being given a good nutritious diet and cutting the junk food she enjoys daily. She could        have such an improvement in her quality of life if she could become mobile again.  [name] [facility] [place] [phone]

## 2017-02-20 ENCOUNTER — Other Ambulatory Visit: Payer: Self-pay | Admitting: Internal Medicine

## 2017-02-20 MED ORDER — TORSEMIDE 20 MG PO TABS: 20.0000 mg | ORAL_TABLET | Freq: Two times a day (BID) | ORAL | 2 refills | Status: DC

## 2017-02-20 MED ORDER — VENLAFAXINE HCL ER 150 MG PO CP24: 300.0000 mg | ORAL_CAPSULE | Freq: Every day | ORAL | 0 refills | Status: DC

## 2017-02-20 NOTE — Telephone Encounter (Signed)
[facility] Pharmacist called needing clarification in the torsemide sig.  The sig states take 1 tablet PO BID; 2 tabs BID. Which direction is correct? Please advise.  Derl Barrow, RN

## 2017-02-20 NOTE — Telephone Encounter (Signed)
She should take 1 tablet (20mg ) twice a day. Thanks!

## 2017-02-20 NOTE — Telephone Encounter (Signed)
Pt calling to request refill of:  Name of Medication(s):  Effexor XR & torsemide Last date of OV:   Pharmacy:  [facility].  Will route refill request to Clinic RN.  Discussed with patient policy to call pharmacy for future refills.  Also, discussed refills may take up to 48 hours to approve or deny.  Renella Cunas

## 2017-02-21 ENCOUNTER — Other Ambulatory Visit: Payer: Self-pay | Admitting: *Deleted

## 2017-02-21 ENCOUNTER — Ambulatory Visit: Payer: Self-pay | Admitting: *Deleted

## 2017-02-21 NOTE — Telephone Encounter (Signed)
Clarification given to pharmacist.

## 2017-02-22 NOTE — Patient Outreach (Signed)
[facility]) Care Management  02/22/2017  MAYAR WHITTIER 24-Jul-1961 [id number]   CSW had the misfortune of explaining to patient and patient's brother, Aino Heckert that patient was not accepted at [facility] for short-term rehabilitative services/respite care.  CSW was hopeful that patient would receive a bed offer, as CSW had faxed patient's FL-2 Form to [facility] on Monday, March 5th and told that the admissions coordinator would notify CSW as soon as they were ready to accept patient into their facility.  The admissions department was fully aware that patient needed to be admitted no later than Monday, March 12th, as Mr. Luera surgery is scheduled for Wednesday, March 14th, but he will need to undergo preparations for surgery as early as Tuesday, March 13th.  Patient even attempted to contact the admissions director at [facility] to try persuade them of her intent to cooperate with therapies when offered, with no success. In the meantime, CSW has faxed patient's FL- 2 Form to all Skilled Nursing Facilities within a 50-mile radius and is currently awaiting bed offers.  CSW attempted to follow-up with calls today, but no one, at any of the facilities, were available to speak with CSW about bed offers.  CSW will begin to diligently pursue attempts again first thing Monday morning.  CSW has communicated with patient, as well as [name], Geriatric Nurse Practitioner with [facility]. [name], [name], [name], [name]  Licensed Education officer, environmental Health System  Mailing [place] [address], [place], [place] [address] Physical Address-[address], [place], [place] [address] Toll Free Main # [phone] Fax # [phone] Cell # [phone]  Office # [phone] [email or web address]

## 2017-02-23 DIAGNOSIS — J9611 Chronic respiratory failure with hypoxia: Secondary | ICD-10-CM | POA: Diagnosis not present

## 2017-02-25 ENCOUNTER — Ambulatory Visit: Payer: Self-pay | Admitting: *Deleted

## 2017-02-25 ENCOUNTER — Other Ambulatory Visit: Payer: Self-pay | Admitting: *Deleted

## 2017-02-25 ENCOUNTER — Telehealth: Payer: Self-pay | Admitting: Internal Medicine

## 2017-02-25 NOTE — Patient Outreach (Signed)
[facility]) Care Management  02/25/2017  Kathleen Roy Jul 03, 1961 [id number]   CSW was able to make contact with patient today to follow-up with her regarding short-term skilled nursing facility placement, explaining that unfortunately, no bed offers have been received as of yet.  While present on the phone with patient, patient and her brother, Lashaun Poch were in the midst of a home visit with [name], Geriatric Nurse Practitioner with [employer] [facility].  CSW agreed to re-fax patient's FL-2 Form to all skilled nursing facilities within a 50-mile radius, with the exception of [place], [place] and [place].  CSW agreed to contact patient as soon as bed offers are received so that we can proceed with placement for short-term rehabilitative services.  Mr. Carda cancelled his surgery scheduled for Wednesday, March 14th, out of concern for patient not having 24 hour care and supervision in place.  Once patient is placed into a skilled nursing facility, Mr. Roads agreed to reschedule his surgery. [name], [name], [name], [name]  Licensed Education officer, environmental Health System  Mailing [place] [address], [place], [place] [address] Physical Address-[address], [place], [place] [address] Toll Free Main # [phone] Fax # [phone] Cell # [phone]  Office # [phone] [email or web address]

## 2017-02-25 NOTE — Telephone Encounter (Signed)
Pt states her potassium Rx was changed to 1 tablet twice a day, pt was taking 2 tablets twice a day. Pt states just taking 1 tablet twice a day is not enough. ep

## 2017-02-25 NOTE — Patient Outreach (Signed)
[place]) Care Management  02/25/2017  Kathleen Roy 1961-05-09 [id number]   Routine home visit for bilateral unnaboot change. Old boots removed, legs washed and rinsed, emollient applied and new unnaboots applied. I will return on Thursday March 22 for next change.  [name] [facility] [phone]

## 2017-02-26 ENCOUNTER — Other Ambulatory Visit: Payer: Self-pay | Admitting: *Deleted

## 2017-02-26 ENCOUNTER — Other Ambulatory Visit: Payer: Self-pay | Admitting: Internal Medicine

## 2017-02-26 MED ORDER — POTASSIUM CHLORIDE CRYS ER 20 MEQ PO TBCR: EXTENDED_RELEASE_TABLET | ORAL | 0 refills | Status: DC

## 2017-02-26 NOTE — Telephone Encounter (Signed)
Patient should be taking potassium 2 tablets twice a day.

## 2017-02-26 NOTE — Patient Outreach (Signed)
[facility]) Care Management  02/26/2017  TYA HAUGHEY 06-Oct-1961 [id number]   CSW was able to make contact with patient this today to explain that CSW has still not received any bed offers, after re-faxing patient's FL-2 Form to all skilled nursing facilities within a 50-mile radius.  Patient appeared to be somewhat discouraged, but CSW agreed to make some calls today to various facilities to try and pursue bed offers for patient.  Patient admitted to [place] that she spoke with a physical therapist from [facility] last evening, who agreed to "speak with the powers-that-be" to try and encourage them to admit patient back into their facility.  In the meantime, CSW agreed to contact Harriet Pho Admissions Psychiatric nurse at [facility] to try and persuade them to take patient, if patient's brother, Carys Malina is also agreeable to going there for recovery after his surgical procedure.  Mrs. Megan Salon agreed to make a few calls today to see if she is able to work something out with the admissions department.  Mrs. Megan Salon agreed to follow-up with CSW as soon as she has information to report.  CSW will then contact patient and Mr. Ambers to report findings. [name], [name], [name], [name]  Licensed Education officer, environmental Health System  Mailing [place] [address] [place], [place] [address] Physical Address-[address], [place], [place] [address] Toll Free Main # [phone] Fax # [phone] Cell # [phone]  Office # [phone] [email or web address]

## 2017-02-27 ENCOUNTER — Other Ambulatory Visit: Payer: Self-pay | Admitting: *Deleted

## 2017-02-27 ENCOUNTER — Other Ambulatory Visit: Payer: Self-pay | Admitting: Internal Medicine

## 2017-02-27 MED ORDER — OXYCODONE-ACETAMINOPHEN 10-325 MG PO TABS: 1.0000 | ORAL_TABLET | Freq: Four times a day (QID) | ORAL | 0 refills | Status: DC | PRN

## 2017-02-27 NOTE — Patient Outreach (Signed)
[facility]) Care Management  02/27/2017  Kathleen Roy 1961/11/19 [id number]   CSW was able to make contact with patient today to explain that CSW was not able to get patient or patient's brother, Manie Bealer into [facility], their first option; however, CSW did obtain placement for both of them at another skilled facility.  [facility] and they are willing to accept patient and Mr. Monterroso at any time, as they currently have female and female beds available.  CSW spoke with Mr. Boline, encouraging him to call and reschedule his surgical procedure, as soon as possible, so that CSW will have a better idea of when placement will be required for patient.  In addition, CSW requested that Mr. Minetti assist patient with completion of a Long-Term Care Medicaid application through the [place] (DSS), as this is not an application that can be completed on-line.  Patient and Mr. Zou agreed to reschedule Mr. Lueck surgery date, schedule an appointment with a representative from [facility] and then report findings to [place] so that [place] can keep in constant communication with [facility] so that we do not lose their beds.  Patient and Mr. Bergman voiced understanding and were both agreeable to this plan. [name], [name], [name], [name]  Licensed Education officer, environmental Health System  Mailing [place] [address], [place], [place] [address] Physical Address-[address], [place], [place] [address] Toll Free Main # [phone] Fax # [phone] Cell # [phone]  Office # [phone] [name]

## 2017-02-27 NOTE — Telephone Encounter (Signed)
Pt contacted, I spoke with her brother, [name]. Don informed of rx written and will be placed up front this afternoon. I informed [name] to speak with Pts NP about her torsemide, [name] voiced understanding.

## 2017-02-27 NOTE — Telephone Encounter (Signed)
Contacted pt- pt stated it was not the potassium she was inquiring about. Pt stated it was her Torsemide. Pt wants to take 2 pills 2 times per day. Pt stated he rx was written for 1 pill 2 times daily. Pt also stated she needs a refill on her oxycodone rx. Please advise.

## 2017-02-27 NOTE — Telephone Encounter (Signed)
Would recommend that Pt discuss this with [name], the nurse practitioner that has been coming out to her house from [facility]. Patient has not been seen in our clinic since June, so I am not able to accurately assess how much Torsemide she needs. Will refill her Oxycodone this afternoon and put it up front for Don to pick up. Thanks!

## 2017-02-28 ENCOUNTER — Ambulatory Visit: Payer: Medicare Other | Admitting: *Deleted

## 2017-03-03 ENCOUNTER — Telehealth: Payer: Self-pay | Admitting: Internal Medicine

## 2017-03-03 NOTE — Telephone Encounter (Signed)
Pt is confused about the instructions on her med for potassium-2 tabs by mouth twice a day. Her usual dosage is 2 tabs once a day.  For the torseomide-take one tab by mouth twice a day.  The normal was 2 tabs by month twice a day.  Please let pt know what the correct dosage is. She hasnt heard about her oxy codone refill

## 2017-03-03 NOTE — Telephone Encounter (Signed)
Will forward to MD to advise. Lorretta Kerce,CMA  

## 2017-03-04 ENCOUNTER — Other Ambulatory Visit: Payer: Self-pay | Admitting: Internal Medicine

## 2017-03-04 LAB — HM DIABETES EYE EXAM

## 2017-03-04 MED ORDER — POTASSIUM CHLORIDE CRYS ER 20 MEQ PO TBCR: EXTENDED_RELEASE_TABLET | ORAL | 0 refills | Status: DC

## 2017-03-04 MED ORDER — TORSEMIDE 20 MG PO TABS: ORAL_TABLET | ORAL | 2 refills | Status: DC

## 2017-03-04 NOTE — Telephone Encounter (Signed)
That's fine. I have no idea how much potassium she should actually been on because we have been unable to check her potassium level. She can continue to take Potassium 2 tabs once a day and torsemide 2 tabs twice a day. Her Percocet refill has been up at the front desk since last week. Thanks!

## 2017-03-04 NOTE — Telephone Encounter (Signed)
Spoke with brother and he is aware of information. Rylie Limburg,CMA

## 2017-03-05 ENCOUNTER — Ambulatory Visit: Payer: Self-pay | Admitting: *Deleted

## 2017-03-06 ENCOUNTER — Other Ambulatory Visit: Payer: Self-pay | Admitting: *Deleted

## 2017-03-06 NOTE — Patient Outreach (Signed)
[facility]) Care Management  03/06/2017  Kathleen Roy [birth year] [id number]   CSW received a call from patient and patient's brother, [name], to report that [name] has been rescheduled with surgery at [facility].  Dr. Johney Maine agreed to perform the surgery on Wednesday, May 2nd, but will consult with patient on Thursday, April 12th at noon.  CSW agreed to contact patient on the morning of April 12th to remind him of his appointment, as patient "can not" cancel this consult, the pre-operative consult or his surgery, or services will be terminated for him through the practice, as patient has a significant history of cancelling appointments.  Patient voiced understanding and was agreeable to this plan. CSW agreed to contact the admissions coordinator/social worker at [facility] that made bed offers on both patient and [name], to explain the change in plans.  CSW will inform [facility] that patient will be ready for placement on Friday, April 27th, with [name] following shortly thereafter.  CSW will contact [name], Geriatric Nurse Practitioner with [facility] to report findings of conversation with patient and [name] today. [name], [name], [name], [name]  Licensed Education officer, environmental Health System  Mailing [place] [address] [place], [place] [address] Physical Address-[address], [place], [place] [address] Toll Free Main # [phone] Fax # [phone] Cell # [phone]  Office # [phone] [email or web address]

## 2017-03-11 ENCOUNTER — Ambulatory Visit: Payer: Self-pay | Admitting: *Deleted

## 2017-03-17 ENCOUNTER — Ambulatory Visit: Payer: Self-pay | Admitting: *Deleted

## 2017-03-17 ENCOUNTER — Other Ambulatory Visit: Payer: Self-pay | Admitting: *Deleted

## 2017-03-17 NOTE — Patient Outreach (Signed)
[name]  03/17/2017  [name] Jun 09, 1961 [id number]   CSW received a call from patient today admitting that she is very nervous about upcoming skilled nursing placement arrangements.  Patient indicated that she is beginning to decline, thinking that she may actually need placement sooner, rather than later.  However, patient is also concerned that once she begins therapy, if she is unable to participate, or completes her 20 days of skilled care (under Medicare benefit), prior to her brother, [name] being released, will she be forced to return home alone.  CSW explained that this is why CSW requested that [name] complete a Long-Term Care Medicaid application on patient, so that she can move into a Long-Term Care Medicaid bed, once therapy is completed, if necessary.   CSW received a call from [name], Geriatric Nurse Practitioner with [facility], indicating that she will go ahead and update patient and patient's brother, [name]'s FL-2 Forms.  [name] will give these forms to CSW so that CSW can begin the process of pursuing bed offers again.  CSW agreed to follow-up with patient and [name] as soon as bed offers are received.  [name] agreed to go to the [facility] today to apply for [facility] for patient. [name], [name], [name], [name]  Licensed Education officer, environmental Health System  Mailing [place] [address] [place], [place] [address] Physical Address-[address], [place], [place] [address] Toll Free Main # [phone] Fax # [phone] Cell # [phone]  Office # [phone] [name]

## 2017-03-18 ENCOUNTER — Other Ambulatory Visit: Payer: Self-pay | Admitting: *Deleted

## 2017-03-18 ENCOUNTER — Ambulatory Visit: Payer: Self-pay | Admitting: *Deleted

## 2017-03-18 NOTE — Patient Outreach (Signed)
Late entry for home visit done on 03/06/17.  Home visit for unnaboot change. Pt legs appear without any redness or skin impairment. Took leg measurements today: ankle 17 inches, calf 23 inches. We discussed trying to progress from using the unnaboots each week to using support stockings. Pt is agreeable to this. I will pick up the stockings and we will start a trial with the stockings next week.  Removed old unnaboots, bathed legs with soap and water and rinsed them. Applied emollient then applied the unnaboots bilaterally. Covered the wraps with coban.  Next appt scheduled for 03/13/17.  [name] [facility] [phone]

## 2017-03-18 NOTE — Patient Outreach (Signed)
Late entry for home visit on 03/13/17.  Received support stockings from [employer] In [place]. These are size XX Large and have a wide calf and are 20-30 mm/Hg.  Unnaboots removed, legs washed, dried. Support stockings applied with great effort. Educated pt and her brother on the application and that she can keep these on for 2 days then change them. They can sprinkle a little powder on her legs before applying the stockings.  I will see her again in one week to assess how this new treatment has worked.  [name] [facility] [phone]

## 2017-03-18 NOTE — Patient Outreach (Addendum)
Routine home visit. 03/17/17  Pt reports the stockings came off on Friday due to an episode of urinary incontinence and they were soiled. She reports her brother could hardly get them off. She reports she has been having urinary incontinence episodes several times a day because she cannot get up in time to use the bedside commode or she sleeps through the voiding.  She also reports her brother says she still has a sore on her tailbone.  She voices concern about going to [facility] and feeling unsure if they will keep her after her SNF days end.  O:  Pt legs are very engorged, edematous and purple in color. She has many small fluid filled vesicles. No open lesions on her buttocks but she does have several stage I pressure areas: 1: Inner left buttock; 2) Upper posterior thigh  A: Worsening edema without support stockings or unnaboots on.      Functional urinary incontinence.      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 I opened up the bags on the bottom of her feet so she can stand without slipping.  Advised pt she must move around and get the blood flowing in her behind. This would be best done by getting up every 2 hours.   I have asked her to toilet herself every 4 hours whether she needs it or not.  Collaborate with Tad Moore, [name] and Dr. Brett Albino to complete new FL2 and ordered for SNF placement for at least 3 months.  I shared with the pt that I think the placement should be permanent because her brother WILL NOT be able to care for her after his surgery even if he has a full recovery. She needs to make some drastic changes in her decision making if she ever does want to come home which would include a conscientious effort to loose weight, follow her low calorie,  low salt diet and participate fully in the PT OT she will receive at the facility.  I am hopeful we can go ahead and get her placed ASAP.  Kathleen Roy [facility] [place] [phone]

## 2017-03-19 ENCOUNTER — Telehealth: Payer: Self-pay | Admitting: Internal Medicine

## 2017-03-19 ENCOUNTER — Other Ambulatory Visit: Payer: Self-pay | Admitting: Internal Medicine

## 2017-03-19 NOTE — Telephone Encounter (Signed)
FL2 form dropped off for at front desk for completion.  Verified that patient section of form has been completed.  Last DOS/WCC with PCP was 06/04/16.  Placed form in team folder to be completed by clinical staff.  Crista Luria

## 2017-03-19 NOTE — Telephone Encounter (Signed)
Clinical info completed on FL2 form.  Place form in Dr. Velia Meyer box for completion.  [name], [place]

## 2017-03-19 NOTE — Telephone Encounter (Signed)
LM for Kathleen Roy with [facility] that FL2 is ready for pick up and that we placed order for PT/OT but not sure which company will be taking patient on.  Asked her to call back and let me know if patient already has someone coming to her home that we can call and get a verbal order to. [facility]

## 2017-03-20 ENCOUNTER — Other Ambulatory Visit: Payer: Self-pay | Admitting: Internal Medicine

## 2017-03-20 NOTE — Telephone Encounter (Signed)
Pt needs refill on lyrica.  [facility] [address].

## 2017-03-21 MED ORDER — PREGABALIN 150 MG PO CAPS: 150.0000 mg | ORAL_CAPSULE | Freq: Two times a day (BID) | ORAL | 0 refills | Status: DC

## 2017-03-25 ENCOUNTER — Other Ambulatory Visit: Payer: Self-pay | Admitting: *Deleted

## 2017-03-25 NOTE — Patient Outreach (Signed)
[facility]) Care Management  03/25/2017  TIFANI DACK 28-Jul-1961 [id number]  Routine home visit.  S:  Pt is doing fair. She sleeps more than usual. She is often incontinent of urine. The last 3 times I have put unnaboots or stockings on she has had her brother take them off the next day because they became saturated with urine. She reports that her brother is having a hard time providing he ADLs due to his weakened state.  No SNF facilities have offered L[place] a bed.  O:  BP 100/60 (BP Location: Left Arm, Patient Position: Sitting, Cuff Size: Normal) Comment: Barely audible.  Pulse 85   Resp 20   SpO2 98%        RRR       Lungs are clear       Extremities: gross edema with venous congestion. Skin is orange peel-like in appearance. No open wounds.  A:  Dependent and needing nursing home placement. I fear we will have to hospitalize [place] in order to get a bed offer.       I will call pt next week to check in.       [name] is to go to the [facility] office to apply for [place].       I will request that [name] get her FL2 form to the [facility] office.  [name] [facility] [phone]

## 2017-03-26 ENCOUNTER — Other Ambulatory Visit: Payer: Self-pay | Admitting: *Deleted

## 2017-03-26 DIAGNOSIS — J9611 Chronic respiratory failure with hypoxia: Secondary | ICD-10-CM | POA: Diagnosis not present

## 2017-03-26 NOTE — Patient Outreach (Signed)
[facility]) Care Management  03/26/2017  [name] 29-Jan-1961 [id number]  CSW was able to make contact with patient today to follow-up regarding placement into a skilled nursing facility, as well as inform patient that CSW will drop off a copy of patient's completed and signed FL-2 Form so that she and her brother, Cayenne Breault can apply for [place] Medicaid coverage for patient.  The FL-2 Form will be dropped off today.  CSW explained to patient that she has only received one bed offer, thus far, but that CSW plans to call several skilled nursing facilities today to check bed availability. [name], [name], [name], [name]  Licensed Education officer, environmental Health System  Mailing [place] [address], [place], [place] [address] Physical Address-[address], [place], [place] [address] Toll Free Main # [phone] Fax # [phone] Cell # [phone]  Office # [phone] [email or web address]

## 2017-03-27 ENCOUNTER — Ambulatory Visit: Payer: Self-pay | Admitting: *Deleted

## 2017-03-27 ENCOUNTER — Other Ambulatory Visit: Payer: Self-pay | Admitting: Internal Medicine

## 2017-03-27 MED ORDER — ATORVASTATIN CALCIUM 10 MG PO TABS: 10.0000 mg | ORAL_TABLET | Freq: Every day | ORAL | 0 refills | Status: DC

## 2017-03-27 NOTE — Telephone Encounter (Signed)
Pt called and needs a refill on her Lipitor in 90 day qty called in. jw

## 2017-03-28 ENCOUNTER — Other Ambulatory Visit: Payer: Self-pay | Admitting: Internal Medicine

## 2017-03-28 ENCOUNTER — Other Ambulatory Visit: Payer: Self-pay | Admitting: *Deleted

## 2017-03-28 NOTE — Patient Outreach (Signed)
[facility]) Care Management  03/28/2017  Kathleen Roy 23-Dec-1960 [id number]   CSW received a call from patient today requesting information about payment at a skilled nursing facility, as well as questions about how she and her brother will pay their bills once they are both placed in skilled nursing facilities.  CSW explained to patient, in great detail, that Medicare will cover the first 20 days of care in a skilled setting, and then if patient is eligible for Long-Term Care Medicaid, it will pick up after day 41, leaving patient and her brother, [name] responsible for paying out of pocket for days 21 through 40.  CSW further explained to patient that patient's Social Security check will only be utilized for one month, before Long-Term Care Medicaid kicks in and picks up the expense of skilled care.  However, [name] is only expected to stay in a skilled setting for short-term rehabilitative services for 2 weeks to a month, at most; therefore, he will still receive his Social Security check to help pay their monthly expenses.  Patient voiced understanding but admits to having a lot of anxiety about being able to pay their monthly bills and outstanding loans.  CSW offered counseling and supportive services, where appropriate.  CSW agreed to follow-up with patient in one week.  No beds offers received thus far. [name], [name], [name], [name]  Licensed Education officer, environmental Health System  Mailing [place] [address] [place], [place] [address] Physical Address[address], [place],  [address] Toll Free Main # [phone] Fax # [phone] Cell # [phone]  Office # [phone] [name]

## 2017-03-31 ENCOUNTER — Encounter: Payer: Self-pay | Admitting: Internal Medicine

## 2017-04-03 ENCOUNTER — Other Ambulatory Visit: Payer: Self-pay | Admitting: *Deleted

## 2017-04-03 NOTE — Patient Outreach (Signed)
[facility]) Care Management  04/03/2017  Kathleen Roy 07/10/1961 [id number]   CSW received a call from patient last evening; however, CSW was unavailable at the time of patient's call.  Patient left a message for CSW on voicemail.  CSW was able to return patient's call today.  Unfortunately, CSW had to explain to patient that CSW has still not received any bed offers; however, CSW will make some calls within the next 24 hours to request bed availability.  Patient was pleased to inform CSW that she and her brother, Kathleen Roy have been approved to receive financial assistance for installation of a wheelchair ramp to the home they rent, for which they currently reside.  Supplies and labor will be covered at the expense of [employer].  CSW agreed to follow-up with patient and Mr. Gains within a week to report findings of phone conversations with skilled nursing facility administrators. [name], [name], [name], [name]  Licensed Education officer, environmental Health System  Mailing [place] [address] [place], [place] [address] Physical Address-[address], [place], [place] [address] Toll Free Main # [phone] Fax # [phone] Cell # [phone]  Office # [phone] [name]

## 2017-04-07 ENCOUNTER — Other Ambulatory Visit: Payer: Self-pay | Admitting: *Deleted

## 2017-04-07 NOTE — Patient Outreach (Signed)
[facility]) Care Management  04/07/2017  Kathleen Roy Nov 27, 1961 [id number]   CSW received a HIPAA compliant voicemail message from patient, as CSW was unavailable.  CSW was able to make contact with patient today to follow-up regarding skilled nursing placement arrangements.  CSW explained to patient that CSW is awaiting bed offers from [facility] and [facility], having left messages for the admissions coordinators at both facilities today.  CSW agreed to follow-up with patient as soon as CSW has information to report.  CSW reviewed financial assistance arrangements again with patient and patient's brother, [name].  CSW will be sure and follow-up with patient within the week. [name], [name], [name], [name]  Licensed Education officer, environmental Health System  Mailing [place] [address] [place], [place] [address] Physical Address-[address], [place], [place] [address] Toll Free Main # [phone] Fax # [phone] Cell # [phone]  Office # [phone] [email or web address]

## 2017-04-08 ENCOUNTER — Ambulatory Visit: Payer: Self-pay | Admitting: *Deleted

## 2017-04-09 ENCOUNTER — Other Ambulatory Visit: Payer: Self-pay | Admitting: *Deleted

## 2017-04-09 ENCOUNTER — Other Ambulatory Visit: Payer: Self-pay | Admitting: Internal Medicine

## 2017-04-09 NOTE — Patient Outreach (Signed)
Telephone call received from [facility] yesterday inquiring about her pending placement. I reassured her we are going to get her in a place by the weekend. She has previously had bed offers from [facility] for skilled care short term rehab, [facility], [facility] and [facility]. Pt is reluctant to go to [facility] or [facility], she would prefer [facility], then [facility]. I told her I am dedicating my day, Wednesday to get her placement confirmed.  I have called Tad Moore, [name], who is working with me on this placement. She is in a conference today, but was able to give me information I needed to assist with this. I called [facility] and talked with their new [name], Kirstin Dowd. She was most helpful and requested that we send in [place]'s FL2 form again. I have requested this from [facility] and have called the primary care provider to see if they kept a copy that could be refaxed.  Di Kindle was able to refax the newest edition of Ms. Bezold' FL@ to [facility].  [name] [facility] [place] [phone]

## 2017-04-10 ENCOUNTER — Other Ambulatory Visit: Payer: Self-pay | Admitting: *Deleted

## 2017-04-10 NOTE — Patient Outreach (Signed)
Telelphone call to [facility], [place], [place], to follow up and find out if they will be able to place my pt, Kathleen Roy. I had to leave a message on Kirstin's voice mail. I asked if they consent to receiving this placement, if we can plan on Friday afternoon. I requested a return call.  [name] [facility] [phone]

## 2017-04-11 ENCOUNTER — Other Ambulatory Visit: Payer: Self-pay | Admitting: *Deleted

## 2017-04-11 DIAGNOSIS — I5032 Chronic diastolic (congestive) heart failure: Secondary | ICD-10-CM | POA: Diagnosis not present

## 2017-04-11 DIAGNOSIS — J9611 Chronic respiratory failure with hypoxia: Secondary | ICD-10-CM | POA: Diagnosis not present

## 2017-04-11 DIAGNOSIS — J441 Chronic obstructive pulmonary disease with (acute) exacerbation: Secondary | ICD-10-CM | POA: Diagnosis not present

## 2017-04-11 DIAGNOSIS — J449 Chronic obstructive pulmonary disease, unspecified: Secondary | ICD-10-CM | POA: Diagnosis not present

## 2017-04-11 DIAGNOSIS — M797 Fibromyalgia: Secondary | ICD-10-CM | POA: Diagnosis not present

## 2017-04-11 DIAGNOSIS — E559 Vitamin D deficiency, unspecified: Secondary | ICD-10-CM | POA: Diagnosis not present

## 2017-04-11 DIAGNOSIS — I10 Essential (primary) hypertension: Secondary | ICD-10-CM | POA: Diagnosis not present

## 2017-04-11 DIAGNOSIS — F331 Major depressive disorder, recurrent, moderate: Secondary | ICD-10-CM | POA: Diagnosis not present

## 2017-04-11 DIAGNOSIS — E039 Hypothyroidism, unspecified: Secondary | ICD-10-CM | POA: Diagnosis not present

## 2017-04-11 DIAGNOSIS — G6289 Other specified polyneuropathies: Secondary | ICD-10-CM | POA: Diagnosis not present

## 2017-04-11 DIAGNOSIS — L03115 Cellulitis of right lower limb: Secondary | ICD-10-CM | POA: Diagnosis not present

## 2017-04-11 DIAGNOSIS — I872 Venous insufficiency (chronic) (peripheral): Secondary | ICD-10-CM | POA: Diagnosis not present

## 2017-04-11 DIAGNOSIS — G4733 Obstructive sleep apnea (adult) (pediatric): Secondary | ICD-10-CM | POA: Diagnosis not present

## 2017-04-11 DIAGNOSIS — E119 Type 2 diabetes mellitus without complications: Secondary | ICD-10-CM | POA: Diagnosis not present

## 2017-04-11 DIAGNOSIS — Z9049 Acquired absence of other specified parts of digestive tract: Secondary | ICD-10-CM | POA: Diagnosis not present

## 2017-04-11 DIAGNOSIS — E1142 Type 2 diabetes mellitus with diabetic polyneuropathy: Secondary | ICD-10-CM | POA: Diagnosis not present

## 2017-04-11 DIAGNOSIS — R609 Edema, unspecified: Secondary | ICD-10-CM | POA: Diagnosis not present

## 2017-04-11 DIAGNOSIS — G9009 Other idiopathic peripheral autonomic neuropathy: Secondary | ICD-10-CM | POA: Diagnosis not present

## 2017-04-11 DIAGNOSIS — E876 Hypokalemia: Secondary | ICD-10-CM | POA: Diagnosis not present

## 2017-04-11 DIAGNOSIS — M25562 Pain in left knee: Secondary | ICD-10-CM | POA: Diagnosis not present

## 2017-04-11 DIAGNOSIS — F339 Major depressive disorder, recurrent, unspecified: Secondary | ICD-10-CM | POA: Diagnosis not present

## 2017-04-11 DIAGNOSIS — F410 Panic disorder [episodic paroxysmal anxiety] without agoraphobia: Secondary | ICD-10-CM | POA: Diagnosis not present

## 2017-04-11 DIAGNOSIS — D649 Anemia, unspecified: Secondary | ICD-10-CM | POA: Diagnosis not present

## 2017-04-11 DIAGNOSIS — I509 Heart failure, unspecified: Secondary | ICD-10-CM | POA: Diagnosis not present

## 2017-04-11 DIAGNOSIS — E785 Hyperlipidemia, unspecified: Secondary | ICD-10-CM | POA: Diagnosis not present

## 2017-04-11 DIAGNOSIS — F411 Generalized anxiety disorder: Secondary | ICD-10-CM | POA: Diagnosis not present

## 2017-04-11 DIAGNOSIS — M25569 Pain in unspecified knee: Secondary | ICD-10-CM | POA: Diagnosis not present

## 2017-04-11 DIAGNOSIS — M25561 Pain in right knee: Secondary | ICD-10-CM | POA: Diagnosis not present

## 2017-04-11 DIAGNOSIS — G8929 Other chronic pain: Secondary | ICD-10-CM | POA: Diagnosis not present

## 2017-04-11 DIAGNOSIS — C20 Malignant neoplasm of rectum: Secondary | ICD-10-CM | POA: Diagnosis not present

## 2017-04-11 NOTE — Patient Outreach (Signed)
[facility]) Care Management  04/11/2017  Kathleen Roy Aug 13, 1961 [id number]   CSW was able to make contact with patient today to confirm with her that she will be placed at [facility] [place] where patient will initially receive short-term rehabilitative services, then possibly move into a long-term care bed.  Patient's application for Long-Term Care Medicaid is currently pending with the [place].  CSW will make arrangements for patient to be transported to [facility] via non-emergency ambulance transport through [facility].  Patient's FL-2 Form and orders have already been faxed to [name], Admissions Coordinator at [facility].  CSW will notify [name], Geriatric Nurse Practitioner with [facility] Management of patient's current plan of care.  CSW will follow-up with patient in less than two weeks to ensure that she is doing well at the facility, as well as assess and assist with possible discharge planning needs and services. [name], [name], [name], [name]  Licensed Education officer, environmental Health System  Mailing [place] [address], [place], [place] [address] Physical Address-[address], [place], [place] [address] Toll Free Main # [phone] Fax # [phone] Cell # [phone]  Office # [phone] [name]

## 2017-04-14 ENCOUNTER — Telehealth: Payer: Self-pay | Admitting: Internal Medicine

## 2017-04-14 ENCOUNTER — Other Ambulatory Visit: Payer: Self-pay

## 2017-04-14 MED ORDER — OXYCODONE-ACETAMINOPHEN 10-325 MG PO TABS: 1.0000 | ORAL_TABLET | Freq: Four times a day (QID) | ORAL | 0 refills | Status: DC | PRN

## 2017-04-14 MED ORDER — PREGABALIN 150 MG PO CAPS: 150.0000 mg | ORAL_CAPSULE | Freq: Two times a day (BID) | ORAL | 5 refills | Status: DC

## 2017-04-14 NOTE — Telephone Encounter (Signed)
RX faxed to [facility] @ [phone], phone number [phone] 

## 2017-04-14 NOTE — Telephone Encounter (Signed)
Pt was given lasix but it does no good.  Needs turosemide ( what she takes at home). She is getting 40 mg once a day on it. She needs 40 mg twice a day. She is at [facility] facility.  Please call pt

## 2017-04-15 ENCOUNTER — Non-Acute Institutional Stay (SKILLED_NURSING_FACILITY): Payer: Medicare Other | Admitting: Adult Health

## 2017-04-15 ENCOUNTER — Ambulatory Visit: Payer: Medicare Other | Admitting: *Deleted

## 2017-04-15 ENCOUNTER — Encounter: Payer: Self-pay | Admitting: Adult Health

## 2017-04-15 DIAGNOSIS — J9611 Chronic respiratory failure with hypoxia: Secondary | ICD-10-CM | POA: Diagnosis not present

## 2017-04-15 DIAGNOSIS — I5032 Chronic diastolic (congestive) heart failure: Secondary | ICD-10-CM | POA: Diagnosis not present

## 2017-04-15 DIAGNOSIS — I10 Essential (primary) hypertension: Secondary | ICD-10-CM

## 2017-04-15 DIAGNOSIS — J449 Chronic obstructive pulmonary disease, unspecified: Secondary | ICD-10-CM | POA: Diagnosis not present

## 2017-04-15 DIAGNOSIS — G6289 Other specified polyneuropathies: Secondary | ICD-10-CM

## 2017-04-15 DIAGNOSIS — F331 Major depressive disorder, recurrent, moderate: Secondary | ICD-10-CM

## 2017-04-15 DIAGNOSIS — F410 Panic disorder [episodic paroxysmal anxiety] without agoraphobia: Secondary | ICD-10-CM

## 2017-04-15 NOTE — Progress Notes (Signed)
Location:   [facility] Room Number: 121 A Place of Service:  SNF (31)   CODE STATUS: DNR  Allergies  Allergen Reactions  . Aspirin Hives    REACTION: hives Hasn't had in years     Chief Complaint  Patient presents with  . Acute Visit    follow up transfer from home     HPI:  She had been living at home with her brother who is her primary care giver. She tells me that he is going to have surgery for cancer and she is here while he has surgery and recovers. She tells me that this is not a long term placement. The staff tells me that she she looked uncared for when she arrived at the facility requiring a shower upon arriving to the facility.     Past Medical History:  Diagnosis Date  . Arthritis    "I feel like it's everywhere" (11/08/2014)  . Cellulitis and abscess of lower extremity 08/16/2016   right leg  . Chronic lower back pain   . COPD (chronic obstructive pulmonary disease) ([place])   . Depression   . Diabetes mellitus without complication ([facility])    type 2   . Fibromyalgia   . Headache    "at least weekly; it's usually when I first wake up" (11/08/2014)  . Hyperlipidemia   . Hypertension   . HYPERTENSION, BENIGN SYSTEMIC 02/12/2007  . Memory disturbance   . Migraines 1970's - <2000   "they just went away"  . Neuropathy   . Normal echocardiogram 05/30/05   suboptimal study  . Obesity, morbid (more than 100 lbs over ideal weight or BMI > 40) (H[facility])    obese since childhood  . Osteoarthritis   . Peripheral edema   . Pressure ulcer of foot    left  . Sepsis ([facility])   . Shortness of breath dyspnea   . Sleep apnea    "recently dx'd; haven't got my equipment yet" (11/08/2014)    Past Surgical History:  Procedure Laterality Date  . ABDOMINAL HERNIA REPAIR  2002  . APPENDECTOMY  1995  . CHOLECYSTECTOMY  1995  . TOTAL ABDOMINAL HYSTERECTOMY  1994    Social History   Social History  . Marital status: Single    Spouse name: N/A  . Number of  children: 0  . Years of education: college-1   Occupational History  . Disabled     severe depression and morbid obesity   Social History Main Topics  . Smoking status: Former Smoker    Packs/day: 1.00    Years: 30.00    Types: Cigarettes    Quit date: 01/05/2009  . Smokeless tobacco: Never Used  . Alcohol use No  . Drug use: No  . Sexual activity: No   Other Topics Concern  . Not on file   Social History Narrative   Lives alone; has one brother.  Both of them live in poverty.  Brother does all her shopping and takes her to doctors apts.  Otherwise patient is confined to her home.      Patient does not drink caffeine.   Patient is right handed.   Family History  Problem Relation Age of Onset  . Stroke Mother   . Dementia Father   . Prostate cancer Father   . Congestive Heart Failure Brother   . Diabetes Brother   . Hypertension Brother       VITAL SIGNS BP 120/60   Pulse 88  Temp 97.8 F (36.6 C)   Resp 20   Ht 5\' 2"  (1.575 m)   Wt (!) 346 lb (156.9 kg)   SpO2 96%   BMI 63.28 kg/m   Patient's Medications  New Prescriptions   No medications on file  Previous Medications   ALBUTEROL (PROVENTIL HFA;VENTOLIN HFA) 108 (90 BASE) MCG/ACT INHALER    Inhale 2 puffs into the lungs every 4 (four) hours as needed for wheezing or shortness of breath.   ARIPIPRAZOLE (ABILIFY) 2 MG TABLET    Take 1 tablet (2 mg total) by mouth every other day.   ASCORBIC ACID (VITAMIN C) 1000 MG TABLET    Take 1,000 mg by mouth 3 (three) times daily.    ATORVASTATIN (LIPITOR) 10 MG TABLET    Take 1 tablet (10 mg total) by mouth at bedtime.   CETIRIZINE (ZYRTEC) 10 MG TABLET    Take 10 mg by mouth daily.   CYCLOBENZAPRINE (FLEXERIL) 10 MG TABLET    Take 1 tablet (10 mg total) by mouth 3 (three) times daily as needed for muscle spasms.   FLUTICASONE FUROATE-VILANTEROL (BREO ELLIPTA) 100-25 MCG/INH AEPB    Inhale 1 puff into the lungs daily.    FUROSEMIDE (LASIX) 20 MG TABLET    Give 2  tablets by mouth one time daily   NYSTATIN (MYCOSTATIN/NYSTOP) POWDER    APPLY TO UNDER BREAST AND ABDOMINAL FOLDS TWICE DAILY   OMEGA-3 FATTY ACIDS (FISH OIL) 1000 MG CAPS    Take 2 capsules (2,000 mg total) by mouth at bedtime.   OXYCODONE-ACETAMINOPHEN (PERCOCET) 10-325 MG TABLET    Take 1 tablet by mouth every 6 (six) hours as needed for pain.   OXYGEN    Inhale 4 L into the lungs continuous. 4l/min    POLYETHYLENE GLYCOL (MIRALAX / GLYCOLAX) PACKET    Take 17 g by mouth daily as needed for mild constipation.    POTASSIUM CHLORIDE SA (K-DUR,KLOR-CON) 20 MEQ TABLET    Take 2 tablets once a day.   PREGABALIN (LYRICA) 150 MG CAPSULE    Take 1 capsule (150 mg total) by mouth 2 (two) times daily.   PSYLLIUM (EQ DAILY FIBER PO)    Take 2 capsules by mouth daily.    TRAZODONE (DESYREL) 150 MG TABLET    take 2 tablets by mouth at bedtime  Modified Medications   No medications on file  Discontinued Medications   EMOLLIENT (EUCERIN) LOTION    Apply topically as needed for dry skin. To back of leg   GLIPIZIDE (GLUCOTROL) 5 MG TABLET    take 1 tablet by mouth once daily   MECLIZINE (ANTIVERT) 25 MG TABLET    Take 25 mg by mouth every 6 (six) hours as needed for dizziness.   TORSEMIDE (DEMADEX) 20 MG TABLET    Take 2 tablets twice a day.   VENLAFAXINE XR (EFFEXOR-XR) 150 MG 24 HR CAPSULE    Take 2 capsules (300 mg total) by mouth daily.     SIGNIFICANT DIAGNOSTIC EXAMS  None recent   LABS REVIEWED:   None recent   Review of Systems  Constitutional: Negative for malaise/fatigue.  Respiratory: Negative for cough and shortness of breath.   Cardiovascular: Negative for chest pain, palpitations and leg swelling.  Gastrointestinal: Negative for abdominal pain, constipation and heartburn.  Musculoskeletal: Positive for back pain and myalgias. Negative for joint pain.       Has chronic back and leg pain   Skin: Negative.   Neurological: Negative for  dizziness.  Psychiatric/Behavioral: The  patient is nervous/anxious.    Physical Exam  Constitutional: She is oriented to person, place, and time. No distress.  Morbidly obese   Eyes: Conjunctivae are normal.  Neck: Neck supple. No JVD present. No thyromegaly present.  Cardiovascular: Normal rate, regular rhythm, normal heart sounds and intact distal pulses.   Respiratory: Effort normal. No respiratory distress. She has no wheezes.  Breath sounds diminished 02 4L/[place] dependent   GI: Soft. Bowel sounds are normal. She exhibits no distension. There is no tenderness.  Musculoskeletal: She exhibits edema.  Able to move all extremities Has limited movement due to her obesity  Has bilateral lower extremity lymphedema   Lymphadenopathy:    She has no cervical adenopathy.  Neurological: She is alert and oriented to person, place, and time.  Skin: Skin is warm and dry. She is not diaphoretic.  Bilateral lower legs discolored Has dry skin on bilateral lower legs   Psychiatric: She has a normal mood and affect.     ASSESSMENT/ PLAN:  1. Chronic diastolic heart failure: EF 60-65% (08-20-16): will change her to Demadex 40 mg twice daily with k+ 40 meq twice daily   2. Dyslipidemia: will continue lipitor 10 mg daily fish oil 2 gm daily   3. Hypertension: b/p 120/60: is currently not on medications will monitor  4. COPD with chronic respiratory failure with hypoxia: is 02 dependent at 4L/[place]; will continue breo ellipta 100/25 mcg 1 puff daily; albuterol 2 puffs every 4 hours as needed; is taking zyrtec 10 mg daily for allergies  5. Constipation: will continue fibercon 2 tabs daily and miralax daily as needed  6. Peripheral neuropathy: will continue lyrica 150 mg twice daily  7. Chronic pain: with fibromyalgia:  will continue lyrica 150 mg twice daily and has percocet 10/325 mg every 6 hours as needed will monitor   8. Recurrent major depression: has history of panic attacks: will continue abilify 2 mg every other day will continue  trazodone 300 mg nightly   9. Morbid obesity: BMI 63.28 does have diet orange crush at bed side she tells me that is the only thing that she withstand.   10. Diabetes: is currently not on medications will monitor   Will check cbc; cmp; lipids; tsh hgb a1c vit d     MD is aware of resident's narcotic use and is in agreement with current plan of care. We will attempt to wean resident as apropriate   Ok Edwards NP [facility]  Contact [phone] Monday through Friday 8am- 5pm  After hours call [phone]

## 2017-04-15 NOTE — Telephone Encounter (Signed)
Pt called again today. She has had no lasix or turseomide today. Her feet look like they are going to burst. She has had not Effexor today.  She is a basket case. Please callher back at [phone]

## 2017-04-16 ENCOUNTER — Non-Acute Institutional Stay (SKILLED_NURSING_FACILITY): Payer: Medicare Other | Admitting: Internal Medicine

## 2017-04-16 ENCOUNTER — Encounter: Payer: Self-pay | Admitting: Internal Medicine

## 2017-04-16 DIAGNOSIS — I872 Venous insufficiency (chronic) (peripheral): Secondary | ICD-10-CM

## 2017-04-16 DIAGNOSIS — I5032 Chronic diastolic (congestive) heart failure: Secondary | ICD-10-CM | POA: Diagnosis not present

## 2017-04-16 DIAGNOSIS — M25561 Pain in right knee: Secondary | ICD-10-CM

## 2017-04-16 DIAGNOSIS — I10 Essential (primary) hypertension: Secondary | ICD-10-CM

## 2017-04-16 DIAGNOSIS — M25562 Pain in left knee: Secondary | ICD-10-CM

## 2017-04-16 DIAGNOSIS — G4733 Obstructive sleep apnea (adult) (pediatric): Secondary | ICD-10-CM

## 2017-04-16 DIAGNOSIS — E1142 Type 2 diabetes mellitus with diabetic polyneuropathy: Secondary | ICD-10-CM

## 2017-04-16 DIAGNOSIS — G8929 Other chronic pain: Secondary | ICD-10-CM

## 2017-04-16 DIAGNOSIS — F331 Major depressive disorder, recurrent, moderate: Secondary | ICD-10-CM

## 2017-04-16 NOTE — Progress Notes (Signed)
Provider:  Virgie Dad, MD Location:  [place] Room Number: [place] of Service:  SNF [phone])  PCP: Evette Doffing, MD Patient Care Team: Sela Hua, MD as PCP - General (Family Medicine) Francis Gaines, [name] as [place], NP as [facility] [facility]  Extended Emergency Contact Information Primary Emergency Contact: [place] of [place] Phone: [phone] Mobile Phone: [phone] Relation: Brother  Code Status: DNR Goals of Care: Advanced Directive information Advanced Directives 04/16/2017  Does Patient Have a Medical Advance Directive? Yes  Type of Advance Directive Out of facility DNR (pink MOST or yellow form)  Does patient want to make changes to medical advance directive? No - Patient declined  Copy of [place] in Chart? -  Would patient like information on creating a medical advance directive? -  Pre-existing out of facility DNR order (yellow form or pink MOST form) Pink MOST form placed in chart (order not valid for inpatient use)      Chief Complaint  Patient presents with  . New Admit To SNF    Admission    HPI: Patient is a 56 y.o. female seen today for admission to SNF for Possible Long term Placement. Patient has h/o Morbid obesity, Cellulitis of LE, Diastolic CHF with EF of 27%, Depression,  OSA doesn't use BIPAP, COPD on Home oxygen, Prediabetic, Hyperlipidemia. Patient lives with her Brother and is dependent on him for her for her ADLs. He was diagnosed with rectal cancer and is undergoing surgery today and would be unable to take care of her. So she is in SNF for therapy and possible Long term care. Patient is doing well. She says she wants to make sure she gets her Oxycodone on time as she has Arthritis and it hurts everywhere. Also denies any SOB or cough. She also wants to be restarted on her Effexor.  She  continues to be depressed and Anxious due to her brother fighting with cancer.  At baseline patient uses her Lift chair mostly for transfers. Uses bedside commode and is mainly home bound  Past Medical History:  Diagnosis Date  . Arthritis    "I feel like it's everywhere" (11/08/2014)  . Cellulitis and abscess of lower extremity 08/16/2016   right leg  . Chronic lower back pain   . COPD (chronic obstructive pulmonary disease) ([place])   . Depression   . Diabetes mellitus without complication ([place])    type 2   . Fibromyalgia   . Headache    "at least weekly; it's usually when I first wake up" (11/08/2014)  . Hyperlipidemia   . Hypertension   . HYPERTENSION, BENIGN SYSTEMIC 02/12/2007  . Memory disturbance   . Migraines 1970's - <2000   "they just went away"  . Neuropathy   . Normal echocardiogram 05/30/05   suboptimal study  . Obesity, morbid (more than 100 lbs over ideal weight or BMI > 40) (HCC)    obese since childhood  . Osteoarthritis   . Peripheral edema   . Pressure ulcer of foot    left  . Sepsis ([place])   . Shortness of breath dyspnea   . Sleep apnea    "recently dx'd; haven't got my equipment yet" (11/08/2014)   Past Surgical History:  Procedure Laterality Date  . ABDOMINAL HERNIA REPAIR  2002  . APPENDECTOMY  1995  . CHOLECYSTECTOMY  1995  . TOTAL ABDOMINAL HYSTERECTOMY  1994    reports that she quit smoking about 8 years ago. Her smoking use included Cigarettes. She has a 30.00 pack-year smoking history. She has never used smokeless tobacco. She reports that she does not drink alcohol or use drugs. Social History   Social History  . Marital status: Single    Spouse name: N/A  . Number of children: 0  . Years of education: college-1   Occupational History  . Disabled     severe depression and morbid obesity   Social History Main Topics  . Smoking status: Former Smoker    Packs/day: 1.00    Years: 30.00    Types: Cigarettes    Quit date: 01/05/2009  .  Smokeless tobacco: Never Used  . Alcohol use No  . Drug use: No  . Sexual activity: No   Other Topics Concern  . Not on file   Social History Narrative   Lives alone; has one brother.  Both of them live in poverty.  Brother does all her shopping and takes her to doctors apts.  Otherwise patient is confined to her home.      Patient does not drink caffeine.   Patient is right handed.    Functional Status Survey:    Family History  Problem Relation Age of Onset  . Stroke Mother   . Dementia Father   . Prostate cancer Father   . Congestive Heart Failure Brother   . Diabetes Brother   . Hypertension Brother     Health Maintenance  Topic Date Due  . TETANUS/TDAP  08/23/2017 (Originally 06/15/2012)  . MAMMOGRAM  04/15/2018 (Originally 05/18/2012)  . HEMOGLOBIN A1C  04/15/2018 (Originally 12/04/2016)  . URINE MICROALBUMIN  04/15/2018 (Originally 05/18/1972)  . COLONOSCOPY  04/15/2018 (Originally 05/18/2012)  . FOOT EXAM  06/04/2017  . INFLUENZA VACCINE  07/16/2017  . OPHTHALMOLOGY EXAM  03/04/2018  . PNEUMOCOCCAL POLYSACCHARIDE VACCINE (2) 01/16/2021  . Hepatitis C Screening  Completed  . HIV Screening  Completed    Allergies  Allergen Reactions  . Aspirin Hives    REACTION: hives Hasn't had in years     Outpatient Encounter Prescriptions as of 04/16/2017  Medication Sig  . albuterol (PROVENTIL HFA;VENTOLIN HFA) 108 (90 Base) MCG/ACT inhaler Inhale 2 puffs into the lungs every 6 (six) hours as needed for wheezing or shortness of breath.  . ARIPiprazole (ABILIFY) 2 MG tablet Take 1 tablet (2 mg total) by mouth every other day.  . Ascorbic Acid (VITAMIN C) 1000 MG tablet Take 1,000 mg by mouth 3 (three) times daily.   Marland Kitchen atorvastatin (LIPITOR) 10 MG tablet Take 1 tablet (10 mg total) by mouth at bedtime.  . cetirizine (ZYRTEC) 10 MG tablet Take 10 mg by mouth daily.  . cyclobenzaprine (FLEXERIL) 10 MG tablet Take 1 tablet (10 mg total) by mouth 3 (three) times daily as needed for  muscle spasms.  . fluticasone furoate-vilanterol (BREO ELLIPTA) 100-25 MCG/INH AEPB Inhale 1 puff into the lungs daily.   Marland Kitchen nystatin (MYCOSTATIN/NYSTOP) powder APPLY TO UNDER BREAST AND ABDOMINAL FOLDS TWICE DAILY  . Omega-3 Fatty Acids (FISH OIL) 1000 MG CAPS Take 2 capsules (2,000 mg total) by mouth at bedtime.  Marland Kitchen oxyCODONE-acetaminophen (PERCOCET) 10-325 MG tablet Take 1 tablet by mouth every 6 (six) hours as needed for pain.  . OXYGEN Inhale 4 L into the lungs continuous. 4l/min   . polyethylene glycol (MIRALAX / GLYCOLAX) packet Take 17 g by mouth daily as needed for mild constipation.   Marland Kitchen  potassium chloride SA (K-DUR,KLOR-CON) 20 MEQ tablet Take 2 tablets once a day.  . pregabalin (LYRICA) 150 MG capsule Take 1 capsule (150 mg total) by mouth 2 (two) times daily.  . Psyllium (EQ DAILY FIBER PO) Take 2 capsules by mouth daily.   Marland Kitchen torsemide (DEMADEX) 20 MG tablet Take 40 mg by mouth 2 (two) times daily.  . traZODone (DESYREL) 150 MG tablet take 2 tablets by mouth at bedtime  . [DISCONTINUED] albuterol (PROVENTIL HFA;VENTOLIN HFA) 108 (90 Base) MCG/ACT inhaler Inhale 2 puffs into the lungs every 4 (four) hours as needed for wheezing or shortness of breath. (Patient not taking: Reported on 04/16/2017)  . [DISCONTINUED] furosemide (LASIX) 20 MG tablet Give 2 tablets by mouth one time daily   No facility-administered encounter medications on file as of 04/16/2017.     Review of Systems  Constitutional: Negative for activity change.  HENT: Negative.   Respiratory: Negative.   Cardiovascular: Negative.   Gastrointestinal: Negative.   Genitourinary: Negative.   Musculoskeletal: Positive for arthralgias, back pain, gait problem, joint swelling and myalgias.  Skin: Negative.   Neurological: Positive for weakness. Negative for dizziness, light-headedness and numbness.  Psychiatric/Behavioral: Positive for decreased concentration and dysphoric mood. The patient is nervous/anxious.     Vitals:    04/16/17 1153  BP: 128/80  Pulse: 96  Resp: (!) 24  Temp: 98.7 F (37.1 C)  SpO2: 92%   There is no height or weight on file to calculate BMI. Physical Exam  Constitutional: She is oriented to person, place, and time. She appears well-developed and well-nourished.  HENT:  Head: Normocephalic.  Mouth/Throat: Oropharynx is clear and moist.  Eyes: Pupils are equal, round, and reactive to light.  Neck: Neck supple.  Cardiovascular: Normal rate, regular rhythm and normal heart sounds.   No murmur heard. Pulmonary/Chest: Effort normal and breath sounds normal. No respiratory distress. She has no wheezes. She has no rales.  Abdominal: Soft. Bowel sounds are normal. She exhibits no distension. There is no tenderness. There is no rebound.  Musculoskeletal:  Edema Bilateral extremities with Chronic Venous changes  Lymphadenopathy:    She has no cervical adenopathy.  Neurological: She is alert and oriented to person, place, and time.  Cannot raise Left arm up due to Shoulder problems.  Strength 2/5 in b/l LE    Labs reviewed: Basic Metabolic Panel:  Recent Labs  11/26/16 1454 11/27/16 0521 12/14/16 0725 12/14/16 0734  NA 142 141 144 142  K 3.8 3.6 4.2 3.9  CL 93* 93* 92* 89*  CO2 39* 39* 44*  --   GLUCOSE 108* 117* 117* 115*  BUN 13 12 11 13   CREATININE 0.99 0.83 0.83 1.10*  CALCIUM 9.5 9.0 9.8  --    Liver Function Tests:  Recent Labs  11/24/16 2203 11/26/16 1454 12/14/16 0725  AST 21 33 12*  ALT 13* 25 10*  ALKPHOS 81 73 71  BILITOT 0.9 0.7 0.8  PROT 7.9 7.2 6.9  ALBUMIN 4.0 3.2* 3.1*    Recent Labs  12/14/16 0725  LIPASE 12   No results for input(s): AMMONIA in the last 8760 hours. CBC:  Recent Labs  11/24/16 2203 11/26/16 1454 11/27/16 0521 11/27/16 1522 12/14/16 0725 12/14/16 0734  WBC 11.7* 9.4 7.4 6.2 4.5  --   NEUTROABS 10.5* 8.2*  --   --  3.3  --   HGB 10.3* 9.4* 8.0* 8.3* 9.1* 9.9*  HCT 34.0* 31.5* 27.4* 28.5* 32.2* 29.0*  MCV 93.9  96.0  96.5 96.6 100.0  --   PLT 228 191 153 159 148*  --    Cardiac Enzymes:  Recent Labs  10/14/16 1942 10/15/16 0329 10/16/16 1451  TROPONINI <0.03 <0.03 <0.03   BNP: Invalid input(s): POCBNP Lab Results  Component Value Date   HGBA1C 6.3 06/04/2016   Lab Results  Component Value Date   TSH 0.439 11/26/2016   Lab Results  Component Value Date   VITAMINB12 400 03/17/2014   No results found for: FOLATE No results found for: IRON, TIBC, FERRITIN  Imaging and Procedures obtained prior to SNF admission: No results found.  Assessment/Plan  Chronic diastolic congestive heart failure  Patient is doing well now that she is on Demadex They are weighing her in the facility but it is going to be challenge due to her Obesity. Follow up labs are pending.  Essential hypertension BP Controlled  OSA (obstructive sleep apnea) Patient Does not use her BiPAP. She is on Chronic oxygen therapy.  Type 2 diabetes mellitus Patient says she stopped checking her BS as her sugars run less then 100.  Her repeat A1C is pending Will do couple of accu checks in facility.  Venous stasis dermatitis of both lower extremities Continue Supportive care  Chronic pain of both knees Will Continue Oxycodone Prn and Lyrica   Recurrent major depressive disorder  Patient is restarted on Effexor. She is on Abilify Also get Trazadone at night for insomnia.  Disposition Patient needs to be in Assisted facility. Would be difficult to discharge home.    Family/ staff Communication:   Labs/tests ordered: Total time spent in this patient care encounter was 45_ minutes; greater than 50% of the visit spent counseling patient and coordinating care for problems addressed at this encounter.

## 2017-04-16 NOTE — Telephone Encounter (Signed)
Called patient last night. We discussed that the [facility] doctor is in charge of her medications. I will call over there today to see if we can fax over an updated medication list.

## 2017-04-17 ENCOUNTER — Other Ambulatory Visit: Payer: Self-pay

## 2017-04-17 LAB — BASIC METABOLIC PANEL
BUN: 12 mg/dL (ref 4–21)
Creatinine: 0.7 mg/dL (ref 0.5–1.1)
GLUCOSE: 99 mg/dL
Potassium: 4.2 mmol/L (ref 3.4–5.3)
SODIUM: 143 mmol/L (ref 137–147)

## 2017-04-17 LAB — CBC AND DIFFERENTIAL
HCT: 35 % — AB (ref 36–46)
Hemoglobin: 11.5 g/dL — AB (ref 12.0–16.0)
NEUTROS ABS: 4 /uL
Platelets: 234 10*3/uL (ref 150–399)
WBC: 5.6 10^3/mL

## 2017-04-17 LAB — HEPATIC FUNCTION PANEL
ALT: 8 U/L (ref 7–35)
AST: 11 U/L — AB (ref 13–35)
Alkaline Phosphatase: 92 U/L (ref 25–125)
Bilirubin, Total: 0.4 mg/dL

## 2017-04-17 MED ORDER — OXYCODONE-ACETAMINOPHEN 10-325 MG PO TABS: 1.0000 | ORAL_TABLET | Freq: Every day | ORAL | 0 refills | Status: DC

## 2017-04-17 NOTE — Telephone Encounter (Signed)
RX faxed to [facility] fax # [phone] Phone #[phone] 

## 2017-04-22 ENCOUNTER — Other Ambulatory Visit: Payer: Self-pay | Admitting: *Deleted

## 2017-04-22 NOTE — Patient Outreach (Signed)
[facility]) Care Management  04/22/2017  ADREANA COULL Aug 22, 1961 [id number]   CSW was able to meet with patient today at [facility], [place] where patient currently resides to receive short-term rehabilitative services.  Patient appeared to be somewhat anxious and expressed feelings of depression, worrying about her brother, Clemence Lengyel, who recently underwent surgery.  Mr. Reever is also residing with patient at [facility], but CSW was unable to visit with Mr. Stepanian today because he was experiencing a great deal of pain and did not wish to have visitors.  CSW explained to Mrs. Siglin that [place], Geriatric Nurse Practitioner, also with [facility], are both planning to meet with patient and Mr. Amparo on Thursday, May 10th at 1:00pm to discuss discharge planning arrangements for both of them. Patient admitted that she is somewhat anxious because she was informed today that she is scheduled to be discharged home next week.  Patient is extremely fearful of returning home without Mr. Figeroa, as she is unable to care for herself independently.  Mr. Cafaro currently provides 24 hour care and supervision for patient and assists patient with all activities of daily living.  Mr. Cavey will require several more weeks of therapies (both physical and occupational), needing to reside at [facility] at least until June.  Even after being released from [facility], Mr. Siebel will be on various restrictions, such as no heavy lifting, pushing or pulling. CSW and Ms. Spinks plan to discuss long-term care placement options with patient, as returning home is not a safe discharge plan for her or Mr. Leys.  Patient will be very reluctant, but realizes that she is unable to adequately care for herself.  CSW will request an updated FL-2 Form on patient and fax to all long-term care assisted living facilities within a 50-mile radius.  CSW and  Ms. Spinks will try and enlist the help of Mr. Collinsworth to convince patient that long-term care placement is the best option for her at this time. [name], [name], [name], [name]  Licensed Education officer, environmental Health System  Mailing [place] [address] [place], [place] [address] Physical Address-[address], [place], [place] [address] Toll Free Main # [phone] Fax # [phone] Cell # [phone]  Office # [phone] [email or web address]

## 2017-04-23 ENCOUNTER — Ambulatory Visit: Payer: Self-pay | Admitting: *Deleted

## 2017-04-25 ENCOUNTER — Non-Acute Institutional Stay (SKILLED_NURSING_FACILITY): Payer: Medicare Other | Admitting: Adult Health

## 2017-04-25 ENCOUNTER — Encounter: Payer: Self-pay | Admitting: *Deleted

## 2017-04-25 ENCOUNTER — Encounter: Payer: Self-pay | Admitting: Adult Health

## 2017-04-25 ENCOUNTER — Other Ambulatory Visit: Payer: Self-pay | Admitting: *Deleted

## 2017-04-25 DIAGNOSIS — L03115 Cellulitis of right lower limb: Secondary | ICD-10-CM

## 2017-04-25 NOTE — Progress Notes (Signed)
Location:   [place] Room Number: [place] of Service:  SNF (31)   CODE STATUS: DNR  Allergies  Allergen Reactions  . Aspirin Hives    REACTION: hives Hasn't had in years     Chief Complaint  Patient presents with  . Acute Visit    Cellulitis of Right Leg    HPI:  Her right lower leg is red hot and inflamed. She does have tenderness present to touch. There are no reports of fever present. She tells me that her leg is bothering her.    Past Medical History:  Diagnosis Date  . Arthritis    "I feel like it's everywhere" (11/08/2014)  . Cellulitis and abscess of lower extremity 08/16/2016   right leg  . Chronic lower back pain   . COPD (chronic obstructive pulmonary disease) ([place])   . Depression   . Diabetes mellitus without complication ([place])    type 2   . Fibromyalgia   . Headache    "at least weekly; it's usually when I first wake up" (11/08/2014)  . Hyperlipidemia   . Hypertension   . HYPERTENSION, BENIGN SYSTEMIC 02/12/2007  . Memory disturbance   . Migraines 1970's - <2000   "they just went away"  . Neuropathy   . Normal echocardiogram 05/30/05   suboptimal study  . Obesity, morbid (more than 100 lbs over ideal weight or BMI > 40) (HCC)    obese since childhood  . Osteoarthritis   . Peripheral edema   . Pressure ulcer of foot    left  . Sepsis ([place])   . Shortness of breath dyspnea   . Sleep apnea    "recently dx'd; haven't got my equipment yet" (11/08/2014)    Past Surgical History:  Procedure Laterality Date  . ABDOMINAL HERNIA REPAIR  2002  . APPENDECTOMY  1995  . CHOLECYSTECTOMY  1995  . TOTAL ABDOMINAL HYSTERECTOMY  1994    Social History   Social History  . Marital status: Single    Spouse name: N/A  . Number of children: 0  . Years of education: college-1   Occupational History  . Disabled     severe depression and morbid obesity   Social History Main Topics  . Smoking status: Former Smoker    Packs/day: 1.00    Years: 30.00    Types: Cigarettes    Quit date: 01/05/2009  . Smokeless tobacco: Never Used  . Alcohol use No  . Drug use: No  . Sexual activity: No   Other Topics Concern  . Not on file   Social History Narrative   Lives alone; has one brother.  Both of them live in poverty.  Brother does all her shopping and takes her to doctors apts.  Otherwise patient is confined to her home.      Patient does not drink caffeine.   Patient is right handed.   Family History  Problem Relation Age of Onset  . Stroke Mother   . Dementia Father   . Prostate cancer Father   . Congestive Heart Failure Brother   . Diabetes Brother   . Hypertension Brother       VITAL SIGNS BP 136/72   Pulse 85   Temp 98.1 F (36.7 C)   Resp (!) 22   Ht 5\' 2"  (1.575 m)   Wt (!) 346 lb (156.9 kg)   SpO2 95%   BMI 63.28 kg/m   Patient's Medications  New Prescriptions   No  medications on file  Previous Medications   ALBUTEROL (PROVENTIL HFA;VENTOLIN HFA) 108 (90 BASE) MCG/ACT INHALER    Inhale 2 puffs into the lungs every 6 (six) hours as needed for wheezing or shortness of breath.   ARIPIPRAZOLE (ABILIFY) 2 MG TABLET    Take 1 tablet (2 mg total) by mouth every other day.   ASCORBIC ACID (VITAMIN C) 1000 MG TABLET    Take 1,000 mg by mouth 3 (three) times daily.    ATORVASTATIN (LIPITOR) 10 MG TABLET    Take 1 tablet (10 mg total) by mouth at bedtime.   CETIRIZINE (ZYRTEC) 10 MG TABLET    Take 10 mg by mouth daily.   CYCLOBENZAPRINE (FLEXERIL) 10 MG TABLET    Take 1 tablet (10 mg total) by mouth 3 (three) times daily as needed for muscle spasms.   FLUTICASONE FUROATE-VILANTEROL (BREO ELLIPTA) 100-25 MCG/INH AEPB    Inhale 1 puff into the lungs daily.    NYSTATIN (MYCOSTATIN/NYSTOP) POWDER    APPLY TO UNDER BREAST AND ABDOMINAL FOLDS TWICE DAILY   OMEGA-3 FATTY ACIDS (FISH OIL) 1000 MG CAPS    Take 2 capsules (2,000 mg total) by mouth at bedtime.   OXYCODONE-ACETAMINOPHEN (PERCOCET) 10-325 MG TABLET     Take 1 tablet by mouth at bedtime.   OXYGEN    Inhale 4 L into the lungs continuous. 4l/min    POLYETHYLENE GLYCOL (MIRALAX / GLYCOLAX) PACKET    Take 17 g by mouth daily as needed for mild constipation.    POTASSIUM CHLORIDE SA (K-DUR,KLOR-CON) 20 MEQ TABLET    Take 2 tablets once a day.   PREGABALIN (LYRICA) 150 MG CAPSULE    Take 1 capsule (150 mg total) by mouth 2 (two) times daily.   PSYLLIUM (EQ DAILY FIBER PO)    Take 2 capsules by mouth daily.    TORSEMIDE (DEMADEX) 20 MG TABLET    Take 40 mg by mouth 2 (two) times daily.   TRAZODONE (DESYREL) 150 MG TABLET    take 2 tablets by mouth at bedtime   VENLAFAXINE XR (EFFEXOR-XR) 150 MG 24 HR CAPSULE    Give 2 capsules by mouth one time daily  Modified Medications   No medications on file  Discontinued Medications   No medications on file     SIGNIFICANT DIAGNOSTIC EXAMS  None recent   LABS REVIEWED:   None recent   Review of Systems  Constitutional: Negative for malaise/fatigue.  Respiratory: Negative for cough and shortness of breath.   Cardiovascular: Negative for chest pain, palpitations and leg swelling.  Gastrointestinal: Negative for abdominal pain, constipation and heartburn.  Musculoskeletal: Positive for back pain and myalgias. Negative for joint pain.       Has chronic back and leg pain   Skin: Negative.   Neurological: Negative for dizziness.  Psychiatric/Behavioral: The patient has no complaints.   Physical Exam  Constitutional: She is oriented to person, place, and time. No distress.  Morbidly obese   Eyes: Conjunctivae are normal.  Neck: Neck supple. No JVD present. No thyromegaly present.  Cardiovascular: Normal rate, regular rhythm, normal heart sounds and intact distal pulses.   Respiratory: Effort normal. No respiratory distress. She has no wheezes.  Breath sounds diminished 02 4L/[place] dependent   GI: Soft. Bowel sounds are normal. She exhibits no distension. There is no tenderness.  Musculoskeletal:  She exhibits edema.  Able to move all extremities Has limited movement due to her obesity  Has bilateral lower extremity lymphedema  Lymphadenopathy:    She has no cervical adenopathy.  Neurological: She is alert and oriented to person, place, and time.  Skin: Skin is warm and dry. She is not diaphoretic.  Bilateral lower legs discolored Has dry skin on bilateral lower legs Right lower leg red hot inflamed    Psychiatric: She has a normal mood and affect.     ASSESSMENT/ PLAN:  1. Right lower leg cellulitis: will being doxycycline 100 mg twice daily for 2 weeks with florastor.   MD is aware of resident's narcotic use and is in agreement with current plan of care. We will attempt to wean resident as apropriate    Ok Edwards NP [facility]  Contact [phone] Monday through Friday 8am- 5pm  After hours call [phone]

## 2017-04-25 NOTE — Patient Outreach (Signed)
Community Care Manager signing off case due to pt LTC stay. [employer], [name], will monitor pt during her stay. Should pt be discharged to independent living, another community care manager will be assigned as current care manager is assuming a new role within the organization.  [name] [facility] [phone]

## 2017-05-01 ENCOUNTER — Other Ambulatory Visit: Payer: Self-pay | Admitting: *Deleted

## 2017-05-01 NOTE — Patient Outreach (Signed)
[facility]) Care Management  05/01/2017  NORALEE DUTKO August 28, 1961 [id number]  CSW was able to meet with patient and patient's brother, Ebany Bowermaster today at [facility], [place] where they both currently reside to receive rehabilitative services.  Patient appeared to be in great spirits today, reporting that she has lost even more weight and that she continues to work well with therapies (both physical and occupational).  CSW continues to motivate patient to become more and more independent, as she is aware that Mr. Brillhart is no longer able to care for himself independently, let alone provide 24 hour care and supervision to her. CSW was able to converse with [place], Veterinary surgeon at [facility] regarding long-term care placement arrangements for patient and Mr. Bors.  Anguilla indicated that she did not foresee it being a problem moving both patient and Mr. Ladouceur to a [place] [facility] Medicaid bed, as they currently have female and female beds available.  CSW explained to [place] that [facility] was able to assist Mr. Bockrath with completion of a Long-Term Care Medicaid application for patient, prior to patient being admitted to [facility].  It would just be a matter of switching Mr. Bou Adult Medicaid to [place] Medicaid, which CSW agreed to do by contacting Mr. Farinas Medicaid Case Worker at the [facility]. An[place]illa indicated that she plans to meet with the interdisciplinary team tomorrow morning, at which time, patient and Mr. Zapanta cases will be reviewed.  Anguilla agreed to obtain confirmation from the administrator at [facility] that patient and Mr. Clegg will be able to receive long-term care at their facility, then follow-up with CSW to report findings.  CSW explained to [place] that it is imperative that patient continue to receive therapies, as she is making great progress and we do not wish for  patient to decline.  Anguilla voiced understanding and was agreeable to this plan.  CSW will follow-up with patient and Mr. Shor in one week, while meeting with them at [facility] to perform a routine visit. [name], [name], [name], [name]  Licensed Education officer, environmental Health System  Mailing [place] [address] [place], [place] [address] Physical Address-[address], [place], [place] [address] Toll Free Main # [phone] Fax # [phone] Cell # [phone]  Office # [phone] [name]

## 2017-05-05 DIAGNOSIS — J449 Chronic obstructive pulmonary disease, unspecified: Secondary | ICD-10-CM | POA: Diagnosis not present

## 2017-05-06 ENCOUNTER — Other Ambulatory Visit: Payer: Self-pay | Admitting: Internal Medicine

## 2017-05-06 ENCOUNTER — Telehealth: Payer: Self-pay | Admitting: Internal Medicine

## 2017-05-06 DIAGNOSIS — G894 Chronic pain syndrome: Secondary | ICD-10-CM

## 2017-05-06 NOTE — Telephone Encounter (Signed)
Patient is aware that referral has been placed. Kathleen Roy,CMA

## 2017-05-06 NOTE — Telephone Encounter (Signed)
Referral placed.

## 2017-05-06 NOTE — Telephone Encounter (Signed)
Will forward to MD. [name],CMA  

## 2017-05-06 NOTE — Telephone Encounter (Signed)
Pt needs another referral sent to Dr. Mirna Mires since has been in so long. ep

## 2017-05-07 DIAGNOSIS — L03115 Cellulitis of right lower limb: Secondary | ICD-10-CM | POA: Insufficient documentation

## 2017-05-08 ENCOUNTER — Other Ambulatory Visit: Payer: Self-pay | Admitting: *Deleted

## 2017-05-08 DIAGNOSIS — J449 Chronic obstructive pulmonary disease, unspecified: Secondary | ICD-10-CM | POA: Diagnosis not present

## 2017-05-08 NOTE — Patient Outreach (Signed)
Called Terril at the SNF to advise I will be visiting her tomorrow around 3:00 pm.  [name] [facility] [phone]

## 2017-05-09 ENCOUNTER — Other Ambulatory Visit: Payer: Self-pay | Admitting: *Deleted

## 2017-05-09 DIAGNOSIS — J449 Chronic obstructive pulmonary disease, unspecified: Secondary | ICD-10-CM | POA: Diagnosis not present

## 2017-05-09 NOTE — Patient Outreach (Signed)
[facility]) Care Management  05/09/2017  SHINE SCROGHAM [birth year] [id number]   Patient reports continuing to work well with therapies (both physical and occupational) at [facility] [place] where patient currently resides to receive short-term rehabilitative services.  Patient reported that she will be moved to a [place] bed next week, at the same facility, under her [place] Medicaid benefit. The plan is for patient to remain at the skilled nursing facility until she is able to perform activities of daily living independently.  The goal is also for patient to lose some additional weight to be able to ambulate without assistance.  Patient admits to talking with her landlord, who is currently agreeable to holding their rent for an additional two months.  CSW will continue to follow patient while at [place] to assess and assist with social work needs and services.  Patient will meet with patient and patient's brother, Berdene Askari at [facility] on Friday, June 8th.  Patient appeared to be in great spirits today, very excited about receiving a visit from [name], Geriatric Nurse Practitioner with [facility]. [name], [name], [name], [name]  Licensed Education officer, environmental Health System  Mailing [place] [address] [place], [place] [address] Physical Address-[address], [place], [place] [address] Toll Free Main # [phone] Fax # [phone] Cell # [phone]  Office # [phone] [email or web address]

## 2017-05-09 NOTE — Patient Outreach (Signed)
Today, I went to [place] to visit Kathleen Roy and her brother Kathleen Roy.Both of their progress is quite slow. Today was evidently not a good day for the SNF staff as Kathleen Roy had not received any medication for the day by 2:30 pm nor any pain medication for 12 hours. She was in bed and quite weepy.   I was able to go find a nurse and request that she get her meds ASAP. I also advised the nurse that her humidifier on the oxygen concentrator was empty.  Kathleen Roy and I discussed her plan of care. I emphasized to her that she must be independent to go home. She no longer needs to rely on her brother for care as he is recovering from cancer removal surgery.  I also advised pt that if she does go home another care manager will be assigned and that it may be [facility] as she was assigned previous to my envolvement.  I advised I will stay in touch.  Kathleen Roy [facility] [place] [phone]

## 2017-05-12 DIAGNOSIS — J449 Chronic obstructive pulmonary disease, unspecified: Secondary | ICD-10-CM | POA: Diagnosis not present

## 2017-05-13 DIAGNOSIS — J449 Chronic obstructive pulmonary disease, unspecified: Secondary | ICD-10-CM | POA: Diagnosis not present

## 2017-05-14 DIAGNOSIS — J449 Chronic obstructive pulmonary disease, unspecified: Secondary | ICD-10-CM | POA: Diagnosis not present

## 2017-05-15 DIAGNOSIS — J449 Chronic obstructive pulmonary disease, unspecified: Secondary | ICD-10-CM | POA: Diagnosis not present

## 2017-05-16 ENCOUNTER — Non-Acute Institutional Stay (SKILLED_NURSING_FACILITY): Payer: Medicare Other | Admitting: Adult Health

## 2017-05-16 ENCOUNTER — Encounter: Payer: Self-pay | Admitting: Adult Health

## 2017-05-16 DIAGNOSIS — I5032 Chronic diastolic (congestive) heart failure: Secondary | ICD-10-CM

## 2017-05-16 DIAGNOSIS — F331 Major depressive disorder, recurrent, moderate: Secondary | ICD-10-CM | POA: Diagnosis not present

## 2017-05-16 DIAGNOSIS — J449 Chronic obstructive pulmonary disease, unspecified: Secondary | ICD-10-CM

## 2017-05-16 DIAGNOSIS — G6289 Other specified polyneuropathies: Secondary | ICD-10-CM | POA: Diagnosis not present

## 2017-05-16 DIAGNOSIS — J9611 Chronic respiratory failure with hypoxia: Secondary | ICD-10-CM | POA: Diagnosis not present

## 2017-05-16 DIAGNOSIS — G8929 Other chronic pain: Secondary | ICD-10-CM | POA: Diagnosis not present

## 2017-05-16 DIAGNOSIS — M5440 Lumbago with sciatica, unspecified side: Secondary | ICD-10-CM

## 2017-05-16 DIAGNOSIS — I10 Essential (primary) hypertension: Secondary | ICD-10-CM

## 2017-05-16 NOTE — Progress Notes (Signed)
Location:   [facility] Room Number: 195K Place of Service:  [facility]   CODE STATUS: DNR/MOST  Allergies  Allergen Reactions  . Aspirin Hives    REACTION: hives Hasn't had in years     Chief Complaint  Patient presents with  . Medical Management of Chronic Issues    Routine visit    HPI:  She is a long term resident of this facility being seen for the management of her chronic illnesses. Overall her status is stable. She is now getting out of bed to wheelchair daily. She tells met that her pain is being managed and has no complaints. There are no nursing concerns at this time.    Past Medical History:  Diagnosis Date  . Arthritis    "I feel like it's everywhere" (11/08/2014)  . Cellulitis and abscess of lower extremity 08/16/2016   right leg  . Chronic lower back pain   . COPD (chronic obstructive pulmonary disease) ([place])   . Depression   . Diabetes mellitus without complication ([place])    type 2   . Fibromyalgia   . Headache    "at least weekly; it's usually when I first wake up" (11/08/2014)  . Hyperlipidemia   . Hypertension   . HYPERTENSION, BENIGN SYSTEMIC 02/12/2007  . Memory disturbance   . Migraines 1970's - <2000   "they just went away"  . Neuropathy   . Normal echocardiogram 05/30/05   suboptimal study  . Obesity, morbid (more than 100 lbs over ideal weight or BMI > 40) (HCC)    obese since childhood  . Osteoarthritis   . Peripheral edema   . Pressure ulcer of foot    left  . Sepsis ([place])   . Shortness of breath dyspnea   . Sleep apnea    "recently dx'd; haven't got my equipment yet" (11/08/2014)    Past Surgical History:  Procedure Laterality Date  . ABDOMINAL HERNIA REPAIR  2002  . APPENDECTOMY  1995  . CHOLECYSTECTOMY  1995  . TOTAL ABDOMINAL HYSTERECTOMY  1994    Social History   Social History  . Marital status: Single    Spouse name: N/A  . Number of children: 0  . Years of education: college-1   Occupational History   . Disabled     severe depression and morbid obesity   Social History Main Topics  . Smoking status: Former Smoker    Packs/day: 1.00    Years: 30.00    Types: Cigarettes    Quit date: 01/05/2009  . Smokeless tobacco: Never Used  . Alcohol use No  . Drug use: No  . Sexual activity: No   Other Topics Concern  . Not on file   Social History Narrative   Lives alone; has one brother.  Both of them live in poverty.  Brother does all her shopping and takes her to doctors apts.  Otherwise patient is confined to her home.      Patient does not drink caffeine.   Patient is right handed.   Family History  Problem Relation Age of Onset  . Stroke Mother   . Dementia Father   . Prostate cancer Father   . Congestive Heart Failure Brother   . Diabetes Brother   . Hypertension Brother       VITAL SIGNS BP 132/74   Pulse 78   Temp 98.2 F (36.8 C)   Resp 18   Ht '5\' 2"'  (1.575 m)   Wt (!) 323 lb  4.8 oz (146.6 kg)   SpO2 98% Comment: 4LPM O2  BMI 59.13 kg/m   Patient's Medications  New Prescriptions   No medications on file  Previous Medications   ALBUTEROL (PROVENTIL HFA;VENTOLIN HFA) 108 (90 BASE) MCG/ACT INHALER    Inhale 2 puffs into the lungs every 6 (six) hours as needed for wheezing or shortness of breath.   ARIPIPRAZOLE (ABILIFY) 2 MG TABLET    Take 1 tablet (2 mg total) by mouth every other day.   ASCORBIC ACID (VITAMIN C) 1000 MG TABLET    Take 1,000 mg by mouth 3 (three) times daily.    ATORVASTATIN (LIPITOR) 10 MG TABLET    Take 1 tablet (10 mg total) by mouth at bedtime.   CETIRIZINE (ZYRTEC) 10 MG TABLET    Take 10 mg by mouth daily.   CYCLOBENZAPRINE (FLEXERIL) 10 MG TABLET    Take 1 tablet (10 mg total) by mouth 3 (three) times daily as needed for muscle spasms.   FLUTICASONE FUROATE-VILANTEROL (BREO ELLIPTA) 100-25 MCG/INH AEPB    Inhale 1 puff into the lungs daily.    NYSTATIN (MYCOSTATIN/NYSTOP) POWDER    APPLY TO UNDER BREAST AND ABDOMINAL FOLDS TWICE DAILY     OMEGA-3 FATTY ACIDS (FISH OIL) 1000 MG CAPS    Take 2 capsules (2,000 mg total) by mouth at bedtime.   OXYCODONE-ACETAMINOPHEN (PERCOCET) 10-325 MG TABLET    Take 1 tablet by mouth every 6 (six) hours as needed for pain.   OXYGEN    Inhale 4 L into the lungs continuous. 4l/min    POLYETHYLENE GLYCOL (MIRALAX / GLYCOLAX) PACKET    Take 17 g by mouth daily.    POTASSIUM CHLORIDE SA (K-DUR,KLOR-CON) 20 MEQ TABLET    Take 2 tablets once a day.   PREGABALIN (LYRICA) 150 MG CAPSULE    Take 1 capsule (150 mg total) by mouth 2 (two) times daily.   PSYLLIUM (EQ DAILY FIBER PO)    Take 2 capsules by mouth daily.    TORSEMIDE (DEMADEX) 20 MG TABLET    Take 40 mg by mouth 2 (two) times daily.   TRAZODONE (DESYREL) 150 MG TABLET    take 2 tablets by mouth at bedtime   VENLAFAXINE XR (EFFEXOR-XR) 150 MG 24 HR CAPSULE    Give 2 capsules by mouth one time daily  Modified Medications   No medications on file  Discontinued Medications   OXYCODONE-ACETAMINOPHEN (PERCOCET) 10-325 MG TABLET    Take 1 tablet by mouth at bedtime.     SIGNIFICANT DIAGNOSTIC EXAMS   None recent   LABS REVIEWED:   04-17-17:wbc 5.6; hgb 11.5; hct 35.0; mcv 92.7; plt 234; glucose 99; bun 12.2; creat 0.66; k+ 4.2; na++ 143; ca 9.8; liver normal albumin 4.0 tsh 0.94; hgb a1c 4.8 chol 116; ldl 50; trig 85; hdl 49; vit D 12.39    Review of Systems  Constitutional: Negative for malaise/fatigue.  Respiratory: Negative for cough and shortness of breath.   Cardiovascular: Negative for chest pain, palpitations and leg swelling.  Gastrointestinal: Negative for abdominal pain, constipation and heartburn.  Musculoskeletal: Positive for back pain and myalgias. Negative for joint pain.       Has chronic back and leg pain is well managed    Skin: Negative.   Neurological: Negative for dizziness.  Psychiatric/Behavioral: The patient has no complaints.    Physical Exam  Constitutional: She is oriented to person, place, and time. No  distress.  Morbidly obese   Eyes: Conjunctivae are  normal.  Neck: Neck supple. No JVD present. No thyromegaly present.  Cardiovascular: Normal rate, regular rhythm, normal heart sounds and intact distal pulses.   Respiratory: Effort normal. No respiratory distress. She has no wheezes.  Breath sounds diminished 02 4L/[place] dependent   GI: Soft. Bowel sounds are normal. She exhibits no distension. There is no tenderness.  Musculoskeletal: She exhibits edema.  Able to move all extremities Has limited movement due to her obesity  Has bilateral lower extremity lymphedema   Lymphadenopathy:    She has no cervical adenopathy.  Neurological: She is alert and oriented to person, place, and time.  Skin: Skin is warm and dry. She is not diaphoretic.  Bilateral lower legs discolored Has dry skin on bilateral lower legs    Psychiatric: She has a normal mood and affect.    ASSESSMENT/ PLAN:   1. Chronic diastolic heart failure: EF 60-65% (08-20-16): will change her to Demadex 40 mg twice daily with k+ 40 meq twice daily   2. Dyslipidemia: ldl 50 trig 85  will continue lipitor 10 mg daily fish oil 2 gm daily   3. Hypertension: b/p 132/74: is currently not on medications will monitor  4. COPD with chronic respiratory failure with hypoxia: is 02 dependent at 4L/[place]; will continue breo ellipta 100/25 mcg 1 puff daily; albuterol 2 puffs every 4 hours as needed; is taking zyrtec 10 mg daily for allergies  5. Constipation: will continue fibercon 2 tabs daily and miralax daily as needed  6. Peripheral neuropathy:no complaints of pain at this time.  will continue lyrica 150 mg twice daily  7. Chronic pain: with fibromyalgia:  will continue lyrica 150 mg twice daily and has percocet 10/325 mg every 6 hours as needed will monitor   8. Recurrent major depression: has history of panic attacks: will continue  effexor xr 300 mg daily abilify 2 mg every other day will continue trazodone 300 mg nightly   9.  Morbid obesity: BMI 59.13 (previous 63.28)  does have diet orange crush at bed side   10. Diabetes: hgb a1c 4.8  is currently not on medications will monitor    MD is aware of resident's narcotic use and is in agreement with current plan of care. We will attempt to wean resident as apropriate   Ok Edwards NP [facility]  Contact [phone] Monday through Friday 8am- 5pm  After hours call [phone]

## 2017-05-19 DIAGNOSIS — J449 Chronic obstructive pulmonary disease, unspecified: Secondary | ICD-10-CM | POA: Diagnosis not present

## 2017-05-20 DIAGNOSIS — J449 Chronic obstructive pulmonary disease, unspecified: Secondary | ICD-10-CM | POA: Diagnosis not present

## 2017-05-21 DIAGNOSIS — J449 Chronic obstructive pulmonary disease, unspecified: Secondary | ICD-10-CM | POA: Diagnosis not present

## 2017-05-22 ENCOUNTER — Non-Acute Institutional Stay (SKILLED_NURSING_FACILITY): Payer: Medicare Other | Admitting: Internal Medicine

## 2017-05-22 ENCOUNTER — Encounter: Payer: Self-pay | Admitting: Internal Medicine

## 2017-05-22 DIAGNOSIS — M25562 Pain in left knee: Secondary | ICD-10-CM | POA: Diagnosis not present

## 2017-05-22 DIAGNOSIS — G8929 Other chronic pain: Secondary | ICD-10-CM | POA: Diagnosis not present

## 2017-05-22 DIAGNOSIS — G894 Chronic pain syndrome: Secondary | ICD-10-CM

## 2017-05-22 DIAGNOSIS — M25561 Pain in right knee: Secondary | ICD-10-CM | POA: Diagnosis not present

## 2017-05-22 DIAGNOSIS — G6289 Other specified polyneuropathies: Secondary | ICD-10-CM

## 2017-05-22 DIAGNOSIS — J449 Chronic obstructive pulmonary disease, unspecified: Secondary | ICD-10-CM | POA: Diagnosis not present

## 2017-05-22 NOTE — Progress Notes (Signed)
Patient ID: Kathleen Roy, female   DOB: 03/23/1961, 56 y.o.   MRN: [id number]    DATE:  05/22/2017  Location:    [facility] Room Number: [place] of Service: SNF (31)   Extended Emergency Contact Information Primary Emergency Contact: Bree,Don  [place] Phone: [phone] Mobile Phone: [phone] Relation: Brother  Advanced Directive information Does Patient Have a Medical Advance Directive?: Yes, Type of Advance Directive: Out of facility DNR (pink MOST or yellow form), Pre-existing out of facility DNR order (yellow form or pink MOST form): Pink MOST form placed in chart (order not valid for inpatient use), Does patient want to make changes to medical advance directive?: No - Patient declined  Chief Complaint  Patient presents with  . Pain    Generalized pain    HPI:  56 yo female seen today for uncontrolled pain. She reports pain 10/10 on scale especially in her knees. She takes 2-3 percocet daily but could take 4 per day. Pain med only brings pain down to 8/10 on scale. She has not seen PCP in some time due to transportation issues. She would like to see pain specialist. She has taken percocet x 1 yr  Past Medical History:  Diagnosis Date  . Arthritis    "I feel like it's everywhere" (11/08/2014)  . Cellulitis and abscess of lower extremity 08/16/2016   right leg  . Chronic lower back pain   . COPD (chronic obstructive pulmonary disease) ([place])   . Depression   . Diabetes mellitus without complication ([place])    type 2   . Fibromyalgia   . Headache    "at least weekly; it's usually when I first wake up" (11/08/2014)  . Hyperlipidemia   . Hypertension   . HYPERTENSION, BENIGN SYSTEMIC 02/12/2007  . Memory disturbance   . Migraines 1970's - <2000   "they just went away"  . Neuropathy   . Normal echocardiogram 05/30/05   suboptimal study  . Obesity, morbid (more than 100 lbs over ideal weight or BMI > 40) (HCC)    obese since childhood   . Osteoarthritis   . Peripheral edema   . Pressure ulcer of foot    left  . Sepsis ([place])   . Shortness of breath dyspnea   . Sleep apnea    "recently dx'd; haven't got my equipment yet" (11/08/2014)    Past Surgical History:  Procedure Laterality Date  . ABDOMINAL HERNIA REPAIR  2002  . APPENDECTOMY  1995  . CHOLECYSTECTOMY  1995  . TOTAL ABDOMINAL HYSTERECTOMY  1994    Patient Care Team: Mayo, Pete Pelt, MD as PCP - General (Family Medicine) Saporito, Maree Erie, [name] as [place] Management  Social History   Social History  . Marital status: Single    Spouse name: N/A  . Number of children: 0  . Years of education: college-1   Occupational History  . Disabled     severe depression and morbid obesity   Social History Main Topics  . Smoking status: Former Smoker    Packs/day: 1.00    Years: 30.00    Types: Cigarettes    Quit date: 01/05/2009  . Smokeless tobacco: Never Used  . Alcohol use No  . Drug use: No  . Sexual activity: No   Other Topics Concern  . Not on file   Social History Narrative   Lives alone; has one brother.  Both of them live in poverty.  Brother  does all her shopping and takes her to doctors apts.  Otherwise patient is confined to her home.      Patient does not drink caffeine.   Patient is right handed.     reports that she quit smoking about 8 years ago. Her smoking use included Cigarettes. She has a 30.00 pack-year smoking history. She has never used smokeless tobacco. She reports that she does not drink alcohol or use drugs.  Family History  Problem Relation Age of Onset  . Stroke Mother   . Dementia Father   . Prostate cancer Father   . Congestive Heart Failure Brother   . Diabetes Brother   . Hypertension Brother    Family Status  Relation Status  . Mother Deceased at age 29  . Father Deceased at age 72       Lewy body dementia  . Brother Alive  . Sister Alive    Immunization History  Administered  Date(s) Administered  . Influenza,inj,Quad PF,36+ Mos 08/22/2015  . PPD Test 04/12/2017, 04/18/2017  . Pneumococcal Polysaccharide-23 01/17/2016  . Td 06/15/2002    Allergies  Allergen Reactions  . Aspirin Hives    REACTION: hives Hasn't had in years     Medications: Patient's Medications  New Prescriptions   No medications on file  Previous Medications   ALBUTEROL (PROVENTIL HFA;VENTOLIN HFA) 108 (90 BASE) MCG/ACT INHALER    Inhale 2 puffs into the lungs every 6 (six) hours as needed for wheezing or shortness of breath.   ARIPIPRAZOLE (ABILIFY) 2 MG TABLET    Take 1 tablet (2 mg total) by mouth every other day.   ASCORBIC ACID (VITAMIN C) 1000 MG TABLET    Take 1,000 mg by mouth 3 (three) times daily.    ATORVASTATIN (LIPITOR) 10 MG TABLET    Take 1 tablet (10 mg total) by mouth at bedtime.   CETIRIZINE (ZYRTEC) 10 MG TABLET    Take 10 mg by mouth daily.   CYCLOBENZAPRINE (FLEXERIL) 10 MG TABLET    Take 1 tablet (10 mg total) by mouth 3 (three) times daily as needed for muscle spasms.   FLUTICASONE FUROATE-VILANTEROL (BREO ELLIPTA) 100-25 MCG/INH AEPB    Inhale 1 puff into the lungs daily.    NYSTATIN (MYCOSTATIN/NYSTOP) POWDER    APPLY TO UNDER BREAST AND ABDOMINAL FOLDS TWICE DAILY   OMEGA-3 FATTY ACIDS (FISH OIL) 1000 MG CAPS    Take 2 capsules (2,000 mg total) by mouth at bedtime.   OXYCODONE-ACETAMINOPHEN (PERCOCET) 10-325 MG TABLET    Take 1 tablet by mouth every 6 (six) hours as needed for pain.   OXYGEN    Inhale 4 L into the lungs continuous. 4l/min    POLYETHYLENE GLYCOL (MIRALAX / GLYCOLAX) PACKET    Take 17 g by mouth daily.    POTASSIUM CHLORIDE SA (K-DUR,KLOR-CON) 20 MEQ TABLET    Take 2 tablets once a day.   PREGABALIN (LYRICA) 150 MG CAPSULE    Take 1 capsule (150 mg total) by mouth 2 (two) times daily.   PSYLLIUM (EQ DAILY FIBER PO)    Take 2 capsules by mouth daily.    TORSEMIDE (DEMADEX) 20 MG TABLET    Take 40 mg by mouth 2 (two) times daily.   TRAZODONE  (DESYREL) 150 MG TABLET    take 2 tablets by mouth at bedtime   VENLAFAXINE XR (EFFEXOR-XR) 150 MG 24 HR CAPSULE    Give 2 capsules by mouth one time daily  Modified Medications  No medications on file  Discontinued Medications   No medications on file    Review of Systems  Musculoskeletal: Positive for arthralgias, back pain, gait problem and joint swelling.  All other systems reviewed and are negative.   Vitals:   05/22/17 1259  BP: 132/74  Pulse: 78  Resp: 18  Temp: 98.2 F (36.8 C)  TempSrc: Oral  SpO2: 98%  Weight: (!) 323 lb 4.8 oz (146.6 kg)  Height: 5\' 2"  (1.575 m)   Body mass index is 59.13 kg/m.  Physical Exam  Constitutional: She appears well-developed and well-nourished.  Lying in bed in NAD, [place] O2 intact  HENT:  Mouth/Throat: Oropharynx is clear and moist. No oropharyngeal exudate.  MMM; no oral thrush  Eyes: Pupils are equal, round, and reactive to light. No scleral icterus.  Neck: Neck supple. Carotid bruit is not present. No tracheal deviation present.  Cardiovascular: Normal rate, regular rhythm and intact distal pulses.  Exam reveals no gallop and no friction rub.   Murmur (1/6 SEM) heard. Chronic venous stasis changes b/l LE with crusting noted medially. No secondary signs of infection. No weeping. +1 pitting LE edema b/l. No calf TTP  Pulmonary/Chest: Effort normal and breath sounds normal. No stridor. No respiratory distress. She has no wheezes. She has no rales.  Abdominal: Soft. Bowel sounds are normal. She exhibits no distension and no mass. There is no hepatomegaly. There is no tenderness. There is no rebound and no guarding.  obese  Musculoskeletal: She exhibits edema (b/l knee) and tenderness.  Lymphadenopathy:    She has no cervical adenopathy.  Neurological: She is alert.  Skin: Skin is warm and dry. No rash noted.  Psychiatric: She has a normal mood and affect. Her behavior is normal. Thought content normal.     Labs  reviewed: Abstract on 04/22/2017  Component Date Value Ref Range Status  . Glucose 04/17/2017 99  mg/dL Final  . BUN 04/17/2017 12  4 - 21 mg/dL Final  . Creatinine 04/17/2017 0.7  0.5 - 1.1 mg/dL Final  . Potassium 04/17/2017 4.2  3.4 - 5.3 mmol/L Final  . Sodium 04/17/2017 143  137 - 147 mmol/L Final  . Alkaline Phosphatase 04/17/2017 92  25 - 125 U/L Final  . ALT 04/17/2017 8  7 - 35 U/L Final  . AST 04/17/2017 11* 13 - 35 U/L Final  . Bilirubin, Total 04/17/2017 0.4  mg/dL Final  . Hemoglobin 04/17/2017 11.5* 12.0 - 16.0 g/dL Final  . HCT 04/17/2017 35* 36 - 46 % Final  . Neutrophils Absolute 04/17/2017 4  /L Final  . Platelets 04/17/2017 234  150 - 399 K/L Final  . WBC 04/17/2017 5.6  10^3/mL Final    No results found.   Assessment/Plan   ICD-10-CM   1. Chronic pain syndrome G89.4   2. Other polyneuropathy G62.89   3. Chronic pain of both knees M25.561    M25.562    G89.29     Consult pain management  Start Opana er 7.5mg  q12hr. If tolerates, will titrate dose accordingly.  Will d/c percocet once opana er at effective dose  Cont other meds as ordered  Cont [place] O2 as ordered  Will follow  Tully Mcinturff S. Perlie Gold  [facility] and Adult Medicine [address] [place], [place] [address] [phone] Cell (Monday-Friday 8 AM - 5 PM) [phone] After 5 PM and follow prompts

## 2017-05-23 ENCOUNTER — Other Ambulatory Visit: Payer: Self-pay | Admitting: *Deleted

## 2017-05-23 DIAGNOSIS — J449 Chronic obstructive pulmonary disease, unspecified: Secondary | ICD-10-CM | POA: Diagnosis not present

## 2017-05-23 NOTE — Patient Outreach (Signed)
[place]) Care Management  05/23/2017  Kathleen Roy 1961-03-23 [id number]   CSW was able to meet with patient today at [facility] [place] where patient currently resides to receive short-term rehabilitative services, and then possibly long-term care services. Patient appeared to be upset today, preoccupied with thoughts of her brother, [name], also residing at [facility], being discharged home from the facility within the next few days.  Patient does not believe her brother is ready for discharge and does not wish to be "left at the facility without him". CSW reminded patient that she needs to continue to get stronger so that she can perform activities of daily living independently, in the event that she is able to return home to live with Mr. Wren.  Patient voiced understanding. CSW offered counseling and supportive services, where appropriate. CSW agreed to follow-up with patient in two weeks to assess and assist with discharge planning needs and services. [name], [name], [name], [name]  Licensed Education officer, environmental Health System  Mailing [place] [address] [place], [place] [address] Physical Address-[address], [place], [place] [address] Toll Free Main # [phone] Fax # [phone] Cell # [phone]  Office # [phone] [email or web address]

## 2017-05-26 DIAGNOSIS — J449 Chronic obstructive pulmonary disease, unspecified: Secondary | ICD-10-CM | POA: Diagnosis not present

## 2017-05-26 DIAGNOSIS — J9611 Chronic respiratory failure with hypoxia: Secondary | ICD-10-CM | POA: Diagnosis not present

## 2017-05-27 DIAGNOSIS — J449 Chronic obstructive pulmonary disease, unspecified: Secondary | ICD-10-CM | POA: Diagnosis not present

## 2017-05-28 DIAGNOSIS — J449 Chronic obstructive pulmonary disease, unspecified: Secondary | ICD-10-CM | POA: Diagnosis not present

## 2017-05-29 DIAGNOSIS — J449 Chronic obstructive pulmonary disease, unspecified: Secondary | ICD-10-CM | POA: Diagnosis not present

## 2017-05-30 DIAGNOSIS — J449 Chronic obstructive pulmonary disease, unspecified: Secondary | ICD-10-CM | POA: Diagnosis not present

## 2017-06-02 ENCOUNTER — Other Ambulatory Visit: Payer: Self-pay | Admitting: *Deleted

## 2017-06-02 DIAGNOSIS — J449 Chronic obstructive pulmonary disease, unspecified: Secondary | ICD-10-CM | POA: Diagnosis not present

## 2017-06-02 MED ORDER — OXYCODONE-ACETAMINOPHEN 10-325 MG PO TABS: ORAL_TABLET | ORAL | 0 refills | Status: DC

## 2017-06-02 NOTE — Telephone Encounter (Signed)
[facility] #[phone] Fax:[phone]  

## 2017-06-03 DIAGNOSIS — J449 Chronic obstructive pulmonary disease, unspecified: Secondary | ICD-10-CM | POA: Diagnosis not present

## 2017-06-04 DIAGNOSIS — J449 Chronic obstructive pulmonary disease, unspecified: Secondary | ICD-10-CM | POA: Diagnosis not present

## 2017-06-05 DIAGNOSIS — J449 Chronic obstructive pulmonary disease, unspecified: Secondary | ICD-10-CM | POA: Diagnosis not present

## 2017-06-06 ENCOUNTER — Other Ambulatory Visit: Payer: Self-pay | Admitting: *Deleted

## 2017-06-06 ENCOUNTER — Encounter: Payer: Self-pay | Admitting: *Deleted

## 2017-06-06 DIAGNOSIS — J449 Chronic obstructive pulmonary disease, unspecified: Secondary | ICD-10-CM | POA: Diagnosis not present

## 2017-06-06 NOTE — Patient Outreach (Signed)
[place]) Care Management  06/06/2017  DAUNA ZISKA 12/17/1960 [id number]  CSW was able to meet with patient and patient's brother, Marieke Lubke today at [facility] [place] where they currently reside to receive short-term rehabilitative services.  Both patient and Mr. Footman won their appeals with [employer] and were approved for Long-Term Care Medicaid coverage through the [place]. Patient and Mr. Viviano plan to remain at [facility], for at least an additional three months. Patient and Mr. Weldin would like to get stronger, more independent and be able to perform activities of daily living independently.   CSW will perform a case closure on patient, as all goals of treatment have been met from social work standpoint and no additional social work needs have been identified at this time.  CSW will fax an update to patient's Primary Care Physician, Dr. Hyman Bible to ensure that they are aware of CSW's involvement with patient's plan of care.  CSW will submit a case closure request to Verlon Setting, Care Management Assistant with [facility], in the form of an [employer].   [name], [name], [name], [name]  Licensed Education officer, environmental Health System  Mailing [place] [address], [place], [place] [address] Physical Address-[address], [place], [place] [address] Toll Free Main # [phone] Fax # [phone] Cell # [phone]  Office # [phone] [name]

## 2017-06-09 DIAGNOSIS — J449 Chronic obstructive pulmonary disease, unspecified: Secondary | ICD-10-CM | POA: Diagnosis not present

## 2017-06-10 DIAGNOSIS — J449 Chronic obstructive pulmonary disease, unspecified: Secondary | ICD-10-CM | POA: Diagnosis not present

## 2017-06-11 DIAGNOSIS — J449 Chronic obstructive pulmonary disease, unspecified: Secondary | ICD-10-CM | POA: Diagnosis not present

## 2017-06-12 DIAGNOSIS — J449 Chronic obstructive pulmonary disease, unspecified: Secondary | ICD-10-CM | POA: Diagnosis not present

## 2017-06-13 ENCOUNTER — Other Ambulatory Visit: Payer: Self-pay | Admitting: *Deleted

## 2017-06-13 DIAGNOSIS — J449 Chronic obstructive pulmonary disease, unspecified: Secondary | ICD-10-CM | POA: Diagnosis not present

## 2017-06-13 MED ORDER — OXYCODONE-ACETAMINOPHEN 10-325 MG PO TABS: ORAL_TABLET | ORAL | 0 refills | Status: DC

## 2017-06-13 NOTE — Telephone Encounter (Signed)
[facility] #[phone] Fax:[phone]  

## 2017-06-16 ENCOUNTER — Encounter: Payer: Self-pay | Admitting: Adult Health

## 2017-06-16 ENCOUNTER — Non-Acute Institutional Stay (SKILLED_NURSING_FACILITY): Payer: Medicare Other | Admitting: Adult Health

## 2017-06-16 DIAGNOSIS — G6289 Other specified polyneuropathies: Secondary | ICD-10-CM | POA: Diagnosis not present

## 2017-06-16 DIAGNOSIS — M797 Fibromyalgia: Secondary | ICD-10-CM

## 2017-06-16 DIAGNOSIS — J9611 Chronic respiratory failure with hypoxia: Secondary | ICD-10-CM | POA: Diagnosis not present

## 2017-06-16 DIAGNOSIS — I10 Essential (primary) hypertension: Secondary | ICD-10-CM

## 2017-06-16 DIAGNOSIS — F331 Major depressive disorder, recurrent, moderate: Secondary | ICD-10-CM

## 2017-06-16 DIAGNOSIS — J449 Chronic obstructive pulmonary disease, unspecified: Secondary | ICD-10-CM

## 2017-06-16 DIAGNOSIS — I5032 Chronic diastolic (congestive) heart failure: Secondary | ICD-10-CM | POA: Diagnosis not present

## 2017-06-16 NOTE — Progress Notes (Signed)
Location:   [facility] Room Number: [place] of Service:  SNF (31)   CODE STATUS: DNR  Allergies  Allergen Reactions  . Aspirin Hives    REACTION: hives Hasn't had in years     Chief Complaint  Patient presents with  . Medical Management of Chronic Issues    1 month follow up    HPI:  She is a 56 year old long term resident of this facility being seen for the management of her chronic illnesses. She is not voicing any complaints. She states overall that she feels pretty good; and does enjoy bingo. There are no nursing concerns at this time.    Past Medical History:  Diagnosis Date  . Arthritis    "I feel like it's everywhere" (11/08/2014)  . Cellulitis and abscess of lower extremity 08/16/2016   right leg  . Chronic lower back pain   . COPD (chronic obstructive pulmonary disease) ([place])   . Depression   . Diabetes mellitus without complication ([place])    type 2   . Fibromyalgia   . Headache    "at least weekly; it's usually when I first wake up" (11/08/2014)  . Hyperlipidemia   . Hypertension   . HYPERTENSION, BENIGN SYSTEMIC 02/12/2007  . Memory disturbance   . Migraines 1970's - <2000   "they just went away"  . Neuropathy   . Normal echocardiogram 05/30/05   suboptimal study  . Obesity, morbid (more than 100 lbs over ideal weight or BMI > 40) (HCC)    obese since childhood  . Osteoarthritis   . Peripheral edema   . Pressure ulcer of foot    left  . Sepsis ([place])   . Shortness of breath dyspnea   . Sleep apnea    "recently dx'd; haven't got my equipment yet" (11/08/2014)    Past Surgical History:  Procedure Laterality Date  . ABDOMINAL HERNIA REPAIR  2002  . APPENDECTOMY  1995  . CHOLECYSTECTOMY  1995  . TOTAL ABDOMINAL HYSTERECTOMY  1994    Social History   Social History  . Marital status: Single    Spouse name: N/A  . Number of children: 0  . Years of education: college-1   Occupational History  . Disabled     severe  depression and morbid obesity   Social History Main Topics  . Smoking status: Former Smoker    Packs/day: 1.00    Years: 30.00    Types: Cigarettes    Quit date: 01/05/2009  . Smokeless tobacco: Never Used  . Alcohol use No  . Drug use: No  . Sexual activity: No   Other Topics Concern  . Not on file   Social History Narrative   Lives alone; has one brother.  Both of them live in poverty.  Brother does all her shopping and takes her to doctors apts.  Otherwise patient is confined to her home.      Patient does not drink caffeine.   Patient is right handed.   Family History  Problem Relation Age of Onset  . Stroke Mother   . Dementia Father   . Prostate cancer Father   . Congestive Heart Failure Brother   . Diabetes Brother   . Hypertension Brother       VITAL SIGNS BP 113/61   Pulse 82   Temp 98.4 F (36.9 C)   Resp 20   Ht 5\' 2"  (1.575 m)   Wt (!) 330 lb 1.6 oz (149.7  kg)   SpO2 96%   BMI 60.38 kg/m   Patient's Medications  New Prescriptions   No medications on file  Previous Medications   ALBUTEROL (PROVENTIL HFA;VENTOLIN HFA) 108 (90 BASE) MCG/ACT INHALER    Inhale 2 puffs into the lungs every 6 (six) hours as needed for wheezing or shortness of breath.   ARIPIPRAZOLE (ABILIFY) 2 MG TABLET    Take 1 tablet (2 mg total) by mouth every other day.   ASCORBIC ACID (VITAMIN C) 1000 MG TABLET    Take 1,000 mg by mouth 3 (three) times daily.    ATORVASTATIN (LIPITOR) 10 MG TABLET    Take 1 tablet (10 mg total) by mouth at bedtime.   CETIRIZINE (ZYRTEC) 10 MG TABLET    Take 10 mg by mouth daily.   CYCLOBENZAPRINE (FLEXERIL) 10 MG TABLET    Take 1 tablet (10 mg total) by mouth 3 (three) times daily as needed for muscle spasms.   FLUTICASONE FUROATE-VILANTEROL (BREO ELLIPTA) 100-25 MCG/INH AEPB    Inhale 1 puff into the lungs daily.    OMEGA-3 FATTY ACIDS (FISH OIL) 1000 MG CAPS    Take 2 capsules (2,000 mg total) by mouth at bedtime.   OXYCODONE-ACETAMINOPHEN  (PERCOCET) 10-325 MG TABLET    Take one tablet by mouth every 6 hours as needed for pain   OXYGEN    Inhale 4 L into the lungs continuous. 4l/min    OXYMORPHONE (OPANA ER) 7.5 MG TB12 12 HR TABLET    Take 7.5 mg by mouth every 12 (twelve) hours.   POLYETHYLENE GLYCOL (MIRALAX / GLYCOLAX) PACKET    Take 17 g by mouth daily.    POTASSIUM CHLORIDE SA (K-DUR,KLOR-CON) 20 MEQ TABLET    Take 2 tablets once a day.   PREGABALIN (LYRICA) 150 MG CAPSULE    Take 1 capsule (150 mg total) by mouth 2 (two) times daily.   PSYLLIUM (EQ DAILY FIBER PO)    Take 2 capsules by mouth daily.    TORSEMIDE (DEMADEX) 20 MG TABLET    Take 40 mg by mouth 2 (two) times daily.   TRAZODONE (DESYREL) 150 MG TABLET    Take 2 tablets by mouth at bedtime   VENLAFAXINE XR (EFFEXOR-XR) 150 MG 24 HR CAPSULE    Give 2 capsules by mouth one time daily  Modified Medications   No medications on file  Discontinued Medications   NYSTATIN (MYCOSTATIN/NYSTOP) POWDER    APPLY TO UNDER BREAST AND ABDOMINAL FOLDS TWICE DAILY   TRAZODONE (DESYREL) 150 MG TABLET    take 2 tablets by mouth at bedtime     SIGNIFICANT DIAGNOSTIC EXAMS  None recent   LABS REVIEWED:   04-17-17:wbc 5.6; hgb 11.5; hct 35.0; mcv 92.7; plt 234; glucose 99; bun 12.2; creat 0.66; k+ 4.2; na++ 143; ca 9.8; liver normal albumin 4.0 tsh 0.94; hgb a1c 4.8 chol 116; ldl 50; trig 85; hdl 49; vit D 12.39    Review of Systems  Constitutional: Negative for malaise/fatigue.  Respiratory: Negative for cough and shortness of breath.   Cardiovascular: Negative for chest pain, palpitations and leg swelling.  Gastrointestinal: Negative for abdominal pain, constipation and heartburn.  Musculoskeletal: Positive for back pain and myalgias. Negative for joint pain.       Has chronic back and leg pain is well managed    Skin: Negative.   Neurological: Negative for dizziness.  Psychiatric/Behavioral: The patient has no complaints.    Physical Exam  Constitutional: She is  oriented  to person, place, and time. No distress.  Morbidly obese   Eyes: Conjunctivae are normal.  Neck: Neck supple. No JVD present. No thyromegaly present.  Cardiovascular: Normal rate, regular rhythm, normal heart sounds and intact distal pulses.   Respiratory: Effort normal. No respiratory distress. She has no wheezes.  Breath sounds diminished 02 4L/[place] dependent   GI: Soft. Bowel sounds are normal. She exhibits no distension. There is no tenderness.  Musculoskeletal: She exhibits edema.  Able to move all extremities Has limited movement due to her obesity  Has bilateral lower extremity lymphedema   Lymphadenopathy:    She has no cervical adenopathy.  Neurological: She is alert and oriented to person, place, and time.  Skin: Skin is warm and dry. She is not diaphoretic.  Bilateral lower legs discolored Has dry skin on bilateral lower legs    Psychiatric: She has a normal mood and affect.    ASSESSMENT/ PLAN:   1. Chronic diastolic heart failure: EF 60-65% (08-20-16): will continue  Demadex 40 mg twice daily with k+ 40 meq twice daily   2. Dyslipidemia: ldl 50 trig 85  will continue lipitor 10 mg daily fish oil 2 gm daily   3. Hypertension: b/p 113/61: is currently not on medications will monitor  4. COPD with chronic respiratory failure with hypoxia: is 02 dependent at 4L/[place]; will continue breo ellipta 100/25 mcg 1 puff daily; albuterol 2 puffs every 4 hours as needed; is taking zyrtec 10 mg daily for allergies  5. Constipation: will continue fibercon 2 tabs daily and miralax daily as needed  6. Peripheral neuropathy:no complaints of pain at this time.  will continue lyrica 150 mg twice daily  7. Chronic pain: with fibromyalgia:  will continue lyrica 150 mg twice daily oxymorphone 7.5 mg twice daily and has percocet 10/325 mg every 6 hours as needed will monitor   8. Recurrent major depression: has history of panic attacks: will continue  effexor xr 300 mg daily abilify 2 mg  every other day will continue trazodone 300 mg nightly   9. Morbid obesity: BMI 60.38  (previous 59.13)  does have diet orange crush at bed side   10. Diabetes: hgb a1c 4.8  is currently not on medications will monitor    MD is aware of resident's narcotic use and is in agreement with current plan of care. We will attempt to wean resident as apropriate    Ok Edwards NP [facility]  Contact [phone] Monday through Friday 8am- 5pm  After hours call [phone]

## 2017-06-19 ENCOUNTER — Other Ambulatory Visit: Payer: Self-pay | Admitting: *Deleted

## 2017-06-19 MED ORDER — OXYMORPHONE HCL ER 7.5 MG PO TB12: 7.5000 mg | ORAL_TABLET | Freq: Two times a day (BID) | ORAL | 0 refills | Status: DC

## 2017-06-23 ENCOUNTER — Telehealth: Payer: Self-pay | Admitting: Internal Medicine

## 2017-06-23 NOTE — Telephone Encounter (Signed)
patient calling to request refill of:  Name of Medication(s):  Nystatin Last date of OV:  06/04/2016  Pharmacy:  [facility]  Will forward to MD to advise.  [name],CMA

## 2017-06-23 NOTE — Telephone Encounter (Signed)
Pt needs refills on Nystatin. jw

## 2017-06-24 ENCOUNTER — Other Ambulatory Visit: Payer: Self-pay | Admitting: Internal Medicine

## 2017-06-24 MED ORDER — NYSTATIN POWD: 0 refills | Status: DC

## 2017-06-24 NOTE — Telephone Encounter (Signed)
Refill of Nystatin sent to pharmacy.

## 2017-06-25 ENCOUNTER — Other Ambulatory Visit: Payer: Self-pay | Admitting: Internal Medicine

## 2017-06-25 DIAGNOSIS — J9611 Chronic respiratory failure with hypoxia: Secondary | ICD-10-CM | POA: Diagnosis not present

## 2017-06-25 MED ORDER — NYSTATIN POWD: 3 refills | Status: DC

## 2017-07-15 ENCOUNTER — Other Ambulatory Visit: Payer: Self-pay

## 2017-07-15 MED ORDER — PREGABALIN 150 MG PO CAPS: 150.0000 mg | ORAL_CAPSULE | Freq: Two times a day (BID) | ORAL | 0 refills | Status: DC

## 2017-07-15 MED ORDER — OXYMORPHONE HCL ER 7.5 MG PO TB12: 7.5000 mg | ORAL_TABLET | Freq: Two times a day (BID) | ORAL | 0 refills | Status: DC

## 2017-07-17 ENCOUNTER — Non-Acute Institutional Stay (SKILLED_NURSING_FACILITY): Payer: Medicare Other | Admitting: Internal Medicine

## 2017-07-17 ENCOUNTER — Encounter: Payer: Self-pay | Admitting: Internal Medicine

## 2017-07-17 DIAGNOSIS — I10 Essential (primary) hypertension: Secondary | ICD-10-CM | POA: Diagnosis not present

## 2017-07-17 DIAGNOSIS — J449 Chronic obstructive pulmonary disease, unspecified: Secondary | ICD-10-CM | POA: Diagnosis not present

## 2017-07-17 DIAGNOSIS — G894 Chronic pain syndrome: Secondary | ICD-10-CM | POA: Diagnosis not present

## 2017-07-17 DIAGNOSIS — I872 Venous insufficiency (chronic) (peripheral): Secondary | ICD-10-CM | POA: Diagnosis not present

## 2017-07-17 DIAGNOSIS — I5032 Chronic diastolic (congestive) heart failure: Secondary | ICD-10-CM | POA: Diagnosis not present

## 2017-07-17 DIAGNOSIS — J9611 Chronic respiratory failure with hypoxia: Secondary | ICD-10-CM

## 2017-07-17 DIAGNOSIS — M797 Fibromyalgia: Secondary | ICD-10-CM | POA: Diagnosis not present

## 2017-07-17 DIAGNOSIS — G6289 Other specified polyneuropathies: Secondary | ICD-10-CM

## 2017-07-17 DIAGNOSIS — F331 Major depressive disorder, recurrent, moderate: Secondary | ICD-10-CM

## 2017-07-17 NOTE — Progress Notes (Signed)
Patient ID: Kathleen Roy, female   DOB: 1961/04/05, 56 y.o.   MRN: [id number]     DATE:  July 17, 2017  Location:   [facility] Room Number: [place] of Service: SNF (31)   Extended Emergency Contact Information Primary Emergency Contact: Hirano,Don  [place] Phone: [phone] Mobile Phone: [phone] Relation: Brother  Advanced Directive information Does Patient Have a Medical Advance Directive?: Yes, Type of Advance Directive: Out of facility DNR (pink MOST or yellow form), Pre-existing out of facility DNR order (yellow form or pink MOST form): Pink MOST form placed in chart (order not valid for inpatient use), Does patient want to make changes to medical advance directive?: No - Patient declined  Chief Complaint  Patient presents with  . Medical Management of Chronic Issues    1 month follow up    HPI:  56 yo female long term resident seen today for f/u. She states she would like to go home. Family participated in care plan meeting but decision to d/c home not made at the time. She wears [place] O2 ATC. No recent COPD exacerbations. She does feel more depressed but no SI/HI. Pain controlled. No recent falls. Appetite ok and she sleeps well.   Chronic diastolic heart failure - EF 60-65% (08-20-16); stable on Demadex 40 mg twice daily with k+ 40 meq twice daily   Dyslipidemia - stable on lipitor 10 mg daily; fish oil 2 gm daiyl. LDL 50; TG 85y  Hypertension - diet controlled. She does take demadex for HF  COPD with chronic respiratory failure with hypoxia - O2 dependent at 4L/min via [place]; she takes breo ellipta 100/25 mcg 1 puff daily; albuterol 2 puffs every 4 hours as needed; zyrtec 10 mg daily for seasonal allergy  Constipation - stable on fibercon 2 tabs daily and miralax daily as needed  Peripheral neuropathy - pain controlled on lyrica 150 mg twice daily  Chronic pain syndrome 2/2 fibromyalgia/neuropathy - pain stable on lyrica 150 mg twice  daily; oxymorphone 7.5 mg twice daily; percocet 10/325 mg every 6 hours as needed. She does benefit from this regimen  Recurrent major depression/hx panic attacks - mood stable on effexor xr 300 mg daily; abilify 2 mg every other day; trazodone 300 mg nightly   Morbid obesity: BMI 59.85(previous 60.38). She keeps diet orange crush at bed side. Poor exercise tolerance   DM - diet controlled. A1c 4.8%   Past Medical History:  Diagnosis Date  . Arthritis    "I feel like it's everywhere" (11/08/2014)  . Cellulitis and abscess of lower extremity 08/16/2016   right leg  . Chronic lower back pain   . COPD (chronic obstructive pulmonary disease) ([place])   . Depression   . Diabetes mellitus without complication ([place])    type 2   . Fibromyalgia   . Headache    "at least weekly; it's usually when I first wake up" (11/08/2014)  . Hyperlipidemia   . Hypertension   . HYPERTENSION, BENIGN SYSTEMIC 02/12/2007  . Memory disturbance   . Migraines 1970's - <2000   "they just went away"  . Neuropathy   . Normal echocardiogram 05/30/05   suboptimal study  . Obesity, morbid (more than 100 lbs over ideal weight or BMI > 40) (HCC)    obese since childhood  . Osteoarthritis   . Peripheral edema   . Pressure ulcer of foot    left  . Sepsis ([place])   . Shortness  of breath dyspnea   . Sleep apnea    "recently dx'd; haven't got my equipment yet" (11/08/2014)    Past Surgical History:  Procedure Laterality Date  . ABDOMINAL HERNIA REPAIR  2002  . APPENDECTOMY  1995  . CHOLECYSTECTOMY  1995  . TOTAL ABDOMINAL HYSTERECTOMY  1994    Patient Care Team: Mayo, Pete Pelt, MD as PCP - General (Family Medicine)  Social History   Social History  . Marital status: Single    Spouse name: N/A  . Number of children: 0  . Years of education: college-1   Occupational History  . Disabled     severe depression and morbid obesity   Social History Main Topics  . Smoking status: Former Smoker     Packs/day: 1.00    Years: 30.00    Types: Cigarettes    Quit date: 01/05/2009  . Smokeless tobacco: Never Used  . Alcohol use No  . Drug use: No  . Sexual activity: No   Other Topics Concern  . Not on file   Social History Narrative   Lives alone; has one brother.  Both of them live in poverty.  Brother does all her shopping and takes her to doctors apts.  Otherwise patient is confined to her home.      Patient does not drink caffeine.   Patient is right handed.     reports that she quit smoking about 8 years ago. Her smoking use included Cigarettes. She has a 30.00 pack-year smoking history. She has never used smokeless tobacco. She reports that she does not drink alcohol or use drugs.  Family History  Problem Relation Age of Onset  . Stroke Mother   . Dementia Father   . Prostate cancer Father   . Congestive Heart Failure Brother   . Diabetes Brother   . Hypertension Brother    Family Status  Relation Status  . Mother Deceased at age 25  . Father Deceased at age 1       Lewy body dementia  . Brother Alive  . Sister Alive    Immunization History  Administered Date(s) Administered  . Influenza,inj,Quad PF,36+ Mos 08/22/2015  . PPD Test 04/12/2017, 04/18/2017  . Pneumococcal Polysaccharide-23 01/17/2016  . Td 06/15/2002    Allergies  Allergen Reactions  . Aspirin Hives    REACTION: hives Hasn't had in years     Medications: Patient's Medications  New Prescriptions   No medications on file  Previous Medications   ALBUTEROL (PROVENTIL HFA;VENTOLIN HFA) 108 (90 BASE) MCG/ACT INHALER    Inhale 2 puffs into the lungs every 6 (six) hours as needed for wheezing or shortness of breath.   ARIPIPRAZOLE (ABILIFY) 2 MG TABLET    Take 1 tablet (2 mg total) by mouth every other day.   ASCORBIC ACID (VITAMIN C) 1000 MG TABLET    Take 1,000 mg by mouth 3 (three) times daily.    ATORVASTATIN (LIPITOR) 10 MG TABLET    Take 1 tablet (10 mg total) by mouth at bedtime.    CETIRIZINE (ZYRTEC) 10 MG TABLET    Take 10 mg by mouth daily.   CYCLOBENZAPRINE (FLEXERIL) 10 MG TABLET    Take 1 tablet (10 mg total) by mouth 3 (three) times daily as needed for muscle spasms.   FLUTICASONE FUROATE-VILANTEROL (BREO ELLIPTA) 100-25 MCG/INH AEPB    Inhale 1 puff into the lungs daily.    OMEGA-3 FATTY ACIDS (FISH OIL) 1000 MG CAPS    Take 2  capsules (2,000 mg total) by mouth at bedtime.   OXYCODONE-ACETAMINOPHEN (PERCOCET) 10-325 MG TABLET    Take one tablet by mouth every 6 hours as needed for pain   OXYGEN    Inhale 4 L into the lungs continuous. 4l/min    OXYMORPHONE (OPANA ER) 7.5 MG TB12 12 HR TABLET    Take 1 tablet (7.5 mg total) by mouth every 12 (twelve) hours.   POLYETHYLENE GLYCOL (MIRALAX / GLYCOLAX) PACKET    Take 17 g by mouth daily.    POTASSIUM CHLORIDE SA (K-DUR,KLOR-CON) 20 MEQ TABLET    Take 2 tablets once a day.   PREGABALIN (LYRICA) 150 MG CAPSULE    Take 1 capsule (150 mg total) by mouth 2 (two) times daily.   PSYLLIUM (EQ DAILY FIBER PO)    Take 2 capsules by mouth daily.    TORSEMIDE (DEMADEX) 20 MG TABLET    Take 40 mg by mouth 2 (two) times daily.   TRAZODONE (DESYREL) 50 MG TABLET    Take 50 mg by mouth at bedtime as needed. Take 2 tablets by mouth at bedtime    VENLAFAXINE XR (EFFEXOR-XR) 150 MG 24 HR CAPSULE    Give 2 capsules by mouth one time daily  Modified Medications   No medications on file  Discontinued Medications   NYSTATIN POWD    Apply to affected area as needed    Review of Systems  Musculoskeletal: Positive for arthralgias and gait problem.  Psychiatric/Behavioral: Positive for dysphoric mood.  All other systems reviewed and are negative.   Vitals:   07/17/17 1416  BP: 112/64  Pulse: 80  Resp: 18  Temp: 98.7 F (37.1 C)  SpO2: 96%  Weight: (!) 327 lb 3.2 oz (148.4 kg)  Height: 5\' 2"  (1.575 m)   Body mass index is 59.85 kg/m.  Physical Exam  Constitutional: She appears well-developed and well-nourished. She appears  distressed.  Sitting up in bed in NAD, [place] O2 intact, frail appearing  HENT:  Mouth/Throat: Oropharynx is clear and moist. No oropharyngeal exudate.  MMM; no oral thrush  Eyes: Pupils are equal, round, and reactive to light. No scleral icterus.  Neck: Neck supple. Carotid bruit is not present. No tracheal deviation present. No thyromegaly present.  Cardiovascular: Normal rate, regular rhythm and intact distal pulses.  Exam reveals no gallop and no friction rub.   Murmur (1/6 SEM) heard. +1 pitting LE edema b/l. No calf TTP. Chronic venous stasis changes noted  Pulmonary/Chest: Effort normal and breath sounds normal. No stridor. No respiratory distress. She has no wheezes. She has no rales.  Abdominal: Soft. Normal appearance and bowel sounds are normal. She exhibits no distension and no mass. There is no hepatomegaly. There is no tenderness. There is no rigidity, no rebound and no guarding. No hernia.  obese  Musculoskeletal: She exhibits edema and tenderness.  Lymphadenopathy:    She has no cervical adenopathy.  Neurological: She is alert.  Skin: Skin is warm and dry. Rash noted.  Psychiatric: She has a normal mood and affect. Her behavior is normal. Thought content normal.     Labs reviewed: Abstract on 04/22/2017  Component Date Value Ref Range Status  . Glucose 04/17/2017 99  mg/dL Final  . BUN 04/17/2017 12  4 - 21 mg/dL Final  . Creatinine 04/17/2017 0.7  0.5 - 1.1 mg/dL Final  . Potassium 04/17/2017 4.2  3.4 - 5.3 mmol/L Final  . Sodium 04/17/2017 143  137 - 147 mmol/L Final  .  Alkaline Phosphatase 04/17/2017 92  25 - 125 U/L Final  . ALT 04/17/2017 8  7 - 35 U/L Final  . AST 04/17/2017 11* 13 - 35 U/L Final  . Bilirubin, Total 04/17/2017 0.4  mg/dL Final  . Hemoglobin 04/17/2017 11.5* 12.0 - 16.0 g/dL Final  . HCT 04/17/2017 35* 36 - 46 % Final  . Neutrophils Absolute 04/17/2017 4  /L Final  . Platelets 04/17/2017 234  150 - 399 K/L Final  . WBC 04/17/2017 5.6  10^3/mL  Final    No results found.   Assessment/Plan   ICD-10-CM   1. Chronic pain syndrome G89.4   2. Other polyneuropathy G62.89   3. Chronic respiratory failure with hypoxia (HCC) J96.11   4. Chronic diastolic congestive heart failure (HCC) I50.32   5. Chronic obstructive pulmonary disease, unspecified COPD type ([place]) J44.9   6. Morbid obesity ([employer]) E66.01   7. Moderate episode of recurrent major depressive disorder (HCC) F33.1   8. Essential hypertension I10   9. Fibromyalgia M79.7   10. Venous stasis dermatitis of both lower extremities I87.2     D/w SW, plan to setup care plan meeting to discuss potential d/c. Her brother, who just had major abdominal sx himself, is her primary caregiver. She will need community support as well as family support at d/c. She may not do well at home due to her multiple co-morbidities.  Cont current meds as ordered  PT/OT/St as indicated  Will follow  Laycie Schriner S. Perlie Gold  [facility] [address] [place], [place] [address] [phone] Cell (Monday-Friday 8 AM - 5 PM) [phone] After 5 PM and follow prompts

## 2017-07-25 ENCOUNTER — Other Ambulatory Visit: Payer: Self-pay

## 2017-07-25 MED ORDER — OXYMORPHONE HCL ER 7.5 MG PO TB12: 7.5000 mg | ORAL_TABLET | Freq: Two times a day (BID) | ORAL | 0 refills | Status: DC

## 2017-07-25 MED ORDER — PREGABALIN 150 MG PO CAPS: 150.0000 mg | ORAL_CAPSULE | Freq: Two times a day (BID) | ORAL | 0 refills | Status: DC

## 2017-07-25 NOTE — Telephone Encounter (Signed)
RX faxed to [facility] @ [phone], phone number [phone] 

## 2017-07-26 DIAGNOSIS — J9611 Chronic respiratory failure with hypoxia: Secondary | ICD-10-CM | POA: Diagnosis not present

## 2017-07-28 ENCOUNTER — Non-Acute Institutional Stay (SKILLED_NURSING_FACILITY): Payer: Medicare Other | Admitting: Adult Health

## 2017-07-28 ENCOUNTER — Encounter: Payer: Self-pay | Admitting: Adult Health

## 2017-07-28 ENCOUNTER — Other Ambulatory Visit: Payer: Self-pay

## 2017-07-28 DIAGNOSIS — M797 Fibromyalgia: Secondary | ICD-10-CM

## 2017-07-28 DIAGNOSIS — J9611 Chronic respiratory failure with hypoxia: Secondary | ICD-10-CM

## 2017-07-28 DIAGNOSIS — F331 Major depressive disorder, recurrent, moderate: Secondary | ICD-10-CM | POA: Diagnosis not present

## 2017-07-28 DIAGNOSIS — M25569 Pain in unspecified knee: Secondary | ICD-10-CM | POA: Diagnosis not present

## 2017-07-28 DIAGNOSIS — J449 Chronic obstructive pulmonary disease, unspecified: Secondary | ICD-10-CM | POA: Diagnosis not present

## 2017-07-28 DIAGNOSIS — I5032 Chronic diastolic (congestive) heart failure: Secondary | ICD-10-CM

## 2017-07-28 DIAGNOSIS — M545 Low back pain: Secondary | ICD-10-CM | POA: Diagnosis not present

## 2017-07-28 DIAGNOSIS — G894 Chronic pain syndrome: Secondary | ICD-10-CM | POA: Diagnosis not present

## 2017-07-28 MED ORDER — PREGABALIN 150 MG PO CAPS: 150.0000 mg | ORAL_CAPSULE | Freq: Two times a day (BID) | ORAL | 0 refills | Status: DC

## 2017-07-28 MED ORDER — OXYMORPHONE HCL ER 7.5 MG PO TB12: 7.5000 mg | ORAL_TABLET | Freq: Two times a day (BID) | ORAL | 0 refills | Status: DC

## 2017-07-28 MED ORDER — OXYCODONE-ACETAMINOPHEN 10-325 MG PO TABS: ORAL_TABLET | ORAL | 0 refills | Status: DC

## 2017-07-28 NOTE — Telephone Encounter (Signed)
rx sent home with patient upon discharge

## 2017-07-28 NOTE — Progress Notes (Signed)
Location:   [facility] Room Number: [place] of Service:  SNF (31)    CODE STATUS: DNR  Allergies  Allergen Reactions  . Aspirin Hives    REACTION: hives Hasn't had in years     Chief Complaint  Patient presents with  . Discharge Note    Discharging to home    HPI:  She had originally been admitted to this facility while her brother had surgery and recovered. She had been living with her brother prior to admission to this facility. Her brother feels able to provide care for her at home. She desires to return home.  I do not recommend this discharged; however; I understand the need to return home and attempt to be successful.  She will need a bariatric wheelchair and home health for pt/ot/rn/cna/sw. She will need her prescriptions to be written and will need to follow up with her medical provider.     Past Medical History:  Diagnosis Date  . Arthritis    "I feel like it's everywhere" (11/08/2014)  . Cellulitis and abscess of lower extremity 08/16/2016   right leg  . Chronic lower back pain   . COPD (chronic obstructive pulmonary disease) ([place])   . Depression   . Diabetes mellitus without complication ([place])    type 2   . Fibromyalgia   . Headache    "at least weekly; it's usually when I first wake up" (11/08/2014)  . Hyperlipidemia   . Hypertension   . HYPERTENSION, BENIGN SYSTEMIC 02/12/2007  . Memory disturbance   . Migraines 1970's - <2000   "they just went away"  . Neuropathy   . Normal echocardiogram 05/30/05   suboptimal study  . Obesity, morbid (more than 100 lbs over ideal weight or BMI > 40) (H[facility])    obese since childhood  . Osteoarthritis   . Peripheral edema   . Pressure ulcer of foot    left  . Sepsis ([place])   . Shortness of breath dyspnea   . Sleep apnea    "recently dx'd; haven't got my equipment yet" (11/08/2014)    Past Surgical History:  Procedure Laterality Date  . ABDOMINAL HERNIA REPAIR  2002  . APPENDECTOMY  1995  .  CHOLECYSTECTOMY  1995  . TOTAL ABDOMINAL HYSTERECTOMY  1994    Social History   Social History  . Marital status: Single    Spouse name: N/A  . Number of children: 0  . Years of education: college-1   Occupational History  . Disabled     severe depression and morbid obesity   Social History Main Topics  . Smoking status: Former Smoker    Packs/day: 1.00    Years: 30.00    Types: Cigarettes    Quit date: 01/05/2009  . Smokeless tobacco: Never Used  . Alcohol use No  . Drug use: No  . Sexual activity: No   Other Topics Concern  . Not on file   Social History Narrative   Lives alone; has one brother.  Both of them live in poverty.  Brother does all her shopping and takes her to doctors apts.  Otherwise patient is confined to her home.      Patient does not drink caffeine.   Patient is right handed.   Family History  Problem Relation Age of Onset  . Stroke Mother   . Dementia Father   . Prostate cancer Father   . Congestive Heart Failure Brother   . Diabetes Brother   .  Hypertension Brother     VITAL SIGNS BP 118/66   Pulse 82   Temp 97.7 F (36.5 C)   Resp 20   Ht 5\' 2"  (1.575 m)   Wt (!) 335 lb 6.4 oz (152.1 kg)   SpO2 97%   BMI 61.35 kg/m   Patient's Medications  New Prescriptions   No medications on file  Previous Medications   ALBUTEROL (PROVENTIL HFA;VENTOLIN HFA) 108 (90 BASE) MCG/ACT INHALER    Inhale 2 puffs into the lungs every 6 (six) hours as needed for wheezing or shortness of breath.   ARIPIPRAZOLE (ABILIFY) 2 MG TABLET    Take 1 tablet (2 mg total) by mouth every other day.   ASCORBIC ACID (VITAMIN C) 1000 MG TABLET    Take 1,000 mg by mouth 3 (three) times daily.    ATORVASTATIN (LIPITOR) 10 MG TABLET    Take 1 tablet (10 mg total) by mouth at bedtime.   CETIRIZINE (ZYRTEC) 10 MG TABLET    Take 10 mg by mouth daily.   CYCLOBENZAPRINE (FLEXERIL) 10 MG TABLET    Take 1 tablet (10 mg total) by mouth 3 (three) times daily as needed for muscle  spasms.   FLUTICASONE FUROATE-VILANTEROL (BREO ELLIPTA) 100-25 MCG/INH AEPB    Inhale 1 puff into the lungs daily.    OMEGA-3 FATTY ACIDS (FISH OIL) 1000 MG CAPS    Take 2 capsules (2,000 mg total) by mouth at bedtime.   OXYCODONE-ACETAMINOPHEN (PERCOCET) 10-325 MG TABLET    Take one tablet by mouth every 6 hours as needed for pain   OXYGEN    Inhale 4 L into the lungs continuous. 4l/min    OXYMORPHONE (OPANA ER) 7.5 MG TB12 12 HR TABLET    Take 1 tablet (7.5 mg total) by mouth every 12 (twelve) hours.   POLYETHYLENE GLYCOL (MIRALAX / GLYCOLAX) PACKET    Take 17 g by mouth daily.    POTASSIUM CHLORIDE SA (K-DUR,KLOR-CON) 20 MEQ TABLET    Take 2 tablets once a day.   PREGABALIN (LYRICA) 150 MG CAPSULE    Take 1 capsule (150 mg total) by mouth 2 (two) times daily.   TORSEMIDE (DEMADEX) 20 MG TABLET    Take 40 mg by mouth 2 (two) times daily.   TRAZODONE (DESYREL) 50 MG TABLET    Take 50 mg by mouth at bedtime as needed.    VENLAFAXINE XR (EFFEXOR-XR) 150 MG 24 HR CAPSULE    Give 2 capsules by mouth one time daily  Modified Medications   No medications on file  Discontinued Medications   PSYLLIUM (EQ DAILY FIBER PO)    Take 2 capsules by mouth daily.      SIGNIFICANT DIAGNOSTIC EXAMS  NO NEW EXAMS   LABS REVIEWED: PREVIOUS    04-17-17:wbc 5.6; hgb 11.5; hct 35.0; mcv 92.7; plt 234; glucose 99; bun 12.2; creat 0.66; k+ 4.2; na++ 143; ca 9.8; liver normal albumin 4.0 tsh 0.94; hgb a1c 4.8 chol 116; ldl 50; trig 85; hdl 49; vit D 12.39   NO NEW LABS.    Review of Systems  Constitutional: Negative for malaise/fatigue.  Respiratory: Negative for cough and shortness of breath.   Cardiovascular: Negative for chest pain, palpitations and leg swelling.  Gastrointestinal: Negative for abdominal pain, constipation and heartburn.  Musculoskeletal: Positive for back pain and myalgias. Negative for joint pain.       Has chronic back and leg pain   Skin: Negative.   Neurological: Negative for  dizziness.  Physical Exam  Constitutional: She is oriented to person, place, and time. No distress.  Morbidly obese   Eyes: Conjunctivae are normal.  Neck: Neck supple. No JVD present. No thyromegaly present.  Cardiovascular: Normal rate, regular rhythm, normal heart sounds and intact distal pulses.   Respiratory: Effort normal. No respiratory distress. She has no wheezes.  Breath sounds diminished 02 4L/[place] dependent   GI: Soft. Bowel sounds are normal. She exhibits no distension. There is no tenderness.  Musculoskeletal: She exhibits edema.  Able to move all extremities Has limited movement due to her obesity  Has bilateral lower extremity lymphedema   Lymphadenopathy:    She has no cervical adenopathy.  Neurological: She is alert and oriented to person, place, and time.  Skin: Skin is warm and dry. She is not diaphoretic.  Bilateral lower legs discolored Has dry skin on bilateral lower legs   Psychiatric: She has a normal mood and affect.     ASSESSMENT/ PLAN:  Patient is being discharged with the following home health services:  Pt/ot/rn/cna/sw: to evaluate and treat as indicated for gait balance strength adl training medication management adl care; and community resources  Patient is being discharged with the following durable medical equipment: standard bariatric wheelchair with cushion; anti-tippers; brake extensions in order for her to maintain her current level of independence which cannot be achieved with a walker.    Patient has been advised to f/u with their PCP in 1-2 weeks to bring them up to date on their rehab stay.  Social services at facility was responsible for arranging this appointment.  Pt was provided with a 30 day supply of prescriptions for medications and refills must be obtained from their PCP.  For controlled substances, a more limited supply may be provided adequate until PCP appointment only.  A 30 day supply of her non-narcotic prescriptions have  been written as in the medication list above  The narcotic prescriptions have been written as follows:  #60 lyrica 150 mg  #10 percocet 10/325 mg #30 opana 7.5 mg  Time with patient and discharge process was 45 minutes. The patient was educated on the home health services she will receive; her medications; her follow appointments and her needed dme. She did verbalize understanding    Ok Edwards NP [facility]  Contact [phone] Monday through Friday 8am- 5pm  After hours call [phone]

## 2017-07-31 DIAGNOSIS — J441 Chronic obstructive pulmonary disease with (acute) exacerbation: Secondary | ICD-10-CM | POA: Diagnosis not present

## 2017-08-06 ENCOUNTER — Telehealth: Payer: Self-pay | Admitting: Internal Medicine

## 2017-08-06 DIAGNOSIS — I5032 Chronic diastolic (congestive) heart failure: Secondary | ICD-10-CM | POA: Diagnosis not present

## 2017-08-06 DIAGNOSIS — M797 Fibromyalgia: Secondary | ICD-10-CM | POA: Diagnosis not present

## 2017-08-06 DIAGNOSIS — I110 Hypertensive heart disease with heart failure: Secondary | ICD-10-CM | POA: Diagnosis not present

## 2017-08-06 DIAGNOSIS — E785 Hyperlipidemia, unspecified: Secondary | ICD-10-CM | POA: Diagnosis not present

## 2017-08-06 DIAGNOSIS — M1991 Primary osteoarthritis, unspecified site: Secondary | ICD-10-CM | POA: Diagnosis not present

## 2017-08-06 DIAGNOSIS — G4733 Obstructive sleep apnea (adult) (pediatric): Secondary | ICD-10-CM | POA: Diagnosis not present

## 2017-08-06 DIAGNOSIS — R7303 Prediabetes: Secondary | ICD-10-CM | POA: Diagnosis not present

## 2017-08-06 DIAGNOSIS — M545 Low back pain: Secondary | ICD-10-CM | POA: Diagnosis not present

## 2017-08-06 DIAGNOSIS — J449 Chronic obstructive pulmonary disease, unspecified: Secondary | ICD-10-CM | POA: Diagnosis not present

## 2017-08-06 DIAGNOSIS — G43909 Migraine, unspecified, not intractable, without status migrainosus: Secondary | ICD-10-CM | POA: Diagnosis not present

## 2017-08-06 DIAGNOSIS — G9009 Other idiopathic peripheral autonomic neuropathy: Secondary | ICD-10-CM | POA: Diagnosis not present

## 2017-08-06 NOTE — Telephone Encounter (Signed)
Patient calls and request PCP to call her. She has some questions about her medications. Please call patient at home.

## 2017-08-06 NOTE — Telephone Encounter (Signed)
Returned patient's call. All questions answered. Will see patient in clinic for an appointment 8/28.

## 2017-08-06 NOTE — Telephone Encounter (Signed)
Will forward to MD. [name],CMA  

## 2017-08-07 ENCOUNTER — Other Ambulatory Visit: Payer: Self-pay

## 2017-08-07 ENCOUNTER — Ambulatory Visit: Payer: Self-pay

## 2017-08-07 NOTE — Patient Outreach (Signed)
[facility]) Care Management  08/07/17  Kathleen Roy 02-10-1961 [id number]  RNCM received notification from [name], [place] on 08/05/2017 that patient was discharging to home from SNF soon.   Patient has past medical history of chronic diastolic heart failure (EF 60-65%), dyslipidemia, HTN, COPD with chronic respiratory failure with hypoxia, O2 dependent on 4 lpm/[place], constipation, peripheral neuropathy, chronic pain with fibromyalgia, recurrent major depression, morbid obesity (BMI 60.38, up from 59.13), diabetes (A1C 4.8).    RNCM is currently active with patient's brother, Kathleen Roy who notified RNCM today that patient discharged to home on 07/28/2017.  Per review of discharge note, patient discharged home with home health and is to see PCP in 1-2 weeks  RNCM attempted to talk with patient today, but her brother stated that she had just fallen asleep after having a long night. Requested callback at another time.

## 2017-08-08 ENCOUNTER — Other Ambulatory Visit: Payer: Self-pay

## 2017-08-08 DIAGNOSIS — M545 Low back pain: Secondary | ICD-10-CM | POA: Diagnosis not present

## 2017-08-08 DIAGNOSIS — M1991 Primary osteoarthritis, unspecified site: Secondary | ICD-10-CM | POA: Diagnosis not present

## 2017-08-08 DIAGNOSIS — E785 Hyperlipidemia, unspecified: Secondary | ICD-10-CM | POA: Diagnosis not present

## 2017-08-08 DIAGNOSIS — R7303 Prediabetes: Secondary | ICD-10-CM | POA: Diagnosis not present

## 2017-08-08 DIAGNOSIS — I110 Hypertensive heart disease with heart failure: Secondary | ICD-10-CM | POA: Diagnosis not present

## 2017-08-08 DIAGNOSIS — G43909 Migraine, unspecified, not intractable, without status migrainosus: Secondary | ICD-10-CM | POA: Diagnosis not present

## 2017-08-08 DIAGNOSIS — G4733 Obstructive sleep apnea (adult) (pediatric): Secondary | ICD-10-CM | POA: Diagnosis not present

## 2017-08-08 DIAGNOSIS — I5032 Chronic diastolic (congestive) heart failure: Secondary | ICD-10-CM | POA: Diagnosis not present

## 2017-08-08 DIAGNOSIS — M797 Fibromyalgia: Secondary | ICD-10-CM | POA: Diagnosis not present

## 2017-08-08 DIAGNOSIS — G9009 Other idiopathic peripheral autonomic neuropathy: Secondary | ICD-10-CM | POA: Diagnosis not present

## 2017-08-08 DIAGNOSIS — J449 Chronic obstructive pulmonary disease, unspecified: Secondary | ICD-10-CM | POA: Diagnosis not present

## 2017-08-11 NOTE — Patient Outreach (Signed)
[facility]) Care Management  Late entry for 08/08/2017  Kathleen Roy 1961/09/04 [id number]  RNCM received notification from [name], [place] on 08/05/2017 that patient was discharging to home from SNF soon.   Patient has past medical history of chronic diastolic heart failure (EF 60-65%), dyslipidemia, HTN, COPD with chronic respiratory failure with hypoxia, O2 dependent on 4 lpm/[place], constipation, peripheral neuropathy, chronic pain with fibromyalgia, recurrent major depression, morbid obesity (BMI 60.38, up from 59.13), diabetes (A1C 4.8).   Successful outreach completed with patient. Patient identification verified. RNCM explained role and [facility] Care Management Services and she was agreeable with outreach.  Patient stated that she went to the skilled nursing facility because her brother [name] had cancer and had to have surgery. She stated that he is her caregiver and she needed someone to take care of her. She stated that she ended up staying in the facility for a total of about 3.5 months. She stated that she did participate in some therapy while she was there.  Patient stated that she has home health via [facility]. She stated that she has RN, PT, OT and an aide (that comes out twice a week). She stated that she needs assistance with all adls. She is on home O2 at 4 lpm. Patient has a walker, bariatric bedside commode set up in the living room and a transport chair.  Patient stated that she did not have a good night of sleep last night. She stated that her brother tried to wake her up around 1:30 am, but he was not able to wake her up. She stated that she woke up around 6 am needing to go to the bathroom and was "about to starve." She stated that her brother ended up staying up til around 8:30 this morning. She was unable to state clearly why she felt she had a bad night or why her brother had tried to wake her in the middle of the night.  Patient stated that she used  to have a DNR on the wall in her living room and took it with her to the facility, but it was not returned home with her. RNCM provided education about the DNR form and patient discussed her wishes not to be resuscitated due the quality of her life. RNCM advised that patient will need to get her PCP to fill out a new one if her other one is lost and advised it is necessary to keep with her at all times in order for her wishes to be known and followed. Patient verbalized understanding.   Patient became tearful while on the phone, stating that she is in a lot of pain. She stated that the pain is all over and ongoing. Currently, she states that she feels that her back is going to break. Patient admits that she does have pain medication, but stated that it does not touch her pain. She stated that her pain stems from her neuropathy, fibromyalgia, etc. And that she is in constant pain unless she is sleeping. She did state that occasionally her pain does wake her up. Due to patient being in pain and not feeling up to continued conversation, call was ended prior to completing assessment. RNCM attempted to schedule a home visit, but patient declines one for next week, stating that they have a lot of appointments and people coming in. She would prefer if we wait until one day next week and see about scheduling one for the following week.  She is agreeable to telephone outreach next week for transition of care outreach.  Updated depression screening to be completed with next outreach. Depression screen [facility] 2/9 12/17/2016 02/14/2016 04/25/2015 03/21/2015 01/20/2015  Decreased Interest 1 0 2 3 0  Down, Depressed, Hopeless 1 1 1 3  0  PHQ - 2 Score 2 1 3 6  0  Altered sleeping 1 - 3 3 -  Tired, decreased energy 1 - 3 3 -  Change in appetite 1 - 0 0 -  Feeling bad or failure about yourself  1 - 3 3 -  Trouble concentrating 1 - 2 3 -  Moving slowly or fidgety/restless 1 - 0 0 -  Suicidal thoughts 0 - 0 0 -  PHQ-9 Score 8 - 14  18 -  Difficult doing work/chores Very difficult - Very difficult Somewhat difficult -  Some recent data might be hidden   Fall Risk  08/08/2017 12/17/2016 08/22/2015  Falls in the past year? Yes Yes No  Number falls in past yr: 2 or more 2 or more -  Injury with Fall? Yes Yes -  Risk Factor Category  High Fall Risk High Fall Risk -  Risk for fall due to : History of fall(s);Impaired balance/gait;Medication side effect;Impaired mobility History of fall(s);Impaired balance/gait;Impaired mobility -  Follow up Education provided;Falls prevention discussed;Follow up appointment Education provided;Falls prevention discussed -     Plan: RNCM to continue to follow patient for transition of care outreach. Plan to schedule a home visit at next outreach within the next 2-3 weeks. [facility] CM Care Plan Problem One     Most Recent Value  Care Plan Problem One  Recent discharge from SNF to home following long term care stay  Role Documenting the Problem One  Care Management [place] for Problem One  Active  [facility] Long Term Goal   Patient will not have a hospital admission within the next 31 days.  [facility] Long Term Goal Start Date  08/08/17  Interventions for Problem One Long Term Goal  RNCM provided education on importance of attending follow up appointment with PCP (last noted office visit was in June of 2017). RNCM counseled patient on medication adherence.  [facility] CM Short Term Goal #1   Patient will locate or obtain a new DNR form within the next week.  [facility] CM Short Term Goal #1 Start Date  08/08/17  Interventions for Short Term Goal #1  RNCM to reach out to SNF to see if DNR was left there. If unable to locate DNR, RNCM will outreach to PCP to get a new one completed.  [facility] CM Short Term Goal #2   Patient will attend a follow up appointment with her PCP within the next 2 weeks.  [facility] CM Short Term Goal #2 Start Date  08/08/17  Interventions for Short Term Goal #2  RNCM educated patient on importance of  attending follow up appointment and ensured patient has an appointment scheduled (08/12/2017).     [place] R. Henryetta Corriveau, RN, BSN, [place] Management Coordinator [phone]

## 2017-08-12 ENCOUNTER — Ambulatory Visit: Payer: Medicare Other | Admitting: Internal Medicine

## 2017-08-12 DIAGNOSIS — G43909 Migraine, unspecified, not intractable, without status migrainosus: Secondary | ICD-10-CM | POA: Diagnosis not present

## 2017-08-12 DIAGNOSIS — G9009 Other idiopathic peripheral autonomic neuropathy: Secondary | ICD-10-CM | POA: Diagnosis not present

## 2017-08-12 DIAGNOSIS — I110 Hypertensive heart disease with heart failure: Secondary | ICD-10-CM | POA: Diagnosis not present

## 2017-08-12 DIAGNOSIS — R7303 Prediabetes: Secondary | ICD-10-CM | POA: Diagnosis not present

## 2017-08-12 DIAGNOSIS — E785 Hyperlipidemia, unspecified: Secondary | ICD-10-CM | POA: Diagnosis not present

## 2017-08-12 DIAGNOSIS — I5032 Chronic diastolic (congestive) heart failure: Secondary | ICD-10-CM | POA: Diagnosis not present

## 2017-08-12 DIAGNOSIS — M797 Fibromyalgia: Secondary | ICD-10-CM | POA: Diagnosis not present

## 2017-08-12 DIAGNOSIS — M545 Low back pain: Secondary | ICD-10-CM | POA: Diagnosis not present

## 2017-08-12 DIAGNOSIS — M1991 Primary osteoarthritis, unspecified site: Secondary | ICD-10-CM | POA: Diagnosis not present

## 2017-08-12 DIAGNOSIS — J449 Chronic obstructive pulmonary disease, unspecified: Secondary | ICD-10-CM | POA: Diagnosis not present

## 2017-08-12 DIAGNOSIS — G4733 Obstructive sleep apnea (adult) (pediatric): Secondary | ICD-10-CM | POA: Diagnosis not present

## 2017-08-13 DIAGNOSIS — G43909 Migraine, unspecified, not intractable, without status migrainosus: Secondary | ICD-10-CM | POA: Diagnosis not present

## 2017-08-13 DIAGNOSIS — M1991 Primary osteoarthritis, unspecified site: Secondary | ICD-10-CM | POA: Diagnosis not present

## 2017-08-13 DIAGNOSIS — M545 Low back pain: Secondary | ICD-10-CM | POA: Diagnosis not present

## 2017-08-13 DIAGNOSIS — R7303 Prediabetes: Secondary | ICD-10-CM | POA: Diagnosis not present

## 2017-08-13 DIAGNOSIS — G4733 Obstructive sleep apnea (adult) (pediatric): Secondary | ICD-10-CM | POA: Diagnosis not present

## 2017-08-13 DIAGNOSIS — M797 Fibromyalgia: Secondary | ICD-10-CM | POA: Diagnosis not present

## 2017-08-13 DIAGNOSIS — I110 Hypertensive heart disease with heart failure: Secondary | ICD-10-CM | POA: Diagnosis not present

## 2017-08-13 DIAGNOSIS — G9009 Other idiopathic peripheral autonomic neuropathy: Secondary | ICD-10-CM | POA: Diagnosis not present

## 2017-08-13 DIAGNOSIS — I5032 Chronic diastolic (congestive) heart failure: Secondary | ICD-10-CM | POA: Diagnosis not present

## 2017-08-13 DIAGNOSIS — E785 Hyperlipidemia, unspecified: Secondary | ICD-10-CM | POA: Diagnosis not present

## 2017-08-13 DIAGNOSIS — J449 Chronic obstructive pulmonary disease, unspecified: Secondary | ICD-10-CM | POA: Diagnosis not present

## 2017-08-14 ENCOUNTER — Telehealth: Payer: Self-pay

## 2017-08-14 DIAGNOSIS — I110 Hypertensive heart disease with heart failure: Secondary | ICD-10-CM | POA: Diagnosis not present

## 2017-08-14 DIAGNOSIS — E785 Hyperlipidemia, unspecified: Secondary | ICD-10-CM | POA: Diagnosis not present

## 2017-08-14 DIAGNOSIS — G4733 Obstructive sleep apnea (adult) (pediatric): Secondary | ICD-10-CM | POA: Diagnosis not present

## 2017-08-14 DIAGNOSIS — G9009 Other idiopathic peripheral autonomic neuropathy: Secondary | ICD-10-CM | POA: Diagnosis not present

## 2017-08-14 DIAGNOSIS — J449 Chronic obstructive pulmonary disease, unspecified: Secondary | ICD-10-CM | POA: Diagnosis not present

## 2017-08-14 DIAGNOSIS — R7303 Prediabetes: Secondary | ICD-10-CM | POA: Diagnosis not present

## 2017-08-14 DIAGNOSIS — M797 Fibromyalgia: Secondary | ICD-10-CM | POA: Diagnosis not present

## 2017-08-14 DIAGNOSIS — I5032 Chronic diastolic (congestive) heart failure: Secondary | ICD-10-CM | POA: Diagnosis not present

## 2017-08-14 DIAGNOSIS — M1991 Primary osteoarthritis, unspecified site: Secondary | ICD-10-CM | POA: Diagnosis not present

## 2017-08-14 DIAGNOSIS — G43909 Migraine, unspecified, not intractable, without status migrainosus: Secondary | ICD-10-CM | POA: Diagnosis not present

## 2017-08-14 DIAGNOSIS — M545 Low back pain: Secondary | ICD-10-CM | POA: Diagnosis not present

## 2017-08-14 NOTE — Telephone Encounter (Signed)
Patient called to say that during a home care visit by Dr. Brett Albino, she failed to tell her that she was still using the Nystatin powder.Kathleen Roy

## 2017-08-15 ENCOUNTER — Other Ambulatory Visit: Payer: Self-pay | Admitting: Internal Medicine

## 2017-08-15 ENCOUNTER — Other Ambulatory Visit: Payer: Self-pay

## 2017-08-15 DIAGNOSIS — E785 Hyperlipidemia, unspecified: Secondary | ICD-10-CM | POA: Diagnosis not present

## 2017-08-15 DIAGNOSIS — I5032 Chronic diastolic (congestive) heart failure: Secondary | ICD-10-CM | POA: Diagnosis not present

## 2017-08-15 DIAGNOSIS — G253 Myoclonus: Secondary | ICD-10-CM

## 2017-08-15 DIAGNOSIS — M1991 Primary osteoarthritis, unspecified site: Secondary | ICD-10-CM | POA: Diagnosis not present

## 2017-08-15 DIAGNOSIS — J309 Allergic rhinitis, unspecified: Secondary | ICD-10-CM | POA: Insufficient documentation

## 2017-08-15 DIAGNOSIS — R413 Other amnesia: Secondary | ICD-10-CM

## 2017-08-15 DIAGNOSIS — G9009 Other idiopathic peripheral autonomic neuropathy: Secondary | ICD-10-CM | POA: Diagnosis not present

## 2017-08-15 DIAGNOSIS — J9611 Chronic respiratory failure with hypoxia: Secondary | ICD-10-CM

## 2017-08-15 DIAGNOSIS — J449 Chronic obstructive pulmonary disease, unspecified: Secondary | ICD-10-CM | POA: Diagnosis not present

## 2017-08-15 DIAGNOSIS — I110 Hypertensive heart disease with heart failure: Secondary | ICD-10-CM | POA: Diagnosis not present

## 2017-08-15 DIAGNOSIS — R7303 Prediabetes: Secondary | ICD-10-CM | POA: Diagnosis not present

## 2017-08-15 DIAGNOSIS — Z7409 Other reduced mobility: Secondary | ICD-10-CM | POA: Insufficient documentation

## 2017-08-15 DIAGNOSIS — G43909 Migraine, unspecified, not intractable, without status migrainosus: Secondary | ICD-10-CM | POA: Diagnosis not present

## 2017-08-15 DIAGNOSIS — J302 Other seasonal allergic rhinitis: Secondary | ICD-10-CM

## 2017-08-15 DIAGNOSIS — M545 Low back pain: Secondary | ICD-10-CM | POA: Diagnosis not present

## 2017-08-15 DIAGNOSIS — M797 Fibromyalgia: Secondary | ICD-10-CM | POA: Diagnosis not present

## 2017-08-15 DIAGNOSIS — G4733 Obstructive sleep apnea (adult) (pediatric): Secondary | ICD-10-CM | POA: Diagnosis not present

## 2017-08-15 DIAGNOSIS — E1142 Type 2 diabetes mellitus with diabetic polyneuropathy: Secondary | ICD-10-CM

## 2017-08-15 MED ORDER — CETIRIZINE HCL 10 MG PO TABS: 10.0000 mg | ORAL_TABLET | Freq: Every day | ORAL | 2 refills | Status: DC

## 2017-08-15 NOTE — Assessment & Plan Note (Signed)
Well-controlled on Zyrtec - Refill provided for Zyrtec

## 2017-08-15 NOTE — Assessment & Plan Note (Signed)
Stable. Due to COPD, OSA, and OHS. On 4L O2 at home. - Continue Breo Ellipta 1 puff daily and Albuterol prn - Will discuss CPAP at next visit

## 2017-08-15 NOTE — Assessment & Plan Note (Signed)
Patient screened using mini cog assessment. Able to recall 3/3 words immediately, had normal clock drawing, and able to remember 2/3 words for delayed recall. Chronic narcotic use and non-compliance with CPAP may be contributing. - No indication for further testing at this time - Will monitor - Can consider further evaluation with [facility] if worsens

## 2017-08-15 NOTE — Patient Outreach (Signed)
[facility]) Care Management  08/15/17  Kathleen Roy 1961/11/26 [id number]  Successful outreach completed with patient. Patient identification verified.   Patient stated that she has been doing pretty good.  She stated that she is currently home alone during call. Stated that her brother has gone to the grocery store. Patient is immobile at home and dependent on brother as her caregiver for all adls and iadls. Per patient's last note with PCP, she is immobile due to her chronic pain, morbid obesity and chronic lymphedema. RNCM inquired about her being home alone and she stated that she has her bedside commode beside her chair and is able to pivot over to it if she needs to. RNCM assessed if she has any food/beverages available to her when her brother is away and she stated that they are currently out of groceries and that is why he is gone to the store. She denies any urgent needs or concerns with him being away from home.  Patient's PCP came out yesterday for a home visit. She stated that it was Dr. Brett Albino and Dr. Erasmo Leventhal. She stated they have just started doing some home visits. She stated she has been having some tremors in her hands and she talked with them about that. She stated that they are not sure what is causing them. She stated that this has been going on for a long time now. Her home health nurse is supposed to draw some blood next week to check her kidneys or liver function and her A1C because it has not been checked since 2017. Discussed patient's diabetes with her. She stated that she does not check her blood sugars because they had been good prior to her SNF stay. She stated that she did get them checked while at the SNF, and they were always in the 90's. Her last A1C was 6.3% in 2017.  Patient complained of being forgetful at times and stated that this is frustrating for her. Patient was alert and oriented for call, but could not think of some things she was trying to  tell RNCM about her medical history.  Patient still has home health via [facility]. She has a nurse coming out weekly, and a home health aide coming twice a week. She also has PT and OT.  Patient declined to schedule home visit at present time due to brother not being at home and she does not know what he has scheduled on his calendar for next week. She was agreeable to telephone outreach again next week and we can revisit the home visit at that time. She currently has no concerns or questions. Plan: RNCM to continue to follow patient for transition of care outreach. [place] R. Trenell Moxey, RN, BSN, [place] Management Coordinator [phone]

## 2017-08-15 NOTE — Assessment & Plan Note (Signed)
Present in the upper extremities bilaterally. Has been going on for several years. 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 No recent head injuries that could be contributing. Liver/kidney failure also on the differential, although patient had normal renal/hepatic function in 04/2017. No fevers/chills and prolonged duration makes infection unlikely. Drug-induced myoclonus is also a possibility and patient is on a number of different medications that could be contributing- Lyrica, Opana ER, Percocet, Flexeril.  - Will contact home health RN to see if she can draw CBC, CMP, and ammonia

## 2017-08-15 NOTE — Assessment & Plan Note (Signed)
Due to pain, morbid obesity, and chronic lymphedema. Has ramp to help her leave the house for appointments when needed. - Continue HHPT, HHOT, HH RN, and [place] aide - [facility] forms filled out to help patient obtain transportation for doctor's appointments.

## 2017-08-15 NOTE — Assessment & Plan Note (Signed)
Last A1c 6.3% in 05/2016. Unable to tolerate Metformin in the past due to GI side effects. Has not taken Glipizide in 6 months. - Will contact home health RN to see if she would be able to draw blood to recheck A1c. - Continue to hold Glipizide

## 2017-08-15 NOTE — Progress Notes (Signed)
[facility] Phone: [phone]  Subjective:  Kathleen Roy is a 56 year old female seen for a home visit with Dr. Wendy Poet.  Memory Loss: Has been going on for years. States this is very worrisome to her. Has been going on for years. Has to write everything down to be able to remember it. States she sometimes will completely forget what she is saying the middle of a phone call. Her mind will just go blank. She thinks she is too young to have memory loss.  Chronic Respiratory Failure: Using 4L O2 all the time at home. Has been stable on this for a few years. Using Breo daily at home, but admits that she often forgets to take this. Also has Albuterol prn for shortness of breath, but states that she cannot remember the last time she used this. Endorses nighttime cough. Does not use CPAP at night.  T2DM: Previously taking Glipizide 5mg  daily, but has not taken this in 6 months because she was having consistent blood sugars <100. Had her blood sugars checked twice in the nursing home over the last 3 months and both were in the 90s. Denies any low blood sugars.   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 Notes that she has difficulty writing. States the shaking is "about to drive her up the wall". Seems to be getting a little bit worse. Cannot remember if she has seen a neurologist for this in the past.  Allergic Rhinitis: States she was having a lot of problems with rhinorrhea and nasal congestion this year. Also having some facial pain. She started taking Zyrtec and her rhinorrhea and congestion got much better. Requesting refill of Zyrtec.  Immobility: Spends all day in recliner and also sleeps in recliner. Has bedside commode next to the recliner that she can pivot into. Receiving HHPT and HHOT. Also has Reader RN that comes to the house once per week and [place] aide that comes to the house twice per week.  ROS: See HPI for  pertinent positives and negatives  Past Medical History- HTN, chronic diastolic heart failure, chronic lymphedema, chronic venous stasis insufficiency, chronic respiratory failure 2/2 COPD/OSA/OHS on 4L O2 at home, T2DM, chronic low back pain, chronic knee pain, morbid obesity, major depressive disorder.  Family history reviewed for today's visit. No changes.  Social history- patient is a former smoker. Quit in 2010.  Objective: There were no vitals taken for this visit. Gen: NAD, alert, cooperative with exam, sitting up in chair HEENT: NCAT, EOMI, MMM Neck: FROM, supple CV: RRR, no murmur Resp: CTABL, decreased air movement throughout all lung fields, no wheezes, normal work of breathing, [place] in place Msk: Chronic lymphedema present in lower extremities bilaterally. Neuro: Alert and oriented, clonus present in the upper extremities bilaterally, clonus worsened with dorsiflexion at the wrists, normal finger-to-nose, able to trace a drawing without any issues. Skin: Chronic hyperpigmented changes to the lower extremities bilaterally Psych: Appropriate behavior  Mini-cog: Able to recall 3/3 words immediately, clock drawing normal, able to recall 2/3 words on delayed recall, able to remember the 3rd word when hint provided.  Assessment/Plan: Memory Loss: Patient screened using mini cog assessment. Able to recall 3/3 words immediately, had normal clock drawing, and able to remember 2/3 words for delayed recall. Chronic narcotic use and non-compliance with CPAP may be contributing. - No indication for further testing at this time - Will monitor - Can consider further evaluation with [facility] if  worsens  Chronic Respiratory Failure with Hypoxia: Stable. Due to COPD, OSA, and OHS. On 4L O2 at home. - Continue Breo Ellipta 1 puff daily and Albuterol prn - Will discuss CPAP at next visit  T2DM: Last A1c 6.3% in 05/2016. Unable to tolerate Metformin in the past due to GI side effects. Has not  taken Glipizide in 6 months. - Will contact home health RN to see if she would be able to draw blood to recheck A1c. 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 No recent head injuries that could be contributing. Liver/kidney failure also on the differential, although patient had normal renal/hepatic function in 04/2017. No fevers/chills and prolonged duration makes infection unlikely. Drug-induced myoclonus is also a possibility and patient is on a number of different medications that could be contributing- Lyrica, Opana ER, Percocet, Flexeril.  - Will contact home health RN to see if she can draw CBC, CMP, and ammonia   Allergic Rhinitis: Well-controlled on Zyrtec - Refill provided for Zyrtec  Immobility: Due to pain, morbid obesity, and chronic lymphedema. Has ramp to help her leave the house for appointments when needed. - Continue HHPT, HHOT, HH RN, and [place] aide - SCAT forms filled out to help patient obtain transportation for doctor's appointments.  Hyman Bible, MD PGY-3

## 2017-08-19 DIAGNOSIS — G9009 Other idiopathic peripheral autonomic neuropathy: Secondary | ICD-10-CM | POA: Diagnosis not present

## 2017-08-19 DIAGNOSIS — I5032 Chronic diastolic (congestive) heart failure: Secondary | ICD-10-CM | POA: Diagnosis not present

## 2017-08-19 DIAGNOSIS — G43909 Migraine, unspecified, not intractable, without status migrainosus: Secondary | ICD-10-CM | POA: Diagnosis not present

## 2017-08-19 DIAGNOSIS — E785 Hyperlipidemia, unspecified: Secondary | ICD-10-CM | POA: Diagnosis not present

## 2017-08-19 DIAGNOSIS — M545 Low back pain: Secondary | ICD-10-CM | POA: Diagnosis not present

## 2017-08-19 DIAGNOSIS — M1991 Primary osteoarthritis, unspecified site: Secondary | ICD-10-CM | POA: Diagnosis not present

## 2017-08-19 DIAGNOSIS — J449 Chronic obstructive pulmonary disease, unspecified: Secondary | ICD-10-CM | POA: Diagnosis not present

## 2017-08-19 DIAGNOSIS — R7303 Prediabetes: Secondary | ICD-10-CM | POA: Diagnosis not present

## 2017-08-19 DIAGNOSIS — I110 Hypertensive heart disease with heart failure: Secondary | ICD-10-CM | POA: Diagnosis not present

## 2017-08-19 DIAGNOSIS — M797 Fibromyalgia: Secondary | ICD-10-CM | POA: Diagnosis not present

## 2017-08-19 DIAGNOSIS — G4733 Obstructive sleep apnea (adult) (pediatric): Secondary | ICD-10-CM | POA: Diagnosis not present

## 2017-08-19 NOTE — Progress Notes (Signed)
Home visit because it would be a taxing and considerable effort to come into the doctor's office because of her super obesity, inability to walk 200 feet, dependence of assist devices, and dyspnea with walking short distances.   I have interviewed and examined the patient with Dr Hyman Bible.  I have discussed the case and verified the key findings with Dr. Brett Albino.   I agree with their assessments and plans as documented in their home visit note.  Visit duration 40 minutes.

## 2017-08-20 ENCOUNTER — Telehealth: Payer: Self-pay | Admitting: Internal Medicine

## 2017-08-20 NOTE — Telephone Encounter (Signed)
Returned patient's call. Recommended that patient come into clinic for evaluation because it is very difficult to tell what's going on over the phone. Patient voiced understanding.

## 2017-08-20 NOTE — Telephone Encounter (Signed)
Pt is having trouble with both of her shoulders. It is hard to lift her arms and painful. Right hand is swollen and swelling goes up to elbow.  She also has a rash on her upper arm. She cannot see it but Kathleen Roy said it was a good size red spot.  Feels like a lot little bumps.  Could that be psorirais? Please advise

## 2017-08-21 ENCOUNTER — Telehealth: Payer: Self-pay | Admitting: *Deleted

## 2017-08-21 DIAGNOSIS — J449 Chronic obstructive pulmonary disease, unspecified: Secondary | ICD-10-CM | POA: Diagnosis not present

## 2017-08-21 DIAGNOSIS — I5032 Chronic diastolic (congestive) heart failure: Secondary | ICD-10-CM | POA: Diagnosis not present

## 2017-08-21 DIAGNOSIS — G4733 Obstructive sleep apnea (adult) (pediatric): Secondary | ICD-10-CM | POA: Diagnosis not present

## 2017-08-21 DIAGNOSIS — R7303 Prediabetes: Secondary | ICD-10-CM | POA: Diagnosis not present

## 2017-08-21 DIAGNOSIS — M545 Low back pain: Secondary | ICD-10-CM | POA: Diagnosis not present

## 2017-08-21 DIAGNOSIS — M797 Fibromyalgia: Secondary | ICD-10-CM | POA: Diagnosis not present

## 2017-08-21 DIAGNOSIS — I110 Hypertensive heart disease with heart failure: Secondary | ICD-10-CM | POA: Diagnosis not present

## 2017-08-21 DIAGNOSIS — G9009 Other idiopathic peripheral autonomic neuropathy: Secondary | ICD-10-CM | POA: Diagnosis not present

## 2017-08-21 DIAGNOSIS — M1991 Primary osteoarthritis, unspecified site: Secondary | ICD-10-CM | POA: Diagnosis not present

## 2017-08-21 DIAGNOSIS — E785 Hyperlipidemia, unspecified: Secondary | ICD-10-CM | POA: Diagnosis not present

## 2017-08-21 DIAGNOSIS — G43909 Migraine, unspecified, not intractable, without status migrainosus: Secondary | ICD-10-CM | POA: Diagnosis not present

## 2017-08-21 NOTE — Telephone Encounter (Signed)
Pt wants to know if there is anything she should or shouldn't be doing if she does have blood clot.  Please advise

## 2017-08-21 NOTE — Telephone Encounter (Signed)
Lattie Haw calling from [facility] to request orders for skilled nursing (from 08/06/17 for 4 weeks and one visit as needed).  Will route request to Dr. Brett Albino for approval.  Burna Forts, BSN, RN-BC

## 2017-08-21 NOTE — Telephone Encounter (Signed)
Will forward to MD to advise. Mirna Sutcliffe,CMA  

## 2017-08-21 NOTE — Telephone Encounter (Signed)
Okay to give verbal orders for skilled nursing. Thanks!

## 2017-08-22 ENCOUNTER — Other Ambulatory Visit: Payer: Self-pay

## 2017-08-22 DIAGNOSIS — G9009 Other idiopathic peripheral autonomic neuropathy: Secondary | ICD-10-CM | POA: Diagnosis not present

## 2017-08-22 DIAGNOSIS — E785 Hyperlipidemia, unspecified: Secondary | ICD-10-CM | POA: Diagnosis not present

## 2017-08-22 DIAGNOSIS — M1991 Primary osteoarthritis, unspecified site: Secondary | ICD-10-CM | POA: Diagnosis not present

## 2017-08-22 DIAGNOSIS — M545 Low back pain: Secondary | ICD-10-CM | POA: Diagnosis not present

## 2017-08-22 DIAGNOSIS — R7303 Prediabetes: Secondary | ICD-10-CM | POA: Diagnosis not present

## 2017-08-22 DIAGNOSIS — G4733 Obstructive sleep apnea (adult) (pediatric): Secondary | ICD-10-CM | POA: Diagnosis not present

## 2017-08-22 DIAGNOSIS — I110 Hypertensive heart disease with heart failure: Secondary | ICD-10-CM | POA: Diagnosis not present

## 2017-08-22 DIAGNOSIS — J449 Chronic obstructive pulmonary disease, unspecified: Secondary | ICD-10-CM | POA: Diagnosis not present

## 2017-08-22 DIAGNOSIS — G43909 Migraine, unspecified, not intractable, without status migrainosus: Secondary | ICD-10-CM | POA: Diagnosis not present

## 2017-08-22 DIAGNOSIS — I5032 Chronic diastolic (congestive) heart failure: Secondary | ICD-10-CM | POA: Diagnosis not present

## 2017-08-22 DIAGNOSIS — M797 Fibromyalgia: Secondary | ICD-10-CM | POA: Diagnosis not present

## 2017-08-22 NOTE — Telephone Encounter (Signed)
Pt contacted and informed to keep her arm elevated as much as possible. Pt voiced understanding.

## 2017-08-22 NOTE — Telephone Encounter (Signed)
Returned call to [place] at [place] and left message to call our office back. Burna Forts, BSN, RN-BC

## 2017-08-22 NOTE — Patient Outreach (Incomplete)
[facility]) Care Management  08/22/17  Kathleen Roy 08-Feb-1961 [id number]  Successful outreach completed with patient. Patient identification verified.

## 2017-08-22 NOTE — Telephone Encounter (Signed)
Would recommend that patient keep her arm elevated as much as possible. There is nothing that she shouldn't be doing. Thanks!

## 2017-08-23 DIAGNOSIS — E785 Hyperlipidemia, unspecified: Secondary | ICD-10-CM | POA: Diagnosis not present

## 2017-08-23 DIAGNOSIS — M1991 Primary osteoarthritis, unspecified site: Secondary | ICD-10-CM | POA: Diagnosis not present

## 2017-08-23 DIAGNOSIS — M545 Low back pain: Secondary | ICD-10-CM | POA: Diagnosis not present

## 2017-08-23 DIAGNOSIS — G4733 Obstructive sleep apnea (adult) (pediatric): Secondary | ICD-10-CM | POA: Diagnosis not present

## 2017-08-23 DIAGNOSIS — R7303 Prediabetes: Secondary | ICD-10-CM | POA: Diagnosis not present

## 2017-08-23 DIAGNOSIS — I5032 Chronic diastolic (congestive) heart failure: Secondary | ICD-10-CM | POA: Diagnosis not present

## 2017-08-23 DIAGNOSIS — G9009 Other idiopathic peripheral autonomic neuropathy: Secondary | ICD-10-CM | POA: Diagnosis not present

## 2017-08-23 DIAGNOSIS — J449 Chronic obstructive pulmonary disease, unspecified: Secondary | ICD-10-CM | POA: Diagnosis not present

## 2017-08-23 DIAGNOSIS — G43909 Migraine, unspecified, not intractable, without status migrainosus: Secondary | ICD-10-CM | POA: Diagnosis not present

## 2017-08-23 DIAGNOSIS — M797 Fibromyalgia: Secondary | ICD-10-CM | POA: Diagnosis not present

## 2017-08-23 DIAGNOSIS — I110 Hypertensive heart disease with heart failure: Secondary | ICD-10-CM | POA: Diagnosis not present

## 2017-08-25 ENCOUNTER — Encounter: Payer: Self-pay | Admitting: *Deleted

## 2017-08-25 ENCOUNTER — Other Ambulatory Visit: Payer: Self-pay | Admitting: *Deleted

## 2017-08-25 DIAGNOSIS — I5032 Chronic diastolic (congestive) heart failure: Secondary | ICD-10-CM | POA: Diagnosis not present

## 2017-08-25 DIAGNOSIS — M1991 Primary osteoarthritis, unspecified site: Secondary | ICD-10-CM | POA: Diagnosis not present

## 2017-08-25 DIAGNOSIS — J449 Chronic obstructive pulmonary disease, unspecified: Secondary | ICD-10-CM | POA: Diagnosis not present

## 2017-08-25 DIAGNOSIS — M797 Fibromyalgia: Secondary | ICD-10-CM | POA: Diagnosis not present

## 2017-08-25 DIAGNOSIS — G9009 Other idiopathic peripheral autonomic neuropathy: Secondary | ICD-10-CM | POA: Diagnosis not present

## 2017-08-25 DIAGNOSIS — I110 Hypertensive heart disease with heart failure: Secondary | ICD-10-CM | POA: Diagnosis not present

## 2017-08-25 DIAGNOSIS — G4733 Obstructive sleep apnea (adult) (pediatric): Secondary | ICD-10-CM | POA: Diagnosis not present

## 2017-08-25 DIAGNOSIS — G43909 Migraine, unspecified, not intractable, without status migrainosus: Secondary | ICD-10-CM | POA: Diagnosis not present

## 2017-08-25 DIAGNOSIS — R7303 Prediabetes: Secondary | ICD-10-CM | POA: Diagnosis not present

## 2017-08-25 DIAGNOSIS — E785 Hyperlipidemia, unspecified: Secondary | ICD-10-CM | POA: Diagnosis not present

## 2017-08-25 DIAGNOSIS — M545 Low back pain: Secondary | ICD-10-CM | POA: Diagnosis not present

## 2017-08-25 NOTE — Patient Outreach (Addendum)
[facility]) Care Management  08/25/2017  SHIRLEYMAE HAUTH September 04, 1961 [id number]  CSW was able to make initial contact with patient and patient's brother, Orva Riles today to perform the phone assessment, as well as assess and assist with social work needs and services.  CSW introduced self, explained role and types of services provided through [facility] ([facility]).  CSW further explained to patient that CSW works with patient's RNCM, also with [facility] [facility], Tish Men. CSW then explained the reason for the call, indicating that Mrs. Satterfield thought that patient would benefit from social work services and resources to assist with transportation to and from two upcoming physician appointments.  CSW obtained two HIPAA compliant identifiers from patient, which included patient's name and date of birth. CSW received a HIPAA compliant voicemail message from [place], patient's RNCM with [facility], indicating that patient is in need of transportation to upcoming physician appointments this week.  Patient admitted to Mrs. Satterfield that she does not have transportation arranged and will need to cancel appointments if arrangements cannot be made through [employer].  CSW has placed the following request to Freda Jackson, Care Management Assistant with [facility]: Tuesday, September 11th at 3:30pm with Dr. Hyman Bible, [facility] ([address], [place], [place]) Wednesday, September 12th at 2:00pm with Dr. Mirna Mires at [facility] ([place], [place], [place]) Patient has been approved for transportation assistance through [facility] [employer], as patient is wheelchair bound and will require non-emergency ambulance transport.  Mr. Evelene Croon will make  transportation arrangements and then CSW will confirm these arrangements with patient and Mr. Chauncey. Patient admits that she is currently paying over $500.00 per transport through [facility] which they simply cannot afford, being on a very fixed income.  CSW encouraged patient and Mr. Cutsforth to contact CSW directly when transportation is needed to physician appointments so that [place] can make arrangements through [facility] [employer], at the expense of [place] [facility].  Patient voiced understanding and was agreeable to this plan. CSW will perform a case closure on patient, as all goals of treatment have been met from social work standpoint and no additional social work needs have been identified at this time.  CSW will notify patient's RNCM with [facility], Tish Men of CSW's plans to close patient's case.  CSW will fax an update to patient's Primary Care Physician, Dr. Hyman Bible to ensure that they are aware of CSW's involvement with patient's plan of care.  CSW will submit a case closure request to Verlon Setting, Care Management Assistant with [facility], in the form of an [employer].  CSW will ensure that Mrs. Comer is aware of Mrs. Satterfield's, RNCM with [facility] [employer], continued involvement with patient's care.  Addendum: CSW received a call from Mr. Evelene Croon, indicating that he is unable to arrange transportation for patient to either of the above appointments, as [facility] needs at least three days notice to arrange for ambulance transport.  CSW has notified Mrs. Satterfield, as well as patient and Mr. Sienkiewicz, agreeing to also call Dr. Brett Albino and Dr. Mirna Mires to try and reschedule patient's appointments for the next earliest availability.   * Patient's appointment with Dr. Brett Albino has been rescheduled for Tuesday,  September  25th at 2:30pm.  Transportation arrangements have been made for this appointment through [employer], at the expense of [facility].  CSW is still awaiting a return call from the [facility] with regards to a new appointment time and date for patient.  CSW will follow-up with a note in patient's chart  * Patient's appointment with Dr. Toy Cookey has been rescheduled for Monday, October 1st at 3:45pm.  Transportation arrangements have been made through [employer], at the expense of [facility].  [name], [name], [name], [name]  Licensed Education officer, [facility]  Mailing [place] [address] [place], [place] [address] Physical Address-[address], [place], [place] [address] Toll Free Main # [phone] Fax # [phone] Cell # [phone]  Office # [phone] [name]

## 2017-08-26 ENCOUNTER — Ambulatory Visit: Payer: Medicare Other | Admitting: Internal Medicine

## 2017-08-26 DIAGNOSIS — J9611 Chronic respiratory failure with hypoxia: Secondary | ICD-10-CM | POA: Diagnosis not present

## 2017-08-27 ENCOUNTER — Telehealth: Payer: Self-pay | Admitting: *Deleted

## 2017-08-27 DIAGNOSIS — G43909 Migraine, unspecified, not intractable, without status migrainosus: Secondary | ICD-10-CM | POA: Diagnosis not present

## 2017-08-27 DIAGNOSIS — M1991 Primary osteoarthritis, unspecified site: Secondary | ICD-10-CM | POA: Diagnosis not present

## 2017-08-27 DIAGNOSIS — G4733 Obstructive sleep apnea (adult) (pediatric): Secondary | ICD-10-CM | POA: Diagnosis not present

## 2017-08-27 DIAGNOSIS — J449 Chronic obstructive pulmonary disease, unspecified: Secondary | ICD-10-CM | POA: Diagnosis not present

## 2017-08-27 DIAGNOSIS — E785 Hyperlipidemia, unspecified: Secondary | ICD-10-CM | POA: Diagnosis not present

## 2017-08-27 DIAGNOSIS — M797 Fibromyalgia: Secondary | ICD-10-CM | POA: Diagnosis not present

## 2017-08-27 DIAGNOSIS — I110 Hypertensive heart disease with heart failure: Secondary | ICD-10-CM | POA: Diagnosis not present

## 2017-08-27 DIAGNOSIS — I5032 Chronic diastolic (congestive) heart failure: Secondary | ICD-10-CM | POA: Diagnosis not present

## 2017-08-27 DIAGNOSIS — G9009 Other idiopathic peripheral autonomic neuropathy: Secondary | ICD-10-CM | POA: Diagnosis not present

## 2017-08-27 DIAGNOSIS — M545 Low back pain: Secondary | ICD-10-CM | POA: Diagnosis not present

## 2017-08-27 DIAGNOSIS — R7303 Prediabetes: Secondary | ICD-10-CM | POA: Diagnosis not present

## 2017-08-27 NOTE — Telephone Encounter (Signed)
Izora Gala, OT with [facility] at [facility] left message on nurse line to request verbal order for OT services.  Pt was evaluated last week. Request for once a week for 4 weeks to increase flexibility, decrease pain and edema.  Left Izora Gala a voice message regarding verbal order ok to continue with OT services as requested.  Derl Barrow, RN

## 2017-08-27 NOTE — Telephone Encounter (Signed)
You welcome. Derl Barrow, RN

## 2017-08-27 NOTE — Telephone Encounter (Signed)
Thank you Tamika!

## 2017-08-28 DIAGNOSIS — M797 Fibromyalgia: Secondary | ICD-10-CM | POA: Diagnosis not present

## 2017-08-28 DIAGNOSIS — E785 Hyperlipidemia, unspecified: Secondary | ICD-10-CM | POA: Diagnosis not present

## 2017-08-28 DIAGNOSIS — M1991 Primary osteoarthritis, unspecified site: Secondary | ICD-10-CM | POA: Diagnosis not present

## 2017-08-28 DIAGNOSIS — M545 Low back pain: Secondary | ICD-10-CM | POA: Diagnosis not present

## 2017-08-28 DIAGNOSIS — I5032 Chronic diastolic (congestive) heart failure: Secondary | ICD-10-CM | POA: Diagnosis not present

## 2017-08-28 DIAGNOSIS — R7303 Prediabetes: Secondary | ICD-10-CM | POA: Diagnosis not present

## 2017-08-28 DIAGNOSIS — G43909 Migraine, unspecified, not intractable, without status migrainosus: Secondary | ICD-10-CM | POA: Diagnosis not present

## 2017-08-28 DIAGNOSIS — I110 Hypertensive heart disease with heart failure: Secondary | ICD-10-CM | POA: Diagnosis not present

## 2017-08-28 DIAGNOSIS — J449 Chronic obstructive pulmonary disease, unspecified: Secondary | ICD-10-CM | POA: Diagnosis not present

## 2017-08-28 DIAGNOSIS — G4733 Obstructive sleep apnea (adult) (pediatric): Secondary | ICD-10-CM | POA: Diagnosis not present

## 2017-08-28 DIAGNOSIS — G9009 Other idiopathic peripheral autonomic neuropathy: Secondary | ICD-10-CM | POA: Diagnosis not present

## 2017-08-29 ENCOUNTER — Other Ambulatory Visit: Payer: Self-pay
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
## 2017-09-01 DIAGNOSIS — M797 Fibromyalgia: Secondary | ICD-10-CM | POA: Diagnosis not present

## 2017-09-01 DIAGNOSIS — I110 Hypertensive heart disease with heart failure: Secondary | ICD-10-CM | POA: Diagnosis not present

## 2017-09-01 DIAGNOSIS — G4733 Obstructive sleep apnea (adult) (pediatric): Secondary | ICD-10-CM | POA: Diagnosis not present

## 2017-09-01 DIAGNOSIS — J449 Chronic obstructive pulmonary disease, unspecified: Secondary | ICD-10-CM | POA: Diagnosis not present

## 2017-09-01 DIAGNOSIS — G43909 Migraine, unspecified, not intractable, without status migrainosus: Secondary | ICD-10-CM | POA: Diagnosis not present

## 2017-09-01 DIAGNOSIS — I5032 Chronic diastolic (congestive) heart failure: Secondary | ICD-10-CM | POA: Diagnosis not present

## 2017-09-01 DIAGNOSIS — M1991 Primary osteoarthritis, unspecified site: Secondary | ICD-10-CM | POA: Diagnosis not present

## 2017-09-01 DIAGNOSIS — G9009 Other idiopathic peripheral autonomic neuropathy: Secondary | ICD-10-CM | POA: Diagnosis not present

## 2017-09-01 DIAGNOSIS — E785 Hyperlipidemia, unspecified: Secondary | ICD-10-CM | POA: Diagnosis not present

## 2017-09-01 DIAGNOSIS — R7303 Prediabetes: Secondary | ICD-10-CM | POA: Diagnosis not present

## 2017-09-01 DIAGNOSIS — M545 Low back pain: Secondary | ICD-10-CM | POA: Diagnosis not present

## 2017-09-02 ENCOUNTER — Ambulatory Visit: Payer: Self-pay

## 2017-09-02 ENCOUNTER — Ambulatory Visit: Payer: Medicare Other | Admitting: Internal Medicine

## 2017-09-05 ENCOUNTER — Other Ambulatory Visit: Payer: Self-pay

## 2017-09-05 DIAGNOSIS — G9009 Other idiopathic peripheral autonomic neuropathy: Secondary | ICD-10-CM | POA: Diagnosis not present

## 2017-09-05 DIAGNOSIS — M545 Low back pain: Secondary | ICD-10-CM | POA: Diagnosis not present

## 2017-09-05 DIAGNOSIS — R7303 Prediabetes: Secondary | ICD-10-CM | POA: Diagnosis not present

## 2017-09-05 DIAGNOSIS — G4733 Obstructive sleep apnea (adult) (pediatric): Secondary | ICD-10-CM | POA: Diagnosis not present

## 2017-09-05 DIAGNOSIS — E785 Hyperlipidemia, unspecified: Secondary | ICD-10-CM | POA: Diagnosis not present

## 2017-09-05 DIAGNOSIS — M797 Fibromyalgia: Secondary | ICD-10-CM | POA: Diagnosis not present

## 2017-09-05 DIAGNOSIS — I5032 Chronic diastolic (congestive) heart failure: Secondary | ICD-10-CM | POA: Diagnosis not present

## 2017-09-05 DIAGNOSIS — I110 Hypertensive heart disease with heart failure: Secondary | ICD-10-CM | POA: Diagnosis not present

## 2017-09-05 DIAGNOSIS — G43909 Migraine, unspecified, not intractable, without status migrainosus: Secondary | ICD-10-CM | POA: Diagnosis not present

## 2017-09-05 DIAGNOSIS — M1991 Primary osteoarthritis, unspecified site: Secondary | ICD-10-CM | POA: Diagnosis not present

## 2017-09-05 DIAGNOSIS — J449 Chronic obstructive pulmonary disease, unspecified: Secondary | ICD-10-CM | POA: Diagnosis not present

## 2017-09-05 NOTE — Patient Outreach (Signed)
[facility]) Care Management  09/05/17  Kathleen Roy [birth year] [id number]  Successful outreach completed with patient. Patient identification verified.  Patient stated that she has been having accidents this week, both urinary and bowel incontinence. She stated that urinary incontinence is not new and has happened before, but the bowel incontinence is new for her. She stated that her bowel movements have been soft and "almost runny." She stated that it has been challenging for her brother to get her cleaned up, but today she got a good bath with her in-home aide. However, she will be losing her aide next week as her last OT visit will be last week. She is concerned that she will no longer have the aide as it has been very helpful to have someone bathe her. She stated she just never knows what she will be dealing with when standing because of her pain and her brother does not always understand why she cannot stay standing.  Plan: RNCM to follow up regarding transportation arrangements next week at her request for her PCP appointment on 9/25 and home visit also scheduled for next week.  [place] R. Gayl Ivanoff, RN, BSN, [place] Management Coordinator [phone]

## 2017-09-08 DIAGNOSIS — R7303 Prediabetes: Secondary | ICD-10-CM | POA: Diagnosis not present

## 2017-09-08 DIAGNOSIS — I5032 Chronic diastolic (congestive) heart failure: Secondary | ICD-10-CM | POA: Diagnosis not present

## 2017-09-08 DIAGNOSIS — J449 Chronic obstructive pulmonary disease, unspecified: Secondary | ICD-10-CM | POA: Diagnosis not present

## 2017-09-08 DIAGNOSIS — G4733 Obstructive sleep apnea (adult) (pediatric): Secondary | ICD-10-CM | POA: Diagnosis not present

## 2017-09-08 DIAGNOSIS — M1991 Primary osteoarthritis, unspecified site: Secondary | ICD-10-CM | POA: Diagnosis not present

## 2017-09-08 DIAGNOSIS — M545 Low back pain: Secondary | ICD-10-CM | POA: Diagnosis not present

## 2017-09-08 DIAGNOSIS — I110 Hypertensive heart disease with heart failure: Secondary | ICD-10-CM | POA: Diagnosis not present

## 2017-09-08 DIAGNOSIS — E785 Hyperlipidemia, unspecified: Secondary | ICD-10-CM | POA: Diagnosis not present

## 2017-09-08 DIAGNOSIS — M797 Fibromyalgia: Secondary | ICD-10-CM | POA: Diagnosis not present

## 2017-09-08 DIAGNOSIS — G43909 Migraine, unspecified, not intractable, without status migrainosus: Secondary | ICD-10-CM | POA: Diagnosis not present

## 2017-09-08 DIAGNOSIS — G9009 Other idiopathic peripheral autonomic neuropathy: Secondary | ICD-10-CM | POA: Diagnosis not present

## 2017-09-09 ENCOUNTER — Ambulatory Visit: Payer: Medicare Other | Admitting: Internal Medicine

## 2017-09-10 ENCOUNTER — Telehealth: Payer: Self-pay | Admitting: Internal Medicine

## 2017-09-10 NOTE — Telephone Encounter (Signed)
Pt has been buying zyrtec over the counter but it is getting too expensive. she would like to have a RX called in for that. She is also wanting a referral to [facility] for home health care-bathing, simple tasks. On the referral, she will need to put how long she would need this care.

## 2017-09-11 ENCOUNTER — Ambulatory Visit: Payer: Self-pay

## 2017-09-11 ENCOUNTER — Other Ambulatory Visit: Payer: Self-pay | Admitting: Internal Medicine

## 2017-09-11 DIAGNOSIS — Z741 Need for assistance with personal care: Secondary | ICD-10-CM

## 2017-09-11 NOTE — Telephone Encounter (Signed)
Spoke with patient and she is aware of this.  Will send this message to Lauren to let her know that patient would like her aide from [facility]. [name],CMA

## 2017-09-11 NOTE — Telephone Encounter (Signed)
Patient called back and states that zyrtec wasn't at the pharmacy.  Medication called in and they will go ahead and fill for patient. [name],CMA

## 2017-09-11 NOTE — Telephone Encounter (Signed)
Please let Ms. Sosa know that the Zyrtec prescription was called into her pharmacy on 8/31 with 90 tablets and 2 refills. I have placed an ordered for a home health aide. I will ask Lauren to help make sure this is set up. Thanks!

## 2017-09-12 ENCOUNTER — Other Ambulatory Visit: Payer: Self-pay | Admitting: Internal Medicine

## 2017-09-12 DIAGNOSIS — J449 Chronic obstructive pulmonary disease, unspecified: Secondary | ICD-10-CM | POA: Diagnosis not present

## 2017-09-12 DIAGNOSIS — G9009 Other idiopathic peripheral autonomic neuropathy: Secondary | ICD-10-CM | POA: Diagnosis not present

## 2017-09-12 DIAGNOSIS — E785 Hyperlipidemia, unspecified: Secondary | ICD-10-CM | POA: Diagnosis not present

## 2017-09-12 DIAGNOSIS — G43909 Migraine, unspecified, not intractable, without status migrainosus: Secondary | ICD-10-CM | POA: Diagnosis not present

## 2017-09-12 DIAGNOSIS — M1991 Primary osteoarthritis, unspecified site: Secondary | ICD-10-CM | POA: Diagnosis not present

## 2017-09-12 DIAGNOSIS — M545 Low back pain: Secondary | ICD-10-CM | POA: Diagnosis not present

## 2017-09-12 DIAGNOSIS — G4733 Obstructive sleep apnea (adult) (pediatric): Secondary | ICD-10-CM | POA: Diagnosis not present

## 2017-09-12 DIAGNOSIS — I5032 Chronic diastolic (congestive) heart failure: Secondary | ICD-10-CM | POA: Diagnosis not present

## 2017-09-12 DIAGNOSIS — M797 Fibromyalgia: Secondary | ICD-10-CM | POA: Diagnosis not present

## 2017-09-12 DIAGNOSIS — I110 Hypertensive heart disease with heart failure: Secondary | ICD-10-CM | POA: Diagnosis not present

## 2017-09-12 DIAGNOSIS — R7303 Prediabetes: Secondary | ICD-10-CM | POA: Diagnosis not present

## 2017-09-12 DIAGNOSIS — Z741 Need for assistance with personal care: Secondary | ICD-10-CM

## 2017-09-16 NOTE — Telephone Encounter (Signed)
Pt states she was under home health with [facility] until Friday. She is concerned someone may have tried to initiate home health with [facility] on Friday and it was denied. Please advise

## 2017-09-17 NOTE — Telephone Encounter (Signed)
Have been working with [facility] to get patient PT in addition to [facility]. Patient should be hearing from [facility] soon.  Hubbard Hartshorn, RN, BSN

## 2017-09-18 ENCOUNTER — Ambulatory Visit: Payer: Medicare Other | Admitting: Internal Medicine

## 2017-09-19 ENCOUNTER — Encounter (HOSPITAL_COMMUNITY): Payer: Self-pay

## 2017-09-19 ENCOUNTER — Inpatient Hospital Stay (HOSPITAL_COMMUNITY)
Admission: EM | Admit: 2017-09-19 | Discharge: 2017-09-22 | DRG: 189 | Disposition: A | Discharge status: Home Health | Payer: Medicare Other | Attending: Family Medicine | Admitting: Family Medicine

## 2017-09-19 ENCOUNTER — Emergency Department (HOSPITAL_COMMUNITY): Payer: Medicare Other

## 2017-09-19 DIAGNOSIS — I110 Hypertensive heart disease with heart failure: Secondary | ICD-10-CM | POA: Diagnosis not present

## 2017-09-19 DIAGNOSIS — Z9119 Patient's noncompliance with other medical treatment and regimen: Secondary | ICD-10-CM | POA: Diagnosis not present

## 2017-09-19 DIAGNOSIS — I5032 Chronic diastolic (congestive) heart failure: Secondary | ICD-10-CM | POA: Diagnosis not present

## 2017-09-19 DIAGNOSIS — E785 Hyperlipidemia, unspecified: Secondary | ICD-10-CM | POA: Diagnosis present

## 2017-09-19 DIAGNOSIS — J9602 Acute respiratory failure with hypercapnia: Secondary | ICD-10-CM | POA: Diagnosis not present

## 2017-09-19 DIAGNOSIS — Z79899 Other long term (current) drug therapy: Secondary | ICD-10-CM | POA: Diagnosis not present

## 2017-09-19 DIAGNOSIS — Z66 Do not resuscitate: Secondary | ICD-10-CM | POA: Diagnosis present

## 2017-09-19 DIAGNOSIS — M797 Fibromyalgia: Secondary | ICD-10-CM | POA: Diagnosis present

## 2017-09-19 DIAGNOSIS — Z23 Encounter for immunization: Secondary | ICD-10-CM

## 2017-09-19 DIAGNOSIS — M545 Low back pain: Secondary | ICD-10-CM | POA: Diagnosis not present

## 2017-09-19 DIAGNOSIS — R4182 Altered mental status, unspecified: Secondary | ICD-10-CM | POA: Diagnosis not present

## 2017-09-19 DIAGNOSIS — Z6841 Body Mass Index (BMI) 40.0 and over, adult: Secondary | ICD-10-CM

## 2017-09-19 DIAGNOSIS — E872 Acidosis: Secondary | ICD-10-CM | POA: Diagnosis present

## 2017-09-19 DIAGNOSIS — J9622 Acute and chronic respiratory failure with hypercapnia: Secondary | ICD-10-CM | POA: Diagnosis present

## 2017-09-19 DIAGNOSIS — Z7951 Long term (current) use of inhaled steroids: Secondary | ICD-10-CM

## 2017-09-19 DIAGNOSIS — B372 Candidiasis of skin and nail: Secondary | ICD-10-CM | POA: Diagnosis not present

## 2017-09-19 DIAGNOSIS — Z87891 Personal history of nicotine dependence: Secondary | ICD-10-CM

## 2017-09-19 DIAGNOSIS — R918 Other nonspecific abnormal finding of lung field: Secondary | ICD-10-CM | POA: Diagnosis not present

## 2017-09-19 DIAGNOSIS — R402441 Other coma, without documented Glasgow coma scale score, or with partial score reported, in the field [EMT or ambulance]: Secondary | ICD-10-CM | POA: Diagnosis not present

## 2017-09-19 DIAGNOSIS — E1142 Type 2 diabetes mellitus with diabetic polyneuropathy: Secondary | ICD-10-CM | POA: Diagnosis not present

## 2017-09-19 DIAGNOSIS — K5903 Drug induced constipation: Secondary | ICD-10-CM | POA: Diagnosis not present

## 2017-09-19 DIAGNOSIS — J449 Chronic obstructive pulmonary disease, unspecified: Secondary | ICD-10-CM | POA: Diagnosis not present

## 2017-09-19 DIAGNOSIS — F339 Major depressive disorder, recurrent, unspecified: Secondary | ICD-10-CM | POA: Diagnosis present

## 2017-09-19 DIAGNOSIS — Z9981 Dependence on supplemental oxygen: Secondary | ICD-10-CM | POA: Diagnosis not present

## 2017-09-19 DIAGNOSIS — M25561 Pain in right knee: Secondary | ICD-10-CM | POA: Diagnosis present

## 2017-09-19 DIAGNOSIS — D649 Anemia, unspecified: Secondary | ICD-10-CM | POA: Diagnosis present

## 2017-09-19 DIAGNOSIS — J9621 Acute and chronic respiratory failure with hypoxia: Principal | ICD-10-CM | POA: Diagnosis present

## 2017-09-19 DIAGNOSIS — E662 Morbid (severe) obesity with alveolar hypoventilation: Secondary | ICD-10-CM | POA: Diagnosis present

## 2017-09-19 DIAGNOSIS — G4700 Insomnia, unspecified: Secondary | ICD-10-CM | POA: Diagnosis present

## 2017-09-19 DIAGNOSIS — M25562 Pain in left knee: Secondary | ICD-10-CM | POA: Diagnosis present

## 2017-09-19 DIAGNOSIS — R400 Somnolence: Secondary | ICD-10-CM | POA: Diagnosis not present

## 2017-09-19 DIAGNOSIS — G8929 Other chronic pain: Secondary | ICD-10-CM | POA: Diagnosis present

## 2017-09-19 DIAGNOSIS — R404 Transient alteration of awareness: Secondary | ICD-10-CM | POA: Diagnosis not present

## 2017-09-19 LAB — CBC WITH DIFFERENTIAL/PLATELET
BASOS ABS: 0 10*3/uL (ref 0.0–0.1)
Basophils Relative: 0 %
EOS ABS: 0 10*3/uL (ref 0.0–0.7)
Eosinophils Relative: 0 %
HCT: 37 % (ref 36.0–46.0)
HEMOGLOBIN: 10.4 g/dL — AB (ref 12.0–15.0)
LYMPHS ABS: 0.5 10*3/uL — AB (ref 0.7–4.0)
LYMPHS PCT: 8 %
MCH: 28.3 pg (ref 26.0–34.0)
MCHC: 28.1 g/dL — ABNORMAL LOW (ref 30.0–36.0)
MCV: 100.5 fL — AB (ref 78.0–100.0)
Monocytes Absolute: 0.3 10*3/uL (ref 0.1–1.0)
Monocytes Relative: 5 %
NEUTROS PCT: 87 %
Neutro Abs: 5.7 10*3/uL (ref 1.7–7.7)
Platelets: 159 10*3/uL (ref 150–400)
RBC: 3.68 MIL/uL — AB (ref 3.87–5.11)
RDW: 16.5 % — ABNORMAL HIGH (ref 11.5–15.5)
WBC: 6.6 10*3/uL (ref 4.0–10.5)

## 2017-09-19 LAB — URINALYSIS, ROUTINE W REFLEX MICROSCOPIC
Bilirubin Urine: NEGATIVE
Glucose, UA: NEGATIVE mg/dL
HGB URINE DIPSTICK: NEGATIVE
Ketones, ur: NEGATIVE mg/dL
LEUKOCYTES UA: NEGATIVE
Nitrite: NEGATIVE
Protein, ur: NEGATIVE mg/dL
SPECIFIC GRAVITY, URINE: 1.013 (ref 1.005–1.030)
pH: 6 (ref 5.0–8.0)

## 2017-09-19 LAB — COMPREHENSIVE METABOLIC PANEL
ALT: 13 U/L — ABNORMAL LOW (ref 14–54)
AST: 15 U/L (ref 15–41)
Albumin: 3.2 g/dL — ABNORMAL LOW (ref 3.5–5.0)
Alkaline Phosphatase: 120 U/L (ref 38–126)
Anion gap: 6 (ref 5–15)
BUN: 10 mg/dL (ref 6–20)
CHLORIDE: 94 mmol/L — AB (ref 101–111)
CO2: 42 mmol/L — ABNORMAL HIGH (ref 22–32)
Calcium: 9.1 mg/dL (ref 8.9–10.3)
Creatinine, Ser: 0.79 mg/dL (ref 0.44–1.00)
Glucose, Bld: 127 mg/dL — ABNORMAL HIGH (ref 65–99)
POTASSIUM: 5.1 mmol/L (ref 3.5–5.1)
Sodium: 142 mmol/L (ref 135–145)
Total Bilirubin: 0.6 mg/dL (ref 0.3–1.2)
Total Protein: 7.3 g/dL (ref 6.5–8.1)

## 2017-09-19 LAB — RAPID URINE DRUG SCREEN, HOSP PERFORMED
Amphetamines: NOT DETECTED
BARBITURATES: NOT DETECTED
Benzodiazepines: NOT DETECTED
Cocaine: NOT DETECTED
Opiates: NOT DETECTED
Tetrahydrocannabinol: NOT DETECTED

## 2017-09-19 LAB — BLOOD GAS, ARTERIAL
Acid-Base Excess: 15.2 mmol/L — ABNORMAL HIGH (ref 0.0–2.0)
Bicarbonate: 42.5 mmol/L — ABNORMAL HIGH (ref 20.0–28.0)
O2 Content: 4 L/min
O2 SAT: 95.6 %
PCO2 ART: 89 mmHg — AB (ref 32.0–48.0)
PH ART: 7.293 — AB (ref 7.350–7.450)
Patient temperature: 96.6
pO2, Arterial: 75.7 mmHg — ABNORMAL LOW (ref 83.0–108.0)

## 2017-09-19 LAB — SALICYLATE LEVEL

## 2017-09-19 LAB — I-STAT CG4 LACTIC ACID, ED: LACTIC ACID, VENOUS: 1.59 mmol/L (ref 0.5–1.9)

## 2017-09-19 LAB — I-STAT TROPONIN, ED: TROPONIN I, POC: 0 ng/mL (ref 0.00–0.08)

## 2017-09-19 LAB — CBG MONITORING, ED: GLUCOSE-CAPILLARY: 117 mg/dL — AB (ref 65–99)

## 2017-09-19 LAB — AMMONIA: AMMONIA: 44 umol/L — AB (ref 9–35)

## 2017-09-19 LAB — ACETAMINOPHEN LEVEL

## 2017-09-19 MED ORDER — DEXTROSE 5 % IV SOLN
500.0000 mg | Freq: Once | INTRAVENOUS | Status: AC
  Administered 2017-09-19: 500 mg via INTRAVENOUS
  Filled 2017-09-19: qty 500

## 2017-09-19 MED ORDER — NALOXONE HCL 0.4 MG/ML IJ SOLN
0.4000 mg | Freq: Once | INTRAMUSCULAR | Status: AC
Start: 2017-09-19 — End: 2017-09-19
  Administered 2017-09-19: 0.4 mg via INTRAVENOUS
  Filled 2017-09-19: qty 1

## 2017-09-19 MED ORDER — CEFTRIAXONE SODIUM 1 G IJ SOLR
1.0000 g | Freq: Once | INTRAMUSCULAR | Status: AC
  Administered 2017-09-19: 1 g via INTRAVENOUS
  Filled 2017-09-19: qty 10

## 2017-09-19 NOTE — ED Provider Notes (Signed)
[facility] DEPT Provider Note   CSN: [id number] Arrival date & time: 09/19/17  1940     History   Chief Complaint Chief Complaint  Patient presents with  . Altered Mental Status  . Tremors    HPI BAUDELIA SCHROEPFER is a 56 y.o. female.  56 year old female history of morbid obesity, diabetes, COPD, fibromyalgia, HTN, chronic back pain who presents via EMS for somnolence and altered mental status.  Patient lives at home and is cared for by brother.  She normally sleeps in a recliner.  Patient was unarousable by brother throughout the day.  Brother states that patient slept nearly 16 hours and was not responsive despite him slapping her.  Patient is noted to have Opana on medication list, though this was recently discontinued.  Brother states patient only takes Percocet and Flexeril.  He does not believe patient has taken more than usual. She normally is able to stand without assistance and use bedside commode before returning to her recliner. No known recent infectious sx.   The history is provided by the EMS personnel, a relative and medical records. No language interpreter was used.    Past Medical History:  Diagnosis Date  . Arthritis    "I feel like it's everywhere" (11/08/2014)  . Cellulitis and abscess of lower extremity 08/16/2016   right leg  . Chronic lower back pain   . COPD (chronic obstructive pulmonary disease) ([place])   . Depression   . Diabetes mellitus without complication ([place])    type 2   . Fibromyalgia   . Headache    "at least weekly; it's usually when I first wake up" (11/08/2014)  . Hyperlipidemia   . Hypertension   . HYPERTENSION, BENIGN SYSTEMIC 02/12/2007  . Memory disturbance   . Migraines 1970's - <2000   "they just went away"  . Neuropathy   . Normal echocardiogram 05/30/05   suboptimal study  . Obesity, morbid (more than 100 lbs over ideal weight or BMI > 40) (HCC)    obese since childhood  . Osteoarthritis   . Peripheral edema   . Pressure  ulcer of foot    left  . Sepsis ([place])   . Shortness of breath dyspnea   . Sleep apnea    "recently dx'd; haven't got my equipment yet" (11/08/2014)    Patient Active Problem List   Diagnosis Date Noted  . Altered mental status 09/20/2017  . 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 Allergic rhinitis 08/15/2017  . Immobility 08/15/2017  . Fibromyalgia 06/16/2017  . Chronic pain of both knees   . Acute respiratory failure with hypercapnia ([place]) 10/15/2016  . Type 2 diabetes mellitus ([place]) 02/14/2016  . Chronic diastolic congestive heart failure ([place])   . Essential hypertension   . OSA (obstructive sleep apnea)   . Obesity hypoventilation syndrome ([place])   . Chronic respiratory failure with hypoxia ([place])   . Memory loss 03/18/2014  . Venous stasis dermatitis of both lower extremities 12/02/2012  . COPD (chronic obstructive pulmonary disease) ([place]) 10/15/2011  . Peripheral neuropathy ([place]) 10/15/2011  . BACK PAIN, LUMBAR, CHRONIC 01/22/2011  . Morbid obesity ([place]) 11/16/2007  . Major depressive disorder, recurrent episode ([place]) 02/12/2007    Past Surgical History:  Procedure Laterality Date  . ABDOMINAL HERNIA REPAIR  2002  . APPENDECTOMY  1995  . CHOLECYSTECTOMY  1995  . TOTAL ABDOMINAL HYSTERECTOMY  1994    OB History    Gravida Para Term Preterm AB Living   0 0 0 0 0  0   SAB TAB Ectopic Multiple Live Births   0 0 0 0         Home Medications    Prior to Admission medications   Medication Sig Start Date End Date Taking? Authorizing Provider  albuterol (PROAIR HFA) 108 (90 Base) MCG/ACT inhaler Inhale 2 puffs into the lungs every 6 (six) hours as needed for wheezing or shortness of breath.   Yes [provider]  ARIPiprazole (ABILIFY) 2 MG tablet Take 1 tablet (2 mg total) by mouth every other day. 12/18/16  Yes Mayo, Pete Pelt, MD  atorvastatin (LIPITOR) 10 MG tablet Take 1 tablet (10 mg total) by mouth at bedtime. 03/27/17  Yes Mayo, Pete Pelt, MD  cyclobenzaprine (FLEXERIL)  10 MG tablet Take 1 tablet (10 mg total) by mouth 3 (three) times daily as needed for muscle spasms. 12/18/16  Yes Mayo, Pete Pelt, MD  fluticasone furoate-vilanterol (BREO ELLIPTA) 100-25 MCG/INH AEPB Inhale 1 puff into the lungs daily.   Yes [provider]  Fluticasone-Salmeterol (ADVAIR) 100-50 MCG/DOSE AEPB Inhale 1 puff into the lungs 2 (two) times daily.   Yes [provider]  naloxone (NARCAN) nasal spray 4 mg/0.1 mL Place 1 spray into the nose once as needed (opiod overdose).   Yes [provider]  nystatin (NYSTATIN) powder Apply topically See admin instructions. Apply topically to affected area as needed for rash/itching   Yes [provider]  OXYGEN Inhale 4 L into the lungs continuous.    Yes [provider]  oxymorphone (OPANA ER) 7.5 MG TB12 12 hr tablet Take 1 tablet (7.5 mg total) by mouth every 12 (twelve) hours. Patient taking differently: Take 7.5 mg by mouth every 8 (eight) hours.  07/28/17  Yes Gerlene Fee, NP  potassium chloride SA (K-DUR,KLOR-CON) 20 MEQ tablet Take 2 tablets once a day. Patient taking differently: Take 40 mEq by mouth daily.  03/04/17  Yes Mayo, Pete Pelt, MD  pregabalin (LYRICA) 150 MG capsule Take 1 capsule (150 mg total) by mouth 2 (two) times daily. 07/28/17  Yes Gerlene Fee, NP  torsemide (DEMADEX) 20 MG tablet Take 40 mg by mouth 2 (two) times daily.   Yes [provider]  traZODone (DESYREL) 50 MG tablet Take 50 mg by mouth at bedtime as needed. 07/13/17  Yes [provider]  venlafaxine XR (EFFEXOR-XR) 150 MG 24 hr capsule Take 300 mg by mouth daily.    Yes [provider]  Ascorbic Acid (VITAMIN C) 1000 MG tablet Take 1,000 mg by mouth 3 (three) times daily.     [provider]  BREO ELLIPTA 100-25 MCG/INH AEPB Inhale 1 puff into the lungs daily. 07/24/17   [provider]  cetirizine (ZYRTEC) 10 MG tablet Take 1 tablet (10 mg total) by mouth daily. 08/15/17    Mayo, Pete Pelt, MD  Omega-3 Fatty Acids (FISH OIL) 1000 MG CAPS Take 2 capsules (2,000 mg total) by mouth at bedtime. 06/10/16   Mayo, Pete Pelt, MD  oxyCODONE-acetaminophen (PERCOCET) 10-325 MG tablet Take one tablet by mouth every 6 hours as needed for pain Patient not taking: Reported on 09/11/2017 07/28/17   Gerlene Fee, NP  polyethylene glycol (MIRALAX / GLYCOLAX) packet Take 17 g by mouth daily. Mix in 8 oz liquid and drink    [provider]    Family History Family History  Problem Relation Age of Onset  . Stroke Mother   . Dementia Father   . Prostate cancer  Father   . Congestive Heart Failure Brother   . Diabetes Brother   . Hypertension Brother     Social History Social History  Substance Use Topics  . Smoking status: Former Smoker    Packs/day: 1.00    Years: 30.00    Types: Cigarettes    Quit date: 01/05/2009  . Smokeless tobacco: Never Used  . Alcohol use No     Allergies   Aspirin   Review of Systems Review of Systems  Constitutional: Positive for fatigue. Negative for chills and fever.  HENT: Negative for ear pain and sore throat.   Eyes: Negative for pain and visual disturbance.  Respiratory: Negative for cough and shortness of breath.   Cardiovascular: Negative for chest pain and palpitations.  Gastrointestinal: Negative for abdominal pain and vomiting.  Genitourinary: Negative for dysuria and hematuria.  Musculoskeletal: Negative for arthralgias and back pain.  Skin: Negative for color change and rash.  Neurological: Negative for seizures and syncope.  All other systems reviewed and are negative.    Physical Exam Updated Vital Signs BP 138/74   Pulse 83   Temp 98.7 F (37.1 C) (Oral)   Resp 16   Wt (!) 169.7 kg (374 lb 1.6 oz)   SpO2 92%   BMI 68.42 kg/m   Physical Exam  Constitutional: She appears well-developed. She appears ill. No distress.  Morbidly obese. Somnolent on arrival. Improving mental status on reassessment    HENT:  Head: Normocephalic and atraumatic.  Eyes: Conjunctivae are normal.  Neck: Neck supple.  Cardiovascular: Normal rate and regular rhythm.   No murmur heard. Pulmonary/Chest: No respiratory distress. She has decreased breath sounds.  Abdominal: Soft. There is no tenderness.  Musculoskeletal: She exhibits no edema.  Neurological: No cranial nerve deficit. Coordination normal.  Moves all extremities  Skin: Skin is warm and dry.  Nursing note and vitals reviewed.    ED Treatments / Results  Labs (all labs ordered are listed, but only abnormal results are displayed) Labs Reviewed  COMPREHENSIVE METABOLIC PANEL - Abnormal; Notable for the following:       Result Value   Chloride 94 (*)    CO2 42 (*)    Glucose, Bld 127 (*)    Albumin 3.2 (*)    ALT 13 (*)    All other components within normal limits  CBC WITH DIFFERENTIAL/PLATELET - Abnormal; Notable for the following:    RBC 3.68 (*)    Hemoglobin 10.4 (*)    MCV 100.5 (*)    MCHC 28.1 (*)    RDW 16.5 (*)    Lymphs Abs 0.5 (*)    All other components within normal limits  AMMONIA - Abnormal; Notable for the following:    Ammonia 44 (*)    All other components within normal limits  ACETAMINOPHEN LEVEL - Abnormal; Notable for the following:    Acetaminophen (Tylenol), Serum <10 (*)    All other components within normal limits  BLOOD GAS, ARTERIAL - Abnormal; Notable for the following:    pH, Arterial 7.293 (*)    pCO2 arterial 89.0 (*)    pO2, Arterial 75.7 (*)    Bicarbonate 42.5 (*)    Acid-Base Excess 15.2 (*)    All other components within normal limits  COMPREHENSIVE METABOLIC PANEL - Abnormal; Notable for the following:    Chloride 94 (*)    CO2 39 (*)    Albumin 3.1 (*)    ALT 10 (*)    All other components  within normal limits  CBC - Abnormal; Notable for the following:    RBC 3.31 (*)    Hemoglobin 9.3 (*)    HCT 32.5 (*)    MCHC 28.6 (*)    RDW 15.9 (*)    All other components within normal  limits  CBG MONITORING, ED - Abnormal; Notable for the following:    Glucose-Capillary 117 (*)    All other components within normal limits  CBG MONITORING, ED - Abnormal; Notable for the following:    Glucose-Capillary 108 (*)    All other components within normal limits  CULTURE, BLOOD (ROUTINE X 2)  CULTURE, BLOOD (ROUTINE X 2)  URINALYSIS, ROUTINE W REFLEX MICROSCOPIC  RAPID URINE DRUG SCREEN, HOSP PERFORMED  SALICYLATE LEVEL  TSH  HEMOGLOBIN A1C  HIV ANTIBODY (ROUTINE TESTING)  BLOOD GAS, ARTERIAL  I-STAT CG4 LACTIC ACID, ED  I-STAT TROPONIN, ED  I-STAT VENOUS BLOOD GAS, ED  I-STAT ARTERIAL BLOOD GAS, ED  I-STAT CG4 LACTIC ACID, ED  CBG MONITORING, ED  CBG MONITORING, ED  CBG MONITORING, ED    EKG  EKG Interpretation  Date/Time:  Friday September 19 2017 19:56:39 EDT Ventricular Rate:  85 PR Interval:    QRS Duration: 147 QT Interval:  419 QTC Calculation: 499 R Axis:   -81 Text Interpretation:  Atrial flutter RBBB and LAFB Probable left ventricular hypertrophy Inferior infarct, acute Lateral leads are also involved No significant change since last tracing Confirmed by Deno Etienne [phone]) on 09/19/2017 10:15:21 PM       Radiology Dg Chest Port 1 View  Result Date: 09/19/2017 CLINICAL DATA:  56 y/o  F; altered mental status. EXAM: PORTABLE CHEST 1 VIEW COMPARISON:  11/26/2016 chest radiograph FINDINGS: Cardiomegaly. Patchy opacities throughout the lungs and prominent pulmonary markings. No pleural effusion or pneumothorax identified. Bones are unremarkable. IMPRESSION: Cardiomegaly. Diffuse patchy opacities throughout the lungs may represent alveolar pulmonary edema or multifocal pneumonia. Electronically Signed   By: Kristine Garbe M.D.   On: 09/19/2017 20:29    Procedures Procedures (including critical care time)  Medications Ordered in ED Medications  albuterol (PROVENTIL HFA;VENTOLIN HFA) 108 (90 Base) MCG/ACT inhaler 2 puff (not administered)    fluticasone furoate-vilanterol (BREO ELLIPTA) 100-25 MCG/INH 1 puff (1 puff Inhalation Not Given 09/20/17 1110)  mometasone-formoterol (DULERA) 100-5 MCG/ACT inhaler 2 puff (2 puffs Inhalation Not Given 09/20/17 0835)  torsemide (DEMADEX) tablet 40 mg (40 mg Oral Given 09/20/17 1035)  atorvastatin (LIPITOR) tablet 10 mg (not administered)  enoxaparin (LOVENOX) injection 40 mg (not administered)  0.9 %  sodium chloride infusion ( Intravenous Paused 09/20/17 0634)  ibuprofen (ADVIL,MOTRIN) tablet 800 mg (not administered)  polyethylene glycol (MIRALAX / GLYCOLAX) packet 17 g (not administered)  ondansetron (ZOFRAN) tablet 4 mg (not administered)    Or  ondansetron (ZOFRAN) injection 4 mg (not administered)  insulin aspart (novoLOG) injection 0-15 Units (0 Units Subcutaneous Not Given 09/20/17 1220)  nystatin (MYCOSTATIN/NYSTOP) topical powder (not administered)  oxyCODONE-acetaminophen (PERCOCET/ROXICET) 5-325 MG per tablet 1 tablet (1 tablet Oral Given 09/20/17 1035)  naloxone (NARCAN) injection 0.4 mg (0.4 mg Intravenous Given 09/19/17 2026)  cefTRIAXone (ROCEPHIN) 1 g in dextrose 5 % 50 mL IVPB (0 g Intravenous Stopped 09/19/17 2200)  azithromycin (ZITHROMAX) 500 mg in dextrose 5 % 250 mL IVPB (0 mg Intravenous Stopped 09/19/17 2307)  dextrose 50 % solution 25 mL (25 mLs Intravenous Given 09/20/17 0832)     Initial Impression / Assessment and Plan / ED Course  I have reviewed the  triage vital signs and the nursing notes.  Pertinent labs & imaging results that were available during my care of the patient were reviewed by me and considered in my medical decision making (see chart for details).     4 yoF h/o morbid obesity, diabetes, COPD, fibromyalgia, HTN, chronic back pain who p/w AMS. Initially very somnolent. Gradual improvement after giving narcan. Pt reportedly not taking Opana listed on med list.  Pt noncompliant with home BiPAP use. No focal neuro deficits. CBG 117. Decreased breath  sounds diffusely. Placed on home 4L [place]  CXR showing cardiomegaly and diffuse patchy opacities possibly representing pulmonary edema or multifocal pna. Rocephin/azithromycin given.   Pt remains somnolent though awakens easily and converses. Initial VBG difficulty running in machine. Preliminary read of pH 7.22, pCO2>90. Performed bedside US guided radial ABG draw. Site cleaned with chloraprep and sterile technique used. ABG showing [id number]. Suspect acute hypercarbic respiratory failure in setting of opiate use, noncompliance with home BiPAP use, and hypoventilation from morbid obesity. Discussed with family medicine, agree on BiPAP trial and admit to SDU. Pt and brother stating desire for DNR/DNI.  Pt care d/w Dr. Tyrone Nine  Final Clinical Impressions(s) / ED Diagnoses   Final diagnoses:  Somnolence  Altered mental status, unspecified altered mental status type  Acute respiratory failure with hypercapnia [facility])    New Prescriptions New Prescriptions   No medications on file     Payton Emerald, MD 09/20/17 [place], [place], DO 09/21/17 1458

## 2017-09-19 NOTE — ED Triage Notes (Signed)
Pt arrived via [facility] from home c/o AMS and tremors.  Pt's brother called EMS 1900 states that pt is difficult to arouse sleeping in recliner, LKN 03:00.  Pt is responsive to pain on arrival.

## 2017-09-19 NOTE — Care Management (Signed)
Patient presented to [facility] ED with AMS. ED CM met with patient at bedside patient reports being active with [facility] and was recently discharged from [facility] with [facility]. Patient is also active with [facility]. ED evaluation still in progress. CM will continue to follow for transitional care planning.

## 2017-09-19 NOTE — H&P (Signed)
[facility] Admission History and Physical Service Pager: [phone]  [name] Medical record number: [id number] Date of birth: Dec 02, 1961 Age: 56 y.o. Gender: female  Primary Care Provider: Mayo, Pete Pelt, MD Consultants: None Code Status: DNR  Chief Complaint: AMS   Assessment and Plan: Kathleen Roy is a 56 y.o. female presenting with AMS . PMH is significant for morbid obesity, MDD, chronic back pain, COPD, OSA, diastolic heart failure, HTN, T2DM, myoclonus, and immobility.   AMS Patient having AMS x1 day. Patient last seen at baseline yesterday evening before going to sleep. Patient's brother notes that patient was not oriented to place and was confused. Stated that patient was not arousable, and even attempted to slap patient to wake her up with no success. Likely 2/2 medication use. Patient is in charge of administering her own medications and has recently changed her pain medications. Per ED physician patient was somnolent but began to become more awake after a dose of narcan was administered. No recent fall so less likely due to trauma. ABG on admission showing acidosis with pH of 7.293 and elevated pCO2 to 89.0. Possible secondary respiratory acidosis leading to some AMS as well. CBG of 117, so unlikely related to hypoglycemia. UDS negative so unlikely 2/2 substance use. UA negative so unlikely related to UTI. Ammonia slightly elevated to 44, so unlikely to be main cause for AMS. CBC showing normal WBC count so less likely infectious in origin.  -admit to step-down, attending Dr. Nori Riis -continuous cardiac monitoring -continuous pulse ox -am CBC, CMP, TSH -NPO while on bipap and also during acute AMS -NS @ 200 cc/hr -monitor blood culture   Hypercarbic Hypoxic Respiratory failure with Respiratory acidosis Arterial blood gas in ED showing pH 7.293, pCO2 89.0, pO2 75.7, Bicarbonate of 42.5. Uncertain source of respiratory failure. Suspect  possible opioid overdose given improvement with Narcan. Given pt deconditioned state, large body habitus and non-adherence to CPAP, she may have a chronic baseline respiratory acidosis. -continue to monitor -continuous bipap  Chronic back pain History of chronic pain. Patient taking flexeril, percocet, and was prescribed opana but has not been taking.  -hold home meds while acutely altered   COPD and OSA Chronic. Per patient's brother patient does not use home CPAP or bipap. Patient on 4L O2 at home  -albuterol 2 puff q6hrs prn  -breo 1 puff daily  -dulera 2 puff bid   Diastolic heart failure Chronic. Stable. Echo in 08/2016 showing EF of 60-65%. Home torsemide 40 mg bid. No signs of fluid overload. -monitor I&O -daily weights  -torsemide 40 mg bid   HTN Chronic. Stable. Home atorvastatin and torsemide -continue home medications   T2DM Chronic. Stable. No home medications. No signs of hypoglycemia on admission to support AMS. -monitor A1C -monitor CBGs -moderate SSI  Depression Chronic. Stable. Home medications of venlafaxine and trazodone -hold home medications while acutely altered   Immobility  History of immobility. Per patient's brother patient is able to transfer from recliner to bedside commode but no other movements. Currently in the process of trying to get [place].   FEN/GI: NPO while on BiPAP with AMS Prophylaxis:  lovenox   Disposition: admit to step down, attending Dr. Nori Riis   History of Present Illness:   Kathleen Roy is a 56 y.o. female presenting with AMS . PMH is significant for morbid obesity, MDD, chronic back pain, COPD, OSA, diastolic heart failure, HTN, T2DM, myoclonus, and immobility.   History obtained  from patients brother. Per patient's brother patient was last seen at her baseline at 3:30am on Friday when she went to bed. Brother attempted to wake patient up at 11:30pm at which time patient was not waking up. Patient was unable to be woken till  around 7pm. Brother attempted to even slap patient and patient did not arouse. Brother called EMS. Brother notes that patient was not oriented to place initially and did not understand why she was going to the hospital.   Patient's brother notes that prior to today patient was at her baseline, which includes short transfer from recliner to bedside commode. Denies recent fever, chills, falls, or alcohol/illicit drug use. Brother does note that patient had recent odor in her urine x 2days. Denies recent illness. Brother notes recent medication changes of self discontinuation of opana, and beginning flexeril and percocet, but is unsure of how much pain medication she is taking as patient administers all her own medications.   Per ED patient has been somnolent in ED. Patient's brother informed ED physician that patient has been sleeping 16 hours straight and he was unable to wake her even by slapping her face. Patient's brother notes that patient has been taking more of her home flexeril and percocet recently. Patient was given one dose of narcan in the ED which showed some improvement in somnolence. ED also gave patient dose of rocephin and azithromycin due to CXR showing possible multifocal PNA vs. Edema. Blood gas was unable to be obtained in ED.   Review Of Systems:   Unable to obtain from patient due to AMS  ROS  Patient Active Problem List   Diagnosis Date Noted  . Altered mental status 09/20/2017  . 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 Allergic rhinitis 08/15/2017  . Immobility 08/15/2017  . Fibromyalgia 06/16/2017  . Chronic pain of both knees   . Type 2 diabetes mellitus ([place]) 02/14/2016  . Chronic diastolic congestive heart failure ([place])   . Essential hypertension   . OSA (obstructive sleep apnea)   . Obesity hypoventilation syndrome ([place])   . Chronic respiratory failure with hypoxia ([place])   . Memory loss 03/18/2014  . Venous stasis dermatitis of both lower extremities 12/02/2012  . COPD (chronic  obstructive pulmonary disease) ([place]) 10/15/2011  . Peripheral neuropathy ([place]) 10/15/2011  . BACK PAIN, LUMBAR, CHRONIC 01/22/2011  . Morbid obesity ([place]) 11/16/2007  . Major depressive disorder, recurrent episode ([place]) 02/12/2007    Past Medical History: Past Medical History:  Diagnosis Date  . Arthritis    "I feel like it's everywhere" (11/08/2014)  . Cellulitis and abscess of lower extremity 08/16/2016   right leg  . Chronic lower back pain   . COPD (chronic obstructive pulmonary disease) ([place])   . Depression   . Diabetes mellitus without complication ([place])    type 2   . Fibromyalgia   . Headache    "at least weekly; it's usually when I first wake up" (11/08/2014)  . Hyperlipidemia   . Hypertension   . HYPERTENSION, BENIGN SYSTEMIC 02/12/2007  . Memory disturbance   . Migraines 1970's - <2000   "they just went away"  . Neuropathy   . Normal echocardiogram 05/30/05   suboptimal study  . Obesity, morbid (more than 100 lbs over ideal weight or BMI > 40) (HCC)    obese since childhood  . Osteoarthritis   . Peripheral edema   . Pressure ulcer of foot    left  . Sepsis ([place])   . Shortness of breath  dyspnea   . Sleep apnea    "recently dx'd; haven't got my equipment yet" (11/08/2014)    Past Surgical History: Past Surgical History:  Procedure Laterality Date  . ABDOMINAL HERNIA REPAIR  2002  . APPENDECTOMY  1995  . CHOLECYSTECTOMY  1995  . TOTAL ABDOMINAL HYSTERECTOMY  1994    Social History: Social History  Substance Use Topics  . Smoking status: Former Smoker    Packs/day: 1.00    Years: 30.00    Types: Cigarettes    Quit date: 01/05/2009  . Smokeless tobacco: Never Used  . Alcohol use No   Additional social history: Former smoker. No EtOH or illicit drug use. Lives with brother. Takes her own medication. Please also refer to relevant sections of EMR.  Family History: Family History  Problem Relation Age of Onset  . Stroke Mother   . Dementia Father    . Prostate cancer Father   . Congestive Heart Failure Brother   . Diabetes Brother   . Hypertension Brother    Allergies and Medications: Allergies  Allergen Reactions  . Aspirin Hives    REACTION: hives Hasn't had in years    No current facility-administered medications on file prior to encounter.    Current Outpatient Prescriptions on File Prior to Encounter  Medication Sig Dispense Refill  . ARIPiprazole (ABILIFY) 2 MG tablet Take 1 tablet (2 mg total) by mouth every other day. 45 tablet 0  . atorvastatin (LIPITOR) 10 MG tablet Take 1 tablet (10 mg total) by mouth at bedtime. 90 tablet 0  . cyclobenzaprine (FLEXERIL) 10 MG tablet Take 1 tablet (10 mg total) by mouth 3 (three) times daily as needed for muscle spasms. 90 tablet 2  . OXYGEN Inhale 4 L into the lungs continuous.     Marland Kitchen oxymorphone (OPANA ER) 7.5 MG TB12 12 hr tablet Take 1 tablet (7.5 mg total) by mouth every 12 (twelve) hours. (Patient taking differently: Take 7.5 mg by mouth every 8 (eight) hours. ) 30 tablet 0  . potassium chloride SA (K-DUR,KLOR-CON) 20 MEQ tablet Take 2 tablets once a day. (Patient taking differently: Take 40 mEq by mouth daily. ) 180 tablet 0  . pregabalin (LYRICA) 150 MG capsule Take 1 capsule (150 mg total) by mouth 2 (two) times daily. 60 capsule 0  . torsemide (DEMADEX) 20 MG tablet Take 40 mg by mouth 2 (two) times daily.    Marland Kitchen venlafaxine XR (EFFEXOR-XR) 150 MG 24 hr capsule Take 300 mg by mouth daily.     . Ascorbic Acid (VITAMIN C) 1000 MG tablet Take 1,000 mg by mouth 3 (three) times daily.     . cetirizine (ZYRTEC) 10 MG tablet Take 1 tablet (10 mg total) by mouth daily. 90 tablet 2  . Omega-3 Fatty Acids (FISH OIL) 1000 MG CAPS Take 2 capsules (2,000 mg total) by mouth at bedtime. 60 capsule 2  . oxyCODONE-acetaminophen (PERCOCET) 10-325 MG tablet Take one tablet by mouth every 6 hours as needed for pain (Patient not taking: Reported on 09/11/2017) 10 tablet 0  . polyethylene glycol  (MIRALAX / GLYCOLAX) packet Take 17 g by mouth daily. Mix in 8 oz liquid and drink      Objective: BP 127/72   Pulse 83   Temp (!) 96.2 F (35.7 C) (Rectal)   Resp (!) 21   Wt (!) 374 lb 1.6 oz (169.7 kg)   SpO2 100%   BMI 68.42 kg/m  Exam: General: obese female, awake, alert, slow speech,  intermittently unable to follow commands, in distress  HEENT: normocephalic, atraumatic, moist mucous membranes, PERRL, EOMI CV: difficult exam due to body habitus, regular rate and rhythm without murmurs, rubs, or gallops Lungs: difficult exam due to body habitus, clear to auscultation bilaterally with normal work of breathing Abdomen: soft, non-tender, no masses or organomegaly palpable Skin: warm, dry, erythema under L axilla, erythema to feet and lower legs bilaterally  Extremities: warm and well perfused, normal grip strength, diffuse non-pitting edema  Neuro: AAOx3, sensation intact bilaterally, grip strength normal, neuro exam limited as patient intermittently unable to follow commands   Labs and Imaging: CBC BMET   Recent Labs Lab 09/19/17 2002  WBC 6.6  HGB 10.4*  HCT 37.0  PLT 159    Recent Labs Lab 09/19/17 2002  NA 142  K 5.1  CL 94*  CO2 42*  BUN 10  CREATININE 0.79  GLUCOSE 127*  CALCIUM 9.1     CBG (last 3)   Recent Labs  09/19/17 1952  GLUCAP 117*     Ref. Range 09/19/2017 23:30  Sample type Unknown ARTERIAL DRAW  Delivery systems Unknown NASAL CANNULA  O2 Content Latest Units: L/min 4.0  pH, Arterial Latest Ref Range: 7.350 - 7.450  7.293 (L)  pCO2 arterial Latest Ref Range: 32.0 - 48.0 mmHg 89.0 (HH)  pO2, Arterial Latest Ref Range: 83.0 - 108.0 mmHg 75.7 (L)  Acid-Base Excess Latest Ref Range: 0.0 - 2.0 mmol/L 15.2 (H)  Bicarbonate Latest Ref Range: 20.0 - 28.0 mmol/L 42.5 (H)  O2 Saturation Latest Units: % 95.6  Patient temperature Unknown 96.6  Collection site Unknown FEMORAL ARTERY    Ref. Range 09/19/2017 23:17  Appearance Latest Ref Range:  CLEAR  CLEAR  Bilirubin Urine Latest Ref Range: NEGATIVE  NEGATIVE  Color, Urine Latest Ref Range: YELLOW  YELLOW  Glucose Latest Ref Range: NEGATIVE mg/dL NEGATIVE  Hgb urine dipstick Latest Ref Range: NEGATIVE  NEGATIVE  Ketones, ur Latest Ref Range: NEGATIVE mg/dL NEGATIVE  Leukocytes, UA Latest Ref Range: NEGATIVE  NEGATIVE  Nitrite Latest Ref Range: NEGATIVE  NEGATIVE  pH Latest Ref Range: 5.0 - 8.0  6.0  Protein Latest Ref Range: NEGATIVE mg/dL NEGATIVE  Specific Gravity, Urine Latest Ref Range: 1.005 - 1.030  1.013    Ref. Range 09/19/2017 23:17  Amphetamines Latest Ref Range: NONE DETECTED  NONE DETECTED  Barbiturates Latest Ref Range: NONE DETECTED  NONE DETECTED  Benzodiazepines Latest Ref Range: NONE DETECTED  NONE DETECTED  Opiates Latest Ref Range: NONE DETECTED  NONE DETECTED  COCAINE Latest Ref Range: NONE DETECTED  NONE DETECTED  Tetrahydrocannabinol Latest Ref Range: NONE DETECTED  NONE DETECTED    Ref. Range 09/19/2017 20:08  Lactic Acid, Venous Latest Ref Range: 0.5 - 1.9 mmol/L 1.59    Dg Chest Port 1 View  Result Date: 09/19/2017 CLINICAL DATA:  56 y/o  F; altered mental status. EXAM: PORTABLE CHEST 1 VIEW COMPARISON:  11/26/2016 chest radiograph FINDINGS: Cardiomegaly. Patchy opacities throughout the lungs and prominent pulmonary markings. No pleural effusion or pneumothorax identified. Bones are unremarkable. IMPRESSION: Cardiomegaly. Diffuse patchy opacities throughout the lungs may represent alveolar pulmonary edema or multifocal pneumonia. Electronically Signed   By: Kristine Garbe M.D.   On: 09/19/2017 20:29    Caroline More, DO 09/20/2017, 2:07 AM PGY-1, [place] Intern pager: [phone], text pages welcome  Upper Level Addendum:  I have seen and evaluated this patient along with Dr. Tammi Klippel and reviewed the above note, making necessary  revisions in blue.  Harriet [place], [place], PGY-2

## 2017-09-19 NOTE — ED Notes (Signed)
Unable to collect blood for vbg,  QNS and the other pleb will try.

## 2017-09-20 ENCOUNTER — Other Ambulatory Visit: Payer: Self-pay

## 2017-09-20 DIAGNOSIS — Z66 Do not resuscitate: Secondary | ICD-10-CM | POA: Diagnosis present

## 2017-09-20 DIAGNOSIS — D649 Anemia, unspecified: Secondary | ICD-10-CM | POA: Diagnosis present

## 2017-09-20 DIAGNOSIS — I509 Heart failure, unspecified: Secondary | ICD-10-CM | POA: Diagnosis not present

## 2017-09-20 DIAGNOSIS — Z9981 Dependence on supplemental oxygen: Secondary | ICD-10-CM | POA: Diagnosis not present

## 2017-09-20 DIAGNOSIS — J9622 Acute and chronic respiratory failure with hypercapnia: Secondary | ICD-10-CM | POA: Diagnosis present

## 2017-09-20 DIAGNOSIS — R4182 Altered mental status, unspecified: Secondary | ICD-10-CM

## 2017-09-20 DIAGNOSIS — B372 Candidiasis of skin and nail: Secondary | ICD-10-CM | POA: Diagnosis present

## 2017-09-20 DIAGNOSIS — E662 Morbid (severe) obesity with alveolar hypoventilation: Secondary | ICD-10-CM | POA: Diagnosis present

## 2017-09-20 DIAGNOSIS — Z9119 Patient's noncompliance with other medical treatment and regimen: Secondary | ICD-10-CM | POA: Diagnosis not present

## 2017-09-20 DIAGNOSIS — I5032 Chronic diastolic (congestive) heart failure: Secondary | ICD-10-CM | POA: Diagnosis present

## 2017-09-20 DIAGNOSIS — E1142 Type 2 diabetes mellitus with diabetic polyneuropathy: Secondary | ICD-10-CM | POA: Diagnosis present

## 2017-09-20 DIAGNOSIS — F339 Major depressive disorder, recurrent, unspecified: Secondary | ICD-10-CM | POA: Diagnosis present

## 2017-09-20 DIAGNOSIS — R400 Somnolence: Secondary | ICD-10-CM | POA: Diagnosis not present

## 2017-09-20 DIAGNOSIS — I110 Hypertensive heart disease with heart failure: Secondary | ICD-10-CM | POA: Diagnosis present

## 2017-09-20 DIAGNOSIS — M545 Low back pain: Secondary | ICD-10-CM | POA: Diagnosis present

## 2017-09-20 DIAGNOSIS — Z7951 Long term (current) use of inhaled steroids: Secondary | ICD-10-CM | POA: Diagnosis not present

## 2017-09-20 DIAGNOSIS — K5903 Drug induced constipation: Secondary | ICD-10-CM | POA: Diagnosis not present

## 2017-09-20 DIAGNOSIS — G8929 Other chronic pain: Secondary | ICD-10-CM | POA: Diagnosis present

## 2017-09-20 DIAGNOSIS — E785 Hyperlipidemia, unspecified: Secondary | ICD-10-CM | POA: Diagnosis present

## 2017-09-20 DIAGNOSIS — Z23 Encounter for immunization: Secondary | ICD-10-CM | POA: Diagnosis not present

## 2017-09-20 DIAGNOSIS — J9602 Acute respiratory failure with hypercapnia: Secondary | ICD-10-CM | POA: Diagnosis not present

## 2017-09-20 DIAGNOSIS — J9600 Acute respiratory failure, unspecified whether with hypoxia or hypercapnia: Secondary | ICD-10-CM | POA: Diagnosis not present

## 2017-09-20 DIAGNOSIS — M797 Fibromyalgia: Secondary | ICD-10-CM | POA: Diagnosis present

## 2017-09-20 DIAGNOSIS — Z87891 Personal history of nicotine dependence: Secondary | ICD-10-CM | POA: Diagnosis not present

## 2017-09-20 DIAGNOSIS — J9621 Acute and chronic respiratory failure with hypoxia: Secondary | ICD-10-CM | POA: Diagnosis present

## 2017-09-20 DIAGNOSIS — E872 Acidosis: Secondary | ICD-10-CM | POA: Diagnosis present

## 2017-09-20 DIAGNOSIS — Z6841 Body Mass Index (BMI) 40.0 and over, adult: Secondary | ICD-10-CM | POA: Diagnosis not present

## 2017-09-20 DIAGNOSIS — J449 Chronic obstructive pulmonary disease, unspecified: Secondary | ICD-10-CM | POA: Diagnosis present

## 2017-09-20 DIAGNOSIS — Z79899 Other long term (current) drug therapy: Secondary | ICD-10-CM | POA: Diagnosis not present

## 2017-09-20 LAB — COMPREHENSIVE METABOLIC PANEL
ALBUMIN: 3.1 g/dL — AB (ref 3.5–5.0)
ALT: 10 U/L — ABNORMAL LOW (ref 14–54)
ANION GAP: 9 (ref 5–15)
AST: 16 U/L (ref 15–41)
Alkaline Phosphatase: 104 U/L (ref 38–126)
BILIRUBIN TOTAL: 0.6 mg/dL (ref 0.3–1.2)
BUN: 8 mg/dL (ref 6–20)
CALCIUM: 9.1 mg/dL (ref 8.9–10.3)
CO2: 39 mmol/L — ABNORMAL HIGH (ref 22–32)
Chloride: 94 mmol/L — ABNORMAL LOW (ref 101–111)
Creatinine, Ser: 0.67 mg/dL (ref 0.44–1.00)
GFR calc non Af Amer: >60 mL/min (ref >60)
GLUCOSE: 76 mg/dL (ref 65–99)
POTASSIUM: 4.4 mmol/L (ref 3.5–5.1)
Sodium: 142 mmol/L (ref 135–145)
TOTAL PROTEIN: 6.9 g/dL (ref 6.5–8.1)

## 2017-09-20 LAB — HIV ANTIBODY (ROUTINE TESTING W REFLEX): HIV SCREEN 4TH GENERATION: NONREACTIVE

## 2017-09-20 LAB — CBC
HEMATOCRIT: 32.5 % — AB (ref 36.0–46.0)
HEMOGLOBIN: 9.3 g/dL — AB (ref 12.0–15.0)
MCH: 28.1 pg (ref 26.0–34.0)
MCHC: 28.6 g/dL — AB (ref 30.0–36.0)
MCV: 98.2 fL (ref 78.0–100.0)
Platelets: 174 10*3/uL (ref 150–400)
RBC: 3.31 MIL/uL — AB (ref 3.87–5.11)
RDW: 15.9 % — ABNORMAL HIGH (ref 11.5–15.5)
WBC: 6.5 10*3/uL (ref 4.0–10.5)

## 2017-09-20 LAB — GLUCOSE, CAPILLARY
GLUCOSE-CAPILLARY: 137 mg/dL — AB (ref 65–99)
GLUCOSE-CAPILLARY: 161 mg/dL — AB (ref 65–99)

## 2017-09-20 LAB — HEMOGLOBIN A1C
HEMOGLOBIN A1C: 4.9 % (ref 4.8–5.6)
Mean Plasma Glucose: 93.93 mg/dL

## 2017-09-20 LAB — CBG MONITORING, ED
GLUCOSE-CAPILLARY: 73 mg/dL (ref 65–99)
GLUCOSE-CAPILLARY: 82 mg/dL (ref 65–99)
GLUCOSE-CAPILLARY: 108 mg/dL — AB (ref 65–99)
Glucose-Capillary: 66 mg/dL (ref 65–99)

## 2017-09-20 LAB — TSH: TSH: 0.424 u[IU]/mL (ref 0.350–4.500)

## 2017-09-20 MED ORDER — ACETAMINOPHEN 650 MG RE SUPP: 650.0000 mg | Freq: Four times a day (QID) | RECTAL | Status: DC | PRN

## 2017-09-20 MED ORDER — DEXTROSE 50 % IV SOLN
25.0000 mL | Freq: Once | INTRAVENOUS | Status: AC
  Administered 2017-09-20: 25 mL via INTRAVENOUS
  Filled 2017-09-20: qty 50

## 2017-09-20 MED ORDER — INSULIN ASPART 100 UNIT/ML ~~LOC~~ SOLN
0.0000 [IU] | SUBCUTANEOUS | Status: DC
  Administered 2017-09-20: 3 [IU] via SUBCUTANEOUS
  Administered 2017-09-20 – 2017-09-22 (×5): 2 [IU] via SUBCUTANEOUS

## 2017-09-20 MED ORDER — NYSTATIN 100000 UNIT/GM EX POWD
Freq: Three times a day (TID) | CUTANEOUS | Status: DC | PRN
  Filled 2017-09-20: qty 15

## 2017-09-20 MED ORDER — SODIUM CHLORIDE 0.9 % IV SOLN
INTRAVENOUS | Status: AC
  Administered 2017-09-20 (×2): via INTRAVENOUS

## 2017-09-20 MED ORDER — INFLUENZA VAC SPLIT QUAD 0.5 ML IM SUSY
0.5000 mL | PREFILLED_SYRINGE | INTRAMUSCULAR | Status: AC
  Administered 2017-09-21: 0.5 mL via INTRAMUSCULAR
  Filled 2017-09-20: qty 0.5

## 2017-09-20 MED ORDER — ACETAMINOPHEN 325 MG PO TABS: 650.0000 mg | ORAL_TABLET | Freq: Four times a day (QID) | ORAL | Status: DC | PRN

## 2017-09-20 MED ORDER — ALBUTEROL SULFATE HFA 108 (90 BASE) MCG/ACT IN AERS: 2.0000 | INHALATION_SPRAY | Freq: Four times a day (QID) | RESPIRATORY_TRACT | Status: DC | PRN

## 2017-09-20 MED ORDER — IBUPROFEN 400 MG PO TABS: 800.0000 mg | ORAL_TABLET | Freq: Four times a day (QID) | ORAL | Status: DC | PRN

## 2017-09-20 MED ORDER — ONDANSETRON HCL 4 MG PO TABS: 4.0000 mg | ORAL_TABLET | Freq: Four times a day (QID) | ORAL | Status: DC | PRN

## 2017-09-20 MED ORDER — NYSTATIN 100000 UNIT/GM EX POWD: CUTANEOUS | Status: DC

## 2017-09-20 MED ORDER — MOMETASONE FURO-FORMOTEROL FUM 100-5 MCG/ACT IN AERO: 2.0000 | INHALATION_SPRAY | Freq: Two times a day (BID) | RESPIRATORY_TRACT | Status: DC

## 2017-09-20 MED ORDER — ALBUTEROL SULFATE (2.5 MG/3ML) 0.083% IN NEBU: 2.5000 mg | INHALATION_SOLUTION | Freq: Four times a day (QID) | RESPIRATORY_TRACT | Status: DC | PRN

## 2017-09-20 MED ORDER — POLYETHYLENE GLYCOL 3350 17 G PO PACK: 17.0000 g | PACK | Freq: Every day | ORAL | Status: DC | PRN

## 2017-09-20 MED ORDER — OXYCODONE-ACETAMINOPHEN 5-325 MG PO TABS
1.0000 | ORAL_TABLET | Freq: Four times a day (QID) | ORAL | Status: DC | PRN
  Administered 2017-09-20 – 2017-09-22 (×3): 1 via ORAL
  Filled 2017-09-20 (×4): qty 1

## 2017-09-20 MED ORDER — FLUTICASONE FUROATE-VILANTEROL 100-25 MCG/INH IN AEPB
1.0000 | INHALATION_SPRAY | Freq: Every day | RESPIRATORY_TRACT | Status: DC
  Administered 2017-09-20: 15:00:00 1 via RESPIRATORY_TRACT
  Filled 2017-09-20: qty 28

## 2017-09-20 MED ORDER — ONDANSETRON HCL 4 MG/2ML IJ SOLN: 4.0000 mg | Freq: Four times a day (QID) | INTRAMUSCULAR | Status: DC | PRN

## 2017-09-20 MED ORDER — ATORVASTATIN CALCIUM 10 MG PO TABS
10.0000 mg | ORAL_TABLET | Freq: Every day | ORAL | Status: DC
  Administered 2017-09-20 – 2017-09-21 (×2): 10 mg via ORAL
  Filled 2017-09-20 (×2): qty 1

## 2017-09-20 MED ORDER — ENOXAPARIN SODIUM 40 MG/0.4ML ~~LOC~~ SOLN
40.0000 mg | SUBCUTANEOUS | Status: DC
  Administered 2017-09-20 – 2017-09-22 (×3): 40 mg via SUBCUTANEOUS
  Filled 2017-09-20 (×3): qty 0.4

## 2017-09-20 MED ORDER — POLYETHYLENE GLYCOL 3350 17 G PO PACK: 17.0000 g | PACK | Freq: Every day | ORAL | Status: DC

## 2017-09-20 MED ORDER — OXYCODONE-ACETAMINOPHEN 5-325 MG PO TABS
1.0000 | ORAL_TABLET | Freq: Once | ORAL | Status: AC
  Administered 2017-09-20: 1 via ORAL

## 2017-09-20 MED ORDER — TORSEMIDE 20 MG PO TABS
40.0000 mg | ORAL_TABLET | Freq: Two times a day (BID) | ORAL | Status: DC
  Administered 2017-09-20 – 2017-09-22 (×6): 40 mg via ORAL
  Filled 2017-09-20 (×6): qty 2

## 2017-09-20 NOTE — Patient Outreach (Signed)
[facility]) Care Management  09/20/17  NAMIYAH GRANTHAM 24-Feb-1961 [id number]  Successful outreach completed with patient's brother, Margareta Laureano. Patient identification verified. Per [name], patient has been admitted to the hospital. He stated that they both went to bed around 3:30 am on Friday morning and Babbie slept all day. He stated that he was unable to wake her up and she was not responding to him yelling her name, touching her and he even tried to slap her to wake her up, but she did not respond. He stated that he called EMS and they took her to the hospital. He stated that at the emergency department, she was confused and did not know where she was and could not answer simple questions. He stated that he was not sure what was wrong, but knows that she was on antibiotics when he left last night. He stated that he has talked with her today on the phone and she is more coherent. He stated, "but I believe they are still trying to figure out what happened and I don't know how long she will be there."   [name] did state that patient had stopped taking her Opana. Despite cutting it back to every 12 hours, she was still feeling as if it was causing her to sleep more and stopped taking it without discussing with her doctors.   RNCM encouraged Don to call with any questions/concerns and will follow up next week regarding patient's status and he was agreeable.  [place] R. Sahar Ryback, RN, BSN, [place] Management Coordinator [phone]

## 2017-09-20 NOTE — ED Notes (Signed)
Pt eating lunch at this time

## 2017-09-20 NOTE — ED Notes (Signed)
CBG 108. 

## 2017-09-20 NOTE — ED Notes (Signed)
Pt moved to bariatric bed; chux and linens changed, new purewick put in place

## 2017-09-20 NOTE — ED Notes (Signed)
Pt cbg 66. NPO on BIPAP. 37ml D50 ordered per hypoglycemia protocol. Will reassess.

## 2017-09-20 NOTE — Progress Notes (Signed)
Kathleen Roy [id number] Admission Data: 09/20/2017 6:57 PM Attending Provider: Dickie La, MD  Kathleen Roy, Kathleen Pelt, MD  Kathleen Roy is a 56 y.o. female patient admitted from ED awake, alert  & orientated  X 3-4,  DNR, VSS - Blood pressure 116/90, pulse (!) 102, temperature 99.7 F (37.6 C), temperature source Oral, resp. rate 18, height 5\' 2"  (1.575 m), weight (!) 169.7 kg (374 lb 1.6 oz), SpO2 100 %., O2  4 L nasal cannular, no c/o shortness of breath, no c/o chest pain, no distress noted. Tele # 27 placed IV site WDL:  Right shoulder with a transparent dsg that's clean dry and intact.  Allergies:   Allergies  Allergen Reactions  . Aspirin Hives    REACTION: hives Hasn't had in years      Past Medical History:  Diagnosis Date  . Arthritis    "I feel like it's everywhere" (11/08/2014)  . Cellulitis and abscess of lower extremity 08/16/2016   right leg  . Chronic lower back pain   . COPD (chronic obstructive pulmonary disease) ([place])   . Depression   . Diabetes mellitus without complication ([place])    type 2   . Fibromyalgia   . Headache    "at least weekly; it's usually when I first wake up" (11/08/2014)  . Hyperlipidemia   . Hypertension   . HYPERTENSION, BENIGN SYSTEMIC 02/12/2007  . Memory disturbance   . Migraines 1970's - <2000   "they just went away"  . Neuropathy   . Normal echocardiogram 05/30/05   suboptimal study  . Obesity, morbid (more than 100 lbs over ideal weight or BMI > 40) (HCC)    obese since childhood  . Osteoarthritis   . Peripheral edema   . Pressure ulcer of foot    left  . Sepsis ([place])   . Shortness of breath dyspnea   . Sleep apnea    "recently dx'd; haven't got my equipment yet" (11/08/2014)    Pt oriented to unit, room and routine. Information packet given to patient/family.  Admission INP armband ID verified with patient/family, and in place. Fall risk assessment complete with patient and family verbalizing understanding of risks associated  with falls. Pt verbalizes an understanding of how to use the call bell and to call for help before getting out of bed.  Skin assessment complete with nonpitting generalized edema, redness and extensive edema of bilateral lower extremities and feet.    Patient shows confusion by stating "before I came here they told me to pee on the floor and water the flowers" but can answer A&O questions appropriately.  Pt reports no relief of pain with percocet.  Obtained verbal order for one time 5mg  percocet dose. Will cont to monitor and assist as needed.  Kathleen Kerns, RN 09/20/2017 6:57 PM

## 2017-09-20 NOTE — ED Notes (Signed)
CBG 66 

## 2017-09-20 NOTE — ED Notes (Signed)
[place]

## 2017-09-20 NOTE — Clinical Social Work Note (Signed)
Clinical Social Work Assessment  Patient Details  Name: Kathleen Roy MRN: [id number] Date of Birth: 1961/10/16  Date of referral:  09/20/17               Reason for consult:  Discharge Planning, [employer]                Permission sought to share information with:  Case Manager Permission granted to share information::  Yes, Verbal Permission Granted  Name::        Agency::     Relationship::     Contact Information:     Housing/Transportation Living arrangements for the past 2 months:  Single Family Home Source of Information:  Patient Patient Interpreter Needed:  None Criminal Activity/Legal Involvement Pertinent to Current Situation/Hospitalization:  No - Comment as needed Significant Relationships:  Siblings Lives with:  Siblings (Patient stated she lives with her brother) Do you feel safe going back to the place where you live?  Yes Need for family participation in patient care:  Yes (Comment)  Care giving concerns:  Patient presents to [facility] ED for treatment regarding her altered mental status. Patient states that she dispenses all medications herself and after a recent discussion with her brother about how much she was sleeping, she immediately stopped taking her prescribed Opana medication. Patient reported that she was prescribed this medication from Dr. Mirna Roy, who is not her PCP. Dr. Brett Roy at [facility] is her current PCP, no provider is currently aware that she stopped taking her medication. RN Kathleen Roy informed CSW that patient was slowly starting to become more oriented this morning. Patient is not able to take care of herself due to morbid obesity and her hygiene was poor.   Social Worker assessment / plan: CSW met with patient to discuss prior living arrangements and possible SNF placement. Prior to coming to [facility], patient was living with her brother for one month. Prior to moving back home with her brother, patient was in SNF for 4 1/2 months. Patient's brother  found patient yesterday unresponsive after having slept for over 14 hours. Patient stated that her [place] recently ended and she only had it for a few weeks. During assessment, patient repeatedly stated that she wanted to go back home with her brother and not to SNF. Patient stated that she was in the process of receiving [place] services through unknown source. CSW informed patient that since medication issues are involved and HH was needed, that CSW would contact case management for further needs.  Employment status:  Disabled (Comment on whether or not currently receiving Disability) Insurance information:  Managed Medicare PT Recommendations:  [place] / Referral to community resources:     Patient/Family's Response to care:  Patient was emotional and inquisitve as to why she was in the ED, CSW discussed recent events with patient. Patient understanding and informed CSW of recent medication changes.  Patient/Family's Understanding of and Emotional Response to Diagnosis, Current Treatment, and Prognosis:  No family at bedside, patient stated her brother was "probably at home." CSW offered to call patient's brother, Kathleen Roy but patient declined.  Emotional Assessment Appearance:    Attitude/Demeanor/Rapport:    Affect (typically observed):  Accepting, Tearful/Crying Orientation:  Oriented to Self, Oriented to Place, Oriented to  Time, Oriented to Situation Alcohol / Substance use:  Not Applicable Psych involvement (Current and /or in the community):  No (Comment)  Discharge Needs  Concerns to be addressed:  Medication Concerns, Discharge Planning  Concerns Readmission within the last 30 days:  No Current discharge risk:  Dependent with Mobility, Physical Impairment Barriers to Discharge:  Continued Medical Work up   [employer], [place] 09/20/2017, 11:59 AM

## 2017-09-20 NOTE — Progress Notes (Signed)
PT refused ABG  Stated she has been stuck over 6 times for ABGs and cant do anynore
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
## 2017-09-20 NOTE — Progress Notes (Signed)
Family Medicine Teaching Service Daily Progress Note Intern Pager: [phone]  [name] Medical record number: [id number] Date of birth: 31-Jul-1961 Age: 56 y.o. Gender: female  Primary Care Provider: Mayo, Pete Pelt, MD Consultants: None Code Status: DNR  Pt Overview and Major Events to Date:  Admitted to [facility] on 10/5  Assessment and Plan: Kathleen Roy is a 56 y.o. female presenting with AMS . PMH is significant for morbid obesity, MDD, chronic back pain, COPD, OSA, diastolic heart failure, HTN, T2DM, myoclonus, and immobility.   AMS Presented to ED via EMS for AMS x1 day. Patient last seen at baseline before going to sleep on 10/4. Patient's brother notes that patient was not oriented to place and was confused. Stated that patient was not arousable, and even attempted to slap patient to wake her up with no success. Likely 2/2 medication use. Patient is in charge of administering her own medications and has recently changed her pain medications. Per ED physician patient was somnolent but began to become more awake after a dose of narcan was administered. No recent fall so less likely due to trauma. ABG on admission showing acidosis with pH of 7.293 and elevated pCO2 to 89.0. Possible secondary respiratory acidosis leading to some AMS as well. CBG of 117, so unlikely related to hypoglycemia. UDS negative so unlikely 2/2 substance use. UA negative so unlikely related to UTI. Ammonia slightly elevated to 44, so unlikely to be main cause for AMS. CBC showing normal WBC count so less likely infectious in origin. TSH wnl. Patient this morning is AAOx3. States she remembers being very confused yesterday but is much more aware today. Patient is unsure how much pain medication she took prior to AMS.  -continuous cardiac monitoring -continuous pulse ox -NS @ 100 cc/hr -monitor blood culture   Hypercarbic Hypoxic Respiratory failure with Respiratory acidosis Arterial blood gas in ED  showing pH 7.293, pCO2 89.0, pO2 75.7, Bicarbonate of 42.5. Uncertain source of respiratory failure. Suspect possible opioid overdose given improvement with Narcan. Given pt deconditioned state, large body habitus and non-adherence to CPAP, she may have a chronic baseline respiratory acidosis. -continue to monitor -discontinue bipap -4L O2 -repeat ABG  Candidal infection Patient has history of candidal infection of left axilla. Home medication of nystatin powder.  -continue home medication  Chronic back pain History of chronic pain. Patient taking flexeril, percocet, and was prescribed opana but has not been taking.  -hold home meds while acutely altered, consider restarting once back to baseline mental status   COPD and OSA Chronic. Per patient's brother patient does not use home CPAP or bipap. Patient on 4L O2 at home  -albuterol 2 puff q6hrs prn  -breo 1 puff daily  -dulera 2 puff bid   Diastolic heart failure Chronic. Stable. Echo in 08/2016 showing EF of 60-65%. Home torsemide 40 mg bid. No signs of fluid overload on exam.  -monitor I&O -daily weights  -torsemide 40 mg bid   HTN Chronic. Stable. Home atorvastatin and torsemide -continue home medications   T2DM Chronic. Stable. No home medications. No signs of hypoglycemia on admission to support AMS. Most recent A1C on 10/6 of 4.9.  -monitor CBGs -moderate SSI  Depression Chronic. Stable. Home medications of venlafaxine and trazodone -hold home medications while acutely altered   Immobility  History of immobility. Per patient's brother patient is able to transfer from recliner to bedside commode but no other movements. Currently in the process of trying to get [place].  Anemia Hgb of 9.3, Hgb of 10.4 on presentation. Patient appears to be chronically anemic. Patient is asymptomatic.  -continue to monitor   FEN/GI: regular diet  Prophylaxis:  lovenox   Disposition: inpatient admission for  AMS  Subjective:  Patient today states she is feeling better. States she is aware that she was confused on admission but feels much better today. States she knew something was wrong because she slept all day. Patient denies SOB or chest pain. Patient states she is very thirsty but otherwise feeling well. Complains of some knee pain, but this is chronic. States that prior to AMS patient had no urinary symptoms. States some constipation due to pain medications.   Objective: Temp:  [96.2 F (35.7 C)] 96.2 F (35.7 C) (10/05 2002) Pulse Rate:  [76-117] 91 (10/06 0835) Resp:  [9-24] 16 (10/06 0835) BP: (101-140)/(47-117) 122/51 (10/06 0804) SpO2:  [89 %-100 %] 100 % (10/06 0835) FiO2 (%):  [50 %] 50 % (10/06 0835) Weight:  [374 lb 1.6 oz (169.7 kg)] 374 lb 1.6 oz (169.7 kg) (10/05 2029) Physical Exam: General: awake and alert, bipap on, oriented x3, NAD Cardiovascular: difficult exam due to body habitus, RRR, no MRG Respiratory: difficult exam due to body habitus, CTAB, normal work of breathing  Abdomen: soft, non tender, non distended, bowel sounds x 4 quadrants  Extremities: warm, diffuse non pitting edema Skin: warm, dry, erythema under L axilla, erythema to feet and lower legs bilaterally  Neuro: CN2-12 intact, no focal deficits, sensation intact bilaterally in face and all extremities, grip strength normal, able to follow commands, AAOx3  Laboratory:  Recent Labs Lab 09/19/17 2002 09/20/17 0410  WBC 6.6 6.5  HGB 10.4* 9.3*  HCT 37.0 32.5*  PLT 159 174    Recent Labs Lab 09/19/17 2002 09/20/17 0410  NA 142 142  K 5.1 4.4  CL 94* 94*  CO2 42* 39*  BUN 10 8  CREATININE 0.79 0.67  CALCIUM 9.1 9.1  PROT 7.3 6.9  BILITOT 0.6 0.6  ALKPHOS 120 104  ALT 13* 10*  AST 15 16  GLUCOSE 127* 76    Ref. Range 09/20/2017 04:10  TSH Latest Ref Range: 0.350 - 4.500 uIU/mL 0.424    Ref. Range 09/20/2017 04:10  Hemoglobin A1C Latest Ref Range: 4.8 - 5.6 % 4.9   CBG (last 3)    Recent Labs  09/19/17 1952 09/20/17 0421  GLUCAP 117* 73   Imaging/Diagnostic Tests: Dg Chest Port 1 View  Result Date: 09/19/2017 CLINICAL DATA:  56 y/o  F; altered mental status. EXAM: PORTABLE CHEST 1 VIEW COMPARISON:  11/26/2016 chest radiograph FINDINGS: Cardiomegaly. Patchy opacities throughout the lungs and prominent pulmonary markings. No pleural effusion or pneumothorax identified. Bones are unremarkable. IMPRESSION: Cardiomegaly. Diffuse patchy opacities throughout the lungs may represent alveolar pulmonary edema or multifocal pneumonia. Electronically Signed   By: Kristine Garbe M.D.   On: 09/19/2017 20:29    Caroline More, DO 09/20/2017, 9:18 AM PGY-1, [facility] Intern pager: [phone], text pages welcome

## 2017-09-21 LAB — CBC WITH DIFFERENTIAL/PLATELET
BASOS ABS: 0 10*3/uL (ref 0.0–0.1)
Basophils Relative: 0 %
Eosinophils Absolute: 0 10*3/uL (ref 0.0–0.7)
Eosinophils Relative: 0 %
HEMATOCRIT: 30.4 % — AB (ref 36.0–46.0)
HEMOGLOBIN: 8.9 g/dL — AB (ref 12.0–15.0)
LYMPHS ABS: 1.3 10*3/uL (ref 0.7–4.0)
LYMPHS PCT: 22 %
MCH: 28.1 pg (ref 26.0–34.0)
MCHC: 29.3 g/dL — ABNORMAL LOW (ref 30.0–36.0)
MCV: 95.9 fL (ref 78.0–100.0)
Monocytes Absolute: 0.5 10*3/uL (ref 0.1–1.0)
Monocytes Relative: 8 %
NEUTROS ABS: 4.3 10*3/uL (ref 1.7–7.7)
NEUTROS PCT: 70 %
PLATELETS: 176 10*3/uL (ref 150–400)
RBC: 3.17 MIL/uL — AB (ref 3.87–5.11)
RDW: 15.7 % — ABNORMAL HIGH (ref 11.5–15.5)
WBC: 6.2 10*3/uL (ref 4.0–10.5)

## 2017-09-21 LAB — BASIC METABOLIC PANEL
ANION GAP: 10 (ref 5–15)
BUN: 10 mg/dL (ref 6–20)
CALCIUM: 8.8 mg/dL — AB (ref 8.9–10.3)
CHLORIDE: 91 mmol/L — AB (ref 101–111)
CO2: 39 mmol/L — AB (ref 22–32)
CREATININE: 0.82 mg/dL (ref 0.44–1.00)
GFR calc Af Amer: >60 mL/min (ref >60)
GFR calc non Af Amer: >60 mL/min (ref >60)
Glucose, Bld: 110 mg/dL — ABNORMAL HIGH (ref 65–99)
POTASSIUM: 3.7 mmol/L (ref 3.5–5.1)
Sodium: 140 mmol/L (ref 135–145)

## 2017-09-21 LAB — BLOOD CULTURE ID PANEL (REFLEXED)
Acinetobacter baumannii: NOT DETECTED
CANDIDA KRUSEI: NOT DETECTED
CANDIDA PARAPSILOSIS: NOT DETECTED
CANDIDA TROPICALIS: NOT DETECTED
Candida albicans: NOT DETECTED
Candida glabrata: NOT DETECTED
ESCHERICHIA COLI: NOT DETECTED
Enterobacter cloacae complex: NOT DETECTED
Enterobacteriaceae species: NOT DETECTED
Enterococcus species: NOT DETECTED
HAEMOPHILUS INFLUENZAE: NOT DETECTED
KLEBSIELLA OXYTOCA: NOT DETECTED
KLEBSIELLA PNEUMONIAE: NOT DETECTED
LISTERIA MONOCYTOGENES: NOT DETECTED
Neisseria meningitidis: NOT DETECTED
PROTEUS SPECIES: NOT DETECTED
Pseudomonas aeruginosa: NOT DETECTED
SERRATIA MARCESCENS: NOT DETECTED
STAPHYLOCOCCUS AUREUS BCID: NOT DETECTED
STREPTOCOCCUS PYOGENES: NOT DETECTED
STREPTOCOCCUS SPECIES: NOT DETECTED
Staphylococcus species: NOT DETECTED
Streptococcus agalactiae: NOT DETECTED
Streptococcus pneumoniae: NOT DETECTED

## 2017-09-21 LAB — GLUCOSE, CAPILLARY
GLUCOSE-CAPILLARY: 122 mg/dL — AB (ref 65–99)
GLUCOSE-CAPILLARY: 102 mg/dL — AB (ref 65–99)
GLUCOSE-CAPILLARY: 137 mg/dL — AB (ref 65–99)
Glucose-Capillary: 71 mg/dL (ref 65–99)
Glucose-Capillary: 77 mg/dL (ref 65–99)
Glucose-Capillary: 94 mg/dL (ref 65–99)

## 2017-09-21 MED ORDER — PREGABALIN 75 MG PO CAPS
75.0000 mg | ORAL_CAPSULE | Freq: Two times a day (BID) | ORAL | Status: DC
  Administered 2017-09-21 – 2017-09-22 (×3): 75 mg via ORAL
  Filled 2017-09-21 (×3): qty 1

## 2017-09-21 MED ORDER — KETOCONAZOLE 2 % EX CREA
TOPICAL_CREAM | Freq: Every day | CUTANEOUS | Status: DC
  Administered 2017-09-21 – 2017-09-22 (×3): via TOPICAL
  Filled 2017-09-21: qty 15

## 2017-09-21 MED ORDER — VENLAFAXINE HCL ER 75 MG PO CP24
150.0000 mg | ORAL_CAPSULE | Freq: Every day | ORAL | Status: DC
  Administered 2017-09-21 – 2017-09-22 (×2): 150 mg via ORAL
  Filled 2017-09-21 (×3): qty 2

## 2017-09-21 MED ORDER — TRAZODONE HCL 50 MG PO TABS
50.0000 mg | ORAL_TABLET | Freq: Every day | ORAL | Status: DC
  Administered 2017-09-21: 50 mg via ORAL
  Filled 2017-09-21: qty 1

## 2017-09-21 MED ORDER — ARIPIPRAZOLE 2 MG PO TABS
2.0000 mg | ORAL_TABLET | ORAL | Status: DC
  Administered 2017-09-21: 2 mg via ORAL
  Filled 2017-09-21 (×2): qty 1

## 2017-09-21 NOTE — Progress Notes (Addendum)
CM sent message to T. Henderson, rep for [facility] to confirm services received for this pt pta. Will continue to follow. There is some mention of need for SNF at discharge 2/2 AMS on admission.  09:00: pt active with PT/OT/HHA and CSW pta

## 2017-09-21 NOTE — Progress Notes (Signed)
PHARMACY - PHYSICIAN COMMUNICATION CRITICAL VALUE ALERT - BLOOD CULTURE IDENTIFICATION (BCID)  Results for orders placed or performed during the hospital encounter of 09/19/17  Blood Culture ID Panel (Reflexed) (Collected: 09/19/2017 10:50 PM)  Result Value Ref Range   Enterococcus species NOT DETECTED NOT DETECTED   Listeria monocytogenes NOT DETECTED NOT DETECTED   Staphylococcus species NOT DETECTED NOT DETECTED   Staphylococcus aureus NOT DETECTED NOT DETECTED   Streptococcus species NOT DETECTED NOT DETECTED   Streptococcus agalactiae NOT DETECTED NOT DETECTED   Streptococcus pneumoniae NOT DETECTED NOT DETECTED   Streptococcus pyogenes NOT DETECTED NOT DETECTED   Acinetobacter baumannii NOT DETECTED NOT DETECTED   Enterobacteriaceae species NOT DETECTED NOT DETECTED   Enterobacter cloacae complex NOT DETECTED NOT DETECTED   Escherichia coli NOT DETECTED NOT DETECTED   Klebsiella oxytoca NOT DETECTED NOT DETECTED   Klebsiella pneumoniae NOT DETECTED NOT DETECTED   Proteus species NOT DETECTED NOT DETECTED   Serratia marcescens NOT DETECTED NOT DETECTED   Haemophilus influenzae NOT DETECTED NOT DETECTED   Neisseria meningitidis NOT DETECTED NOT DETECTED   Pseudomonas aeruginosa NOT DETECTED NOT DETECTED   Candida albicans NOT DETECTED NOT DETECTED   Candida glabrata NOT DETECTED NOT DETECTED   Candida krusei NOT DETECTED NOT DETECTED   Candida parapsilosis NOT DETECTED NOT DETECTED   Candida tropicalis NOT DETECTED NOT DETECTED   Patient is growing gram positive cocci and gram positive rods in the anaerobic bottle of 1 set of blood cultures.   Name of physician (or Provider) Contacted: Family Medicine Teaching Service   Changes to prescribed antibiotics required:  Discussed with family medicine teaching service. Patient has been well-appearing with no clinical signs of infection. Will hold off on antibiotics for now. Possible contaminated culture.   Susa Raring,  PharmD PGY2 Infectious Diseases Pharmacy Resident Phone: [phone] 09/21/2017  10:24 AM

## 2017-09-21 NOTE — Progress Notes (Signed)
Family Medicine Teaching Service Daily Progress Note Intern Pager: [phone]  [name] Medical record number: [id number] Date of birth: 1961/08/18 Age: 56 y.o. Gender: female  Primary Care Provider: Mayo, Pete Pelt, MD Consultants: None Code Status: DNR  Pt Overview and Major Events to Date:  Admitted to [facility] on 10/5  Assessment and Plan: Kathleen Roy is a 56 y.o. female presenting with AMS . PMH is significant for morbid obesity, MDD, chronic back pain, COPD, OSA, diastolic heart failure, HTN, T2DM, myoclonus, and immobility.   AMS: Resolved. Likely due to chronic hypercarbic respiratory failure. Patient with history of COPD, OSA & OHS. Poor compliance with home oxygen and CPAP. She is also on multiple sedating medication although UDS is negative in ED despite patient reporting taking Percocet. Blood culture with GPC in clusters in one out of 2. Rapid fire from the same bottle was negative. She has no fever or leukocytosis either. So infectious etiology unlikely. Today, she is AAO 4. Neuro exam within normal limits. Denies cardiopulmonary symptoms.  -Discontinue cardiac monitoring -Respiratory failure as below -Continuous pulse ox -Discontinue IV fluid -Monitor blood culture   Hypercarbic Hypoxic RF: patient with COPD, OSA & OHS. Supposedly on CPAP and 4 L by nasal cannula at home. History of nonadherence with CPAP and oxygen at home. Initial ABG was well compensated hypercarbia. Hypercarbia improved on repeat ABG but worsening hypoxemia at 41.9. She denies cardiopulmonary symptoms this morning. Nasal cannula over her forehead. She is satting mid 90s to upper 90s on room air. -Continue home albuterol, Breo-Ellipta -Continue to monitor -CPAP nightly. She is willing to try again. -Oxygen to keep saturation greater than 90%. High-level oxygen might depress respiratory drive   COPD/OSH/OSH: Aa above  Diastolic heart failure: Chronic. very hard to assess fluid status  given a morbid obesity. Doubt this is the cause of her respiratory failure. Echo in 08/2016 showing EF of 60-65%, G2DD. Home torsemide 40 mg bid. No urine output culture from last night. She has about 1.2 L since this morning. -Monitor I&O -Daily weights  -Torsemide 40 mg bid  - Replete potassium as needed  Chronic back pain: chronic. Patient taking flexeril & percocet. Recently stopped her Opana that she was prescribed by pain management clinic resume taking her Percocet 10/325. Surprisingly UDS negative. - Percocet 5/325 every 6 hours as needed  ? Neuropathic pain/fibromyalgia: Complains pain in both feets. She could not characterize her pain. -We will resume her pregabalin at 75 mg twice a day. She is on 150 mg twice a day at home.  Candidal infection: patient has history of candidal infection of left axilla. Home medication of nystatin powder.  -ketoconazole cream  HTN: chronic. Stable. On torsemide likely for edema -continue home torsemide  T2DM: Not sure about this diagnosis. Most recent A1C on 10/6 of 4.9. She has no prior A1c in the chart. Blood glucose has been about 100 She's not on medication.  -We will change CBG monitoring to twice a day -SSI-moderate  Depression/insomnia: chronic. Stable. On Abilify, venlafaxine and trazodone. Effexor is activating might worsen her insomnia -Resume home medications.  Immobility: Per patient's brother patient is able to transfer from recliner to bedside commode but no other movements. Currently in the process of trying to get [place].  -PT/OT eval  Anemia: Hemoglobin 8.9 this morning, 10.4 on arrival. Baseline appears to be about 9-10. MCV upper 90s to 100. -Check iron panel  FEN/GI: regular diet   Prophylaxis:  lovenox  Disposition: inpatient admission for AMS  Subjective:  Patient reports difficulty sleeping overnight. She reports discomfort with her bed. She also reports feet pain bilaterally. She could not characterize  the nature of her pain. She is on Lyrica at home. She also asked if she can get her trazodone. She reports not using CPAP due to discomfort. She is willing to try at night here.   Objective: Temp:  [98.1 F (36.7 C)-99.7 F (37.6 C)] 98.1 F (36.7 C) (10/07 0600) Pulse Rate:  [88-103] 88 (10/07 0600) Resp:  [16-21] 20 (10/07 0600) BP: (114-132)/(48-90) 132/57 (10/07 0600) SpO2:  [92 %-100 %] 97 % (10/07 0600) Physical Exam: General: Morbidly obese, lying on bed, no acute distress Cardiovascular: distant heart sounds due to body habitus Respiratory: Nasal cannula on her forehead, no increased work of brethinging, further exam limited due to body habitus Abdomen: Limited exam due to body habitus, soft, nontender, bowel sounds present Extremities: warm, diffuse non pitting edema Skin: warm, dry, erythema under L axilla Neuro: CN2-12 intact, no focal deficits, sensation intact bilaterally in face and all extremities, grip strength normal, able to follow commands, AAOx4  Laboratory:  Recent Labs Lab 09/19/17 2002 09/20/17 0410 09/21/17 0349  WBC 6.6 6.5 6.2  HGB 10.4* 9.3* 8.9*  HCT 37.0 32.5* 30.4*  PLT 159 174 176    Recent Labs Lab 09/19/17 2002 09/20/17 0410 09/21/17 0349  NA 142 142 140  K 5.1 4.4 3.7  CL 94* 94* 91*  CO2 42* 39* 39*  BUN 10 8 10   CREATININE 0.79 0.67 0.82  CALCIUM 9.1 9.1 8.8*  PROT 7.3 6.9  --   BILITOT 0.6 0.6  --   ALKPHOS 120 104  --   ALT 13* 10*  --   AST 15 16  --   GLUCOSE 127* 76 110*    Ref. Range 09/20/2017 04:10  TSH Latest Ref Range: 0.350 - 4.500 uIU/mL 0.424    Ref. Range 09/20/2017 04:10  Hemoglobin A1C Latest Ref Range: 4.8 - 5.6 % 4.9   CBG (last 3)   Recent Labs  09/21/17 0015 09/21/17 0423 09/21/17 0850  GLUCAP 71 94 77   Imaging/Diagnostic Tests: Dg Chest Port 1 View  Result Date: 09/19/2017 CLINICAL DATA:  56 y/o  F; altered mental status. EXAM: PORTABLE CHEST 1 VIEW COMPARISON:  11/26/2016 chest radiograph  FINDINGS: Cardiomegaly. Patchy opacities throughout the lungs and prominent pulmonary markings. No pleural effusion or pneumothorax identified. Bones are unremarkable. IMPRESSION: Cardiomegaly. Diffuse patchy opacities throughout the lungs may represent alveolar pulmonary edema or multifocal pneumonia. Electronically Signed   By: Kristine Garbe M.D.   On: 09/19/2017 20:29    Mercy Riding, MD 09/21/2017, 12:25 PM PGY-3, [place] Intern pager: [phone], text pages welcome

## 2017-09-21 NOTE — Evaluation (Signed)
Occupational Therapy Evaluation Patient Details Name: Kathleen Roy MRN: [id number] DOB: 02-13-1961 Today's Date: 09/21/2017    History of Present Illness 56 y.o. female presenting with AMS . PMH is significant for morbid obesity, MDD, chronic back pain, COPD, OSA, diastolic heart failure, HTN, T2DM, myoclonus, and immobility   Clinical Impression   Limited evaluation performed due to pt just finished getting cleaned up with RN staff. Pt reports she required assist for ADL at chair level PTA and only performed stand pivot transfers lift char <> BSC. Currently pt requires min assist for grooming and self feeding at bed level and max-total assist for all other ADL. Recommending SNF for follow up to maximize independence and safety with ADL and functional mobility. Pt would benefit from continued skilled OT to address established goals.    Follow Up Recommendations  SNF;Supervision/Assistance - 24 hour    Equipment Recommendations  Other (comment) (TBD at next venue)    Recommendations for Other Services       Precautions / Restrictions Precautions Precautions: Fall Restrictions Weight Bearing Restrictions: No      Mobility Bed Mobility Overal bed mobility: Needs Assistance             General bed mobility comments: Patient required mechanical features of bed to adjust positioning. Just finished getting cleaned up after BM with nursing.  Transfers                 General transfer comment: unsafe to assess    Balance                                           ADL either performed or assessed with clinical judgement   ADL Overall ADL's : Needs assistance/impaired                                       General ADL Comments: Pt able to complete self feeding and grooming at bed level with min assist, otherwise total assist for ADL. Pt just finished getting cleaned up with nursing requiring 5 people for bed mobility     Vision          Perception     Praxis      Pertinent Vitals/Pain Pain Assessment: 0-10 Pain Score: 10-Worst pain ever Pain Location: bil knees Pain Descriptors / Indicators: Aching;Grimacing Pain Intervention(s): Monitored during session;Limited activity within patient's tolerance     Hand Dominance Right   Extremity/Trunk Assessment Upper Extremity Assessment Upper Extremity Assessment: Generalized weakness   Lower Extremity Assessment Lower Extremity Assessment: Defer to PT evaluation   Cervical / Trunk Assessment Cervical / Trunk Assessment:  (morbidly obese)   Communication Communication Communication: No difficulties   Cognition Arousal/Alertness: Awake/alert Behavior During Therapy: WFL for tasks assessed/performed Overall Cognitive Status: Impaired/Different from baseline Area of Impairment: Awareness;Safety/judgement                         Safety/Judgement: Decreased awareness of safety;Decreased awareness of deficits Awareness: Emergent   General Comments: patient with poor insight into current deficits and abilities   General Comments       Exercises     Shoulder Instructions      Home Living Family/patient expects to be discharged to:: Private residence Living Arrangements: Other relatives (  brother) Available Help at Discharge: Family;Available 24 hours/day Type of Home: House Home Access: Ramped entrance     Home Layout: One level     Bathroom Shower/Tub: Teacher, early years/pre: Standard     Home Equipment: Bedside commode;Transport chair;Other (comment);Tub bench (Lift chair)          Prior Functioning/Environment Level of Independence: Needs assistance  Gait / Transfers Assistance Needed: stand pivot from lift chair to [facility] only ADL's / Homemaking Assistance Needed: assist for bathing a few times per week            OT Problem List: Decreased strength;Decreased range of motion;Decreased activity tolerance;Impaired  balance (sitting and/or standing);Decreased cognition;Decreased safety awareness;Decreased knowledge of use of DME or AE;Decreased knowledge of precautions;Obesity;Impaired UE functional use;Pain;Increased edema      OT Treatment/Interventions: Self-care/ADL training;Therapeutic exercise;Energy conservation;DME and/or AE instruction;Therapeutic activities;Patient/family education;Balance training    OT Goals(Current goals can be found in the care plan section) Acute Rehab OT Goals Patient Stated Goal: to go home OT Goal Formulation: With patient Time For Goal Achievement: 10/05/17 Potential to Achieve Goals: Good  OT Frequency: Min 2X/week   Barriers to D/C: Decreased caregiver support          Co-evaluation PT/OT/SLP Co-Evaluation/Treatment: Yes Reason for Co-Treatment: Complexity of the patient's impairments (multi-system involvement);Necessary to address cognition/behavior during functional activity;For patient/therapist safety PT goals addressed during session: Strengthening/ROM OT goals addressed during session: Strengthening/ROM      AM-PAC PT "6 Clicks" Daily Activity     Outcome Measure Help from another person eating meals?: A Little Help from another person taking care of personal grooming?: A Little Help from another person toileting, which includes using toliet, bedpan, or urinal?: Total Help from another person bathing (including washing, rinsing, drying)?: Total Help from another person to put on and taking off regular upper body clothing?: Total Help from another person to put on and taking off regular lower body clothing?: Total 6 Click Score: 10   End of Session Equipment Utilized During Treatment: Oxygen  Activity Tolerance: Patient limited by fatigue;Patient limited by pain Patient left: in bed;with call bell/phone within reach;with family/visitor present  OT Visit Diagnosis: Unsteadiness on feet (R26.81);Other abnormalities of gait and mobility  (R26.89);Muscle weakness (generalized) (M62.81);Pain                Time: 7680-8811 OT Time Calculation (min): 20 min Charges:  OT General Charges $OT Visit: 1 Visit OT Evaluation $OT Eval Moderate Complexity: 1 Mod G-Codes:     Marguetta Windish A. Ulice Brilliant, M.S., OTR/L Pager: [place] 09/21/2017, 4:29 PM

## 2017-09-21 NOTE — Progress Notes (Signed)
Pt CBG was low gave her orange juice and gram cracker rechecked after an hour  And was better will continue to monitor pt

## 2017-09-21 NOTE — ED Provider Notes (Signed)
I have personally seen and examined the patient.  I have discussed the plan of care with the resident.  I have reviewed the documentation on PMH/FH/Soc. History.  I have reviewed the documentation of the resident and agree.   EKG Interpretation  Date/Time:  Friday September 19 2017 20:04:40 EDT Ventricular Rate:  81 PR Interval:    QRS Duration: 150 QT Interval:  415 QTC Calculation: 482 R Axis:   -75 Text Interpretation:  Sinus rhythm Prolonged PR interval RBBB and LAFB Probable left ventricular hypertrophy No significant change since last tracing Confirmed by Lacretia Leigh ([id number]) on 09/20/2017 5:20:37 PM      ARTERIAL BLOOD GAS Date/Time: 09/21/2017 2:54 PM Performed by: Tyrone Nine Elford Evilsizer Authorized by: Deno Etienne   Consent:    Consent obtained:  Verbal   Consent given by:  Patient   Risks discussed:  Impaired circulation, bleeding and infection   Alternatives discussed:  No treatment and delayed treatment Anesthesia (see MAR for exact dosages):    Anesthesia method:  Local infiltration   Local anesthetic:  Lidocaine 1% WITH epi Procedure details:    Location:  R radial   Allen's test performed: yes     Allen's test abnormal: yes     Number of attempts:  1 Post-procedure details:    Dressing applied: yes     Bleeding:  Hemostasis achieved   Circulation, movement, and sensation:  Normal   Patient tolerance of procedure:  Tolerated well, no immediate complications   Procedure note: Ultrasound Guided Arterial stick Ultrasound guided arterial puncture performed by me. Indications: unsuccessful abg by RT. Details: The right wrist was evaluated with a multifrequency linear probe. Patent radial artery was noted. 1 attempt was made to puncture the artery under realtime US guidance with successful  return of pulsatile bright red blood. The patient tolerated the procedure well without complications. Images archived electronically.  CPT codes: [phone] and [phone]  56 yo F with AMS.  Likely  opoid overdose.  Improved significantly with narcan.  ? pna on CXR, started on abx.  VBG with significant respiratory acidosis, thought not much worse than her baseline.  Started on bipap.  Admit.   CRITICAL CARE Performed by: Cecilio Asper   Total critical care time: 80 minutes  Critical care time was exclusive of separately billable procedures and treating other patients.  Critical care was necessary to treat or prevent imminent or life-threatening deterioration.  Critical care was time spent personally by me on the following activities: development of treatment plan with patient and/or surrogate as well as nursing, discussions with consultants, evaluation of patient's response to treatment, examination of patient, obtaining history from patient or surrogate, ordering and performing treatments and interventions, ordering and review of laboratory studies, ordering and review of radiographic studies, pulse oximetry and re-evaluation of patient's condition.    Deno Etienne, DO 09/21/17 [phone]

## 2017-09-21 NOTE — Evaluation (Signed)
Physical Therapy Evaluation Patient Details Name: Kathleen Roy MRN: [id number] DOB: 1961/11/14 Today's Date: 09/21/2017   History of Present Illness  56 y.o. female presenting with AMS . PMH is significant for morbid obesity, MDD, chronic back pain, COPD, OSA, diastolic heart failure, HTN, T2DM, myoclonus, and immobility  Clinical Impression  Orders received for PT evaluation. Patient demonstrates deficits in functional mobility as indicated below. Will benefit from continued skilled PT to address deficits and maximize function. Will see as indicated and progress as tolerated.  OF NOTE: patient dependent for most activities at baseline, sleeps in power lift recliner and relies on power lift to transfer to [facility]. Patient reports that she does not bathe or changes out of her gown at home except when nursing aide comes 2x week. Patient does not wish to go to SNF, however, is not safe for return home and does not have adequate assist. (Brother present and has physical deficits as well). A this time, will trial acute PT.  Will request bariatric specialty left bed for use with patient and shuttle chair bariatric chair.    Follow Up Recommendations SNF    Equipment Recommendations       Recommendations for Other Services       Precautions / Restrictions Precautions Precautions: Fall Restrictions Weight Bearing Restrictions: No      Mobility  Bed Mobility Overal bed mobility: Needs Assistance             General bed mobility comments: Patient required mechanical features of bed to adjust positioning  Transfers                 General transfer comment: unsafe to assess  Ambulation/Gait                Stairs            Wheelchair Mobility    Modified Rankin (Stroke Patients Only)       Balance                                             Pertinent Vitals/Pain Pain Assessment: 0-10 Pain Score: 10-Worst pain ever Pain Location: bil  knees Pain Descriptors / Indicators: Aching;Grimacing Pain Intervention(s): Monitored during session;Limited activity within patient's tolerance;Patient requesting pain meds-RN notified    Home Living Family/patient expects to be discharged to:: Private residence Living Arrangements: Other relatives (brother) Available Help at Discharge: Family;Available 24 hours/day Type of Home: House Home Access: Ramped entrance     Home Layout: One level Home Equipment: Bedside commode;Transport chair;Other (comment);Tub bench (Lift chair)      Prior Function Level of Independence: Needs assistance   Gait / Transfers Assistance Needed: stand pivot from lift chair to [facility] only  ADL's / Homemaking Assistance Needed: assist for bathing a few times per week        Hand Dominance   Dominant Hand: Right    Extremity/Trunk Assessment   Upper Extremity Assessment Upper Extremity Assessment: Defer to OT evaluation    Lower Extremity Assessment Lower Extremity Assessment: Generalized weakness (significant deficits in strength and ROM, Significant edema )    Cervical / Trunk Assessment Cervical / Trunk Assessment:  (morbidly obese)  Communication   Communication: No difficulties  Cognition Arousal/Alertness: Awake/alert Behavior During Therapy: WFL for tasks assessed/performed Overall Cognitive Status: Impaired/Different from baseline Area of Impairment: Awareness;Safety/judgement  Safety/Judgement: Decreased awareness of safety;Decreased awareness of deficits Awareness: Emergent   General Comments: patient with poor insight into current deficits and abilities      General Comments      Exercises     Assessment/Plan    PT Assessment Patient needs continued PT services  PT Problem List Decreased strength;Decreased activity tolerance;Decreased balance;Decreased mobility;Obesity;Pain       PT Treatment Interventions Functional mobility  training;Therapeutic activities;Therapeutic exercise;Cognitive remediation;Patient/family education;Neuromuscular re-education;Balance training;Modalities    PT Goals (Current goals can be found in the Care Plan section)  Acute Rehab PT Goals Patient Stated Goal: to go home PT Goal Formulation: With patient Time For Goal Achievement: 10/05/17 Potential to Achieve Goals: Poor    Frequency Min 2X/week (will trial acute PT, better suited for rehab facility)   Barriers to discharge        Co-evaluation PT/OT/SLP Co-Evaluation/Treatment: Yes Reason for Co-Treatment: Complexity of the patient's impairments (multi-system involvement);Necessary to address cognition/behavior during functional activity;For patient/therapist safety PT goals addressed during session: Strengthening/ROM         AM-PAC PT "6 Clicks" Daily Activity  Outcome Measure Difficulty turning over in bed (including adjusting bedclothes, sheets and blankets)?: Unable Difficulty moving from lying on back to sitting on the side of the bed? : Unable Difficulty sitting down on and standing up from a chair with arms (e.g., wheelchair, bedside commode, etc,.)?: Unable Help needed moving to and from a bed to chair (including a wheelchair)?: Total Help needed walking in hospital room?: Total Help needed climbing 3-5 steps with a railing? : Total 6 Click Score: 6    End of Session Equipment Utilized During Treatment: Oxygen Activity Tolerance: Patient limited by pain;Patient limited by fatigue Patient left: in bed;with call bell/phone within reach;with family/visitor present Nurse Communication: Mobility status;Need for lift equipment PT Visit Diagnosis: Pain;Muscle weakness (generalized) (M62.81)    Time: 6606-3016 PT Time Calculation (min) (ACUTE ONLY): 17 min   Charges:   PT Evaluation $PT Eval Moderate Complexity: 1 Mod     PT G Codes:        Alben Deeds, PT DPT  Board Certified Neurologic  Specialist [place] 09/21/2017, 3:58 PM

## 2017-09-22 DIAGNOSIS — E662 Morbid (severe) obesity with alveolar hypoventilation: Secondary | ICD-10-CM

## 2017-09-22 DIAGNOSIS — Z23 Encounter for immunization: Secondary | ICD-10-CM | POA: Diagnosis not present

## 2017-09-22 LAB — CBC WITH DIFFERENTIAL/PLATELET
Basophils Absolute: 0 10*3/uL (ref 0.0–0.1)
Basophils Relative: 0 %
EOS PCT: 0 %
Eosinophils Absolute: 0 10*3/uL (ref 0.0–0.7)
HEMATOCRIT: 29.2 % — AB (ref 36.0–46.0)
HEMOGLOBIN: 8.9 g/dL — AB (ref 12.0–15.0)
LYMPHS PCT: 27 %
Lymphs Abs: 1.6 10*3/uL (ref 0.7–4.0)
MCH: 28.2 pg (ref 26.0–34.0)
MCHC: 30.5 g/dL (ref 30.0–36.0)
MCV: 92.4 fL (ref 78.0–100.0)
MONO ABS: 0.5 10*3/uL (ref 0.1–1.0)
MONOS PCT: 8 %
NEUTROS PCT: 65 %
Neutro Abs: 3.7 10*3/uL (ref 1.7–7.7)
PLATELETS: 201 10*3/uL (ref 150–400)
RBC: 3.16 MIL/uL — AB (ref 3.87–5.11)
RDW: 15.4 % (ref 11.5–15.5)
WBC: 5.8 10*3/uL (ref 4.0–10.5)

## 2017-09-22 LAB — BLOOD GAS, ARTERIAL
Acid-Base Excess: 19.5 mmol/L — ABNORMAL HIGH (ref 0.0–2.0)
BICARBONATE: 45 mmol/L — AB (ref 20.0–28.0)
DRAWN BY: 283381
O2 CONTENT: 4 L/min
O2 Saturation: 79.7 %
PATIENT TEMPERATURE: 98.6
pCO2 arterial: 63.7 mmHg — ABNORMAL HIGH (ref 32.0–48.0)
pH, Arterial: 7.462 — ABNORMAL HIGH (ref 7.350–7.450)
pO2, Arterial: 41.9 mmHg — ABNORMAL LOW (ref 83.0–108.0)

## 2017-09-22 LAB — BASIC METABOLIC PANEL
ANION GAP: 10 (ref 5–15)
BUN: 11 mg/dL (ref 6–20)
CO2: 34 mmol/L — ABNORMAL HIGH (ref 22–32)
Calcium: 8.8 mg/dL — ABNORMAL LOW (ref 8.9–10.3)
Chloride: 97 mmol/L — ABNORMAL LOW (ref 101–111)
Creatinine, Ser: 0.81 mg/dL (ref 0.44–1.00)
GFR calc Af Amer: >60 mL/min (ref >60)
GFR calc non Af Amer: >60 mL/min (ref >60)
GLUCOSE: 93 mg/dL (ref 65–99)
POTASSIUM: 3.9 mmol/L (ref 3.5–5.1)
Sodium: 141 mmol/L (ref 135–145)

## 2017-09-22 LAB — IRON AND TIBC
Iron: 31 ug/dL (ref 28–170)
Saturation Ratios: 14 % (ref 10.4–31.8)
TIBC: 221 ug/dL — ABNORMAL LOW (ref 250–450)
UIBC: 190 ug/dL

## 2017-09-22 LAB — LIPID PANEL
CHOL/HDL RATIO: 2.9 ratio
Cholesterol: 111 mg/dL (ref 0–200)
HDL: 38 mg/dL — AB (ref >40)
LDL CALC: 46 mg/dL (ref 0–99)
TRIGLYCERIDES: 133 mg/dL (ref <150)
VLDL: 27 mg/dL (ref 0–40)

## 2017-09-22 LAB — GLUCOSE, CAPILLARY
GLUCOSE-CAPILLARY: 91 mg/dL (ref 65–99)
Glucose-Capillary: 102 mg/dL — ABNORMAL HIGH (ref 65–99)
Glucose-Capillary: 117 mg/dL — ABNORMAL HIGH (ref 65–99)
Glucose-Capillary: 129 mg/dL — ABNORMAL HIGH (ref 65–99)
Glucose-Capillary: 132 mg/dL — ABNORMAL HIGH (ref 65–99)

## 2017-09-22 LAB — RETICULOCYTES
RBC.: 3.32 MIL/uL — ABNORMAL LOW (ref 3.87–5.11)
Retic Count, Absolute: 102.9 10*3/uL (ref 19.0–186.0)
Retic Ct Pct: 3.1 % (ref 0.4–3.1)

## 2017-09-22 LAB — VITAMIN B12: VITAMIN B 12: 332 pg/mL (ref 180–914)

## 2017-09-22 LAB — FOLATE: Folate: 11.2 ng/mL (ref >5.9)

## 2017-09-22 LAB — FERRITIN: Ferritin: 90 ng/mL (ref 11–307)

## 2017-09-22 MED ORDER — ENOXAPARIN SODIUM 100 MG/ML ~~LOC~~ SOLN: 0.5000 mg/kg | SUBCUTANEOUS | Status: DC

## 2017-09-22 NOTE — Progress Notes (Signed)
RT went to put CPAP on PT wanted to eat a snack and was not ready for BiPap.  PT will notify RN when ready to be placed on CPAP.

## 2017-09-22 NOTE — Discharge Instructions (Signed)
You were admitted for altered mental status. In order to help prevent this in the future please use your oxygen  Please follow up with Dr. Ky Barban at 10/11 @ 11am (please arrive 15 min early).   If you develop symptoms of confusion, shortness of breath, or chest pain please call your PCP or come to the emergency room.

## 2017-09-22 NOTE — Progress Notes (Signed)
PCP Social Note: Spoke with patient over the phone. She states she was very scared by everything that had happened to her. She has decided to stop taking the Opana, because she thinks it is too dangerous. She is going to be discharged today. She has everything she needs at home and she is anxious to get back.  Appreciate the care provided by Dr. Tammi Klippel and the rest of the family medicine inpatient team.  Hyman Bible, MD

## 2017-09-22 NOTE — Consult Note (Addendum)
   [facility] CM Inpatient Consult   09/22/2017  BRIYANNA BILLINGHAM 1961-10-17 [id number]  Patient is currently active with [facility] for chronic disease management services in the [facility].    Patient has been engaged by a [employer] and CSW.  Our community based plan of care has focused on disease management and community resource support.  Patient has been admitted with Altered Mental Status, HX of COPD and HF.  Patient is currently being recommended for skill nursing facility at discharge, will continue to follow for progress and disposition needs.  Met with patient and her brother at the bedside and the plan is to return home with home health care.   They consent to ongoing [facility] for post hospital follow up.  Consent on file. A brochure with 24 hour nurse advise line and contact information was given.  Made Inpatient Case Manager aware that [facility] following. Of note, [facility] Care Management services does not replace or interfere with any services that are needed or arranged by inpatient case management or social work.  For additional questions or referrals please contact:   Natividad Brood, RN BSN [facility] Liaison  [phone] business mobile phone Toll free office [phone]

## 2017-09-22 NOTE — Progress Notes (Signed)
Family Medicine Teaching Service Daily Progress Note Intern Pager: [phone]  [name] Medical record number: [id number] Date of birth: 27-Jul-1961 Age: 56 y.o. Gender: female  Primary Care Provider: Mayo, Pete Pelt, MD Consultants: None Code Status: DNR  Pt Overview and Major Events to Date:  Admitted to [facility] on 10/5  Assessment and Plan: Kathleen Roy a 56 y.o.femalepresenting with AMS. PMH is significant for morbid obesity, MDD, chronic back pain, COPD, OSA, diastolic heart failure, HTN, T2DM, myoclonus, and immobility.   AMS-resolved Likely due to chronic hypercarbic respiratory failure. Patient with history of COPD, OSA & OHS. Poor compliance with home oxygen and CPAP. She is also on multiple sedating medication although UDS is negative in ED despite patient reporting taking Percocet. Blood culture with GPC in clusters in one out of 2. Rapid fire from the same bottle was negative. She has no fever or leukocytosis either. So infectious etiology unlikely. Today, she is AAO 4. Neuro exam within normal limits. Denies cardiopulmonary symptoms. Blood cultures NTD -Respiratory failure as below -Continuous pulse ox -Monitor blood culture   Hypercarbic Hypoxic RF patient with COPD, OSA & OHS. Supposedly on CPAP and 4L by nasal cannula at home. History of nonadherence with CPAP and oxygen at home. Initial ABG was well compensated hypercarbia. Hypercarbia improved on repeat ABG but worsening hypoxemia at 41.9. She denies cardiopulmonary symptoms this morning. O2 saturation of 96% on 3L O2. Patient states that she removes O2 at home in her sleep.  -Continue home albuterol, Breo-Ellipta -Continue to monitor -CPAP nightly. Patient stated that she is unable to afford as an outpatient and does not tolerate it well.  -Oxygen to keep saturation greater than 90%. High-level oxygen might depress respiratory drive   COPD/OSH/OSH -plan as above  Diastolic heart  failure Chronic. very hard to assess fluid status given a morbid obesity. Doubt this is the cause of her respiratory failure. Echo in 08/2016 showing EF of 60-65%, G2DD. Home torsemide 40 mg bid. No urine output culture from last night. She has about 1.2 L since this morning. -Monitor I&O -Daily weights  -Torsemide 40 mg bid  - Replete potassium as needed  Chronic back pain chronic. Patient taking flexeril & percocet. Recently stopped her Opana that she was prescribed by pain management clinic resume taking her Percocet 10/325. Surprisingly UDS negative. - Percocet 5/325 every 6 hours as needed  Neuropathic pain/fibromyalgia Complains pain in both feets. She could not characterize her pain. -We will resume her pregabalin at 75 mg twice a day. She is on 150 mg twice a day at home.  Candidal infection patient has history of candidal infection of left axilla. Home medication of nystatin powder.  -ketoconazole cream  HTN chronic. Stable. On torsemide likely for edema -continue home torsemide  T2DM Not sure about this diagnosis. Most recent A1C on 10/6 of 4.9. She has no prior A1c in the chart. Blood glucose has been about 100. No home medications.  -CBG monitoring to twice a day -SSI-moderate  Depression/insomnia chronic. Stable. On Abilify, venlafaxine and trazodone. Effexor is activating might worsen her insomnia -Resume home medications.  Immobility Per patient's brother patient is able to transfer from recliner to bedside commode but no other movements. Currently in the process of trying to get [facility]. PT and OT recommendations for SNF placement. -consult SW for SNF placement   Anemia Hemoglobin 8.9 this morning, 10.4 on arrival. Baseline appears to be about 9-10. MCV upper 90s to 100. -monitor iron panel -  consider outpatient f/u with PCP  FEN/GI: regular diet PPx: lovenox   Disposition: home with home health   Subjective:  Patient today states she is improved.  States she didn't sleep well last night because she needs trazodone to sleep. States she is unable to use CPAP at home for cost issue and states she cannot sleep with CPAP on due to comfort. Patient states she tolerates O2 but takes off her [place] in her sleep.   Objective: Temp:  [97.9 F (36.6 C)-98.6 F (37 C)] 97.9 F (36.6 C) (10/08 0414) Pulse Rate:  [81-93] 81 (10/08 0414) Resp:  [20] 20 (10/08 0414) BP: (115-129)/(54-65) 129/65 (10/08 0414) SpO2:  [96 %] 96 % (10/08 0414) Physical Exam: General: obese female, awake and alert, NAD, laying in bed  Cardiovascular: exam limited due to body habitus, RRR, no MRG Respiratory: exam limited due to body  Habitus, CTAB Abdomen: soft, non tender, non distended, bowel sounds x 4 quadrants  Extremities: non pitting edema Skin: erythema to axilla bilaterally (but improved), erythema of legs bilaterally Neuro: CN 2-12 intact, sensation intact bilaterally, 5/5 muscle strength bilaterally, AAOx4   Laboratory:  Recent Labs Lab 09/20/17 0410 09/21/17 0349 09/22/17 0532  WBC 6.5 6.2 5.8  HGB 9.3* 8.9* 8.9*  HCT 32.5* 30.4* 29.2*  PLT 174 176 201    Recent Labs Lab 09/19/17 2002 09/20/17 0410 09/21/17 0349 09/22/17 0532  NA 142 142 140 141  K 5.1 4.4 3.7 3.9  CL 94* 94* 91* 97*  CO2 42* 39* 39* 34*  BUN 10 8 10 11   CREATININE 0.79 0.67 0.82 0.81  CALCIUM 9.1 9.1 8.8* 8.8*  PROT 7.3 6.9  --   --   BILITOT 0.6 0.6  --   --   ALKPHOS 120 104  --   --   ALT 13* 10*  --   --   AST 15 16  --   --   GLUCOSE 127* 76 110* 93    Imaging/Diagnostic Tests: Dg Chest Port 1 View  Result Date: 09/19/2017 CLINICAL DATA:  56 y/o  F; altered mental status. EXAM: PORTABLE CHEST 1 VIEW COMPARISON:  11/26/2016 chest radiograph FINDINGS: Cardiomegaly. Patchy opacities throughout the lungs and prominent pulmonary markings. No pleural effusion or pneumothorax identified. Bones are unremarkable. IMPRESSION: Cardiomegaly. Diffuse patchy opacities  throughout the lungs may represent alveolar pulmonary edema or multifocal pneumonia. Electronically Signed   By: Kristine Garbe M.D.   On: 09/19/2017 20:29    Caroline More, DO 09/22/2017, 12:21 PM PGY-1, [place] Intern pager: [phone], text pages welcome

## 2017-09-22 NOTE — Progress Notes (Signed)
CSW received consult regarding PT recommendation of SNF at discharge.  Patient is refusing SNF. She stated she had been to snf for months and is ready to stay at home with her brother for a while. Patient would like [facility]. RNCM aware.  CSW signing off.   Percell Locus Joaopedro Eschbach LCSWA [phone]

## 2017-09-23 ENCOUNTER — Telehealth: Payer: Self-pay | Admitting: Internal Medicine

## 2017-09-23 ENCOUNTER — Encounter: Payer: Self-pay | Admitting: *Deleted

## 2017-09-23 ENCOUNTER — Other Ambulatory Visit: Payer: Self-pay | Admitting: Internal Medicine

## 2017-09-23 ENCOUNTER — Other Ambulatory Visit: Payer: Self-pay | Admitting: *Deleted

## 2017-09-23 MED ORDER — OXYCODONE-ACETAMINOPHEN 10-325 MG PO TABS: ORAL_TABLET | ORAL | 0 refills | Status: DC

## 2017-09-23 NOTE — Telephone Encounter (Signed)
Kathleen Bailey, RN with [facility] called stating that paper work will be sent to PCP regarding placement for rehab.  Patient is requesting rehab, she is having a hard time at home, unable to stand on her own. Please give her a call at [phone].  Derl Barrow, RN

## 2017-09-23 NOTE — Patient Outreach (Signed)
[facility]) Care Management  09/23/2017  Kathleen Roy May 30, 1961 [id number]   CSW received a call from patient today requesting that Kelly assist her with placement into a skilled nursing facility, as soon as possible, as patient reports not being able to stand at all, wanting short-term rehabilitative services.  HIPAA compliant identifiers met, with patient's name and date of birth.  CSW is aware that patient was recently hospitalized from October 5th through October 8th, due to AMS and Tremors.  Patient refused skilled nursing placement for rehabilitative services, upon discharge from the hospital.  After patient conversed with her Primary Care Physician, Dr. Hyman Bible, patient is aware that it may take several days for CSW to initiate skilled nursing placement from home.  CSW explained to patient that patient's insurance company, [employer], may require that patient receive rehabilitative services in the home, prior to paying for patient to be placed in a short-term rehab facility.  Patient was not at all agreeable to this plan, reporting, "I can't stand at all by myself". CSW will place a call to Dr. Brett Albino requesting that she complete a new FL-2 Form on patient.  Once the FL-2 Form is received, CSW will fax to patient's facilities of choice, which include [facility] and [facility].  CSW is hopeful that [employer] will agree to skilled nursing placement, as patient has already met the three day inpatient hospital qualifying stay through Medicare.  CSW agreed to follow-up with patient as soon as CSW is able to report progress toward placement. [name], [name], [name], [name]  Licensed Education officer, [facility]  Mailing [place] [address] [place], [name] [address] Physical Address-[address], [place], [place] [address] Toll Free Main # [phone] Fax  # [phone] Cell # [phone]  Office # [phone] [email or web address]

## 2017-09-23 NOTE — Telephone Encounter (Signed)
Pt is calling and would like to have orders to be put back in some type of rehab. She is having some problems getting into one. Please call to discuss. jw

## 2017-09-23 NOTE — Telephone Encounter (Signed)
Called Alice back and left a voicemail. I have also called Di Kindle, who is the patient's social worker through [facility] and I have left her a message. We are working on getting patient placed at a SNF. I explained to patient's brother today that it is much more difficult to get patient placed at a SNF from an outpatient setting than it is from an inpatient setting. I explained to him that it will take time to get her placed. We are doing everything we can to get patient placed and I am having multiple social workers try to help me with this. This is not something that will happen right away.

## 2017-09-23 NOTE — Telephone Encounter (Signed)
Alan Mulder., Social Worker with [facility] left voice message on nurse line requesting skill nursing placement for patient. Please call [phone] ext [phone].  Derl Barrow, RN

## 2017-09-24 ENCOUNTER — Observation Stay (HOSPITAL_COMMUNITY)
Admission: EM | Admit: 2017-09-24 | Discharge: 2017-09-25 | Disposition: A | Discharge status: Skilled Nursing Facility | Payer: Medicare Other | Attending: Family Medicine | Admitting: Family Medicine

## 2017-09-24 ENCOUNTER — Other Ambulatory Visit: Payer: Self-pay

## 2017-09-24 ENCOUNTER — Emergency Department (HOSPITAL_COMMUNITY): Payer: Medicare Other

## 2017-09-24 ENCOUNTER — Other Ambulatory Visit: Payer: Self-pay | Admitting: *Deleted

## 2017-09-24 ENCOUNTER — Encounter (HOSPITAL_COMMUNITY): Payer: Self-pay | Admitting: Emergency Medicine

## 2017-09-24 DIAGNOSIS — R739 Hyperglycemia, unspecified: Secondary | ICD-10-CM | POA: Diagnosis not present

## 2017-09-24 DIAGNOSIS — M549 Dorsalgia, unspecified: Secondary | ICD-10-CM | POA: Insufficient documentation

## 2017-09-24 DIAGNOSIS — I110 Hypertensive heart disease with heart failure: Secondary | ICD-10-CM | POA: Insufficient documentation

## 2017-09-24 DIAGNOSIS — E785 Hyperlipidemia, unspecified: Secondary | ICD-10-CM | POA: Insufficient documentation

## 2017-09-24 DIAGNOSIS — Z79899 Other long term (current) drug therapy: Secondary | ICD-10-CM | POA: Insufficient documentation

## 2017-09-24 DIAGNOSIS — Z6841 Body Mass Index (BMI) 40.0 and over, adult: Secondary | ICD-10-CM | POA: Insufficient documentation

## 2017-09-24 DIAGNOSIS — I5032 Chronic diastolic (congestive) heart failure: Secondary | ICD-10-CM | POA: Diagnosis not present

## 2017-09-24 DIAGNOSIS — E1142 Type 2 diabetes mellitus with diabetic polyneuropathy: Secondary | ICD-10-CM | POA: Diagnosis not present

## 2017-09-24 DIAGNOSIS — Z741 Need for assistance with personal care: Secondary | ICD-10-CM | POA: Diagnosis not present

## 2017-09-24 DIAGNOSIS — E876 Hypokalemia: Secondary | ICD-10-CM | POA: Insufficient documentation

## 2017-09-24 DIAGNOSIS — E662 Morbid (severe) obesity with alveolar hypoventilation: Secondary | ICD-10-CM | POA: Diagnosis present

## 2017-09-24 DIAGNOSIS — E119 Type 2 diabetes mellitus without complications: Secondary | ICD-10-CM

## 2017-09-24 DIAGNOSIS — F329 Major depressive disorder, single episode, unspecified: Secondary | ICD-10-CM | POA: Insufficient documentation

## 2017-09-24 DIAGNOSIS — G253 Myoclonus: Secondary | ICD-10-CM | POA: Diagnosis not present

## 2017-09-24 DIAGNOSIS — Z90710 Acquired absence of both cervix and uterus: Secondary | ICD-10-CM | POA: Diagnosis not present

## 2017-09-24 DIAGNOSIS — B372 Candidiasis of skin and nail: Secondary | ICD-10-CM | POA: Diagnosis present

## 2017-09-24 DIAGNOSIS — I872 Venous insufficiency (chronic) (peripheral): Secondary | ICD-10-CM | POA: Diagnosis not present

## 2017-09-24 DIAGNOSIS — J9611 Chronic respiratory failure with hypoxia: Secondary | ICD-10-CM | POA: Diagnosis present

## 2017-09-24 DIAGNOSIS — R531 Weakness: Principal | ICD-10-CM | POA: Insufficient documentation

## 2017-09-24 DIAGNOSIS — I452 Bifascicular block: Secondary | ICD-10-CM | POA: Insufficient documentation

## 2017-09-24 DIAGNOSIS — M797 Fibromyalgia: Secondary | ICD-10-CM | POA: Diagnosis not present

## 2017-09-24 DIAGNOSIS — Z66 Do not resuscitate: Secondary | ICD-10-CM | POA: Diagnosis not present

## 2017-09-24 DIAGNOSIS — R5383 Other fatigue: Secondary | ICD-10-CM | POA: Diagnosis not present

## 2017-09-24 DIAGNOSIS — E1165 Type 2 diabetes mellitus with hyperglycemia: Secondary | ICD-10-CM | POA: Diagnosis not present

## 2017-09-24 DIAGNOSIS — M1990 Unspecified osteoarthritis, unspecified site: Secondary | ICD-10-CM | POA: Insufficient documentation

## 2017-09-24 DIAGNOSIS — D649 Anemia, unspecified: Secondary | ICD-10-CM | POA: Diagnosis not present

## 2017-09-24 DIAGNOSIS — Z87891 Personal history of nicotine dependence: Secondary | ICD-10-CM | POA: Insufficient documentation

## 2017-09-24 DIAGNOSIS — B3789 Other sites of candidiasis: Secondary | ICD-10-CM | POA: Diagnosis not present

## 2017-09-24 DIAGNOSIS — G8929 Other chronic pain: Secondary | ICD-10-CM | POA: Diagnosis not present

## 2017-09-24 DIAGNOSIS — Z9981 Dependence on supplemental oxygen: Secondary | ICD-10-CM | POA: Insufficient documentation

## 2017-09-24 DIAGNOSIS — Z886 Allergy status to analgesic agent status: Secondary | ICD-10-CM | POA: Insufficient documentation

## 2017-09-24 DIAGNOSIS — J449 Chronic obstructive pulmonary disease, unspecified: Secondary | ICD-10-CM | POA: Insufficient documentation

## 2017-09-24 DIAGNOSIS — R404 Transient alteration of awareness: Secondary | ICD-10-CM | POA: Diagnosis not present

## 2017-09-24 HISTORY — DX: Dependence on supplemental oxygen: Z99.81

## 2017-09-24 HISTORY — DX: Type 2 diabetes mellitus without complications: E11.9

## 2017-09-24 HISTORY — DX: Heart failure, unspecified: I50.9

## 2017-09-24 LAB — COMPREHENSIVE METABOLIC PANEL
ALT: 12 U/L — ABNORMAL LOW (ref 14–54)
AST: 15 U/L (ref 15–41)
Albumin: 3.2 g/dL — ABNORMAL LOW (ref 3.5–5.0)
Alkaline Phosphatase: 92 U/L (ref 38–126)
Anion gap: 9 (ref 5–15)
BUN: 14 mg/dL (ref 6–20)
CHLORIDE: 97 mmol/L — AB (ref 101–111)
CO2: 34 mmol/L — AB (ref 22–32)
Calcium: 9.1 mg/dL (ref 8.9–10.3)
Creatinine, Ser: 0.84 mg/dL (ref 0.44–1.00)
Glucose, Bld: 204 mg/dL — ABNORMAL HIGH (ref 65–99)
POTASSIUM: 3.2 mmol/L — AB (ref 3.5–5.1)
SODIUM: 140 mmol/L (ref 135–145)
Total Bilirubin: 0.5 mg/dL (ref 0.3–1.2)
Total Protein: 6.7 g/dL (ref 6.5–8.1)

## 2017-09-24 LAB — RAPID URINE DRUG SCREEN, HOSP PERFORMED
AMPHETAMINES: NOT DETECTED
BARBITURATES: NOT DETECTED
BENZODIAZEPINES: NOT DETECTED
COCAINE: NOT DETECTED
Opiates: NOT DETECTED
TETRAHYDROCANNABINOL: NOT DETECTED

## 2017-09-24 LAB — CULTURE, BLOOD (ROUTINE X 2)
Culture: NO GROWTH
Special Requests: ADEQUATE

## 2017-09-24 LAB — CBC WITH DIFFERENTIAL/PLATELET
Basophils Absolute: 0 10*3/uL (ref 0.0–0.1)
Basophils Relative: 0 %
Eosinophils Absolute: 0 10*3/uL (ref 0.0–0.7)
Eosinophils Relative: 0 %
HEMATOCRIT: 32.1 % — AB (ref 36.0–46.0)
HEMOGLOBIN: 9.5 g/dL — AB (ref 12.0–15.0)
LYMPHS ABS: 1.1 10*3/uL (ref 0.7–4.0)
Lymphocytes Relative: 21 %
MCH: 27.9 pg (ref 26.0–34.0)
MCHC: 29.6 g/dL — ABNORMAL LOW (ref 30.0–36.0)
MCV: 94.1 fL (ref 78.0–100.0)
Monocytes Absolute: 0.3 10*3/uL (ref 0.1–1.0)
Monocytes Relative: 6 %
NEUTROS PCT: 73 %
Neutro Abs: 3.9 10*3/uL (ref 1.7–7.7)
Platelets: 178 10*3/uL (ref 150–400)
RBC: 3.41 MIL/uL — AB (ref 3.87–5.11)
RDW: 15.8 % — ABNORMAL HIGH (ref 11.5–15.5)
WBC: 5.3 10*3/uL (ref 4.0–10.5)

## 2017-09-24 LAB — URINALYSIS, ROUTINE W REFLEX MICROSCOPIC
BILIRUBIN URINE: NEGATIVE
GLUCOSE, UA: NEGATIVE mg/dL
HGB URINE DIPSTICK: NEGATIVE
KETONES UR: NEGATIVE mg/dL
LEUKOCYTES UA: NEGATIVE
Nitrite: NEGATIVE
PH: 7 (ref 5.0–8.0)
Protein, ur: NEGATIVE mg/dL
Specific Gravity, Urine: 1.014 (ref 1.005–1.030)

## 2017-09-24 LAB — CK: Total CK: 34 U/L — ABNORMAL LOW (ref 38–234)

## 2017-09-24 LAB — ETHANOL: Alcohol, Ethyl (B): <10 mg/dL (ref <10)

## 2017-09-24 LAB — I-STAT CG4 LACTIC ACID, ED: Lactic Acid, Venous: 1.94 mmol/L — ABNORMAL HIGH (ref 0.5–1.9)

## 2017-09-24 LAB — AMMONIA: Ammonia: 24 umol/L (ref 9–35)

## 2017-09-24 LAB — CBG MONITORING, ED
Glucose-Capillary: 79 mg/dL (ref 65–99)
Glucose-Capillary: 109 mg/dL — ABNORMAL HIGH (ref 65–99)

## 2017-09-24 LAB — GLUCOSE, CAPILLARY
GLUCOSE-CAPILLARY: 138 mg/dL — AB (ref 65–99)
Glucose-Capillary: 98 mg/dL (ref 65–99)

## 2017-09-24 MED ORDER — TORSEMIDE 20 MG PO TABS
40.0000 mg | ORAL_TABLET | Freq: Two times a day (BID) | ORAL | Status: DC
  Administered 2017-09-24 – 2017-09-25 (×2): 40 mg via ORAL
  Filled 2017-09-24 (×3): qty 2

## 2017-09-24 MED ORDER — TRAZODONE HCL 50 MG PO TABS
50.0000 mg | ORAL_TABLET | Freq: Every evening | ORAL | Status: DC | PRN
  Administered 2017-09-25: 50 mg via ORAL
  Filled 2017-09-24: qty 1

## 2017-09-24 MED ORDER — LORATADINE 10 MG PO TABS
10.0000 mg | ORAL_TABLET | Freq: Every day | ORAL | Status: DC
  Administered 2017-09-24 – 2017-09-25 (×2): 10 mg via ORAL
  Filled 2017-09-24 (×2): qty 1

## 2017-09-24 MED ORDER — ATORVASTATIN CALCIUM 10 MG PO TABS
10.0000 mg | ORAL_TABLET | Freq: Every day | ORAL | Status: DC
  Administered 2017-09-24: 10 mg via ORAL
  Filled 2017-09-24: qty 1

## 2017-09-24 MED ORDER — FLUTICASONE FUROATE-VILANTEROL 100-25 MCG/INH IN AEPB
1.0000 | INHALATION_SPRAY | Freq: Every day | RESPIRATORY_TRACT | Status: DC
  Administered 2017-09-25: 1 via RESPIRATORY_TRACT
  Filled 2017-09-24: qty 28

## 2017-09-24 MED ORDER — VENLAFAXINE HCL ER 150 MG PO CP24
300.0000 mg | ORAL_CAPSULE | Freq: Every day | ORAL | Status: DC
Start: 2017-09-24 — End: 2017-09-25
  Administered 2017-09-24 – 2017-09-25 (×2): 300 mg via ORAL
  Filled 2017-09-24 (×2): qty 2

## 2017-09-24 MED ORDER — ENOXAPARIN SODIUM 80 MG/0.8ML ~~LOC~~ SOLN
80.0000 mg | SUBCUTANEOUS | Status: DC
  Administered 2017-09-24: 80 mg via SUBCUTANEOUS
  Filled 2017-09-24: qty 0.8

## 2017-09-24 MED ORDER — CYCLOBENZAPRINE HCL 10 MG PO TABS: 10.0000 mg | ORAL_TABLET | Freq: Three times a day (TID) | ORAL | Status: DC | PRN

## 2017-09-24 MED ORDER — ALBUTEROL SULFATE (2.5 MG/3ML) 0.083% IN NEBU: 2.5000 mg | INHALATION_SOLUTION | Freq: Four times a day (QID) | RESPIRATORY_TRACT | Status: DC | PRN

## 2017-09-24 MED ORDER — POLYETHYLENE GLYCOL 3350 17 G PO PACK
17.0000 g | PACK | Freq: Every day | ORAL | Status: DC
  Administered 2017-09-25: 17 g via ORAL
  Filled 2017-09-24: qty 1

## 2017-09-24 MED ORDER — ENOXAPARIN SODIUM 40 MG/0.4ML ~~LOC~~ SOLN: 40.0000 mg | SUBCUTANEOUS | Status: DC

## 2017-09-24 MED ORDER — OXYCODONE HCL 5 MG PO TABS
5.0000 mg | ORAL_TABLET | Freq: Four times a day (QID) | ORAL | Status: DC | PRN
  Administered 2017-09-24 – 2017-09-25 (×2): 5 mg via ORAL
  Filled 2017-09-24 (×2): qty 1

## 2017-09-24 MED ORDER — NYSTATIN 100000 UNIT/GM EX POWD
CUTANEOUS | Status: DC | PRN
  Filled 2017-09-24: qty 15

## 2017-09-24 MED ORDER — PREGABALIN 75 MG PO CAPS
150.0000 mg | ORAL_CAPSULE | Freq: Two times a day (BID) | ORAL | Status: DC
  Administered 2017-09-24 – 2017-09-25 (×2): 150 mg via ORAL
  Filled 2017-09-24 (×2): qty 2

## 2017-09-24 MED ORDER — ARIPIPRAZOLE 2 MG PO TABS
2.0000 mg | ORAL_TABLET | ORAL | Status: DC
  Administered 2017-09-25: 2 mg via ORAL
  Filled 2017-09-24: qty 1

## 2017-09-24 MED ORDER — ALBUTEROL SULFATE HFA 108 (90 BASE) MCG/ACT IN AERS: 2.0000 | INHALATION_SPRAY | Freq: Four times a day (QID) | RESPIRATORY_TRACT | Status: DC | PRN

## 2017-09-24 MED ORDER — POTASSIUM CHLORIDE CRYS ER 20 MEQ PO TBCR: 40.0000 meq | EXTENDED_RELEASE_TABLET | Freq: Once | ORAL | Status: DC

## 2017-09-24 MED ORDER — POTASSIUM CHLORIDE CRYS ER 20 MEQ PO TBCR
40.0000 meq | EXTENDED_RELEASE_TABLET | Freq: Once | ORAL | Status: AC
  Administered 2017-09-24: 40 meq via ORAL
  Filled 2017-09-24: qty 2

## 2017-09-24 MED ORDER — OXYCODONE-ACETAMINOPHEN 10-325 MG PO TABS: 1.0000 | ORAL_TABLET | Freq: Four times a day (QID) | ORAL | Status: DC | PRN

## 2017-09-24 MED ORDER — OXYCODONE-ACETAMINOPHEN 5-325 MG PO TABS
1.0000 | ORAL_TABLET | Freq: Four times a day (QID) | ORAL | Status: DC | PRN
  Administered 2017-09-24 – 2017-09-25 (×2): 1 via ORAL
  Filled 2017-09-24 (×2): qty 1

## 2017-09-24 MED ORDER — MOMETASONE FURO-FORMOTEROL FUM 100-5 MCG/ACT IN AERO: 2.0000 | INHALATION_SPRAY | Freq: Two times a day (BID) | RESPIRATORY_TRACT | Status: DC

## 2017-09-24 MED ORDER — INSULIN ASPART 100 UNIT/ML ~~LOC~~ SOLN: 0.0000 [IU] | Freq: Three times a day (TID) | SUBCUTANEOUS | Status: DC

## 2017-09-24 NOTE — Telephone Encounter (Signed)
This encounter was created in error - please disregard.

## 2017-09-24 NOTE — Progress Notes (Signed)
Patient active w [facility], whose [place] SW recommended patient be placed at [facility]. Patient recently discharged to home w [facility] services after refusing SNF placement at DC. Dr Brett Albino worked w [facility] on outpatient basis to admit patient to SNF, patient presents need for SNF placement for weakness  as evidenced by inability to get out of recliner at home, per notes.

## 2017-09-24 NOTE — ED Notes (Signed)
CBG taken at 11:58 was 79.

## 2017-09-24 NOTE — ED Notes (Signed)
Gave patient some water patient is resting with call bell in reach 

## 2017-09-24 NOTE — ED Provider Notes (Signed)
[facility] DEPT Provider Note   CSN: [id number] Arrival date & time: 09/24/17  0247     History   Chief Complaint Chief Complaint  Patient presents with  . Generalized Weakness    HPI Kathleen Roy is a 56 y.o. female.  The history is provided by the patient.  Weakness  This is a recurrent problem. The current episode started more than 2 days ago. The problem has been gradually worsening. There was no focality noted. There has been no fever. Pertinent negatives include no chest pain, no vomiting and no headaches.  Patient with h/o morbid obesity, fibromyalgia, chronic pain, hypertension presents with generalized weakness She reports she was just released from hospital She reports she didn't realize how weak she was feeling when she was discharged After arriving home she required 2 paramedics and 5 firemen just for her to be placed in her home Since going home she has been unable to get around and has to urinate on herself She reports prior to recent admission she could at least pivot in bed and use the bathroom However after hospital stay she feels globally weak She wears 4L O2 at home  Past Medical History:  Diagnosis Date  . Arthritis    "I feel like it's everywhere" (11/08/2014)  . Cellulitis and abscess of lower extremity 08/16/2016   right leg  . Chronic lower back pain   . COPD (chronic obstructive pulmonary disease) ([place])   . Depression   . Diabetes mellitus without complication ([place])    type 2   . Fibromyalgia   . Headache    "at least weekly; it's usually when I first wake up" (11/08/2014)  . Hyperlipidemia   . Hypertension   . HYPERTENSION, BENIGN SYSTEMIC 02/12/2007  . Memory disturbance   . Migraines 1970's - <2000   "they just went away"  . Neuropathy   . Normal echocardiogram 05/30/05   suboptimal study  . Obesity, morbid (more than 100 lbs over ideal weight or BMI > 40) (HCC)    obese since childhood  . Osteoarthritis   . Peripheral edema   .  Pressure ulcer of foot    left  . Sepsis ([place])   . Shortness of breath dyspnea   . Sleep apnea    "recently dx'd; haven't got my equipment yet" (11/08/2014)    Patient Active Problem List   Diagnosis Date Noted  . Altered mental status 09/20/2017  . 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 Allergic rhinitis 08/15/2017  . Immobility 08/15/2017  . Fibromyalgia 06/16/2017  . Chronic pain of both knees   . Acute respiratory failure with hypercapnia ([place]) 10/15/2016  . Type 2 diabetes mellitus ([place]) 02/14/2016  . Chronic diastolic congestive heart failure ([place])   . Essential hypertension   . OSA (obstructive sleep apnea)   . Obesity hypoventilation syndrome ([place])   . Chronic respiratory failure with hypoxia ([place])   . Memory loss 03/18/2014  . Venous stasis dermatitis of both lower extremities 12/02/2012  . COPD (chronic obstructive pulmonary disease) ([place]) 10/15/2011  . Peripheral neuropathy ([place]) 10/15/2011  . BACK PAIN, LUMBAR, CHRONIC 01/22/2011  . Morbid obesity ([place]) 11/16/2007  . Major depressive disorder, recurrent episode ([place]) 02/12/2007    Past Surgical History:  Procedure Laterality Date  . ABDOMINAL HERNIA REPAIR  2002  . APPENDECTOMY  1995  . CHOLECYSTECTOMY  1995  . TOTAL ABDOMINAL HYSTERECTOMY  1994    OB History    Gravida Para Term Preterm AB Living   0 0  0 0 0 0   SAB TAB Ectopic Multiple Live Births   0 0 0 0         Home Medications    Prior to Admission medications   Medication Sig Start Date End Date Taking? Authorizing Provider  albuterol (PROAIR HFA) 108 (90 Base) MCG/ACT inhaler Inhale 2 puffs into the lungs every 6 (six) hours as needed for wheezing or shortness of breath.   Yes [provider]  ARIPiprazole (ABILIFY) 2 MG tablet Take 1 tablet (2 mg total) by mouth every other day. 12/18/16  Yes Mayo, Pete Pelt, MD  Ascorbic Acid (VITAMIN C) 1000 MG tablet Take 1,000 mg by mouth 3 (three) times daily.    Yes [provider]  atorvastatin  (LIPITOR) 10 MG tablet Take 1 tablet (10 mg total) by mouth at bedtime. 03/27/17  Yes Mayo, Pete Pelt, MD  BREO ELLIPTA 100-25 MCG/INH AEPB Inhale 1 puff into the lungs daily. 07/24/17  Yes [provider]  cetirizine (ZYRTEC) 10 MG tablet Take 1 tablet (10 mg total) by mouth daily. 08/15/17  Yes Mayo, Pete Pelt, MD  cyclobenzaprine (FLEXERIL) 10 MG tablet Take 1 tablet (10 mg total) by mouth 3 (three) times daily as needed for muscle spasms. 12/18/16  Yes Mayo, Pete Pelt, MD  Fluticasone-Salmeterol (ADVAIR) 100-50 MCG/DOSE AEPB Inhale 1 puff into the lungs 2 (two) times daily.   Yes [provider]  naloxone (NARCAN) nasal spray 4 mg/0.1 mL Place 1 spray into the nose once as needed (opiod overdose).   Yes [provider]  nystatin (NYSTATIN) powder Apply topically See admin instructions. Apply topically to affected area as needed for rash/itching   Yes [provider]  Omega-3 Fatty Acids (FISH OIL) 1000 MG CAPS Take 2 capsules (2,000 mg total) by mouth at bedtime. 06/10/16  Yes Mayo, Pete Pelt, MD  oxyCODONE-acetaminophen [facility]) 10-325 MG tablet Take one tablet by mouth every 6 hours as needed for pain 09/23/17  Yes Mayo, Pete Pelt, MD  polyethylene glycol [facility] / GLYCOLAX) packet Take 17 g by mouth daily. Mix in 8 oz liquid and drink   Yes [provider]  potassium chloride SA (K-DUR,KLOR-CON) 20 MEQ tablet Take 2 tablets once a day. Patient taking differently: Take 40 mEq by mouth daily.  03/04/17  Yes Mayo, Pete Pelt, MD  pregabalin (LYRICA) 150 MG capsule Take 1 capsule (150 mg total) by mouth 2 (two) times daily. 07/28/17  Yes Gerlene Fee, NP  torsemide (DEMADEX) 20 MG tablet Take 40 mg by mouth 2 (two) times daily.   Yes [provider]  traZODone (DESYREL) 50 MG tablet Take 50 mg by mouth at bedtime as needed. 07/13/17  Yes [provider]  venlafaxine XR (EFFEXOR-XR) 150 MG 24 hr capsule Take 300 mg by mouth daily.    Yes  [provider]  OXYGEN Inhale 4 L into the lungs continuous.     [provider]    Family History Family History  Problem Relation Age of Onset  . Stroke Mother   . Dementia Father   . Prostate cancer Father   . Congestive Heart Failure Brother   . Diabetes Brother   . Hypertension Brother     Social History Social History  Substance Use Topics  . Smoking status: Former Smoker    Packs/day: 1.00    Years: 30.00    Types: Cigarettes    Quit date: 01/05/2009  . Smokeless tobacco: Never Used  . Alcohol  use No     Allergies   Aspirin   Review of Systems Review of Systems  Constitutional: Negative for fever.  Respiratory:       Chronic shortness of breath   Cardiovascular: Negative for chest pain.  Gastrointestinal: Negative for vomiting.  Neurological: Positive for weakness. Negative for headaches.  All other systems reviewed and are negative.    Physical Exam Updated Vital Signs BP 122/81   Pulse 82   Temp 98.4 F (36.9 C) (Rectal)   Resp 19   SpO2 99%   Physical Exam CONSTITUTIONAL: disheveled, no acute distress HEAD: Normocephalic/atraumatic EYES: EOMI/PERRL ENMT: Mucous membranes dry NECK: supple no meningeal signs SPINE/BACK:entire spine nontender CV: S1/S2 noted  LUNGS: Lungs are clear to auscultation bilaterally,limited due to body size ABDOMEN: soft, nontender, obese GU:no erythema/bruising noted, nurse present for exam NEURO: Pt is awake/alert/appropriate, she can move upper extremities.  She has significant limitation in movement of legs but she can wiggles toes. EXTREMITIES: pulses normal/equal, full ROM, chronic pitting edema to lower extremities, no deformities noted SKIN: warm, color normal PSYCH: no abnormalities of mood noted, alert and oriented to situation   ED Treatments / Results  Labs (all labs ordered are listed, but only abnormal results are displayed) Labs Reviewed  COMPREHENSIVE METABOLIC PANEL -  Abnormal; Notable for the following:       Result Value   Potassium 3.2 (*)    Chloride 97 (*)    CO2 34 (*)    Glucose, Bld 204 (*)    Albumin 3.2 (*)    ALT 12 (*)    All other components within normal limits  CBC WITH DIFFERENTIAL/PLATELET - Abnormal; Notable for the following:    RBC 3.41 (*)    Hemoglobin 9.5 (*)    HCT 32.1 (*)    MCHC 29.6 (*)    RDW 15.8 (*)    All other components within normal limits  CK - Abnormal; Notable for the following:    Total CK 34 (*)    All other components within normal limits  I-STAT CG4 LACTIC ACID, ED - Abnormal; Notable for the following:    Lactic Acid, Venous 1.94 (*)    All other components within normal limits  AMMONIA  RAPID URINE DRUG SCREEN, HOSP PERFORMED  ETHANOL  URINALYSIS, ROUTINE W REFLEX MICROSCOPIC    EKG  EKG Interpretation  Date/Time:  Wednesday September 24 2017 02:52:17 EDT Ventricular Rate:  92 PR Interval:    QRS Duration: 149 QT Interval:  399 QTC Calculation: 494 R Axis:   -77 Text Interpretation:  Sinus rhythm Borderline prolonged PR interval RBBB and LAFB Left ventricular hypertrophy No significant change since last tracing Abnormal ekg Confirmed by Ripley Fraise ([id number]) on 09/24/2017 3:06:02 AM       Radiology Dg Chest Port 1 View  Result Date: 09/24/2017 CLINICAL DATA:  Weakness for 1 day EXAM: PORTABLE CHEST 1 VIEW COMPARISON:  09/19/2017 FINDINGS: There is moderate cardiomegaly, unchanged. There is moderate vascular and interstitial prominence. No confluent airspace consolidation. No large effusion. IMPRESSION: Cardiomegaly and moderate vascular prominence. No confluent consolidation. No effusion. Electronically Signed   By: [name] M.D.   On: 09/24/2017 03:38    Procedures Procedures (including critical care time)  Medications Ordered in ED Medications - No data to display   Initial Impression / Assessment and Plan / ED Course  I have reviewed the triage vital signs and the  nursing notes.  Pertinent labs & imaging results that were  available during my care of the patient were reviewed by me and considered in my medical decision making (see chart for details).     Pt with recent admission for AMS, respiratory failure that was thought to be due to opana use Since returning home she feels generalized weakness and deconditioning She is now requesting rehab placement which she had previously refused  Will admit D/w family medicine resident for admission   Final Clinical Impressions(s) / ED Diagnoses   Final diagnoses:  Weakness  Hyperglycemia    New Prescriptions New Prescriptions   No medications on file     Ripley Fraise, MD 09/24/17 [phone]

## 2017-09-24 NOTE — Patient Outreach (Signed)
[facility]) Care Management  09/24/17  THEDA PAYER [birth year] [phone]  RNCM to reach out to patient today for transition of care, however, patient has been sent back to the emergency room. Patient was discharged to home on 09/22/2017 with recommendations for SNF placement but she declined. At home, she was deconditioned and unable to transfer out of her recliner at home. She is now requesting SNF placement.    RNCM notified hospital liaisons about patient's return to hospital today.  [place] R. Saamir Armstrong, RN, BSN, [place] Management Coordinator [phone]

## 2017-09-24 NOTE — Progress Notes (Signed)
Refuses CPAP. No unit in room. Will continue to monitor.

## 2017-09-24 NOTE — H&P (Signed)
[facility] Admission History and Physical Service Pager: [phone]  [name] Medical record number: [id number] Date of birth: 03-Sep-1961 Age: 56 y.o. Gender: female  Primary Care Provider: Mayo, Pete Pelt, MD Consultants: None Code Status: DNR (confirmed on admission)  Chief Complaint: weakness  Assessment and Plan: Kathleen Roy is a 56 y.o. female presenting with weakness. PMH is significant for morbid obesity, MDD, chronic back pain (previously on opana), COPD, OSA (not on CPAP at home), diastolic heart failure (EF 60-65%, G2DD), HTN, T2DM, myoclonus, and immobility.   Weakness In setting of recent hospitalization for hypercarbic hypoxic respiratory failure from possible opioid overdose vs chronic baseline acidosis 2/2 nonadherence to CPAP and OHS. Declined SNF upon discharge 09/22/17 but now does not feel safe to be home. Low suspicion for infection as afebrile and WBC 5.3; UA negative for nitrites/leukocytes. Denies CP or increased dyspnea. CXR without infiltrate. EKG with RBBB and LAFB and evidence of LVH; unchanged from previous. LA < 2.0. Tired but easy to awaken and answering all questions appropriately. Denies falls. Suspect worsening of chronic deconditioning after hospitalization. 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 UDS negative and denies drug use aside from prescribed oxycodone. Offered to have patient's PCP arrange SNF placement outpatient, but patient did not feel safe, as she cannot even lift her legs.  -Admit to observation, attending Dr. McDiarmid -Consult PT/OT to work with patient -Consult Social Work for SNF placement -Monitor electrolytes and respiratory status -Obtain ABG if any decline in level of alertness  Chronic back pain -Ordered home percocet 10-325 mg q6h prn and  flexeril 10 mg TID prn as not altered -miralax daily  COPD and OSA Chronic. Does not use CPAP at home due to cost concerns per patient. No wheezing on today's exam. AOx3 but would obtain ABG if becomes less alert.  -albuterol 2 puff q6hrs prn  -breo 1 puff daily  -dulera 2 puff bid  -CPAP QHS (can refuse)  Diastolic heart failure Chronic. Stable. Echo in 08/2016 showing EF of 60-65%. Weight 374 lb 09/19/17. Does not know "dry weight," as does not have scale at home. Saturating 100% on her home 4L O2 and lungs CTAB. -monitor I&O -daily weights  -torsemide 40 mg bid  -obtain BNP if weight up  Hypokalemia: K 3.2  - kdur 40 mEq - BMP daily  HTN Chronic. Stable. Home atorvastatin and torsemide -continue home medications   T2DM Chronic. Stable. No home medications. A1c 4.9 09/20/17, but CBG 204 on admission.  -monitor CBGs BID -moderate SSI  Depression Chronic. Stable.  -Continue home medications of venlafaxine and trazodone  Immobility  History of immobility. Worsened after recent hospitalization. - PT/OT consults as above  Neuropathic pain/fibromyalgia Chronic with decreased sensation of LEs.  -Continue home lyrica 150 mg BID  Candidal infection Chronic -Continue home nystatin powder  Anemia hgb 9.5 (BL~9-10). Folate and B12 WNL last admission. Ferritin WNL and iron at low range of normal at 31. Chronic - Consider iron supplementation, though already with chronic constipation  FEN/GI: Heart healthy/carb modified Prophylaxis:  lovenox   Disposition: Admit for safety concerns while await SNF placement  History of Present Illness:  Kathleen Roy is a 56 y.o. female presenting with inability to ambulate since being discharged from hospital 09/22/17 after admission for AMS 2/2 hypercarbic respiratory failure. She says she felt she was  ready to go home, but when she got there, she required 7 people to help her get into bed. She was too weak to be able to use her  bedside commode. She also notes extreme fatigue--she attributes this to sleeping about 2-3 hours a night while recently hospitalized. Prior to hospitalization, she was able to use a "helper chair" to get out of bed then could use walker to get pivot to get to bedside commode. SNF placement had been recommended, but patient declined, hoping she could manage with home health care services and her brother's assistance. She had been at [facility] about 4 months earlier this year. She denies increased shortness of breath, but says her oxygen was only on 2L instead of her regular 4L when she was brought home. She worries because she often takes her O2 off in her sleep. She does not use a CPAP, stating cost as the barrier. She has been taking her medications since discharge except for torsemide, as she knew she could not get to the bathroom.   Review Of Systems: Per HPI with the following additions:   Review of Systems  Constitutional: Negative for chills and fever.  HENT: Negative for congestion and sore throat.   Eyes: Negative for blurred vision and double vision.  Respiratory: Negative for cough and shortness of breath.   Cardiovascular: Negative for chest pain and palpitations.  Gastrointestinal: Positive for constipation. Negative for abdominal pain, nausea and vomiting.  Genitourinary: Negative for dysuria and frequency.  Musculoskeletal: Positive for back pain. Negative for falls.  Skin: Positive for rash.  Neurological: Positive for weakness. Negative for dizziness, focal weakness and headaches.    Patient Active Problem List   Diagnosis Date Noted  . Altered mental status 09/20/2017  . 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 Allergic rhinitis 08/15/2017  . Immobility 08/15/2017  . Fibromyalgia 06/16/2017  . Chronic pain of both knees   . Acute respiratory failure with hypercapnia ([place]) 10/15/2016  . Type 2 diabetes mellitus ([place]) 02/14/2016  . Chronic diastolic congestive heart failure ([place])   .  Essential hypertension   . OSA (obstructive sleep apnea)   . Obesity hypoventilation syndrome ([place])   . Chronic respiratory failure with hypoxia ([place])   . Memory loss 03/18/2014  . Venous stasis dermatitis of both lower extremities 12/02/2012  . COPD (chronic obstructive pulmonary disease) ([place]) 10/15/2011  . Peripheral neuropathy ([place]) 10/15/2011  . BACK PAIN, LUMBAR, CHRONIC 01/22/2011  . Morbid obesity ([place]) 11/16/2007  . Major depressive disorder, recurrent episode ([place]) 02/12/2007    Past Medical History: Past Medical History:  Diagnosis Date  . Arthritis    "I feel like it's everywhere" (11/08/2014)  . Cellulitis and abscess of lower extremity 08/16/2016   right leg  . Chronic lower back pain   . COPD (chronic obstructive pulmonary disease) ([place])   . Depression   . Diabetes mellitus without complication ([place])    type 2   . Fibromyalgia   . Headache    "at least weekly; it's usually when I first wake up" (11/08/2014)  . Hyperlipidemia   . Hypertension   . HYPERTENSION, BENIGN SYSTEMIC 02/12/2007  . Memory disturbance   . Migraines 1970's - <2000   "they just went away"  . Neuropathy   . Normal echocardiogram 05/30/05   suboptimal study  . Obesity, morbid (more than 100 lbs over ideal weight or BMI > 40) (HCC)    obese since childhood  . Osteoarthritis   . Peripheral edema   .  Pressure ulcer of foot    left  . Sepsis ([place])   . Shortness of breath dyspnea   . Sleep apnea    "recently dx'd; haven't got my equipment yet" (11/08/2014)    Past Surgical History: Past Surgical History:  Procedure Laterality Date  . ABDOMINAL HERNIA REPAIR  2002  . APPENDECTOMY  1995  . CHOLECYSTECTOMY  1995  . TOTAL ABDOMINAL HYSTERECTOMY  1994    Social History: Social History  Substance Use Topics  . Smoking status: Former Smoker    Packs/day: 1.00    Years: 30.00    Types: Cigarettes    Quit date: 01/05/2009  . Smokeless tobacco: Never Used  . Alcohol use No    Additional social history: Lives with brother at her home. He is her caretaker. Denies alcohol and illegal drug use. Former smoker. Please also refer to relevant sections of EMR.  Family History: Family History  Problem Relation Age of Onset  . Stroke Mother   . Dementia Father   . Prostate cancer Father   . Congestive Heart Failure Brother   . Diabetes Brother   . Hypertension Brother     Allergies and Medications: Allergies  Allergen Reactions  . Aspirin Hives    REACTION: hives Hasn't had in years    No current facility-administered medications on file prior to encounter.    Current Outpatient Prescriptions on File Prior to Encounter  Medication Sig Dispense Refill  . albuterol (PROAIR HFA) 108 (90 Base) MCG/ACT inhaler Inhale 2 puffs into the lungs every 6 (six) hours as needed for wheezing or shortness of breath.    . ARIPiprazole (ABILIFY) 2 MG tablet Take 1 tablet (2 mg total) by mouth every other day. 45 tablet 0  . Ascorbic Acid (VITAMIN C) 1000 MG tablet Take 1,000 mg by mouth 3 (three) times daily.     Marland Kitchen atorvastatin (LIPITOR) 10 MG tablet Take 1 tablet (10 mg total) by mouth at bedtime. 90 tablet 0  . BREO ELLIPTA 100-25 MCG/INH AEPB Inhale 1 puff into the lungs daily.    . cetirizine (ZYRTEC) 10 MG tablet Take 1 tablet (10 mg total) by mouth daily. 90 tablet 2  . cyclobenzaprine (FLEXERIL) 10 MG tablet Take 1 tablet (10 mg total) by mouth 3 (three) times daily as needed for muscle spasms. 90 tablet 2  . Fluticasone-Salmeterol (ADVAIR) 100-50 MCG/DOSE AEPB Inhale 1 puff into the lungs 2 (two) times daily.    . naloxone (NARCAN) nasal spray 4 mg/0.1 mL Place 1 spray into the nose once as needed (opiod overdose).    Marland Kitchen nystatin (NYSTATIN) powder Apply topically See admin instructions. Apply topically to affected area as needed for rash/itching    . Omega-3 Fatty Acids (FISH OIL) 1000 MG CAPS Take 2 capsules (2,000 mg total) by mouth at bedtime. 60 capsule 2  .  oxyCODONE-acetaminophen (PERCOCET) 10-325 MG tablet Take one tablet by mouth every 6 hours as needed for pain 180 tablet 0  . polyethylene glycol (MIRALAX / GLYCOLAX) packet Take 17 g by mouth daily. Mix in 8 oz liquid and drink    . potassium chloride SA (K-DUR,KLOR-CON) 20 MEQ tablet Take 2 tablets once a day. (Patient taking differently: Take 40 mEq by mouth daily. ) 180 tablet 0  . pregabalin (LYRICA) 150 MG capsule Take 1 capsule (150 mg total) by mouth 2 (two) times daily. 60 capsule 0  . torsemide (DEMADEX) 20 MG tablet Take 40 mg by mouth 2 (two) times daily.    Marland Kitchen  traZODone (DESYREL) 50 MG tablet Take 50 mg by mouth at bedtime as needed.    . venlafaxine XR (EFFEXOR-XR) 150 MG 24 hr capsule Take 300 mg by mouth daily.     . OXYGEN Inhale 4 L into the lungs continuous.       Objective: BP 122/81   Pulse 82   Temp 98.4 F (36.9 C) (Rectal)   Resp 19   SpO2 99%  Exam: General: Morbidly obese female in NAD, sleeping in bed with supplemental O2 in place Eyes: PERRL, EOMI, wearing glasses ENTM: Poor dentition. MMM.  Neck: FROM, supple. Could not assess for JVD due to habitus.  Cardiovascular: Difficult to auscultate heart sounds due to habitus but RRR. Could not appreciate DP, PT pulses but feet warm and well perfused.  Respiratory: CTAB. No increased work of breathing. Able to speak in complete sentences.  Gastrointestinal: soft, NT; decreased BS MSK: No LE edema. Legs symmetric. LE strength 1/5--only trembling appreciated. Midline spinal TTP over lumbar spine (chronic).  Derm: Erythema under left axilla and of pannus. Patches of erythema over dorsal surface of feet and bilateral shins with woody changes.  Neuro: Patient endorses decreased sensation over legs and feet (chronic). AOx3. Grip strength 5/5. UE strength 5/5. Dorsi and plantar flexion of feet 4+/5 bilaterally.  Psych: Normal mood and affect. Though some avoidance of eye contact.   Labs and Imaging: CBC BMET   Recent  Labs Lab 09/24/17 0303  WBC 5.3  HGB 9.5*  HCT 32.1*  PLT 178    Recent Labs Lab 09/24/17 0303  NA 140  K 3.2*  CL 97*  CO2 34*  BUN 14  CREATININE 0.84  GLUCOSE 204*  CALCIUM 9.1     Dg Chest Port 1 View  Result Date: 09/24/2017 CLINICAL DATA:  Weakness for 1 day EXAM: PORTABLE CHEST 1 VIEW COMPARISON:  09/19/2017 FINDINGS: There is moderate cardiomegaly, unchanged. There is moderate vascular and interstitial prominence. No confluent airspace consolidation. No large effusion. IMPRESSION: Cardiomegaly and moderate vascular prominence. No confluent consolidation. No effusion. Electronically Signed   By: [name] M.D.   On: 09/24/2017 03:38   Rogue Bussing, MD 09/24/2017, 7:52 AM PGY-3, [place] Intern pager: [phone], text pages welcome

## 2017-09-24 NOTE — Progress Notes (Signed)
Patient has orders for CPAP at night. Patient says she does not want to wear one she would rather just wear 02. Patient is on 4LNC with 02 saturations at 97%. Will continue to monitor.

## 2017-09-24 NOTE — ED Triage Notes (Addendum)
Patient arrived with EMS from home reports generalized weakness onset yesterday after discharge from hospital , pt. stated that she is too weak to stand up from her chair to transfer to her bedside commode . Pt. Is morbidly obese /also reports chronic bilateral knee pain for several years . Pt. Stated that she can normally transfer from her chair to commode with minimal assistance .

## 2017-09-24 NOTE — Patient Outreach (Signed)
[facility]) Care Management  09/24/2017  Kathleen Roy 02-16-1961 [id number]   CSW noted that patient was brought to the [facility] Emergency Department by [facility] this morning, with complaints of generalized weakness and inability to stand in order to transfer from her chair to the bedside commode.  As a result, patient admits to having to urinate on herself due to being completely wheelchair bound.  Patient suffers from morbid obesity and chronic bilateral knee pain, which prevents her from being able to perform activities of daily living independently, relying solely on her brother, Jonae Renshaw, with whom patient currently resides.   CSW has placed an In [employer] in EPIC to [employer] and [place] with [facility], requesting that placement be initiated for patient in a skilled nursing facility for short-term rehabilitative services.  CSW will continue to follow patient while hospitalized to assess and assist with discharge planning needs and services.  An inpatient hospital social work consult will need to be initiated for patient while hospitalized to assist with placement. [name], [name], [name], [name]  Licensed Education officer, [facility]  [place] [address], [place], [place] [address] Physical Address-[address], [place], [place] [address] Toll Free Main # [phone] Fax # [phone] Cell # [phone]  Office # [phone] [name]

## 2017-09-25 ENCOUNTER — Inpatient Hospital Stay: Payer: Medicare Other | Admitting: Family Medicine

## 2017-09-25 DIAGNOSIS — R5383 Other fatigue: Secondary | ICD-10-CM | POA: Diagnosis not present

## 2017-09-25 DIAGNOSIS — Z79899 Other long term (current) drug therapy: Secondary | ICD-10-CM | POA: Diagnosis not present

## 2017-09-25 DIAGNOSIS — R278 Other lack of coordination: Secondary | ICD-10-CM | POA: Diagnosis not present

## 2017-09-25 DIAGNOSIS — M6281 Muscle weakness (generalized): Secondary | ICD-10-CM | POA: Diagnosis not present

## 2017-09-25 DIAGNOSIS — J449 Chronic obstructive pulmonary disease, unspecified: Secondary | ICD-10-CM | POA: Diagnosis not present

## 2017-09-25 DIAGNOSIS — B3789 Other sites of candidiasis: Secondary | ICD-10-CM | POA: Diagnosis not present

## 2017-09-25 DIAGNOSIS — J189 Pneumonia, unspecified organism: Secondary | ICD-10-CM | POA: Diagnosis not present

## 2017-09-25 DIAGNOSIS — E669 Obesity, unspecified: Secondary | ICD-10-CM | POA: Diagnosis not present

## 2017-09-25 DIAGNOSIS — G253 Myoclonus: Secondary | ICD-10-CM | POA: Diagnosis not present

## 2017-09-25 DIAGNOSIS — Z886 Allergy status to analgesic agent status: Secondary | ICD-10-CM | POA: Diagnosis not present

## 2017-09-25 DIAGNOSIS — J302 Other seasonal allergic rhinitis: Secondary | ICD-10-CM | POA: Diagnosis not present

## 2017-09-25 DIAGNOSIS — E119 Type 2 diabetes mellitus without complications: Secondary | ICD-10-CM | POA: Diagnosis not present

## 2017-09-25 DIAGNOSIS — I10 Essential (primary) hypertension: Secondary | ICD-10-CM | POA: Diagnosis not present

## 2017-09-25 DIAGNOSIS — Z87891 Personal history of nicotine dependence: Secondary | ICD-10-CM | POA: Diagnosis not present

## 2017-09-25 DIAGNOSIS — J441 Chronic obstructive pulmonary disease with (acute) exacerbation: Secondary | ICD-10-CM | POA: Diagnosis not present

## 2017-09-25 DIAGNOSIS — R52 Pain, unspecified: Secondary | ICD-10-CM | POA: Diagnosis not present

## 2017-09-25 DIAGNOSIS — I509 Heart failure, unspecified: Secondary | ICD-10-CM | POA: Diagnosis not present

## 2017-09-25 DIAGNOSIS — E876 Hypokalemia: Secondary | ICD-10-CM | POA: Diagnosis not present

## 2017-09-25 DIAGNOSIS — I5032 Chronic diastolic (congestive) heart failure: Secondary | ICD-10-CM | POA: Diagnosis not present

## 2017-09-25 DIAGNOSIS — R531 Weakness: Secondary | ICD-10-CM | POA: Diagnosis not present

## 2017-09-25 DIAGNOSIS — B372 Candidiasis of skin and nail: Secondary | ICD-10-CM | POA: Diagnosis not present

## 2017-09-25 DIAGNOSIS — M1990 Unspecified osteoarthritis, unspecified site: Secondary | ICD-10-CM | POA: Diagnosis not present

## 2017-09-25 DIAGNOSIS — E1142 Type 2 diabetes mellitus with diabetic polyneuropathy: Secondary | ICD-10-CM | POA: Diagnosis not present

## 2017-09-25 DIAGNOSIS — M797 Fibromyalgia: Secondary | ICD-10-CM | POA: Diagnosis not present

## 2017-09-25 DIAGNOSIS — M25472 Effusion, left ankle: Secondary | ICD-10-CM | POA: Diagnosis not present

## 2017-09-25 DIAGNOSIS — M545 Low back pain: Secondary | ICD-10-CM | POA: Diagnosis not present

## 2017-09-25 DIAGNOSIS — E7849 Other hyperlipidemia: Secondary | ICD-10-CM | POA: Diagnosis not present

## 2017-09-25 DIAGNOSIS — Z6841 Body Mass Index (BMI) 40.0 and over, adult: Secondary | ICD-10-CM | POA: Diagnosis not present

## 2017-09-25 DIAGNOSIS — J9611 Chronic respiratory failure with hypoxia: Secondary | ICD-10-CM | POA: Diagnosis not present

## 2017-09-25 DIAGNOSIS — G4733 Obstructive sleep apnea (adult) (pediatric): Secondary | ICD-10-CM | POA: Diagnosis not present

## 2017-09-25 DIAGNOSIS — Z741 Need for assistance with personal care: Secondary | ICD-10-CM | POA: Diagnosis not present

## 2017-09-25 DIAGNOSIS — G8929 Other chronic pain: Secondary | ICD-10-CM | POA: Diagnosis not present

## 2017-09-25 DIAGNOSIS — M25572 Pain in left ankle and joints of left foot: Secondary | ICD-10-CM | POA: Diagnosis not present

## 2017-09-25 DIAGNOSIS — M549 Dorsalgia, unspecified: Secondary | ICD-10-CM | POA: Diagnosis not present

## 2017-09-25 DIAGNOSIS — I110 Hypertensive heart disease with heart failure: Secondary | ICD-10-CM | POA: Diagnosis not present

## 2017-09-25 DIAGNOSIS — R5381 Other malaise: Secondary | ICD-10-CM | POA: Diagnosis not present

## 2017-09-25 DIAGNOSIS — D649 Anemia, unspecified: Secondary | ICD-10-CM | POA: Diagnosis not present

## 2017-09-25 DIAGNOSIS — J9692 Respiratory failure, unspecified with hypercapnia: Secondary | ICD-10-CM | POA: Diagnosis not present

## 2017-09-25 DIAGNOSIS — I452 Bifascicular block: Secondary | ICD-10-CM | POA: Diagnosis not present

## 2017-09-25 DIAGNOSIS — R5382 Chronic fatigue, unspecified: Secondary | ICD-10-CM | POA: Diagnosis not present

## 2017-09-25 DIAGNOSIS — R739 Hyperglycemia, unspecified: Secondary | ICD-10-CM | POA: Diagnosis not present

## 2017-09-25 DIAGNOSIS — D539 Nutritional anemia, unspecified: Secondary | ICD-10-CM | POA: Diagnosis not present

## 2017-09-25 DIAGNOSIS — I5031 Acute diastolic (congestive) heart failure: Secondary | ICD-10-CM | POA: Diagnosis not present

## 2017-09-25 DIAGNOSIS — Z66 Do not resuscitate: Secondary | ICD-10-CM | POA: Diagnosis not present

## 2017-09-25 DIAGNOSIS — K5900 Constipation, unspecified: Secondary | ICD-10-CM | POA: Diagnosis not present

## 2017-09-25 DIAGNOSIS — I872 Venous insufficiency (chronic) (peripheral): Secondary | ICD-10-CM | POA: Diagnosis not present

## 2017-09-25 DIAGNOSIS — E785 Hyperlipidemia, unspecified: Secondary | ICD-10-CM | POA: Diagnosis not present

## 2017-09-25 LAB — CBC
HEMATOCRIT: 30.5 % — AB (ref 36.0–46.0)
HEMOGLOBIN: 9 g/dL — AB (ref 12.0–15.0)
MCH: 28.1 pg (ref 26.0–34.0)
MCHC: 29.5 g/dL — AB (ref 30.0–36.0)
MCV: 95.3 fL (ref 78.0–100.0)
Platelets: 201 10*3/uL (ref 150–400)
RBC: 3.2 MIL/uL — AB (ref 3.87–5.11)
RDW: 16.1 % — ABNORMAL HIGH (ref 11.5–15.5)
WBC: 4.7 10*3/uL (ref 4.0–10.5)

## 2017-09-25 LAB — BASIC METABOLIC PANEL
ANION GAP: 7 (ref 5–15)
BUN: 12 mg/dL (ref 6–20)
CHLORIDE: 101 mmol/L (ref 101–111)
CO2: 33 mmol/L — AB (ref 22–32)
Calcium: 8.7 mg/dL — ABNORMAL LOW (ref 8.9–10.3)
Creatinine, Ser: 0.75 mg/dL (ref 0.44–1.00)
GFR calc non Af Amer: >60 mL/min (ref >60)
Glucose, Bld: 104 mg/dL — ABNORMAL HIGH (ref 65–99)
POTASSIUM: 4 mmol/L (ref 3.5–5.1)
SODIUM: 141 mmol/L (ref 135–145)

## 2017-09-25 LAB — GLUCOSE, CAPILLARY
GLUCOSE-CAPILLARY: 83 mg/dL (ref 65–99)
GLUCOSE-CAPILLARY: 108 mg/dL — AB (ref 65–99)
GLUCOSE-CAPILLARY: 84 mg/dL (ref 65–99)

## 2017-09-25 MED ORDER — TRAZODONE HCL 100 MG PO TABS: 100.0000 mg | ORAL_TABLET | Freq: Every evening | ORAL | 0 refills | Status: DC | PRN

## 2017-09-25 MED ORDER — TRAZODONE HCL 100 MG PO TABS: 100.0000 mg | ORAL_TABLET | Freq: Every evening | ORAL | Status: DC | PRN

## 2017-09-25 MED ORDER — FLUCONAZOLE 100 MG PO TABS
150.0000 mg | ORAL_TABLET | ORAL | Status: DC
  Administered 2017-09-25: 150 mg via ORAL
  Filled 2017-09-25: qty 2

## 2017-09-25 NOTE — Discharge Summary (Signed)
[facility] Discharge Summary  [name] Medical record number: [id number] Date of birth: 24-Apr-1961 Age: 56 y.o. Gender: female Date of Admission: 09/24/2017  Date of Discharge: 09/25/2017 Admitting Physician: Blane Ohara McDiarmid, MD  Primary Care Provider: Mayo, Pete Pelt, MD Consultants: None  Indication for Hospitalization: weakness  Discharge Diagnoses/Problem List:  Weakness Chronic back pain COPD and OSA Diastolic heart failure Hypokalemia HTN T2DM Depression Immobility Neuropathic pain/fibromyalgia Candidal infection Anemia  Disposition: SNF  Discharge Condition: stable  Discharge Exam:  General: awake and alert, laying in bed, NAD Cardiovascular: RRR, no MRG Respiratory: CTAB, no wheezes, rales, or rhonchi  Abdomen: soft, non tender, non distended, bowel sounds x 4 quadrants  Extremities: non pitting edema bilaterally in lower extremities  Skin: erythema in legs bilaterally and left axilla  Neuro: AAOx3, no focal neuro deficits   Brief Hospital Course:  Kathleen Roy is a 56 y.o. female presenting with weakness and inability to ambulate since being discharged on 09/22/17. Patient's previous admission was for AMS 2/2 hypercarbic respiratory failure. At that time patient refused SNF placement but realized when she got home she was too weak to move from bed to bedside commode. Patient worked with PT/OT during admission who recommended SNF placement. Patient was able to stand with PT.   Issues for Follow Up:  1. Weakness 2. Dosage of trazodone (patient reports taking 326m daily, but home medication is listed as 556m.  3. Compliance with supplemental O2 4. Pt refusing CPAP - consider restarting if agreeable  5. Monitor candida infection of left axilla  6. Follow up on anemia   Significant Procedures: None  Significant Labs and Imaging:   Recent Labs Lab 09/22/17 0532 09/24/17 0303 09/25/17 0353  WBC 5.8 5.3 4.7   HGB 8.9* 9.5* 9.0*  HCT 29.2* 32.1* 30.5*  PLT 201 178 201    Recent Labs Lab 09/19/17 2002 09/20/17 0410 09/21/17 0349 09/22/17 0532 09/24/17 0303 09/25/17 0353  NA 142 142 140 141 140 141  K 5.1 4.4 3.7 3.9 3.2* 4.0  CL 94* 94* 91* 97* 97* 101  CO2 42* 39* 39* 34* 34* 33*  GLUCOSE 127* 76 110* 93 204* 104*  BUN '10 8 10 11 14 12  ' CREATININE 0.79 0.67 0.82 0.81 0.84 0.75  CALCIUM 9.1 9.1 8.8* 8.8* 9.1 8.7*  ALKPHOS 120 104  --   --  92  --   AST 15 16  --   --  15  --   ALT 13* 10*  --   --  12*  --   ALBUMIN 3.2* 3.1*  --   --  3.2*  --     Results/Tests Pending at Time of Discharge:  [facility]   Ordered   10/01/17 0500  Creatinine, serum  (enoxaparin (LOVENOX)    CrCl >/= 30 ml/min)  Weekly,   R    Comments:  while on enoxaparin therapy    09/24/17 1019   09/26/17 [id number] metabolic panel  Daily,   R     09/25/17 1157   09/26/17 0500  CBC  Daily,   R     09/25/17 1157      Discharge Medications:  Allergies as of 09/25/2017      Reactions   Aspirin Hives   REACTION: hives Hasn't had in years       Medication List    TAKE these medications   ARIPiprazole 2  MG tablet Commonly known as:  ABILIFY Take 1 tablet (2 mg total) by mouth every other day.   atorvastatin 10 MG tablet Commonly known as:  LIPITOR Take 1 tablet (10 mg total) by mouth at bedtime.   BREO ELLIPTA 100-25 MCG/INH Aepb Generic drug:  fluticasone furoate-vilanterol Inhale 1 puff into the lungs daily.   cetirizine 10 MG tablet Commonly known as:  ZYRTEC Take 1 tablet (10 mg total) by mouth daily.   cyclobenzaprine 10 MG tablet Commonly known as:  FLEXERIL Take 1 tablet (10 mg total) by mouth 3 (three) times daily as needed for muscle spasms.   Fish Oil 1000 MG Caps Take 2 capsules (2,000 mg total) by mouth at bedtime.   Fluticasone-Salmeterol 100-50 MCG/DOSE Aepb Commonly known as:  ADVAIR Inhale 1 puff into the lungs 2 (two) times daily.   naloxone 4  MG/0.1ML Liqd nasal spray kit Commonly known as:  NARCAN Place 1 spray into the nose once as needed (opiod overdose).   nystatin powder Generic drug:  nystatin Apply topically See admin instructions. Apply topically to affected area as needed for rash/itching   oxyCODONE-acetaminophen 10-325 MG tablet Commonly known as:  PERCOCET Take one tablet by mouth every 6 hours as needed for pain   OXYGEN Inhale 4 L into the lungs continuous.   polyethylene glycol packet Commonly known as:  MIRALAX / GLYCOLAX Take 17 g by mouth daily. Mix in 8 oz liquid and drink   potassium chloride SA 20 MEQ tablet Commonly known as:  K-DUR,KLOR-CON Take 2 tablets once a day. What changed:  how much to take  how to take this  when to take this  additional instructions   pregabalin 150 MG capsule Commonly known as:  LYRICA Take 1 capsule (150 mg total) by mouth 2 (two) times daily.   PROAIR HFA 108 (90 Base) MCG/ACT inhaler Generic drug:  albuterol Inhale 2 puffs into the lungs every 6 (six) hours as needed for wheezing or shortness of breath.   torsemide 20 MG tablet Commonly known as:  DEMADEX Take 40 mg by mouth 2 (two) times daily.   traZODone 100 MG tablet Commonly known as:  DESYREL Take 1 tablet (100 mg total) by mouth at bedtime as needed for sleep. What changed:  medication strength  how much to take  reasons to take this   venlafaxine XR 150 MG 24 hr capsule Commonly known as:  EFFEXOR-XR Take 300 mg by mouth daily.   vitamin C 1000 MG tablet Take 1,000 mg by mouth 3 (three) times daily.       Discharge Instructions: Please refer to Patient Instructions section of EMR for full details.  Patient was counseled important signs and symptoms that should prompt return to medical care, changes in medications, dietary instructions, activity restrictions, and follow up appointments.   Follow-Up Appointments: Follow-up Information    Mayo, Pete Pelt, MD. Go on 10/07/2017.    Specialty:  Family Medicine Why:  '@1' :30 pm (please arrive 15 min early) Contact information: [place] [address] [phone]           Caroline More, DO 09/25/2017, 5:17 PM PGY-1, [place] [facility]

## 2017-09-25 NOTE — Clinical Social Work Placement (Signed)
   CLINICAL SOCIAL WORK PLACEMENT  NOTE  Date:  09/25/2017  Patient Details  Name: Kathleen Roy MRN: [id number] Date of Birth: [birth year]  Clinical Social Work is seeking post-discharge placement for this patient at the [place] level of care (*CSW will initial, date and re-position this form in  chart as items are completed):  No (Patient provided CSW with facility preference)   Patient/family provided with [place] Work Department's list of facilities offering this level of care within the geographic area requested by the patient (or if unable, by the patient's family).  Yes   Patient/family informed of their freedom to choose among providers that offer the needed level of care, that participate in Medicare, Medicaid or managed care program needed by the patient, have an available bed and are willing to accept the patient.  No   Patient/family informed of [facility] [facility]'s ownership interest in [facility] and [facility], as well as of the fact that they are under no obligation to receive care at these facilities.  PASRR submitted to EDS on       PASRR number received on       Existing PASRR number confirmed on 09/25/17     FL2 transmitted to all facilities in geographic area requested by pt/family on 09/25/17     FL2 transmitted to all facilities within larger geographic area on       Patient informed that his/her managed care company has contracts with or will negotiate with certain facilities, including the following:        Yes   Patient/family informed of bed offers received.  Patient chooses bed at       Physician recommends and patient chooses bed at      Patient to be transferred to   on  .  Patient to be transferred to facility by       Patient family notified on   of transfer.  Name of family member notified:        PHYSICIAN       Additional Comment:    _______________________________________________ Sable Feil, [name] 09/25/2017, 3:43 PM

## 2017-09-25 NOTE — Clinical Social Work Note (Signed)
Clinical Social Work Assessment  Patient Details  Name: Kathleen Roy MRN: [id number] Date of Birth: 26-Feb-1961  Date of referral:  09/25/17               Reason for consult:  Facility Placement                Permission sought to share information with:  Family Supports Permission granted to share information::  Yes, Verbal Permission Granted  Name::     Shahla Betsill  Agency::     Relationship::  Brother  Contact Information:  [phone] (home) and [phone] (mobile)  Housing/Transportation Living arrangements for the past 2 months:  [place] of Information:  Patient Patient Interpreter Needed:  None Criminal Activity/Legal Involvement Pertinent to Current Situation/Hospitalization:  No - Comment as needed Significant Relationships:  Siblings Lives with:  Siblings Do you feel safe going back to the place where you live?  No (Patient in agreement with ST rehab before returning home) Need for family participation in patient care:  Yes (Comment)  Care giving concerns:  Patient expressed agreement with ST rehab post hospitalization due to weakened physical status and her current dependence on her brother for assistance at home.  Social Worker assessment / plan:  CSW talked with patient at the bedside regarding recommendation of ST rehab. Ms. Tsang was in bed asleep and awakened after her name was called loudly several times. Ms. Lovett informed CSW that her brother lives with her and assists her at home. Per patient, she has a chair that she sits and sleeps in and when asked about walking at home, Ms. Boak explained that she pivots from her chair to  the commode and then pivots to get back in her chair. Patient in agreement with ST rehab and advised CSW that she has been in several facilities and provided CSW with her preference.  Employment status:  Disabled (Comment on whether or not currently receiving Disability) Insurance information:  Programmer, applications [facility]) PT  Recommendations:  [place] / Referral to community resources:  [place] (Skilled facility list not provided as patient expressed preference)  Patient/Family's Response to care:  Patient did not express any concerns regarding her care during hospitalization.  Patient/Family's Understanding of and Emotional Response to Diagnosis, Current Treatment, and Prognosis:  Not discussed.  Emotional Assessment Appearance:  Appears stated age Attitude/Demeanor/Rapport:  Other (Appropriate) Affect (typically observed):  Appropriate, Pleasant Orientation:  Oriented to Self, Oriented to Place, Oriented to  Time, Oriented to Situation Alcohol / Substance use:  Tobacco Use, Alcohol Use, Illicit Drugs (Patient reported that she quit smoking and does not drink or use illicit drugs) Psych involvement (Current and /or in the community):  No (Comment)  Discharge Needs  Concerns to be addressed:  Discharge Planning Concerns Readmission within the last 30 days:  Yes Current discharge risk:  None Barriers to Discharge:  Other (Morbid obesity)   Sable Feil, [name] 09/25/2017, 3:32 PM

## 2017-09-25 NOTE — Progress Notes (Signed)
Report called to Wyatt Portela, Therapist, sports at [facility]

## 2017-09-25 NOTE — NC FL2 (Signed)
[place] LEVEL OF CARE SCREENING TOOL     IDENTIFICATION  [name] Birthdate: 1961/01/18 Sex: female Admission Date (Current Location): 09/24/2017  [facility] and [place] Number:  Herbalist and Address:  [facility] [place]. [facility], [place] [address] [place], [place] [address]      Provider Number: [id number]  Attending Physician Name and Address:  McDiarmid, Blane Ohara, MD  Relative Name and Phone Number:  Hindes,Don, Brother: [phone] (home)  [phone] (mobile)     Current Level of Care: Hospital Recommended Level of Care: [place] Prior Approval Number:    Date Approved/Denied:   PASRR Number: [id number] (Eff. 08/20/16)  Discharge Plan: SNF    Current Diagnoses: Patient Active Problem List   Diagnosis Date Noted  . Weakness 09/24/2017  . Other fatigue   . Altered mental status 09/20/2017  . 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 Allergic rhinitis 08/15/2017  . Immobility 08/15/2017  . Fibromyalgia 06/16/2017  . Chronic pain of both knees   . Acute respiratory failure with hypercapnia ([place]) 10/15/2016  . Type 2 diabetes mellitus ([place]) 02/14/2016  . Chronic diastolic congestive heart failure ([place])   . Essential hypertension   . OSA (obstructive sleep apnea)   . Obesity hypoventilation syndrome ([place])   . Chronic respiratory failure with hypoxia ([place])   . Hyperglycemia 11/08/2014  . Memory loss 03/18/2014  . Venous stasis dermatitis of both lower extremities 12/02/2012  . COPD (chronic obstructive pulmonary disease) ([place]) 10/15/2011  . Peripheral neuropathy ([place]) 10/15/2011  . BACK PAIN, LUMBAR, CHRONIC 01/22/2011  . Morbid obesity with BMI of 70 and over, adult ([place]) 11/16/2007  . Major depressive disorder, recurrent episode ([place]) 02/12/2007    Orientation RESPIRATION BLADDER Height & Weight     Self, Time, Situation, Place  O2 (4L oxygen) Incontinent (External urinary catheter placed 09/24/17) Weight: (!) 375  lb 3.6 oz (170.2 kg) Height:     BEHAVIORAL SYMPTOMS/MOOD NEUROLOGICAL BOWEL NUTRITION STATUS      Continent Diet (Heart healthy/carb modified)  AMBULATORY STATUS COMMUNICATION OF NEEDS Skin   Total Care (Patient unable to ambulate with PT) Verbally Other (Comment) (Cellulitis right/left foot and leg; MASD to abdomen breast, buttocks, groin)                       Personal Care Assistance Level of Assistance  Bathing, Feeding, Dressing Bathing Assistance: Maximum assistance Feeding assistance: Independent Dressing Assistance: Maximum assistance     Functional Limitations Info  Sight, Hearing, Speech Sight Info: Adequate Hearing Info: Adequate Speech Info: Adequate    SPECIAL CARE FACTORS FREQUENCY  PT (By licensed PT), OT (By licensed OT)     PT Frequency: Evaluated 10/11 and a minimum of 2X per week therapy recommended OT Frequency: Evaluated 10/11 and a minimum of 2X per week therapy recommended            Contractures Contractures Info: Not present    Additional Factors Info  Code Status, Allergies Code Status Info: DNR Allergies Info: Aspirin           Current Medications (09/25/2017):  This is the current hospital active medication list Current Facility-Administered Medications  Medication Dose Route Frequency Provider Last Rate Last Dose  . albuterol (PROVENTIL) (2.5 MG/3ML) 0.083% nebulizer solution 2.5 mg  2.5 mg Nebulization Q6H PRN McDiarmid, Blane Ohara, MD      . ARIPiprazole (ABILIFY) tablet 2 mg  2 mg Oral QODAY Rogue Bussing, MD  2 mg at 09/25/17 0825  . atorvastatin (LIPITOR) tablet 10 mg  10 mg Oral QHS Rogue Bussing, MD   10 mg at 09/24/17 2232  . cyclobenzaprine (FLEXERIL) tablet 10 mg  10 mg Oral TID PRN Rogue Bussing, MD      . enoxaparin (LOVENOX) injection 80 mg  80 mg Subcutaneous Q24H McDiarmid, Blane Ohara, MD   80 mg at 09/24/17 2232  . fluticasone furoate-vilanterol (BREO ELLIPTA) 100-25 MCG/INH 1 puff  1 puff  Inhalation Daily Rogue Bussing, MD   1 puff at 09/25/17 0805  . insulin aspart (novoLOG) injection 0-15 Units  0-15 Units Subcutaneous TID WC Rogue Bussing, MD      . loratadine [facility]) tablet 10 mg  10 mg Oral Daily Olene Floss [place], MD   10 mg at 09/25/17 0825  . nystatin (MYCOSTATIN/NYSTOP) topical powder   Topical PRN Rogue Bussing, MD      . oxyCODONE-acetaminophen (PERCOCET/ROXICET) 5-325 MG per tablet 1 tablet  1 tablet Oral Q6H PRN McDiarmid, Blane Ohara, MD   1 tablet at 09/25/17 0150   And  . oxyCODONE (Oxy IR/ROXICODONE) immediate release tablet 5 mg  5 mg Oral Q6H PRN McDiarmid, Blane Ohara, MD   5 mg at 09/25/17 0150  . polyethylene glycol (MIRALAX / GLYCOLAX) packet 17 g  17 g Oral Daily Rogue Bussing, MD   17 g at 09/25/17 5520  . pregabalin (LYRICA) capsule 150 mg  150 mg Oral BID Rogue Bussing, MD   150 mg at 09/25/17 0825  . torsemide (DEMADEX) tablet 40 mg  40 mg Oral BID Rogue Bussing, MD   40 mg at 09/25/17 0825  . traZODone (DESYREL) tablet 100 mg  100 mg Oral QHS PRN Caroline More, DO      . venlafaxine XR (EFFEXOR-XR) 24 hr capsule 300 mg  300 mg Oral Daily Rogue Bussing, MD   300 mg at 09/25/17 0825     Discharge Medications: Please see discharge summary for a list of discharge medications.  Relevant Imaging Results:  Relevant Lab Results:   Additional Information ss#[id number]  Sable Feil, [name]

## 2017-09-25 NOTE — Evaluation (Signed)
Physical Therapy Evaluation Patient Details Name: Kathleen Roy MRN: [id number] DOB: [birth year] Today's Date: 09/25/2017   History of Present Illness  56 y.o. female presenting with weakness. PMH is significant for morbid obesity, MDD, chronic back pain (previously on opana), COPD, OSA (not on CPAP at home), diastolic heart failure (EF 60-65%, G2DD), HTN, T2DM, myoclonus, and immobility.   Clinical Impression  Pt was able to verbalize understanding with PT after walking through the events that led to returning to hosp.  Talked with her about the fact that she was not standing when she left and went home unable to stand, which she now understands as a reason to go to SNF.  Her standing with 2 max was for a minute, at side of bed with walker.  Got to chair with second person assist with OT arriving to help with maxi move.  Her chair was the only surface left in room as bed was broken, and will receive another bed.  Talked with nurse about the need to give her a sky lift room if possible and that the maximove is her current safe level for nursing to transfer her.  Continue acutely for strengthening and standing with PT and will progress to SNF when medically ready.    Follow Up Recommendations SNF    Equipment Recommendations  None recommended by PT    Recommendations for Other Services       Precautions / Restrictions Precautions Precautions: Fall Restrictions Weight Bearing Restrictions: No      Mobility  Bed Mobility Overal bed mobility: Needs Assistance Bed Mobility: Rolling;Supine to Sit;Sit to Supine Rolling: Max assist   Supine to sit: Mod assist;Max assist Sit to supine: Mod assist   General bed mobility comments: sat up with trunk help and minor to legs but back to bed with legs  Transfers Overall transfer level: Needs assistance Equipment used: Rolling walker (2 wheeled);1 person hand held assist Transfers: Sit to/from Stand;Lateral/Scoot Transfers Sit to Stand: Max  assist;+2 physical assistance;+2 safety/equipment (lower bed height due to bed breaking)        Lateral/Scoot Transfers: Min assist General transfer comment: Utilized maximove for OOB to chair  Ambulation/Gait             General Gait Details: unable to step  Stairs            Wheelchair Mobility    Modified Rankin (Stroke Patients Only)       Balance Overall balance assessment: Needs assistance Sitting-balance support: Feet supported;Bilateral upper extremity supported Sitting balance-Leahy Scale: Fair     Standing balance support: Bilateral upper extremity supported;During functional activity Standing balance-Leahy Scale: Poor                               Pertinent Vitals/Pain Pain Assessment: Faces Pain Score: 4  Faces Pain Scale: Hurts little more Pain Location: Lfoot Pain Descriptors / Indicators: Sore;Tightness Pain Intervention(s): Limited activity within patient's tolerance;Monitored during session;Premedicated before session;Repositioned    Home Living Family/patient expects to be discharged to:: Private residence Living Arrangements:  (brother) Available Help at Discharge: Family;Available 24 hours/day Type of Home: House Home Access: Ramped entrance     Home Layout: One level Home Equipment: Bedside commode;Transport chair;Other (comment);Tub bench (lift chair) Additional Comments: Reports that doors are too narrow to accomodate bariatric walker or her transport chair    Prior Function Level of Independence: Needs assistance   Gait / Transfers Assistance Needed:  transfers bed to chair only  ADL's / Homemaking Assistance Needed: assist for bathing a few times per week  Comments: brother is not well and cannot be her caregiver     Hand Dominance   Dominant Hand: Right    Extremity/Trunk Assessment   Upper Extremity Assessment Upper Extremity Assessment: Generalized weakness    Lower Extremity Assessment Lower  Extremity Assessment: Generalized weakness    Cervical / Trunk Assessment Cervical / Trunk Assessment:  (back pain with unsupported sitting and standing) Cervical / Trunk Exceptions: morbid obesity  Communication   Communication: No difficulties  Cognition Arousal/Alertness: Awake/alert Behavior During Therapy: WFL for tasks assessed/performed Overall Cognitive Status: Within Functional Limits for tasks assessed                                 General Comments: has been talked through her concerns about why she struggled at home      General Comments      Exercises     Assessment/Plan    PT Assessment Patient needs continued PT services  PT Problem List Decreased strength;Decreased range of motion;Decreased activity tolerance;Decreased balance;Decreased mobility;Decreased coordination;Decreased knowledge of use of DME;Decreased safety awareness;Cardiopulmonary status limiting activity;Obesity;Pain;Decreased skin integrity       PT Treatment Interventions DME instruction;Gait training;Functional mobility training;Therapeutic activities;Therapeutic exercise;Balance training;Neuromuscular re-education;Patient/family education    PT Goals (Current goals can be found in the Care Plan section)  Acute Rehab PT Goals Patient Stated Goal: rehab to get better PT Goal Formulation: With patient Time For Goal Achievement: 10/09/17 Potential to Achieve Goals: Fair    Frequency Min 2X/week   Barriers to discharge Decreased caregiver support (brother is not well)      Co-evaluation   Reason for Co-Treatment: Complexity of the patient's impairments (multi-system involvement);For patient/therapist safety;To address functional/ADL transfers   OT goals addressed during session: ADL's and self-care       AM-PAC PT "6 Clicks" Daily Activity  Outcome Measure Difficulty turning over in bed (including adjusting bedclothes, sheets and blankets)?: Unable Difficulty moving  from lying on back to sitting on the side of the bed? : Unable Difficulty sitting down on and standing up from a chair with arms (e.g., wheelchair, bedside commode, etc,.)?: Unable Help needed moving to and from a bed to chair (including a wheelchair)?: Total Help needed walking in hospital room?: Total Help needed climbing 3-5 steps with a railing? : Total 6 Click Score: 6    End of Session Equipment Utilized During Treatment: Oxygen Activity Tolerance: Patient limited by pain;Patient limited by fatigue Patient left: in chair;with call bell/phone within reach Nurse Communication: Mobility status;Need for lift equipment PT Visit Diagnosis: Unsteadiness on feet (R26.81);Muscle weakness (generalized) (M62.81);Difficulty in walking, not elsewhere classified (R26.2);Pain Pain - Right/Left: Left Pain - part of body: Ankle and joints of foot    Time: 1610-9604 PT Time Calculation (min) (ACUTE ONLY): 88 min   Charges:   PT Evaluation $PT Eval High Complexity: 1 High PT Treatments $Therapeutic Exercise: 8-22 mins $Therapeutic Activity: 38-52 mins   PT G Codes:   PT G-Codes **NOT FOR INPATIENT CLASS** Functional Assessment Tool Used: AM-PAC 6 Clicks Basic Mobility;Clinical judgement Functional Limitation: Changing and maintaining body position Changing and Maintaining Body Position Current Status (V4098): At least 80 percent but less than 100 percent impaired, limited or restricted Changing and Maintaining Body Position Goal Status (J1914): At least 20 percent but less than 40 percent impaired, limited  or restricted   Ramond Dial 09/25/2017, 10:14 AM   Mee Hives, PT MS Acute Rehab Dept. Number: [place]

## 2017-09-25 NOTE — Progress Notes (Signed)
Family Medicine Teaching Service Daily Progress Note Intern Pager: [phone]  [name] Medical record number: [id number] Date of birth: Jun 05, 1961 Age: 56 y.o. Gender: female  Primary Care Provider: Mayo, Pete Pelt, MD Consultants: None Code Status: DNR  Pt Overview and Major Events to Date:  Admitted to [facility] for observation on 09/24/17  Assessment and Plan: Kathleen Roy is a 56 y.o. female presenting with weakness. PMH is significant for morbid obesity, MDD, chronic back pain (previously on opana), COPD, OSA (not on CPAP at home), diastolic heart failure (EF 60-65%, G2DD), HTN, T2DM, myoclonus, and immobility.   Weakness Recent hospitalization for hypercarbic hypoxic respiratory failure from possible opioid overdose vs chronic baseline acidosis 2/2 nonadherence to CPAP and OHS. Declined SNF upon discharge 09/22/17 but now does not feel safe to be home. Suspect worsening of chronic deconditioning after hospitalization. 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 UDS negative and denies drug use aside from prescribed oxycodone. Offered to have patient's PCP arrange SNF placement outpatient, but patient did not feel safe, as she cannot even lift her legs. PT was able to assess patient today and patient reports increased movement and was able to stand with PT.  -Continued PT/OT  -Consult Social Work for SNF placement -Monitor electrolytes and respiratory status -Obtain ABG if any decline in level of alertness  Chronic back pain -Ordered home percocet 10-325 mg q6h prn and flexeril 10 mg TID prn as not altered -miralax daily  COPD and OSA Chronic. Does not use CPAP at home due to cost concerns per patient. No wheezing on today's exam. AOx3 but would obtain ABG if becomes less alert. O2 saturation of 100% on 4L [place].  -albuterol 2 puff  q6hrs prn  -breo 1 puff daily  -dulera 2 puff bid  -CPAP QHS (can refuse) -supplemental O2  Diastolic heart failure Chronic. Stable. Echo in 08/2016 showing EF of 60-65%. Weight 374 lb 09/19/17. Does not know "dry weight," as does not have scale at home. Saturating 100% on her home 4L O2 and lungs CTAB. -monitor I&O -daily weights  -torsemide 40 mg bid  -obtain BNP if weight up  Hypokalemia-resolved  K 3.2 on admission. Current K 4.0.  - kdur 40 mEq - BMP daily  HTN Chronic. Stable. Home atorvastatin and torsemide -continue home medications   T2DM Chronic. Stable. No home medications. A1c 4.9 09/20/17, but CBG 204 on admission.  -monitor CBGs BID -moderate SSI  Depression Chronic. Stable. Patient states she is unable to sleep with current dose of trazodone.  -Continue home medications of venlafaxine  -increase trazodone to 100 mg   Immobility  History of immobility. Worsened after recent hospitalization. - PT/OT consults as above  Neuropathic pain/fibromyalgia Chronic with decreased sensation of LEs.  -Continue home lyrica 150 mg BID  Candidal infection Chronic -Continue home nystatin powder  Anemia  Chronic. Hgb 9.5 (BL~9-10) on admission. Current Hgb 9.0. Folate and B12 WNL last admission. Ferritin WNL and iron at low range of normal at 31. - Consider iron supplementation, though already with chronic constipation  FEN/GI: Heart healthy/carb modified Prophylaxis: lovenox   Disposition: awaiting SNF placement  Subjective:  Patient reports she feels "ok". States she is unable to sleep due to decreased trazodone dose. States she takes 300 mg at home but is only given 50 mg in hospital. States she was able to tolerate PT  well, was able to stand with PT assistance. States she has some back and knee pain after PT.   Objective: Temp:  [98.1 F (36.7 C)-98.8 F (37.1 C)] 98.1 F (36.7 C) (10/11 0900) Pulse Rate:  [74-90] 81 (10/11 0900) Resp:  [13-26] 18  (10/11 0900) BP: (97-150)/(54-90) 125/70 (10/11 0900) SpO2:  [96 %-100 %] 99 % (10/11 0900) Weight:  [375 lb 3.6 oz (170.2 kg)] 375 lb 3.6 oz (170.2 kg) (10/10 2149) Physical Exam: General: awake and alert, laying in bed, NAD Cardiovascular: RRR, no MRG Respiratory: CTAB, no wheezes, rales, or rhonchi  Abdomen: soft, non tender, non distended, bowel sounds x 4 quadrants  Extremities: non pitting edema bilaterally in lower extremities  Skin: erythema in legs bilaterally and left axilla  Neuro: AAOx3, no focal neuro deficits   Laboratory:  Recent Labs Lab 09/22/17 0532 09/24/17 0303 09/25/17 0353  WBC 5.8 5.3 4.7  HGB 8.9* 9.5* 9.0*  HCT 29.2* 32.1* 30.5*  PLT 201 178 201    Recent Labs Lab 09/19/17 2002 09/20/17 0410  09/22/17 0532 09/24/17 0303 09/25/17 0353  NA 142 142  < > 141 140 141  K 5.1 4.4  < > 3.9 3.2* 4.0  CL 94* 94*  < > 97* 97* 101  CO2 42* 39*  < > 34* 34* 33*  BUN 10 8  < > 11 14 12   CREATININE 0.79 0.67  < > 0.81 0.84 0.75  CALCIUM 9.1 9.1  < > 8.8* 9.1 8.7*  PROT 7.3 6.9  --   --  6.7  --   BILITOT 0.6 0.6  --   --  0.5  --   ALKPHOS 120 104  --   --  92  --   ALT 13* 10*  --   --  12*  --   AST 15 16  --   --  15  --   GLUCOSE 127* 76  < > 93 204* 104*  < > = values in this interval not displayed.  CBG (last 3)   Recent Labs  09/24/17 2146 09/25/17 0733 09/25/17 1103  GLUCAP 138* 83 108*    Imaging/Diagnostic Tests: Dg Chest Port 1 View  Result Date: 09/24/2017 CLINICAL DATA:  Weakness for 1 day EXAM: PORTABLE CHEST 1 VIEW COMPARISON:  09/19/2017 FINDINGS: There is moderate cardiomegaly, unchanged. There is moderate vascular and interstitial prominence. No confluent airspace consolidation. No large effusion. IMPRESSION: Cardiomegaly and moderate vascular prominence. No confluent consolidation. No effusion. Electronically Signed   By: [name] M.D.   On: 09/24/2017 03:38   Dg Chest Port 1 View  Result Date: 09/19/2017 CLINICAL  DATA:  56 y/o  F; altered mental status. EXAM: PORTABLE CHEST 1 VIEW COMPARISON:  11/26/2016 chest radiograph FINDINGS: Cardiomegaly. Patchy opacities throughout the lungs and prominent pulmonary markings. No pleural effusion or pneumothorax identified. Bones are unremarkable. IMPRESSION: Cardiomegaly. Diffuse patchy opacities throughout the lungs may represent alveolar pulmonary edema or multifocal pneumonia. Electronically Signed   By: Kristine Garbe M.D.   On: 09/19/2017 20:29    Caroline More, DO 09/25/2017, 11:48 AM PGY-1, [place] Intern pager: [phone], text pages welcome

## 2017-09-25 NOTE — Discharge Instructions (Signed)
You were admitted for increased weakness. You improved when working with PT/OT. You were provided SNF placement.   Please follow up with your PCP on 10/23 on 1:30pm.   If you develop symptoms of confusion or weakness please contact your PCP or come to the ED.

## 2017-09-25 NOTE — Care Management Obs Status (Signed)
[facility] NOTIFICATION   Patient Details  Name: Kathleen Roy MRN: [id number] Date of Birth: 19-Aug-1961   Medicare Observation Status Notification Given:  Yes    [name], Rory Percy, RN 09/25/2017, 4:40 PM

## 2017-09-25 NOTE — Evaluation (Signed)
Occupational Therapy Evaluation Patient Details Name: Kathleen Roy MRN: [id number] DOB: 1961/01/22 Today's Date: 09/25/2017    History of Present Illness 56 y.o. female presenting with weakness. PMH is significant for morbid obesity, MDD, chronic back pain (previously on opana), COPD, OSA (not on CPAP at home), diastolic heart failure (EF 60-65%, G2DD), HTN, T2DM, myoclonus, and immobility.    Clinical Impression   Pt with difficulty managing ADL at home PTA; sleeps in a lift chair and only performs transfers from chair <> BSC. Currently pt requires max assist for ADL and bed level. Utilized maximove for pt OOB>chair. Pt evaluated 4 days ago and OT recommended SNF for continued rehab to maximize independence and safety with ADL and functional mobility prior to return home--pt refused and ended up d/c'ing home; continue to feel SNF would be the safest d/c option for pt--she seems to be more agreeable this admission. Pt would benefit from continued skilled OT to address established goals.    Follow Up Recommendations  SNF;Supervision/Assistance - 24 hour    Equipment Recommendations  Other (comment) (TBD at next venue)    Recommendations for Other Services       Precautions / Restrictions Precautions Precautions: Fall Restrictions Weight Bearing Restrictions: (P) No      Mobility Bed Mobility Overal bed mobility: Needs Assistance Bed Mobility: Rolling Rolling: Max assist         General bed mobility comments: x1 to R side, x1 to L side. Max assist with use of bed pad and pt holding bed rails to assist  Transfers Overall transfer level: Needs assistance               General transfer comment: Utilized maximove for OOB to chair    Balance                                           ADL either performed or assessed with clinical judgement   ADL Overall ADL's : Needs assistance/impaired Eating/Feeding: Set up;Sitting Eating/Feeding Details (indicate  cue type and reason): pt set up with breakfast tray sitting in chair, observed to be self feeding Grooming: Set up;Supervision/safety;Sitting   Upper Body Bathing: Maximal assistance;Bed level   Lower Body Bathing: Maximal assistance;Bed level   Upper Body Dressing : Maximal assistance;Bed level   Lower Body Dressing: Maximal assistance;Bed level               Functional mobility during ADLs:  (Maximove for OOB) General ADL Comments: Discussed SNF for follow up; pt agreeable this admission     Vision         Perception     Praxis      Pertinent Vitals/Pain Pain Assessment: Faces Faces Pain Scale: Hurts little more Pain Location: LEs Pain Descriptors / Indicators: Grimacing;Discomfort Pain Intervention(s): Monitored during session;Limited activity within patient's tolerance;Repositioned     Hand Dominance Right   Extremity/Trunk Assessment Upper Extremity Assessment Upper Extremity Assessment: Generalized weakness   Lower Extremity Assessment Lower Extremity Assessment: Defer to PT evaluation   Cervical / Trunk Assessment Cervical / Trunk Assessment: Other exceptions Cervical / Trunk Exceptions: morbid obesity   Communication Communication Communication: No difficulties   Cognition Arousal/Alertness: Awake/alert Behavior During Therapy: WFL for tasks assessed/performed Overall Cognitive Status: Within Functional Limits for tasks assessed  General Comments       Exercises     Shoulder Instructions      Home Living Family/patient expects to be discharged to:: Private residence Living Arrangements: Other relatives (brother) Available Help at Discharge: Family;Available 24 hours/day Type of Home: House Home Access: Ramped entrance     Home Layout: One level     Bathroom Shower/Tub: Teacher, early years/pre: Standard     Home Equipment: Bedside commode;Transport chair;Other  (comment);Tub bench (lift chair)          Prior Functioning/Environment Level of Independence: Needs assistance  Gait / Transfers Assistance Needed: stand pivot from lift chair to [facility] only ADL's / Homemaking Assistance Needed: assist for bathing a few times per week            OT Problem List: Decreased strength;Decreased range of motion;Decreased activity tolerance;Impaired balance (sitting and/or standing);Decreased knowledge of use of DME or AE;Impaired sensation;Obesity;Impaired UE functional use;Pain;Increased edema      OT Treatment/Interventions: Self-care/ADL training;Therapeutic exercise;Energy conservation;DME and/or AE instruction;Therapeutic activities;Patient/family education;Balance training    OT Goals(Current goals can be found in the care plan section) Acute Rehab OT Goals Patient Stated Goal: rehab to get better OT Goal Formulation: With patient Time For Goal Achievement: 10/09/17 Potential to Achieve Goals: Good ADL Goals Pt Will Perform Upper Body Bathing: with min assist;sitting Pt Will Perform Lower Body Bathing: sit to/from stand;with mod assist Pt Will Transfer to Toilet: with mod assist;stand pivot transfer;bedside commode Pt Will Perform Toileting - Clothing Manipulation and hygiene: with mod assist;sit to/from stand Pt/caregiver will Perform Home Exercise Program: Increased strength;Both right and left upper extremity;With theraband;With written HEP provided;Independently  OT Frequency: Min 2X/week   Barriers to D/C: Decreased caregiver support          Co-evaluation PT/OT/SLP Co-Evaluation/Treatment: Yes Reason for Co-Treatment: Complexity of the patient's impairments (multi-system involvement);For patient/therapist safety;To address functional/ADL transfers   OT goals addressed during session: ADL's and self-care      AM-PAC PT "6 Clicks" Daily Activity     Outcome Measure Help from another person eating meals?: None Help from another  person taking care of personal grooming?: A Little Help from another person toileting, which includes using toliet, bedpan, or urinal?: Total Help from another person bathing (including washing, rinsing, drying)?: A Lot Help from another person to put on and taking off regular upper body clothing?: A Lot Help from another person to put on and taking off regular lower body clothing?: A Lot 6 Click Score: 14   End of Session Equipment Utilized During Treatment: Oxygen Nurse Communication: Mobility status;Need for lift equipment  Activity Tolerance: Patient tolerated treatment well Patient left: in chair;with call bell/phone within reach  OT Visit Diagnosis: Unsteadiness on feet (R26.81);Other abnormalities of gait and mobility (R26.89);Muscle weakness (generalized) (M62.81);Pain                Time: 9147-8295 OT Time Calculation (min): 27 min Charges:  OT General Charges $OT Visit: 1 Visit OT Evaluation $OT Eval Moderate Complexity: 1 Mod G-Codes:     Amethyst Gainer A. Ulice Brilliant, M.S., OTR/L Pager: [phone] 09/25/2017, 9:46 AM

## 2017-09-25 NOTE — Progress Notes (Deleted)
   Subjective:   Patient ID: Kathleen Roy    DOB: 05-24-1961, 56 y.o. female   MRN: [id number]  Kathleen Roy is a 56 y.o. female with a history of *** here for ***  Follow up from Recent Admission   Review of Systems:  Per HPI.   [place]: ***. Smoking status reviewed. Medications reviewed.  Objective:   There were no vitals taken for this visit. Vitals and nursing note reviewed.  General: well nourished, well developed, in no acute distress with non-toxic appearance HEENT: normocephalic, atraumatic, moist mucous membranes Neck: supple, non-tender without lymphadenopathy CV: regular rate and rhythm without murmurs, rubs, or gallops, no lower extremity edema Lungs: clear to auscultation bilaterally with normal work of breathing Abdomen: soft, non-tender, non-distended, no masses or organomegaly palpable, normoactive bowel sounds Skin: warm, dry, no rashes or lesions, cap refill < 2 seconds Extremities: warm and well perfused, normal tone MSK: Full ROM, strength intact, gait normal Neuro: Alert and oriented, speech normal  Assessment & Plan:   No problem-specific Assessment & Plan notes found for this encounter.  No orders of the defined types were placed in this encounter.  No orders of the defined types were placed in this encounter.   Rory Percy, DO PGY-1, [facility] 09/25/2017 8:13 AM

## 2017-09-26 ENCOUNTER — Other Ambulatory Visit: Payer: Self-pay

## 2017-09-26 ENCOUNTER — Encounter: Payer: Self-pay | Admitting: Internal Medicine

## 2017-09-26 LAB — CULTURE, BLOOD (ROUTINE X 2): SPECIAL REQUESTS: ADEQUATE

## 2017-09-26 NOTE — Progress Notes (Signed)
Received a result in my inbox from patient's recent hospitalization for weakness. She had a blood culture drawn that grew Peptoniphilus asaccharolyticus and actinobaculum schaalii in 1/2 bottles (anaerobic bottle). While in the hospital, patient was not have any fevers, leukocytosis, or other infectious symptoms. Discussed with Dr. Tommy Medal (ID) over the phone, who recommended repeat blood cultures. Patient was discharged to [facility] from the hospital yesterday. [employer] and spoke with Levada Dy, Therapist, sports and gave a verbal order to repeat blood cultures. Levada Dy stated that she would take care of it.  Hyman Bible, MD PGY-3

## 2017-09-28 DIAGNOSIS — M797 Fibromyalgia: Secondary | ICD-10-CM | POA: Diagnosis not present

## 2017-09-28 DIAGNOSIS — G4733 Obstructive sleep apnea (adult) (pediatric): Secondary | ICD-10-CM | POA: Diagnosis not present

## 2017-09-28 DIAGNOSIS — D649 Anemia, unspecified: Secondary | ICD-10-CM | POA: Diagnosis not present

## 2017-09-28 DIAGNOSIS — I509 Heart failure, unspecified: Secondary | ICD-10-CM | POA: Diagnosis not present

## 2017-09-28 DIAGNOSIS — I110 Hypertensive heart disease with heart failure: Secondary | ICD-10-CM | POA: Diagnosis not present

## 2017-09-28 DIAGNOSIS — J449 Chronic obstructive pulmonary disease, unspecified: Secondary | ICD-10-CM | POA: Diagnosis not present

## 2017-09-28 DIAGNOSIS — E669 Obesity, unspecified: Secondary | ICD-10-CM | POA: Diagnosis not present

## 2017-09-28 DIAGNOSIS — E119 Type 2 diabetes mellitus without complications: Secondary | ICD-10-CM | POA: Diagnosis not present

## 2017-09-29 ENCOUNTER — Other Ambulatory Visit: Payer: Self-pay | Admitting: *Deleted

## 2017-09-29 ENCOUNTER — Telehealth: Payer: Self-pay | Admitting: *Deleted

## 2017-09-29 NOTE — Patient Outreach (Signed)
[place] [facility]) Care Management  09/29/2017  KAROLINE FLEER 24-Nov-1961 [id number]   CSW was able to meet with patient today at [facility] [place] where patient currently resides to receive short-term rehabilitative services.  Patient was very upset at the time of CSW's visit, reporting that she is not getting her medications as prescribed.  Patient went on to say that she has not slept in 4 nights because her Trazodone was decreased from a 300 MG tablet to a 100 MG tablet, while hospitalized.  Patient further reported that she is not getting her Oxycodone 10-325 MG tablets every 6 hours as needed for pain.  After thorough review of patient's EMR (Electronic Medical Record) in [facility], CSW noted that patient was prescribed the following, according to her discharge instruction sheet: *  oxyCODONE-acetaminophen 10-325 MG tablet Take one tablet by mouth every 6 hours as needed for pain *  traZODone 100 MG tablet Take 1 tablet (100 mg total) by mouth at bedtime as needed for sleep Patient was planning to contact her Primary Care Physician, Dr. Hyman Bible to see about getting her Trazodone switched back to a 300 MG tablet, as opposed to the 100 MG tablet she has been prescribed.  In addition, patient was encouraged to speak with the director of nursing at [facility] to find out why she is not receiving the Oxycodone 10-325 MG tablet every 6 hours, as prescribed for pain.  Patient agreed to report findings to CSW. Patient admitted that she has been refusing to work with therapies due to being weak, tired, fatigued and in immense pain.  Patient is hopeful to get her medications straightened out soon so that she can participate with therapies (both physical and occupational).  CSW offered counseling and supportive services, where appropriate.  CSW agreed to follow-up with patient in two weeks to assess and assist with discharge  planning needs and services. [name], [name], [name], [name]  Licensed Education officer, [facility]  Mailing [place] [address], [place], [place] [address] Physical Address-[address], [place], [place] [address] Toll Free Main # [phone] Fax # [phone] Cell # [phone]  Office # [phone] [email or web address]

## 2017-09-29 NOTE — Telephone Encounter (Signed)
Will forward to MD to advise. Kaniel Kiang,CMA  

## 2017-09-29 NOTE — Telephone Encounter (Signed)
Patient brother [name] states patient is now in a facility [facility] in [place]) and has been there for the past 3-4 days. [name] states that they have only been giving patient 100mg  of trazadone at night and patient has not slept since she has been there and it is effecting her mental state. He states that the last time he was here he asked Dr. Brett Albino if it was okay for her to take 300mg  of trazadone at night with her oxycodone to help with sleep and Dr. Brett Albino said this was ok, however, at [facility] they states Dr. Brett Albino still only has her on 100mg  of the trazadone. Please give him a call at [phone]

## 2017-09-29 NOTE — Telephone Encounter (Signed)
Patient's brother left message on nurse line regarding patient has been in SNF. Please see previous message.Derl Barrow, RN

## 2017-09-30 DIAGNOSIS — K5900 Constipation, unspecified: Secondary | ICD-10-CM | POA: Diagnosis not present

## 2017-09-30 DIAGNOSIS — J9692 Respiratory failure, unspecified with hypercapnia: Secondary | ICD-10-CM | POA: Diagnosis not present

## 2017-09-30 NOTE — Telephone Encounter (Signed)
Per review of notes there was a change in patient's Trazodone back in August. Given I don't have further documentation, Ms. Tschetter will have to wait until PCP returns next week to discuss this issue. Discussed with Dr. Nori Riis.  Geni Bers

## 2017-10-01 NOTE — Telephone Encounter (Signed)
Contacted pts brother to discuss. I informed Kathleen Roy that Dr. Brett Albino was out of the country and will return next week. Kathleen Roy was informed she will call him in regards to pts medication. Kathleen Roy was appreciative for the call back, he understood and will await phone call from pcp.

## 2017-10-06 ENCOUNTER — Other Ambulatory Visit: Payer: Self-pay

## 2017-10-06 NOTE — Patient Outreach (Signed)
[place] [facility]) Care Management  10/06/17  SKYE PLAMONDON 1961/01/27 [id number]  Patient was discharged to SNF level of care on 09/25/2017. RNCM to perform case closure. Patient is currently being followed by [facility] for discharge planning needs.  [place] R. Innocence Schlotzhauer, RN, BSN, [place] Management Coordinator [phone]

## 2017-10-07 ENCOUNTER — Inpatient Hospital Stay: Payer: Medicare Other | Admitting: Internal Medicine

## 2017-10-07 ENCOUNTER — Other Ambulatory Visit: Payer: Self-pay | Admitting: Internal Medicine

## 2017-10-07 MED ORDER — TRAZODONE HCL 300 MG PO TABS: 300.0000 mg | ORAL_TABLET | Freq: Every day | ORAL | 0 refills | Status: AC

## 2017-10-09 DIAGNOSIS — K5900 Constipation, unspecified: Secondary | ICD-10-CM | POA: Diagnosis not present

## 2017-10-09 DIAGNOSIS — G4733 Obstructive sleep apnea (adult) (pediatric): Secondary | ICD-10-CM | POA: Diagnosis not present

## 2017-10-09 DIAGNOSIS — J9692 Respiratory failure, unspecified with hypercapnia: Secondary | ICD-10-CM | POA: Diagnosis not present

## 2017-10-09 DIAGNOSIS — R5381 Other malaise: Secondary | ICD-10-CM | POA: Diagnosis not present

## 2017-10-13 ENCOUNTER — Ambulatory Visit: Payer: Self-pay | Admitting: *Deleted

## 2017-10-14 ENCOUNTER — Other Ambulatory Visit: Payer: Self-pay | Admitting: *Deleted

## 2017-10-14 NOTE — Patient Outreach (Signed)
[place] [facility]) Care Management  10/14/2017  Kathleen Roy February 13, 1961 [id number]   CSW was able to meet with patient at [facility] today, [name] where patient currently resides to receive short-term rehabilitative services today to perform a routine visit.  Also present during the visit was patient's brother, Kathleen Roy.  Patient admitted that she is not ready to be discharged back home with [name], as she is still unable to stand without maximum assistance and unable to walk, even taking one step.  Patient reports being very weak and fatigued, having gotten deconditioned after being discharged from the hospital.  Patient is still not willing to consider long-term care placement at [facility], or any other skilled nursing facility.  Patient is adamant about returning home, even though she is still unable to perform activities of daily living independently.  Patient is very dependent upon [name] to assist her with bathing, dressing, meal preparation, medication management, etc.  Patient believes that as long as she is able to stand, with the aide of her walker, and walk a few feet, from her living room to her bedroom, that [name] will be able to continue to manage her care.  [name] admitted that he is unable to care for patient in the current condition in which she is in.  Patient did not know of a tentative discharge date, as of yet, reporting, "they typically tell me the day before I am going to be released".  Patient believes that she will be at the facility for at least a couple more weeks, realizing she needs to work very hard with therapies (both physical and occupational) to regain her strength and mobility.  CSW agreed to meet with patient again in two weeks to assess and assist with discharge planning needs and services.  Patient is agreeable to going home with home health services (i.e. Nursing, physical therapy, occupational therapy  and an aide), as well as durable medical equipment, if recommended.  Patient has been encouraged to think about transportation to and from her physician appointments, as patient was previously using non-emergency medical services to transport her, which is becoming quite expensive at $500.00 a transport.  Patient is unable to walk to [name] vehicle, nor is patient able to fit inside the passenger seat.  CSW offered counseling and supportive services, where appropriate. [name], [name], [name], [name]  Licensed Education officer, environmental Health System  Mailing [place] [address] [place], [place] [address] Physical Address-[address], [place], [place] [address] Toll Free Main # [phone] Fax # [phone] Cell # [phone]  Office # [phone] [email or web address]

## 2017-10-16 DIAGNOSIS — R52 Pain, unspecified: Secondary | ICD-10-CM | POA: Diagnosis not present

## 2017-10-16 DIAGNOSIS — M25572 Pain in left ankle and joints of left foot: Secondary | ICD-10-CM | POA: Diagnosis not present

## 2017-10-16 DIAGNOSIS — M25472 Effusion, left ankle: Secondary | ICD-10-CM | POA: Diagnosis not present

## 2017-10-21 DIAGNOSIS — R52 Pain, unspecified: Secondary | ICD-10-CM | POA: Diagnosis not present

## 2017-10-21 DIAGNOSIS — M25472 Effusion, left ankle: Secondary | ICD-10-CM | POA: Diagnosis not present

## 2017-10-21 DIAGNOSIS — M25572 Pain in left ankle and joints of left foot: Secondary | ICD-10-CM | POA: Diagnosis not present

## 2017-10-23 DIAGNOSIS — G4733 Obstructive sleep apnea (adult) (pediatric): Secondary | ICD-10-CM | POA: Diagnosis not present

## 2017-10-23 DIAGNOSIS — R5381 Other malaise: Secondary | ICD-10-CM | POA: Diagnosis not present

## 2017-10-23 DIAGNOSIS — M25472 Effusion, left ankle: Secondary | ICD-10-CM | POA: Diagnosis not present

## 2017-10-23 DIAGNOSIS — M25572 Pain in left ankle and joints of left foot: Secondary | ICD-10-CM | POA: Diagnosis not present

## 2017-10-23 DIAGNOSIS — R52 Pain, unspecified: Secondary | ICD-10-CM | POA: Diagnosis not present

## 2017-10-26 DIAGNOSIS — D649 Anemia, unspecified: Secondary | ICD-10-CM | POA: Diagnosis not present

## 2017-10-26 DIAGNOSIS — J449 Chronic obstructive pulmonary disease, unspecified: Secondary | ICD-10-CM | POA: Diagnosis not present

## 2017-10-26 DIAGNOSIS — E119 Type 2 diabetes mellitus without complications: Secondary | ICD-10-CM | POA: Diagnosis not present

## 2017-10-26 DIAGNOSIS — I509 Heart failure, unspecified: Secondary | ICD-10-CM | POA: Diagnosis not present

## 2017-10-26 DIAGNOSIS — G4733 Obstructive sleep apnea (adult) (pediatric): Secondary | ICD-10-CM | POA: Diagnosis not present

## 2017-10-28 ENCOUNTER — Other Ambulatory Visit: Payer: Self-pay | Admitting: *Deleted

## 2017-10-28 DIAGNOSIS — I509 Heart failure, unspecified: Secondary | ICD-10-CM | POA: Diagnosis not present

## 2017-10-28 DIAGNOSIS — G4733 Obstructive sleep apnea (adult) (pediatric): Secondary | ICD-10-CM | POA: Diagnosis not present

## 2017-10-28 DIAGNOSIS — I110 Hypertensive heart disease with heart failure: Secondary | ICD-10-CM | POA: Diagnosis not present

## 2017-10-28 NOTE — Patient Outreach (Signed)
[place] [facility]) Care Management  10/28/2017  [name] [birth year] [id number]   CSW was able to meet with patient today at [facility], [place] where patient currently resides to receive short-term rehabilitative services, to perform a routine visit.  Patient stated, "I'm not in the best of spirits today".  Patient went on to say that she has been unable to work with therapies, both physical and occupational, due to experiencing a great deal of pain in her legs and knees.  Patient admitted that it has been several days since she has been able to work with therapies; therefore, she fears that her insurance will begin denying coverage if she is unable to participate soon.  Patient is awaiting an orthopedic consult, which will take place while she is residing in the skilled facility.  Patient also admitted that she is fearful that she will not be able to walk again.  CSW offered counseling and supportive services, where appropriate. Patient talked a lot about her brother, Kathleen Roy and how she worries a lot about his health condition.  CSW inquired as to whether or not patient and [name] would be willing to consider assisted living for the both of them, once patient is discharged from [facility].  Patient was adamant about being able to return home, even though she is unable to perform activities of daily living independently and is completely wheelchair bound at present.  Patient agreed to discuss this options with [name], but doubted he would be agreeable.  CSW agreed to follow-up with patient again in two weeks, but encouraged patient to contact CSW directly if social work needs arise in the meantime.  Patient voiced understanding and was agreeable to this plan. [facility] CM Care Plan Problem One     Most Recent Value  Care Plan Problem One  Patient is unable to perform activities of daily living independently.  Role  Documenting the Problem One  Clinical Social Worker  Care Plan for Problem One  Active  [name] Long Term Goal   Patient will receive skilled nursing placement for short-term rehabilitative services, within the next 45 days.  [facility] Long Term Goal Start Date  09/23/17  [facility] Long Term Goal Met Date  09/29/17  Interventions for Problem One Long Term Goal  CSW will assist patient with placement into a skilled nursing facility for rehabiitative services.  [facility] CM Short Term Goal #1   CSW will request a completed and signed FL-2 Form from patient's primary care physician, within the next two weeks.  [facility] CM Short Term Goal #1 Start Date  09/23/17  [facility] CM Short Term Goal #1 Met Date  09/29/17  Interventions for Short Term Goal #1  CSW will pick up the completed and signed FL-2 Form from patient's primary care physician's office.  [facility] CM Short Term Goal #2   CSW will fax patient's completed and signed FL-2 Form to all facilities of choice, within the next two weeks.  [facility] CM Short Term Goal #2 Start Date  09/23/17  [facility] CM Short Term Goal #2 Met Date  09/29/17  Interventions for Short Term Goal #2  CSW will try and obtain bed offers for patient in skilled nursing settings.    [facility] CM Care Plan Problem Two     Most Recent Value  Care Plan Problem Two  Patient will require home care services in the home, upon discharge from the skilled nursing facility.  Role  Documenting the Problem Two  Clinical Social Worker  Care Plan for Problem Two  Active  [facility] CM Short Term Goal #1   Patient will decide on a home health agency of choice, with regards to home care services, within the next 20 days.  [facility] CM Short Term Goal #1 Start Date  10/28/17  Interventions for Short Term Goal #2   CSW will assist patient and social worker at skilled nursing facility with arranging home care services for patient, prior to patient being discharged home.     [name], [name], [name], [name]  Licensed [facility]  Mailing [place] [address] [place], [place] [address] Physical Address-[address], [place], [place] [address] Toll Free Main # [phone] Fax # [phone] Cell # [phone]  Office # [phone] [name]

## 2017-10-31 ENCOUNTER — Telehealth: Payer: Self-pay | Admitting: Internal Medicine

## 2017-10-31 NOTE — Telephone Encounter (Signed)
[facility] called and said they have received the referral for in home care and they will begin in home care on 11/04/17 for this pt.

## 2017-10-31 NOTE — Telephone Encounter (Signed)
Great, thanks

## 2017-10-31 NOTE — Telephone Encounter (Signed)
Will forward to MD to make her aware.  Kathleen Roy,CMA  

## 2017-11-01 ENCOUNTER — Other Ambulatory Visit: Payer: Self-pay

## 2017-11-01 ENCOUNTER — Encounter (HOSPITAL_COMMUNITY): Payer: Self-pay

## 2017-11-01 ENCOUNTER — Emergency Department (HOSPITAL_COMMUNITY): Payer: Medicare Other

## 2017-11-01 ENCOUNTER — Emergency Department (HOSPITAL_COMMUNITY)
Admission: EM | Admit: 2017-11-01 | Discharge: 2017-11-01 | Disposition: A | Discharge status: Home / Self Care | Payer: Medicare Other | Attending: Physician Assistant | Admitting: Physician Assistant

## 2017-11-01 DIAGNOSIS — Z9049 Acquired absence of other specified parts of digestive tract: Secondary | ICD-10-CM | POA: Diagnosis not present

## 2017-11-01 DIAGNOSIS — E119 Type 2 diabetes mellitus without complications: Secondary | ICD-10-CM | POA: Diagnosis not present

## 2017-11-01 DIAGNOSIS — F329 Major depressive disorder, single episode, unspecified: Secondary | ICD-10-CM | POA: Insufficient documentation

## 2017-11-01 DIAGNOSIS — J449 Chronic obstructive pulmonary disease, unspecified: Secondary | ICD-10-CM | POA: Diagnosis not present

## 2017-11-01 DIAGNOSIS — R531 Weakness: Secondary | ICD-10-CM | POA: Diagnosis not present

## 2017-11-01 DIAGNOSIS — I110 Hypertensive heart disease with heart failure: Secondary | ICD-10-CM | POA: Diagnosis not present

## 2017-11-01 DIAGNOSIS — M79609 Pain in unspecified limb: Secondary | ICD-10-CM | POA: Diagnosis not present

## 2017-11-01 DIAGNOSIS — G8929 Other chronic pain: Secondary | ICD-10-CM | POA: Insufficient documentation

## 2017-11-01 DIAGNOSIS — M25571 Pain in right ankle and joints of right foot: Secondary | ICD-10-CM | POA: Insufficient documentation

## 2017-11-01 DIAGNOSIS — I5032 Chronic diastolic (congestive) heart failure: Secondary | ICD-10-CM | POA: Diagnosis not present

## 2017-11-01 DIAGNOSIS — Z87891 Personal history of nicotine dependence: Secondary | ICD-10-CM | POA: Insufficient documentation

## 2017-11-01 DIAGNOSIS — Z79899 Other long term (current) drug therapy: Secondary | ICD-10-CM | POA: Diagnosis not present

## 2017-11-01 DIAGNOSIS — R404 Transient alteration of awareness: Secondary | ICD-10-CM | POA: Diagnosis not present

## 2017-11-01 DIAGNOSIS — R627 Adult failure to thrive: Secondary | ICD-10-CM | POA: Diagnosis not present

## 2017-11-01 DIAGNOSIS — M25572 Pain in left ankle and joints of left foot: Secondary | ICD-10-CM | POA: Diagnosis not present

## 2017-11-01 DIAGNOSIS — Z7401 Bed confinement status: Secondary | ICD-10-CM | POA: Diagnosis not present

## 2017-11-01 MED ORDER — DICLOFENAC SODIUM 1 % TD GEL: 2.0000 g | Freq: Four times a day (QID) | TRANSDERMAL | 0 refills | Status: DC

## 2017-11-01 MED ORDER — OXYCODONE HCL 5 MG PO TABS
10.0000 mg | ORAL_TABLET | Freq: Once | ORAL | Status: AC
  Administered 2017-11-01: 10 mg via ORAL
  Filled 2017-11-01: qty 2

## 2017-11-01 NOTE — ED Provider Notes (Signed)
[facility] Provider Note   CSN: [id number] Arrival date & time: 11/01/17  1636     History   Chief Complaint Chief Complaint  Patient presents with  . Leg Pain    left     HPI Kathleen Roy is a 56 y.o. female.  HPI   Patient is a 56 year old female with a history of morbid obesity, COPD (on home 4 L O2), CHF, arthritis, hyperlipidemia, and hypertension presenting for chronic left ankle pain.  Patient was discharged to [facility] rehab after a hospital stay for chronic hypoxic respiratory failure in October 2018.  Subsequently, approximately 3 weeks ago while she was at rehab she had excruciating left ankle pain upon standing on the left ankle one morning. No associated twisting or injury. Patient reports that since this time she has been almost unable to bear weight, first on left but now also affecting right ankle.  Patient reports she is unable to ambulate distances.  Patient has been taking Percocet for her arthritic pain, per her PCP, but it does not completely improve the ankle pain. Patient reports she had a workup at [facility] rehab consisting of 2 x-rays which she reports are normal (not in system).  Patient denies any fever, chills, increased LE edema, erythema, or focal tenderness to the ankle.  Patient reports chronic arthritis in the bilateral knees.  Past Medical History:  Diagnosis Date  . Arthritis    "I feel like it's everywhere" (09/24/2017  . Cellulitis and abscess of lower extremity 08/16/2016   right leg  . CHF (congestive heart failure) ([place])   . Chronic lower back pain   . COPD (chronic obstructive pulmonary disease) ([place])   . Depression   . Fibromyalgia   . Headache    "once q couple months now" (09/24/2017)  . Hyperlipidemia   . Hypertension   . HYPERTENSION, BENIGN SYSTEMIC 02/12/2007  . Memory disturbance   . Migraines 1970's - <2000   "they just went away"  . Neuropathy    "bad in my right hand and in both  feet" (09/24/2017)  . Normal echocardiogram 05/30/05   suboptimal study  . Obesity, morbid (more than 100 lbs over ideal weight or BMI > 40) (HCC)    obese since childhood  . On home oxygen therapy    "4L; 24/7" (09/24/2017)  . Osteoarthritis   . Peripheral edema   . Pressure ulcer of foot    left  . Sepsis ([place])   . Shortness of breath dyspnea   . Sleep apnea 10/2014   "couldn't tolerate the mask" (09/24/2017)  . Type II diabetes mellitus [facility])     Patient Active Problem List   Diagnosis Date Noted  . Candidal dermatitis 09/25/2017  . Requires daily assistance for activities of daily living (ADL) and comfort needs   . Weakness 09/24/2017  . Other fatigue   . Altered mental status 09/20/2017  . 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 Allergic rhinitis 08/15/2017  . Immobility 08/15/2017  . Fibromyalgia 06/16/2017  . Chronic pain of both knees   . Acute respiratory failure with hypercapnia ([place]) 10/15/2016  . Type 2 diabetes mellitus ([place]) 02/14/2016  . Chronic diastolic congestive heart failure ([place])   . Essential hypertension   . OSA (obstructive sleep apnea)   . Obesity hypoventilation syndrome ([place])   . Chronic respiratory failure with hypoxia ([place])   . Hyperglycemia 11/08/2014  . Memory loss 03/18/2014  . Venous stasis dermatitis of both lower extremities 12/02/2012  .  COPD (chronic obstructive pulmonary disease) ([place]) 10/15/2011  . Peripheral neuropathy ([place]) 10/15/2011  . BACK PAIN, LUMBAR, CHRONIC 01/22/2011  . Morbid obesity with BMI of 70 and over, adult ([place]) 11/16/2007  . Major depressive disorder, recurrent episode ([place]) 02/12/2007    Past Surgical History:  Procedure Laterality Date  . ABDOMINAL HERNIA REPAIR  2002  . APPENDECTOMY  1995  . CHOLECYSTECTOMY OPEN  1995  . HERNIA REPAIR    . TOTAL ABDOMINAL HYSTERECTOMY  1994    OB History    Gravida Para Term Preterm AB Living   0 0 0 0 0 0   SAB TAB Ectopic Multiple Live Births   0 0 0 0         Home  Medications    Prior to Admission medications   Medication Sig Start Date End Date Taking? Authorizing Provider  albuterol (PROAIR HFA) 108 (90 Base) MCG/ACT inhaler Inhale 2 puffs into the lungs every 6 (six) hours as needed for wheezing or shortness of breath.   Yes [provider]  ARIPiprazole (ABILIFY) 2 MG tablet Take 1 tablet (2 mg total) by mouth every other day. 12/18/16  Yes Mayo, Pete Pelt, MD  Ascorbic Acid (VITAMIN C) 1000 MG tablet Take 3,000 mg daily by mouth.    Yes [provider]  cyclobenzaprine (FLEXERIL) 10 MG tablet Take 1 tablet (10 mg total) by mouth 3 (three) times daily as needed for muscle spasms. 12/18/16  Yes Mayo, Pete Pelt, MD  fluticasone furoate-vilanterol (BREO ELLIPTA) 100-25 MCG/INH AEPB Inhale 1 puff daily into the lungs.   Yes [provider]  Fluticasone-Salmeterol (ADVAIR) 100-50 MCG/DOSE AEPB Inhale 1 puff into the lungs 2 (two) times daily.   Yes [provider]  naloxone (NARCAN) nasal spray 4 mg/0.1 mL Place 1 spray into the nose once as needed (opiod overdose).   Yes [provider]  nystatin (NYSTATIN) powder Apply topically See admin instructions. Apply topically to affected area as needed for rash/itching   Yes [provider]  Omega-3 Fatty Acids (FISH OIL) 1000 MG CAPS Take 2 capsules (2,000 mg total) by mouth at bedtime. 06/10/16  Yes Mayo, Pete Pelt, MD  oxyCODONE-acetaminophen (PERCOCET) 10-325 MG tablet Take one tablet by mouth every 6 hours as needed for pain Patient taking differently: Take 1 tablet every 6 (six) hours as needed by mouth for pain.  09/23/17  Yes Mayo, Pete Pelt, MD  OXYGEN Inhale 4 L into the lungs continuous.    Yes [provider]  polyethylene glycol (MIRALAX / GLYCOLAX) packet Take 17 g by mouth daily. Mix in 8 oz liquid and drink   Yes [provider]  potassium chloride SA (K-DUR,KLOR-CON) 20 MEQ tablet Take 2 tablets once a day. Patient taking  differently: Take 40 mEq at bedtime by mouth.  03/04/17  Yes Mayo, Pete Pelt, MD  pregabalin (LYRICA) 150 MG capsule Take 1 capsule (150 mg total) by mouth 2 (two) times daily. 07/28/17  Yes Gerlene Fee, NP  torsemide (DEMADEX) 20 MG tablet Take 40 mg by mouth 2 (two) times daily.   Yes [provider]  traZODone (DESYREL) 150 MG tablet Take 300 mg at bedtime by mouth.   Yes [provider]  venlafaxine XR (EFFEXOR-XR) 150 MG 24 hr capsule Take 300 mg by mouth daily.    Yes [provider]  atorvastatin (LIPITOR) 10 MG tablet Take 1 tablet (10 mg total) by mouth at bedtime. Patient not taking: Reported on 11/01/2017  03/27/17   Mayo, Pete Pelt, MD  cetirizine (ZYRTEC) 10 MG tablet Take 1 tablet (10 mg total) by mouth daily. 08/15/17   Mayo, Pete Pelt, MD  diclofenac sodium (VOLTAREN) 1 % GEL Apply 2 g 4 (four) times daily topically. 11/01/17   Langston Masker B, PA-C  trazodone (DESYREL) 300 MG tablet Take 1 tablet (300 mg total) by mouth at bedtime. Patient not taking: Reported on 11/01/2017 10/07/17   Mayo, Pete Pelt, MD    Family History Family History  Problem Relation Age of Onset  . Stroke Mother   . Dementia Father   . Prostate cancer Father   . Congestive Heart Failure Brother   . Diabetes Brother   . Hypertension Brother     Social History Social History   Tobacco Use  . Smoking status: Former Smoker    Packs/day: 1.00    Years: 30.00    Pack years: 30.00    Types: Cigarettes    Last attempt to quit: 01/05/2009    Years since quitting: 8.8  . Smokeless tobacco: Never Used  Substance Use Topics  . Alcohol use: No    Alcohol/week: 0.0 oz  . Drug use: No     Allergies   Aspirin   Review of Systems Review of Systems  Constitutional: Negative for chills and fever.  HENT: Negative for congestion, rhinorrhea, sinus pain and sore throat.   Eyes: Negative for visual disturbance.  Respiratory: Negative for cough, chest tightness and  shortness of breath.   Cardiovascular: Negative for chest pain and leg swelling.  Gastrointestinal: Negative for abdominal pain, diarrhea, nausea and vomiting.  Musculoskeletal: Positive for arthralgias and myalgias.  Skin: Negative for rash.  Neurological: Negative for dizziness, syncope, light-headedness and headaches.     Physical Exam Updated Vital Signs BP 127/68   Pulse 85   Temp 98.3 F (36.8 C) (Oral)   Resp 18   SpO2 100%   Physical Exam  Constitutional: She appears well-developed and well-nourished. No distress.  Morbidly obese female lying on bed in no acute distress.  HENT:  Head: Normocephalic and atraumatic.  Mouth/Throat: Oropharynx is clear and moist.  Eyes: Conjunctivae and EOM are normal. Pupils are equal, round, and reactive to light.  Neck: Normal range of motion. Neck supple.  Cardiovascular: Normal rate, regular rhythm, S1 normal and S2 normal.  No murmur heard. Pulmonary/Chest: Effort normal and breath sounds normal. She has no wheezes. She has no rales.  Abdominal: Soft. She exhibits no distension. There is no tenderness. There is no guarding.  Musculoskeletal: Normal range of motion. She exhibits no edema or deformity.  Edematous ankles bilaterally.  Patient exhibits  pain with flexion and extension of bilateral ankles.  Pain of left ankle predominantly with inversion compared to eversion.  No pain with inversion or eversion of right ankle. Patient able to stand with assist at bedside, but produces pain. DP and PT pulses intact bilaterally, verified with Doppler.  Neurological: She is alert.  Cranial nerves grossly intact. Patient moves extremities symmetrically and with good coordination.  Skin: Skin is warm and dry. No rash noted. No erythema.  Psychiatric: She has a normal mood and affect. Her behavior is normal. Judgment and thought content normal.  Nursing note and vitals reviewed.    ED Treatments / Results  Labs (all labs ordered are  listed, but only abnormal results are displayed) Labs Reviewed - No data to display  EKG  EKG Interpretation None       Radiology  Dg Ankle Complete Left  Result Date: 11/01/2017 CLINICAL DATA:  Difficulty ambulating.  Ankle pain. EXAM: LEFT ANKLE COMPLETE - 3+ VIEW COMPARISON:  02/15/2015 FINDINGS: No acute fracture nor joint dislocations. Enlarged body habitus with soft tissue thickening over the lateral malleolus. The bones are slightly demineralized in appearance. There are subchondral degenerate cystic change and sclerosis involving the mid foot more so the navicular. Calcifications are seen along plantar aponeurosis possibly from chronic fasciitis. IMPRESSION: 1. Soft tissue calcifications along the plantar fascia that may reflect stigmata of chronic fasciitis. 2. Mild osteopenia likely from disuse. Mild osteoarthritic change of the included ankle and foot. Electronically Signed   By: Ashley Royalty M.D.   On: 11/01/2017 19:05   Dg Ankle Complete Right  Result Date: 11/01/2017 CLINICAL DATA:  Ankle pain.  Difficulty moving without assistance. EXAM: RIGHT ANKLE - COMPLETE 3+ VIEW COMPARISON:  Foot radiographs from 11/16/2007 FINDINGS: Prominence of soft tissues consistent with the patient's enlarged body habitus. The bones are more demineralized in appearance since prior. This may be from disuse. Osteoarthritic changes about the tibiotalar, subtalar and midfoot articulations of the ankle and foot. No joint dislocation, joint effusion nor acute fracture. Tiny calcaneal enthesophytes are present. No acute displaced fracture or suspicious osseous lesions. IMPRESSION: 1. Osteoarthritis of the ankle and midfoot. 2. No acute osseous abnormality or joint dislocations. 3. Calcaneal enthesophytes. 4. Probable disuse osteopenia since prior. Electronically Signed   By: Ashley Royalty M.D.   On: 11/01/2017 19:02    Procedures Procedures (including critical care time)  Medications Ordered in  ED Medications  oxyCODONE (Oxy IR/ROXICODONE) immediate release tablet 10 mg (10 mg Oral Given 11/01/17 2018)     Initial Impression / Assessment and Plan / ED Course  I have reviewed the triage vital signs and the nursing notes.  Pertinent labs & imaging results that were available during my care of the patient were reviewed by me and considered in my medical decision making (see chart for details).     Final Clinical Impressions(s) / ED Diagnoses   Final diagnoses:  Chronic pain of both ankles   Patient is afebrile, nontoxic-appearing and in no acute distress at rest.  Patient called EMS today after she was not progressing on her therapy at [facility] rehab and discharged per [facility] rehab and her brother is unable to fully assist her at home. Patient and history is no concerning features today for acute causes of ankle pain such as septic arthritis, osteomyelitis, or cellulitis overlying the ankles.  This is a chronic pain of the ankles, and patient reports that it was worse 3 weeks ago.  X-rays performed today demonstrate osteopenia of bilateral ankles as well as osteoarthritic changes particularly in the left ankle.  Right ankle demonstrates possible chronic plantar fasciitis.  There is no acute illness warranting admission today. I discussed with patient that she requires physical therapy, occupational therapy, and home health, which have not been set up for her when she was discharged from rehab.  I initiated a face-to-face order today, and told patient that she will be notified shortly.  We discussed that patient needs to bear weight on her ankles to prevent further osteopenia.  Prescribed diclofenac gel to put on bilateral ankles, as patient should not take oral NSAIDs due to cardiac history.  Patient lives at home with her brother, and patient is deemed safe for discharge until face-to-face order can be completed.  Return precautions given for any increasing swelling, pain, erythema, or  fever chills  with symptoms.  Patient is in understanding and agrees with the plan of care.  This is a supervised visit with Dr. Zenovia Jarred. Evaluation, management, and discharge planning discussed with this attending physician.   ED Discharge Orders        Ordered    diclofenac sodium (VOLTAREN) 1 % GEL  4 times daily     11/01/17 2208       Albesa Seen, PA-C 11/02/17 0124    Macarthur Critchley, MD 11/02/17 2234

## 2017-11-01 NOTE — ED Triage Notes (Signed)
Patient here from home after staying at [facility] for a month.  Per ortho therapy has had problems ambulating and moving by herself.  Their note stated patient was full assist.  Patient on Titian tarp only complaint is of left leg and ankle pain. On 4 L nasal cannula daily.  O2 at 86 % on room air.  Patient is A&Ox4 able to provide history.

## 2017-11-01 NOTE — ED Notes (Signed)
Attempted to stand/transfer/ambulate patient. With the assist of a walker and 2 RN, pt was unable to stand. She says the pain in her left foot/leg is too bad, and also in the right. Gave patient pain meds. She is sitting the bedside with her legs on the side eating a meal. Will notify PA and reassess pt after meal.

## 2017-11-01 NOTE — ED Notes (Signed)
Patient transported to X-ray 

## 2017-11-01 NOTE — ED Notes (Signed)
Attempted to stand pt at bedside, she was unable, alyssa, pa was present during this time for eval

## 2017-11-01 NOTE — Discharge Instructions (Signed)
Please see the information and instructions below regarding your visit.  Your diagnoses today include:  1. Chronic pain of both ankles    Your ankle pain is likely caused by osteoarthritis.  This requires physical therapy.  I have ordered this to contact you at home.  You should be contacted by a team that will, and do an assessment of you with social work, home health, nursing, physical and occupational therapy.  It is very important that you try to move.  Osteoarthritis gets worse the longer we let the bones degenerate.  Please wrap your ankles daily.  Tests performed today include: See side panel of your discharge paperwork for testing performed today. Vital signs are listed at the bottom of these instructions.   X-rays of bilateral ankles.  Medications prescribed:    Take any prescribed medications only as prescribed, and any over the counter medications only as directed on the packaging.  I prescribed diclofenac gel.  Please do not apply this more than 4 times a day.  Home care instructions:  Please follow any educational materials contained in this packet.   Follow-up instructions: Please follow-up with your primary care provider in as soon as possible for further evaluation of your symptoms if they are not completely improved.   I provided a new orthopedic referral for you.  This is who is on-call for the emergency department.  He may contact them for further care.  Return instructions:  Please return to the Emergency Department if you experience worsening symptoms.  Please return the emergency department for any increasing swelling in your legs, redness around her ankles, fevers or chills with your joint pain. Please return if you have any other emergent concerns.  Additional Information:   Your vital signs today were: BP 127/68    Pulse 85    Temp 98.3 F (36.8 C) (Oral)    Resp 18    SpO2 100%  If your blood pressure (BP) was elevated on multiple readings during this  visit above 130 for the top number or above 80 for the bottom number, please have this repeated by your primary care provider within one month. --------------  Thank you for allowing [place] to participate in your care today.

## 2017-11-03 ENCOUNTER — Other Ambulatory Visit: Payer: Self-pay | Admitting: *Deleted

## 2017-11-03 ENCOUNTER — Telehealth: Payer: Self-pay | Admitting: Internal Medicine

## 2017-11-03 ENCOUNTER — Encounter (HOSPITAL_COMMUNITY): Payer: Self-pay

## 2017-11-03 ENCOUNTER — Other Ambulatory Visit: Payer: Self-pay

## 2017-11-03 ENCOUNTER — Encounter: Payer: Self-pay | Admitting: *Deleted

## 2017-11-03 ENCOUNTER — Emergency Department (HOSPITAL_COMMUNITY)
Admission: EM | Admit: 2017-11-03 | Discharge: 2017-11-04 | Disposition: A | Discharge status: Home / Self Care | Payer: Medicare Other | Attending: Emergency Medicine | Admitting: Emergency Medicine

## 2017-11-03 DIAGNOSIS — Z79899 Other long term (current) drug therapy: Secondary | ICD-10-CM | POA: Insufficient documentation

## 2017-11-03 DIAGNOSIS — M25572 Pain in left ankle and joints of left foot: Secondary | ICD-10-CM | POA: Insufficient documentation

## 2017-11-03 DIAGNOSIS — R262 Difficulty in walking, not elsewhere classified: Secondary | ICD-10-CM | POA: Insufficient documentation

## 2017-11-03 DIAGNOSIS — M25571 Pain in right ankle and joints of right foot: Secondary | ICD-10-CM | POA: Insufficient documentation

## 2017-11-03 DIAGNOSIS — E119 Type 2 diabetes mellitus without complications: Secondary | ICD-10-CM | POA: Insufficient documentation

## 2017-11-03 DIAGNOSIS — R600 Localized edema: Secondary | ICD-10-CM | POA: Insufficient documentation

## 2017-11-03 DIAGNOSIS — Z9981 Dependence on supplemental oxygen: Secondary | ICD-10-CM | POA: Insufficient documentation

## 2017-11-03 DIAGNOSIS — R531 Weakness: Secondary | ICD-10-CM | POA: Diagnosis not present

## 2017-11-03 DIAGNOSIS — J449 Chronic obstructive pulmonary disease, unspecified: Secondary | ICD-10-CM | POA: Insufficient documentation

## 2017-11-03 DIAGNOSIS — Z87891 Personal history of nicotine dependence: Secondary | ICD-10-CM | POA: Insufficient documentation

## 2017-11-03 DIAGNOSIS — R404 Transient alteration of awareness: Secondary | ICD-10-CM | POA: Diagnosis not present

## 2017-11-03 DIAGNOSIS — I110 Hypertensive heart disease with heart failure: Secondary | ICD-10-CM | POA: Insufficient documentation

## 2017-11-03 DIAGNOSIS — I5032 Chronic diastolic (congestive) heart failure: Secondary | ICD-10-CM | POA: Insufficient documentation

## 2017-11-03 LAB — CBC WITH DIFFERENTIAL/PLATELET
Basophils Absolute: 0 10*3/uL (ref 0.0–0.1)
Basophils Relative: 0 %
EOS ABS: 0 10*3/uL (ref 0.0–0.7)
Eosinophils Relative: 0 %
HEMATOCRIT: 35.7 % — AB (ref 36.0–46.0)
HEMOGLOBIN: 10.7 g/dL — AB (ref 12.0–15.0)
Lymphocytes Relative: 19 %
Lymphs Abs: 1.2 10*3/uL (ref 0.7–4.0)
MCH: 28.7 pg (ref 26.0–34.0)
MCHC: 30 g/dL (ref 30.0–36.0)
MCV: 95.7 fL (ref 78.0–100.0)
MONOS PCT: 8 %
Monocytes Absolute: 0.5 10*3/uL (ref 0.1–1.0)
NEUTROS ABS: 4.4 10*3/uL (ref 1.7–7.7)
NEUTROS PCT: 73 %
Platelets: 188 10*3/uL (ref 150–400)
RBC: 3.73 MIL/uL — AB (ref 3.87–5.11)
RDW: 15.6 % — ABNORMAL HIGH (ref 11.5–15.5)
WBC: 6.1 10*3/uL (ref 4.0–10.5)

## 2017-11-03 LAB — BASIC METABOLIC PANEL
ANION GAP: 10 (ref 5–15)
BUN: 12 mg/dL (ref 6–20)
CALCIUM: 9 mg/dL (ref 8.9–10.3)
CHLORIDE: 98 mmol/L — AB (ref 101–111)
CO2: 29 mmol/L (ref 22–32)
Creatinine, Ser: 0.6 mg/dL (ref 0.44–1.00)
GFR calc non Af Amer: >60 mL/min (ref >60)
Glucose, Bld: 88 mg/dL (ref 65–99)
Potassium: 3.9 mmol/L (ref 3.5–5.1)
SODIUM: 137 mmol/L (ref 135–145)

## 2017-11-03 NOTE — ED Triage Notes (Signed)
Pt arrives to ED from home with complaints of general weakness since one month ago. Pt states she was discharged 3 days ago from the hospital but has been having difficulty doing things around the house due to weakness, pt also complains of back pain and leg pain. Pt placed in position of comfort with bed locked and lowered, call bell in reach.

## 2017-11-03 NOTE — Progress Notes (Addendum)
Consult request has been received. CSW attempting to follow up at present time from [facility] ED.   Per CM and EDP, pt cannot return home in her current condition and once results of tests come back and final disposition is determined by EDP, per CM APS will have to be called.  Per CM pt has a brother who is also disabled and a resident per notes of [place], cannot be supportive of the pt and thus, the pt has no support. Per CM pt cannot safely return home in her current state.  CSW spoke to the EDP and pt cannot stand up, nor move and per EDP pt is presenting as extremely agitated and is unlikely to leave without acting out.    Per notes pt was recently "kicked out of an ALF due to money issues".    CSW will leave a handoff for the daytime CSW.     Alphonse Guild. Sharla Tankard, [name], LCAS, CSI Clinical Social Worker Ph: [phone]

## 2017-11-03 NOTE — Telephone Encounter (Signed)
[facility] called and said they now need verbal orders asap in order to go into the pt home on 11/20. They need verbal orders by tomorrow for evaluation to treat and PT/OT in order to go into the pt home. They will also need orders for a home health aid with ADL assistance. Please contact Tomasita Crumble with [facility] at [phone]. Their fax number is [phone]. Pt got kicked out of last assisted living facility for money issues and cant do anything on her own. Pt went to hospital 11/17 for pain in her legs/feet. Pt was discharged and taken home. Pt has been sitting in her own urine and feces since Saturday. [facility] spoke with pt about this situation.  Please advise.

## 2017-11-03 NOTE — Telephone Encounter (Signed)
°  Kathleen Roy [phone]  [phone]

## 2017-11-03 NOTE — ED Notes (Signed)
Barrier cream applied to moist, reddened area.

## 2017-11-03 NOTE — ED Provider Notes (Signed)
[facility] EMERGENCY DEPARTMENT Provider Note   CSN: [id number] Arrival date & time: 11/03/17  1612     History   Chief Complaint Chief Complaint  Patient presents with  . Weakness    HPI Kathleen Roy is a 56 y.o. female.  The patient presents for evaluation of inability to walk, and she states that she has been "lying in my feces and urine."  She was evaluated in the emergency department 2 days ago with difficulty walking, and potential arrangements were made for home health evaluation and treatment.  The patient states that she has not yet been seen in her home by home health, and that they plan on seeing her, tomorrow.  She could not wait until then so decided to come here by EMS for evaluation.  She complains of overall weakness.  The patient states that prior to 3 weeks ago she was able to walk fairly well however she had worsening trouble walking since then, related to bilateral ankle pain with standing.  She denies fever, chills, vomiting, dizziness, dysuria, diarrhea or bowel incontinence.   HPI  Past Medical History:  Diagnosis Date  . Arthritis    "I feel like it's everywhere" (09/24/2017  . Cellulitis and abscess of lower extremity 08/16/2016   right leg  . CHF (congestive heart failure) ([place])   . Chronic lower back pain   . COPD (chronic obstructive pulmonary disease) ([place])   . Depression   . Fibromyalgia   . Headache    "once q couple months now" (09/24/2017)  . Hyperlipidemia   . Hypertension   . HYPERTENSION, BENIGN SYSTEMIC 02/12/2007  . Memory disturbance   . Migraines 1970's - <2000   "they just went away"  . Neuropathy    "bad in my right hand and in both feet" (09/24/2017)  . Normal echocardiogram 05/30/05   suboptimal study  . Obesity, morbid (more than 100 lbs over ideal weight or BMI > 40) (HCC)    obese since childhood  . On home oxygen therapy    "4L; 24/7" (09/24/2017)  . Osteoarthritis   . Peripheral edema   . Pressure  ulcer of foot    left  . Sepsis ([place])   . Shortness of breath dyspnea   . Sleep apnea 10/2014   "couldn't tolerate the mask" (09/24/2017)  . Type II diabetes mellitus [facility])     Patient Active Problem List   Diagnosis Date Noted  . Candidal dermatitis 09/25/2017  . Requires daily assistance for activities of daily living (ADL) and comfort needs   . Weakness 09/24/2017  . Other fatigue   . Altered mental status 09/20/2017  . 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 Allergic rhinitis 08/15/2017  . Immobility 08/15/2017  . Fibromyalgia 06/16/2017  . Chronic pain of both knees   . Acute respiratory failure with hypercapnia ([place]) 10/15/2016  . Type 2 diabetes mellitus ([place]) 02/14/2016  . Chronic diastolic congestive heart failure ([place])   . Essential hypertension   . OSA (obstructive sleep apnea)   . Obesity hypoventilation syndrome ([place])   . Chronic respiratory failure with hypoxia ([place])   . Hyperglycemia 11/08/2014  . Memory loss 03/18/2014  . Venous stasis dermatitis of both lower extremities 12/02/2012  . COPD (chronic obstructive pulmonary disease) ([place]) 10/15/2011  . Peripheral neuropathy ([place]) 10/15/2011  . BACK PAIN, LUMBAR, CHRONIC 01/22/2011  . Morbid obesity with BMI of 70 and over, adult ([place]) 11/16/2007  . Major depressive disorder, recurrent episode ([place]) 02/12/2007  Past Surgical History:  Procedure Laterality Date  . ABDOMINAL HERNIA REPAIR  2002  . APPENDECTOMY  1995  . CHOLECYSTECTOMY OPEN  1995  . HERNIA REPAIR    . TOTAL ABDOMINAL HYSTERECTOMY  1994    OB History    Gravida Para Term Preterm AB Living   0 0 0 0 0 0   SAB TAB Ectopic Multiple Live Births   0 0 0 0         Home Medications    Prior to Admission medications   Medication Sig Start Date End Date Taking? Authorizing Provider  albuterol (PROAIR HFA) 108 (90 Base) MCG/ACT inhaler Inhale 2 puffs into the lungs every 6 (six) hours as needed for wheezing or shortness of breath.    [provider]  ARIPiprazole (ABILIFY) 2 MG tablet Take 1 tablet (2 mg total) by mouth every other day. 12/18/16   Mayo, Pete Pelt, MD  Ascorbic Acid (VITAMIN C) 1000 MG tablet Take 3,000 mg daily by mouth.     [provider]  atorvastatin (LIPITOR) 10 MG tablet Take 1 tablet (10 mg total) by mouth at bedtime. Patient not taking: Reported on 11/01/2017 03/27/17   Mayo, Pete Pelt, MD  cetirizine (ZYRTEC) 10 MG tablet Take 1 tablet (10 mg total) by mouth daily. 08/15/17   Mayo, Pete Pelt, MD  cyclobenzaprine (FLEXERIL) 10 MG tablet Take 1 tablet (10 mg total) by mouth 3 (three) times daily as needed for muscle spasms. 12/18/16   Mayo, Pete Pelt, MD  diclofenac sodium (VOLTAREN) 1 % GEL Apply 2 g 4 (four) times daily topically. 11/01/17   Langston Masker B, PA-C  fluticasone furoate-vilanterol (BREO ELLIPTA) 100-25 MCG/INH AEPB Inhale 1 puff daily into the lungs.    [provider]  Fluticasone-Salmeterol (ADVAIR) 100-50 MCG/DOSE AEPB Inhale 1 puff into the lungs 2 (two) times daily.    [provider]  naloxone [facility]) nasal spray 4 mg/0.1 mL Place 1 spray into the nose once as needed (opiod overdose).    [provider]  nystatin (NYSTATIN) powder Apply topically See admin instructions. Apply topically to affected area as needed for rash/itching    [provider]  Omega-3 Fatty Acids (FISH OIL) 1000 MG CAPS Take 2 capsules (2,000 mg total) by mouth at bedtime. 06/10/16   Mayo, Pete Pelt, MD  oxyCODONE-acetaminophen (PERCOCET) 10-325 MG tablet Take one tablet by mouth every 6 hours as needed for pain Patient taking differently: Take 1 tablet every 6 (six) hours as needed by mouth for pain.  09/23/17   Mayo, Pete Pelt, MD  OXYGEN Inhale 4 L into the lungs continuous.     [provider]  polyethylene glycol (MIRALAX / GLYCOLAX) packet Take 17 g by mouth daily. Mix in 8 oz liquid and drink    [provider]  potassium chloride SA (K-DUR,KLOR-CON) 20 MEQ  tablet Take 2 tablets once a day. Patient taking differently: Take 40 mEq at bedtime by mouth.  03/04/17   Mayo, Pete Pelt, MD  pregabalin (LYRICA) 150 MG capsule Take 1 capsule (150 mg total) by mouth 2 (two) times daily. 07/28/17   Gerlene Fee, NP  torsemide (DEMADEX) 20 MG tablet Take 40 mg by mouth 2 (two) times daily.    [provider]  traZODone (DESYREL) 150 MG tablet Take 300 mg at bedtime by mouth.    [provider]  trazodone (DESYREL) 300 MG tablet Take 1 tablet (300 mg total) by mouth at bedtime.  Patient not taking: Reported on 11/01/2017 10/07/17   Sela Hua, MD  venlafaxine XR (EFFEXOR-XR) 150 MG 24 hr capsule Take 300 mg by mouth daily.     [provider]    Family History Family History  Problem Relation Age of Onset  . Stroke Mother   . Dementia Father   . Prostate cancer Father   . Congestive Heart Failure Brother   . Diabetes Brother   . Hypertension Brother     Social History Social History   Tobacco Use  . Smoking status: Former Smoker    Packs/day: 1.00    Years: 30.00    Pack years: 30.00    Types: Cigarettes    Last attempt to quit: 01/05/2009    Years since quitting: 8.8  . Smokeless tobacco: Never Used  Substance Use Topics  . Alcohol use: No    Alcohol/week: 0.0 oz  . Drug use: No     Allergies   Aspirin   Review of Systems Review of Systems  All other systems reviewed and are negative.    Physical Exam Updated Vital Signs BP 129/74 (BP Location: Right Arm)   Pulse 83   Temp 98.2 F (36.8 C) (Oral)   Resp 14   SpO2 100%   Physical Exam  Constitutional: She is oriented to person, place, and time. She appears well-developed. No distress.  Morbidly obese.  The patient is unable to maneuver in the bed, to assist with examination.  HENT:  Head: Normocephalic and atraumatic.  Eyes: Conjunctivae and EOM are normal. Pupils are equal, round, and reactive to light.  Neck: Normal range of motion and  phonation normal. Neck supple.  Cardiovascular: Normal rate and regular rhythm.  Pulmonary/Chest: Effort normal and breath sounds normal. She exhibits no tenderness.  Abdominal: Soft. She exhibits no distension. There is no tenderness. There is no guarding.  Genitourinary:  Genitourinary Comments: There is no visible fecal soiling of the perineal regions.  Musculoskeletal: She exhibits edema (3-4+ bilateral lower leg swelling.).  Neurological: She is alert and oriented to person, place, and time. She exhibits normal muscle tone.  Skin: Skin is warm.  Patient has scattered areas of skin breakdown largely in the intertriginous folds, of her back, lower abdomen, and upper legs, bilaterally.  There are no areas of significant drainage however the skin in the intertriginous folds are moist.  There are no areas of bleeding, or deep wound infections.  There is also generalized mild redness of the lower legs bilaterally which are wrapped in [place]na boots.  Psychiatric: She has a normal mood and affect. Her behavior is normal. Judgment and thought content normal.  Nursing note and vitals reviewed.    ED Treatments / Results  Labs (all labs ordered are listed, but only abnormal results are displayed) Labs Reviewed  BASIC METABOLIC PANEL  CBC WITH DIFFERENTIAL/PLATELET  URINALYSIS, ROUTINE W REFLEX MICROSCOPIC    EKG  EKG Interpretation None       Radiology Dg Ankle Complete Left  Result Date: 11/01/2017 CLINICAL DATA:  Difficulty ambulating.  Ankle pain. EXAM: LEFT ANKLE COMPLETE - 3+ VIEW COMPARISON:  02/15/2015 FINDINGS: No acute fracture nor joint dislocations. Enlarged body habitus with soft tissue thickening over the lateral malleolus. The bones are slightly demineralized in appearance. There are subchondral degenerate cystic change and sclerosis involving the mid foot more so the navicular. Calcifications are seen along plantar aponeurosis possibly from chronic fasciitis. IMPRESSION: 1.  Soft tissue calcifications along the plantar fascia that  may reflect stigmata of chronic fasciitis. 2. Mild osteopenia likely from disuse. Mild osteoarthritic change of the included ankle and foot. Electronically Signed   By: Ashley Royalty M.D.   On: 11/01/2017 19:05   Dg Ankle Complete Right  Result Date: 11/01/2017 CLINICAL DATA:  Ankle pain.  Difficulty moving without assistance. EXAM: RIGHT ANKLE - COMPLETE 3+ VIEW COMPARISON:  Foot radiographs from 11/16/2007 FINDINGS: Prominence of soft tissues consistent with the patient's enlarged body habitus. The bones are more demineralized in appearance since prior. This may be from disuse. Osteoarthritic changes about the tibiotalar, subtalar and midfoot articulations of the ankle and foot. No joint dislocation, joint effusion nor acute fracture. Tiny calcaneal enthesophytes are present. No acute displaced fracture or suspicious osseous lesions. IMPRESSION: 1. Osteoarthritis of the ankle and midfoot. 2. No acute osseous abnormality or joint dislocations. 3. Calcaneal enthesophytes. 4. Probable disuse osteopenia since prior. Electronically Signed   By: Ashley Royalty M.D.   On: 11/01/2017 19:02    Procedures Procedures (including critical care time)  Medications Ordered in ED Medications - No data to display   Initial Impression / Assessment and Plan / ED Course  I have reviewed the triage vital signs and the nursing notes.  Pertinent labs & imaging results that were available during my care of the patient were reviewed by me and considered in my medical decision making (see chart for details).  Clinical Course as of Nov 03 2020  Mon Nov 03, 2017  2020 Additional information was gained from charting, by social work who is helping the patient in the outpatient setting.  They confirm the patient's prior complaints of inability to ambulate, and inability to care for personal needs.  It was their hope that she could receive home health services, but they  agreed with her transfer to the emergency department for evaluation today.  [EW]    Clinical Course User Index [EW] Daleen Bo, MD     Patient Vitals for the past 24 hrs:  BP Temp Temp src Pulse Resp SpO2  11/03/17 1945 (!) 103/53 - - 86 - 99 %  11/03/17 1930 119/71 - - 88 - 100 %  11/03/17 1632 129/74 98.2 F (36.8 C) Oral 83 14 100 %   16: 34-page case manager to assess patient, for stability and need for home health intervention versus placement.    Case discussed with Education officer, museum, they will arrange for intervention and possible placement.     Final Clinical Impressions(s) / ED Diagnoses   Final diagnoses:  Weakness  Bilateral ankle pain, unspecified chronicity  Morbid obesity ([place])    Patient presenting for weakness, persistent, unimproved with recommended treatment and unable to tolerate home management of self.  No evidence for serious bacterial infection metabolic instability or impending vascular collapse.  There is no indication for hospitalization at this time.  Nursing Notes Reviewed/ Care Coordinated Applicable Imaging Reviewed Interpretation of Laboratory Data incorporated into ED treatment  Plan: Observe in the emergency department, and attempt to place.   ED Discharge Orders    None       Daleen Bo, MD 11/04/17 [phone]

## 2017-11-03 NOTE — Telephone Encounter (Signed)
Verbal order given to [facility].  Patient has been on the phone with them all day and was advised by Stanton Kidney to call EMS since brother recently had surgery and is unable to do anything personal care for her.  Will forward to MD to advise as patient may need more permanent placement. [name],CMA

## 2017-11-03 NOTE — Patient Outreach (Signed)
[facility]) Care Management  11/03/2017  Kathleen Roy 02/03/1961 [id number]  CSW received a call from patient and patient's brother, Jakylah Bassinger today, very upset, indicating that patient was discharged from [facility] [place] where patient was residing to receive short-term rehabilitative services, on Saturday, November 01, 2017, with the inability to even stand, let alone walk. Patient reported filing an appeal with the facility, but lost and was told that she would be responsible for paying out of pocket, per day, for each additional day past Saturday.  Patient was quite tearful, admitting that she has been laying in her urine and feces since Saturday evening.  Patient presented to the Emergency Department at [facility] on Saturday evening, with the hopes of receiving skilled nursing placement to further her therapies (both physical and occupational), but was told that she did not meet criteria for hospital admission.  Patient was transported back home by [facility] non-emergency transport service, where she was placed in her recliner, also where she has stayed for the past 48 hours.   Patient reports being unable to stand due to pain associated with Osteoarthritis in both ankles. Patient indicated that she is also experiencing pain and edema in her feet, legs, buttocks and lower back.  Patient is fearful of skin breakdown and feels humiliated about having to sit in her own waste.    Patient reported that the pain she is experiencing is unbearable at times, especially while trying to apply pressure to her feet and ankles.  Patient believes her only solution at this point is to contact a non-emergency ambulance service to transport her back to the emergency department.  CSW was able to make contact with a representative from [facility] to ensure that an order has been placed for patient  to receive home health services.  These services will include all of the following:  home health physical therapy, occupational therapy, nursing and an aide.  However, patient does not believe she can wait for the initial visit, needing immediate relief of her pain symptoms.  Mr. Spike was planning to call 911 around 2:30PM today to have patient transported back to the hospital.  CSW encouraged patient and Mr. Crymes to contact CSW as soon as they are able to report findings.  Both were agreeable and very appreciative of the counseling and supportive services offered while conversing over the phone. [facility] CM Care Plan Problem One     Most Recent Value  Care Plan Problem One  Patient is unable to perform activities of daily living independently.  Role Documenting the Problem One  Clinical Social Worker  Care Plan for Problem One  Active  [facility] Long Term Goal   Patient will receive skilled nursing placement for short-term rehabilitative services, within the next 45 days.  [facility] Long Term Goal Start Date  09/23/17  [facility] Long Term Goal Met Date  09/29/17  Interventions for Problem One Long Term Goal  CSW will assist patient with placement into a skilled nursing facility for rehabiitative services.  [facility] CM Short Term Goal #1   CSW will request a completed and signed FL-2 Form from patient's primary care physician, within the next two weeks.  [facility] CM Short Term Goal #1 Start Date  09/23/17  [facility] CM Short Term Goal #1 Met Date  09/29/17  Interventions for Short Term Goal #1  CSW will pick up the completed and signed FL-2 Form  from patient's primary care physician's office.  [facility] CM Short Term Goal #2   CSW will fax patient's completed and signed FL-2 Form to all facilities of choice, within the next two weeks.  [facility] CM Short Term Goal #2 Start Date  09/23/17  [facility] CM Short Term Goal #2 Met Date  09/29/17  Interventions for Short Term Goal #2  CSW will try and obtain bed offers for patient in skilled nursing settings.     [facility] CM Care Plan Problem Two     Most Recent Value  Care Plan Problem Two  Patient will require home care services in the home, upon discharge from the skilled nursing facility.  Role Documenting the Problem Two  Clinical Social Worker  Care Plan for Problem Two  Active  [facility] CM Short Term Goal #1   Patient will decide on a home health agency of choice, with regards to home care services, within the next 20 days.  [facility] CM Short Term Goal #1 Start Date  10/28/17  [facility] CM Short Term Goal #1 Met Date   11/03/17  Interventions for Short Term Goal #2   CSW will assist patient and social worker at skilled nursing facility with arranging home care services for patient, prior to patient being discharged home.     [name], [name], [name], [name]  Licensed Education officer, [facility]  Mailing [place] [address], [place], [place] [address] Physical Address-[address], [place], [place] [address] Toll Free Main # [phone] Fax # [phone] Cell # [phone]  Office # [phone] [name]

## 2017-11-03 NOTE — ED Notes (Signed)
Ordering bariatric bed for patient.

## 2017-11-03 NOTE — Progress Notes (Signed)
Pt currently active with [facility]  for RN/PT services.  Resumption of care requested. Mary Yonjof, RN of [facility] notified.  No DME needs identified at this time.  

## 2017-11-03 NOTE — ED Notes (Signed)
Blood collected and sent to lab.  Family now at bedside.  No complaints from patient

## 2017-11-04 ENCOUNTER — Encounter: Payer: Self-pay | Admitting: Licensed Clinical Social Worker

## 2017-11-04 ENCOUNTER — Other Ambulatory Visit: Payer: Self-pay | Admitting: *Deleted

## 2017-11-04 ENCOUNTER — Telehealth: Payer: Self-pay | Admitting: Internal Medicine

## 2017-11-04 DIAGNOSIS — G8911 Acute pain due to trauma: Secondary | ICD-10-CM | POA: Diagnosis not present

## 2017-11-04 DIAGNOSIS — R627 Adult failure to thrive: Secondary | ICD-10-CM | POA: Diagnosis not present

## 2017-11-04 MED ORDER — NYSTATIN 100000 UNIT/GM EX POWD: CUTANEOUS | Status: DC | PRN

## 2017-11-04 MED ORDER — TORSEMIDE 20 MG PO TABS
40.0000 mg | ORAL_TABLET | Freq: Two times a day (BID) | ORAL | Status: DC
  Administered 2017-11-04: 40 mg via ORAL
  Filled 2017-11-04: qty 2

## 2017-11-04 MED ORDER — LORATADINE 10 MG PO TABS
10.0000 mg | ORAL_TABLET | Freq: Every day | ORAL | Status: DC
  Administered 2017-11-04: 10 mg via ORAL
  Filled 2017-11-04: qty 1

## 2017-11-04 MED ORDER — CYCLOBENZAPRINE HCL 10 MG PO TABS: 10.0000 mg | ORAL_TABLET | Freq: Three times a day (TID) | ORAL | Status: DC | PRN

## 2017-11-04 MED ORDER — OXYCODONE-ACETAMINOPHEN 5-325 MG PO TABS: 1.0000 | ORAL_TABLET | Freq: Four times a day (QID) | ORAL | Status: DC | PRN

## 2017-11-04 MED ORDER — DICLOFENAC SODIUM 1 % TD GEL: 2.0000 g | Freq: Four times a day (QID) | TRANSDERMAL | Status: DC

## 2017-11-04 MED ORDER — OXYCODONE HCL 5 MG PO TABS
5.0000 mg | ORAL_TABLET | Freq: Four times a day (QID) | ORAL | Status: DC | PRN
  Administered 2017-11-04: 5 mg via ORAL
  Filled 2017-11-04: qty 1

## 2017-11-04 MED ORDER — POTASSIUM CHLORIDE CRYS ER 20 MEQ PO TBCR
40.0000 meq | EXTENDED_RELEASE_TABLET | Freq: Every day | ORAL | Status: DC
  Administered 2017-11-04: 40 meq via ORAL
  Filled 2017-11-04: qty 2

## 2017-11-04 MED ORDER — PREGABALIN 25 MG PO CAPS: 150.0000 mg | ORAL_CAPSULE | Freq: Two times a day (BID) | ORAL | Status: DC

## 2017-11-04 MED ORDER — OXYCODONE-ACETAMINOPHEN 10-325 MG PO TABS: 1.0000 | ORAL_TABLET | Freq: Four times a day (QID) | ORAL | Status: DC | PRN

## 2017-11-04 MED ORDER — FLUTICASONE FUROATE-VILANTEROL 100-25 MCG/INH IN AEPB: 1.0000 | INHALATION_SPRAY | Freq: Every day | RESPIRATORY_TRACT | Status: DC

## 2017-11-04 MED ORDER — TRAZODONE HCL 50 MG PO TABS: 300.0000 mg | ORAL_TABLET | Freq: Every day | ORAL | Status: DC

## 2017-11-04 MED ORDER — ARIPIPRAZOLE 2 MG PO TABS
2.0000 mg | ORAL_TABLET | ORAL | Status: DC
  Administered 2017-11-04: 2 mg via ORAL
  Filled 2017-11-04: qty 1

## 2017-11-04 MED ORDER — VENLAFAXINE HCL ER 150 MG PO CP24
300.0000 mg | ORAL_CAPSULE | Freq: Every day | ORAL | Status: DC
  Administered 2017-11-04: 300 mg via ORAL
  Filled 2017-11-04: qty 2

## 2017-11-04 NOTE — Telephone Encounter (Signed)
Will forward to MD and social work to advise. [name],CMA

## 2017-11-04 NOTE — Telephone Encounter (Signed)
Copied into 10/31/17 phone note. [name],CMA

## 2017-11-04 NOTE — Telephone Encounter (Signed)
Thank you for giving verbal orders. Patient currently in the ED and CSW is looking at placement options for her.

## 2017-11-04 NOTE — Telephone Encounter (Signed)
[name] called because Kathleen Roy is in the hospital and will be released later today.  He is concerned because she can not walk and even stand and the last time they let her come home she had to call EMS to help get out of the chair which she has been in for a day or two. She used the bathroom on herself and set there in for a couple of days. He know when she gets released today the same thing will happen again. Please help them figure out what they can do. jw

## 2017-11-04 NOTE — Telephone Encounter (Signed)
Areatha Keas      11/04/17 3:42 PM  Note    [name] called because Kathleen Roy is in the hospital and will be released later today.  He is concerned because she can not walk and even stand and the last time they let her come home she had to call EMS to help get out of the chair which she has been in for a day or two. She used the bathroom on herself and set there in for a couple of days. He know when she gets released today the same thing will happen again. Please help them figure out what they can do. jw

## 2017-11-04 NOTE — ED Notes (Signed)
HH breakfast tray ordered for patient, apparently tray never delivered.

## 2017-11-04 NOTE — Clinical Social Work Note (Signed)
Clinical Social Work Assessment  Patient Details  Name: Kathleen Roy MRN: [id number] Date of Birth: May 14, 1961  Date of referral:  11/04/17               Reason for consult:  Facility Placement                Permission sought to share information with:  Case Manager Permission granted to share information::  Yes, Verbal Permission Granted  Name::     Lynnea Ferrier  Agency::     Relationship::     Contact Information:     Housing/Transportation Living arrangements for the past 2 months:  Mobile Home, Skilled Nursing Facility(pt is from [place] of Information:  Patient Patient Interpreter Needed:  None Criminal Activity/Legal Involvement Pertinent to Current Situation/Hospitalization:  No - Comment as needed Significant Relationships:  Siblings Lives with:  Self Do you feel safe going back to the place where you live?  No Need for family participation in patient care:  Yes (Comment)  Care giving concerns:  CSW spoke with pt at bedside. At this time pt is concerns about paying for SNF placement out of pocket.    Social Worker assessment / plan:  CSW spoke with pt at bedside. During this time CSW was informed that pt is from [facility] but was recently discharged from the facility. Pt informed CSW that pt was asked to pay a large amount of money out of pocket and can not afford to do so. CSW was informed by pt that pt needs further assistance but are not sure where to receive these services.   Employment status:  Other (Comment)(unknown) Insurance information:  Managed Medicare PT Recommendations:  Not assessed at this time Information / Referral to community resources:     Patient/Family's Response to care:  At this time pt is understanding and agreeable to plan of care.   Patient/Family's Understanding of and Emotional Response to Diagnosis, Current Treatment, and Prognosis:  No further questions or concerns have been expressed to CSW at this time.   Emotional Assessment Appearance:  Appears stated age, Malodorous Attitude/Demeanor/Rapport:    Affect (typically observed):  Appropriate Orientation:  Oriented to Self, Oriented to Place, Oriented to  Time, Oriented to Situation Alcohol / Substance use:  Not Applicable Psych involvement (Current and /or in the community):  No (Comment)  Discharge Needs  Concerns to be addressed:  Home Safety Concerns, Lack of Support, Financial / Insurance Concerns Readmission within the last 30 days:  No Current discharge risk:  Dependent with Mobility Barriers to Discharge:  No Barriers Identified   Wetzel Bjornstad, [place] 11/04/2017, 10:34 AM

## 2017-11-04 NOTE — Progress Notes (Signed)
[name] received call from ED social worker for coordination or care.  Patient recently discharged home from facility, She is now in the ED unable to care for self and has no support system.  [name] also received in-basket consult from PCP ref. call from patient's brother wanting assistance with placing patient in a facility.   Plan: [name] informed by ED social worker, patient will discharge home with home health services.  [name] left voice message for [facility] Social Worker Kathleen Roy to offer support and coordination of placement for patient.  Kathleen Lanius, [name] Licensed Clinical Social Worker [facility]   [phone] 3:38 PM

## 2017-11-04 NOTE — Progress Notes (Addendum)
CSW spoke with pt at bedside, and confirmed that pt is unable to pay out of pocket for further SNF placement. CSW explained to pt that insurance barriers are often time the issue in regards to paying for services. CSW also explained that pt may have an increases cost at another facility if placed. Per notes, pt has been working with an outpatient [facility]) CSW. ED CSW has not received any further updates on placement for this pt at this time.    CSW spoke with AD and MD and was informed that pt could be discharged back home when medically stable. CSW was informed that pt is set up with [facility] and they are scheduled to met pt today at home. At this time there are no further CSW needs. CSW signing off.      Kathleen Roy, [name], [place] [facility] Clinical Social Worker [phone]

## 2017-11-04 NOTE — Discharge Instructions (Signed)
1.  Continue to work with your doctor and social work for ongoing at home or nursing home care needs.

## 2017-11-04 NOTE — Progress Notes (Signed)
CSW spoke with patient via bedside regarding concerns with patient returning home. Patient stated she didn't feel comfortable returning home because "who will help me use the bathroom". CSW informed patient that she would be receiving [place] services with [facility] (PT, OT, RN, aid, and Education officer, museum). CSW also informed patient of assistance private duty sitters could provide patient however patient would have to search these herself once discharged. Patient stated she understood and would seek private nurses aid's ASAP. CSW also provided Medicaid application for patient. CSW updated RN and EDP. Patient ready for discharge at this time.  Kingsley Spittle, [facility] Clinical Social Worker [phone]

## 2017-11-04 NOTE — Telephone Encounter (Signed)
Pt brother called wondering what to do next about this pt. He mentioned how she has been in the ED since yesterday and they are talking about sending her home today. He would like to speak with someone on how to get her a placed somewhere with full time aid. He is very concerned about her health since she cant do anything for herself and he also cant do anything to help her due to his bad health. Please advise

## 2017-11-04 NOTE — Patient Outreach (Signed)
[place] [facility]) Care Management  11/04/2017  KAMORA VOSSLER 1961-01-10 [id number]   CSW received a call from patient today, very upset and crying, reporting that she is being released from the hospital today and will be returning home to try and live independently.  Patient has been requesting short-term placement in a skilled nursing facility to receive rehabilitative services; however, patient was denied.  CSW spoke with the Emergency Department Social Worker at [facility], also requesting short-term placement for patient, but was told that discharge orders had already been written for patient to return home.  Patient was informed that she would be going home with home health services through [facility].  Home health services will include all of the following: Physical Therapy Occupational Therapy Aide Nursing Patient does not believe that home health services are sufficient enough for her current level of care and is fearful that she will return home and be confined to her recliner again.  Patient is still unable to stand and/or walk, and unable to perform any activities of daily living independently.  CSW is concerned that [facility] will discharge patient from their service, as soon as they realize that patient is unable to bare any weight. CSW is unable to initiate placement for patient at this time, as patient has used all her Medicare days for this fiscal year and her Long-Term Care Medicaid policy lapsed when patient was discharged from [facility]. Patient is aware that she will be responsible for paying out of pocket at a skilled nursing facility, if placement is the route she chooses to go.  Patient knows that she does not have the money to pay at a skilled nursing facility, per day. CSW offered counseling and supportive services to patient and her brother, Ailyne Pawley, as they both appeared quite angry and upset with [facility] for releasing patient back home.  Mr. Woolworth reported, "Well, every few days we'll just call an ambulance to transport her back to the hospital so they can clean her up".  CSW encouraged patient and Mr. Aung to please give the home health services a try, before having patient return to the hospital.  Both voiced understanding and were agreeable to this plan.  CSW will follow-up with patient within the next week to assess and assist with social work needs and services.  In the meantime, CSW will request a completed and signed FL-2 Form from patient's Primary Care Physician, Dr Hyman Bible, just in case placement is an option for patient. [facility] CM Care Plan Problem One     Most Recent Value  Care Plan Problem One  Patient is unable to perform activities of daily living independently.  Role Documenting the Problem One  Clinical Social Worker  Care Plan for Problem One  Active  [facility] Long Term Goal   Patient will receive skilled nursing placement for short-term rehabilitative services, within the next 45 days.  [facility] Long Term Goal Start Date  09/23/17  [facility] Long Term Goal Met Date  09/29/17  Interventions for Problem One Long Term Goal  CSW will assist patient with placement into a skilled nursing facility for rehabiitative services.  [facility] CM Short Term Goal #1   CSW will request a completed and signed FL-2 Form from patient's primary care physician, within the next two weeks.  [facility] CM Short Term Goal #1 Start Date  09/23/17  [facility] CM Short Term Goal #1 Met Date  09/29/17  Interventions for Short Term Goal #1  CSW will pick up the completed and signed FL-2 Form from patient's primary care physician's office.  [facility] CM Short Term Goal #2   CSW will fax patient's completed and signed FL-2 Form to all facilities of choice, within the next two weeks.  [facility] CM Short Term Goal #2 Start Date  09/23/17  [facility] CM Short Term Goal #2 Met Date  09/29/17  Interventions for Short Term Goal #2  CSW will try and obtain bed  offers for patient in skilled nursing settings.    [facility] CM Care Plan Problem Two     Most Recent Value  Care Plan Problem Two  Patient will require home care services in the home, upon discharge from the skilled nursing facility.  Role Documenting the Problem Two  Clinical Social Worker  Care Plan for Problem Two  Active  [facility] CM Short Term Goal #1   Patient will decide on a home health agency of choice, with regards to home care services, within the next 20 days.  [facility] CM Short Term Goal #1 Start Date  10/28/17  [facility] CM Short Term Goal #1 Met Date   11/03/17  Interventions for Short Term Goal #2   CSW will assist patient and social worker at skilled nursing facility with arranging home care services for patient, prior to patient being discharged home.    [facility] CM Care Plan Problem Three     Most Recent Value  Care Plan Problem Three  Level of care issues.  Role Documenting the Problem Three  Clinical Social Worker  Care Plan for Problem Three  Active  [facility] CM Short Term Goal #1   Patient will receive home health services to assist with activities of daily living, within the next 30 days.  [facility] CM Short Term Goal #1 Start Date  11/04/17  Interventions for Short Term Goal #1  Home health services have been arranged for patient and services will begin this week.      [name], [name], [name], [name]  Licensed Education officer, [facility]  [address] [place], [place] [address] Physical Address-[address], [place], [place] [address] Toll Free Main # [phone] Fax # [phone] Cell # [phone]  Office # [phone] [email or web address]

## 2017-11-04 NOTE — ED Notes (Signed)
Clean patient up patient is resting with call bell in reach

## 2017-11-04 NOTE — ED Notes (Signed)
Pt wants to discuss her options with social work again, Social work made aware.

## 2017-11-04 NOTE — Progress Notes (Addendum)
1:40pm: CSW spoke with pt at bedside to address concerns regarding placement. CSW was informed by pt that pt has been at [facility] for almost a month. Pt expressed that she has been in other facilities before and [facility] never stop paying for pt to be at the facility. CSW explained to pt that sometimes insurances will and can stop paying for services if someone is unable to participate in therapies. Pt expressed that she had been participating but wasn't able to walk because of ankle pain. CSW expressed understanding and sought information on pt's interest in [place]. Pt reports that she is unable to receive Medicaid as pt's check is the primary source for upholding pt's home for brother. At this time CSW has presented to all possible options for placement for pt, and pt is unable to agree to one due to other needs.   Pt informed CSW that she is set up with Family Medicine. CSW spoke with Casimer Lanius from [facility] and was informed that Di Kindle is usually the CSW on this case and has been working with pt in the past. [place] reached to [place] to update her on pt's case at this time, no answer left VM asking that [place] call [place] back. CSW will update evening CSW as needed on this case.    CSW received call from [place] Di Kindle) and was informed that she has worked with pt in the past but has not had any activity recently with pt. CSW was informed that pt is unable to ambulate at all  And needing placement  Per RN. CSW spoke with Dr. Loni Muse (Medical Director) and Edwyna Ready and was informed that if  pt is agreeable to sign over needed valuables to qualify for Medicaid, then CSW may be able to assists with placement as needed. CSW went to follow up with pt, but pt was getting care from RN.      Virgie Dad Nila Winker, [name], [place] [facility] Clinical Social Worker [phone]

## 2017-11-05 ENCOUNTER — Emergency Department (HOSPITAL_COMMUNITY)
Admission: EM | Admit: 2017-11-05 | Discharge: 2017-11-06 | Disposition: A | Discharge status: Home / Self Care | Payer: Medicare Other | Attending: Emergency Medicine | Admitting: Emergency Medicine

## 2017-11-05 ENCOUNTER — Other Ambulatory Visit: Payer: Self-pay

## 2017-11-05 ENCOUNTER — Emergency Department (HOSPITAL_COMMUNITY): Payer: Medicare Other

## 2017-11-05 ENCOUNTER — Encounter (HOSPITAL_COMMUNITY): Payer: Self-pay | Admitting: Emergency Medicine

## 2017-11-05 ENCOUNTER — Emergency Department (HOSPITAL_COMMUNITY)
Admission: EM | Admit: 2017-11-05 | Discharge: 2017-11-05 | Disposition: A | Discharge status: Home / Self Care | Payer: Medicare Other | Source: Home / Self Care | Attending: Emergency Medicine | Admitting: Emergency Medicine

## 2017-11-05 ENCOUNTER — Telehealth: Payer: Self-pay | Admitting: Licensed Clinical Social Worker

## 2017-11-05 DIAGNOSIS — I5032 Chronic diastolic (congestive) heart failure: Secondary | ICD-10-CM | POA: Insufficient documentation

## 2017-11-05 DIAGNOSIS — L03115 Cellulitis of right lower limb: Secondary | ICD-10-CM | POA: Insufficient documentation

## 2017-11-05 DIAGNOSIS — E876 Hypokalemia: Secondary | ICD-10-CM

## 2017-11-05 DIAGNOSIS — E669 Obesity, unspecified: Secondary | ICD-10-CM | POA: Insufficient documentation

## 2017-11-05 DIAGNOSIS — L03116 Cellulitis of left lower limb: Secondary | ICD-10-CM | POA: Diagnosis not present

## 2017-11-05 DIAGNOSIS — E119 Type 2 diabetes mellitus without complications: Secondary | ICD-10-CM | POA: Insufficient documentation

## 2017-11-05 DIAGNOSIS — R531 Weakness: Secondary | ICD-10-CM | POA: Insufficient documentation

## 2017-11-05 DIAGNOSIS — R2243 Localized swelling, mass and lump, lower limb, bilateral: Secondary | ICD-10-CM | POA: Diagnosis present

## 2017-11-05 DIAGNOSIS — I509 Heart failure, unspecified: Secondary | ICD-10-CM | POA: Diagnosis not present

## 2017-11-05 DIAGNOSIS — R1084 Generalized abdominal pain: Secondary | ICD-10-CM | POA: Diagnosis not present

## 2017-11-05 DIAGNOSIS — R197 Diarrhea, unspecified: Secondary | ICD-10-CM | POA: Insufficient documentation

## 2017-11-05 DIAGNOSIS — J449 Chronic obstructive pulmonary disease, unspecified: Secondary | ICD-10-CM

## 2017-11-05 DIAGNOSIS — R05 Cough: Secondary | ICD-10-CM | POA: Diagnosis not present

## 2017-11-05 DIAGNOSIS — Z742 Need for assistance at home and no other household member able to render care: Secondary | ICD-10-CM | POA: Diagnosis not present

## 2017-11-05 DIAGNOSIS — I110 Hypertensive heart disease with heart failure: Secondary | ICD-10-CM | POA: Insufficient documentation

## 2017-11-05 DIAGNOSIS — Z79899 Other long term (current) drug therapy: Secondary | ICD-10-CM | POA: Insufficient documentation

## 2017-11-05 DIAGNOSIS — Z87891 Personal history of nicotine dependence: Secondary | ICD-10-CM | POA: Insufficient documentation

## 2017-11-05 DIAGNOSIS — L03119 Cellulitis of unspecified part of limb: Secondary | ICD-10-CM

## 2017-11-05 DIAGNOSIS — R2240 Localized swelling, mass and lump, unspecified lower limb: Secondary | ICD-10-CM | POA: Diagnosis not present

## 2017-11-05 DIAGNOSIS — R031 Nonspecific low blood-pressure reading: Secondary | ICD-10-CM | POA: Diagnosis not present

## 2017-11-05 DIAGNOSIS — Z993 Dependence on wheelchair: Secondary | ICD-10-CM | POA: Diagnosis not present

## 2017-11-05 DIAGNOSIS — R0602 Shortness of breath: Secondary | ICD-10-CM | POA: Diagnosis not present

## 2017-11-05 DIAGNOSIS — Z9981 Dependence on supplemental oxygen: Secondary | ICD-10-CM | POA: Insufficient documentation

## 2017-11-05 DIAGNOSIS — K591 Functional diarrhea: Secondary | ICD-10-CM | POA: Diagnosis not present

## 2017-11-05 LAB — CBC WITH DIFFERENTIAL/PLATELET
BASOS ABS: 0 10*3/uL (ref 0.0–0.1)
BASOS PCT: 0 %
EOS ABS: 0 10*3/uL (ref 0.0–0.7)
Eosinophils Relative: 0 %
HCT: 30.6 % — ABNORMAL LOW (ref 36.0–46.0)
HEMOGLOBIN: 9.4 g/dL — AB (ref 12.0–15.0)
Lymphocytes Relative: 21 %
Lymphs Abs: 1.5 10*3/uL (ref 0.7–4.0)
MCH: 29 pg (ref 26.0–34.0)
MCHC: 30.7 g/dL (ref 30.0–36.0)
MCV: 94.4 fL (ref 78.0–100.0)
Monocytes Absolute: 0.4 10*3/uL (ref 0.1–1.0)
Monocytes Relative: 6 %
NEUTROS PCT: 73 %
Neutro Abs: 5.1 10*3/uL (ref 1.7–7.7)
Platelets: 236 10*3/uL (ref 150–400)
RBC: 3.24 MIL/uL — AB (ref 3.87–5.11)
RDW: 15.2 % (ref 11.5–15.5)
WBC: 7 10*3/uL (ref 4.0–10.5)

## 2017-11-05 LAB — BASIC METABOLIC PANEL
Anion gap: 9 (ref 5–15)
BUN: 14 mg/dL (ref 6–20)
CHLORIDE: 98 mmol/L — AB (ref 101–111)
CO2: 37 mmol/L — ABNORMAL HIGH (ref 22–32)
Calcium: 9.5 mg/dL (ref 8.9–10.3)
Creatinine, Ser: 0.76 mg/dL (ref 0.44–1.00)
GFR calc non Af Amer: >60 mL/min (ref >60)
Glucose, Bld: 127 mg/dL — ABNORMAL HIGH (ref 65–99)
POTASSIUM: 3.1 mmol/L — AB (ref 3.5–5.1)
SODIUM: 144 mmol/L (ref 135–145)

## 2017-11-05 MED ORDER — LOPERAMIDE HCL 2 MG PO CAPS
4.0000 mg | ORAL_CAPSULE | Freq: Once | ORAL | Status: AC
  Administered 2017-11-05: 4 mg via ORAL
  Filled 2017-11-05: qty 2

## 2017-11-05 MED ORDER — POTASSIUM CHLORIDE 20 MEQ/15ML (10%) PO SOLN
40.0000 meq | Freq: Once | ORAL | Status: AC
  Administered 2017-11-05: 40 meq via ORAL
  Filled 2017-11-05: qty 30

## 2017-11-05 NOTE — ED Notes (Signed)
[facility] called for transport waiting arrival for transport home

## 2017-11-05 NOTE — ED Notes (Signed)
Large amount diarrhea stool soaked into pt bedding unable to obtain specimen for C-diff at this time

## 2017-11-05 NOTE — ED Triage Notes (Addendum)
Per [facility], Pt called 911 for redness to back of legs due to incontinence. Pt reports being immobile for at least 1 month. Pt was seen at [facility] x2 yesterday. Pt was seen by social work yesterday. This RN took care of pt yesterday and witnessed social work inform the pt what options were available( medicaid or private duty nurse) pt was sent home with printouts of medicaid application and private care options available in [place]. Pt has no other complaints.

## 2017-11-05 NOTE — Progress Notes (Signed)
[facility] ED CSW covering for [facility] received a call from pt's EDP at [facility] ED stating pt had returned after D/C'ing on 11/20 from [facility] ED and then arriving to and being D/C'd from [facility] ED on 11/21 where pt was D/C'd on 11/21 at approx 6:53am.    Pt returned to the [facility] ED on 11/21 asking for SNF placement, per EDP.  Per notes, pt called an [name] on 11/05/2017 at [facility] stating she was "was tearful, frustrated and confused as to why she is unable to go to SNF for rehab", per [name] at [facility].    CSW review chart and pt was told she has used all of her insurance authorized day and would have to pay out-of-pocket (which pt could not do) or pay out of pocket for an in-home aid and per notes pt was agreeable to that plan at D/C on 11/20.   Pt returned today stating [place] never contacted her and per notes pt has [facility].  It is not known whether pt has reached out a private pay in-home aide.  CSW updated EDP who is aware.  Pt was also provided with a Medicaid application by the [facility] ED CSW at last visit.  Pt was told and pt is aware pt has a [facility] social worker reaching out to her who was made aware by the [name] at [facility].  EDP is aware.    Per notes, pt was offered assistance with placement by the medical director who confirmed with the CSW Asst Director that if pt was willing to "sign over her valuables" and sign over her check, per CSW note on 11/20, but pt stated she could not because of her"cat, her brother depends on her income" and they each have a difficult time being placed together.    CSW updated EDP pt was not willing to do this and was sent home as a result.  EDP stated pt was to be D/C'd.  Please reconsult if future social work needs arise.  CSW signing off, as social work intervention is no longer needed.  Alphonse Guild. Krystelle Prashad, [name], LCAS, CSI Clinical Social Worker Ph: [phone]

## 2017-11-05 NOTE — ED Provider Notes (Signed)
[facility] DEPT Provider Note   CSN: [id number] Arrival date & time: 11/05/17  0307     History   Chief Complaint Chief Complaint  Patient presents with  . Diarrhea    HPI Kathleen Roy is a 56 y.o. female.  HPI Kathleen Roy is a 56 y.o. female with history of CHF, COPD, chronic pain, fibromyalgia, hypertension, peripheral edema, obesity, home oxygen dependence, presents to emergency department complaining of diarrhea.  Patient states that she was recently sent home from a assisted living facility.  States since then has had 2 hospital visits because unable to care for herself at home.  She states she has been sitting in a chair in her own stool and feces.  She is unable to get up and walk.  She was seen by social workers the last 2 visits, including yesterday in the hospital.  She was set up for home  Nursing, aid, PT/PT. she states that did not started.  She states the reason she came back is because diarrhea has gotten worse and now liquid.  She states "it is just coming constantly."  She reports burning and itching in the private area.  She also states that her cough is getting worse.  She states she is having green, thick sputum.  Reports shortness of breath.  No fever or chills.  No abdominal pain.  States she cannot take care of herself at home.  No help at home.  Past Medical History:  Diagnosis Date  . Arthritis    "I feel like it's everywhere" (09/24/2017  . Cellulitis and abscess of lower extremity 08/16/2016   right leg  . CHF (congestive heart failure) ([place])   . Chronic lower back pain   . COPD (chronic obstructive pulmonary disease) ([place])   . Depression   . Fibromyalgia   . Headache    "once q couple months now" (09/24/2017)  . Hyperlipidemia   . Hypertension   . HYPERTENSION, BENIGN SYSTEMIC 02/12/2007  . Memory disturbance   . Migraines 1970's - <2000   "they just went away"  . Neuropathy    "bad in my right hand and in both  feet" (09/24/2017)  . Normal echocardiogram 05/30/05   suboptimal study  . Obesity, morbid (more than 100 lbs over ideal weight or BMI > 40) (HCC)    obese since childhood  . On home oxygen therapy    "4L; 24/7" (09/24/2017)  . Osteoarthritis   . Peripheral edema   . Pressure ulcer of foot    left  . Sepsis ([place])   . Shortness of breath dyspnea   . Sleep apnea 10/2014   "couldn't tolerate the mask" (09/24/2017)  . Type II diabetes mellitus [facility])     Patient Active Problem List   Diagnosis Date Noted  . Candidal dermatitis 09/25/2017  . Requires daily assistance for activities of daily living (ADL) and comfort needs   . Weakness 09/24/2017  . Other fatigue   . Altered mental status 09/20/2017  . 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 Allergic rhinitis 08/15/2017  . Immobility 08/15/2017  . Fibromyalgia 06/16/2017  . Chronic pain of both knees   . Acute respiratory failure with hypercapnia ([place]) 10/15/2016  . Type 2 diabetes mellitus ([place]) 02/14/2016  . Chronic diastolic congestive heart failure ([place])   . Essential hypertension   . OSA (obstructive sleep apnea)   . Obesity hypoventilation syndrome ([place])   . Chronic respiratory failure with hypoxia ([place])   . Hyperglycemia 11/08/2014  .  Memory loss 03/18/2014  . Venous stasis dermatitis of both lower extremities 12/02/2012  . COPD (chronic obstructive pulmonary disease) ([place]) 10/15/2011  . Peripheral neuropathy ([place]) 10/15/2011  . BACK PAIN, LUMBAR, CHRONIC 01/22/2011  . Morbid obesity with BMI of 70 and over, adult ([place]) 11/16/2007  . Major depressive disorder, recurrent episode ([place]) 02/12/2007    Past Surgical History:  Procedure Laterality Date  . ABDOMINAL HERNIA REPAIR  2002  . APPENDECTOMY  1995  . CHOLECYSTECTOMY OPEN  1995  . HERNIA REPAIR    . TOTAL ABDOMINAL HYSTERECTOMY  1994    OB History    Gravida Para Term Preterm AB Living   0 0 0 0 0 0   SAB TAB Ectopic Multiple Live Births   0 0 0 0         Home  Medications    Prior to Admission medications   Medication Sig Start Date End Date Taking? Authorizing Provider  ARIPiprazole (ABILIFY) 2 MG tablet Take 1 tablet (2 mg total) by mouth every other day. 12/18/16   Mayo, Pete Pelt, MD  Ascorbic Acid (VITAMIN C) 1000 MG tablet Take 3,000 mg daily by mouth.     [provider]  atorvastatin (LIPITOR) 10 MG tablet Take 1 tablet (10 mg total) by mouth at bedtime. Patient not taking: Reported on 11/01/2017 03/27/17   Mayo, Pete Pelt, MD  cetirizine (ZYRTEC) 10 MG tablet Take 1 tablet (10 mg total) by mouth daily. 08/15/17   Mayo, Pete Pelt, MD  cyclobenzaprine (FLEXERIL) 10 MG tablet Take 1 tablet (10 mg total) by mouth 3 (three) times daily as needed for muscle spasms. 12/18/16   Mayo, Pete Pelt, MD  diclofenac sodium (VOLTAREN) 1 % GEL Apply 2 g 4 (four) times daily topically. 11/01/17   Langston Masker B, PA-C  fluticasone furoate-vilanterol (BREO ELLIPTA) 100-25 MCG/INH AEPB Inhale 1 puff daily into the lungs.    [provider]  Fluticasone-Salmeterol (ADVAIR) 100-50 MCG/DOSE AEPB Inhale 1 puff into the lungs 2 (two) times daily.    [provider]  naloxone [facility]) nasal spray 4 mg/0.1 mL Place 1 spray into the nose once as needed (opiod overdose).    [provider]  nystatin (NYSTATIN) powder Apply topically See admin instructions. Apply topically to affected area as needed for rash/itching    [provider]  Omega-3 Fatty Acids (FISH OIL) 1000 MG CAPS Take 2 capsules (2,000 mg total) by mouth at bedtime. 06/10/16   Mayo, Pete Pelt, MD  oxyCODONE-acetaminophen (PERCOCET) 10-325 MG tablet Take one tablet by mouth every 6 hours as needed for pain Patient taking differently: Take 1 tablet every 6 (six) hours as needed by mouth for pain.  09/23/17   Mayo, Pete Pelt, MD  OXYGEN Inhale 4 L into the lungs continuous.     [provider]  polyethylene glycol (MIRALAX / GLYCOLAX) packet Take 17 g by mouth  daily. Mix in 8 oz liquid and drink    [provider]  potassium chloride SA (K-DUR,KLOR-CON) 20 MEQ tablet Take 2 tablets once a day. Patient taking differently: Take 40 mEq at bedtime by mouth.  03/04/17   Mayo, Pete Pelt, MD  pregabalin (LYRICA) 150 MG capsule Take 1 capsule (150 mg total) by mouth 2 (two) times daily. 07/28/17   Gerlene Fee, NP  torsemide (DEMADEX) 20 MG tablet Take 40 mg by mouth 2 (two) times daily.    [provider]  trazodone (DESYREL) 300 MG tablet Take 1  tablet (300 mg total) by mouth at bedtime. Patient not taking: Reported on 11/01/2017 10/07/17   MayoPete Pelt, MD  trazodone (DESYREL) 300 MG tablet Take 300 mg at bedtime by mouth.    [provider]  venlafaxine XR (EFFEXOR-XR) 150 MG 24 hr capsule Take 300 mg by mouth daily.     [provider]    Family History Family History  Problem Relation Age of Onset  . Stroke Mother   . Dementia Father   . Prostate cancer Father   . Congestive Heart Failure Brother   . Diabetes Brother   . Hypertension Brother     Social History Social History   Tobacco Use  . Smoking status: Former Smoker    Packs/day: 1.00    Years: 30.00    Pack years: 30.00    Types: Cigarettes    Last attempt to quit: 01/05/2009    Years since quitting: 8.8  . Smokeless tobacco: Never Used  Substance Use Topics  . Alcohol use: No    Alcohol/week: 0.0 oz  . Drug use: No     Allergies   Aspirin   Review of Systems Review of Systems  Constitutional: Positive for fatigue. Negative for chills and fever.  Respiratory: Negative for cough, chest tightness and shortness of breath.   Cardiovascular: Positive for leg swelling. Negative for chest pain and palpitations.  Gastrointestinal: Positive for diarrhea. Negative for abdominal distention, abdominal pain, anal bleeding, nausea and vomiting.  Genitourinary: Negative for dysuria, flank pain and pelvic pain.  Musculoskeletal: Positive for  arthralgias, back pain and myalgias. Negative for neck pain and neck stiffness.  Skin: Negative for rash.  Neurological: Negative for dizziness, weakness and headaches.  All other systems reviewed and are negative.    Physical Exam Updated Vital Signs BP 107/70 (BP Location: Right Arm)   Pulse 92   Temp 98.5 F (36.9 C) (Oral)   Resp 16   Wt (!) 170.1 kg (375 lb)   SpO2 95%   BMI 68.59 kg/m   Physical Exam  Constitutional: She appears well-developed and well-nourished. No distress.  HENT:  Head: Normocephalic.  Eyes: Conjunctivae are normal.  Neck: Neck supple.  Cardiovascular: Normal rate, regular rhythm and normal heart sounds.  Pulmonary/Chest: Effort normal. No respiratory distress. She has no wheezes. She has rales.  Rales at bases  Abdominal: Soft. Bowel sounds are normal. She exhibits no distension. There is no tenderness. There is no rebound.  Musculoskeletal: She exhibits edema.  3+ bilateral pitting edema to LEs.   Neurological: She is alert.  Skin: Skin is warm and dry.  Psychiatric: She has a normal mood and affect. Her behavior is normal.  Nursing note and vitals reviewed.    ED Treatments / Results  Labs (all labs ordered are listed, but only abnormal results are displayed) Labs Reviewed  CBC WITH DIFFERENTIAL/PLATELET - Abnormal; Notable for the following components:      Result Value   RBC 3.24 (*)    Hemoglobin 9.4 (*)    HCT 30.6 (*)    All other components within normal limits  BASIC METABOLIC PANEL - Abnormal; Notable for the following components:   Potassium 3.1 (*)    Chloride 98 (*)    CO2 37 (*)    Glucose, Bld 127 (*)    All other components within normal limits  C DIFFICILE QUICK SCREEN W PCR REFLEX    EKG  EKG Interpretation None       Radiology Dg  Chest 2 View  Result Date: 11/05/2017 CLINICAL DATA:  Cough, shortness of breath and diarrhea EXAM: CHEST  2 VIEW COMPARISON:  Chest radiograph 09/24/2017 FINDINGS: Unchanged  cardiomegaly. Mild vascular congestion without overt pulmonary edema. No pleural effusion or pneumothorax. No focal consolidation. IMPRESSION: Cardiomegaly and pulmonary vascular congestion without overt edema. Electronically Signed   By: Ulyses Jarred M.D.   On: 11/05/2017 04:15    Procedures Procedures (including critical care time)  Medications Ordered in ED Medications - No data to display   Initial Impression / Assessment and Plan / ED Course  I have reviewed the triage vital signs and the nursing notes.  Pertinent labs & imaging results that were available during my care of the patient were reviewed by me and considered in my medical decision making (see chart for details).     Patient in the emergency department from home.  Patient apparently states that her insurance no longer covers her skilled nursing facility.  She has been home for the last several days, and already had 2 trips to the ER, because she is unable to take care of herself.  Patient unable to ambulate due to her leg swelling, leg pain, her oxygen dependence, deconditioning.  She has no family that could help her at home.  She is here today because she has been having increased diarrhea and has been sitting in her own feces and urine.  She did see social workers yesterday who set her up for home health.  Given increased cough and increased diarrhea will get chest x-ray, basic labs, C. Difficile.  6:31 AM Patient has not had any stools in the ED, she has been in the emergency department now for 3-1/2 hours.  She is unable to provide a stool sample to test for C. difficile.  Her labs show elevated CO2, 37, this is a chronic issue for her given her COPD and home oxygen dependence.  Her hemoglobin is 9.4, close to baseline.  Potassium 3.1, most likely from diarrhea, will give her 40 mEq p.o. in emergency department.  Chest x-ray showing vascular congestion with no pulmonary edema.  Her vital signs are normal.  She does not have  a fever.  She is not tachycardic or hypotensive, doubt sepsis.  She is mentating well.  She has no criteria that would allow her admission to the hospital.  I reviewed all the notes from social workers yesterday and several days ago.  I think that they have explored all of her options.  I discussed with Dr. Leonides Schanz, this time given that she just had a consult from social workers yesterday, we do not think she will benefit from another social work consult.  In fact in 1 of the social workers note they did "the patient said that if she is not placed somewhere she will be coming to emergency department every day so that someone can clean her up.  She should have home health set up.  It is unclear when they are going to get to her house.  At this time will be letting her go back home.  Hopefully she will have home health come and see her today.  We discussed that if her symptoms are worsening, if her diarrhea gets worse or if she develops abdominal pain, fever, increased weakness, any new concerning symptoms, she should still come back to emergency department.  Vital signs are normal at time of discharge.  Return precautions discussed  Vitals:   11/05/17 0313 11/05/17 0322  BP:  107/70  Pulse:  92  Resp:  16  Temp:  98.5 F (36.9 C)  TempSrc:  Oral  SpO2:  95%  Weight: (!) 170.1 kg (375 lb)      Final Clinical Impressions(s) / ED Diagnoses   Final diagnoses:  Diarrhea, unspecified type  Weakness  Hypokalemia    ED Discharge Orders    None       Jeannett Senior, PA-C 11/05/17 Gurley, Delice Bison, DO 11/05/17 [phone]

## 2017-11-05 NOTE — Progress Notes (Addendum)
Type of Service: Clinical Social Work Health and safety inspector received phone call from patient, states she just returned home from the hospital this morning due to diarrhea.  She had no one to clean her up so she called 911 to take her to the hospital.  Patient was tearful, frustrated and confused as to why she is unable to go to SNF for rehab.  Reports her brother [name] is unable to care for her and she cannot care for herself. She and brother only have each other both of their income support the household. The following was discussed : What patient would like, barriers to these options, and other barriers (brother's care, 23 yr old cat and limited income) review of SNF payment options, ALF payment options and placement process, discussed placement for patient and brother, concerns about managing their chronic health conditions, applying for LTC Medicaid and Program of [facility]-In[facility] Care for the Elderly ([facility]). Patient appreciative of information and provided verbal consent for [name] to call PACE to start the referral process. Intervention: Emotional Support , Motivational Interviewing, Solution-Focused Strategies, Supportive Counseling and Link to [place] to call patient to share more information about the program. [name] left voice message with [facility] social worker for coordination or care. Assessment: Patient currently experiencing symptoms of stress due to not being able to manage chronic medical conditions without a caretaker.  Patient may benefit from, and is in agreement to receive more information about the PACE program as she explores all options for her and brother [name] to assist with managing their chronic medical conditions.  Plan: 1. Patient will think about all facility options discuss today and talk with brother  2. [name] will F/U with patient and or [facility] Social Worker in 3 to 5 days 3. PACE program will contact patient Monday to start intake/ assessment process   Casimer Lanius,  [name] Licensed Clinical Social Worker [place]   [phone] 11:44 AM

## 2017-11-05 NOTE — ED Notes (Signed)
Contacted Social work

## 2017-11-05 NOTE — Discharge Instructions (Signed)
Take Imodium as needed for diarrhea.  Make sure to drink plenty of fluids.  Eat potassium rich foods, your potassium was slightly low today.  Follow-up with family doctor.  Return if any worsening symptoms.

## 2017-11-05 NOTE — ED Triage Notes (Signed)
Pt having diarrhea stools x1 day has been having congestion cough for the past week

## 2017-11-06 DIAGNOSIS — R42 Dizziness and giddiness: Secondary | ICD-10-CM | POA: Diagnosis not present

## 2017-11-06 DIAGNOSIS — R202 Paresthesia of skin: Secondary | ICD-10-CM | POA: Diagnosis not present

## 2017-11-06 MED ORDER — CEFTRIAXONE SODIUM 250 MG IJ SOLR
250.0000 mg | Freq: Once | INTRAMUSCULAR | Status: AC
  Administered 2017-11-06: 250 mg via INTRAMUSCULAR
  Filled 2017-11-06: qty 250

## 2017-11-06 MED ORDER — LIDOCAINE HCL (PF) 1 % IJ SOLN
INTRAMUSCULAR | Status: AC
  Filled 2017-11-06: qty 5

## 2017-11-06 MED ORDER — CEPHALEXIN 500 MG PO CAPS: 500.0000 mg | ORAL_CAPSULE | Freq: Four times a day (QID) | ORAL | 0 refills | Status: DC

## 2017-11-06 NOTE — ED Provider Notes (Signed)
Patient was discharged from [facility] rehab facility on November 17.  She states she was last ambulatory over a month ago.  She states since she has been home she has not been able to ambulate or pivot herself to get around.  Patient does have a wheelchair.  Patient has been seen in the ED on November 17, November 19, and November 21.  She has been seen by social work several times.  She has refused nursing home placement.  Patient states she is here today because her legs are swollen and the red.  She thinks she may have cellulitis.  She denies fever, chills, nausea, or vomiting.  She states she is supposed to have home health nurse, social worker, physical therapy and OT come to her house, they were scheduled to come today but she states they did not arrive.  She states she is followed at the family practice clinic, she was seen in the office more than a year ago however she states they did a home visit about 6 months ago.  When I looked the patient she is morbidly obese.  She has diffuse swelling of her lower extremities, she does have some redness on the dorsum of her left foot and some redness of her proximal right inner thigh.  Medical screening examination/treatment/procedure(s) were conducted as a shared visit with non-physician practitioner(s) and myself.  I personally evaluated the patient during the encounter.   EKG Interpretation None       Rolland Porter, MD, Barbette Or, MD 11/06/17 [phone]

## 2017-11-06 NOTE — Discharge Instructions (Addendum)
Your chest x-ray shows no signs of worsening fluid on your lungs.  Have been given you antibiotics for infection of your legs.  Please take as prescribed and complete the entire course.  This medication may give you an upset stomach.  May take over-the-counter probiotics to help with this.  It is very important that you follow-up with your primary care doctor.  It is also very important that she call back to the home health agency to have them come out to set up an appointment with you.  Return to the ED if you develop any worsening redness, worsening pain, worsening fevers.

## 2017-11-06 NOTE — ED Notes (Signed)
[facility] called for transport.  

## 2017-11-06 NOTE — ED Provider Notes (Signed)
[facility] EMERGENCY DEPARTMENT Provider Note   CSN: [id number] Arrival date & time: 11/05/17  2211     History   Chief Complaint Chief Complaint  Patient presents with  . Skin Ulcer    HPI DEBE Kathleen Roy is a 56 y.o. female.  HPI 56 year old Caucasian female past medical history significant for CHF, COPD, chronic pain, fibromyalgia, hypertension, peripheral edema, morbid obesity, home oxygen dependence that presents to the emergency department today for social issues.  Patient has been seen several times in the past week for same.  Patient states that that she was discharged from [facility] rehab 11/17 after being cleared by pt and insurance running out.  Patient states that since going home she has been weak and unable to ambulate. She does have a wheelchair at home.  States that she has been seen several times for the same complaint in the ED.  She was seen yesterday for diarrhea and lying" her own feces and urine".  States that she is not taking her Lasix because she cannot get to the bathroom to urinate and she just lies on it.  States that she is developed some redness to her lower extremities is a concern for cellulitis.  Patient wants to be placed in a rehab facility or admitted to the hospital.  I have reviewed patient's prior medical records and notes from social worker, case Freight forwarder, ED providers.  Patient does not meet any inpatient criteria.  She has to pay out of pocket for skilled nursing facility which patient does not want to do.She states she has been sitting in a chair in her own stool and feces.  She is unable to get up and walk.  She was seen by social workers the last 2 visits, including yesterday in the hospital.  She was set up for home  Nursing, aid, PT/PT. she states that did not started.  Patient states that she was opposed to have home health come out today which nobody came.  However on review of patient's prior chart she has had this multiple times.   It has been noted by Education officer, museum and case manager that they are working on arranging home health for patient.  She spoke with a Education officer, museum today who states that [facility] social worker will follow up with her in 3-5 days.  [facility] will contact patient on Monday to start intake/assessment process.  Patient is concerned that fluid is going to develop in along with her not taking her Lasix and that she may need IV antibiotics for her cellulitis.  Pt denies any fever, chill, ha, vision changes, lightheadedness, dizziness, congestion, neck pain, cp, sob, cough, abd pain, n/v/d, urinary symptoms,  melena, hematochezia, lower extremity paresthesias.      Past Medical History:  Diagnosis Date  . Arthritis    "I feel like it's everywhere" (09/24/2017  . Cellulitis and abscess of lower extremity 08/16/2016   right leg  . CHF (congestive heart failure) ([place])   . Chronic lower back pain   . COPD (chronic obstructive pulmonary disease) ([place])   . Depression   . Fibromyalgia   . Headache    "once q couple months now" (09/24/2017)  . Hyperlipidemia   . Hypertension   . HYPERTENSION, BENIGN SYSTEMIC 02/12/2007  . Memory disturbance   . Migraines 1970's - <2000   "they just went away"  . Neuropathy    "bad in my right hand and in both feet" (09/24/2017)  . Normal echocardiogram 05/30/05  suboptimal study  . Obesity, morbid (more than 100 lbs over ideal weight or BMI > 40) (HCC)    obese since childhood  . On home oxygen therapy    "4L; 24/7" (09/24/2017)  . Osteoarthritis   . Peripheral edema   . Pressure ulcer of foot    left  . Sepsis ([place])   . Shortness of breath dyspnea   . Sleep apnea 10/2014   "couldn't tolerate the mask" (09/24/2017)  . Type II diabetes mellitus [facility])     Patient Active Problem List   Diagnosis Date Noted  . Candidal dermatitis 09/25/2017  . Requires daily assistance for activities of daily living (ADL) and comfort needs   . Weakness 09/24/2017  . Other  fatigue   . Altered mental status 09/20/2017  . 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 Allergic rhinitis 08/15/2017  . Immobility 08/15/2017  . Fibromyalgia 06/16/2017  . Chronic pain of both knees   . Acute respiratory failure with hypercapnia ([place]) 10/15/2016  . Type 2 diabetes mellitus ([place]) 02/14/2016  . Chronic diastolic congestive heart failure ([place])   . Essential hypertension   . OSA (obstructive sleep apnea)   . Obesity hypoventilation syndrome ([place])   . Chronic respiratory failure with hypoxia ([place])   . Hyperglycemia 11/08/2014  . Memory loss 03/18/2014  . Venous stasis dermatitis of both lower extremities 12/02/2012  . COPD (chronic obstructive pulmonary disease) ([place]) 10/15/2011  . Peripheral neuropathy ([place]) 10/15/2011  . BACK PAIN, LUMBAR, CHRONIC 01/22/2011  . Morbid obesity with BMI of 70 and over, adult ([place]) 11/16/2007  . Major depressive disorder, recurrent episode ([place]) 02/12/2007    Past Surgical History:  Procedure Laterality Date  . ABDOMINAL HERNIA REPAIR  2002  . APPENDECTOMY  1995  . CHOLECYSTECTOMY OPEN  1995  . HERNIA REPAIR    . TOTAL ABDOMINAL HYSTERECTOMY  1994    OB History    Gravida Para Term Preterm AB Living   0 0 0 0 0 0   SAB TAB Ectopic Multiple Live Births   0 0 0 0         Home Medications    Prior to Admission medications   Medication Sig Start Date End Date Taking? Authorizing Provider  ARIPiprazole (ABILIFY) 2 MG tablet Take 1 tablet (2 mg total) by mouth every other day. 12/18/16   Mayo, Pete Pelt, MD  Ascorbic Acid (VITAMIN C) 1000 MG tablet Take 3,000 mg daily by mouth.     [provider]  atorvastatin (LIPITOR) 10 MG tablet Take 1 tablet (10 mg total) by mouth at bedtime. Patient not taking: Reported on 11/01/2017 03/27/17   Mayo, Pete Pelt, MD  cetirizine (ZYRTEC) 10 MG tablet Take 1 tablet (10 mg total) by mouth daily. 08/15/17   Mayo, Pete Pelt, MD  cyclobenzaprine (FLEXERIL) 10 MG tablet Take 1 tablet (10 mg total) by mouth  3 (three) times daily as needed for muscle spasms. Patient not taking: Reported on 11/05/2017 12/18/16   Mayo, Pete Pelt, MD  diclofenac sodium (VOLTAREN) 1 % GEL Apply 2 g 4 (four) times daily topically. Patient not taking: Reported on 11/05/2017 11/01/17   Langston Masker B, PA-C  fluticasone furoate-vilanterol (BREO ELLIPTA) 100-25 MCG/INH AEPB Inhale 1 puff daily into the lungs.    [provider]  Fluticasone-Salmeterol (ADVAIR) 100-50 MCG/DOSE AEPB Inhale 1 puff into the lungs 2 (two) times daily.    [provider]  nystatin (NYSTATIN) powder Apply 1 g topically daily as needed. Apply topically to  affected area as needed for rash/itching     [provider]  Omega-3 Fatty Acids (FISH OIL) 1000 MG CAPS Take 2 capsules (2,000 mg total) by mouth at bedtime. 06/10/16   Mayo, Pete Pelt, MD  oxyCODONE-acetaminophen (PERCOCET) 10-325 MG tablet Take one tablet by mouth every 6 hours as needed for pain Patient taking differently: Take 1 tablet every 6 (six) hours as needed by mouth for pain.  09/23/17   Mayo, Pete Pelt, MD  OXYGEN Inhale 4 L into the lungs continuous.     [provider]  polyethylene glycol (MIRALAX / GLYCOLAX) packet Take 17 g by mouth daily. Mix in 8 oz liquid and drink    [provider]  potassium chloride SA (K-DUR,KLOR-CON) 20 MEQ tablet Take 2 tablets once a day. Patient taking differently: Take 40 mEq at bedtime by mouth.  03/04/17   Mayo, Pete Pelt, MD  pregabalin (LYRICA) 150 MG capsule Take 1 capsule (150 mg total) by mouth 2 (two) times daily. 07/28/17   Gerlene Fee, NP  torsemide (DEMADEX) 20 MG tablet Take 40 mg by mouth 2 (two) times daily.    [provider]  trazodone (DESYREL) 300 MG tablet Take 1 tablet (300 mg total) by mouth at bedtime. 10/07/17   Mayo, Pete Pelt, MD  venlafaxine XR (EFFEXOR-XR) 150 MG 24 hr capsule Take 300 mg by mouth daily.     [provider]    Family History Family History    Problem Relation Age of Onset  . Stroke Mother   . Dementia Father   . Prostate cancer Father   . Congestive Heart Failure Brother   . Diabetes Brother   . Hypertension Brother     Social History Social History   Tobacco Use  . Smoking status: Former Smoker    Packs/day: 1.00    Years: 30.00    Pack years: 30.00    Types: Cigarettes    Last attempt to quit: 01/05/2009    Years since quitting: 8.8  . Smokeless tobacco: Never Used  Substance Use Topics  . Alcohol use: No    Alcohol/week: 0.0 oz  . Drug use: No     Allergies   Aspirin   Review of Systems Review of Systems  Constitutional: Negative for chills and fever.  HENT: Negative for congestion.   Respiratory: Negative for shortness of breath.   Cardiovascular: Positive for leg swelling. Negative for chest pain.  Gastrointestinal: Negative for abdominal pain, nausea and vomiting.  Genitourinary: Negative for dysuria, frequency, hematuria and urgency.  Musculoskeletal: Negative for myalgias.  Skin: Positive for color change and rash.  Neurological: Negative for weakness and numbness.     Physical Exam Updated Vital Signs BP 123/75 (BP Location: Right Arm)   Pulse 90   Temp 98.2 F (36.8 C) (Oral)   Resp 16   SpO2 95%   Physical Exam  Constitutional: She is oriented to person, place, and time. She appears well-developed and well-nourished.  Non-toxic appearance. No distress.  Morbid obesity, patient very disheveled.  On 4 L of oxygen at baseline  HENT:  Head: Normocephalic and atraumatic.  Mouth/Throat: Oropharynx is clear and moist.  Eyes: Conjunctivae are normal. Pupils are equal, round, and reactive to light. Right eye exhibits no discharge. Left eye exhibits no discharge.  Neck: Normal range of motion. Neck supple.  Cardiovascular: Normal rate, regular rhythm, normal heart sounds and intact distal pulses. Exam reveals no gallop and no friction rub.  No murmur  heard. Pulmonary/Chest: Effort normal  and breath sounds normal. No stridor. No respiratory distress. She has no wheezes. She has no rales. She exhibits no tenderness.  Do not appreciate any rales or crackles on exam.  No increased work of breathing.  Abdominal: Soft. Bowel sounds are normal. There is no tenderness. There is no rebound and no guarding.  Musculoskeletal: Normal range of motion. She exhibits no tenderness.  3+ bilateral pitting edema to LEs.    Patient does have chronic venous stasis noted of her lower extremities.  Brisk cap refill.  DP pulses 2+ bilaterally.  She does have some erythema over the distal portions of her legs without any area of fluctuance or warmth.  No drainage.  No calf tenderness.  Lymphadenopathy:    She has no cervical adenopathy.  Neurological: She is alert and oriented to person, place, and time.  Skin: Skin is warm and dry. Capillary refill takes less than 2 seconds.  Psychiatric: Her behavior is normal. Judgment and thought content normal.  Nursing note and vitals reviewed.    ED Treatments / Results  Labs (all labs ordered are listed, but only abnormal results are displayed) Labs Reviewed - No data to display  EKG  EKG Interpretation None       Radiology Dg Chest 2 View  Result Date: 11/05/2017 CLINICAL DATA:  CHF. EXAM: CHEST  2 VIEW COMPARISON:  11/05/2017 FINDINGS: Stable cardiomegaly. Interval decrease in pulmonary vascular congestion since recent comparison. No pneumonic consolidation. No acute osseous abnormality. Mediastinal contours are stable. IMPRESSION: Stable cardiomegaly. Interval decrease in pulmonary vascular congestion. Electronically Signed   By: Ashley Royalty M.D.   On: 11/05/2017 23:51   Dg Chest 2 View  Result Date: 11/05/2017 CLINICAL DATA:  Cough, shortness of breath and diarrhea EXAM: CHEST  2 VIEW COMPARISON:  Chest radiograph 09/24/2017 FINDINGS: Unchanged cardiomegaly. Mild vascular congestion without overt pulmonary edema. No pleural effusion or  pneumothorax. No focal consolidation. IMPRESSION: Cardiomegaly and pulmonary vascular congestion without overt edema. Electronically Signed   By: Ulyses Jarred M.D.   On: 11/05/2017 04:15    Procedures Procedures (including critical care time)  Medications Ordered in ED Medications - No data to display   Initial Impression / Assessment and Plan / ED Course  I have reviewed the triage vital signs and the nursing notes.  Pertinent labs & imaging results that were available during my care of the patient were reviewed by me and considered in my medical decision making (see chart for details).      Patient in the emergency department from home.  Patient apparently states that her insurance no longer covers her skilled nursing facility.  She has been home for the last several days, and already had 3 trips to the ER, because she is unable to take care of herself.  Patient unable to ambulate due to her leg swelling, leg pain, her oxygen dependence, deconditioning.  She has no family that could help her at home.  Patient presents today with redness to her lower extremities that she developed from her incontinence and is concerned for cellulitis and may need IV antibiotics.  On exam patient does have some mild erythema to the lower extremities.  On exam patient is nontoxic appearing.  Vital signs are reassuring.  Patient on 4 L of oxygen at baseline.  Patient had lab work yesterday that was very reassuring and have reviewed patient's lab work.  Chest x-ray yesterday showed vascular congestion no pulmonary edema.  I repeated patient's  chest x-ray today as she was worried about developing fluid on her lungs which shows marketed decrease and vascular congestion without any pulmonary edema.  Patient has no new oxygen requirement today.  Lungs clear to auscultation bilaterally.  She does have some lower extremity redness could be concerning for developing cellulitis.  Will start patient on antibiotics.  Do  not feel that patient needs IV antibiotics at this time.  Reassuring lab work yesterday without any leukocytosis.  Patient is afebrile today.  Patient is very tearful in the room.  States that she wants to be admitted to the hospital or needs to be admitted to a rehab facility.  Patient has no criteria that would allow her admission to the hospital.  I have reviewed all the patient's notes from prior visits and social workers and case manager notes.  I even spoke with social worker today concerning patient.  He informed me that they offer her placement in rehab if she would relinquish her income to be started on Medicaid.  Patient refused.  One Education officer, museum does note that "patient states that she is not placed somewhere she will be coming to the emergency department every day for that somebody can clean her up".  Patient has been offered home health several times.  Patient continued to say that nobody from home health has come out to her house.  Reviewed the social worker note today says that some ongoing contact with her in 3-5 days.  Again patient has no criteria for admission at this time.  Her overall exam is very reassuring.  Her legs will be rewrapped.  Started on antibiotics and follow-up in the outpatient setting with her primary care doctor.  Pt is hemodynamically stable, in NAD, & able to ambulate in the ED. Evaluation does not show pathology that would require ongoing emergent intervention or inpatient treatment. I explained the diagnosis to the patient. Pain has been managed & has no complaints prior to dc. Pt is comfortable with above plan and is stable for discharge at this time. All questions were answered prior to disposition. Strict return precautions for f/u to the ED were discussed including developed of fevers. Encouraged follow up with PCP.  Pt was seen and dicussed with Dr. Tomi Bamberger who is agreeable with the above plan.    Final Clinical Impressions(s) / ED Diagnoses   Final  diagnoses:  Cellulitis of lower extremity, unspecified laterality    ED Discharge Orders        Ordered    cephALEXin (KEFLEX) 500 MG capsule  4 times daily     11/06/17 0046       Doristine Devoid, PA-C 11/06/17 0048    Doristine Devoid, PA-C 11/06/17 0051    Rolland Porter, MD 11/06/17 [phone]

## 2017-11-07 ENCOUNTER — Encounter (HOSPITAL_COMMUNITY): Payer: Self-pay

## 2017-11-07 ENCOUNTER — Emergency Department (HOSPITAL_COMMUNITY)
Admission: EM | Admit: 2017-11-07 | Discharge: 2017-11-07 | Disposition: A | Discharge status: Home / Self Care | Payer: Medicare Other | Attending: Emergency Medicine | Admitting: Emergency Medicine

## 2017-11-07 DIAGNOSIS — M79609 Pain in unspecified limb: Secondary | ICD-10-CM | POA: Diagnosis not present

## 2017-11-07 DIAGNOSIS — R21 Rash and other nonspecific skin eruption: Secondary | ICD-10-CM | POA: Diagnosis not present

## 2017-11-07 DIAGNOSIS — I5032 Chronic diastolic (congestive) heart failure: Secondary | ICD-10-CM | POA: Insufficient documentation

## 2017-11-07 DIAGNOSIS — R5383 Other fatigue: Secondary | ICD-10-CM | POA: Diagnosis not present

## 2017-11-07 DIAGNOSIS — J449 Chronic obstructive pulmonary disease, unspecified: Secondary | ICD-10-CM | POA: Diagnosis not present

## 2017-11-07 DIAGNOSIS — R609 Edema, unspecified: Secondary | ICD-10-CM | POA: Diagnosis not present

## 2017-11-07 DIAGNOSIS — Z79899 Other long term (current) drug therapy: Secondary | ICD-10-CM | POA: Insufficient documentation

## 2017-11-07 DIAGNOSIS — Z87891 Personal history of nicotine dependence: Secondary | ICD-10-CM | POA: Insufficient documentation

## 2017-11-07 DIAGNOSIS — Z7984 Long term (current) use of oral hypoglycemic drugs: Secondary | ICD-10-CM | POA: Diagnosis not present

## 2017-11-07 DIAGNOSIS — R531 Weakness: Secondary | ICD-10-CM | POA: Diagnosis not present

## 2017-11-07 DIAGNOSIS — E1140 Type 2 diabetes mellitus with diabetic neuropathy, unspecified: Secondary | ICD-10-CM | POA: Insufficient documentation

## 2017-11-07 DIAGNOSIS — R52 Pain, unspecified: Secondary | ICD-10-CM | POA: Diagnosis not present

## 2017-11-07 DIAGNOSIS — I110 Hypertensive heart disease with heart failure: Secondary | ICD-10-CM | POA: Insufficient documentation

## 2017-11-07 DIAGNOSIS — L89319 Pressure ulcer of right buttock, unspecified stage: Secondary | ICD-10-CM | POA: Diagnosis present

## 2017-11-07 DIAGNOSIS — L89329 Pressure ulcer of left buttock, unspecified stage: Secondary | ICD-10-CM | POA: Diagnosis not present

## 2017-11-07 DIAGNOSIS — M79606 Pain in leg, unspecified: Secondary | ICD-10-CM | POA: Diagnosis not present

## 2017-11-07 DIAGNOSIS — L538 Other specified erythematous conditions: Secondary | ICD-10-CM | POA: Diagnosis not present

## 2017-11-07 NOTE — Progress Notes (Addendum)
Consult request has been received. [facility] ED CSW covering [facility] ED attempting to follow up at present time.  Pt is well-known to this CSW who was consulted regarding this pt on the evening of 11/21 when the pt had returned to the [facility] ED after being D/C 'd from the [facility] ED on 11/21 at approx 6:53am.  Previous to the [facility] ED visit pt was D/C'd from the [facility] ED on 11/20pm in the early evening after being admitted into the ED in early evening of 11/19.  Per notes and CM pt had [facility] in [facility] but pt continues to arrive to the ED needing to be "cleaned up" due to her stating she is unable to care for her self and due to her being incontinent.  Per previous notes on chart pt was offered long-term placement because:  1.  She has "used up all her authorized insurance days for short-term rehab" from her stay at [facility] and will not have more days authorized for 30 days and.. 2.  She will probably be seen as 'custodial' anyway by insurance until she can perform PT/OT after 30 days.  Medical Director has offered long-term placement assistance thru social work on previous visits but this is not possible because:  1. Pt has refused to agree tosign over her social security check which is required for long-term residential stays due to her "brother and cat" (per notes) needing the check to live in their home together   Market researcher and CSW Asst Director have both stated that since pt has refused to sign over check pt must return home.  7:06 PM [place] staffed with Asst CSW Director again who said as a result of pt refusing to sign over check pt must return home.  CSW will speak to [facility] EDP to determine pt's medical needs.   7:14 PM CSW spoke with provider pt will be sent home once medically cleared.  CSW will continue to follow.  Alphonse Guild. Evey Mcmahan, [name], LCAS, CSI Clinical Social Worker Ph: [phone]

## 2017-11-07 NOTE — Progress Notes (Addendum)
CSW spoke to pt who stated pt was visited by a [facility] worker today and pt states she was told by [facility] worker that they could not provide [facility] services to the pt because the pt "could not get up and move to get to the bathroom" and also because she "can't perform PT", per the pt.  CSW will update CM.  CSW inquired if pt can afford a private pay in-home aide and pt stated she had not called to ask but states she probably can't afford it.  CSW asked pt if it was true that pt had been asked by a CSW if she was willing to sign over her check so she can be placed into a SNF and she stated this was true and that she had refused because she wanted both her and her brother to be placed together.  CSW states [facility] ED can't place her brother and that if she agreed to sign over her check the CSW could only place the pt. Pt voiced concerns over her and her brother and her cat and stated she wanted SNF placement in the same facility that her brother would be getting ALF placement in if he were to be placed also. Per pt, pt is currently living in a home with her brother.  CSW stated that placing both the pt and her brother into the same facility is difficult, if not impossible, because few facilities have both SNF and ALF but that CSW is willing to fax over to [facility] ED both SNF and ALF lists to pt's RN to be provided to the pt so upon D/C pt and pt's brother can call facilities to see if this is possible.  CSW the counseled pt she could go to her and her brother's PCP and request placement assistance to the facility of their choice.  Pt stated she had been offered this assistance before by the CSW at her PCP and would take the PCP "up on their offer" after D/C and after reviewing and calling SNF/ALF's on the SNF/ALF''s lists provided to the pt by the CSW/RN on duty at [facility] ED. Pt is in agreement with this plan.  CSW will contact pt's RN.  8:25 PM CSW spoke to pt's RN and stated CSW will fax SNF/ALF''s lists to [phone]  [phone] attn: Jenny Reichmann and updated RN pt is agreeable to D/C.  CSW will fax now.    8:40 PM Fax sent.  9:24 PM CSW updated EDP.  CSW will continue to follow.  Alphonse Guild. Evelyna Folker, [name], LCAS, CSI Clinical Social Worker Ph: [phone]

## 2017-11-07 NOTE — Discharge Instructions (Signed)
Try to turn from side to side as much as possible. Use barrier cream. Try to keep legs elevated. Follow up with family doctor.

## 2017-11-07 NOTE — ED Provider Notes (Signed)
[place] EMERGENCY DEPARTMENT Provider Note   CSN: [id number] Arrival date & time: 11/07/17  1757     History   Chief Complaint Chief Complaint  Patient presents with  . Pressure Ulcer    HPI Kathleen WAHLBERG is a 56 y.o. female.  HPI Kathleen Roy is a 56 y.o. female presents to emergency department with complaint of skin breakdown to bilateral legs.  Patient recently discharged from rehab facility on November 17.  She was discharged due to insurance no longer covering for her stay.  Patient is unable to pay for them out of pocket for her facility stay.  Patient was discharged home, social work was consulted, home health set up.  Patient with now 6 visits in the last week since her discharge for various complaints, mainly related to not being able to take care of herself at home.  Patient states she sits at home in a recliner, she is unable to turn or get up or walk.  She states her brother gives her food.  She has been coming to the ED to help [place] clean her up.  Social worker has explored all the options with her as far as placement.  Patient would need to give up her check that she is receiving for placement, however there is a social issue with living situation with her brother who is currently staying with her.  She states that her brother is unable to help her at home because he is ill himself.  She states that she is here today because she has been sitting in her urine and her legs are burning.  There is no fever or chills.  No nausea or vomiting.  No other complaints.  Past Medical History:  Diagnosis Date  . Arthritis    "I feel like it's everywhere" (09/24/2017  . Cellulitis and abscess of lower extremity 08/16/2016   right leg  . CHF (congestive heart failure) ([place])   . Chronic lower back pain   . COPD (chronic obstructive pulmonary disease) ([place])   . Depression   . Fibromyalgia   . Headache    "once q couple months now" (09/24/2017)  . Hyperlipidemia     . Hypertension   . HYPERTENSION, BENIGN SYSTEMIC 02/12/2007  . Memory disturbance   . Migraines 1970's - <2000   "they just went away"  . Neuropathy    "bad in my right hand and in both feet" (09/24/2017)  . Normal echocardiogram 05/30/05   suboptimal study  . Obesity, morbid (more than 100 lbs over ideal weight or BMI > 40) (HCC)    obese since childhood  . On home oxygen therapy    "4L; 24/7" (09/24/2017)  . Osteoarthritis   . Peripheral edema   . Pressure ulcer of foot    left  . Sepsis ([place])   . Shortness of breath dyspnea   . Sleep apnea 10/2014   "couldn't tolerate the mask" (09/24/2017)  . Type II diabetes mellitus [facility])     Patient Active Problem List   Diagnosis Date Noted  . Candidal dermatitis 09/25/2017  . Requires daily assistance for activities of daily living (ADL) and comfort needs   . Weakness 09/24/2017  . Other fatigue   . Altered mental status 09/20/2017  . 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 Allergic rhinitis 08/15/2017  . Immobility 08/15/2017  . Fibromyalgia 06/16/2017  . Chronic pain of both knees   . Acute respiratory failure with hypercapnia ([place]) 10/15/2016  . Type 2  diabetes mellitus ([place]) 02/14/2016  . Chronic diastolic congestive heart failure ([place])   . Essential hypertension   . OSA (obstructive sleep apnea)   . Obesity hypoventilation syndrome ([place])   . Chronic respiratory failure with hypoxia ([place])   . Hyperglycemia 11/08/2014  . Memory loss 03/18/2014  . Venous stasis dermatitis of both lower extremities 12/02/2012  . COPD (chronic obstructive pulmonary disease) ([place]) 10/15/2011  . Peripheral neuropathy ([place]) 10/15/2011  . BACK PAIN, LUMBAR, CHRONIC 01/22/2011  . Morbid obesity with BMI of 70 and over, adult ([place]) 11/16/2007  . Major depressive disorder, recurrent episode ([place]) 02/12/2007    Past Surgical History:  Procedure Laterality Date  . ABDOMINAL HERNIA REPAIR  2002  . APPENDECTOMY  1995  . CHOLECYSTECTOMY OPEN  1995  . HERNIA  REPAIR    . TOTAL ABDOMINAL HYSTERECTOMY  1994    OB History    Gravida Para Term Preterm AB Living   0 0 0 0 0 0   SAB TAB Ectopic Multiple Live Births   0 0 0 0         Home Medications    Prior to Admission medications   Medication Sig Start Date End Date Taking? Authorizing Provider  ARIPiprazole (ABILIFY) 2 MG tablet Take 1 tablet (2 mg total) by mouth every other day. 12/18/16   Mayo, Pete Pelt, MD  Ascorbic Acid (VITAMIN C) 1000 MG tablet Take 3,000 mg daily by mouth.     [provider]  atorvastatin (LIPITOR) 10 MG tablet Take 1 tablet (10 mg total) by mouth at bedtime. Patient not taking: Reported on 11/01/2017 03/27/17   Mayo, Pete Pelt, MD  cephALEXin (KEFLEX) 500 MG capsule Take 1 capsule (500 mg total) by mouth 4 (four) times daily. 11/06/17   Doristine Devoid, PA-C  cetirizine (ZYRTEC) 10 MG tablet Take 1 tablet (10 mg total) by mouth daily. 08/15/17   Mayo, Pete Pelt, MD  cyclobenzaprine (FLEXERIL) 10 MG tablet Take 1 tablet (10 mg total) by mouth 3 (three) times daily as needed for muscle spasms. Patient not taking: Reported on 11/05/2017 12/18/16   Mayo, Pete Pelt, MD  diclofenac sodium (VOLTAREN) 1 % GEL Apply 2 g 4 (four) times daily topically. Patient not taking: Reported on 11/05/2017 11/01/17   Langston Masker B, PA-C  fluticasone furoate-vilanterol (BREO ELLIPTA) 100-25 MCG/INH AEPB Inhale 1 puff daily into the lungs.    [provider]  Fluticasone-Salmeterol (ADVAIR) 100-50 MCG/DOSE AEPB Inhale 1 puff into the lungs 2 (two) times daily.    [provider]  nystatin (NYSTATIN) powder Apply 1 g topically daily as needed. Apply topically to affected area as needed for rash/itching     [provider]  Omega-3 Fatty Acids (FISH OIL) 1000 MG CAPS Take 2 capsules (2,000 mg total) by mouth at bedtime. 06/10/16   Mayo, Pete Pelt, MD  oxyCODONE-acetaminophen (PERCOCET) 10-325 MG tablet Take one tablet by mouth every 6 hours as needed for  pain Patient taking differently: Take 1 tablet every 6 (six) hours as needed by mouth for pain.  09/23/17   Mayo, Pete Pelt, MD  OXYGEN Inhale 4 L into the lungs continuous.     [provider]  polyethylene glycol (MIRALAX / GLYCOLAX) packet Take 17 g by mouth daily. Mix in 8 oz liquid and drink    [provider]  potassium chloride SA (K-DUR,KLOR-CON) 20 MEQ tablet Take 2 tablets once a day. Patient taking differently: Take 40 mEq at bedtime by mouth.  03/04/17   Mayo, Pete Pelt, MD  pregabalin (LYRICA) 150 MG capsule Take 1 capsule (150 mg total) by mouth 2 (two) times daily. 07/28/17   Gerlene Fee, NP  torsemide (DEMADEX) 20 MG tablet Take 40 mg by mouth 2 (two) times daily.    [provider]  trazodone (DESYREL) 300 MG tablet Take 1 tablet (300 mg total) by mouth at bedtime. 10/07/17   Mayo, Pete Pelt, MD  venlafaxine XR (EFFEXOR-XR) 150 MG 24 hr capsule Take 300 mg by mouth daily.     [provider]    Family History Family History  Problem Relation Age of Onset  . Stroke Mother   . Dementia Father   . Prostate cancer Father   . Congestive Heart Failure Brother   . Diabetes Brother   . Hypertension Brother     Social History Social History   Tobacco Use  . Smoking status: Former Smoker    Packs/day: 1.00    Years: 30.00    Pack years: 30.00    Types: Cigarettes    Last attempt to quit: 01/05/2009    Years since quitting: 8.8  . Smokeless tobacco: Never Used  Substance Use Topics  . Alcohol use: No    Alcohol/week: 0.0 oz  . Drug use: No     Allergies   Aspirin   Review of Systems Review of Systems  Constitutional: Positive for fatigue. Negative for chills and fever.  Respiratory: Negative for cough, chest tightness and shortness of breath.   Cardiovascular: Negative for chest pain, palpitations and leg swelling.  Gastrointestinal: Negative for abdominal pain, diarrhea, nausea and vomiting.  Genitourinary: Negative for  dysuria, flank pain, pelvic pain, vaginal bleeding, vaginal discharge and vaginal pain.  Musculoskeletal: Positive for myalgias. Negative for arthralgias, neck pain and neck stiffness.  Skin: Positive for color change and rash.  Neurological: Positive for weakness. Negative for dizziness and headaches.  All other systems reviewed and are negative.    Physical Exam Updated Vital Signs BP 118/71   Pulse 81   Temp 98.2 F (36.8 C) (Oral)   Resp (!) 22   Wt (!) 170.1 kg (375 lb)   SpO2 100%   BMI 68.59 kg/m   Physical Exam  Constitutional: She appears well-developed and well-nourished. No distress.  HENT:  Head: Normocephalic.  Eyes: Conjunctivae are normal.  Neck: Neck supple.  Cardiovascular: Normal rate, regular rhythm and normal heart sounds.  Pulmonary/Chest: Effort normal and breath sounds normal. No respiratory distress. She has no wheezes. She has no rales.  Abdominal: Soft. Bowel sounds are normal. She exhibits no distension. There is no tenderness. There is no rebound.  Musculoskeletal: She exhibits no edema.  3+ bilateral lower extremity edema.  Neurological: She is alert.  Skin: Skin is warm and dry.  Erythema to bilateral buttock and sacrum.  No skin breakdown.  Erythema to the right posterior thigh and posterior knee skin crease.  No warmth to touch over the skin.  There is induration and pitting edema to bilateral legs.  Psychiatric: She has a normal mood and affect. Her behavior is normal.  Nursing note and vitals reviewed.    ED Treatments / Results  Labs (all labs ordered are listed, but only abnormal results are displayed) Labs Reviewed - No data to display  EKG  EKG Interpretation None       Radiology Dg Chest 2 View  Result Date: 11/05/2017 CLINICAL DATA:  CHF. EXAM: CHEST  2 VIEW COMPARISON:  11/05/2017  FINDINGS: Stable cardiomegaly. Interval decrease in pulmonary vascular congestion since recent comparison. No pneumonic consolidation. No acute  osseous abnormality. Mediastinal contours are stable. IMPRESSION: Stable cardiomegaly. Interval decrease in pulmonary vascular congestion. Electronically Signed   By: Ashley Royalty M.D.   On: 11/05/2017 23:51    Procedures Procedures (including critical care time)  Medications Ordered in ED Medications - No data to display   Initial Impression / Assessment and Plan / ED Course  I have reviewed the triage vital signs and the nursing notes.  Pertinent labs & imaging results that were available during my care of the patient were reviewed by me and considered in my medical decision making (see chart for details).     Patient with what looks like early stage I skin breakdown to bilateral buttocks and sacrum from sitting in her recliner.  She has swelling to bilateral lower legs as well.  There is no full breakdown in the skin.  I discussed at length with patient turning over from side to side in her recliner may be having her brother put a pillow or some type to support under one side.  The patient agreed.  She did explain it is hard for her to turn side to side because recliners a small, but she states she will try her best.  I also spoke with the case manager Mariann Laster and with a social worker here in the emergency department again, who will touch base with her and again explained to her all of the options.  Patient was cleaned up, barrier cream applied to the buttock, will discharge home.  At this time she does not qualify for inpatient admission for medical problems.  She is stable for discharge home.  Vitals:   11/07/17 1945 11/07/17 2000 11/07/17 2100 11/07/17 2145  BP: 130/84 110/68  (!) 126/52  Pulse: 80 79  88  Resp:   20 20  Temp:      TempSrc:      SpO2: 100% 100%  100%  Weight:         Final Clinical Impressions(s) / ED Diagnoses   Final diagnoses:  Edema with potential skin breakdown    ED Discharge Orders    None       Jeannett Senior, PA-C 11/08/17 [place],  [place], DO 11/08/17 2004

## 2017-11-07 NOTE — ED Triage Notes (Signed)
Pt from home with ems c.o cellulitis to bilateral legs and pressure ulcer to sacrum due to sitting in her chair for 1 week. Very foul odor present upon assessment. Pt has been seen her multiples times within the past month. Pt requesting a referral to a rehab facility. Pt a.o, VSS, nad

## 2017-11-07 NOTE — Care Management (Signed)
ED CM noted patient to be active with [facility] services. Patient reported that [facility] has not been out yet. CM attempted to contact [facility] on-call nurse to confirm services.  CSW have been involved with assisting with placement patient unwilling surrender income to facility for placement.  Shared information with  [facility] EDP.

## 2017-11-10 ENCOUNTER — Emergency Department (HOSPITAL_COMMUNITY)
Admission: EM | Admit: 2017-11-10 | Discharge: 2017-11-11 | Disposition: A | Discharge status: Home / Self Care | Payer: Medicare Other | Attending: Emergency Medicine | Admitting: Emergency Medicine

## 2017-11-10 ENCOUNTER — Other Ambulatory Visit: Payer: Self-pay | Admitting: *Deleted

## 2017-11-10 DIAGNOSIS — Z87891 Personal history of nicotine dependence: Secondary | ICD-10-CM | POA: Diagnosis not present

## 2017-11-10 DIAGNOSIS — R600 Localized edema: Secondary | ICD-10-CM | POA: Diagnosis not present

## 2017-11-10 DIAGNOSIS — I110 Hypertensive heart disease with heart failure: Secondary | ICD-10-CM | POA: Insufficient documentation

## 2017-11-10 DIAGNOSIS — J449 Chronic obstructive pulmonary disease, unspecified: Secondary | ICD-10-CM | POA: Diagnosis not present

## 2017-11-10 DIAGNOSIS — Z79899 Other long term (current) drug therapy: Secondary | ICD-10-CM | POA: Insufficient documentation

## 2017-11-10 DIAGNOSIS — E1140 Type 2 diabetes mellitus with diabetic neuropathy, unspecified: Secondary | ICD-10-CM | POA: Diagnosis not present

## 2017-11-10 DIAGNOSIS — M7918 Myalgia, other site: Secondary | ICD-10-CM | POA: Insufficient documentation

## 2017-11-10 DIAGNOSIS — M545 Low back pain, unspecified: Secondary | ICD-10-CM

## 2017-11-10 DIAGNOSIS — I5032 Chronic diastolic (congestive) heart failure: Secondary | ICD-10-CM | POA: Insufficient documentation

## 2017-11-10 DIAGNOSIS — M5489 Other dorsalgia: Secondary | ICD-10-CM | POA: Diagnosis not present

## 2017-11-10 DIAGNOSIS — R52 Pain, unspecified: Secondary | ICD-10-CM | POA: Diagnosis not present

## 2017-11-10 DIAGNOSIS — M791 Myalgia, unspecified site: Secondary | ICD-10-CM | POA: Diagnosis not present

## 2017-11-10 DIAGNOSIS — R609 Edema, unspecified: Secondary | ICD-10-CM | POA: Insufficient documentation

## 2017-11-10 MED ORDER — IBUPROFEN 200 MG PO TABS: 600.0000 mg | ORAL_TABLET | Freq: Once | ORAL | Status: DC

## 2017-11-10 MED ORDER — METHOCARBAMOL 500 MG PO TABS: 750.0000 mg | ORAL_TABLET | Freq: Once | ORAL | Status: DC

## 2017-11-10 NOTE — Patient Outreach (Addendum)
[facility]) Care Management  11/10/2017  AYDE RECORD 06-23-1961 [id number]   CSW was able to make contact with patient today to follow-up regarding recent chain of Emergency Department visits at [place] noted that patient has presented to the Emergency Department on five different occasions, in the 9 days since she was discharged from [facility] where patient was residing to receive short-term rehabilitative services.  Patient admitted that she continues to go to the Emergency Department, with the hopes of being placed in a skilled nursing facility for continued rehabilitative services, as patient is unable to stand without maximum assistance.  Patient is also presenting to the Emergency Department to "get cleaned up", as she is having to sit in her urine and feces.  Patient has already met with the home health nurse from [facility], who reports that patient does not meet criteria for their services at this time, as patient is unable to participate with therapies, both physical and occupational.  Patient went on to say that she is scheduled to meet with a home health social worker from [place], today (Monday, November 10, 2017) at 1:30PM to work on skilled nursing level placement from home.  Patient also reported that she is working with Casimer Lanius, Social Worker with [facility] on long-term care placement arrangements for both patient and her brother, Osha Rane in an assisted living facility, after patient has completed rehabilitative services in a skilled facility.  CSW offered counseling and supportive services, as patient appeared to be quite anxious and upset with the recent chain of events.  Patient agreed to follow-up with CSW, after she meets with the home health social worker today, to report findings.  CSW encouraged patient to give the home health social worker  CSW's contact information to be of assistance during the placement process.  Patient voiced understanding and was agreeable to this plan.  CSW will make arrangements to follow-up with patient within the next week, if a return call is not received in the meantime. [facility] CM Care Plan Problem One     Most Recent Value  Care Plan Problem One  Patient is unable to perform activities of daily living independently.  Role Documenting the Problem One  Clinical Social Worker  Care Plan for Problem One  Active  [facility] Long Term Goal   Patient will receive skilled nursing placement for short-term rehabilitative services, within the next 45 days.  [facility] Long Term Goal Start Date  09/23/17  [facility] Long Term Goal Met Date  09/29/17  Interventions for Problem One Long Term Goal  CSW will assist patient with placement into a skilled nursing facility for rehabiitative services.  [facility] CM Short Term Goal #1   CSW will request a completed and signed FL-2 Form from patient's primary care physician, within the next two weeks.  [facility] CM Short Term Goal #1 Start Date  09/23/17  [facility] CM Short Term Goal #1 Met Date  09/29/17  Interventions for Short Term Goal #1  CSW will pick up the completed and signed FL-2 Form from patient's primary care physician's office.  [facility] CM Short Term Goal #2   CSW will fax patient's completed and signed FL-2 Form to all facilities of choice, within the next two weeks.  [facility] CM Short Term Goal #2 Start Date  09/23/17  [facility] CM Short Term Goal #2 Met Date  09/29/17  Interventions for Short Term  Goal #2  CSW will try and obtain bed offers for patient in skilled nursing settings.    [facility] CM Care Plan Problem Two     Most Recent Value  Care Plan Problem Two  Patient will require home care services in the home, upon discharge from the skilled nursing facility.  Role Documenting the Problem Two  Clinical Social Worker  Care Plan for Problem Two  Active  [facility] CM Short Term Goal #1   Patient will decide on a home  health agency of choice, with regards to home care services, within the next 20 days.  [facility] CM Short Term Goal #1 Start Date  10/28/17  [facility] CM Short Term Goal #1 Met Date   11/03/17  Interventions for Short Term Goal #2   CSW will assist patient and social worker at skilled nursing facility with arranging home care services for patient, prior to patient being discharged home.    [facility] CM Care Plan Problem Three     Most Recent Value  Care Plan Problem Three  Level of care issues.  Role Documenting the Problem Three  Clinical Social Worker  Care Plan for Problem Three  Active  [facility] CM Short Term Goal #1   Patient will receive home health services to assist with activities of daily living, within the next 30 days.  [facility] CM Short Term Goal #1 Start Date  11/04/17  [facility] CM Short Term Goal #1 Met Date  11/10/17  Interventions for Short Term Goal #1  Home health services have been arranged for patient and services will begin this week.     [name], [name], [name], [name]  Licensed Education officer, [employer]  [address] [place] [address] [place], [place] [address] Physical Address-[address], [place], [place] [address] Toll Free Main # [phone] Fax # [phone] Cell # [phone]  Office # [phone] [name]

## 2017-11-10 NOTE — ED Provider Notes (Signed)
[facility] DEPT Provider Note   CSN: [id number] Arrival date & time: 11/10/17  2134     History   Chief Complaint No chief complaint on file.   HPI Kathleen Roy is a 56 y.o. female.  HPI  56 y.o. female with a hx of CHF, COPD, HTN, HLD, Morbid Obesity, Peripheral Edema, DM2, presents to the Emergency Department today for rehab placement. This is patient's 7th Emergency Department this month for same. Pt was discharged from rehab facility on November 17th due to no longer having insurance to cover her stay. Pt unable to pay out of pocket. Social work has seen this morning. Pt states that she sits in a recliner all day and is unable to turn, get up, or walk. States that her brother gets food for her, but has been ill himself recently. Notes coming to the ED to get "cleaned up."  Social work note to today shows that pt refuses to pay out of pocket for SNF due to needing to care for herself and her brother as well as her cat. Today pt states that she mainly wished to get cleaned up. Denies CP/SOB/ABD pain. No N/V/D. Notes lower back pain and states this is likely due to lying in recliner. No dysuria. No vaginal bleeding/discharge. Pt states that she is currently on ABX that she started today for cellulitis of her lower extremities. Denies pain in lower extremities. No redness. No fevers. No other symptoms noted   Past Medical History:  Diagnosis Date  . Arthritis    "I feel like it's everywhere" (09/24/2017  . Cellulitis and abscess of lower extremity 08/16/2016   right leg  . CHF (congestive heart failure) ([place])   . Chronic lower back pain   . COPD (chronic obstructive pulmonary disease) ([place])   . Depression   . Fibromyalgia   . Headache    "once q couple months now" (09/24/2017)  . Hyperlipidemia   . Hypertension   . HYPERTENSION, BENIGN SYSTEMIC 02/12/2007  . Memory disturbance   . Migraines 1970's - <2000   "they just went away"  . Neuropathy    "bad in my right hand and in both feet" (09/24/2017)  . Normal echocardiogram 05/30/05   suboptimal study  . Obesity, morbid (more than 100 lbs over ideal weight or BMI > 40) (HCC)    obese since childhood  . On home oxygen therapy    "4L; 24/7" (09/24/2017)  . Osteoarthritis   . Peripheral edema   . Pressure ulcer of foot    left  . Sepsis ([place])   . Shortness of breath dyspnea   . Sleep apnea 10/2014   "couldn't tolerate the mask" (09/24/2017)  . Type II diabetes mellitus [facility])     Patient Active Problem List   Diagnosis Date Noted  . Candidal dermatitis 09/25/2017  . Requires daily assistance for activities of daily living (ADL) and comfort needs   . Weakness 09/24/2017  . Other fatigue   . Altered mental status 09/20/2017  . 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 Allergic rhinitis 08/15/2017  . Immobility 08/15/2017  . Fibromyalgia 06/16/2017  . Chronic pain of both knees   . Acute respiratory failure with hypercapnia ([place]) 10/15/2016  . Type 2 diabetes mellitus ([place]) 02/14/2016  . Chronic diastolic congestive heart failure ([place])   . Essential hypertension   . OSA (obstructive sleep apnea)   . Obesity hypoventilation syndrome ([place])   . Chronic respiratory failure with hypoxia ([place])   . Hyperglycemia  11/08/2014  . Memory loss 03/18/2014  . Venous stasis dermatitis of both lower extremities 12/02/2012  . COPD (chronic obstructive pulmonary disease) ([place]) 10/15/2011  . Peripheral neuropathy ([place]) 10/15/2011  . BACK PAIN, LUMBAR, CHRONIC 01/22/2011  . Morbid obesity with BMI of 70 and over, adult ([place]) 11/16/2007  . Major depressive disorder, recurrent episode ([place]) 02/12/2007    Past Surgical History:  Procedure Laterality Date  . ABDOMINAL HERNIA REPAIR  2002  . APPENDECTOMY  1995  . CHOLECYSTECTOMY OPEN  1995  . HERNIA REPAIR    . TOTAL ABDOMINAL HYSTERECTOMY  1994    OB History    Gravida Para Term Preterm AB Living   0 0 0 0 0 0   SAB TAB Ectopic Multiple Live Births    0 0 0 0         Home Medications    Prior to Admission medications   Medication Sig Start Date End Date Taking? Authorizing Provider  polyethylene glycol (MIRALAX / GLYCOLAX) packet Take 17 g by mouth daily. Mix in 8 oz liquid and drink   Yes [provider]  torsemide (DEMADEX) 20 MG tablet Take 40 mg by mouth 2 (two) times daily.   Yes [provider]  venlafaxine XR (EFFEXOR-XR) 150 MG 24 hr capsule Take 300 mg by mouth daily.    Yes [provider]  ARIPiprazole (ABILIFY) 2 MG tablet Take 1 tablet (2 mg total) by mouth every other day. 12/18/16   Mayo, Pete Pelt, MD  Ascorbic Acid (VITAMIN C) 1000 MG tablet Take 3,000 mg daily by mouth.     [provider]  atorvastatin (LIPITOR) 10 MG tablet Take 1 tablet (10 mg total) by mouth at bedtime. Patient not taking: Reported on 11/01/2017 03/27/17   Mayo, Pete Pelt, MD  cephALEXin (KEFLEX) 500 MG capsule Take 1 capsule (500 mg total) by mouth 4 (four) times daily. 11/06/17   Doristine Devoid, PA-C  cetirizine (ZYRTEC) 10 MG tablet Take 1 tablet (10 mg total) by mouth daily. 08/15/17   Mayo, Pete Pelt, MD  cyclobenzaprine (FLEXERIL) 10 MG tablet Take 1 tablet (10 mg total) by mouth 3 (three) times daily as needed for muscle spasms. Patient not taking: Reported on 11/05/2017 12/18/16   Mayo, Pete Pelt, MD  diclofenac sodium (VOLTAREN) 1 % GEL Apply 2 g 4 (four) times daily topically. Patient not taking: Reported on 11/05/2017 11/01/17   Langston Masker B, PA-C  fluticasone furoate-vilanterol (BREO ELLIPTA) 100-25 MCG/INH AEPB Inhale 1 puff daily into the lungs.    [provider]  Fluticasone-Salmeterol (ADVAIR) 100-50 MCG/DOSE AEPB Inhale 1 puff into the lungs 2 (two) times daily.    [provider]  nystatin (NYSTATIN) powder Apply 1 g topically daily as needed. Apply topically to affected area as needed for rash/itching     [provider]  Omega-3 Fatty Acids (FISH OIL) 1000 MG CAPS  Take 2 capsules (2,000 mg total) by mouth at bedtime. 06/10/16   Mayo, Pete Pelt, MD  oxyCODONE-acetaminophen (PERCOCET) 10-325 MG tablet Take one tablet by mouth every 6 hours as needed for pain Patient taking differently: Take 1 tablet every 6 (six) hours as needed by mouth for pain.  09/23/17   Mayo, Pete Pelt, MD  OXYGEN Inhale 4 L into the lungs continuous.     [provider]  potassium chloride SA (K-DUR,KLOR-CON) 20 MEQ tablet Take 2 tablets once a day. Patient taking differently: Take 40 mEq at bedtime by mouth.  03/04/17   Mayo, Pete Pelt, MD  pregabalin (LYRICA) 150 MG capsule Take 1 capsule (150 mg total) by mouth 2 (two) times daily. 07/28/17   Gerlene Fee, NP  trazodone (DESYREL) 300 MG tablet Take 1 tablet (300 mg total) by mouth at bedtime. 10/07/17   Mayo, Pete Pelt, MD    Family History Family History  Problem Relation Age of Onset  . Stroke Mother   . Dementia Father   . Prostate cancer Father   . Congestive Heart Failure Brother   . Diabetes Brother   . Hypertension Brother     Social History Social History   Tobacco Use  . Smoking status: Former Smoker    Packs/day: 1.00    Years: 30.00    Pack years: 30.00    Types: Cigarettes    Last attempt to quit: 01/05/2009    Years since quitting: 8.8  . Smokeless tobacco: Never Used  Substance Use Topics  . Alcohol use: No    Alcohol/week: 0.0 oz  . Drug use: No     Allergies   Aspirin   Review of Systems Review of Systems ROS reviewed and all are negative for acute change except as noted in the HPI.  Physical Exam Updated Vital Signs BP 137/64   Pulse 90   Temp 97.8 F (36.6 C) (Oral)   Resp 20   SpO2 100%   Physical Exam  Constitutional: She is oriented to person, place, and time. Vital signs are normal. She appears well-developed and well-nourished. No distress.  Morbidly Obese  HENT:  Head: Normocephalic and atraumatic.  Right Ear: Hearing, tympanic membrane, external ear and ear  canal normal.  Left Ear: Hearing, tympanic membrane, external ear and ear canal normal.  Nose: Nose normal.  Mouth/Throat: Uvula is midline, oropharynx is clear and moist and mucous membranes are normal. No trismus in the jaw. No oropharyngeal exudate, posterior oropharyngeal erythema or tonsillar abscesses.  Eyes: Conjunctivae and EOM are normal. Pupils are equal, round, and reactive to light.  Neck: Normal range of motion. Neck supple. No tracheal deviation present.  Cardiovascular: Normal rate, regular rhythm, S1 normal, S2 normal, normal heart sounds, intact distal pulses and normal pulses.  Pulmonary/Chest: Effort normal and breath sounds normal. No respiratory distress. She has no decreased breath sounds. She has no wheezes. She has no rhonchi. She has no rales.  Abdominal: Normal appearance and bowel sounds are normal. There is no tenderness.  Musculoskeletal: Normal range of motion.  BLE without pitting edema. No erythema. NVI. Distal pulses appreciated. ROM intact  Neurological: She is alert and oriented to person, place, and time.  Skin: Skin is warm and dry.  Psychiatric: She has a normal mood and affect. Her speech is normal and behavior is normal. Thought content normal.  Nursing note and vitals reviewed.    ED Treatments / Results  Labs (all labs ordered are listed, but only abnormal results are displayed) Labs Reviewed  CBC - Abnormal; Notable for the following components:      Result Value   RBC 3.54 (*)    Hemoglobin 10.0 (*)    HCT 34.3 (*)    MCHC 29.2 (*)    All other components within normal limits  I-STAT CHEM 8, ED - Abnormal; Notable for the following components:   Chloride 90 (*)    Glucose, Bld 105 (*)    TCO2 44 (*)    Hemoglobin 11.6 (*)    HCT 34.0 (*)    All  other components within normal limits  BRAIN NATRIURETIC PEPTIDE  I-STAT CHEM 8, ED    EKG  EKG Interpretation None       Radiology No results found.  Procedures Procedures  (including critical care time)  Medications Ordered in ED Medications - No data to display   Initial Impression / Assessment and Plan / ED Course  I have reviewed the triage vital signs and the nursing notes.  Pertinent labs & imaging results that were available during my care of the patient were reviewed by me and considered in my medical decision making (see chart for details).  Final Clinical Impressions(s) / ED Diagnoses  {I have reviewed and evaluated the relevant laboratory values.   {I have reviewed the relevant previous healthcare records.  {I obtained HPI from historian.   ED Course:  Assessment: Pt is a 56 y.o. female with a hx of CHF, COPD, HTN, HLD, Morbid Obesity, Peripheral Edema, DM2, presents to the Emergency Department today for rehab placement. This is patient's 7th Emergency Department this month for same. Pt was discharged from rehab facility on November 17th due to no longer having insurance to cover her stay. Pt unable to pay out of pocket. Social work has seen this morning. Pt states that she sits in a recliner all day and is unable to turn, get up, or walk. States that her brother gets food for her, but has been ill himself recently. Notes coming to the ED to get "cleaned up."  Social work note to today shows that pt refuses to pay out of pocket for SNF due to needing to care for herself and her brother as well as her cat. Today pt states that she mainly wished to get cleaned up. Denies CP/SOB/ABD pain. No N/V/D. Notes lower back pain and states this is likely due to lying in recliner. No dysuria. No vaginal bleeding/discharge. Pt states that she is currently on ABX that she started today for cellulitis of her lower extremities. Denies pain in lower extremities. No redness. No fevers. On exam, pt in NAD. Nontoxic/nonseptic appearing. VSS. Afebrile. Lungs CTA. Heart RRR. Abdomen nontender soft. Labs unremarkable. Social work involved and appreciate assistance. Will DC patient  home with follow up with Social work and PCP. Pt states she is likely going to have to pay out of pocket if she wishes to get SNF placement. Plan is to DC home. At time of discharge, Patient is in no acute distress. Vital Signs are stable. Patient able to tolerate PO.   Disposition/Plan:  DC Home Additional Verbal discharge instructions given and discussed with patient.  Pt Instructed to f/u with PCP in the next week for evaluation and treatment of symptoms. Return precautions given Pt acknowledges and agrees with plan  Supervising Physician Duffy Bruce, MD  Final diagnoses:  Acute bilateral low back pain without sciatica  Musculoskeletal pain  Peripheral edema    ED Discharge Orders    None       Shary Decamp, PA-C 11/11/17 [id number]    Duffy Bruce, MD 11/11/17 1314

## 2017-11-10 NOTE — ED Triage Notes (Signed)
V/s on arrival 118/56, pulse 90, home oxygen 4 liters COPD. Pt lives with her brother. Pt not ambulating recently, related to osteoarthritis. Pt is need of social work and placement. Alert x4. Legs are edematous, and pressure ulcers present.

## 2017-11-10 NOTE — ED Notes (Signed)
Spoke with Roderic Palau from social work-states patient has run out of her days at assisted living-states she calls 911 every day to come to the ED to get cleaned up. Social work states she declines to use her monthly check to go to a facility full time.

## 2017-11-10 NOTE — Progress Notes (Addendum)
Consult request has been received. CSW attempting to follow up at present time.  Pt is well-known to this CSW who was originally consulted on 11/19 regarding this pt who was then D/C'd from [facility] ED on 11/20.    Pt D/C'd on 11/20 then the pt had returned to the [facility] ED on 11/21 at approx 10pm after being D/C 'd from the [facility] ED on 11/21 at approx 6:53am.    Pt then returned to the [facility] ED on 11/23 and was D/C'd. Pt now return to the [facility] ED today (11/26).  After reviewing chart pt had been seen in the [facility] ED on 10/6, admitted then refused SNF placement from the [facility] medical floor CSW on 10/8 stating she preferred to D/C home with her brother. Pt was D/C'd on 10/10, per notes. Pt had stated in the notes on 10/8 she had been in a skilled nursing facility( for 4 1/2 months at one time).  Pt had been offered placement asistance by the Medical Director at a previous visit to the [facility] ED (11/20, per notes).  This was, per notes, because the pt could not participate in therapy (PT).  Per notes, pt was at [facility] for almost a month and could not participate in therapy.    Per notes, pt was then told she would have to private pay to remain and pt had D/C'd saying she could not afford to private pay and she did not want to "give up her check" due to her and her brother needing the check to pay for their needs..  Pt had turned down offer extended by our medical director on 11/20 for long-term placement paid for if she were to agree to sign over her check to partly pay for the stay. Pt had refused citing her and her brother needing the check to care for themselves and also citing the need to remain to care for her cat. (See this writer's previous notes on 11/23 at 7:14pm and 7:18pm).  Pt had refused again to agree to sign over her check on her last visit on 11/23 where CSW had provided pt with SNF/ALF list.  Pt had stated she wanted placement for her AND her brothe simultaneously which this writer stated cannot be  done by [facility] since pt's brother is not a current and appropriate [facility] patient.  Pt may or may not be able to qualify for insurance authorized days once this 30 day period is up, but it is not guaranteed as pt usually must have an injury and/or acute condition that is seen as rehab-able by pt's insurance which is [facility] [employer].  On last visit CSW educated the pt on picking a facility from the provided list that listed both SNF's and ALF's and then calling a facility to see if her and her brother could be admitted together and secondly, going to her PCP to seek assistance with dual-placement. Per pt her PCP had stated they could assist with placement.  Per notes, pt is working with [name] at [facility] and [facility] and both have been notified pt had D/C'd  Pt has been agreeable with this plan on 11/23. CSW will update RN and EDP.  CSW will continue to follow for D/C needs.  Alphonse Guild. Delorese Sellin, [name], LCAS, CSI Clinical Social Worker Ph: [phone]

## 2017-11-10 NOTE — Discharge Instructions (Signed)
Please read and follow all provided instructions.  Your diagnoses today include:  1. Acute bilateral low back pain without sciatica   2. Musculoskeletal pain   3. Peripheral edema     Tests performed today include: Vital signs. See below for your results today.   Medications prescribed:  Take as prescribed   Home care instructions:  Follow any educational materials contained in this packet.  Follow-up instructions: Please follow-up with your primary care provider for further evaluation of symptoms and treatment   Return instructions:  Please return to the Emergency Department if you do not get better, if you get worse, or new symptoms OR  - Fever (temperature greater than 101.55F)  - Bleeding that does not stop with holding pressure to the area    -Severe pain (please note that you may be more sore the day after your accident)  - Chest Pain  - Difficulty breathing  - Severe nausea or vomiting  - Inability to tolerate food and liquids  - Passing out  - Skin becoming red around your wounds  - Change in mental status (confusion or lethargy)  - New numbness or weakness    Please return if you have any other emergent concerns.  Additional Information:  Your vital signs today were: BP 137/64    Pulse 90    Temp 97.8 F (36.6 C) (Oral)    Resp 20    SpO2 100%  If your blood pressure (BP) was elevated above 135/85 this visit, please have this repeated by your doctor within one month. ---------------

## 2017-11-10 NOTE — ED Notes (Signed)
I attempted to collect labs and was unsuccessful. 

## 2017-11-10 NOTE — ED Notes (Signed)
Bed: QV95 Expected date:  Expected time:  Means of arrival:  Comments: EMS 56 yo female from home/from rehab-sitting in urine for 5 days-morbidly obese

## 2017-11-10 NOTE — Progress Notes (Addendum)
CSW met w/pt who confirmed she had D/C'd from [facility] a "week or two ago" after being there for "almost a month"..  CSW reiterated pt's choices and pt again stated she was not ready to sign over her check and D/C eventually to a SNF, but that she wanted to call her PCP on 11/27 because the social worker on duty there did not return until tomorrow 11/27.  Pt stated she has looked through the previously offered SNF/ALF lists provided by this writer on 11/23.  Pt states she wants to seek previously offered assistance from her PCP to let them facilitate admittance for her and her brother simultaneously because "we have just each other".  CSW actively validated the pt's opinions and feelings and provided feedback.  CSW counseled pt on seeking placement while pt could make good decisions due to her physical state and encouraged pt on not giving up on seeking to strengthen herself in order to be able to perform PT if SNF placement is offered and to not give up hope that she can get better through following doctors' advice.  Pt is in agreement with the plan to D/C home and seek assistance from her PCP in placement for her and her brother. Pt stated her brothe was expecting the pt to return home.  Pt was appreciative and thanked the CSW.  CDSW will update EDP and CN was updated.  11:46 PM EDP updated.  Please reconsult if future social work needs arise.  CSW signing off, as social work intervention is no longer needed.  Kathleen Guild. Braxley Balandran, [name], LCAS, CSI Clinical Social Worker Ph: [phone]

## 2017-11-11 ENCOUNTER — Ambulatory Visit: Payer: Self-pay | Admitting: *Deleted

## 2017-11-11 ENCOUNTER — Encounter: Payer: Self-pay | Admitting: Licensed Clinical Social Worker

## 2017-11-11 DIAGNOSIS — I10 Essential (primary) hypertension: Secondary | ICD-10-CM | POA: Diagnosis not present

## 2017-11-11 LAB — I-STAT CHEM 8, ED
BUN: 14 mg/dL (ref 6–20)
CALCIUM ION: 1.2 mmol/L (ref 1.15–1.40)
CREATININE: 0.8 mg/dL (ref 0.44–1.00)
Chloride: 90 mmol/L — ABNORMAL LOW (ref 101–111)
GLUCOSE: 105 mg/dL — AB (ref 65–99)
HCT: 34 % — ABNORMAL LOW (ref 36.0–46.0)
HEMOGLOBIN: 11.6 g/dL — AB (ref 12.0–15.0)
Potassium: 3.8 mmol/L (ref 3.5–5.1)
Sodium: 144 mmol/L (ref 135–145)
TCO2: 44 mmol/L — AB (ref 22–32)

## 2017-11-11 LAB — CBC
HEMATOCRIT: 34.3 % — AB (ref 36.0–46.0)
HEMOGLOBIN: 10 g/dL — AB (ref 12.0–15.0)
MCH: 28.2 pg (ref 26.0–34.0)
MCHC: 29.2 g/dL — AB (ref 30.0–36.0)
MCV: 96.9 fL (ref 78.0–100.0)
Platelets: 193 10*3/uL (ref 150–400)
RBC: 3.54 MIL/uL — ABNORMAL LOW (ref 3.87–5.11)
RDW: 14.7 % (ref 11.5–15.5)
WBC: 5.6 10*3/uL (ref 4.0–10.5)

## 2017-11-11 LAB — BRAIN NATRIURETIC PEPTIDE: B Natriuretic Peptide: 18.8 pg/mL (ref 0.0–100.0)

## 2017-11-11 NOTE — NC FL2 (Signed)
[place] LEVEL OF CARE SCREENING TOOL     IDENTIFICATION  [name] Birthdate: Mar 14, 1961 Sex: female Admission Date (Current Location):   [place] Number:  Herbalist and Address:         Provider Number: [id number]  Attending Physician Name and Address:  Dr. Jackalyn Lombard   [facility] [address] [place] [address]  Relative Name and Phone Number:  Lansky,Don, Brother: [phone] (home)  [phone] (mobile)     Current Level of Care: Home Recommended Level of Care: [place] Prior Approval Number:    Date Approved/Denied:   PASRR Number: [id number]  Discharge Plan: SNF    Current Diagnoses: Patient Active Problem List   Diagnosis Date Noted  . Candidal dermatitis 09/25/2017  . Requires daily assistance for activities of daily living (ADL) and comfort needs   . Weakness 09/24/2017  . Other fatigue   . Altered mental status 09/20/2017  . 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 Allergic rhinitis 08/15/2017  . Immobility 08/15/2017  . Fibromyalgia 06/16/2017  . Chronic pain of both knees   . Acute respiratory failure with hypercapnia ([place]) 10/15/2016  . Type 2 diabetes mellitus ([place]) 02/14/2016  . Chronic diastolic congestive heart failure ([place])   . Essential hypertension   . OSA (obstructive sleep apnea)   . Obesity hypoventilation syndrome ([place])   . Chronic respiratory failure with hypoxia ([place])   . Hyperglycemia 11/08/2014  . Memory loss 03/18/2014  . Venous stasis dermatitis of both lower extremities 12/02/2012  . COPD (chronic obstructive pulmonary disease) ([place]) 10/15/2011  . Peripheral neuropathy ([place]) 10/15/2011  . BACK PAIN, LUMBAR, CHRONIC 01/22/2011  . Morbid obesity with BMI of 70 and over, adult ([place]) 11/16/2007  . Major depressive disorder, recurrent episode ([place]) 02/12/2007    Orientation RESPIRATION BLADDER Height & Weight     Self, Time,  Situation, Place  O2(4L) Incontinent Weight:  375lb Height:     BEHAVIORAL SYMPTOMS/MOOD NEUROLOGICAL BOWEL NUTRITION STATUS      Continent Diet(heart healthy)  AMBULATORY STATUS COMMUNICATION OF NEEDS Skin   Total Care(Patient unable to ambulate ) Verbally Other (Comment)(Cellulitis right/left foot and leg; MASD to abdomen breast, buttocks, groin))                       Personal Care Assistance Level of Assistance  Bathing, Feeding, Dressing Bathing Assistance: Maximum assistance Feeding assistance: Independent Dressing Assistance: Maximum assistance     Functional Limitations Info  Sight, Hearing, Speech Sight Info: Adequate Hearing Info: Adequate Speech Info: Adequate    SPECIAL CARE FACTORS FREQUENCY                       Contractures Contractures Info: Not present    Additional Factors Info  Allergies   Allergies Info: Allergies Info: Aspirin           Current Medications (11/11/2017):   Current Outpatient Medications  Medication Sig Dispense Refill  . ARIPiprazole (ABILIFY) 2 MG tablet Take 1 tablet (2 mg total) by mouth every other day. 45 tablet 0  . Ascorbic Acid (VITAMIN C) 1000 MG tablet Take 3,000 mg daily by mouth.     . cephALEXin (KEFLEX) 500 MG capsule Take 1 capsule (500 mg total) by mouth 4 (  four) times daily. 20 capsule 0  . cetirizine (ZYRTEC) 10 MG tablet Take 1 tablet (10 mg total) by mouth daily. 90 tablet 2  . diclofenac sodium (VOLTAREN) 1 % GEL Apply 2 g 4 (four) times daily topically. 1 Tube 0  . fluticasone furoate-vilanterol (BREO ELLIPTA) 100-25 MCG/INH AEPB Inhale 1 puff daily into the lungs.    . Fluticasone-Salmeterol (ADVAIR) 100-50 MCG/DOSE AEPB Inhale 1 puff into the lungs 2 (two) times daily.    Marland Kitchen nystatin (NYSTATIN) powder Apply 1 g topically daily as needed. Apply topically to affected area as needed for rash/itching     . Omega-3 Fatty Acids (FISH OIL) 1000 MG CAPS Take 2 capsules (2,000 mg total) by mouth at bedtime.  60 capsule 2  . oxyCODONE-acetaminophen (PERCOCET) 10-325 MG tablet Take one tablet by mouth every 6 hours as needed for pain (Patient taking differently: Take 1 tablet every 6 (six) hours as needed by mouth for pain. ) 180 tablet 0  . OXYGEN Inhale 4 L into the lungs continuous.     . polyethylene glycol (MIRALAX / GLYCOLAX) packet Take 17 g by mouth daily. Mix in 8 oz liquid and drink    . potassium chloride SA (K-DUR,KLOR-CON) 20 MEQ tablet Take 2 tablets once a day. (Patient taking differently: Take 40 mEq at bedtime by mouth. ) 180 tablet 0  . pregabalin (LYRICA) 150 MG capsule Take 1 capsule (150 mg total) by mouth 2 (two) times daily. 60 capsule 0  . torsemide (DEMADEX) 20 MG tablet Take 40 mg by mouth 2 (two) times daily.    . trazodone (DESYREL) 300 MG tablet Take 1 tablet (300 mg total) by mouth at bedtime. 90 tablet 0  . venlafaxine XR (EFFEXOR-XR) 150 MG 24 hr capsule Take 300 mg by mouth daily.      No current facility-administered medications for this visit.      Discharge Medications:  Relevant Imaging Results:  Relevant Lab Results:   Additional Information ss#[id number]  Maurine Cane, [name]

## 2017-11-11 NOTE — Progress Notes (Signed)
Type of Service: Clinical Social Work Consult  KARELYN BRISBY is a 56 y.o. female referred for assistance with placement [name] received a call from patient.  Per patient [facility] is unable to meet patient's needs as she needs a higher level of care. They will not return and the [facility] Education officer, museum did not show.  Patient contacted [facility] , they are unable to meet her needs.   Patient reports sitting in urine for several days.  States APS recently made a home visit. Patient is willing to go to SNF and wants her brother to be placed in ALF.  [name] received a call from [place] [phone].  Patient has an active APS case and they are working on placement.  Requesting [name] to assist with getting the FL2.  Ms. Laverta Baltimore is also willing to assist patient's brother with placement.   [name] left message with Di Kindle [phone] [facility] Social Worker for coordination of services.  Intervention: Reflective listening, supportive counseling, solutions focus strategies, community resources and  Motivational Interviewing. ASSESSMENT:.Patient has explored all options of staying in her home verses going to a facility. At this time patient has no community supportive services, her only caretaker is her brother [name], who is limited in the assistance he is able to provide patient. Patient may benefit from, and is in agreement to go to a skilled nursing facility if her brother can go to an ALF. PLAN: [name] and DSS will coordinate services for patient. 1. [name] will coordinate completion of FL2 with PCP  2. DSS will request copy of FL2 from medical records. 3. DSS will assist patient with placement  Casimer Lanius, [name] Licensed Clinical Social Worker [place]   [phone] 10:28 AM

## 2017-11-12 ENCOUNTER — Other Ambulatory Visit: Payer: Self-pay | Admitting: *Deleted

## 2017-11-12 NOTE — Patient Outreach (Signed)
Kathleen Roy) Care Management  11/12/2017  Kathleen Roy 1961-01-27 [id number]   CSW made an attempt to try and contact patient today to follow-up regarding Roy-term care placement arrangements for patient and her brother, Kathleen Roy; however, neither were available at the time of CSW's call.  A HIPAA compliant message was left for patient and Kathleen Roy on voicemail and CSW is currently awaiting a return call.  CSW will make a second outreach attempt within the next week, if a return call is not received in the meantime. CSW is aware that [facility] has terminated patient from their services, reporting that patient is requiring a higher level of care than what they are able to provide in the home.  With that being said, patient did not receive a visit from the home health social worker from [place] on Monday, November 10, 2017 at 1:30PM, as originally planned. Patient reported, "No body ever showed up or called to cancel".  During that time, a referral was made to Adult Protective Services with the [place], in hopes of initiating emergency placement for patient in a skilled nursing facility.  Patient contacted [facility] but was told that she does not meet criteria for their program.     CSW received a HIPAA compliant message from Kathleen Roy, Licensed Clinical Social Worker with the [facility] indicating that she is working with patient's Adult Materials engineer, Kathleen Roy on initiating placement for patient.  Kathleen Roy was able to generate a new FL-2 Form on patient and provide to Kathleen Roy so that Kathleen Roy can fax patient to all skilled nursing facilities of choice.  The goal is to get patient placed in a skilled nursing facility where she can resume rehabilitative services, both physical and occupational therapies, so that she can eventually be placed with her brother,  Kathleen Roy in an assisted living facility for Roy-term care services.  Kathleen Roy reported to Kathleen Roy that she is willing to assist Kathleen Roy with placement, as well.  At this point, we are just awaiting bed offers. Kathleen Roy, [name], [name], [name]  Licensed Education officer, [facility]  Mailing [place] [address] [place], [place] [address] Physical Address-[address], [place], [place] [address] Toll Free Main # [phone] Fax # [phone] Cell # [phone]  Office # [phone] [email or web address]

## 2017-11-13 ENCOUNTER — Encounter (HOSPITAL_COMMUNITY): Payer: Self-pay | Admitting: Emergency Medicine

## 2017-11-13 ENCOUNTER — Other Ambulatory Visit: Payer: Self-pay

## 2017-11-13 ENCOUNTER — Emergency Department (HOSPITAL_COMMUNITY)
Admission: EM | Admit: 2017-11-13 | Discharge: 2017-11-13 | Disposition: A | Discharge status: Home / Self Care | Payer: Medicare Other | Attending: Emergency Medicine | Admitting: Emergency Medicine

## 2017-11-13 DIAGNOSIS — I110 Hypertensive heart disease with heart failure: Secondary | ICD-10-CM | POA: Insufficient documentation

## 2017-11-13 DIAGNOSIS — R531 Weakness: Secondary | ICD-10-CM | POA: Diagnosis not present

## 2017-11-13 DIAGNOSIS — R404 Transient alteration of awareness: Secondary | ICD-10-CM | POA: Diagnosis not present

## 2017-11-13 DIAGNOSIS — E119 Type 2 diabetes mellitus without complications: Secondary | ICD-10-CM | POA: Diagnosis not present

## 2017-11-13 DIAGNOSIS — R21 Rash and other nonspecific skin eruption: Secondary | ICD-10-CM

## 2017-11-13 DIAGNOSIS — I5032 Chronic diastolic (congestive) heart failure: Secondary | ICD-10-CM | POA: Diagnosis not present

## 2017-11-13 DIAGNOSIS — Z79899 Other long term (current) drug therapy: Secondary | ICD-10-CM | POA: Insufficient documentation

## 2017-11-13 DIAGNOSIS — J449 Chronic obstructive pulmonary disease, unspecified: Secondary | ICD-10-CM | POA: Diagnosis not present

## 2017-11-13 DIAGNOSIS — Z87891 Personal history of nicotine dependence: Secondary | ICD-10-CM | POA: Insufficient documentation

## 2017-11-13 DIAGNOSIS — K591 Functional diarrhea: Secondary | ICD-10-CM | POA: Diagnosis not present

## 2017-11-13 DIAGNOSIS — R100 Acute abdomen: Secondary | ICD-10-CM | POA: Diagnosis not present

## 2017-11-13 NOTE — Telephone Encounter (Signed)
FL-2 form completed and faxed to Venia Minks, [place] at [phone].  ROI placed in scan box to be placed in EMR.    Burna Forts, BSN, RN-BC

## 2017-11-13 NOTE — ED Notes (Signed)
[facility] contacted for discharge transport 

## 2017-11-13 NOTE — ED Provider Notes (Signed)
[facility] DEPT Provider Note   CSN: [id number] Arrival date & time: 11/13/17  1856     History   Chief Complaint Chief Complaint  Patient presents with  . Skin Problem    HPI Kathleen Roy is a 56 y.o. female.  Patient has obesity COPD congestive heart failure and is bedridden.  She was discharged from a nursing home November 17 and has been taking care of at home by her brother.  Her doctor is trying to arrange home health for possible placement.  The patient states that she has been laying in her own urine for 3-4 days and wants to be cleaned up.  Patient has been here before for the same situation   The history is provided by the patient.  Illness  This is a recurrent problem. The current episode started more than 2 days ago. The problem occurs constantly. The problem has not changed since onset.Pertinent negatives include no chest pain, no abdominal pain and no headaches. Nothing aggravates the symptoms. Nothing relieves the symptoms.    Past Medical History:  Diagnosis Date  . Arthritis    "I feel like it's everywhere" (09/24/2017  . Cellulitis and abscess of lower extremity 08/16/2016   right leg  . CHF (congestive heart failure) ([place])   . Chronic lower back pain   . COPD (chronic obstructive pulmonary disease) ([place])   . Depression   . Fibromyalgia   . Headache    "once q couple months now" (09/24/2017)  . Hyperlipidemia   . Hypertension   . HYPERTENSION, BENIGN SYSTEMIC 02/12/2007  . Memory disturbance   . Migraines 1970's - <2000   "they just went away"  . Neuropathy    "bad in my right hand and in both feet" (09/24/2017)  . Normal echocardiogram 05/30/05   suboptimal study  . Obesity, morbid (more than 100 lbs over ideal weight or BMI > 40) (HCC)    obese since childhood  . On home oxygen therapy    "4L; 24/7" (09/24/2017)  . Osteoarthritis   . Peripheral edema   . Pressure ulcer of foot    left  . Sepsis ([place])   .  Shortness of breath dyspnea   . Sleep apnea 10/2014   "couldn't tolerate the mask" (09/24/2017)  . Type II diabetes mellitus [facility])     Patient Active Problem List   Diagnosis Date Noted  . Candidal dermatitis 09/25/2017  . Requires daily assistance for activities of daily living (ADL) and comfort needs   . Weakness 09/24/2017  . Other fatigue   . Altered mental status 09/20/2017  . 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 Allergic rhinitis 08/15/2017  . Immobility 08/15/2017  . Fibromyalgia 06/16/2017  . Chronic pain of both knees   . Acute respiratory failure with hypercapnia ([place]) 10/15/2016  . Type 2 diabetes mellitus ([place]) 02/14/2016  . Chronic diastolic congestive heart failure ([place])   . Essential hypertension   . OSA (obstructive sleep apnea)   . Obesity hypoventilation syndrome ([place])   . Chronic respiratory failure with hypoxia ([place])   . Hyperglycemia 11/08/2014  . Memory loss 03/18/2014  . Venous stasis dermatitis of both lower extremities 12/02/2012  . COPD (chronic obstructive pulmonary disease) ([place]) 10/15/2011  . Peripheral neuropathy ([place]) 10/15/2011  . BACK PAIN, LUMBAR, CHRONIC 01/22/2011  . Morbid obesity with BMI of 70 and over, adult ([place]) 11/16/2007  . Major depressive disorder, recurrent episode ([place]) 02/12/2007    Past Surgical History:  Procedure Laterality Date  .  ABDOMINAL HERNIA REPAIR  2002  . APPENDECTOMY  1995  . CHOLECYSTECTOMY OPEN  1995  . HERNIA REPAIR    . TOTAL ABDOMINAL HYSTERECTOMY  1994    OB History    Gravida Para Term Preterm AB Living   0 0 0 0 0 0   SAB TAB Ectopic Multiple Live Births   0 0 0 0         Home Medications    Prior to Admission medications   Medication Sig Start Date End Date Taking? Authorizing Provider  ARIPiprazole (ABILIFY) 2 MG tablet Take 1 tablet (2 mg total) by mouth every other day. 12/18/16   Mayo, Pete Pelt, MD  Ascorbic Acid (VITAMIN C) 1000 MG tablet Take 3,000 mg daily by mouth.     [provider]    cephALEXin (KEFLEX) 500 MG capsule Take 1 capsule (500 mg total) by mouth 4 (four) times daily. 11/06/17   Doristine Devoid, PA-C  cetirizine (ZYRTEC) 10 MG tablet Take 1 tablet (10 mg total) by mouth daily. 08/15/17   Mayo, Pete Pelt, MD  diclofenac sodium (VOLTAREN) 1 % GEL Apply 2 g 4 (four) times daily topically. 11/01/17   Langston Masker B, PA-C  fluticasone furoate-vilanterol (BREO ELLIPTA) 100-25 MCG/INH AEPB Inhale 1 puff daily into the lungs.    [provider]  Fluticasone-Salmeterol (ADVAIR) 100-50 MCG/DOSE AEPB Inhale 1 puff into the lungs 2 (two) times daily.    [provider]  nystatin (NYSTATIN) powder Apply 1 g topically daily as needed. Apply topically to affected area as needed for rash/itching     [provider]  Omega-3 Fatty Acids (FISH OIL) 1000 MG CAPS Take 2 capsules (2,000 mg total) by mouth at bedtime. 06/10/16   Mayo, Pete Pelt, MD  oxyCODONE-acetaminophen (PERCOCET) 10-325 MG tablet Take one tablet by mouth every 6 hours as needed for pain Patient taking differently: Take 1 tablet every 6 (six) hours as needed by mouth for pain.  09/23/17   Mayo, Pete Pelt, MD  OXYGEN Inhale 4 L into the lungs continuous.     [provider]  polyethylene glycol (MIRALAX / GLYCOLAX) packet Take 17 g by mouth daily. Mix in 8 oz liquid and drink    [provider]  potassium chloride SA (K-DUR,KLOR-CON) 20 MEQ tablet Take 2 tablets once a day. Patient taking differently: Take 40 mEq at bedtime by mouth.  03/04/17   Mayo, Pete Pelt, MD  pregabalin (LYRICA) 150 MG capsule Take 1 capsule (150 mg total) by mouth 2 (two) times daily. 07/28/17   Gerlene Fee, NP  torsemide (DEMADEX) 20 MG tablet Take 40 mg by mouth 2 (two) times daily.    [provider]  trazodone (DESYREL) 300 MG tablet Take 1 tablet (300 mg total) by mouth at bedtime. 10/07/17   Mayo, Pete Pelt, MD  venlafaxine XR (EFFEXOR-XR) 150 MG 24 hr capsule Take 300 mg by mouth  daily.     [provider]    Family History Family History  Problem Relation Age of Onset  . Stroke Mother   . Dementia Father   . Prostate cancer Father   . Congestive Heart Failure Brother   . Diabetes Brother   . Hypertension Brother     Social History Social History   Tobacco Use  . Smoking status: Former Smoker    Packs/day: 1.00    Years: 30.00    Pack years: 30.00    Types: Cigarettes  Last attempt to quit: 01/05/2009    Years since quitting: 8.8  . Smokeless tobacco: Never Used  Substance Use Topics  . Alcohol use: No    Alcohol/week: 0.0 oz  . Drug use: No     Allergies   Aspirin   Review of Systems Review of Systems  Constitutional: Negative for appetite change and fatigue.  HENT: Negative for congestion, ear discharge and sinus pressure.   Eyes: Negative for discharge.  Respiratory: Negative for cough.   Cardiovascular: Negative for chest pain.  Gastrointestinal: Negative for abdominal pain and diarrhea.  Genitourinary: Negative for frequency and hematuria.  Musculoskeletal: Negative for back pain.  Skin: Negative for rash.       Irritation to buttocks on lower legs  Neurological: Negative for seizures and headaches.  Psychiatric/Behavioral: Negative for hallucinations.     Physical Exam Updated Vital Signs BP 117/71 (BP Location: Right Wrist)   Pulse 78   Temp 97.8 F (36.6 C) (Oral)   Resp 10   Ht 5\' 2"  (1.575 m)   Wt (!) 170.1 kg (375 lb)   SpO2 100% Comment: 82 on room air  BMI 68.59 kg/m   Physical Exam  Constitutional: She is oriented to person, place, and time. She appears well-developed.  HENT:  Head: Normocephalic.  Eyes: Conjunctivae and EOM are normal. No scleral icterus.  Neck: Neck supple. No thyromegaly present.  Cardiovascular: Normal rate and regular rhythm. Exam reveals no gallop and no friction rub.  No murmur heard. Pulmonary/Chest: No stridor. She has no wheezes. She has no rales. She exhibits no  tenderness.  Abdominal: She exhibits no distension. There is no tenderness. There is no rebound.  Musculoskeletal: Normal range of motion. She exhibits edema.  3+ edema in both legs  Lymphadenopathy:    She has no cervical adenopathy.  Neurological: She is oriented to person, place, and time. She exhibits normal muscle tone. Coordination normal.  Skin: No rash noted. No erythema.  Patient has mild rash from irritation to buttocks and back of both thighs.  Psychiatric: She has a normal mood and affect.     ED Treatments / Results  Labs (all labs ordered are listed, but only abnormal results are displayed) Labs Reviewed - No data to display  EKG  EKG Interpretation None       Radiology No results found.  Procedures Procedures (including critical care time)  Medications Ordered in ED Medications - No data to display   Initial Impression / Assessment and Plan / ED Course  I have reviewed the triage vital signs and the nursing notes.  Pertinent labs & imaging results that were available during my care of the patient were reviewed by me and considered in my medical decision making (see chart for details).     Patient has rash from laying in urine.  Patient will be cleaned up by the nurses and sent back home.  The patient will discuss with her family doctor whether she should get a Foley placed.  Her family doctor is working on possible placement or home health  Final Clinical Impressions(s) / ED Diagnoses   Final diagnoses:  Rash    ED Discharge Orders    None       Milton Ferguson, MD 11/13/17 2011

## 2017-11-13 NOTE — ED Notes (Signed)
Bed: WA03 Expected date:  Expected time:  Means of arrival:  Comments: EMS-bariatric patient

## 2017-11-13 NOTE — Discharge Instructions (Signed)
Call your family doctor and discuss whether a Foley would be appropriate for you.

## 2017-11-13 NOTE — ED Triage Notes (Addendum)
Pt coming from home with complaint of skin irritation. Pt has been seen multiple times in the last couple weeks within system for same.

## 2017-11-13 NOTE — ED Notes (Signed)
EMT Tech well help of 2 nurses cleaned up Pt from her inability to get up and from urinating on herself for the past 4 days. Pt's folds and crevices were cleaned with no-rinse cleanser, and thoroughly dried. Barrier cream was applied to moist red and irritated areas to help prevent skin breakdown. Pt stated she felt dry when we were finished cleaning her.

## 2017-11-13 NOTE — Progress Notes (Signed)
CSW received a call from RN stating pt has returned and is ready for D/C that pt had presented to the ED after being BIB by EMS to "be cleaned up", due to an inability to do this herself.    CSW spoke with [facility] who state [facility] leadership is reviewing pt's chart to consider reasons for denying services to the pt should the pt continue to refuse placement options offered to her by the hospital.  Pt is well-known to this Probation officer who has spoken to the pt twice before.  Pt has come to our [facility] approx. 7 times in November and twice in October for short-term rehab SNF placement and/or to "be cleaned up", due to pt's incontinence, per RN's. Pt desires short-term SNF's but has been kicked out of [facility] for either refusing PT, or using up her days, or both for this month after being D/C'd on 11/17, per pt. Pt was offered long-term placement assistance by Medical Director at [facility], but continually refuses (3 times with this CSW) to sign over her check because of her brother who the pt states is also needing placement.  Pt states she is also worried about what would happen to her cat.  Per [facility] personnel, pt has been calling EMS to "move furniture around or to get sodas out of the fridge" in the past. Per EMS workers EMS is not getting paid to transport the pt.  Per notes [name] over at [facility] is providing FL-2 to APS who [name] states in her note is assisting pt with placement for her and her brother.   CSW updated the Asst Social Work Mudlogger via Horticulturist, commercial.  Please reconsult if future social work needs arise.  CSW signing off, as social work intervention is no longer needed.  Alphonse Guild. Javarri Segal, [name], LCAS, CSI Clinical Social Worker Ph: [phone]

## 2017-11-16 ENCOUNTER — Emergency Department (HOSPITAL_COMMUNITY)
Admission: EM | Admit: 2017-11-16 | Discharge: 2017-11-16 | Disposition: A | Discharge status: Home / Self Care | Payer: Medicare Other | Attending: Emergency Medicine | Admitting: Emergency Medicine

## 2017-11-16 ENCOUNTER — Other Ambulatory Visit: Payer: Self-pay

## 2017-11-16 ENCOUNTER — Encounter (HOSPITAL_COMMUNITY): Payer: Self-pay | Admitting: Emergency Medicine

## 2017-11-16 DIAGNOSIS — Z79899 Other long term (current) drug therapy: Secondary | ICD-10-CM | POA: Diagnosis not present

## 2017-11-16 DIAGNOSIS — Z87891 Personal history of nicotine dependence: Secondary | ICD-10-CM | POA: Diagnosis not present

## 2017-11-16 DIAGNOSIS — K5900 Constipation, unspecified: Secondary | ICD-10-CM | POA: Insufficient documentation

## 2017-11-16 DIAGNOSIS — J449 Chronic obstructive pulmonary disease, unspecified: Secondary | ICD-10-CM | POA: Diagnosis not present

## 2017-11-16 DIAGNOSIS — I110 Hypertensive heart disease with heart failure: Secondary | ICD-10-CM | POA: Insufficient documentation

## 2017-11-16 DIAGNOSIS — I5032 Chronic diastolic (congestive) heart failure: Secondary | ICD-10-CM | POA: Insufficient documentation

## 2017-11-16 DIAGNOSIS — E119 Type 2 diabetes mellitus without complications: Secondary | ICD-10-CM | POA: Diagnosis not present

## 2017-11-16 NOTE — ED Triage Notes (Signed)
Pt brought in by [facility] from home  [facility] staff states the call came out to them for constipation but when they got there they found the pt sitting in a recliner covered in her own urine and when they moved her she had feces on her bottom   Pt lives with family   Pt has poor hygiene  Pt states she was told she is not in good enough shape to have a home health nurse

## 2017-11-16 NOTE — ED Notes (Signed)
Bed: DB52 Expected date:  Expected time:  Means of arrival:  Comments: 57f constipated

## 2017-11-16 NOTE — ED Notes (Signed)
[facility] called for transport home. 

## 2017-11-16 NOTE — ED Notes (Signed)
Pt states her main reason for coming in tonight is for constipation  States she has not had a decent BM in almost 2 weeks  States she feels like it is there but she cannot get it out  Pt is also c/o legs burning  Pt states she has been sitting in her own urine for the past 3 days  Pt lives with her brother that is older and having health issues of his own so he is not able to do a lot for her except get her something to eat and drink  Pt states she is immobile due to her size  Pt states she is trying to get into a facility but she is waiting on her LF2 forms

## 2017-11-16 NOTE — ED Notes (Signed)
In room with [place], PA  Pt had a large soft formed brown stool  Pt cleaned and repositioned  Tolerated well

## 2017-11-16 NOTE — ED Notes (Signed)
ED Provider at bedside. Pt had a large soft brown stool  Pt cleaned and bed linen and gown changed

## 2017-11-16 NOTE — ED Provider Notes (Signed)
[facility] DEPT Provider Note   CSN: [id number] Arrival date & time: 11/16/17  0037     History   Chief Complaint Chief Complaint  Patient presents with  . Constipation    HPI Kathleen Roy is a 56 y.o. female.  Patient to ED with complaint of constipation. She reports no bowel movement in 2 weeks. No vomiting. She reports being unable to take care of herself and is bed bound. She has not gotten up to the bathroom and has been in bed lying in her own urine for, she reports, 3 days. No fever. She has been straining to pass a bowel movement she feels in the rectum but has been unable.    The history is provided by the patient. No language interpreter was used.  Constipation      Past Medical History:  Diagnosis Date  . Arthritis    "I feel like it's everywhere" (09/24/2017  . Cellulitis and abscess of lower extremity 08/16/2016   right leg  . CHF (congestive heart failure) ([place])   . Chronic lower back pain   . COPD (chronic obstructive pulmonary disease) ([place])   . Depression   . Fibromyalgia   . Headache    "once q couple months now" (09/24/2017)  . Hyperlipidemia   . Hypertension   . HYPERTENSION, BENIGN SYSTEMIC 02/12/2007  . Memory disturbance   . Migraines 1970's - <2000   "they just went away"  . Neuropathy    "bad in my right hand and in both feet" (09/24/2017)  . Normal echocardiogram 05/30/05   suboptimal study  . Obesity, morbid (more than 100 lbs over ideal weight or BMI > 40) (HCC)    obese since childhood  . On home oxygen therapy    "4L; 24/7" (09/24/2017)  . Osteoarthritis   . Peripheral edema   . Pressure ulcer of foot    left  . Sepsis ([place])   . Shortness of breath dyspnea   . Sleep apnea 10/2014   "couldn't tolerate the mask" (09/24/2017)  . Type II diabetes mellitus [facility])     Patient Active Problem List   Diagnosis Date Noted  . Candidal dermatitis 09/25/2017  . Requires daily assistance for activities of  daily living (ADL) and comfort needs   . Weakness 09/24/2017  . Other fatigue   . Altered mental status 09/20/2017  . 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 Allergic rhinitis 08/15/2017  . Immobility 08/15/2017  . Fibromyalgia 06/16/2017  . Chronic pain of both knees   . Acute respiratory failure with hypercapnia ([place]) 10/15/2016  . Type 2 diabetes mellitus ([place]) 02/14/2016  . Chronic diastolic congestive heart failure ([place])   . Essential hypertension   . OSA (obstructive sleep apnea)   . Obesity hypoventilation syndrome ([place])   . Chronic respiratory failure with hypoxia ([place])   . Hyperglycemia 11/08/2014  . Memory loss 03/18/2014  . Venous stasis dermatitis of both lower extremities 12/02/2012  . COPD (chronic obstructive pulmonary disease) ([place]) 10/15/2011  . Peripheral neuropathy ([place]) 10/15/2011  . BACK PAIN, LUMBAR, CHRONIC 01/22/2011  . Morbid obesity with BMI of 70 and over, adult ([place]) 11/16/2007  . Major depressive disorder, recurrent episode ([place]) 02/12/2007    Past Surgical History:  Procedure Laterality Date  . ABDOMINAL HERNIA REPAIR  2002  . APPENDECTOMY  1995  . CHOLECYSTECTOMY OPEN  1995  . HERNIA REPAIR    . TOTAL ABDOMINAL HYSTERECTOMY  1994    OB History    Gravida  Para Term Preterm AB Living   0 0 0 0 0 0   SAB TAB Ectopic Multiple Live Births   0 0 0 0         Home Medications    Prior to Admission medications   Medication Sig Start Date End Date Taking? Authorizing Provider  ARIPiprazole (ABILIFY) 2 MG tablet Take 1 tablet (2 mg total) by mouth every other day. 12/18/16  Yes Mayo, Pete Pelt, MD  Ascorbic Acid (VITAMIN C) 1000 MG tablet Take 3,000 mg daily by mouth.    Yes [provider]  cetirizine (ZYRTEC) 10 MG tablet Take 1 tablet (10 mg total) by mouth daily. 08/15/17  Yes Mayo, Pete Pelt, MD  diclofenac sodium (VOLTAREN) 1 % GEL Apply 2 g 4 (four) times daily topically. Patient taking differently: Apply 2 g topically 4 (four) times daily as  needed (pain).  11/01/17  Yes Murray, Alyssa B, PA-C  fluticasone furoate-vilanterol (BREO ELLIPTA) 100-25 MCG/INH AEPB Inhale 1 puff daily into the lungs.   Yes [provider]  Fluticasone-Salmeterol (ADVAIR) 100-50 MCG/DOSE AEPB Inhale 1 puff into the lungs 2 (two) times daily.   Yes [provider]  nystatin (NYSTATIN) powder Apply 1 g topically daily as needed. Apply topically to affected area as needed for rash/itching    Yes [provider]  Omega-3 Fatty Acids (FISH OIL) 1000 MG CAPS Take 2 capsules (2,000 mg total) by mouth at bedtime. 06/10/16  Yes Mayo, Pete Pelt, MD  oxyCODONE-acetaminophen (PERCOCET) 10-325 MG tablet Take one tablet by mouth every 6 hours as needed for pain Patient taking differently: Take 1 tablet every 6 (six) hours as needed by mouth for pain.  09/23/17  Yes Mayo, Pete Pelt, MD  OXYGEN Inhale 4 L into the lungs continuous.    Yes [provider]  polyethylene glycol (MIRALAX / GLYCOLAX) packet Take 17 g by mouth daily. Mix in 8 oz liquid and drink   Yes [provider]  potassium chloride SA (K-DUR,KLOR-CON) 20 MEQ tablet Take 2 tablets once a day. Patient taking differently: Take 40 mEq at bedtime by mouth.  03/04/17  Yes Mayo, Pete Pelt, MD  pregabalin (LYRICA) 150 MG capsule Take 1 capsule (150 mg total) by mouth 2 (two) times daily. 07/28/17  Yes Gerlene Fee, NP  torsemide (DEMADEX) 20 MG tablet Take 40 mg by mouth 2 (two) times daily.   Yes [provider]  trazodone (DESYREL) 300 MG tablet Take 1 tablet (300 mg total) by mouth at bedtime. 10/07/17  Yes Mayo, Pete Pelt, MD  venlafaxine XR (EFFEXOR-XR) 150 MG 24 hr capsule Take 300 mg by mouth daily.    Yes [provider]  cephALEXin (KEFLEX) 500 MG capsule Take 1 capsule (500 mg total) by mouth 4 (four) times daily. Patient not taking: Reported on 11/16/2017 11/06/17   Doristine Devoid, PA-C    Family History Family History  Problem Relation  Age of Onset  . Stroke Mother   . Dementia Father   . Prostate cancer Father   . Congestive Heart Failure Brother   . Diabetes Brother   . Hypertension Brother     Social History Social History   Tobacco Use  . Smoking status: Former Smoker    Packs/day: 1.00    Years: 30.00    Pack years: 30.00    Types: Cigarettes    Last attempt to quit: 01/05/2009    Years since quitting: 8.8  . Smokeless tobacco: Never Used  Substance Use Topics  . Alcohol use: No    Alcohol/week: 0.0 oz  . Drug use: No     Allergies   Aspirin   Review of Systems Review of Systems  Constitutional: Negative for chills and fever.  HENT: Negative.   Respiratory: Negative.   Cardiovascular: Negative.   Gastrointestinal: Positive for constipation. Negative for vomiting.  Musculoskeletal: Negative.   Skin: Negative.   Neurological: Negative.      Physical Exam Updated Vital Signs BP 115/64   Pulse 94   Temp 98.6 F (37 C) (Oral)   Resp 20   SpO2 90%   Physical Exam  Constitutional: She is oriented to person, place, and time. She appears well-developed and well-nourished.  HENT:  Head: Normocephalic.  Neck: Normal range of motion. Neck supple.  Cardiovascular: Normal rate, regular rhythm and intact distal pulses.  Pulmonary/Chest: Effort normal and breath sounds normal. She has no wheezes. She has no rales.  Abdominal: Soft. Bowel sounds are normal. There is no tenderness. There is no rebound and no guarding.  Morbidly obese abdomen, soft, not appreciably tender.   Genitourinary:  Genitourinary Comments: Rectum has a soft stool ball on exam. No ulcerations to buttocks. Stool is normal in color with visualized bleeding.   Musculoskeletal: Normal range of motion. She exhibits edema.  No warmth or significant or concerning erythema to suggest infection. Marked bilateral swelling and changes of venous stasis.   Neurological: She is alert and oriented to person, place, and time.  Skin:  Skin is warm and dry. No rash noted.  Psychiatric: She has a normal mood and affect.     ED Treatments / Results  Labs (all labs ordered are listed, but only abnormal results are displayed) Labs Reviewed - No data to display  EKG  EKG Interpretation None       Radiology No results found.  Procedures Procedures (including critical care time)  Medications Ordered in ED Medications - No data to display   Initial Impression / Assessment and Plan / ED Course  I have reviewed the triage vital signs and the nursing notes.  Pertinent labs & imaging results that were available during my care of the patient were reviewed by me and considered in my medical decision making (see chart for details).     Patient who is known to the emergency department presents with complain of constipation x 2 weeks. She states she is unable to get up to the bathroom and urinates on herself and is unable to clean herself.   Chart reviewed. Social Work has been involved and the process of nursing home placement has been started with her primary care doctor and social services. In-home care options have been exhausted.   On re-examination of the patient, when rolled to evaluate her complaint of rectal pain, pressure and the sense of straining for a bowel movement but being unable to pass any stool, the patient has more soft, brown stool outside the rectum. She continues to easily pass additional stool while being cleaned, totaling a very large bowel movement of soft stool.   There is no fever or vomiting. No existing constipation. She has been cleaned from being soiled from home. She can be discharged home and will contact her doctor Monday to get an update on placement status.  Final Clinical Impressions(s) / ED Diagnoses   Final diagnoses:  Constipation, unspecified constipation type    ED Discharge Orders    None       Charlann Lange,  PA-C 11/16/17 0559    Molpus, Jenny Reichmann, MD 11/16/17  [phone]

## 2017-11-17 ENCOUNTER — Other Ambulatory Visit: Payer: Self-pay | Admitting: *Deleted

## 2017-11-17 NOTE — Patient Outreach (Signed)
[facility]) Care Management  11/17/2017  Kathleen Roy Dec 14, 1961 [id number]   CSW made a second attempt to try and contact patient today to follow-up regarding long-term care placement arrangements for patient and her brother, Kathleen Roy; however, neither were available at the time of CSW's call.  A HIPAA compliant message was left for patient and Kathleen Roy on voicemail and CSW is currently awaiting a return call.  CSW will make a third and final outreach attempt within the next week, if a return call is not received from patient or Kathleen Roy in the meantime. CSW is aware that patient presented to the Emergency Department at [facility] over the weekend.  This makes a total of 9 Emergency Department visits for patient in the past 2 months.  Patient continues to refuse placement when she presents to the Emergency Department, reporting that she is not ready for a skilled facility to take her entire Social Security Disability check.  Patient is aware that she is out of skilled days for this fiscal year, not able to pay out-of-pocket for skilled nursing placement. Kathleen Roy, Social Worker with the [facility], continues to work on initiating long-term care placement for patient and Kathleen Roy in an assisted living facility.  Patient and Kathleen Roy FL-2 Form has been faxed to all assisted living facilities within a 50-mile radius, we are just awaiting bed offers.  Once received, they will be reviewed with patient and Kathleen Roy to further pursue placement.  CSW has been able to find an owner for patient's cat, Kathleen Roy. [name], [name], [name], [name]  Licensed Education officer, [facility]  Mailing [place] [address] [place], [place] [address] Physical Address-[address], [place], [place] [address] Toll Free Main # [phone] Fax # [phone] Cell # [phone]  Office #  [phone] [email or web address]

## 2017-11-18 ENCOUNTER — Encounter (HOSPITAL_COMMUNITY): Payer: Self-pay | Admitting: Emergency Medicine

## 2017-11-18 ENCOUNTER — Telehealth: Payer: Self-pay | Admitting: Licensed Clinical Social Worker

## 2017-11-18 ENCOUNTER — Emergency Department (HOSPITAL_COMMUNITY)
Admission: EM | Admit: 2017-11-18 | Discharge: 2017-11-19 | Disposition: A | Discharge status: Home / Self Care | Payer: Medicare Other | Attending: Emergency Medicine | Admitting: Emergency Medicine

## 2017-11-18 DIAGNOSIS — B372 Candidiasis of skin and nail: Secondary | ICD-10-CM | POA: Diagnosis not present

## 2017-11-18 DIAGNOSIS — Z79899 Other long term (current) drug therapy: Secondary | ICD-10-CM | POA: Diagnosis not present

## 2017-11-18 DIAGNOSIS — J449 Chronic obstructive pulmonary disease, unspecified: Secondary | ICD-10-CM | POA: Diagnosis not present

## 2017-11-18 DIAGNOSIS — R32 Unspecified urinary incontinence: Secondary | ICD-10-CM | POA: Diagnosis present

## 2017-11-18 DIAGNOSIS — I110 Hypertensive heart disease with heart failure: Secondary | ICD-10-CM | POA: Insufficient documentation

## 2017-11-18 DIAGNOSIS — Z87891 Personal history of nicotine dependence: Secondary | ICD-10-CM | POA: Diagnosis not present

## 2017-11-18 DIAGNOSIS — L8941 Pressure ulcer of contiguous site of back, buttock and hip, stage 1: Secondary | ICD-10-CM | POA: Insufficient documentation

## 2017-11-18 DIAGNOSIS — M79606 Pain in leg, unspecified: Secondary | ICD-10-CM | POA: Diagnosis not present

## 2017-11-18 DIAGNOSIS — R52 Pain, unspecified: Secondary | ICD-10-CM | POA: Diagnosis not present

## 2017-11-18 DIAGNOSIS — Z9981 Dependence on supplemental oxygen: Secondary | ICD-10-CM | POA: Diagnosis not present

## 2017-11-18 DIAGNOSIS — L304 Erythema intertrigo: Secondary | ICD-10-CM | POA: Diagnosis not present

## 2017-11-18 DIAGNOSIS — I5032 Chronic diastolic (congestive) heart failure: Secondary | ICD-10-CM | POA: Diagnosis not present

## 2017-11-18 DIAGNOSIS — L22 Diaper dermatitis: Secondary | ICD-10-CM | POA: Diagnosis not present

## 2017-11-18 MED ORDER — NYSTATIN 100000 UNIT/GM EX POWD
Freq: Two times a day (BID) | CUTANEOUS | Status: DC
  Administered 2017-11-19: 02:00:00 via TOPICAL
  Filled 2017-11-18: qty 15

## 2017-11-18 NOTE — Progress Notes (Signed)
Type of Service: Clinical Social Work  [name] returned phone call from patient, states she has not heard from [employer] social worker with an update on placement and wanted to know if [name] had informaiton.  The following was discussed :placement process ; is patient ready if bed officer is presented today or this week ; how to prepare for this transition; utilizing community support; deciding what to take and packing ; setting a deadline for having items packed.    [name] left message with DSS worker Natale Milch for an update on the placement.   Intervention: emotional support, , community resources , [place], Task Centered and motivational Interviewing. Patient becomes easily overwhelmed when thinking of all of the things she and her brother have to do to prepare to leave the home. She has excepted the fact the she and brother [name] will not be able to remain in their home. Reports they have started getting rid of things, however this is a long process.  Patient admits that she does not think she is ready to go to the facility today but is willing to go.  She was appreciative of information and return call.   Plan:  1. Patient will continue to work on identifying what she wants to take with her to the facility and getting rid of things.  2. [name] will wait for return call from [place] 3. [name] will F/U with patient in 3 to 5 days   Casimer Lanius, [name] Licensed Clinical Social Worker [place]   [phone] 2:58 PM

## 2017-11-18 NOTE — ED Provider Notes (Signed)
[facility] EMERGENCY DEPARTMENT Provider Note   CSN: [id number] Arrival date & time: 11/18/17  2202     History   Chief Complaint Chief Complaint  Patient presents with  . Urinary Incontinence  . Diaper Rash    HPI Kathleen Roy is a 56 y.o. female with a hx of morbid obesity, arthritis, CHF, COPD, fibromyalgia, hypertension, osteoarthritis, home oxygen usage, diabetes presents to the Emergency Department complaining of gradual, persistent, progressively worsening skin burning onset 2 days ago.  Patient reports that she is unable to get up without assistance and go to the bathroom therefore she has been sitting on wet incontinence pads for 3 days.  Patient lives at home with family but they are unable to assist with patient care.  Record review shows that patient was seen for a similar complaint on 11/16/2017.  At that time it was documented that social work has been involved and they are in the process of nursing home placement in conjunction with her primary care doctor.  At home options have been exhausted. Pt reports she has been unable to ambulate since Oct 11 when her Opana was discontinued and changed to flexeril and oxycodone.  She states this is due to both pain and weakness in her bilateral ankles.  She spent 4 weeks at a rehab facility but was discharged approx 3 weeks ago.  Pt has been cutting her torsemide dose in half in an effort to decrease her urination.  This has caused an increase in her leg swelling, but she is without CP or SOB.  Discussed with patient potential placement and she reports she is not yet ready to go to a facility.   The history is provided by the patient and medical records. No language interpreter was used.    Past Medical History:  Diagnosis Date  . Arthritis    "I feel like it's everywhere" (09/24/2017  . Cellulitis and abscess of lower extremity 08/16/2016   right leg  . CHF (congestive heart failure) ([place])   . Chronic lower back  pain   . COPD (chronic obstructive pulmonary disease) ([place])   . Depression   . Fibromyalgia   . Headache    "once q couple months now" (09/24/2017)  . Hyperlipidemia   . Hypertension   . HYPERTENSION, BENIGN SYSTEMIC 02/12/2007  . Memory disturbance   . Migraines 1970's - <2000   "they just went away"  . Neuropathy    "bad in my right hand and in both feet" (09/24/2017)  . Normal echocardiogram 05/30/05   suboptimal study  . Obesity, morbid (more than 100 lbs over ideal weight or BMI > 40) (HCC)    obese since childhood  . On home oxygen therapy    "4L; 24/7" (09/24/2017)  . Osteoarthritis   . Peripheral edema   . Pressure ulcer of foot    left  . Sepsis ([place])   . Shortness of breath dyspnea   . Sleep apnea 10/2014   "couldn't tolerate the mask" (09/24/2017)  . Type II diabetes mellitus [facility])     Patient Active Problem List   Diagnosis Date Noted  . Candidal dermatitis 09/25/2017  . Requires daily assistance for activities of daily living (ADL) and comfort needs   . Weakness 09/24/2017  . Other fatigue   . Altered mental status 09/20/2017  . 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 Allergic rhinitis 08/15/2017  . Immobility 08/15/2017  . Fibromyalgia 06/16/2017  . Chronic pain of both knees   .  Acute respiratory failure with hypercapnia ([place]) 10/15/2016  . Type 2 diabetes mellitus ([place]) 02/14/2016  . Chronic diastolic congestive heart failure ([place])   . Essential hypertension   . OSA (obstructive sleep apnea)   . Obesity hypoventilation syndrome ([place])   . Chronic respiratory failure with hypoxia ([place])   . Hyperglycemia 11/08/2014  . Memory loss 03/18/2014  . Venous stasis dermatitis of both lower extremities 12/02/2012  . COPD (chronic obstructive pulmonary disease) ([place]) 10/15/2011  . Peripheral neuropathy ([place]) 10/15/2011  . BACK PAIN, LUMBAR, CHRONIC 01/22/2011  . Morbid obesity with BMI of 70 and over, adult ([place]) 11/16/2007  . Major depressive disorder, recurrent episode  ([place]) 02/12/2007    Past Surgical History:  Procedure Laterality Date  . ABDOMINAL HERNIA REPAIR  2002  . APPENDECTOMY  1995  . CHOLECYSTECTOMY OPEN  1995  . HERNIA REPAIR    . TOTAL ABDOMINAL HYSTERECTOMY  1994    OB History    Gravida Para Term Preterm AB Living   0 0 0 0 0 0   SAB TAB Ectopic Multiple Live Births   0 0 0 0         Home Medications    Prior to Admission medications   Medication Sig Start Date End Date Taking? Authorizing Provider  ARIPiprazole (ABILIFY) 2 MG tablet Take 1 tablet (2 mg total) by mouth every other day. 12/18/16   Mayo, Pete Pelt, MD  Ascorbic Acid (VITAMIN C) 1000 MG tablet Take 3,000 mg daily by mouth.     [provider]  cephALEXin (KEFLEX) 500 MG capsule Take 1 capsule (500 mg total) by mouth 4 (four) times daily. Patient not taking: Reported on 11/16/2017 11/06/17   Ocie Cornfield T, PA-C  cetirizine (ZYRTEC) 10 MG tablet Take 1 tablet (10 mg total) by mouth daily. 08/15/17   Mayo, Pete Pelt, MD  diclofenac sodium (VOLTAREN) 1 % GEL Apply 2 g 4 (four) times daily topically. Patient taking differently: Apply 2 g topically 4 (four) times daily as needed (pain).  11/01/17   Langston Masker B, PA-C  fluticasone furoate-vilanterol (BREO ELLIPTA) 100-25 MCG/INH AEPB Inhale 1 puff daily into the lungs.    [provider]  Fluticasone-Salmeterol (ADVAIR) 100-50 MCG/DOSE AEPB Inhale 1 puff into the lungs 2 (two) times daily.    [provider]  nystatin (NYSTATIN) powder Apply 1 g topically daily as needed. Apply topically to affected area as needed for rash/itching     [provider]  Omega-3 Fatty Acids (FISH OIL) 1000 MG CAPS Take 2 capsules (2,000 mg total) by mouth at bedtime. 06/10/16   Mayo, Pete Pelt, MD  oxyCODONE-acetaminophen (PERCOCET) 10-325 MG tablet Take one tablet by mouth every 6 hours as needed for pain Patient taking differently: Take 1 tablet every 6 (six) hours as needed by mouth for pain.   09/23/17   Mayo, Pete Pelt, MD  OXYGEN Inhale 4 L into the lungs continuous.     [provider]  polyethylene glycol (MIRALAX / GLYCOLAX) packet Take 17 g by mouth daily. Mix in 8 oz liquid and drink    [provider]  potassium chloride SA (K-DUR,KLOR-CON) 20 MEQ tablet Take 2 tablets once a day. Patient taking differently: Take 40 mEq at bedtime by mouth.  03/04/17   Mayo, Pete Pelt, MD  pregabalin (LYRICA) 150 MG capsule Take 1 capsule (150 mg total) by mouth 2 (two) times daily. 07/28/17   Gerlene Fee, NP  torsemide (DEMADEX) 20 MG tablet  Take 40 mg by mouth 2 (two) times daily.    [provider]  trazodone (DESYREL) 300 MG tablet Take 1 tablet (300 mg total) by mouth at bedtime. 10/07/17   Mayo, Pete Pelt, MD  venlafaxine XR (EFFEXOR-XR) 150 MG 24 hr capsule Take 300 mg by mouth daily.     [provider]    Family History Family History  Problem Relation Age of Onset  . Stroke Mother   . Dementia Father   . Prostate cancer Father   . Congestive Heart Failure Brother   . Diabetes Brother   . Hypertension Brother     Social History Social History   Tobacco Use  . Smoking status: Former Smoker    Packs/day: 1.00    Years: 30.00    Pack years: 30.00    Types: Cigarettes    Last attempt to quit: 01/05/2009    Years since quitting: 8.8  . Smokeless tobacco: Never Used  Substance Use Topics  . Alcohol use: No    Alcohol/week: 0.0 oz  . Drug use: No     Allergies   Aspirin   Review of Systems Review of Systems  Constitutional: Negative for appetite change, diaphoresis, fatigue, fever and unexpected weight change.  HENT: Negative for mouth sores.   Eyes: Negative for visual disturbance.  Respiratory: Negative for cough, chest tightness, shortness of breath and wheezing.   Cardiovascular: Negative for chest pain.  Gastrointestinal: Negative for abdominal pain, constipation, diarrhea, nausea and vomiting.  Endocrine: Negative for  polydipsia, polyphagia and polyuria.  Genitourinary: Negative for dysuria, frequency, hematuria and urgency.  Musculoskeletal: Positive for arthralgias ( Bilateral ankles). Negative for back pain and neck stiffness.  Skin: Negative for rash.       Genital skin burning  Allergic/Immunologic: Negative for immunocompromised state.  Neurological: Negative for syncope, light-headedness and headaches.  Hematological: Does not bruise/bleed easily.  Psychiatric/Behavioral: Negative for sleep disturbance. The patient is not nervous/anxious.      Physical Exam Updated Vital Signs BP 114/67 (BP Location: Left Arm)   Pulse 85   Resp 16   SpO2 100%   Physical Exam  Constitutional: She appears well-developed and well-nourished. No distress.  Awake, alert, nontoxic appearance  HENT:  Head: Normocephalic and atraumatic.  Mouth/Throat: Oropharynx is clear and moist. No oropharyngeal exudate.  Eyes: Conjunctivae are normal. No scleral icterus.  Neck: Normal range of motion. Neck supple.  Cardiovascular: Normal rate, regular rhythm and intact distal pulses.  Pulmonary/Chest: Effort normal and breath sounds normal. No respiratory distress. She has no wheezes.  Equal chest expansion  Abdominal: Soft. Bowel sounds are normal. She exhibits no mass. There is no tenderness. There is no rebound and no guarding.  Morbidly obese, nontender Large pannus with evidence of Candida; no open wounds  Genitourinary: Pelvic exam was performed with patient supine. There is no rash or lesion on the right labia. There is rash on the left labia. There is no lesion on the left labia.  Genitourinary Comments: Stage I pressure ulcers and erythema to the bilateral buttocks and medial thighs; no open wounds  Musculoskeletal: Normal range of motion. She exhibits edema.  3+ pitting edema of the bilateral lower extremities with mild erythema and venous stasis changes but no increased warmth; no open wounds  Lymphadenopathy: No  inguinal adenopathy noted on the right or left side.  Neurological: She is alert.  Speech is clear and goal oriented Moves extremities without ataxia  Skin: Skin is warm and dry. She  is not diaphoretic.  Candida and skin breakdown in the intertrigo area and groin  Psychiatric: She has a normal mood and affect.  Nursing note and vitals reviewed.    ED Treatments / Results  Labs (all labs ordered are listed, but only abnormal results are displayed) Labs Reviewed  URINALYSIS, ROUTINE W REFLEX MICROSCOPIC     Procedures Procedures (including critical care time)  Medications Ordered in ED Medications  nystatin (MYCOSTATIN/NYSTOP) topical powder (not administered)  nystatin (MYCOSTATIN/NYSTOP) topical powder (not administered)     Initial Impression / Assessment and Plan / ED Course  I have reviewed the triage vital signs and the nursing notes.  Pertinent labs & imaging results that were available during my care of the patient were reviewed by me and considered in my medical decision making (see chart for details).  Clinical Course as of Nov 19 117  Wed Nov 19, 2017  0111 No evidence of UTI Leukocytes, UA: NEGATIVE [HM]    Clinical Course User Index [HM] Katrianna Friesenhahn, Jarrett [place], [place]    Patient presents with skin irritation secondary to sitting in her own feces and urine port she is not incontinent but cannot get up to go to the bathroom and therefore has been urinating on herself.  Patient reports the skin is burning but denies strict dysuria.  Will check UA.    Patient was cleaned and barrier cream applied.  Nystatin powder applied to areas with Candida.  Patient is unwilling to go to facility even if she was placed tonight.  She is outpatient resources attempting to place her into a skilled facility.  At this time I do not believe she requires admission.  She has no shortness of breath or chest pain.  No hypoxia and on home O2.  No evidence of UTI.    Long discussion about  need for placement and willingness to do so.  Pt states understanding. Rx for nystatin cream and instructions for PCP follow-up.  Also discussed reasons to return to the emergency department.    Final Clinical Impressions(s) / ED Diagnoses   Final diagnoses:  Candidal intertrigo  Pressure injury of contiguous region involving buttock and hip, stage 1, unspecified laterality    ED Discharge Orders    None       Loni Muse Gwenlyn Perking 11/19/17 0118    Quintella Reichert, MD 11/20/17 1302

## 2017-11-18 NOTE — ED Triage Notes (Signed)
Arrives via EMS with complaint of skin burning. Reports that she is unable to get to restroom and has been sitting on wet incontinence pads for 3 days. Lives at home, family is unable to assist patient with care. Seen at [facility] with similar issues 3 days ago. Per EMS APS is in process of finding placement for patient.

## 2017-11-19 ENCOUNTER — Telehealth: Payer: Self-pay | Admitting: Licensed Clinical Social Worker

## 2017-11-19 ENCOUNTER — Other Ambulatory Visit: Payer: Self-pay | Admitting: Internal Medicine

## 2017-11-19 LAB — URINALYSIS, ROUTINE W REFLEX MICROSCOPIC
BILIRUBIN URINE: NEGATIVE
GLUCOSE, UA: NEGATIVE mg/dL
HGB URINE DIPSTICK: NEGATIVE
Ketones, ur: NEGATIVE mg/dL
Leukocytes, UA: NEGATIVE
Nitrite: NEGATIVE
PH: 5 (ref 5.0–8.0)
Protein, ur: NEGATIVE mg/dL
SPECIFIC GRAVITY, URINE: 1.013 (ref 1.005–1.030)

## 2017-11-19 MED ORDER — NYSTATIN 100000 UNIT/GM EX POWD
Freq: Once | CUTANEOUS | Status: AC
  Administered 2017-11-19: 02:00:00 via TOPICAL
  Filled 2017-11-19: qty 15

## 2017-11-19 MED ORDER — NYSTATIN 100000 UNIT/GM EX CREA: TOPICAL_CREAM | CUTANEOUS | 0 refills | Status: AC

## 2017-11-19 NOTE — ED Notes (Signed)
[facility] called to transport pt home 

## 2017-11-19 NOTE — Discharge Instructions (Addendum)
1. Medications: nystatin cream, usual home medications including normal dose of torsemide 2. Treatment: keep areas as clean as possible  3. Follow Up: Please followup with your primary doctor, CSW and case manager in the morning for discussion of your diagnoses and status of your potential placement; if you do not have a primary care doctor use the resource guide provided to find one; Please return to the ER for fever, chills, abd pain, vomiting, open wounds, bleeding or other concerns

## 2017-11-19 NOTE — Progress Notes (Signed)
Type of Service: Clinical Social Work  [name] received phone call from patient, states she returned to ED last night due to not feeling well.  Was informed that her skin has started to breakdown [name] Informed patient DSS social worker Venia Minks is talking with two facilities [facility] and [facility] in [place] for placement.  Details have not been worked out but will Teacher, adult education to call patient. Patient is tearful and feeling overwhelmed.  She now has a since of urgency with the skin breakdown.  Patient appreciative of support.     Intervention: emotional support, solution focus strategies , Motivational Interviewing, relaxed breathing Plan:  1. Patient will continue to prepare for her transistion to the facility 2. [name] will assist DSS as needed with placement 3. [name] will continue to provide patient with emotional support  Casimer Lanius, [name] Licensed Clinical Social Worker [place]   [phone] 2:43 PM

## 2017-11-20 NOTE — Progress Notes (Addendum)
CSW received a call from [place] at that the pt is being assisted with placement at [facility] and [facility] in [facility] and that Neoma Laming is working with admissions at both facilities while facilitating this process.  Per Liane Comber at [place] [facility] this process is near completion should the pt present to the ED again.  Please reconsult if future social work needs arise.  CSW signing off, as social work intervention is no longer needed.  Alphonse Guild. Eain [place]endore, [name], LCAS, CSI Clinical Social Worker Ph: [phone]

## 2017-11-20 NOTE — Progress Notes (Signed)
Type of Service: Clinical Social Work  Patient call [name] several times today.  She is in distress with placement process, not being able to perform ADL, as well as leaving her brother and her cat.  Patient would like to go to facility the first of Jan however medical concerns related to skin breakdown will not allow her to wait that long.    DSS social worker Venia Minks continues to work on [place] placement for patient and brother she has contacted several facilities and has two pending offers.  Patient has pass due balance with several facility due to not paying her patient liability fee and has been declined at these facilities. [name] spoke with Freda Munro at [facility], an official bed offer has been made, they will need to order a bariatric bed for patient once patient accepts the offer.  Interventions: Emotional support, Motivational Interviewing; solution focus interventions Plan: Patient provide phone number to [facility], She will call Freda Munro when she is ready for them to order her bariatric bed.  [name] will F/U with patient in 3 to 5 days.  Casimer Lanius, [name] Licensed Clinical Social Worker [place]   [phone] 4:05 PM

## 2017-11-22 ENCOUNTER — Other Ambulatory Visit: Payer: Self-pay

## 2017-11-22 ENCOUNTER — Emergency Department (HOSPITAL_COMMUNITY)
Admission: EM | Admit: 2017-11-22 | Discharge: 2017-11-23 | Disposition: A | Discharge status: Home / Self Care | Payer: Medicare Other | Attending: Emergency Medicine | Admitting: Emergency Medicine

## 2017-11-22 ENCOUNTER — Encounter (HOSPITAL_COMMUNITY): Payer: Self-pay

## 2017-11-22 DIAGNOSIS — J449 Chronic obstructive pulmonary disease, unspecified: Secondary | ICD-10-CM | POA: Insufficient documentation

## 2017-11-22 DIAGNOSIS — I10 Essential (primary) hypertension: Secondary | ICD-10-CM | POA: Insufficient documentation

## 2017-11-22 DIAGNOSIS — Z87891 Personal history of nicotine dependence: Secondary | ICD-10-CM | POA: Diagnosis not present

## 2017-11-22 DIAGNOSIS — R531 Weakness: Secondary | ICD-10-CM | POA: Diagnosis not present

## 2017-11-22 DIAGNOSIS — Z79899 Other long term (current) drug therapy: Secondary | ICD-10-CM | POA: Insufficient documentation

## 2017-11-22 DIAGNOSIS — E119 Type 2 diabetes mellitus without complications: Secondary | ICD-10-CM | POA: Insufficient documentation

## 2017-11-22 DIAGNOSIS — I509 Heart failure, unspecified: Secondary | ICD-10-CM | POA: Diagnosis not present

## 2017-11-22 NOTE — ED Notes (Signed)
Bed: WA06 Expected date:  Expected time:  Means of arrival:  Comments: 58 f needs cleaned up

## 2017-11-22 NOTE — ED Provider Notes (Signed)
[facility] DEPT Provider Note   CSN: [id number] Arrival date & time: 11/22/17  2048     History   Chief Complaint Chief Complaint  Patient presents with  . Failure To Thrive    HPI IYONNA RISH is a 56 y.o. female.  HPI Patient is a 56 year old female who is nearly bedridden who presents the emergency department requesting to be clean.  She has been working with her primary care physician and is scheduled to be placed into a skilled nursing facility this week.  She has delayed her own placement at a skilled nursing facility as she has had a difficult time deciding she finally wanted to go.  She has no focus complaints at this time.  She requests to be clean and returned back to her house.   Past Medical History:  Diagnosis Date  . Arthritis    "I feel like it's everywhere" (09/24/2017  . Cellulitis and abscess of lower extremity 08/16/2016   right leg  . CHF (congestive heart failure) ([place])   . Chronic lower back pain   . COPD (chronic obstructive pulmonary disease) ([place])   . Depression   . Fibromyalgia   . Headache    "once q couple months now" (09/24/2017)  . Hyperlipidemia   . Hypertension   . HYPERTENSION, BENIGN SYSTEMIC 02/12/2007  . Memory disturbance   . Migraines 1970's - <2000   "they just went away"  . Neuropathy    "bad in my right hand and in both feet" (09/24/2017)  . Normal echocardiogram 05/30/05   suboptimal study  . Obesity, morbid (more than 100 lbs over ideal weight or BMI > 40) (HCC)    obese since childhood  . On home oxygen therapy    "4L; 24/7" (09/24/2017)  . Osteoarthritis   . Peripheral edema   . Pressure ulcer of foot    left  . Sepsis ([place])   . Shortness of breath dyspnea   . Sleep apnea 10/2014   "couldn't tolerate the mask" (09/24/2017)  . Type II diabetes mellitus [facility])     Patient Active Problem List   Diagnosis Date Noted  . Candidal dermatitis 09/25/2017  . Requires daily assistance for  activities of daily living (ADL) and comfort needs   . Weakness 09/24/2017  . Other fatigue   . Altered mental status 09/20/2017  . 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 Allergic rhinitis 08/15/2017  . Immobility 08/15/2017  . Fibromyalgia 06/16/2017  . Chronic pain of both knees   . Acute respiratory failure with hypercapnia ([place]) 10/15/2016  . Type 2 diabetes mellitus ([place]) 02/14/2016  . Chronic diastolic congestive heart failure ([place])   . Essential hypertension   . OSA (obstructive sleep apnea)   . Obesity hypoventilation syndrome ([place])   . Chronic respiratory failure with hypoxia ([place])   . Hyperglycemia 11/08/2014  . Memory loss 03/18/2014  . Venous stasis dermatitis of both lower extremities 12/02/2012  . COPD (chronic obstructive pulmonary disease) ([place]) 10/15/2011  . Peripheral neuropathy ([place]) 10/15/2011  . BACK PAIN, LUMBAR, CHRONIC 01/22/2011  . Morbid obesity with BMI of 70 and over, adult ([place]) 11/16/2007  . Major depressive disorder, recurrent episode ([place]) 02/12/2007    Past Surgical History:  Procedure Laterality Date  . ABDOMINAL HERNIA REPAIR  2002  . APPENDECTOMY  1995  . CHOLECYSTECTOMY OPEN  1995  . HERNIA REPAIR    . TOTAL ABDOMINAL HYSTERECTOMY  1994    OB History    Gravida Para Term Preterm  AB Living   0 0 0 0 0 0   SAB TAB Ectopic Multiple Live Births   0 0 0 0         Home Medications    Prior to Admission medications   Medication Sig Start Date End Date Taking? Authorizing Provider  ARIPiprazole (ABILIFY) 2 MG tablet Take 1 tablet (2 mg total) by mouth every other day. 12/18/16   Mayo, Pete Pelt, MD  Ascorbic Acid (VITAMIN C) 1000 MG tablet Take 3,000 mg daily by mouth.     [provider]  atorvastatin (LIPITOR) 10 MG tablet take 1 tablet by mouth at bedtime 11/21/17   Mayo, Pete Pelt, MD  cephALEXin (KEFLEX) 500 MG capsule Take 1 capsule (500 mg total) by mouth 4 (four) times daily. Patient not taking: Reported on 11/16/2017 11/06/17   Ocie Cornfield T, PA-C  cetirizine (ZYRTEC) 10 MG tablet Take 1 tablet (10 mg total) by mouth daily. 08/15/17   Mayo, Pete Pelt, MD  diclofenac sodium (VOLTAREN) 1 % GEL Apply 2 g 4 (four) times daily topically. Patient taking differently: Apply 2 g topically 4 (four) times daily as needed (pain).  11/01/17   Langston Masker B, PA-C  fluticasone furoate-vilanterol (BREO ELLIPTA) 100-25 MCG/INH AEPB Inhale 1 puff daily into the lungs.    [provider]  Fluticasone-Salmeterol (ADVAIR) 100-50 MCG/DOSE AEPB Inhale 1 puff into the lungs 2 (two) times daily.    [provider]  nystatin cream (MYCOSTATIN) Apply to affected area 2 times daily 11/19/17   Muthersbaugh, Jarrett Soho, PA-C  Omega-3 Fatty Acids (FISH OIL) 1000 MG CAPS Take 2 capsules (2,000 mg total) by mouth at bedtime. 06/10/16   Mayo, Pete Pelt, MD  oxyCODONE-acetaminophen (PERCOCET) 10-325 MG tablet Take one tablet by mouth every 6 hours as needed for pain Patient taking differently: Take 1 tablet every 6 (six) hours as needed by mouth for pain.  09/23/17   Mayo, Pete Pelt, MD  OXYGEN Inhale 4 L into the lungs continuous.     [provider]  polyethylene glycol (MIRALAX / GLYCOLAX) packet Take 17 g by mouth daily. Mix in 8 oz liquid and drink    [provider]  potassium chloride SA (K-DUR,KLOR-CON) 20 MEQ tablet Take 2 tablets once a day. Patient taking differently: Take 40 mEq at bedtime by mouth.  03/04/17   Mayo, Pete Pelt, MD  pregabalin (LYRICA) 150 MG capsule Take 1 capsule (150 mg total) by mouth 2 (two) times daily. 07/28/17   Gerlene Fee, NP  torsemide (DEMADEX) 20 MG tablet Take 40 mg by mouth 2 (two) times daily.    [provider]  trazodone (DESYREL) 300 MG tablet Take 1 tablet (300 mg total) by mouth at bedtime. 10/07/17   Mayo, Pete Pelt, MD  venlafaxine XR (EFFEXOR-XR) 150 MG 24 hr capsule Take 300 mg by mouth daily.     [provider]    Family History Family History    Problem Relation Age of Onset  . Stroke Mother   . Dementia Father   . Prostate cancer Father   . Congestive Heart Failure Brother   . Diabetes Brother   . Hypertension Brother     Social History Social History   Tobacco Use  . Smoking status: Former Smoker    Packs/day: 1.00    Years: 30.00    Pack years: 30.00    Types: Cigarettes    Last attempt to quit: 01/05/2009    Years since  quitting: 8.8  . Smokeless tobacco: Never Used  Substance Use Topics  . Alcohol use: No    Alcohol/week: 0.0 oz  . Drug use: No     Allergies   Aspirin   Review of Systems Review of Systems  All other systems reviewed and are negative.    Physical Exam Updated Vital Signs BP 129/79 (BP Location: Right Arm)   Pulse 70   Temp 98.9 F (37.2 C) (Oral)   Resp 18   SpO2 100%   Physical Exam  Constitutional: She is oriented to person, place, and time. She appears well-developed and well-nourished.  HENT:  Head: Normocephalic.  Eyes: EOM are normal.  Neck: Normal range of motion.  Cardiovascular: Normal rate.  Pulmonary/Chest: Effort normal.  Abdominal: She exhibits no distension.  Musculoskeletal:  Chronic venous stasis changes of her lower extremities.  Normal pulses in her bilateral feet.  No skin breakdown noted.  Neurological: She is alert and oriented to person, place, and time.  Psychiatric: She has a normal mood and affect.  Nursing note and vitals reviewed.    ED Treatments / Results  Labs (all labs ordered are listed, but only abnormal results are displayed) Labs Reviewed - No data to display  EKG  EKG Interpretation None       Radiology No results found.  Procedures Procedures (including critical care time)  Medications Ordered in ED Medications - No data to display   Initial Impression / Assessment and Plan / ED Course  I have reviewed the triage vital signs and the nursing notes.  Pertinent labs & imaging results that were available during my  care of the patient were reviewed by me and considered in my medical decision making (see chart for details).     Medical screening examination complete.  No life-threatening emergency.  Patient was cleaned by nursing staff.  She will return home at this time.  I have encouraged her to expedite her placement into a skilled nursing facility.  She understands return to the ER for new or worsening symptoms  Final Clinical Impressions(s) / ED Diagnoses   Final diagnoses:  Weakness    ED Discharge Orders    None       Jola Schmidt, MD 11/22/17 2306

## 2017-11-22 NOTE — ED Triage Notes (Addendum)
Pt comes from home c/o being covered in feces and urine x3 days. Requesting to be cleaned up. Pt A/Ox4. 4L/min [place].  EMS BP 136 palpated.

## 2017-11-23 ENCOUNTER — Other Ambulatory Visit: Payer: Self-pay

## 2017-11-23 DIAGNOSIS — R339 Retention of urine, unspecified: Secondary | ICD-10-CM | POA: Diagnosis not present

## 2017-11-23 DIAGNOSIS — J989 Respiratory disorder, unspecified: Secondary | ICD-10-CM | POA: Diagnosis not present

## 2017-11-23 NOTE — ED Notes (Signed)
PT DISCHARGED. INSTRUCTIONS GIVEN TO PTAR STAFF. AAOX4. PT IN NO APPARENT DISTRESS OR PAIN. THE OPPORTUNITY TO ASK QUESTIONS WAS PROVIDED.

## 2017-11-25 ENCOUNTER — Other Ambulatory Visit: Payer: Self-pay | Admitting: *Deleted

## 2017-11-25 DIAGNOSIS — J9611 Chronic respiratory failure with hypoxia: Secondary | ICD-10-CM | POA: Diagnosis not present

## 2017-11-25 NOTE — Patient Outreach (Signed)
[facility]) Care Management  11/25/2017  Kathleen Roy [birth year] [id number]   CSW was able to make contact with patient today to follow-up regarding long-term care placement arrangements for both patient and her brother, Laurisa Sahakian.  Patient reported that she and Mr. Abend have both accepted bed offers at [facility] [place] that offers long-term care services. Patient and Mr. Lobello are hopeful that they will be able to move to [facility] sometime this week, as patient would really like to avoid having to go back into the hospital or even visit the Emergency Department at [facility].  Fortunately, patient's landlord has agreed to remove all the items that patient and Mr. Gruenewald are unable to take with them to the facility.  In addition, patient was able to find a home for her cat, Joellen [place] through her veternary office. CSW offered counseling and supportive services, where appropriate, as patient became tearful when talking about leaving their current place of residence, most of their belongings, and Joellen Jersey.  Patient realizes that she is making the right decision for herself and Mr. Taormina, but reported, "It's still very hard".  CSW agreed to visit patient and Mr. Wiens at [facility] on Friday, December 05, 2017.  Patient's spirits were lifted and patient reported, "We will be looking forward to your visit". [facility] CM Care Plan Problem One     Most Recent Value  Care Plan Problem One  Patient is unable to perform activities of daily living independently.  Role Documenting the Problem One  Clinical Social Worker  Care Plan for Problem One  Active  [facility] Long Term Goal   Patient will receive skilled nursing placement for short-term rehabilitative services, within the next 45 days.  [facility] Long Term Goal Start Date  09/23/17  [facility] Long Term Goal Met Date  09/29/17  Interventions for Problem One Long Term Goal  CSW will assist patient with placement into a skilled nursing  facility for rehabiitative services.  [facility] CM Short Term Goal #1   CSW will request a completed and signed FL-2 Form from patient's primary care physician, within the next two weeks.  [facility] CM Short Term Goal #1 Start Date  09/23/17  [facility] CM Short Term Goal #1 Met Date  09/29/17  Interventions for Short Term Goal #1  CSW will pick up the completed and signed FL-2 Form from patient's primary care physician's office.  [facility] CM Short Term Goal #2   CSW will fax patient's completed and signed FL-2 Form to all facilities of choice, within the next two weeks.  [facility] CM Short Term Goal #2 Start Date  09/23/17  [facility] CM Short Term Goal #2 Met Date  09/29/17  Interventions for Short Term Goal #2  CSW will try and obtain bed offers for patient in skilled nursing settings.    [facility] CM Care Plan Problem Two     Most Recent Value  Care Plan Problem Two  Patient will require home care services in the home, upon discharge from the skilled nursing facility.  Role Documenting the Problem Two  Clinical Social Worker  Care Plan for Problem Two  Active  [facility] CM Short Term Goal #1   Patient will decide on a home health agency of choice, with regards to home care services, within the next 20 days.  [facility] CM Short Term Goal #1 Start Date  10/28/17  [facility] CM Short Term Goal #1 Met Date   11/03/17  Interventions for Short  Term Goal #2   CSW will assist patient and social worker at skilled nursing facility with arranging home care services for patient, prior to patient being discharged home.    [facility] CM Care Plan Problem Three     Most Recent Value  Care Plan Problem Three  Level of care issues.  Role Documenting the Problem Three  Clinical Social Worker  Care Plan for Problem Three  Active  [facility] CM Short Term Goal #1   Patient will receive home health services to assist with activities of daily living, within the next 30 days.  [facility] CM Short Term Goal #1 Start Date  11/04/17  [facility] CM Short Term Goal #1 Met Date  11/10/17   Interventions for Short Term Goal #1  Home health services have been arranged for patient and services will begin this week.    [name], [name], [name], [name]  Licensed Education officer, [facility]  Mailing [place] [address] [place], [place] [address] Physical Address-[address], [place], [place] [address] Toll Free Main # [phone] Fax # [phone] Cell # [phone]  Office # [phone] [email or web address]

## 2017-11-27 ENCOUNTER — Encounter (HOSPITAL_COMMUNITY): Payer: Self-pay | Admitting: Emergency Medicine

## 2017-11-27 ENCOUNTER — Telehealth: Payer: Self-pay | Admitting: Licensed Clinical Social Worker

## 2017-11-27 ENCOUNTER — Other Ambulatory Visit: Payer: Self-pay | Admitting: *Deleted

## 2017-11-27 ENCOUNTER — Emergency Department (HOSPITAL_COMMUNITY)
Admission: EM | Admit: 2017-11-27 | Discharge: 2017-11-27 | Disposition: A | Discharge status: Home / Self Care | Payer: Medicare Other | Attending: Emergency Medicine | Admitting: Emergency Medicine

## 2017-11-27 DIAGNOSIS — I10 Essential (primary) hypertension: Secondary | ICD-10-CM | POA: Diagnosis not present

## 2017-11-27 DIAGNOSIS — Z7409 Other reduced mobility: Secondary | ICD-10-CM | POA: Diagnosis not present

## 2017-11-27 DIAGNOSIS — I110 Hypertensive heart disease with heart failure: Secondary | ICD-10-CM | POA: Insufficient documentation

## 2017-11-27 DIAGNOSIS — Z87891 Personal history of nicotine dependence: Secondary | ICD-10-CM | POA: Diagnosis not present

## 2017-11-27 DIAGNOSIS — I5032 Chronic diastolic (congestive) heart failure: Secondary | ICD-10-CM | POA: Insufficient documentation

## 2017-11-27 DIAGNOSIS — Z79899 Other long term (current) drug therapy: Secondary | ICD-10-CM | POA: Insufficient documentation

## 2017-11-27 DIAGNOSIS — J449 Chronic obstructive pulmonary disease, unspecified: Secondary | ICD-10-CM | POA: Diagnosis not present

## 2017-11-27 DIAGNOSIS — R262 Difficulty in walking, not elsewhere classified: Secondary | ICD-10-CM | POA: Diagnosis not present

## 2017-11-27 DIAGNOSIS — E119 Type 2 diabetes mellitus without complications: Secondary | ICD-10-CM | POA: Insufficient documentation

## 2017-11-27 DIAGNOSIS — M549 Dorsalgia, unspecified: Secondary | ICD-10-CM | POA: Diagnosis not present

## 2017-11-27 DIAGNOSIS — M5489 Other dorsalgia: Secondary | ICD-10-CM | POA: Diagnosis not present

## 2017-11-27 NOTE — ED Triage Notes (Signed)
Per EMS pt is from home and complains of back pain.  She was discharged From [facility] this past Sunday and was placed in her chair.  She has been sitting in the chair since then and had approx. 5 BM.  She is AOx4 NAD noted at this time.

## 2017-11-27 NOTE — Progress Notes (Signed)
CSW spoke with pt. CSW informed pt that she is able go to  [facility] tomorrow. CSW stated once pt is up for discharge, she will be going home and picked up from home to go to [facility]. Pt expressed concerns to CSW about going to [facility]. Pt stated she is concerned about her cat, Katie. Pt and CSW discussed cat being cared for by her brother until brother comes to the home. Pt accepted that. Pt then stated she is concerned about not having a TV in her room at [facility]. Pt and CSW discussed utilizing the lounge areas for the TV. CSW stressed the importance of pt going to [facility]. Pt stated that she needs to provide her bills and rent to go to [place]. CSW explained that she spoke with Neoma Laming, from [facility], who stated that all of that has been taken care of.   CSW called Neoma Laming, from [facility], to update her about the pt.   Plan: CSW will check back in on pt prior to pt going home about [facility] bed.   Wendelyn Breslow, [facility]  [phone]

## 2017-11-27 NOTE — ED Provider Notes (Signed)
[facility] EMERGENCY DEPARTMENT Provider Note   CSN: [id number] Arrival date & time: 11/27/17  1637     History   Chief Complaint Chief Complaint  Patient presents with  . Back Pain  . Knee Pain    HPI Kathleen Roy is a 56 y.o. female.  HPI Patient is extremely deconditioned and has had failure of ADLs due to inability to walk or attend any of her own mobility.  I have reviewed EMR with extensive social work notes working towards patient placement due to this issue.  Ostensibly, patient is to go into SNF in 4 days.  Patient reports that due to her lack of mobility she has been unable to move out of her own urine and feces.  Reports it is even worse right now because she has had more diarrheal bowel movement than usual.  She reports typically she has been going to either [facility] or [facility] to get cleaned up before being returned home while sorting out the social issues of home health or SNF placement.  She denies there is been any other acute issues such as interim development of abdominal pain, fever, vomiting or other positives on review of systems. Past Medical History:  Diagnosis Date  . Arthritis    "I feel like it's everywhere" (09/24/2017  . Cellulitis and abscess of lower extremity 08/16/2016   right leg  . CHF (congestive heart failure) ([place])   . Chronic lower back pain   . COPD (chronic obstructive pulmonary disease) ([place])   . Depression   . Fibromyalgia   . Headache    "once q couple months now" (09/24/2017)  . Hyperlipidemia   . Hypertension   . HYPERTENSION, BENIGN SYSTEMIC 02/12/2007  . Memory disturbance   . Migraines 1970's - <2000   "they just went away"  . Neuropathy    "bad in my right hand and in both feet" (09/24/2017)  . Normal echocardiogram 05/30/05   suboptimal study  . Obesity, morbid (more than 100 lbs over ideal weight or BMI > 40) (HCC)    obese since childhood  . On home oxygen therapy    "4L; 24/7" (09/24/2017)  .  Osteoarthritis   . Peripheral edema   . Pressure ulcer of foot    left  . Sepsis ([place])   . Shortness of breath dyspnea   . Sleep apnea 10/2014   "couldn't tolerate the mask" (09/24/2017)  . Type II diabetes mellitus [facility])     Patient Active Problem List   Diagnosis Date Noted  . Candidal dermatitis 09/25/2017  . Requires daily assistance for activities of daily living (ADL) and comfort needs   . Weakness 09/24/2017  . Other fatigue   . Altered mental status 09/20/2017  . 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 Allergic rhinitis 08/15/2017  . Immobility 08/15/2017  . Fibromyalgia 06/16/2017  . Chronic pain of both knees   . Acute respiratory failure with hypercapnia ([place]) 10/15/2016  . Type 2 diabetes mellitus ([place]) 02/14/2016  . Chronic diastolic congestive heart failure ([place])   . Essential hypertension   . OSA (obstructive sleep apnea)   . Obesity hypoventilation syndrome ([place])   . Chronic respiratory failure with hypoxia ([place])   . Hyperglycemia 11/08/2014  . Memory loss 03/18/2014  . Venous stasis dermatitis of both lower extremities 12/02/2012  . COPD (chronic obstructive pulmonary disease) ([place]) 10/15/2011  . Peripheral neuropathy ([place]) 10/15/2011  . BACK PAIN, LUMBAR, CHRONIC 01/22/2011  . Morbid obesity with BMI of 70  and over, adult ([place]) 11/16/2007  . Major depressive disorder, recurrent episode ([place]) 02/12/2007    Past Surgical History:  Procedure Laterality Date  . ABDOMINAL HERNIA REPAIR  2002  . APPENDECTOMY  1995  . CHOLECYSTECTOMY OPEN  1995  . HERNIA REPAIR    . TOTAL ABDOMINAL HYSTERECTOMY  1994    OB History    Gravida Para Term Preterm AB Living   0 0 0 0 0 0   SAB TAB Ectopic Multiple Live Births   0 0 0 0         Home Medications    Prior to Admission medications   Medication Sig Start Date End Date Taking? Authorizing Provider  ARIPiprazole (ABILIFY) 2 MG tablet Take 1 tablet (2 mg total) by mouth every other day. 12/18/16   Mayo, Pete Pelt, MD    Ascorbic Acid (VITAMIN C) 1000 MG tablet Take 3,000 mg daily by mouth.     [provider]  atorvastatin (LIPITOR) 10 MG tablet take 1 tablet by mouth at bedtime 11/21/17   Mayo, Pete Pelt, MD  cephALEXin (KEFLEX) 500 MG capsule Take 1 capsule (500 mg total) by mouth 4 (four) times daily. Patient not taking: Reported on 11/16/2017 11/06/17   Ocie Cornfield T, PA-C  cetirizine (ZYRTEC) 10 MG tablet Take 1 tablet (10 mg total) by mouth daily. 08/15/17   Mayo, Pete Pelt, MD  diclofenac sodium (VOLTAREN) 1 % GEL Apply 2 g 4 (four) times daily topically. Patient taking differently: Apply 2 g topically 4 (four) times daily as needed (pain).  11/01/17   Langston Masker B, PA-C  fluticasone furoate-vilanterol (BREO ELLIPTA) 100-25 MCG/INH AEPB Inhale 1 puff daily into the lungs.    [provider]  Fluticasone-Salmeterol (ADVAIR) 100-50 MCG/DOSE AEPB Inhale 1 puff into the lungs 2 (two) times daily.    [provider]  nystatin cream (MYCOSTATIN) Apply to affected area 2 times daily 11/19/17   Muthersbaugh, Jarrett Soho, PA-C  Omega-3 Fatty Acids (FISH OIL) 1000 MG CAPS Take 2 capsules (2,000 mg total) by mouth at bedtime. 06/10/16   Mayo, Pete Pelt, MD  oxyCODONE-acetaminophen (PERCOCET) 10-325 MG tablet Take one tablet by mouth every 6 hours as needed for pain Patient taking differently: Take 1 tablet every 6 (six) hours as needed by mouth for pain.  09/23/17   Mayo, Pete Pelt, MD  OXYGEN Inhale 4 L into the lungs continuous.     [provider]  polyethylene glycol (MIRALAX / GLYCOLAX) packet Take 17 g by mouth daily. Mix in 8 oz liquid and drink    [provider]  potassium chloride SA (K-DUR,KLOR-CON) 20 MEQ tablet Take 2 tablets once a day. Patient taking differently: Take 40 mEq at bedtime by mouth.  03/04/17   Mayo, Pete Pelt, MD  pregabalin (LYRICA) 150 MG capsule Take 1 capsule (150 mg total) by mouth 2 (two) times daily. 07/28/17   Gerlene Fee, NP   torsemide (DEMADEX) 20 MG tablet Take 40 mg by mouth 2 (two) times daily.    [provider]  trazodone (DESYREL) 300 MG tablet Take 1 tablet (300 mg total) by mouth at bedtime. 10/07/17   Mayo, Pete Pelt, MD  venlafaxine XR (EFFEXOR-XR) 150 MG 24 hr capsule Take 300 mg by mouth daily.     [provider]    Family History Family History  Problem Relation Age of Onset  . Stroke Mother   . Dementia Father   . Prostate cancer Father   .  Congestive Heart Failure Brother   . Diabetes Brother   . Hypertension Brother     Social History Social History   Tobacco Use  . Smoking status: Former Smoker    Packs/day: 1.00    Years: 30.00    Pack years: 30.00    Types: Cigarettes    Last attempt to quit: 01/05/2009    Years since quitting: 8.8  . Smokeless tobacco: Never Used  Substance Use Topics  . Alcohol use: No    Alcohol/week: 0.0 oz  . Drug use: No     Allergies   Aspirin   Review of Systems Review of Systems 10 Systems reviewed and are negative for acute change except as noted in the HPI.   Physical Exam Updated Vital Signs BP (!) 120/91   Pulse 84   Temp 97.9 F (36.6 C) (Oral)   Resp 15   SpO2 100%   Physical Exam  Constitutional: She is oriented to person, place, and time.  Patient is morbidly obese and deconditioned.  She is alert and no acute distress.  No respiratory distress.  Patient has dried fecal matter on her lower extremities.  HENT:  Head: Normocephalic and atraumatic.  Mouth/Throat: Oropharynx is clear and moist.  Eyes: EOM are normal.  Cardiovascular: Normal rate, regular rhythm and normal heart sounds.  Pulmonary/Chest: Effort normal.  Patient does not have respiratory distress.  At this time, due to patient habitus and immobility chest auscultation is anterior and axillary.  No gross wheeze rhonchi or rale appreciated.  Abdominal: Soft. She exhibits no distension. There is no tenderness.  Patient's abdomen is morbidly  obese.  She does not endorse tenderness to palpation.  Musculoskeletal:  Patient has severe peripheral edema.  4+ bilateral feet and lower legs.  No appearance of acute cellulitis or open wounds.  Patient is very soiled with dried fecal matter.  Neurological: She is alert and oriented to person, place, and time. She exhibits normal muscle tone.  Skin: Skin is warm and dry.  Psychiatric: She has a normal mood and affect.     ED Treatments / Results  Labs (all labs ordered are listed, but only abnormal results are displayed) Labs Reviewed - No data to display  EKG  EKG Interpretation None       Radiology No results found.  Procedures Procedures (including critical care time)  Medications Ordered in ED Medications - No data to display   Initial Impression / Assessment and Plan / ED Course  I have reviewed the triage vital signs and the nursing notes.  Pertinent labs & imaging results that were available during my care of the patient were reviewed by me and considered in my medical decision making (see chart for details).      Final Clinical Impressions(s) / ED Diagnoses   Final diagnoses:  Impaired mobility and ADLs   Patient has no acute Patient has no acute complaints. She has been cleaned up by nursing staff.Social work advises she will be able to be picked up tomorrow in her home and taken to skilled nursing facility.  No apparent acute medical problems at this time.  ED Discharge Orders    None      Charlesetta Shanks, MD 11/27/17 2326

## 2017-11-27 NOTE — Progress Notes (Signed)
CSW received phone call from Loetta Rough, [phone]. Di Kindle informed CSW that pt has been accepted at [facility]. Shelia from [facility] informed Di Kindle that pt can go to [employer] [facility]. No time was provided, just that pt can go tomorrow.   Wendelyn Breslow, [facility]  [phone]

## 2017-11-27 NOTE — Discharge Instructions (Signed)
1.  Your arrangements are in place to be picked up tomorrow for nursing home placement. 2.  Contact your doctor or return to the emergency department if you have concerns for your health or medical care that change or increase.

## 2017-11-27 NOTE — Progress Notes (Signed)
Type of Service: Clinical Social Work  F/U call to patient to discuss progress of facility placement.  Patient continues to delay going to facility even thought everything is in place for her to go.  Patient contacted Freda Munro at [facility] today left message she would like to come to the facility Monday, patient waiting on return call from [place].  Patient is tearful and distressed, reports loose bowel movement "it's all over me and has covered my chair I need someone to clean me up".   Patient lives with her elderly brother who is unable to assist with her ADL's.  Called [facility] Social worker Di Kindle to discuss emergency options for patient's immediate need and continued coordination of care.  Home Care Providers unable to provide assistance until Monday. Discussed patient with CSW Director to explore other options.  Spoke to [place] with Alvis Lemmings unable to assist due to no skilled need.  Informed [name] to call [facility]. [name] left message with Logan Bores for an update on patient's placement process as patient informed [name] that she was going to the facility on Monday Dec. 17th .  DSS worker Venia Minks [phone] has verified that patient has LTC Medicaid. Ms. Laverta Baltimore has also left voice message with [facility].   [name] called patient, spoke to patient's brother, states patient was sitting in urine and feces they called EMS and patient is in route to the ED.  [name] spoke with ED Socia Worker Bernadene Bell and Roderic Palau to provide an update.   Plan:  [name] will continue to coordinate care and services with [facility] Social Worker, ED social workers and DSS Education officer, museum.   Casimer Lanius, [name] Licensed Clinical Social Worker [facility]   [phone] 4:24 PM

## 2017-11-27 NOTE — Patient Outreach (Addendum)
[facility] Care Management  11/27/2017  Kathleen Roy [birth year] [id number]   Call received from Kathleen Roy, Licensed Clinical Social Worker with the [facility], requesting assistance with arranging in-home care services for patient.  Patient contacted Kathleen Roy complaining of loose stools and incontinence.  Patient is completely wheelchair bound, unable to stand, let alone ambulate a few feet.  Kathleen Roy is aware that all home health agencies have refused to provide services to patient, as patient's home environment is deemed unsafe and patient is requiring a higher level of care than what they would be able to provide to her in the home.  CSW contacted an in-home care agency that has not been utilized by patient in the past, but they are unable to provide services until Monday, December 01, 2017, at which time, patient and her brother are scheduled to be placed at [facility] for long-term care placement arrangements.  CSW will plan to follow-up with patient within the next week to ensure that patient is placed and that the transition is smooth for them. Kathleen Roy, [name], [name], [name]  Licensed Education officer, environmental Health System  Mailing [place] N. [address], [place], [place] [address] Physical Address-[address], [place], [place] [address] Toll Free Main # [phone] Fax # [phone] Cell # [phone]  Office # [phone] [name]   Addendum: CSW noted that patient was transported by EMS (Emergency Medical Services) to [facility] Emergency Department with complaints of back pain.  During that time, CSW was able to converse with Violeta Gelinas, Admissions Coordinator with [facility], to arrange for patient to be admitted to [facility] tomorrow (Friday, November 27, 2017), as opposed to Monday, December 01, 2017. These arrangements  were made to try and prevent an Emergency Department visit. CSW also conversed with patient's brother to explain that patient will be placed at the skilled nursing facility tomorrow.  CSW requested that patient's brother, Kathleen Roy converse with patient about the importance of patient being placed in a skilled nursing facility as soon as there is a bed available, as patient is not safe to return home.  Patient is completely chair/bed bound, unable to perform activities of daily living.  Kathleen Roy is unable to assist patient with ADL's, as he is currently suffering from medical concerns.  Kathleen Roy voiced understanding, admitting his agreement with CSW. CSW has conversed with Kathleen Roy, Licensed Clinical Social Worker with the [facility], to provide an update.  CSW has also spoken with Kathleen Roy, Licensed Clinical Social Worker with the [facility] Emergency Department, to report findings.  CSW will contact Kathleen Roy at 8:30 AM on Friday morning (November 27, 2017) to discuss an admission time for patient.  Kathleen Roy assured CSW that she would have patient's bariatric bed delivered at 7:00 PM tonight.  Kathleen Roy further reported that she hopes to admit patient to their facility by 11:00 AM tomorrow morning. CSW will make arrangements for patient to be transported from her home to [facility], by [facility]) non-emergency ambulance transport, as soon as approval is given by Kathleen Roy.  CSW will make arrangements to visit patient at the skilled nursing facility to ensure that long-term care placement arrangements are in place.  CSW will also assist Kathleen Roy with arranging long-term care placement arrangements for Kathleen Roy at [facility]. Kathleen Roy, [name], [name], [employer] [facility]  Licensed Clinical Social Worker  [place]  Mailing [place] [address]. [address], [place], [place] [address] Physical  Address-[address], [place], [place] [address] Toll Free Main # [phone] Fax # [phone] Cell # [phone]  Office # [phone] [name]

## 2017-11-27 NOTE — Progress Notes (Signed)
CSW received phone call from [place], at [employer], [phone]. Di Kindle informed CSW that pt is on her way to the ED via EMS. Di Kindle stated that pt called EMS to be cleaned up and seek medical help if needed. Di Kindle informed CSW that she, along with Neoma Laming, CSW from [facility] and SW from [place], are working to have pt placed with [facility]. [facility] is working to have a bed delivered to [facility] today by 5:30. However, after speaking with Di Kindle and Neoma Laming there has been no answer from [facility] confirming if it is okay to deliver the bed today.   CSW spoke with Neoma Laming, [place], [phone]. Neoma Laming provided additional information about pt's attempted admission into [facility]. Neoma Laming explained she has not been able to connect with [facility]. From the pt, [facility] Paul[facility] Aus was willing to accept the pt next Monday when the bariatric bed is delivered, however Neoma Laming and this CSW have not been able to confirm that. CSW left voice message for [place] at [facility]. CSW has not received a response as of yet.   Neoma Laming confirmed that pt has [place] through [facility] and it has been verified by the pt's DSS worker.   Plan : Continue to attempt contact with [facility]. Fax out necessary paperwork for Long Term placement to [facility] via [employer]. Update Neoma Laming from [facility] with any updates.   Wendelyn Breslow, [facility]  [phone]

## 2017-11-28 ENCOUNTER — Telehealth: Payer: Self-pay | Admitting: Licensed Clinical Social Worker

## 2017-11-28 ENCOUNTER — Encounter: Payer: Self-pay | Admitting: *Deleted

## 2017-11-28 ENCOUNTER — Other Ambulatory Visit: Payer: Self-pay | Admitting: *Deleted

## 2017-11-28 DIAGNOSIS — M6249 Contracture of muscle, multiple sites: Secondary | ICD-10-CM | POA: Diagnosis not present

## 2017-11-28 DIAGNOSIS — J449 Chronic obstructive pulmonary disease, unspecified: Secondary | ICD-10-CM | POA: Diagnosis not present

## 2017-11-28 DIAGNOSIS — I5032 Chronic diastolic (congestive) heart failure: Secondary | ICD-10-CM | POA: Diagnosis not present

## 2017-11-28 NOTE — Progress Notes (Signed)
Service: Social Work F/U Call   F/U call to patient reference LTC facility placement at [facility] for today.  Patient has spoken to [place] at [facility] this AM.  She has called [facility]  and is currently waiting for transportation to the facility around 1:30pm.  Patient is tearful and stressed about making this transition, however she states she knows it is best.  Interventions: Supportive Counseling provided.  Update provided to Venia Minks, DSS APS Social Worker and patient's PCP.   Casimer Lanius, [name] Licensed Clinical Social Worker [facility]   [phone] 11:29 AM

## 2017-11-28 NOTE — Patient Outreach (Signed)
[facility]) Care Management  11/28/2017  Kathleen Roy Feb 16, 1961 [id number]   CSW was able to meet with patient today at [facility] where patient now resides to receive long-term care services, to ensure that patient arrived to the facility by [facility]).  CSW was able to deliver a television set to patient's room, per her request.  CSW will continue to work with Casimer Lanius, Licensed Clinical Social Worker with the [facility] to arrange for patient's brother to be placed at the same facility after the holiday's.  Patient was somewhat tearful about having to leave her home and her brother so CSW offered counseling and supportive services, where appropriate. CSW will perform a case closure on patient, as all goals of treatment have been met from social work standpoint and no additional social work needs have been identified at this time.  CSW  will fax an update to patient's Primary Care Physician, Dr. Hyman Bible to ensure that they are aware of CSW's involvement with patient's plan of care.  CSW will submit a case closure request to Alycia Rossetti, Care Management Assistant with [facility], in the form of an [employer].   [facility] CM Care Plan Problem One     Most Recent Value  Care Plan Problem One  Patient is unable to perform activities of daily living independently.  Role Documenting the Problem One  Clinical Social Worker  Care Plan for Problem One  Active  [facility] Long Term Goal   Patient will receive skilled nursing placement for short-term rehabilitative services, within the next 45 days.  [facility] Long Term Goal Start Date  09/23/17  [facility] Long Term Goal Met Date  09/29/17  Interventions for Problem One Long Term Goal  CSW will assist patient with placement into a skilled nursing facility for rehabiitative services.  [facility] CM Short Term Goal #1   CSW  will request a completed and signed FL-2 Form from patient's primary care physician, within the next two weeks.  [facility] CM Short Term Goal #1 Start Date  09/23/17  [facility] CM Short Term Goal #1 Met Date  09/29/17  Interventions for Short Term Goal #1  CSW will pick up the completed and signed FL-2 Form from patient's primary care physician's office.  [facility] CM Short Term Goal #2   CSW will fax patient's completed and signed FL-2 Form to all facilities of choice, within the next two weeks.  [facility] CM Short Term Goal #2 Start Date  09/23/17  [facility] CM Short Term Goal #2 Met Date  09/29/17  Interventions for Short Term Goal #2  CSW will try and obtain bed offers for patient in skilled nursing settings.    [facility] CM Care Plan Problem Two     Most Recent Value  Care Plan Problem Two  Patient will require home care services in the home, upon discharge from the skilled nursing facility.  Role Documenting the Problem Two  Clinical Social Worker  Care Plan for Problem Two  Active  [facility] CM Short Term Goal #1   Patient will decide on a home health agency of choice, with regards to home care services, within the next 20 days.  [facility] CM Short Term Goal #1 Start Date  10/28/17  [facility] CM Short Term Goal #1 Met Date   11/03/17  Interventions for Short Term Goal #2   CSW will assist patient  and Education officer, museum at skilled nursing facility with arranging home care services for patient, prior to patient being discharged home.    [facility] CM Care Plan Problem Three     Most Recent Value  Care Plan Problem Three  Level of care issues.  Role Documenting the Problem Three  Clinical Social Worker  Care Plan for Problem Three  Active  [facility] CM Short Term Goal #1   Patient will receive home health services to assist with activities of daily living, within the next 30 days.  [facility] CM Short Term Goal #1 Start Date  11/04/17  [facility] CM Short Term Goal #1 Met Date  11/10/17  Interventions for Short Term Goal #1  Home health services have been arranged for  patient and services will begin this week.     [name], [name], [name], [name]  Licensed Education officer, environmental Health System  Mailing [place] [address]. [address], [place], [place] [address] Physical Address-[address], [place], [place] [address] Toll Free Main # [phone] Fax # [phone] Cell # [phone]  Office # [phone] [email or web address]

## 2017-11-29 DIAGNOSIS — M6249 Contracture of muscle, multiple sites: Secondary | ICD-10-CM | POA: Diagnosis not present

## 2017-11-29 DIAGNOSIS — I5032 Chronic diastolic (congestive) heart failure: Secondary | ICD-10-CM | POA: Diagnosis not present

## 2017-12-01 DIAGNOSIS — I5032 Chronic diastolic (congestive) heart failure: Secondary | ICD-10-CM | POA: Diagnosis not present

## 2017-12-01 DIAGNOSIS — M6249 Contracture of muscle, multiple sites: Secondary | ICD-10-CM | POA: Diagnosis not present

## 2017-12-02 DIAGNOSIS — I5032 Chronic diastolic (congestive) heart failure: Secondary | ICD-10-CM | POA: Diagnosis not present

## 2017-12-02 DIAGNOSIS — M6249 Contracture of muscle, multiple sites: Secondary | ICD-10-CM | POA: Diagnosis not present

## 2017-12-03 DIAGNOSIS — M6249 Contracture of muscle, multiple sites: Secondary | ICD-10-CM | POA: Diagnosis not present

## 2017-12-03 DIAGNOSIS — I5032 Chronic diastolic (congestive) heart failure: Secondary | ICD-10-CM | POA: Diagnosis not present

## 2017-12-04 DIAGNOSIS — I509 Heart failure, unspecified: Secondary | ICD-10-CM | POA: Diagnosis not present

## 2017-12-04 DIAGNOSIS — I5032 Chronic diastolic (congestive) heart failure: Secondary | ICD-10-CM | POA: Diagnosis not present

## 2017-12-04 DIAGNOSIS — M6249 Contracture of muscle, multiple sites: Secondary | ICD-10-CM | POA: Diagnosis not present

## 2017-12-04 DIAGNOSIS — J9601 Acute respiratory failure with hypoxia: Secondary | ICD-10-CM | POA: Diagnosis not present

## 2017-12-04 DIAGNOSIS — G4733 Obstructive sleep apnea (adult) (pediatric): Secondary | ICD-10-CM | POA: Diagnosis not present

## 2017-12-05 ENCOUNTER — Ambulatory Visit: Payer: Self-pay | Admitting: *Deleted

## 2017-12-05 DIAGNOSIS — M6249 Contracture of muscle, multiple sites: Secondary | ICD-10-CM | POA: Diagnosis not present

## 2017-12-05 DIAGNOSIS — I5032 Chronic diastolic (congestive) heart failure: Secondary | ICD-10-CM | POA: Diagnosis not present

## 2017-12-06 DIAGNOSIS — I5032 Chronic diastolic (congestive) heart failure: Secondary | ICD-10-CM | POA: Diagnosis not present

## 2017-12-06 DIAGNOSIS — M6249 Contracture of muscle, multiple sites: Secondary | ICD-10-CM | POA: Diagnosis not present

## 2017-12-07 DIAGNOSIS — M6249 Contracture of muscle, multiple sites: Secondary | ICD-10-CM | POA: Diagnosis not present

## 2017-12-07 DIAGNOSIS — I5032 Chronic diastolic (congestive) heart failure: Secondary | ICD-10-CM | POA: Diagnosis not present

## 2017-12-08 DIAGNOSIS — I5032 Chronic diastolic (congestive) heart failure: Secondary | ICD-10-CM | POA: Diagnosis not present

## 2017-12-08 DIAGNOSIS — M6249 Contracture of muscle, multiple sites: Secondary | ICD-10-CM | POA: Diagnosis not present

## 2017-12-10 DIAGNOSIS — I5032 Chronic diastolic (congestive) heart failure: Secondary | ICD-10-CM | POA: Diagnosis not present

## 2017-12-10 DIAGNOSIS — M6249 Contracture of muscle, multiple sites: Secondary | ICD-10-CM | POA: Diagnosis not present

## 2017-12-11 DIAGNOSIS — M6249 Contracture of muscle, multiple sites: Secondary | ICD-10-CM | POA: Diagnosis not present

## 2017-12-11 DIAGNOSIS — I5032 Chronic diastolic (congestive) heart failure: Secondary | ICD-10-CM | POA: Diagnosis not present

## 2017-12-12 DIAGNOSIS — M6249 Contracture of muscle, multiple sites: Secondary | ICD-10-CM | POA: Diagnosis not present

## 2017-12-12 DIAGNOSIS — I5032 Chronic diastolic (congestive) heart failure: Secondary | ICD-10-CM | POA: Diagnosis not present

## 2017-12-15 DIAGNOSIS — M6249 Contracture of muscle, multiple sites: Secondary | ICD-10-CM | POA: Diagnosis not present

## 2017-12-15 DIAGNOSIS — I5032 Chronic diastolic (congestive) heart failure: Secondary | ICD-10-CM | POA: Diagnosis not present

## 2017-12-16 DIAGNOSIS — M6249 Contracture of muscle, multiple sites: Secondary | ICD-10-CM | POA: Diagnosis not present

## 2017-12-16 DIAGNOSIS — R41841 Cognitive communication deficit: Secondary | ICD-10-CM | POA: Diagnosis not present

## 2017-12-16 DIAGNOSIS — I5032 Chronic diastolic (congestive) heart failure: Secondary | ICD-10-CM | POA: Diagnosis not present

## 2017-12-17 DIAGNOSIS — M6249 Contracture of muscle, multiple sites: Secondary | ICD-10-CM | POA: Diagnosis not present

## 2017-12-17 DIAGNOSIS — R41841 Cognitive communication deficit: Secondary | ICD-10-CM | POA: Diagnosis not present

## 2017-12-17 DIAGNOSIS — I5032 Chronic diastolic (congestive) heart failure: Secondary | ICD-10-CM | POA: Diagnosis not present

## 2017-12-18 DIAGNOSIS — M6249 Contracture of muscle, multiple sites: Secondary | ICD-10-CM | POA: Diagnosis not present

## 2017-12-18 DIAGNOSIS — I5032 Chronic diastolic (congestive) heart failure: Secondary | ICD-10-CM | POA: Diagnosis not present

## 2017-12-18 DIAGNOSIS — R41841 Cognitive communication deficit: Secondary | ICD-10-CM | POA: Diagnosis not present

## 2017-12-19 DIAGNOSIS — R41841 Cognitive communication deficit: Secondary | ICD-10-CM | POA: Diagnosis not present

## 2017-12-19 DIAGNOSIS — M6249 Contracture of muscle, multiple sites: Secondary | ICD-10-CM | POA: Diagnosis not present

## 2017-12-19 DIAGNOSIS — I5032 Chronic diastolic (congestive) heart failure: Secondary | ICD-10-CM | POA: Diagnosis not present

## 2017-12-22 DIAGNOSIS — M6249 Contracture of muscle, multiple sites: Secondary | ICD-10-CM | POA: Diagnosis not present

## 2017-12-22 DIAGNOSIS — I5032 Chronic diastolic (congestive) heart failure: Secondary | ICD-10-CM | POA: Diagnosis not present

## 2017-12-22 DIAGNOSIS — R41841 Cognitive communication deficit: Secondary | ICD-10-CM | POA: Diagnosis not present

## 2017-12-23 DIAGNOSIS — M6249 Contracture of muscle, multiple sites: Secondary | ICD-10-CM | POA: Diagnosis not present

## 2017-12-23 DIAGNOSIS — I5032 Chronic diastolic (congestive) heart failure: Secondary | ICD-10-CM | POA: Diagnosis not present

## 2017-12-23 DIAGNOSIS — R41841 Cognitive communication deficit: Secondary | ICD-10-CM | POA: Diagnosis not present

## 2017-12-24 DIAGNOSIS — R41841 Cognitive communication deficit: Secondary | ICD-10-CM | POA: Diagnosis not present

## 2017-12-24 DIAGNOSIS — I5032 Chronic diastolic (congestive) heart failure: Secondary | ICD-10-CM | POA: Diagnosis not present

## 2017-12-24 DIAGNOSIS — M6249 Contracture of muscle, multiple sites: Secondary | ICD-10-CM | POA: Diagnosis not present

## 2017-12-25 DIAGNOSIS — I5032 Chronic diastolic (congestive) heart failure: Secondary | ICD-10-CM | POA: Diagnosis not present

## 2017-12-25 DIAGNOSIS — G8929 Other chronic pain: Secondary | ICD-10-CM | POA: Diagnosis not present

## 2017-12-25 DIAGNOSIS — M6249 Contracture of muscle, multiple sites: Secondary | ICD-10-CM | POA: Diagnosis not present

## 2017-12-25 DIAGNOSIS — J449 Chronic obstructive pulmonary disease, unspecified: Secondary | ICD-10-CM | POA: Diagnosis not present

## 2017-12-25 DIAGNOSIS — R41841 Cognitive communication deficit: Secondary | ICD-10-CM | POA: Diagnosis not present

## 2017-12-25 DIAGNOSIS — F0390 Unspecified dementia without behavioral disturbance: Secondary | ICD-10-CM | POA: Diagnosis not present

## 2017-12-26 DIAGNOSIS — R41841 Cognitive communication deficit: Secondary | ICD-10-CM | POA: Diagnosis not present

## 2017-12-26 DIAGNOSIS — I5032 Chronic diastolic (congestive) heart failure: Secondary | ICD-10-CM | POA: Diagnosis not present

## 2017-12-26 DIAGNOSIS — M6249 Contracture of muscle, multiple sites: Secondary | ICD-10-CM | POA: Diagnosis not present

## 2017-12-29 DIAGNOSIS — R41841 Cognitive communication deficit: Secondary | ICD-10-CM | POA: Diagnosis not present

## 2017-12-29 DIAGNOSIS — M6249 Contracture of muscle, multiple sites: Secondary | ICD-10-CM | POA: Diagnosis not present

## 2017-12-29 DIAGNOSIS — I5032 Chronic diastolic (congestive) heart failure: Secondary | ICD-10-CM | POA: Diagnosis not present

## 2017-12-30 DIAGNOSIS — I5032 Chronic diastolic (congestive) heart failure: Secondary | ICD-10-CM | POA: Diagnosis not present

## 2017-12-30 DIAGNOSIS — M6249 Contracture of muscle, multiple sites: Secondary | ICD-10-CM | POA: Diagnosis not present

## 2017-12-30 DIAGNOSIS — R41841 Cognitive communication deficit: Secondary | ICD-10-CM | POA: Diagnosis not present

## 2017-12-31 DIAGNOSIS — M6249 Contracture of muscle, multiple sites: Secondary | ICD-10-CM | POA: Diagnosis not present

## 2017-12-31 DIAGNOSIS — I5032 Chronic diastolic (congestive) heart failure: Secondary | ICD-10-CM | POA: Diagnosis not present

## 2017-12-31 DIAGNOSIS — R41841 Cognitive communication deficit: Secondary | ICD-10-CM | POA: Diagnosis not present

## 2018-01-01 DIAGNOSIS — G8929 Other chronic pain: Secondary | ICD-10-CM | POA: Diagnosis not present

## 2018-01-01 DIAGNOSIS — J449 Chronic obstructive pulmonary disease, unspecified: Secondary | ICD-10-CM | POA: Diagnosis not present

## 2018-01-01 DIAGNOSIS — R41841 Cognitive communication deficit: Secondary | ICD-10-CM | POA: Diagnosis not present

## 2018-01-01 DIAGNOSIS — M6249 Contracture of muscle, multiple sites: Secondary | ICD-10-CM | POA: Diagnosis not present

## 2018-01-01 DIAGNOSIS — I5032 Chronic diastolic (congestive) heart failure: Secondary | ICD-10-CM | POA: Diagnosis not present

## 2018-01-01 DIAGNOSIS — J9690 Respiratory failure, unspecified, unspecified whether with hypoxia or hypercapnia: Secondary | ICD-10-CM | POA: Diagnosis not present

## 2018-01-01 DIAGNOSIS — F0390 Unspecified dementia without behavioral disturbance: Secondary | ICD-10-CM | POA: Diagnosis not present

## 2018-01-02 DIAGNOSIS — M6249 Contracture of muscle, multiple sites: Secondary | ICD-10-CM | POA: Diagnosis not present

## 2018-01-02 DIAGNOSIS — I5032 Chronic diastolic (congestive) heart failure: Secondary | ICD-10-CM | POA: Diagnosis not present

## 2018-01-02 DIAGNOSIS — R41841 Cognitive communication deficit: Secondary | ICD-10-CM | POA: Diagnosis not present

## 2018-01-03 DIAGNOSIS — R41841 Cognitive communication deficit: Secondary | ICD-10-CM | POA: Diagnosis not present

## 2018-01-03 DIAGNOSIS — M6249 Contracture of muscle, multiple sites: Secondary | ICD-10-CM | POA: Diagnosis not present

## 2018-01-03 DIAGNOSIS — I5032 Chronic diastolic (congestive) heart failure: Secondary | ICD-10-CM | POA: Diagnosis not present

## 2018-01-05 DIAGNOSIS — R41841 Cognitive communication deficit: Secondary | ICD-10-CM | POA: Diagnosis not present

## 2018-01-05 DIAGNOSIS — I5032 Chronic diastolic (congestive) heart failure: Secondary | ICD-10-CM | POA: Diagnosis not present

## 2018-01-05 DIAGNOSIS — M6249 Contracture of muscle, multiple sites: Secondary | ICD-10-CM | POA: Diagnosis not present

## 2018-01-06 DIAGNOSIS — M6249 Contracture of muscle, multiple sites: Secondary | ICD-10-CM | POA: Diagnosis not present

## 2018-01-06 DIAGNOSIS — I5032 Chronic diastolic (congestive) heart failure: Secondary | ICD-10-CM | POA: Diagnosis not present

## 2018-01-06 DIAGNOSIS — R41841 Cognitive communication deficit: Secondary | ICD-10-CM | POA: Diagnosis not present

## 2018-01-07 DIAGNOSIS — R41841 Cognitive communication deficit: Secondary | ICD-10-CM | POA: Diagnosis not present

## 2018-01-07 DIAGNOSIS — I5032 Chronic diastolic (congestive) heart failure: Secondary | ICD-10-CM | POA: Diagnosis not present

## 2018-01-07 DIAGNOSIS — M6249 Contracture of muscle, multiple sites: Secondary | ICD-10-CM | POA: Diagnosis not present

## 2018-01-08 ENCOUNTER — Telehealth: Payer: Self-pay | Admitting: Licensed Clinical Social Worker

## 2018-01-08 DIAGNOSIS — R41841 Cognitive communication deficit: Secondary | ICD-10-CM | POA: Diagnosis not present

## 2018-01-08 DIAGNOSIS — I5032 Chronic diastolic (congestive) heart failure: Secondary | ICD-10-CM | POA: Diagnosis not present

## 2018-01-08 DIAGNOSIS — M6249 Contracture of muscle, multiple sites: Secondary | ICD-10-CM | POA: Diagnosis not present

## 2018-01-08 NOTE — Progress Notes (Signed)
Type of Service: Clinical Social Work  [name] receive phone call from patient, states she is trying to adjust to the nursing home but it has been difficult due to her roommate. Patient as been at facility about 30 days. Brother [name] is also at the facility and comes to see her daily.   Intervention: emotional support and  Supportive Counseling provided.  Casimer Lanius, [name] Licensed Clinical Social Worker [place]   [phone] 10:52 AM

## 2018-01-09 DIAGNOSIS — M6249 Contracture of muscle, multiple sites: Secondary | ICD-10-CM | POA: Diagnosis not present

## 2018-01-09 DIAGNOSIS — R41841 Cognitive communication deficit: Secondary | ICD-10-CM | POA: Diagnosis not present

## 2018-01-09 DIAGNOSIS — I5032 Chronic diastolic (congestive) heart failure: Secondary | ICD-10-CM | POA: Diagnosis not present

## 2018-01-12 DIAGNOSIS — M6249 Contracture of muscle, multiple sites: Secondary | ICD-10-CM | POA: Diagnosis not present

## 2018-01-12 DIAGNOSIS — R41841 Cognitive communication deficit: Secondary | ICD-10-CM | POA: Diagnosis not present

## 2018-01-12 DIAGNOSIS — I5032 Chronic diastolic (congestive) heart failure: Secondary | ICD-10-CM | POA: Diagnosis not present

## 2018-01-13 DIAGNOSIS — I5032 Chronic diastolic (congestive) heart failure: Secondary | ICD-10-CM | POA: Diagnosis not present

## 2018-01-13 DIAGNOSIS — M6249 Contracture of muscle, multiple sites: Secondary | ICD-10-CM | POA: Diagnosis not present

## 2018-01-13 DIAGNOSIS — R41841 Cognitive communication deficit: Secondary | ICD-10-CM | POA: Diagnosis not present

## 2018-01-14 DIAGNOSIS — R41841 Cognitive communication deficit: Secondary | ICD-10-CM | POA: Diagnosis not present

## 2018-01-14 DIAGNOSIS — M6249 Contracture of muscle, multiple sites: Secondary | ICD-10-CM | POA: Diagnosis not present

## 2018-01-14 DIAGNOSIS — I5032 Chronic diastolic (congestive) heart failure: Secondary | ICD-10-CM | POA: Diagnosis not present

## 2018-01-15 DIAGNOSIS — M6249 Contracture of muscle, multiple sites: Secondary | ICD-10-CM | POA: Diagnosis not present

## 2018-01-15 DIAGNOSIS — I5032 Chronic diastolic (congestive) heart failure: Secondary | ICD-10-CM | POA: Diagnosis not present

## 2018-01-15 DIAGNOSIS — R41841 Cognitive communication deficit: Secondary | ICD-10-CM | POA: Diagnosis not present

## 2018-01-29 DIAGNOSIS — E1140 Type 2 diabetes mellitus with diabetic neuropathy, unspecified: Secondary | ICD-10-CM | POA: Diagnosis not present

## 2018-01-29 DIAGNOSIS — I509 Heart failure, unspecified: Secondary | ICD-10-CM | POA: Diagnosis not present

## 2018-01-29 DIAGNOSIS — G8929 Other chronic pain: Secondary | ICD-10-CM | POA: Diagnosis not present

## 2018-01-29 DIAGNOSIS — F322 Major depressive disorder, single episode, severe without psychotic features: Secondary | ICD-10-CM | POA: Diagnosis not present

## 2018-01-29 DIAGNOSIS — R21 Rash and other nonspecific skin eruption: Secondary | ICD-10-CM | POA: Diagnosis not present

## 2018-01-29 DIAGNOSIS — J449 Chronic obstructive pulmonary disease, unspecified: Secondary | ICD-10-CM | POA: Diagnosis not present

## 2018-01-29 DIAGNOSIS — G4733 Obstructive sleep apnea (adult) (pediatric): Secondary | ICD-10-CM | POA: Diagnosis not present

## 2018-01-29 DIAGNOSIS — F0390 Unspecified dementia without behavioral disturbance: Secondary | ICD-10-CM | POA: Diagnosis not present

## 2018-01-29 DIAGNOSIS — J9611 Chronic respiratory failure with hypoxia: Secondary | ICD-10-CM | POA: Diagnosis not present

## 2018-02-08 IMAGING — DX DG ANKLE COMPLETE 3+V*L*
3 series · 3 of 3 positions shown · non-contrast
Comparison: 02/15/2015

CLINICAL DATA: Difficulty ambulating.  Ankle pain.

EXAM:
LEFT ANKLE COMPLETE - 3+ VIEW

[x ankle lat left]
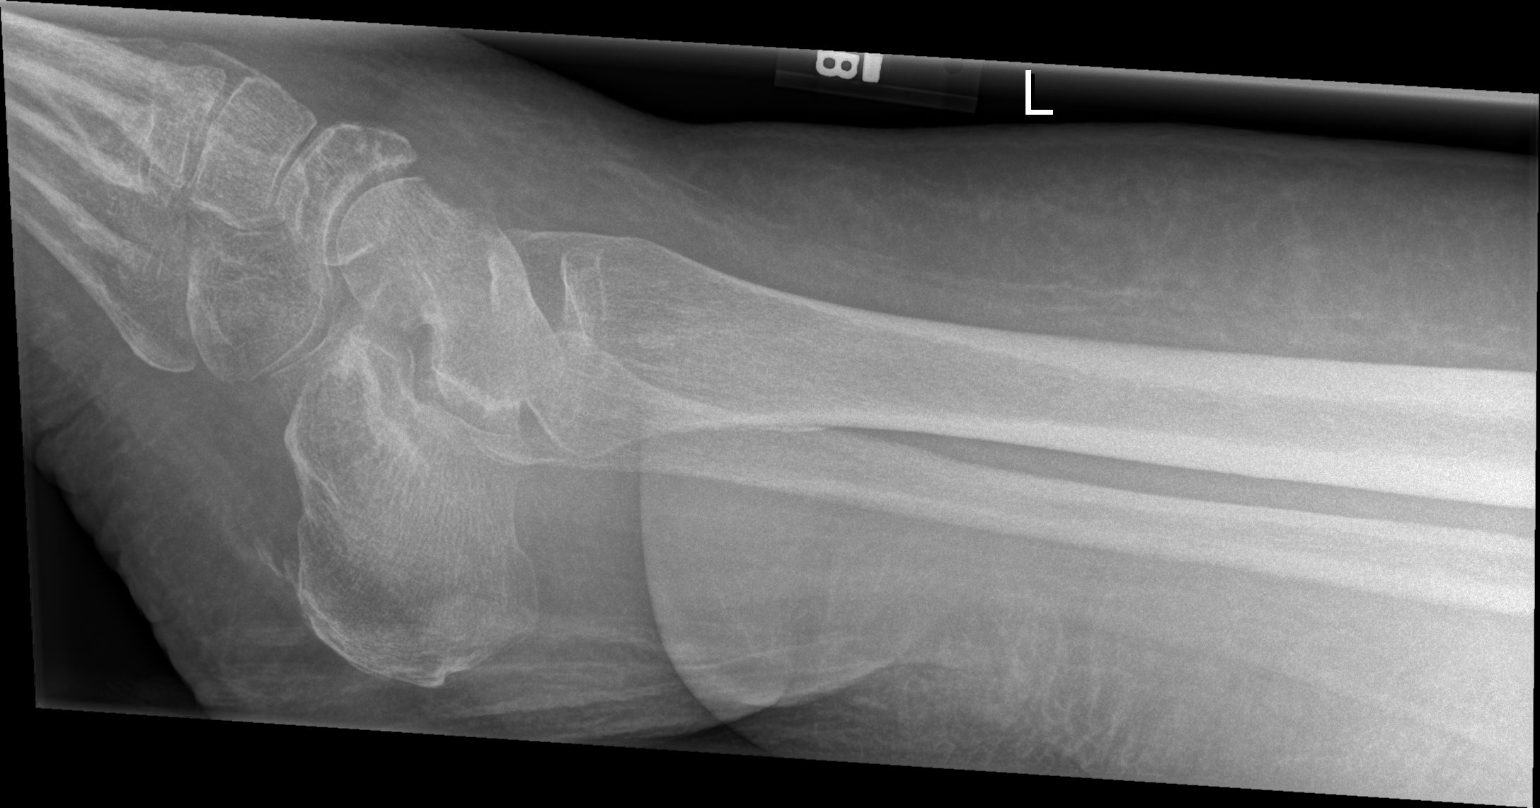

[x ankle ap left]
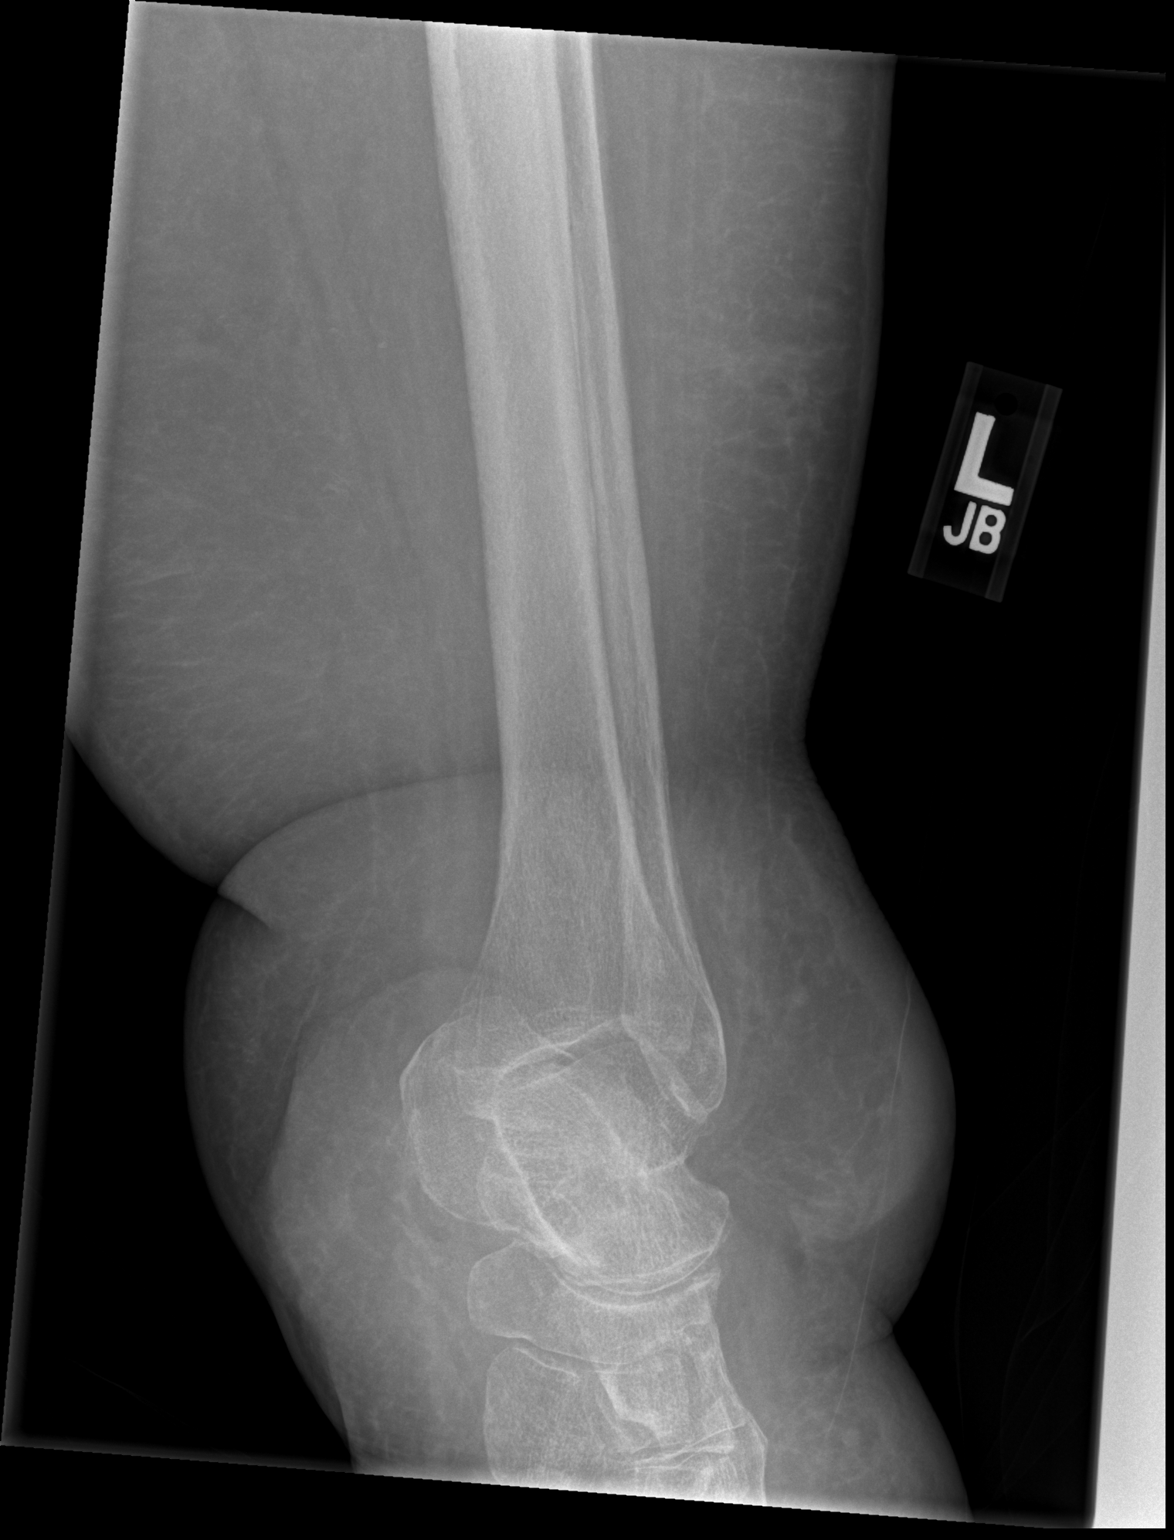

[x ankle obl left]
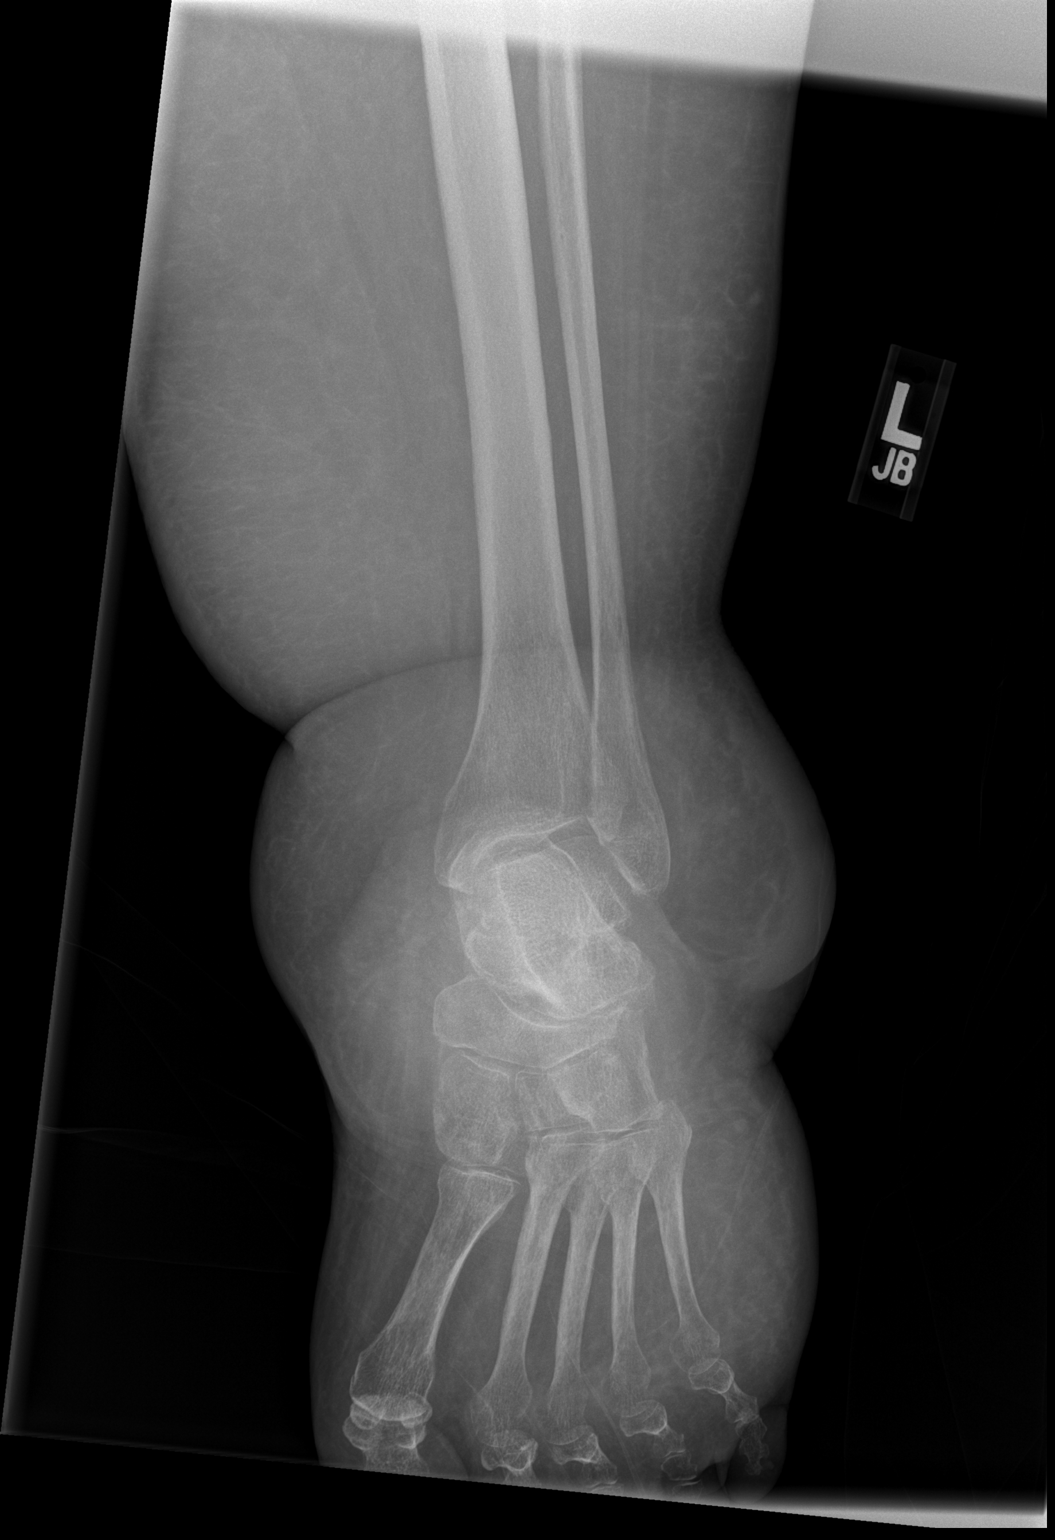

[3 of 3 positions shown; findings below may reference images not displayed]

FINDINGS: No acute fracture nor joint dislocations. Enlarged body habitus with
soft tissue thickening over the lateral malleolus. The bones are
slightly demineralized in appearance. There are subchondral
degenerate cystic change and sclerosis involving the mid foot more
so the navicular. Calcifications are seen along plantar aponeurosis
possibly from chronic fasciitis.
IMPRESSION: 1. Soft tissue calcifications along the plantar fascia that may
reflect stigmata of chronic fasciitis.
2. Mild osteopenia likely from disuse. Mild osteoarthritic change of
the included ankle and foot.

## 2018-02-08 IMAGING — DX DG ANKLE COMPLETE 3+V*R*
3 series · 3 of 3 positions shown · non-contrast
Comparison: Foot radiographs from 11/16/2007

CLINICAL DATA: Ankle pain.  Difficulty moving without assistance.

EXAM:
RIGHT ANKLE - COMPLETE 3+ VIEW

[x ankle lat right (1 of 2)]
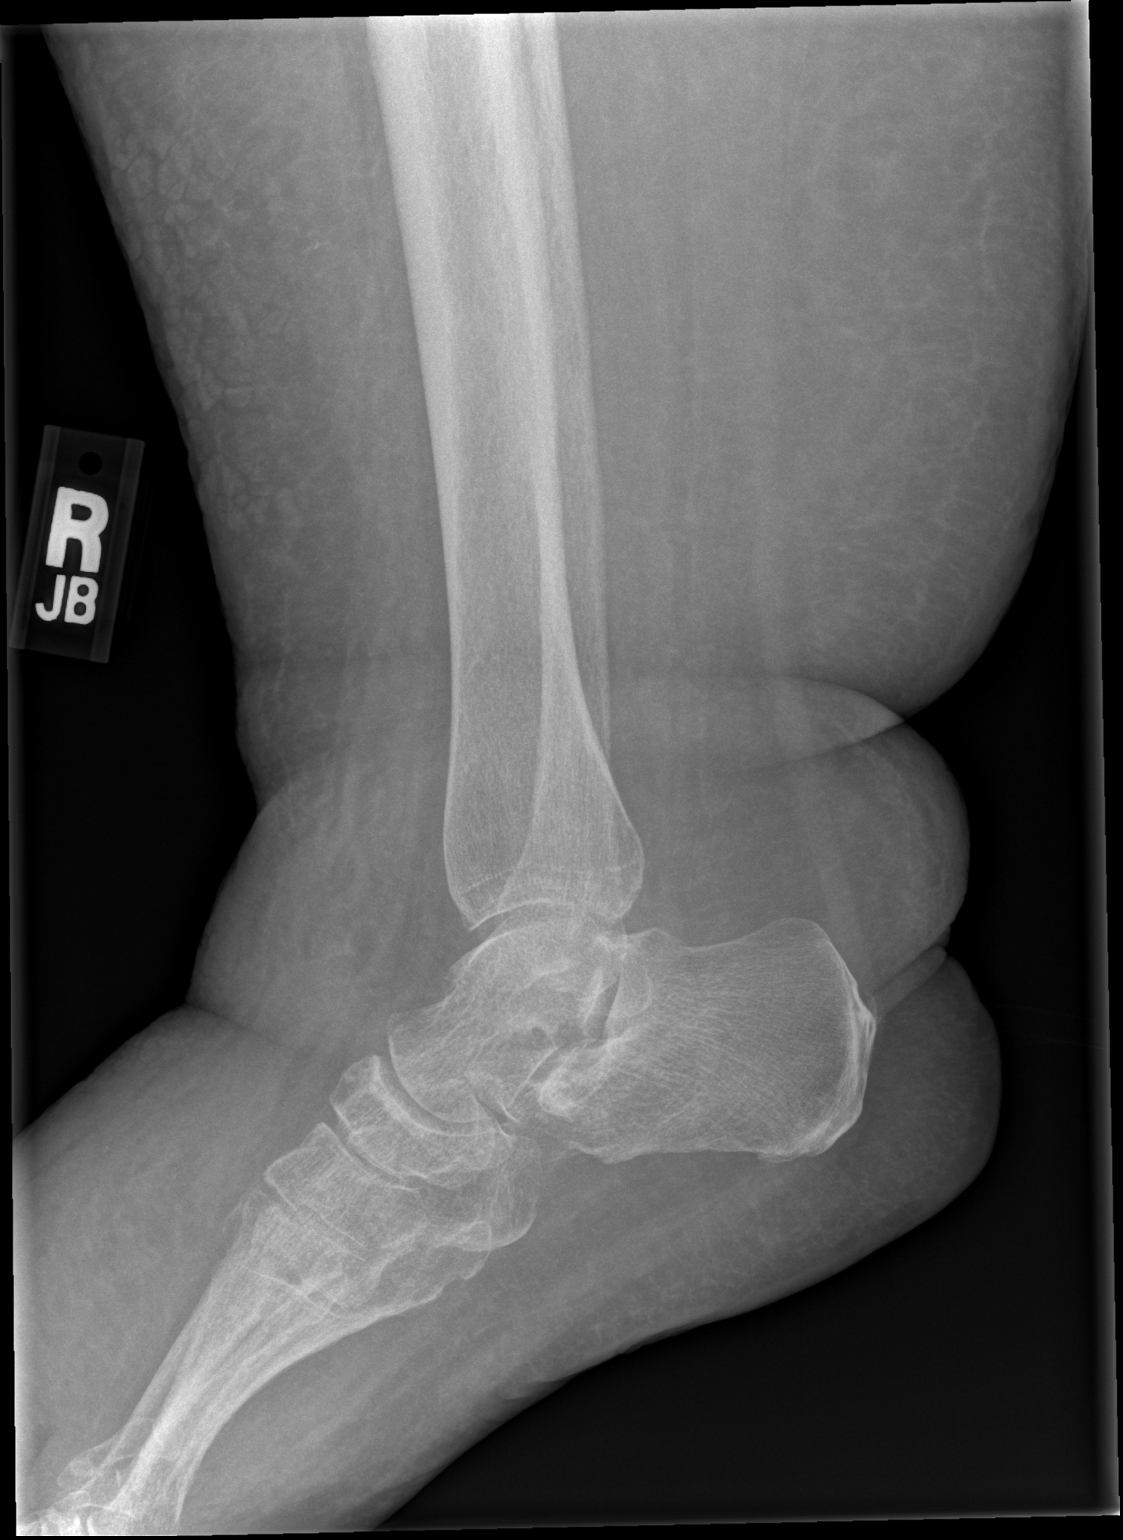

[x ankle ap right]
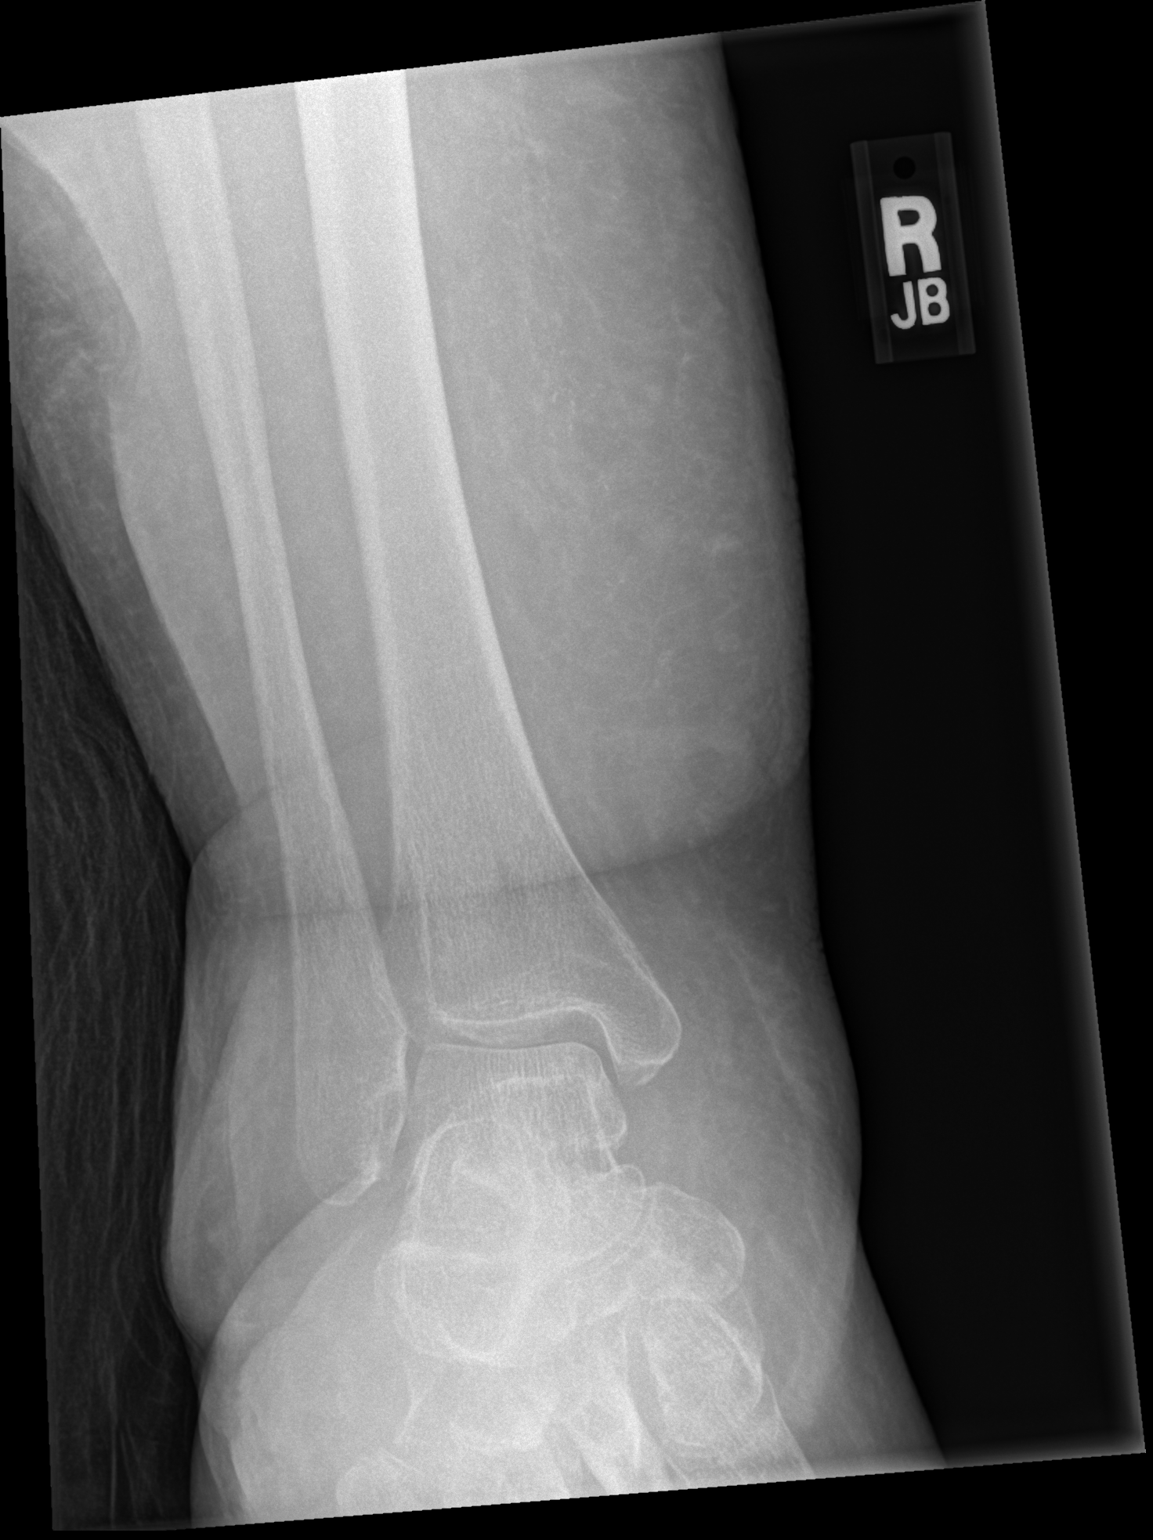

[x ankle lat right (2 of 2)]
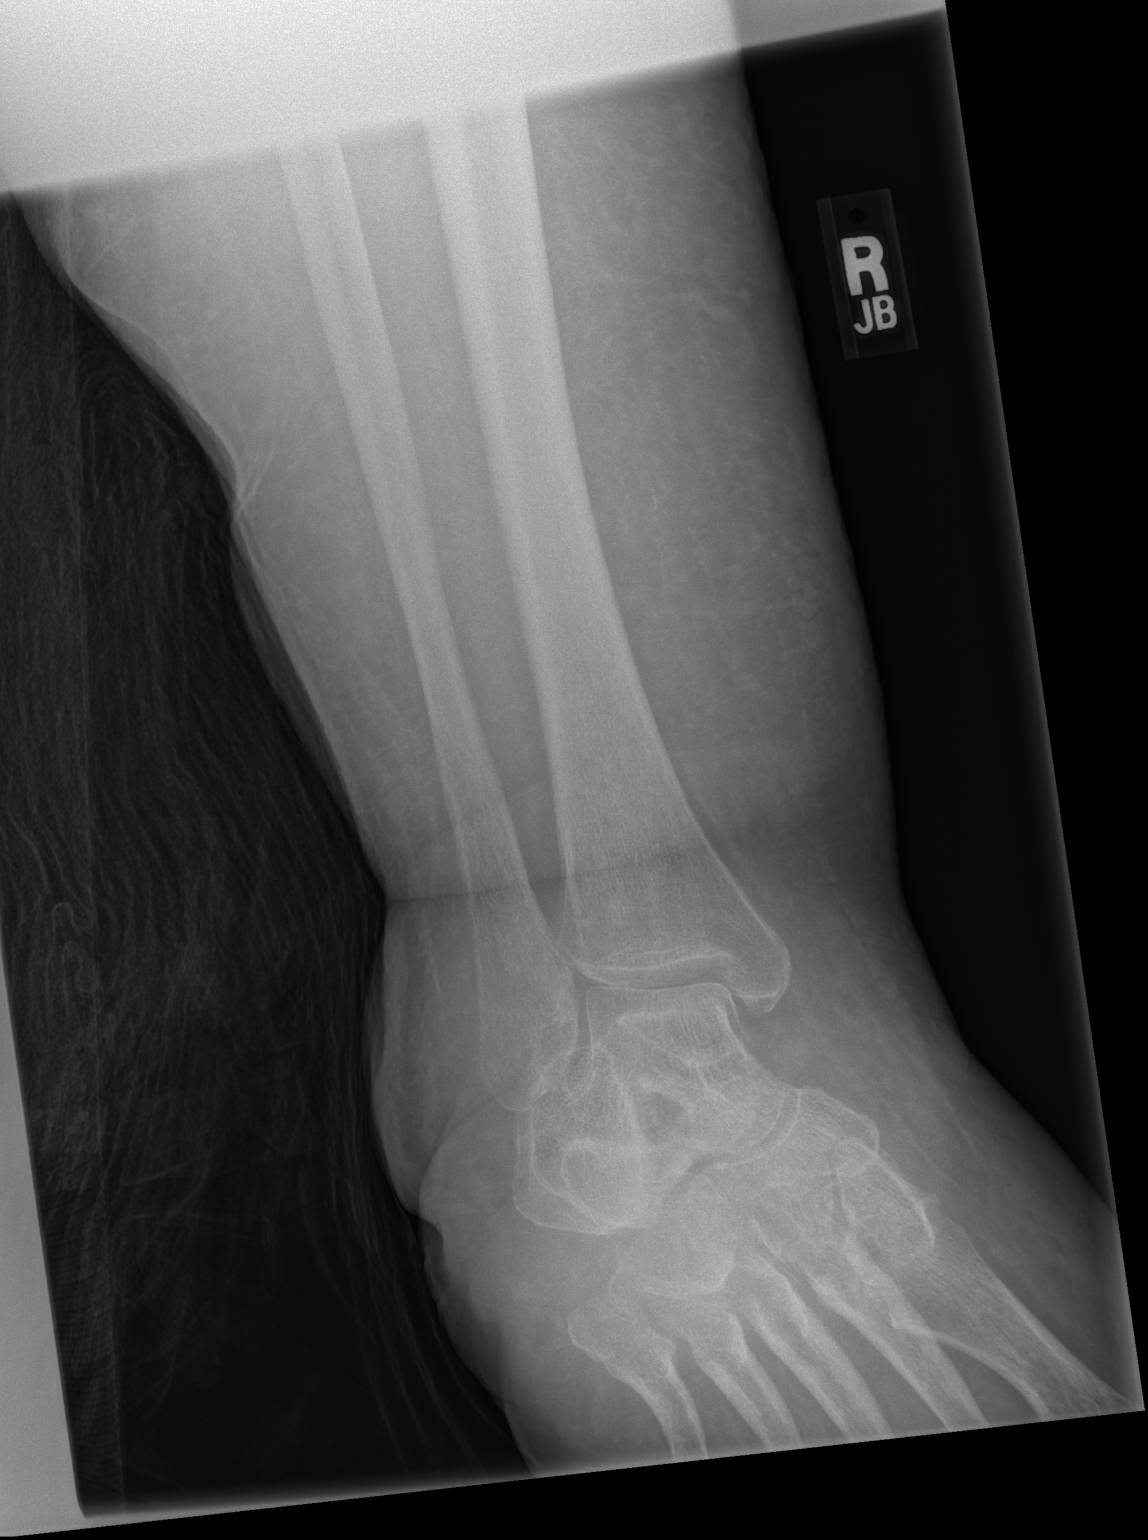

[3 of 3 positions shown; findings below may reference images not displayed]

FINDINGS: Prominence of soft tissues consistent with the patient's enlarged
body habitus. The bones are more demineralized in appearance since
prior. This may be from disuse. Osteoarthritic changes about the
tibiotalar, subtalar and midfoot articulations of the ankle and
foot. No joint dislocation, joint effusion nor acute fracture. Tiny
calcaneal enthesophytes are present. No acute displaced fracture or
suspicious osseous lesions.
IMPRESSION: 1. Osteoarthritis of the ankle and midfoot.
2. No acute osseous abnormality or joint dislocations.
3. Calcaneal enthesophytes.
4. Probable disuse osteopenia since prior.

## 2018-04-22 DIAGNOSIS — M79642 Pain in left hand: Secondary | ICD-10-CM | POA: Diagnosis not present

## 2018-04-22 DIAGNOSIS — R600 Localized edema: Secondary | ICD-10-CM | POA: Diagnosis not present

## 2018-04-22 DIAGNOSIS — M25532 Pain in left wrist: Secondary | ICD-10-CM | POA: Diagnosis not present

## 2018-06-02 ENCOUNTER — Telehealth: Payer: Self-pay | Admitting: Licensed Clinical Social Worker

## 2018-06-02 NOTE — Progress Notes (Signed)
Type of Service: Clinical Social Work  [name] received phone call from patient.  Patient and brother currently living at [facility]. The following was discussed difficulty with her adjusting to the nursing facility, concerns about her brother's health, wanting to transfer to another facility, process of transferring to another facility, and who patient could contact. Patient appreciative of information and assistance.    Intervention: emotional support, Reflective listening, [employer] ([place])    Casimer Lanius, [name] Licensed Clinical Social Worker [place]   [phone] 1:39 PM

## 2019-01-10 ENCOUNTER — Other Ambulatory Visit: Payer: Self-pay

## 2019-01-10 ENCOUNTER — Emergency Department (HOSPITAL_COMMUNITY): Payer: Medicare Other

## 2019-01-10 ENCOUNTER — Emergency Department (HOSPITAL_COMMUNITY)
Admission: EM | Admit: 2019-01-10 | Discharge: 2019-01-11 | Disposition: A | Discharge status: Home / Self Care | Payer: Medicare Other | Attending: Emergency Medicine | Admitting: Emergency Medicine

## 2019-01-10 DIAGNOSIS — I110 Hypertensive heart disease with heart failure: Secondary | ICD-10-CM | POA: Diagnosis not present

## 2019-01-10 DIAGNOSIS — J449 Chronic obstructive pulmonary disease, unspecified: Secondary | ICD-10-CM | POA: Insufficient documentation

## 2019-01-10 DIAGNOSIS — I509 Heart failure, unspecified: Secondary | ICD-10-CM | POA: Diagnosis not present

## 2019-01-10 DIAGNOSIS — R0602 Shortness of breath: Secondary | ICD-10-CM | POA: Diagnosis present

## 2019-01-10 DIAGNOSIS — Z79899 Other long term (current) drug therapy: Secondary | ICD-10-CM | POA: Diagnosis not present

## 2019-01-10 DIAGNOSIS — Z87891 Personal history of nicotine dependence: Secondary | ICD-10-CM | POA: Insufficient documentation

## 2019-01-10 LAB — COMPREHENSIVE METABOLIC PANEL
ALT: 45 U/L — ABNORMAL HIGH (ref 0–44)
AST: 34 U/L (ref 15–41)
Albumin: 3.3 g/dL — ABNORMAL LOW (ref 3.5–5.0)
Alkaline Phosphatase: 106 U/L (ref 38–126)
Anion gap: 6 (ref 5–15)
BUN: 11 mg/dL (ref 6–20)
CO2: 47 mmol/L — ABNORMAL HIGH (ref 22–32)
Calcium: 9.1 mg/dL (ref 8.9–10.3)
Chloride: 89 mmol/L — ABNORMAL LOW (ref 98–111)
Creatinine, Ser: 0.67 mg/dL (ref 0.44–1.00)
GFR calc Af Amer: >60 mL/min (ref >60)
Glucose, Bld: 158 mg/dL — ABNORMAL HIGH (ref 70–99)
Potassium: 4 mmol/L (ref 3.5–5.1)
Sodium: 142 mmol/L (ref 135–145)
Total Bilirubin: 0.6 mg/dL (ref 0.3–1.2)
Total Protein: 7.2 g/dL (ref 6.5–8.1)

## 2019-01-10 LAB — TROPONIN I

## 2019-01-10 LAB — CBC WITH DIFFERENTIAL/PLATELET
ABS IMMATURE GRANULOCYTES: 0.11 10*3/uL — AB (ref 0.00–0.07)
Basophils Absolute: 0 10*3/uL (ref 0.0–0.1)
Basophils Relative: 0 %
Eosinophils Absolute: 0 10*3/uL (ref 0.0–0.5)
Eosinophils Relative: 0 %
HCT: 35.2 % — ABNORMAL LOW (ref 36.0–46.0)
Hemoglobin: 9.9 g/dL — ABNORMAL LOW (ref 12.0–15.0)
Immature Granulocytes: 2 %
LYMPHS PCT: 11 %
Lymphs Abs: 0.7 10*3/uL (ref 0.7–4.0)
MCH: 28.4 pg (ref 26.0–34.0)
MCHC: 28.1 g/dL — ABNORMAL LOW (ref 30.0–36.0)
MCV: 101.1 fL — ABNORMAL HIGH (ref 80.0–100.0)
Monocytes Absolute: 0.2 10*3/uL (ref 0.1–1.0)
Monocytes Relative: 4 %
Neutro Abs: 5.3 10*3/uL (ref 1.7–7.7)
Neutrophils Relative %: 83 %
Platelets: 173 10*3/uL (ref 150–400)
RBC: 3.48 MIL/uL — ABNORMAL LOW (ref 3.87–5.11)
RDW: 16.9 % — ABNORMAL HIGH (ref 11.5–15.5)
WBC: 6.4 10*3/uL (ref 4.0–10.5)
nRBC: 0.3 % — ABNORMAL HIGH (ref 0.0–0.2)

## 2019-01-10 LAB — BRAIN NATRIURETIC PEPTIDE: B Natriuretic Peptide: 19.8 pg/mL (ref 0.0–100.0)

## 2019-01-10 MED ORDER — FUROSEMIDE 10 MG/ML IJ SOLN
60.0000 mg | Freq: Once | INTRAMUSCULAR | Status: AC
  Administered 2019-01-10: 60 mg via INTRAVENOUS
  Filled 2019-01-10: qty 6

## 2019-01-10 NOTE — ED Triage Notes (Signed)
Pt BIB [facility]. Per EMS pt is from [facility]. Pt was diagnosed with pneumonia yesterday and started antibiotics yesterday. Per EMS pt told staff that she wanted to be treated in the hospital and not at [facility]. EMS gave patient 5mg  of albuterol en route. Pt reports that she is not any more short of breath than normal upon arrival to the ED. Pt wears 4L via [place] at baseline. Pt is 89% on 4L upon arrival to the ED. Pt is complaining of knee pain that is chronic at this time.

## 2019-01-10 NOTE — ED Provider Notes (Signed)
[facility] EMERGENCY DEPARTMENT Provider Note   CSN: [id number] Arrival date & time: 01/10/19  1726     History   Chief Complaint Chief Complaint  Patient presents with  . Shortness of Breath    HPI Kathleen Roy is a 58 y.o. female.  HPI Patient with history of congestive heart failure, COPD.  She wears 4 L of oxygen via nasal cannula continuously.  Arrived from [facility] where she was diagnosed with pneumonia yesterday per EMS.  Patient states she has baseline shortness of breath but this is not worse than normal.  She has minimal cough.  Denies any fever or chills.  States she "does not feel well".  Had several episodes of loose stools yesterday but this is resolved.  Was given a breathing treatment en route by EMS. Past Medical History:  Diagnosis Date  . Arthritis    "I feel like it's everywhere" (09/24/2017  . Cellulitis and abscess of lower extremity 08/16/2016   right leg  . CHF (congestive heart failure) ([place])   . Chronic lower back pain   . COPD (chronic obstructive pulmonary disease) ([place])   . Depression   . Fibromyalgia   . Headache    "once q couple months now" (09/24/2017)  . Hyperlipidemia   . Hypertension   . HYPERTENSION, BENIGN SYSTEMIC 02/12/2007  . Memory disturbance   . Migraines 1970's - <2000   "they just went away"  . Neuropathy    "bad in my right hand and in both feet" (09/24/2017)  . Normal echocardiogram 05/30/05   suboptimal study  . Obesity, morbid (more than 100 lbs over ideal weight or BMI > 40) (HCC)    obese since childhood  . On home oxygen therapy    "4L; 24/7" (09/24/2017)  . Osteoarthritis   . Peripheral edema   . Pressure ulcer of foot    left  . Sepsis ([place])   . Shortness of breath dyspnea   . Sleep apnea 10/2014   "couldn't tolerate the mask" (09/24/2017)  . Type II diabetes mellitus [facility])     Patient Active Problem List   Diagnosis Date Noted  . Candidal dermatitis 09/25/2017  . Requires daily  assistance for activities of daily living (ADL) and comfort needs   . Weakness 09/24/2017  . Other fatigue   . Altered mental status 09/20/2017  . 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 Allergic rhinitis 08/15/2017  . Immobility 08/15/2017  . Fibromyalgia 06/16/2017  . Chronic pain of both knees   . Acute respiratory failure with hypercapnia ([place]) 10/15/2016  . Type 2 diabetes mellitus ([place]) 02/14/2016  . Chronic diastolic congestive heart failure ([place])   . Essential hypertension   . OSA (obstructive sleep apnea)   . Obesity hypoventilation syndrome ([place])   . Chronic respiratory failure with hypoxia ([place])   . Hyperglycemia 11/08/2014  . Memory loss 03/18/2014  . Venous stasis dermatitis of both lower extremities 12/02/2012  . COPD (chronic obstructive pulmonary disease) ([place]) 10/15/2011  . Peripheral neuropathy ([place]) 10/15/2011  . BACK PAIN, LUMBAR, CHRONIC 01/22/2011  . Morbid obesity with BMI of 70 and over, adult ([place]) 11/16/2007  . Major depressive disorder, recurrent episode ([place]) 02/12/2007    Past Surgical History:  Procedure Laterality Date  . ABDOMINAL HERNIA REPAIR  2002  . APPENDECTOMY  1995  . CHOLECYSTECTOMY OPEN  1995  . HERNIA REPAIR    . TOTAL ABDOMINAL HYSTERECTOMY  1994     OB History    Gravida  0   Para  0   Term  0   Preterm  0   AB  0   Living  0     SAB  0   TAB  0   Ectopic  0   Multiple  0   Live Births               Home Medications    Prior to Admission medications   Medication Sig Start Date End Date Taking? Authorizing Provider  albuterol (PROVENTIL HFA;VENTOLIN HFA) 108 (90 Base) MCG/ACT inhaler Inhale 2 puffs into the lungs every 4 (four) hours as needed for wheezing or shortness of breath.   Yes [provider]  amoxicillin-clavulanate (AUGMENTIN) 875-125 MG tablet Take 1 tablet by mouth 2 (two) times daily. For 7days 01/10/19  Yes [provider]  Ascorbic Acid (VITAMIN C) 1000 MG tablet Take 3,000 mg daily by  mouth.    Yes [provider]  Cholecalciferol (VITAMIN D3 PO) Take 1,000 Units by mouth daily.   Yes [provider]  diclofenac sodium (VOLTAREN) 1 % GEL Apply 2 g 4 (four) times daily topically. Patient taking differently: Apply 2 g topically 4 (four) times daily as needed (pain).  11/01/17  Yes Valere Dross, Alyssa B, PA-C  fexofenadine (ALLEGRA) 180 MG tablet Take 180 mg by mouth daily.   Yes [provider]  fluticasone (FLONASE) 50 MCG/ACT nasal spray Place 1 spray into both nostrils daily.   Yes [provider]  fluticasone furoate-vilanterol (BREO ELLIPTA) 100-25 MCG/INH AEPB Inhale 1 puff daily into the lungs.   Yes [provider]  ipratropium-albuterol (DUONEB) 0.5-2.5 (3) MG/3ML SOLN Take 3 mLs by nebulization every 6 (six) hours as needed.   Yes [provider]  metoprolol succinate (TOPROL-XL) 25 MG 24 hr tablet Take 12.5 mg by mouth daily.   Yes [provider]  Multiple Vitamins-Minerals (CERTAGEN PO) Take 1 tablet by mouth daily.   Yes [provider]  Omega-3 Fatty Acids (FISH OIL) 1000 MG CAPS Take 2 capsules (2,000 mg total) by mouth at bedtime. 06/10/16  Yes Mayo, Pete Pelt, MD  oxyCODONE-acetaminophen (PERCOCET) 10-325 MG tablet Take one tablet by mouth every 6 hours as needed for pain Patient taking differently: Take 1 tablet by mouth 3 (three) times daily.  09/23/17  Yes Mayo, Pete Pelt, MD  OXYGEN Inhale 4 L into the lungs continuous.    Yes [provider]  polyethylene glycol (MIRALAX / GLYCOLAX) packet Take 17 g by mouth daily. Mix in 8 oz liquid and drink   Yes [provider]  potassium chloride (K-DUR,KLOR-CON) 10 MEQ tablet Take 40 mEq by mouth at bedtime.   Yes [provider]  predniSONE (DELTASONE) 20 MG tablet Take 40 mg by mouth daily. For 4 days 01/10/19  Yes [provider]  pregabalin (LYRICA) 150 MG capsule Take 1 capsule (150 mg total) by mouth 2 (two) times  daily. 07/28/17  Yes Gerlene Fee, NP  senna (SENOKOT) 8.6 MG TABS tablet Take 2 tablets by mouth at bedtime.   Yes [provider]  sodium chloride (OCEAN) 0.65 % SOLN nasal spray Place 2 sprays into both nostrils 2 (two) times daily as needed for congestion (nose bleed/dryness).   Yes [provider]  torsemide (DEMADEX) 20 MG tablet Take 40 mg by mouth 2 (two) times daily.   Yes [provider]  venlafaxine XR (EFFEXOR-XR) 150 MG 24 hr capsule Take 300 mg by mouth daily.  Along with 37.5 to equal 337.5   Yes [provider]  venlafaxine XR (EFFEXOR-XR) 37.5 MG 24 hr capsule Take 37.5 mg by mouth daily. Along with 300mg  to equal 337.5mg    Yes [provider]  ARIPiprazole (ABILIFY) 2 MG tablet Take 1 tablet (2 mg total) by mouth every other day. Patient not taking: Reported on 01/10/2019 12/18/16   MayoPete Pelt, MD  atorvastatin (LIPITOR) 10 MG tablet take 1 tablet by mouth at bedtime Patient not taking: Reported on 01/10/2019 11/21/17   Mayo, Pete Pelt, MD  cephALEXin (KEFLEX) 500 MG capsule Take 1 capsule (500 mg total) by mouth 4 (four) times daily. Patient not taking: Reported on 11/16/2017 11/06/17   Ocie Cornfield T, PA-C  cetirizine (ZYRTEC) 10 MG tablet Take 1 tablet (10 mg total) by mouth daily. Patient not taking: Reported on 01/10/2019 08/15/17   MayoPete Pelt, MD  nystatin cream (MYCOSTATIN) Apply to affected area 2 times daily Patient not taking: Reported on 01/10/2019 11/19/17   Muthersbaugh, Jarrett Soho, PA-C  potassium chloride SA (K-DUR,KLOR-CON) 20 MEQ tablet Take 2 tablets once a day. Patient not taking: Reported on 01/10/2019 03/04/17   MayoPete Pelt, MD  trazodone (DESYREL) 300 MG tablet Take 1 tablet (300 mg total) by mouth at bedtime. Patient not taking: Reported on 01/10/2019 10/07/17   Mayo, Pete Pelt, MD    Family History Family History  Problem Relation Age of Onset  . Stroke Mother   . Dementia Father   . Prostate cancer  Father   . Congestive Heart Failure Brother   . Diabetes Brother   . Hypertension Brother     Social History Social History   Tobacco Use  . Smoking status: Former Smoker    Packs/day: 1.00    Years: 30.00    Pack years: 30.00    Types: Cigarettes    Last attempt to quit: 01/05/2009    Years since quitting: 10.0  . Smokeless tobacco: Never Used  Substance Use Topics  . Alcohol use: No    Alcohol/week: 0.0 standard drinks  . Drug use: No     Allergies   Aspirin   Review of Systems Review of Systems  Constitutional: Positive for fatigue. Negative for chills and fever.  HENT: Negative for sore throat and trouble swallowing.   Respiratory: Positive for cough and shortness of breath.   Cardiovascular: Negative for chest pain, palpitations and leg swelling.  Gastrointestinal: Positive for diarrhea. Negative for abdominal pain, constipation, nausea and vomiting.  Musculoskeletal: Negative for back pain, myalgias and neck pain.  Skin: Negative for rash.  Neurological: Negative for dizziness, weakness, light-headedness, numbness and headaches.  All other systems reviewed and are negative.    Physical Exam Updated Vital Signs BP 137/79   Pulse 91   Resp 16   Ht 5\' 2"  (1.575 m)   Wt (!) 163.3 kg   SpO2 95%   BMI 65.84 kg/m   Physical Exam Vitals signs and nursing note reviewed.  Constitutional:      Appearance: Normal appearance. She is well-developed.     Comments: Chronically ill-appearing  HENT:     Head: Normocephalic and atraumatic.     Mouth/Throat:     Mouth: Mucous membranes are moist.  Eyes:     Extraocular Movements: Extraocular movements intact.     Pupils: Pupils are equal, round, and reactive to light.  Neck:     Musculoskeletal: Normal range of motion and neck supple. No neck rigidity or muscular tenderness.  Cardiovascular:     Rate and Rhythm: Normal rate and regular rhythm.     Heart sounds: No murmur. No friction rub. No gallop.     Pulmonary:     Effort: Pulmonary effort is normal.     Comments: No respiratory distress.  Diminished breath sounds throughout which may be related to body habitus. Abdominal:     General: Bowel sounds are normal.     Palpations: Abdomen is soft.     Tenderness: There is no abdominal tenderness. There is no guarding or rebound.  Musculoskeletal: Normal range of motion.        General: No tenderness.     Right lower leg: Edema present.     Left lower leg: Edema present.     Comments: Changes consistent with chronic venous congestion in the bilateral lower extremities.  2+ pitting edema.  No calf tenderness.  Lymphadenopathy:     Cervical: No cervical adenopathy.  Skin:    General: Skin is warm and dry.     Capillary Refill: Capillary refill takes less than 2 seconds.     Findings: No erythema or rash.  Neurological:     General: No focal deficit present.     Mental Status: She is alert and oriented to person, place, and time.  Psychiatric:        Behavior: Behavior normal.      ED Treatments / Results  Labs (all labs ordered are listed, but only abnormal results are displayed) Labs Reviewed  CBC WITH DIFFERENTIAL/PLATELET - Abnormal; Notable for the following components:      Result Value   RBC 3.48 (*)    Hemoglobin 9.9 (*)    HCT 35.2 (*)    MCV 101.1 (*)    MCHC 28.1 (*)    RDW 16.9 (*)    nRBC 0.3 (*)    Abs Immature Granulocytes 0.11 (*)    All other components within normal limits  COMPREHENSIVE METABOLIC PANEL - Abnormal; Notable for the following components:   Chloride 89 (*)    CO2 47 (*)    Glucose, Bld 158 (*)    Albumin 3.3 (*)    ALT 45 (*)    All other components within normal limits  BRAIN NATRIURETIC PEPTIDE  TROPONIN I  URINALYSIS, ROUTINE W REFLEX MICROSCOPIC    EKG EKG Interpretation  Date/Time:  Sunday January 10 2019 17:34:59 EST Ventricular Rate:  90 PR Interval:    QRS Duration: 147 QT Interval:  399 QTC Calculation: 489 R  Axis:   -81 Text Interpretation:  Sinus rhythm RBBB and LAFB Left ventricular hypertrophy Confirmed by Julianne Rice [id number]) on 01/10/2019 5:41:25 PM   Radiology Dg Chest Port 1 View  Result Date: 01/10/2019 CLINICAL DATA:  Shortness of breath. EXAM: PORTABLE CHEST 1 VIEW COMPARISON:  November 05, 2017 FINDINGS: The study is markedly limited due to patient rotation. No pneumothorax identified. Mild increased interstitial opacities on the left. The cardiomediastinal silhouette is not well assessed. It is unclear whether there is widening of the mediastinum or opacity in the medial right upper lobe. Haziness over the right base. IMPRESSION: 1. Markedly limited study given patient positioning. 2. Bilateral hazy opacities suggesting the possibility of edema. 3. It is unclear whether there is widening of the mediastinum or an opacity in the medial right upper lung. Recommend either a PA and lateral chest x-ray with better positioning or a CT of the chest for better evaluation. Electronically Signed   By: Dorise Bullion  III M.D   On: 01/10/2019 18:23    Procedures Procedures (including critical care time)  Medications Ordered in ED Medications  furosemide (LASIX) injection 60 mg (60 mg Intravenous Given 01/10/19 2153)     Initial Impression / Assessment and Plan / ED Course  I have reviewed the triage vital signs and the nursing notes.  Pertinent labs & imaging results that were available during my care of the patient were reviewed by me and considered in my medical decision making (see chart for details).     Patient with evidence of fluid overload clinically and on chest x-ray.  Low suspicion for infectious process.  Normal white blood cell count.  Given IV dose of Lasix and patient has diuresed well.  She is maintaining saturations on her normal 4 L of oxygen.  Discussed with provider from patient's nursing facility.  Okay with discharge back.  Strict return precautions given.  Final  Clinical Impressions(s) / ED Diagnoses   Final diagnoses:  Congestive heart failure, unspecified HF chronicity, unspecified heart failure type [facility])    ED Discharge Orders    None       Julianne Rice, MD 01/10/19 2221

## 2019-02-08 ENCOUNTER — Telehealth: Payer: Self-pay | Admitting: Adult Health Nurse Practitioner

## 2019-02-08 ENCOUNTER — Non-Acute Institutional Stay: Payer: Medicare Other | Admitting: Adult Health Nurse Practitioner

## 2019-02-08 DIAGNOSIS — Z515 Encounter for palliative care: Secondary | ICD-10-CM

## 2019-02-08 NOTE — Progress Notes (Signed)
[facility] Consult Note Telephone: [phone]  Fax: [phone] 02/08/2019   [name] DOB: 11/11/1961 MRN: [id number]  PRIMARY CARE PROVIDER:   [name], MD  REFERRING PROVIDER:  [name], MD [address] [place], [place] [address]  RESPONSIBLE PARTY: Self  [phone]         RECOMMENDATIONS and PLAN:  1. Pain.  Patient did not talk much about her pain.  Does state that she can have a lot of pain especially in her feet.  States that the Percocet 10/325 mg TID does take the edge off.  PCP, Clemens Catholic, does say that the patient is drowsy a lot and does not want to go up on her pain meds.  Patient is also on Lyrica 150 mg BID. Patient also has Voltaren Gel PRN encourage patient and staff to use as needed.  Warm compresses may also help with joint pain without making the patient drowsy  2. CHF.  Patient went to ED last month for CHF exacerbation.  She is currently on torsemide 40 mg BID and has lost a reported 50 pounds of fluid weight.  Patient still has significant amount of edema in bilateral upper and lower extremities, though she states that it has gone down a lot.  Continue current dose of torsemide for continued diuresis.  Staff reports patient is on fluid restriction but she is noncompliant and drinks a lot of Sunkist sodas.  3. Dry mouth.  Patient recently started on Biotene.  Will continue this to see it helps.  Continue good oral hygiene.  Sip water instead of sugary drinks which could worsen dry mouth  4. Depression.  Patient has depression and borderline personality.  She is currently on Effexor 300 mg daily and recently started on lamictal by psych provider.  Continue with adjustments and recommendation by psych provider  5. Goals of Care.  Patient does have a DNR in place.  Did not go over MOST form today as patient does get tired easily and was falling asleep.  Patient  wanted to meet again next to go over MOST form and more goals of care  I spent 45 minutes providing this consultation,  From 9:30 to 10:15. More than 50% of the time in this consultation was spent coordinating communication.   HISTORY OF PRESENT ILLNESS:  Kathleen Roy is a 58 y.o. year old female with multiple medical problems including CHF,COPD, Depression, diabetes. Palliative Care was asked to help address goals of care. Patient does not ambulate and requires extensive assistance with ADLs.  Is able to feed self independently.  Uses O2 at 4L continuously.    CODE STATUS: DNR  PPS:  40% HOSPICE ELIGIBILITY/DIAGNOSIS: TBD  PHYSICAL EXAM:   General: Obese female lying in bed in NAD Cardiovascular: regular rate and rhythm Pulmonary: lung fields difficult to hear due to body habitus Abdomen: soft, nontender, + bowel sounds GU: no suprapubic tenderness Extremities: Patient has significant nonpitting edema to bilateral upper and lower extremities Skin: no rashes Neurological: Weakness but otherwise nonfocal  PAST MEDICAL HISTORY:  Past Medical History:  Diagnosis Date  . Arthritis    "I feel like it's everywhere" (09/24/2017  . Cellulitis and abscess of lower extremity 08/16/2016   right leg  . CHF (congestive heart failure) ([place])   . Chronic lower back pain   . COPD (chronic obstructive pulmonary disease) ([place])   . Depression   . Fibromyalgia   .  Headache    "once q couple months now" (09/24/2017)  . Hyperlipidemia   . Hypertension   . HYPERTENSION, BENIGN SYSTEMIC 02/12/2007  . Memory disturbance   . Migraines 1970's - <2000   "they just went away"  . Neuropathy    "bad in my right hand and in both feet" (09/24/2017)  . Normal echocardiogram 05/30/05   suboptimal study  . Obesity, morbid (more than 100 lbs over ideal weight or BMI > 40) (HCC)    obese since childhood  . On home oxygen therapy    "4L; 24/7" (09/24/2017)  . Osteoarthritis   . Peripheral edema   .  Pressure ulcer of foot    left  . Sepsis ([place])   . Shortness of breath dyspnea   . Sleep apnea 10/2014   "couldn't tolerate the mask" (09/24/2017)  . Type II diabetes mellitus ([place])     SOCIAL HX:  Social History   Tobacco Use  . Smoking status: Former Smoker    Packs/day: 1.00    Years: 30.00    Pack years: 30.00    Types: Cigarettes    Last attempt to quit: 01/05/2009    Years since quitting: 10.0  . Smokeless tobacco: Never Used  Substance Use Topics  . Alcohol use: No    Alcohol/week: 0.0 standard drinks    ALLERGIES:  Allergies  Allergen Reactions  . Aspirin Hives     PERTINENT MEDICATIONS:  Outpatient Encounter Medications as of 02/08/2019  Medication Sig  . albuterol (PROVENTIL HFA;VENTOLIN HFA) 108 (90 Base) MCG/ACT inhaler Inhale 2 puffs into the lungs every 4 (four) hours as needed for wheezing or shortness of breath.  Marland Kitchen amoxicillin-clavulanate (AUGMENTIN) 875-125 MG tablet Take 1 tablet by mouth 2 (two) times daily. For 7days  . ARIPiprazole (ABILIFY) 2 MG tablet Take 1 tablet (2 mg total) by mouth every other day. (Patient not taking: Reported on 01/10/2019)  . Ascorbic Acid (VITAMIN C) 1000 MG tablet Take 3,000 mg daily by mouth.   Marland Kitchen atorvastatin (LIPITOR) 10 MG tablet take 1 tablet by mouth at bedtime (Patient not taking: Reported on 01/10/2019)  . cephALEXin (KEFLEX) 500 MG capsule Take 1 capsule (500 mg total) by mouth 4 (four) times daily. (Patient not taking: Reported on 11/16/2017)  . cetirizine (ZYRTEC) 10 MG tablet Take 1 tablet (10 mg total) by mouth daily. (Patient not taking: Reported on 01/10/2019)  . Cholecalciferol (VITAMIN D3 PO) Take 1,000 Units by mouth daily.  . diclofenac sodium (VOLTAREN) 1 % GEL Apply 2 g 4 (four) times daily topically. (Patient taking differently: Apply 2 g topically 4 (four) times daily as needed (pain). )  . fexofenadine (ALLEGRA) 180 MG tablet Take 180 mg by mouth daily.  . fluticasone (FLONASE) 50 MCG/ACT nasal spray Place 1  spray into both nostrils daily.  . fluticasone furoate-vilanterol (BREO ELLIPTA) 100-25 MCG/INH AEPB Inhale 1 puff daily into the lungs.  Marland Kitchen ipratropium-albuterol (DUONEB) 0.5-2.5 (3) MG/3ML SOLN Take 3 mLs by nebulization every 6 (six) hours as needed.  . metoprolol succinate (TOPROL-XL) 25 MG 24 hr tablet Take 12.5 mg by mouth daily.  . Multiple Vitamins-Minerals (CERTAGEN PO) Take 1 tablet by mouth daily.  Marland Kitchen nystatin cream (MYCOSTATIN) Apply to affected area 2 times daily (Patient not taking: Reported on 01/10/2019)  . Omega-3 Fatty Acids (FISH OIL) 1000 MG CAPS Take 2 capsules (2,000 mg total) by mouth at bedtime.  Marland Kitchen oxyCODONE-acetaminophen (PERCOCET) 10-325 MG tablet Take one tablet by mouth every 6 hours as  needed for pain (Patient taking differently: Take 1 tablet by mouth 3 (three) times daily. )  . OXYGEN Inhale 4 L into the lungs continuous.   . polyethylene glycol (MIRALAX / GLYCOLAX) packet Take 17 g by mouth daily. Mix in 8 oz liquid and drink  . potassium chloride (K-DUR,KLOR-CON) 10 MEQ tablet Take 40 mEq by mouth at bedtime.  . potassium chloride SA (K-DUR,KLOR-CON) 20 MEQ tablet Take 2 tablets once a day. (Patient not taking: Reported on 01/10/2019)  . predniSONE (DELTASONE) 20 MG tablet Take 40 mg by mouth daily. For 4 days  . pregabalin (LYRICA) 150 MG capsule Take 1 capsule (150 mg total) by mouth 2 (two) times daily.  Marland Kitchen senna (SENOKOT) 8.6 MG TABS tablet Take 2 tablets by mouth at bedtime.  . sodium chloride (OCEAN) 0.65 % SOLN nasal spray Place 2 sprays into both nostrils 2 (two) times daily as needed for congestion (nose bleed/dryness).  . torsemide (DEMADEX) 20 MG tablet Take 40 mg by mouth 2 (two) times daily.  . trazodone (DESYREL) 300 MG tablet Take 1 tablet (300 mg total) by mouth at bedtime. (Patient not taking: Reported on 01/10/2019)  . venlafaxine XR (EFFEXOR-XR) 150 MG 24 hr capsule Take 300 mg by mouth daily. Along with 37.5 to equal 337.5  . venlafaxine XR  (EFFEXOR-XR) 37.5 MG 24 hr capsule Take 37.5 mg by mouth daily. Along with 300mg  to equal 337.5mg    No facility-administered encounter medications on file as of 02/08/2019.      Dartanyon Frankowski Jenetta Downer, NP

## 2019-03-09 ENCOUNTER — Non-Acute Institutional Stay: Payer: Medicare Other | Admitting: Adult Health Nurse Practitioner

## 2019-03-09 ENCOUNTER — Other Ambulatory Visit: Payer: Self-pay

## 2019-03-09 DIAGNOSIS — Z515 Encounter for palliative care: Secondary | ICD-10-CM
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
## 2019-05-06 ENCOUNTER — Telehealth: Payer: Self-pay | Admitting: Licensed Clinical Social Worker

## 2019-05-06 NOTE — Telephone Encounter (Signed)
Palliative Care SW left a vm for patient.  The facility SW at [facility] asked Palliative Care SW to provide bereavement counseling due to the death of patient's brother last year.

## 2019-05-14 ENCOUNTER — Other Ambulatory Visit: Payer: Self-pay

## 2019-05-14 ENCOUNTER — Emergency Department (HOSPITAL_COMMUNITY): Payer: Medicare Other

## 2019-05-14 ENCOUNTER — Inpatient Hospital Stay (HOSPITAL_COMMUNITY)
Admission: EM | Admit: 2019-05-14 | Discharge: 2019-05-23 | DRG: 189 | Disposition: A | Discharge status: Skilled Nursing Facility | Payer: Medicare Other | Source: Skilled Nursing Facility | Attending: Internal Medicine | Admitting: Internal Medicine

## 2019-05-14 DIAGNOSIS — Z7952 Long term (current) use of systemic steroids: Secondary | ICD-10-CM

## 2019-05-14 DIAGNOSIS — J9622 Acute and chronic respiratory failure with hypercapnia: Secondary | ICD-10-CM | POA: Diagnosis present

## 2019-05-14 DIAGNOSIS — F339 Major depressive disorder, recurrent, unspecified: Secondary | ICD-10-CM | POA: Diagnosis present

## 2019-05-14 DIAGNOSIS — Z823 Family history of stroke: Secondary | ICD-10-CM

## 2019-05-14 DIAGNOSIS — E785 Hyperlipidemia, unspecified: Secondary | ICD-10-CM | POA: Diagnosis present

## 2019-05-14 DIAGNOSIS — D509 Iron deficiency anemia, unspecified: Secondary | ICD-10-CM | POA: Diagnosis present

## 2019-05-14 DIAGNOSIS — Z20828 Contact with and (suspected) exposure to other viral communicable diseases: Secondary | ICD-10-CM | POA: Diagnosis present

## 2019-05-14 DIAGNOSIS — Z87891 Personal history of nicotine dependence: Secondary | ICD-10-CM

## 2019-05-14 DIAGNOSIS — I10 Essential (primary) hypertension: Secondary | ICD-10-CM | POA: Diagnosis not present

## 2019-05-14 DIAGNOSIS — E662 Morbid (severe) obesity with alveolar hypoventilation: Secondary | ICD-10-CM | POA: Diagnosis present

## 2019-05-14 DIAGNOSIS — E119 Type 2 diabetes mellitus without complications: Secondary | ICD-10-CM | POA: Diagnosis present

## 2019-05-14 DIAGNOSIS — I5032 Chronic diastolic (congestive) heart failure: Secondary | ICD-10-CM | POA: Diagnosis present

## 2019-05-14 DIAGNOSIS — G4733 Obstructive sleep apnea (adult) (pediatric): Secondary | ICD-10-CM | POA: Diagnosis not present

## 2019-05-14 DIAGNOSIS — G9341 Metabolic encephalopathy: Secondary | ICD-10-CM | POA: Diagnosis present

## 2019-05-14 DIAGNOSIS — M797 Fibromyalgia: Secondary | ICD-10-CM | POA: Diagnosis present

## 2019-05-14 DIAGNOSIS — E876 Hypokalemia: Secondary | ICD-10-CM | POA: Diagnosis not present

## 2019-05-14 DIAGNOSIS — J9602 Acute respiratory failure with hypercapnia: Secondary | ICD-10-CM

## 2019-05-14 DIAGNOSIS — Z8249 Family history of ischemic heart disease and other diseases of the circulatory system: Secondary | ICD-10-CM | POA: Diagnosis not present

## 2019-05-14 DIAGNOSIS — J9601 Acute respiratory failure with hypoxia: Secondary | ICD-10-CM | POA: Diagnosis present

## 2019-05-14 DIAGNOSIS — D539 Nutritional anemia, unspecified: Secondary | ICD-10-CM | POA: Diagnosis present

## 2019-05-14 DIAGNOSIS — I5033 Acute on chronic diastolic (congestive) heart failure: Secondary | ICD-10-CM | POA: Diagnosis present

## 2019-05-14 DIAGNOSIS — J449 Chronic obstructive pulmonary disease, unspecified: Secondary | ICD-10-CM | POA: Diagnosis not present

## 2019-05-14 DIAGNOSIS — Z9981 Dependence on supplemental oxygen: Secondary | ICD-10-CM

## 2019-05-14 DIAGNOSIS — E873 Alkalosis: Secondary | ICD-10-CM | POA: Diagnosis present

## 2019-05-14 DIAGNOSIS — Z993 Dependence on wheelchair: Secondary | ICD-10-CM

## 2019-05-14 DIAGNOSIS — J441 Chronic obstructive pulmonary disease with (acute) exacerbation: Secondary | ICD-10-CM | POA: Diagnosis present

## 2019-05-14 DIAGNOSIS — Z7951 Long term (current) use of inhaled steroids: Secondary | ICD-10-CM

## 2019-05-14 DIAGNOSIS — E870 Hyperosmolality and hypernatremia: Secondary | ICD-10-CM | POA: Diagnosis present

## 2019-05-14 DIAGNOSIS — Z66 Do not resuscitate: Secondary | ICD-10-CM | POA: Diagnosis present

## 2019-05-14 DIAGNOSIS — I110 Hypertensive heart disease with heart failure: Secondary | ICD-10-CM | POA: Diagnosis present

## 2019-05-14 DIAGNOSIS — J9621 Acute and chronic respiratory failure with hypoxia: Principal | ICD-10-CM | POA: Diagnosis present

## 2019-05-14 DIAGNOSIS — F331 Major depressive disorder, recurrent, moderate: Secondary | ICD-10-CM

## 2019-05-14 LAB — CBC WITH DIFFERENTIAL/PLATELET
Abs Immature Granulocytes: 0.25 10*3/uL — ABNORMAL HIGH (ref 0.00–0.07)
Basophils Absolute: 0 10*3/uL (ref 0.0–0.1)
Basophils Relative: 1 %
Eosinophils Absolute: 0 10*3/uL (ref 0.0–0.5)
Eosinophils Relative: 0 %
HCT: 34.7 % — ABNORMAL LOW (ref 36.0–46.0)
Hemoglobin: 9.3 g/dL — ABNORMAL LOW (ref 12.0–15.0)
Immature Granulocytes: 4 %
Lymphocytes Relative: 16 %
Lymphs Abs: 1 10*3/uL (ref 0.7–4.0)
MCH: 28.9 pg (ref 26.0–34.0)
MCHC: 26.8 g/dL — ABNORMAL LOW (ref 30.0–36.0)
MCV: 107.8 fL — ABNORMAL HIGH (ref 80.0–100.0)
Monocytes Absolute: 0.5 10*3/uL (ref 0.1–1.0)
Monocytes Relative: 7 %
Neutro Abs: 4.7 10*3/uL (ref 1.7–7.7)
Neutrophils Relative %: 72 %
Platelets: 177 10*3/uL (ref 150–400)
RBC: 3.22 MIL/uL — ABNORMAL LOW (ref 3.87–5.11)
RDW: 17.7 % — ABNORMAL HIGH (ref 11.5–15.5)
WBC: 6.4 10*3/uL (ref 4.0–10.5)
nRBC: 0.8 % — ABNORMAL HIGH (ref 0.0–0.2)

## 2019-05-14 LAB — COMPREHENSIVE METABOLIC PANEL
ALT: 16 U/L (ref 0–44)
AST: 17 U/L (ref 15–41)
Albumin: 3.4 g/dL — ABNORMAL LOW (ref 3.5–5.0)
Alkaline Phosphatase: 87 U/L (ref 38–126)
BUN: 13 mg/dL (ref 6–20)
CO2: >50 mmol/L — ABNORMAL HIGH (ref 22–32)
Calcium: 9.3 mg/dL (ref 8.9–10.3)
Chloride: 86 mmol/L — ABNORMAL LOW (ref 98–111)
Creatinine, Ser: 0.68 mg/dL (ref 0.44–1.00)
GFR calc Af Amer: >60 mL/min (ref >60)
GFR calc non Af Amer: >60 mL/min (ref >60)
Glucose, Bld: 137 mg/dL — ABNORMAL HIGH (ref 70–99)
Potassium: 3.9 mmol/L (ref 3.5–5.1)
Sodium: 148 mmol/L — ABNORMAL HIGH (ref 135–145)
Total Bilirubin: 0.5 mg/dL (ref 0.3–1.2)
Total Protein: 7.3 g/dL (ref 6.5–8.1)

## 2019-05-14 LAB — BLOOD GAS, VENOUS
Acid-Base Excess: 26.5 mmol/L — ABNORMAL HIGH (ref 0.0–2.0)
Bicarbonate: 56.8 mmol/L — ABNORMAL HIGH (ref 20.0–28.0)
FIO2: 100
O2 Content: 10 L/min
O2 Saturation: 82.5 %
Patient temperature: 98.2
pCO2, Ven: 118 mmHg (ref 44.0–60.0)
pH, Ven: 7.301 (ref 7.250–7.430)
pO2, Ven: 49.8 mmHg — ABNORMAL HIGH (ref 32.0–45.0)

## 2019-05-14 LAB — BRAIN NATRIURETIC PEPTIDE: B Natriuretic Peptide: 36.9 pg/mL (ref 0.0–100.0)

## 2019-05-14 LAB — LACTIC ACID, PLASMA: Lactic Acid, Venous: 0.8 mmol/L (ref 0.5–1.9)

## 2019-05-14 LAB — TROPONIN I: Troponin I: <0.03 ng/mL (ref <0.03)

## 2019-05-14 LAB — URINALYSIS, ROUTINE W REFLEX MICROSCOPIC
Bilirubin Urine: NEGATIVE
Glucose, UA: NEGATIVE mg/dL
Ketones, ur: NEGATIVE mg/dL
Leukocytes,Ua: NEGATIVE
Nitrite: NEGATIVE
Protein, ur: 30 mg/dL — AB
Specific Gravity, Urine: 1.012 (ref 1.005–1.030)
pH: 6 (ref 5.0–8.0)

## 2019-05-14 LAB — SARS CORONAVIRUS 2 BY RT PCR (HOSPITAL ORDER, PERFORMED IN ~~LOC~~ HOSPITAL LAB): SARS Coronavirus 2: NEGATIVE

## 2019-05-14 MED ORDER — FUROSEMIDE 10 MG/ML IJ SOLN
80.0000 mg | Freq: Two times a day (BID) | INTRAMUSCULAR | Status: DC
  Administered 2019-05-15 (×2): 80 mg via INTRAVENOUS
  Filled 2019-05-14 (×2): qty 8

## 2019-05-14 MED ORDER — HALOPERIDOL LACTATE 5 MG/ML IJ SOLN
2.0000 mg | Freq: Once | INTRAMUSCULAR | Status: AC
  Administered 2019-05-14: 21:00:00 2 mg via INTRAVENOUS
  Filled 2019-05-14: qty 1

## 2019-05-14 MED ORDER — DM-GUAIFENESIN ER 30-600 MG PO TB12: 1.0000 | ORAL_TABLET | Freq: Two times a day (BID) | ORAL | Status: DC | PRN

## 2019-05-14 MED ORDER — LORATADINE 10 MG PO TABS
10.0000 mg | ORAL_TABLET | Freq: Every day | ORAL | Status: DC
  Administered 2019-05-17 – 2019-05-23 (×7): 10 mg via ORAL
  Filled 2019-05-14 (×7): qty 1

## 2019-05-14 MED ORDER — METOPROLOL SUCCINATE ER 25 MG PO TB24: 12.5000 mg | ORAL_TABLET | Freq: Every day | ORAL | Status: DC

## 2019-05-14 MED ORDER — SALINE SPRAY 0.65 % NA SOLN: 2.0000 | Freq: Two times a day (BID) | NASAL | Status: DC | PRN

## 2019-05-14 MED ORDER — ENOXAPARIN SODIUM 40 MG/0.4ML ~~LOC~~ SOLN
40.0000 mg | Freq: Every day | SUBCUTANEOUS | Status: DC
  Administered 2019-05-15 – 2019-05-17 (×3): 40 mg via SUBCUTANEOUS
  Filled 2019-05-14 (×2): qty 0.4

## 2019-05-14 MED ORDER — VENLAFAXINE HCL ER 150 MG PO CP24
300.0000 mg | ORAL_CAPSULE | Freq: Every day | ORAL | Status: DC
  Filled 2019-05-14: qty 2

## 2019-05-14 MED ORDER — PRAVASTATIN SODIUM 20 MG PO TABS: 40.0000 mg | ORAL_TABLET | Freq: Every day | ORAL | Status: DC

## 2019-05-14 MED ORDER — OMEGA-3-ACID ETHYL ESTERS 1 G PO CAPS
1.0000 g | ORAL_CAPSULE | Freq: Every day | ORAL | Status: DC
  Administered 2019-05-17 – 2019-05-23 (×7): 1 g via ORAL
  Filled 2019-05-14 (×10): qty 1

## 2019-05-14 MED ORDER — OXYCODONE-ACETAMINOPHEN 10-325 MG PO TABS: 1.0000 | ORAL_TABLET | Freq: Three times a day (TID) | ORAL | Status: DC | PRN

## 2019-05-14 MED ORDER — OXYCODONE HCL 5 MG PO TABS
5.0000 mg | ORAL_TABLET | Freq: Three times a day (TID) | ORAL | Status: DC | PRN
  Administered 2019-05-18: 5 mg via ORAL
  Filled 2019-05-14: qty 1

## 2019-05-14 MED ORDER — HALOPERIDOL LACTATE 5 MG/ML IJ SOLN
2.0000 mg | Freq: Once | INTRAMUSCULAR | Status: AC
  Administered 2019-05-14: 22:00:00 2 mg via INTRAVENOUS
  Filled 2019-05-14: qty 1

## 2019-05-14 MED ORDER — VITAMIN C 500 MG PO TABS
3000.0000 mg | ORAL_TABLET | Freq: Every day | ORAL | Status: DC
  Administered 2019-05-17 – 2019-05-23 (×7): 3000 mg via ORAL
  Filled 2019-05-14 (×8): qty 6

## 2019-05-14 MED ORDER — ADULT MULTIVITAMIN LIQUID CH
5.0000 mL | Freq: Every day | ORAL | Status: DC
  Filled 2019-05-14: qty 15

## 2019-05-14 MED ORDER — IPRATROPIUM-ALBUTEROL 0.5-2.5 (3) MG/3ML IN SOLN
3.0000 mL | Freq: Four times a day (QID) | RESPIRATORY_TRACT | Status: DC
  Administered 2019-05-15 – 2019-05-17 (×8): 3 mL via RESPIRATORY_TRACT
  Filled 2019-05-14 (×9): qty 3

## 2019-05-14 MED ORDER — ONDANSETRON HCL 4 MG/2ML IJ SOLN: 4.0000 mg | Freq: Three times a day (TID) | INTRAMUSCULAR | Status: DC | PRN

## 2019-05-14 MED ORDER — IPRATROPIUM-ALBUTEROL 0.5-2.5 (3) MG/3ML IN SOLN
3.0000 mL | RESPIRATORY_TRACT | Status: DC
  Administered 2019-05-14: 22:00:00 3 mL via RESPIRATORY_TRACT
  Filled 2019-05-14: qty 3

## 2019-05-14 MED ORDER — SENNA 8.6 MG PO TABS
2.0000 | ORAL_TABLET | Freq: Every day | ORAL | Status: DC
  Administered 2019-05-17 – 2019-05-22 (×5): 17.2 mg via ORAL
  Filled 2019-05-14 (×5): qty 2

## 2019-05-14 MED ORDER — ACETAMINOPHEN 325 MG PO TABS: 650.0000 mg | ORAL_TABLET | Freq: Four times a day (QID) | ORAL | Status: DC | PRN

## 2019-05-14 MED ORDER — SODIUM FLUORIDE 1.1 % DT CREA: TOPICAL_CREAM | Freq: Every day | DENTAL | Status: DC

## 2019-05-14 MED ORDER — PRO-STAT SUGAR FREE PO LIQD: 30.0000 mL | Freq: Two times a day (BID) | ORAL | Status: DC

## 2019-05-14 MED ORDER — FUROSEMIDE 10 MG/ML IJ SOLN
80.0000 mg | Freq: Once | INTRAMUSCULAR | Status: AC
  Administered 2019-05-14: 80 mg via INTRAVENOUS
  Filled 2019-05-14 (×2): qty 8

## 2019-05-14 MED ORDER — FLUTICASONE FUROATE-VILANTEROL 100-25 MCG/INH IN AEPB
1.0000 | INHALATION_SPRAY | Freq: Every day | RESPIRATORY_TRACT | Status: DC
  Administered 2019-05-16 – 2019-05-23 (×8): 1 via RESPIRATORY_TRACT
  Filled 2019-05-14: qty 28

## 2019-05-14 MED ORDER — VITAMIN D3 25 MCG (1000 UNIT) PO TABS
1000.0000 [IU] | ORAL_TABLET | Freq: Every day | ORAL | Status: DC
  Administered 2019-05-17 – 2019-05-23 (×7): 1000 [IU] via ORAL
  Filled 2019-05-14 (×7): qty 1

## 2019-05-14 MED ORDER — FLUTICASONE PROPIONATE 50 MCG/ACT NA SUSP
1.0000 | Freq: Every day | NASAL | Status: DC
  Administered 2019-05-16 – 2019-05-22 (×7): 1 via NASAL
  Filled 2019-05-14 (×2): qty 16

## 2019-05-14 MED ORDER — HYDRALAZINE HCL 20 MG/ML IJ SOLN: 5.0000 mg | INTRAMUSCULAR | Status: DC | PRN

## 2019-05-14 MED ORDER — ALBUTEROL SULFATE (2.5 MG/3ML) 0.083% IN NEBU: 2.5000 mg | INHALATION_SOLUTION | RESPIRATORY_TRACT | Status: DC | PRN

## 2019-05-14 MED ORDER — OXYCODONE-ACETAMINOPHEN 5-325 MG PO TABS
1.0000 | ORAL_TABLET | Freq: Three times a day (TID) | ORAL | Status: DC | PRN
  Administered 2019-05-16 – 2019-05-18 (×3): 1 via ORAL
  Filled 2019-05-14 (×4): qty 1

## 2019-05-14 MED ORDER — LAMOTRIGINE 25 MG PO TABS: 25.0000 mg | ORAL_TABLET | Freq: Every day | ORAL | Status: DC

## 2019-05-14 MED ORDER — POLYETHYLENE GLYCOL 3350 17 G PO PACK: 17.0000 g | PACK | Freq: Every day | ORAL | Status: DC | PRN

## 2019-05-14 NOTE — ED Notes (Signed)
Attempted to call report to ICU. Spoke with charge and was told she would notify the RN to call me back.

## 2019-05-14 NOTE — H&P (Addendum)
History and Physical    Kathleen Roy EXH:[id number] DOB: 04/10/1961 DOA: 05/14/2019  Referring MD/NP/PA:   PCP: [name], MD   Patient coming from:  The patient is coming from home.  At baseline, pt is independent for most of ADL.        Chief Complaint: SOB and and oxygen desaturation  HPI: Kathleen Roy is a 58 y.o. female with medical history significant of COPD, chronic respiratory failure on 4 L nasal cannula oxygen, HTN, HLD, diet-controled DM, dCHF, morbid obesity, OSA, hypoventilation syndrome, migraine headache, fibromyalgia, memory loss, who presents with SOB and oxygen desaturation.  Per report, pt was found to have oxygen desaturation to low 80s on baseline 4 L nasal cannula oxygen in the facility. Pt is placed on NRB at 15 L O2- 95%. She has hx of memory loss. She is mildly confused. She response to calling her name, but is not oriented to place and time.  She does not complain of any pain.  Does not seem to have chest pain or abdominal pain.  No active cough, nausea, vomiting, diarrhea noted. Not sure if pt has symptoms of UTI.  She moves all extremities on painful stimuli.  No facial droop.  ED Course: pt was found to have BNP 36.9, negative COVID-19 test, pending WBC, negative trop, renal function normal, temperature normal, heart rate in 90s, RR 25, oxygen 95% on BiPAP. VBG with pH 7.301, CO2 118, O2 49.1.  Patient is admitted to stepdown bed as inpatient.  # CXR showed:  Cardiomegaly. Diffuse lung density, similar to previous studies but possibly slightly more pronounced. This could represent mild edema superimposed upon chronic lung disease  Review of Systems: Could not be reviewed accurately due to memory loss and confusion  Allergy:  Allergies  Allergen Reactions  . Aspirin Hives    Past Medical History:  Diagnosis Date  . Arthritis    "I feel like it's everywhere" (09/24/2017  . Cellulitis and abscess of lower extremity 08/16/2016   right leg  . CHF  (congestive heart failure) ([place])   . Chronic lower back pain   . COPD (chronic obstructive pulmonary disease) ([place])   . Depression   . Fibromyalgia   . Headache    "once q couple months now" (09/24/2017)  . Hyperlipidemia   . Hypertension   . HYPERTENSION, BENIGN SYSTEMIC 02/12/2007  . Memory disturbance   . Migraines 1970's - <2000   "they just went away"  . Neuropathy    "bad in my right hand and in both feet" (09/24/2017)  . Normal echocardiogram 05/30/05   suboptimal study  . Obesity, morbid (more than 100 lbs over ideal weight or BMI > 40) (HCC)    obese since childhood  . On home oxygen therapy    "4L; 24/7" (09/24/2017)  . Osteoarthritis   . Peripheral edema   . Pressure ulcer of foot    left  . Sepsis ([place])   . Shortness of breath dyspnea   . Sleep apnea 10/2014   "couldn't tolerate the mask" (09/24/2017)  . Type II diabetes mellitus ([place])     Past Surgical History:  Procedure Laterality Date  . ABDOMINAL HERNIA REPAIR  2002  . APPENDECTOMY  1995  . CHOLECYSTECTOMY OPEN  1995  . HERNIA REPAIR    . TOTAL ABDOMINAL HYSTERECTOMY  1994    Social History:  reports that she quit smoking about 10 years ago. Her smoking use included cigarettes. She has a 30.00 pack-year smoking  history. She has never used smokeless tobacco. She reports that she does not drink alcohol or use drugs.  Family History:  Family History  Problem Relation Age of Onset  . Stroke Mother   . Dementia Father   . Prostate cancer Father   . Congestive Heart Failure Brother   . Diabetes Brother   . Hypertension Brother      Prior to Admission medications   Medication Sig Start Date End Date Taking? Authorizing Provider  Amino Acids-Protein Hydrolys (FEEDING SUPPLEMENT, PRO-STAT SUGAR FREE 64,) LIQD Take 30 mLs by mouth 2 (two) times daily.   Yes [provider]  Ascorbic Acid (VITAMIN C) 1000 MG tablet Take 3,000 mg daily by mouth.    Yes [provider]  Cholecalciferol  (VITAMIN D3 PO) Take 1,000 Units by mouth daily.   Yes [provider]  fexofenadine (ALLEGRA) 180 MG tablet Take 180 mg by mouth daily.   Yes [provider]  fluticasone (FLONASE) 50 MCG/ACT nasal spray Place 1 spray into both nostrils daily.   Yes [provider]  fluticasone furoate-vilanterol (BREO ELLIPTA) 100-25 MCG/INH AEPB Inhale 1 puff daily into the lungs.   Yes [provider]  lamoTRIgine (LAMICTAL) 25 MG tablet Take 25 mg by mouth daily.   Yes [provider]  metoprolol succinate (TOPROL-XL) 25 MG 24 hr tablet Take 12.5 mg by mouth daily.   Yes [provider]  Multiple Vitamin (MULTIVITAMIN) LIQD Take 5 mLs by mouth daily.   Yes [provider]  Omega-3 Fatty Acids (FISH OIL) 1000 MG CAPS Take 2 capsules (2,000 mg total) by mouth at bedtime. 06/10/16  Yes Mayo, Pete Pelt, MD  oxyCODONE-acetaminophen (PERCOCET) 10-325 MG tablet Take one tablet by mouth every 6 hours as needed for pain Patient taking differently: Take 1 tablet by mouth 3 (three) times daily.  09/23/17  Yes Mayo, Pete Pelt, MD  OXYGEN Inhale 4 L into the lungs continuous.    Yes [provider]  potassium chloride (K-DUR,KLOR-CON) 10 MEQ tablet Take 40 mEq by mouth at bedtime.   Yes [provider]  potassium chloride SA (K-DUR,KLOR-CON) 20 MEQ tablet Take 2 tablets once a day. Patient taking differently: Take 40 mEq by mouth at bedtime.  03/04/17  Yes Mayo, Pete Pelt, MD  pravastatin (PRAVACHOL) 40 MG tablet Take 40 mg by mouth at bedtime.   Yes [provider]  senna (SENOKOT) 8.6 MG TABS tablet Take 2 tablets by mouth at bedtime.   Yes [provider]  sodium chloride (OCEAN) 0.65 % SOLN nasal spray Place 2 sprays into both nostrils 2 (two) times daily as needed for congestion (nose bleed/dryness).   Yes [provider]  Sodium Fluoride (PREVIDENT 5000 PLUS DT) Place 1 application onto teeth daily.   Yes [provider]  torsemide (DEMADEX) 20 MG tablet Take 40 mg by mouth 2 (two) times daily.   Yes [provider]  venlafaxine XR (EFFEXOR-XR) 150 MG 24 hr capsule Take 300 mg by mouth daily. Along with 37.5 to equal 337.5   Yes [provider]  albuterol (PROVENTIL HFA;VENTOLIN HFA) 108 (90 Base) MCG/ACT inhaler Inhale 2 puffs into the lungs every 4 (four) hours as needed for wheezing or shortness of breath.    [provider]  amoxicillin-clavulanate (AUGMENTIN) 875-125 MG tablet Take 1 tablet by mouth 2 (two) times daily. For 7days 01/10/19   [provider]  ARIPiprazole (ABILIFY) 2 MG tablet Take 1 tablet (2 mg  total) by mouth every other day. Patient not taking: Reported on 01/10/2019 12/18/16   MayoPete Pelt, MD  atorvastatin (LIPITOR) 10 MG tablet take 1 tablet by mouth at bedtime Patient not taking: Reported on 01/10/2019 11/21/17   Mayo, Pete Pelt, MD  cephALEXin (KEFLEX) 500 MG capsule Take 1 capsule (500 mg total) by mouth 4 (four) times daily. Patient not taking: Reported on 11/16/2017 11/06/17   Ocie Cornfield T, PA-C  cetirizine (ZYRTEC) 10 MG tablet Take 1 tablet (10 mg total) by mouth daily. Patient not taking: Reported on 01/10/2019 08/15/17   Mayo, Pete Pelt, MD  diclofenac sodium (VOLTAREN) 1 % GEL Apply 2 g 4 (four) times daily topically. Patient not taking: Reported on 05/14/2019 11/01/17   Albesa Seen, PA-C  Multiple Vitamins-Minerals (CERTAGEN PO) Take 1 tablet by mouth daily.    [provider]  nystatin cream (MYCOSTATIN) Apply to affected area 2 times daily Patient not taking: Reported on 01/10/2019 11/19/17   Muthersbaugh, Jarrett Soho, PA-C  polyethylene glycol (MIRALAX / GLYCOLAX) packet Take 17 g by mouth daily. Mix in 8 oz liquid and drink    [provider]  predniSONE (DELTASONE) 20 MG tablet Take 40 mg by mouth daily. For 4 days 01/10/19   [provider]  pregabalin (LYRICA) 150 MG capsule Take 1 capsule (150  mg total) by mouth 2 (two) times daily. Patient not taking: Reported on 05/14/2019 07/28/17   Gerlene Fee, NP  trazodone (DESYREL) 300 MG tablet Take 1 tablet (300 mg total) by mouth at bedtime. Patient not taking: Reported on 01/10/2019 10/07/17   Sela Hua, MD  venlafaxine XR (EFFEXOR-XR) 37.5 MG 24 hr capsule Take 37.5 mg by mouth daily. Along with 300mg  to equal 337.5mg     [provider]    Physical Exam: Vitals:   05/14/19 2000 05/14/19 2015 05/14/19 2025 05/14/19 2102  BP: (!) 136/115  (!) 160/148 107/64  Pulse:  97 98 96  Resp: (!) 21 14 18 16   Temp:      TempSrc:      SpO2:  (!) 71% 96% 94%   General: Not in acute distress. Morbid obesity. HEENT:       Eyes: PERRL, EOMI, no scleral icterus.       ENT: No discharge from the ears and nose, no pharynx injection, no tonsillar enlargement.        Neck: Difficult to assess JVD due to morbid obesity, no bruit, no mass felt. Heme: No neck lymph node enlargement. Cardiac: S1/S2, RRR, No murmurs, No gallops or rubs. Respiratory: decreased air movement bilaterally. No rales, wheezing, rhonchi or rubs. GI: Soft, nondistended, nontender, no organomegaly, BS present. GU: No hematuria Ext: has trace leg edema bilaterally. 2+DP/PT pulse bilaterally. Musculoskeletal: No joint deformities, No joint redness or warmth, no limitation of ROM in spin. Skin: No rashes.  Neuro: confused, not oriented to place and time, knows her own name. Cranial nerves II-XII grossly intact, moves all extremities upon painful stimuli Psych: Patient is not psychotic, no suicidal or hemocidal ideation.  Labs on Admission: I have personally reviewed following labs and imaging studies  CBC: Recent Labs  Lab 05/14/19 1802  WBC 6.4  NEUTROABS 4.7  HGB 9.3*  HCT 34.7*  MCV 107.8*  PLT 053   Basic Metabolic Panel: Recent Labs  Lab 05/14/19 1802  NA 148*  K 3.9  CL 86*  CO2 >50*  GLUCOSE 137*  BUN 13  CREATININE 0.68  CALCIUM 9.3  GFR: CrCl cannot be calculated (Unknown ideal weight.). Liver Function Tests: Recent Labs  Lab 05/14/19 1802  AST 17  ALT 16  ALKPHOS 87  BILITOT 0.5  PROT 7.3  ALBUMIN 3.4*   No results for input(s): LIPASE, AMYLASE in the last 168 hours. No results for input(s): AMMONIA in the last 168 hours. Coagulation Profile: No results for input(s): INR, PROTIME in the last 168 hours. Cardiac Enzymes: Recent Labs  Lab 05/14/19 1802  TROPONINI <0.03   BNP (last 3 results) No results for input(s): PROBNP in the last 8760 hours. HbA1C: No results for input(s): HGBA1C in the last 72 hours. CBG: No results for input(s): GLUCAP in the last 168 hours. Lipid Profile: No results for input(s): CHOL, HDL, LDLCALC, TRIG, CHOLHDL, LDLDIRECT in the last 72 hours. Thyroid Function Tests: No results for input(s): TSH, T4TOTAL, FREET4, T3FREE, THYROIDAB in the last 72 hours. Anemia Panel: No results for input(s): VITAMINB12, FOLATE, FERRITIN, TIBC, IRON, RETICCTPCT in the last 72 hours. Urine analysis:    Component Value Date/Time   COLORURINE YELLOW 11/18/2017 [place] 11/18/2017 2355   LABSPEC 1.013 11/18/2017 2355   PHURINE 5.0 11/18/2017 2355   GLUCOSEU NEGATIVE 11/18/2017 2355   HGBUR NEGATIVE 11/18/2017 2355   BILIRUBINUR NEGATIVE 11/18/2017 2355   KETONESUR NEGATIVE 11/18/2017 2355   PROTEINUR NEGATIVE 11/18/2017 2355   UROBILINOGEN 0.2 04/11/2013 0749   NITRITE NEGATIVE 11/18/2017 2355   LEUKOCYTESUR NEGATIVE 11/18/2017 2355   Sepsis Labs: @LABRCNTIP (procalcitonin:4,lacticidven:4) ) Recent Results (from the past 240 hour(s))  SARS Coronavirus 2 (CEPHEID- Performed in [facility] hospital lab), Hosp Order     Status: None   Collection Time: 05/14/19  6:22 PM  Result Value Ref Range Status   SARS Coronavirus 2 NEGATIVE NEGATIVE Final    Comment: (NOTE) If result is NEGATIVE SARS-CoV-2 target nucleic acids are NOT DETECTED. The SARS-CoV-2 RNA is generally  detectable in upper and lower  respiratory specimens during the acute phase of infection. The lowest  concentration of SARS-CoV-2 viral copies this assay can detect is 250  copies / mL. A negative result does not preclude SARS-CoV-2 infection  and should not be used as the sole basis for treatment or other  patient management decisions.  A negative result may occur with  improper specimen collection / handling, submission of specimen other  than nasopharyngeal swab, presence of viral mutation(s) within the  areas targeted by this assay, and inadequate number of viral copies  (<250 copies / mL). A negative result must be combined with clinical  observations, patient history, and epidemiological information. If result is POSITIVE SARS-CoV-2 target nucleic acids are DETECTED. The SARS-CoV-2 RNA is generally detectable in upper and lower  respiratory specimens dur ing the acute phase of infection.  Positive  results are indicative of active infection with SARS-CoV-2.  Clinical  correlation with patient history and other diagnostic information is  necessary to determine patient infection status.  Positive results do  not rule out bacterial infection or co-infection with other viruses. If result is PRESUMPTIVE POSTIVE SARS-CoV-2 nucleic acids MAY BE PRESENT.   A presumptive positive result was obtained on the submitted specimen  and confirmed on repeat testing.  While 2019 novel coronavirus  (SARS-CoV-2) nucleic acids may be present in the submitted sample  additional confirmatory testing may be necessary for epidemiological  and / or clinical management purposes  to differentiate between  SARS-CoV-2 and other Sarbecovirus currently known to infect humans.  If clinically indicated additional testing  with an alternate test  methodology [phone]) is advised. The SARS-CoV-2 RNA is generally  detectable in upper and lower respiratory sp ecimens during the acute  phase of infection. The  expected result is Negative. Fact Sheet for Patients:  StrictlyIdeas.no Fact Sheet for Healthcare Providers: BankingDealers.co.za This test is not yet approved or cleared by the [place] FDA and has been authorized for detection and/or diagnosis of SARS-CoV-2 by FDA under an Emergency Use Authorization (EUA).  This EUA will remain in effect (meaning this test can be used) for the duration of the COVID-19 declaration under Section 564(b)(1) of the Act, 21 U.S.C. section 360bbb-3(b)(1), unless the authorization is terminated or revoked sooner. Performed at [facility], [place] [address], [place], [place] [address]      Radiological Exams on Admission: Dg Chest Portable 1 View  Result Date: 05/14/2019 CLINICAL DATA:  Low oxygen saturation. EXAM: PORTABLE CHEST 1 VIEW COMPARISON:  01/10/2019 and multiple previous FINDINGS: The patient is rotated towards the right as usual. Cardiac silhouette is enlarged. There is diffuse pulmonary density which appear slightly more prominent than seen previously. This could reflect pulmonary edema superimposed on chronic lung disease. No dense consolidation or lobar collapse. IMPRESSION: Rotated. Cardiomegaly. Diffuse lung density, similar to previous studies but possibly slightly more pronounced. This could represent mild edema superimposed upon chronic lung disease. Electronically Signed   By: Nelson Chimes M.D.   On: 05/14/2019 19:11     EKG: Independently reviewed.  Sinus rhythm, QTC 495, bifascicular block which is not new, poor R wave progression  Assessment/Plan Principal Problem:   Acute on chronic respiratory failure with hypoxia and hypercapnia (HCC) Active Problems:   Major depressive disorder, recurrent episode (HCC)   COPD (chronic obstructive pulmonary disease) (HCC)   OSA (obstructive sleep apnea)   Obesity hypoventilation syndrome (HCC)   Chronic diastolic congestive heart  failure (HCC)   Essential hypertension   Diabetes mellitus without complication (HCC)   Macrocytic anemia   Acute metabolic encephalopathy   Acute on chronic respiratory failure with hypoxia and hypercapnia ([place]): Etiology is not clear.  Likely due to multifactorial etiology, including possible COPD exacerbation, CHF exacerbation in the setting of OSA and hypoventilation syndrome.  Patient is morbid obesity, she has decreased air movement of auscultation, but not hearing wheezing or rhonchi.  Is very difficult to assess her volume status due to morbid obesity.  Her BNP 36.9 which could be falsely low due to obesity.  Chest x-ray showed possible mild pulmonary edema, indicating possible CHF exacerbation. Will try IV lasix and bronchodilators, hoping she will improve.  Another DD is PE. If not improving, may need to pursue work up to r/o PE.   -will admit patient to SDU as inpt -Nebulizers: scheduled Atrovent and prn Xopenex Nebs -continue home Breo Ellipta inhaler -Mucinex for cough  -Incentive spirometry -Follow up sputum culture, respiratory virus panel, Flu pcr - on BiPAP -Nasal cannula oxygen as needed to maintain O2 saturation 93% or greater   COPD (chronic obstructive pulmonary disease): pt may has mild COPD exacerbation -see above  Chronic diastolic congestive heart failure: 2D echo on 08/20/2018 showed EF 60 to 65% with grade 2 diastolic dysfunction.  Patient has trace leg edema, chest x-ray with possible mild pulmonary edema, indicating possible CHF exacerbation.  Patient is on torsemide 40 mg twice daily at home. -Will try IV Lasix 80 mg twice daily  Major depressive disorder, recurrent episode ([place]): -Effexor  OSA (obstructive sleep apnea) and  Obesity  hypoventilation syndrome ([place]): -started BiPAP  Essential hypertension: -continue metoprolol - IV Lasix - PRN hydralazine IV  Diet controled Diabetes mellitus without complication ([place]): Last A1c4.9 on 09/20/17, well  controled. Blood sugar 137  -check CBG qAM  Macrocytic anemia: Hgb stable. Hgb 9.9 on 01/10/19-->9.3. -f/u by CBC  Acute metabolic encephalopathy and hx of memory loss: Likely due to hypoxia and hypercapnia.  No obvious focal neurologic findings on physical examination. -Frequent neuro check   Inpatient status:  # Patient requires inpatient status due to high intensity of service, high risk for further deterioration and high frequency of surveillance required.  I certify that at the point of admission it is my clinical judgment that the patient will require inpatient hospital care spanning beyond 2 midnights from the point of admission.  . This patient has multiple chronic comorbidities including COPD, chronic respiratory failure on 4 L nasal cannula oxygen, HTN, HLD, diet-controled DM, dCHF, morbid obesity, OSA, hypoventilation syndrome, migraine headache, fibromyalgia, memory loss . Now patient has presenting with SOB and acute on chronic respiratory failure with hypoxia and hypercapnia. . The worrisome physical exam findings include morbid obesity, mild leg edema, confusion, decreased air movement bilaterally . The initial radiographic and laboratory data are worrisome because of possible mild pulmonary edema on chest x-ray, hypercapnic on ABG. . Current medical needs: please see my assessment and plan . Predictability of an adverse outcome (risk): Patient has multiple comorbidities, now presents with SOB and acute on chronic respiratory failure with hypoxia and hypercapnia.  Etiology is not completely clear, but possibly due to multifactorial etiology, including CHF and COPD exacerbation in the setting of OSA/hypoventilation syndrome.  Patient will be treated with bronchodilators and IV Lasix.  Given her multiple comorbidities, patient is at high risk of deteriorating.  Will need to be treated in hospital for at least 2 days.       DVT ppx: SQ Lovenox Code Status: DNR (pt has golden  card paper with DNR from facility) Family Communication: None at bed side.    Disposition Plan:  Anticipate discharge back to previous home environment Consults called:  none Admission status:  SDU/inpation       Date of Service 05/14/2019    [facility]   If 7PM-7AM, please contact night-coverage [email or web address] Password [facility] 05/14/2019, 9:37 PM

## 2019-05-14 NOTE — ED Triage Notes (Signed)
Pt BIBA from [facility] ALF d/t O2 sats being in the low 80's on 4 L O2 by [place] (pts baseline).  Pt placed on NRB at 15 L O2- 95%.    Pt has hx of COPD. AOx4.

## 2019-05-14 NOTE — ED Provider Notes (Signed)
[facility] DEPT Provider Note   CSN: [id number] Arrival date & time: 05/14/19  1737    History   Chief Complaint No chief complaint on file.   HPI Kathleen Roy is a 58 y.o. female.     58 year old female with extensive past medical history including memory problems, morbid obesity, COPD on 4 L O2 chronically, CHF, hypertension, hyperlipidemia, OSA, T2DM who p/w hypoxia.  Nursing facility called EMS today due to low oxygen saturations on her home oxygen level.  She was noted to be in the low 80s on 4 L when EMS arrived and they placed her on a nonrebreather at 15 L with improvement in saturations to the 90s.  LEVEL 5 CAVEAT DUE TO DEMENTIA  The history is provided by the EMS personnel and the nursing home.    Past Medical History:  Diagnosis Date  . Arthritis    "I feel like it's everywhere" (09/24/2017  . Cellulitis and abscess of lower extremity 08/16/2016   right leg  . CHF (congestive heart failure) ([place])   . Chronic lower back pain   . COPD (chronic obstructive pulmonary disease) ([place])   . Depression   . Fibromyalgia   . Headache    "once q couple months now" (09/24/2017)  . Hyperlipidemia   . Hypertension   . HYPERTENSION, BENIGN SYSTEMIC 02/12/2007  . Memory disturbance   . Migraines 1970's - <2000   "they just went away"  . Neuropathy    "bad in my right hand and in both feet" (09/24/2017)  . Normal echocardiogram 05/30/05   suboptimal study  . Obesity, morbid (more than 100 lbs over ideal weight or BMI > 40) (HCC)    obese since childhood  . On home oxygen therapy    "4L; 24/7" (09/24/2017)  . Osteoarthritis   . Peripheral edema   . Pressure ulcer of foot    left  . Sepsis ([place])   . Shortness of breath dyspnea   . Sleep apnea 10/2014   "couldn't tolerate the mask" (09/24/2017)  . Type II diabetes mellitus [facility])     Patient Active Problem List   Diagnosis Date Noted  . Acute on chronic respiratory failure with  hypoxia and hypercapnia ([place]) 05/14/2019  . Diabetes mellitus without complication ([place]) 09/32/6712  . Macrocytic anemia 05/14/2019  . Candidal dermatitis 09/25/2017  . Requires daily assistance for activities of daily living (ADL) and comfort needs   . Weakness 09/24/2017  . Other fatigue   . Altered mental status 09/20/2017  . 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 Allergic rhinitis 08/15/2017  . Immobility 08/15/2017  . Fibromyalgia 06/16/2017  . Chronic pain of both knees   . Acute respiratory failure with hypercapnia ([place]) 10/15/2016  . Type 2 diabetes mellitus ([place]) 02/14/2016  . Chronic diastolic congestive heart failure ([place])   . Essential hypertension   . OSA (obstructive sleep apnea)   . Obesity hypoventilation syndrome ([place])   . Chronic respiratory failure with hypoxia ([place])   . Hyperglycemia 11/08/2014  . Memory loss 03/18/2014  . Venous stasis dermatitis of both lower extremities 12/02/2012  . COPD (chronic obstructive pulmonary disease) ([place]) 10/15/2011  . Peripheral neuropathy ([place]) 10/15/2011  . BACK PAIN, LUMBAR, CHRONIC 01/22/2011  . Morbid obesity with BMI of 70 and over, adult ([place]) 11/16/2007  . Major depressive disorder, recurrent episode ([place]) 02/12/2007    Past Surgical History:  Procedure Laterality Date  . ABDOMINAL HERNIA REPAIR  2002  . APPENDECTOMY  1995  .  CHOLECYSTECTOMY OPEN  1995  . HERNIA REPAIR    . TOTAL ABDOMINAL HYSTERECTOMY  1994     OB History    Gravida  0   Para  0   Term  0   Preterm  0   AB  0   Living  0     SAB  0   TAB  0   Ectopic  0   Multiple  0   Live Births               Home Medications    Prior to Admission medications   Medication Sig Start Date End Date Taking? Authorizing Provider  Amino Acids-Protein Hydrolys (FEEDING SUPPLEMENT, PRO-STAT SUGAR FREE 64,) LIQD Take 30 mLs by mouth 2 (two) times daily.   Yes [provider]  Ascorbic Acid (VITAMIN C) 1000 MG tablet Take 3,000 mg daily by mouth.     Yes [provider]  Cholecalciferol (VITAMIN D3 PO) Take 1,000 Units by mouth daily.   Yes [provider]  fexofenadine (ALLEGRA) 180 MG tablet Take 180 mg by mouth daily.   Yes [provider]  fluticasone (FLONASE) 50 MCG/ACT nasal spray Place 1 spray into both nostrils daily.   Yes [provider]  fluticasone furoate-vilanterol (BREO ELLIPTA) 100-25 MCG/INH AEPB Inhale 1 puff daily into the lungs.   Yes [provider]  lamoTRIgine (LAMICTAL) 25 MG tablet Take 25 mg by mouth daily.   Yes [provider]  metoprolol succinate (TOPROL-XL) 25 MG 24 hr tablet Take 12.5 mg by mouth daily.   Yes [provider]  Multiple Vitamin (MULTIVITAMIN) LIQD Take 5 mLs by mouth daily.   Yes [provider]  Omega-3 Fatty Acids (FISH OIL) 1000 MG CAPS Take 2 capsules (2,000 mg total) by mouth at bedtime. 06/10/16  Yes Mayo, Pete Pelt, MD  oxyCODONE-acetaminophen (PERCOCET) 10-325 MG tablet Take one tablet by mouth every 6 hours as needed for pain Patient taking differently: Take 1 tablet by mouth 3 (three) times daily.  09/23/17  Yes Mayo, Pete Pelt, MD  OXYGEN Inhale 4 L into the lungs continuous.    Yes [provider]  potassium chloride (K-DUR,KLOR-CON) 10 MEQ tablet Take 40 mEq by mouth at bedtime.   Yes [provider]  potassium chloride SA (K-DUR,KLOR-CON) 20 MEQ tablet Take 2 tablets once a day. Patient taking differently: Take 40 mEq by mouth at bedtime.  03/04/17  Yes Mayo, Pete Pelt, MD  pravastatin (PRAVACHOL) 40 MG tablet Take 40 mg by mouth at bedtime.   Yes [provider]  senna (SENOKOT) 8.6 MG TABS tablet Take 2 tablets by mouth at bedtime.   Yes [provider]  sodium chloride (OCEAN) 0.65 % SOLN nasal spray Place 2 sprays into both nostrils 2 (two) times daily as needed for congestion (nose bleed/dryness).   Yes [provider]  Sodium Fluoride (PREVIDENT 5000 PLUS DT)  Place 1 application onto teeth daily.   Yes [provider]  torsemide (DEMADEX) 20 MG tablet Take 40 mg by mouth 2 (two) times daily.   Yes [provider]  venlafaxine XR (EFFEXOR-XR) 150 MG 24 hr capsule Take 300 mg by mouth daily. Along with 37.5 to equal 337.5   Yes [provider]  albuterol (PROVENTIL HFA;VENTOLIN HFA) 108 (90 Base) MCG/ACT inhaler Inhale 2 puffs into the lungs every 4 (four) hours as needed for wheezing or shortness of breath.    [provider]  amoxicillin-clavulanate (  AUGMENTIN) 875-125 MG tablet Take 1 tablet by mouth 2 (two) times daily. For 7days 01/10/19   [provider]  ARIPiprazole (ABILIFY) 2 MG tablet Take 1 tablet (2 mg total) by mouth every other day. Patient not taking: Reported on 01/10/2019 12/18/16   MayoPete Pelt, MD  atorvastatin (LIPITOR) 10 MG tablet take 1 tablet by mouth at bedtime Patient not taking: Reported on 01/10/2019 11/21/17   Mayo, Pete Pelt, MD  cephALEXin (KEFLEX) 500 MG capsule Take 1 capsule (500 mg total) by mouth 4 (four) times daily. Patient not taking: Reported on 11/16/2017 11/06/17   Ocie Cornfield T, PA-C  cetirizine (ZYRTEC) 10 MG tablet Take 1 tablet (10 mg total) by mouth daily. Patient not taking: Reported on 01/10/2019 08/15/17   Mayo, Pete Pelt, MD  diclofenac sodium (VOLTAREN) 1 % GEL Apply 2 g 4 (four) times daily topically. Patient not taking: Reported on 05/14/2019 11/01/17   Albesa Seen, PA-C  Multiple Vitamins-Minerals (CERTAGEN PO) Take 1 tablet by mouth daily.    [provider]  nystatin cream (MYCOSTATIN) Apply to affected area 2 times daily Patient not taking: Reported on 01/10/2019 11/19/17   Muthersbaugh, Jarrett Soho, PA-C  polyethylene glycol (MIRALAX / GLYCOLAX) packet Take 17 g by mouth daily. Mix in 8 oz liquid and drink    [provider]  predniSONE (DELTASONE) 20 MG tablet Take 40 mg by mouth daily. For 4 days 01/10/19   [provider]   pregabalin (LYRICA) 150 MG capsule Take 1 capsule (150 mg total) by mouth 2 (two) times daily. Patient not taking: Reported on 05/14/2019 07/28/17   Gerlene Fee, NP  trazodone (DESYREL) 300 MG tablet Take 1 tablet (300 mg total) by mouth at bedtime. Patient not taking: Reported on 01/10/2019 10/07/17   Sela Hua, MD  venlafaxine XR (EFFEXOR-XR) 37.5 MG 24 hr capsule Take 37.5 mg by mouth daily. Along with 300mg  to equal 337.5mg     [provider]    Family History Family History  Problem Relation Age of Onset  . Stroke Mother   . Dementia Father   . Prostate cancer Father   . Congestive Heart Failure Brother   . Diabetes Brother   . Hypertension Brother     Social History Social History   Tobacco Use  . Smoking status: Former Smoker    Packs/day: 1.00    Years: 30.00    Pack years: 30.00    Types: Cigarettes    Last attempt to quit: 01/05/2009    Years since quitting: 10.3  . Smokeless tobacco: Never Used  Substance Use Topics  . Alcohol use: No    Alcohol/week: 0.0 standard drinks  . Drug use: No     Allergies   Aspirin   Review of Systems Review of Systems  Unable to perform ROS: Dementia     Physical Exam Updated Vital Signs BP (!) 160/148   Pulse 98   Temp 98.2 F (36.8 C) (Oral)   Resp 18   SpO2 96%   Physical Exam Vitals signs and nursing note reviewed.  Constitutional:      Appearance: She is well-developed. She is obese.     Comments: Chronically ill appearing but non-toxic, no distress, asking for glasses  HENT:     Head: Normocephalic and atraumatic.     Nose: Nose normal.  Eyes:     Conjunctiva/sclera: Conjunctivae normal.  Neck:     Musculoskeletal: Neck supple.  Cardiovascular:     Rate  and Rhythm: Normal rate and regular rhythm.     Heart sounds: Normal heart sounds. No murmur.  Pulmonary:     Effort: Pulmonary effort is normal.     Comments: Difficult to hear lung sounds 2/2 body habitus, no respiratory distress,  speaking in full sentences Abdominal:     General: There is no distension.     Palpations: Abdomen is soft.     Tenderness: There is no abdominal tenderness.  Musculoskeletal:     Right lower leg: Edema present.     Left lower leg: Edema present.  Skin:    General: Skin is warm and dry.  Neurological:     Mental Status: She is alert.     Comments: Oriented to person only      ED Treatments / Results  Labs (all labs ordered are listed, but only abnormal results are displayed) Labs Reviewed  COMPREHENSIVE METABOLIC PANEL - Abnormal; Notable for the following components:      Result Value   Sodium 148 (*)    Chloride 86 (*)    CO2 >50 (*)    Glucose, Bld 137 (*)    Albumin 3.4 (*)    All other components within normal limits  CBC WITH DIFFERENTIAL/PLATELET - Abnormal; Notable for the following components:   RBC 3.22 (*)    Hemoglobin 9.3 (*)    HCT 34.7 (*)    MCV 107.8 (*)    MCHC 26.8 (*)    RDW 17.7 (*)    All other components within normal limits  BLOOD GAS, VENOUS - Abnormal; Notable for the following components:   pCO2, Ven 118 (*)    pO2, Ven 49.8 (*)    Bicarbonate 56.8 (*)    Acid-Base Excess 26.5 (*)    All other components within normal limits  SARS CORONAVIRUS 2 (HOSPITAL ORDER, [facility])  LACTIC ACID, PLASMA  TROPONIN I  BRAIN NATRIURETIC PEPTIDE  LACTIC ACID, PLASMA  URINALYSIS, ROUTINE W REFLEX MICROSCOPIC    EKG EKG Interpretation  Date/Time:  Friday May 14 2019 19:43:12 EDT Ventricular Rate:  92 PR Interval:    QRS Duration: 143 QT Interval:  400 QTC Calculation: 495 R Axis:   -84 Text Interpretation:  Sinus rhythm RBBB and LAFB Probable left ventricular hypertrophy No significant change since last tracing Confirmed by Theotis Burrow [id number]) on 05/14/2019 8:40:47 PM   Radiology Dg Chest Portable 1 View  Result Date: 05/14/2019 CLINICAL DATA:  Low oxygen saturation. EXAM: PORTABLE CHEST 1 VIEW COMPARISON:   01/10/2019 and multiple previous FINDINGS: The patient is rotated towards the right as usual. Cardiac silhouette is enlarged. There is diffuse pulmonary density which appear slightly more prominent than seen previously. This could reflect pulmonary edema superimposed on chronic lung disease. No dense consolidation or lobar collapse. IMPRESSION: Rotated. Cardiomegaly. Diffuse lung density, similar to previous studies but possibly slightly more pronounced. This could represent mild edema superimposed upon chronic lung disease. Electronically Signed   By: Nelson Chimes M.D.   On: 05/14/2019 19:11    Procedures .Critical Care Performed by: Sharlett Iles, MD Authorized by: Sharlett Iles, MD   Critical care provider statement:    Critical care time (minutes):  30   Critical care time was exclusive of:  Separately billable procedures and treating other patients   Critical care was necessary to treat or prevent imminent or life-threatening deterioration of the following conditions:  Respiratory failure   Critical care was time spent personally  by me on the following activities:  Development of treatment plan with patient or surrogate, evaluation of patient's response to treatment, examination of patient, obtaining history from patient or surrogate, ordering and performing treatments and interventions, ordering and review of laboratory studies, ordering and review of radiographic studies, re-evaluation of patient's condition and review of old charts   (including critical care time)  Medications Ordered in ED Medications  haloperidol lactate (HALDOL) injection 2 mg (has no administration in time range)  furosemide (LASIX) injection 80 mg (has no administration in time range)     Initial Impression / Assessment and Plan / ED Course  I have reviewed the triage vital signs and the nursing notes.  Pertinent labs & imaging results that were available during my care of the patient were  reviewed by me and considered in my medical decision making (see chart for details).       PT alert and non-toxic on arrival, was 100% on NRB so switched to 6L [place] and maintained sats at [place]. Paperwork from nursing facility shows that she is DNR. Attempted to wean to 6L but eventually desaturated so placed on NRB.   Labs show CO2>50, normal Cr, normal BNP and trop. CXR with possible edema. Volume status is difficult to assess on exam due to body habitus. COVID testing negative, therefore ordered bipap as patient is severely hypercarbic on blood gas, pH 7.3 with pCO2 118. I suspect a component of obesity-hypoventilation. She does not have significant increased WOB or prolonged expiratory phase on exam, no obvious wheezing, so I held off on antibiotics. Gave 80mg  IV lasix, possible volume overload. No evidence of infection. WBC and UA are pending.  Discussed admission w/ [facility], Dr. Blaine Hamper, and pt admitted for further treatment.  Final Clinical Impressions(s) / ED Diagnoses   Final diagnoses:  Acute respiratory failure with hypoxia and hypercapnia [facility])    ED Discharge Orders    None       Little, Wenda Overland, MD 05/14/19 2046

## 2019-05-14 NOTE — ED Notes (Signed)
ED TO INPATIENT HANDOFF REPORT  ED Nurse Name and Phone #: Tomi Likens, RN  S Name/Age/Gender Kathleen Roy 58 y.o. female Room/Bed: WA18/WA18  Code Status   Code Status: DNR  Home/SNF/Other Skilled nursing facility Patient oriented to: self Is this baseline? No   Triage Complete: Triage complete  Chief Complaint Shortness of Breath  Triage Note Pt BIBA from [facility] ALF d/t O2 sats being in the low 80's on 4 L O2 by [place] (pts baseline).  Pt placed on NRB at 15 L O2- 95%.    Pt has hx of COPD. AOx4.     Allergies Allergies  Allergen Reactions  . Aspirin Hives    Level of Care/Admitting Diagnosis ED Disposition    ED Disposition Condition Comment   Admit  Hospital Area: [facility] [[id number]]  Level of Care: Stepdown [14]  Admit to SDU based on following criteria: Respiratory Distress:  Frequent assessment and/or intervention to maintain adequate ventilation/respiration, pulmonary toilet, and respiratory treatment.  Covid Evaluation: N/A  Diagnosis: Acute on chronic respiratory failure with hypoxia and hypercapnia [facility]) [[id number]]  Admitting Physician: Ivor Costa [[id number]]  Attending Physician: Ivor Costa [phone]  Estimated length of stay: past midnight tomorrow  Certification:: I certify this patient will need inpatient services for at least 2 midnights  PT Class (Do Not Modify): Inpatient [101]  PT Acc Code (Do Not Modify): Private [1]       B Medical/Surgery History Past Medical History:  Diagnosis Date  . Arthritis    "I feel like it's everywhere" (09/24/2017  . Cellulitis and abscess of lower extremity 08/16/2016   right leg  . CHF (congestive heart failure) ([place])   . Chronic lower back pain   . COPD (chronic obstructive pulmonary disease) ([place])   . Depression   . Fibromyalgia   . Headache    "once q couple months now" (09/24/2017)  . Hyperlipidemia   . Hypertension   . HYPERTENSION, BENIGN SYSTEMIC 02/12/2007  . Memory disturbance   .  Migraines 1970's - <2000   "they just went away"  . Neuropathy    "bad in my right hand and in both feet" (09/24/2017)  . Normal echocardiogram 05/30/05   suboptimal study  . Obesity, morbid (more than 100 lbs over ideal weight or BMI > 40) (HCC)    obese since childhood  . On home oxygen therapy    "4L; 24/7" (09/24/2017)  . Osteoarthritis   . Peripheral edema   . Pressure ulcer of foot    left  . Sepsis ([place])   . Shortness of breath dyspnea   . Sleep apnea 10/2014   "couldn't tolerate the mask" (09/24/2017)  . Type II diabetes mellitus ([place])    Past Surgical History:  Procedure Laterality Date  . ABDOMINAL HERNIA REPAIR  2002  . APPENDECTOMY  1995  . CHOLECYSTECTOMY OPEN  1995  . HERNIA REPAIR    . TOTAL ABDOMINAL HYSTERECTOMY  1994     A IV Location/Drains/Wounds Patient Lines/Drains/Airways Status   Active Line/Drains/Airways    Name:   Placement date:   Placement time:   Site:   Days:   Peripheral IV 05/14/19 Left;Upper Arm   05/14/19    1920    Arm   less than 1   External Urinary Catheter   05/14/19    1958    -   less than 1   Pressure Ulcer 01/16/16 Stage I -  Intact skin with non-blanchable redness of a localized  area usually over a bony prominence.   01/16/16    0100     1214   Pressure Ulcer 01/16/16 Stage I -  Intact skin with non-blanchable redness of a localized area usually over a bony prominence.   01/16/16    0101     1214   Pressure Ulcer 01/16/16 Stage I -  Intact skin with non-blanchable redness of a localized area usually over a bony prominence. pink; with moisture associated skin damage in inner upper gluteal cleft   01/16/16    0220     1214          Intake/Output Last 24 hours No intake or output data in the 24 hours ending 05/14/19 2226  Labs/Imaging Results for orders placed or performed during the hospital encounter of 05/14/19 (from the past 48 hour(s))  Comprehensive metabolic panel     Status: Abnormal   Collection Time: 05/14/19  6:02  PM  Result Value Ref Range   Sodium 148 (H) 135 - 145 mmol/L   Potassium 3.9 3.5 - 5.1 mmol/L   Chloride 86 (L) 98 - 111 mmol/L   CO2 >50 (H) 22 - 32 mmol/L   Glucose, Bld 137 (H) 70 - 99 mg/dL   BUN 13 6 - 20 mg/dL   Creatinine, Ser 0.68 0.44 - 1.00 mg/dL   Calcium 9.3 8.9 - 10.3 mg/dL   Total Protein 7.3 6.5 - 8.1 g/dL   Albumin 3.4 (L) 3.5 - 5.0 g/dL   AST 17 15 - 41 U/L   ALT 16 0 - 44 U/L   Alkaline Phosphatase 87 38 - 126 U/L   Total Bilirubin 0.5 0.3 - 1.2 mg/dL   GFR calc non Af Amer >60 >60 mL/min   GFR calc Af Amer >60 >60 mL/min   Anion gap NOT CALCULATED 5 - 15    Comment: Performed at [facility], [place] [address] [place], [place] [address]  CBC with Differential     Status: Abnormal   Collection Time: 05/14/19  6:02 PM  Result Value Ref Range   WBC 6.4 4.0 - 10.5 K/uL    Comment: WHITE COUNT CONFIRMED ON SMEAR   RBC 3.22 (L) 3.87 - 5.11 MIL/uL   Hemoglobin 9.3 (L) 12.0 - 15.0 g/dL   HCT 34.7 (L) 36.0 - 46.0 %   MCV 107.8 (H) 80.0 - 100.0 fL   MCH 28.9 26.0 - 34.0 pg   MCHC 26.8 (L) 30.0 - 36.0 g/dL   RDW 17.7 (H) 11.5 - 15.5 %   Platelets 177 150 - 400 K/uL   nRBC 0.8 (H) 0.0 - 0.2 %   Neutrophils Relative % 72 %   Neutro Abs 4.7 1.7 - 7.7 K/uL   Lymphocytes Relative 16 %   Lymphs Abs 1.0 0.7 - 4.0 K/uL   Monocytes Relative 7 %   Monocytes Absolute 0.5 0.1 - 1.0 K/uL   Eosinophils Relative 0 %   Eosinophils Absolute 0.0 0.0 - 0.5 K/uL   Basophils Relative 1 %   Basophils Absolute 0.0 0.0 - 0.1 K/uL   Immature Granulocytes 4 %   Abs Immature Granulocytes 0.25 (H) 0.00 - 0.07 K/uL   Polychromasia PRESENT     Comment: Performed at [facility], [place] [address], [place], [place] [address]  Lactic acid, plasma     Status: None   Collection Time: 05/14/19  6:02 PM  Result Value Ref Range   Lactic Acid, Venous 0.8 0.5 - 1.9  mmol/L    Comment: Performed at [place], [place] [address] [place],  [place] [address]  Troponin I - Once     Status: None   Collection Time: 05/14/19  6:02 PM  Result Value Ref Range   Troponin I <0.03 <0.03 ng/mL    Comment: Performed at [facility], [place] [address] [place], [place] [address]  Brain natriuretic peptide     Status: None   Collection Time: 05/14/19  6:02 PM  Result Value Ref Range   B Natriuretic Peptide 36.9 0.0 - 100.0 pg/mL    Comment: Performed at [facility], [place] [address] [place], [place] [address]  SARS Coronavirus 2 (CEPHEID- Performed in [place] hospital lab), Hosp Order     Status: None   Collection Time: 05/14/19  6:22 PM  Result Value Ref Range   SARS Coronavirus 2 NEGATIVE NEGATIVE    Comment: (NOTE) If result is NEGATIVE SARS-CoV-2 target nucleic acids are NOT DETECTED. The SARS-CoV-2 RNA is generally detectable in upper and lower  respiratory specimens during the acute phase of infection. The lowest  concentration of SARS-CoV-2 viral copies this assay can detect is 250  copies / mL. A negative result does not preclude SARS-CoV-2 infection  and should not be used as the sole basis for treatment or other  patient management decisions.  A negative result may occur with  improper specimen collection / handling, submission of specimen other  than nasopharyngeal swab, presence of viral mutation(s) within the  areas targeted by this assay, and inadequate number of viral copies  (<250 copies / mL). A negative result must be combined with clinical  observations, patient history, and epidemiological information. If result is POSITIVE SARS-CoV-2 target nucleic acids are DETECTED. The SARS-CoV-2 RNA is generally detectable in upper and lower  respiratory specimens dur ing the acute phase of infection.  Positive  results are indicative of active infection with SARS-CoV-2.  Clinical  correlation with patient history and other diagnostic information is  necessary to determine patient infection  status.  Positive results do  not rule out bacterial infection or co-infection with other viruses. If result is PRESUMPTIVE POSTIVE SARS-CoV-2 nucleic acids MAY BE PRESENT.   A presumptive positive result was obtained on the submitted specimen  and confirmed on repeat testing.  While 2019 novel coronavirus  (SARS-CoV-2) nucleic acids may be present in the submitted sample  additional confirmatory testing may be necessary for epidemiological  and / or clinical management purposes  to differentiate between  SARS-CoV-2 and other Sarbecovirus currently known to infect humans.  If clinically indicated additional testing with an alternate test  methodology [phone]) is advised. The SARS-CoV-2 RNA is generally  detectable in upper and lower respiratory sp ecimens during the acute  phase of infection. The expected result is Negative. Fact Sheet for Patients:  StrictlyIdeas.no Fact Sheet for Healthcare Providers: BankingDealers.co.za This test is not yet approved or cleared by the [place] FDA and has been authorized for detection and/or diagnosis of SARS-CoV-2 by FDA under an Emergency Use Authorization (EUA).  This EUA will remain in effect (meaning this test can be used) for the duration of the COVID-19 declaration under Section 564(b)(1) of the Act, 21 U.S.C. section 360bbb-3(b)(1), unless the authorization is terminated or revoked sooner. Performed at [facility], [place] [address], [place], [place] [address]   Blood gas, venous     Status: Abnormal   Collection Time: 05/14/19  8:03 PM  Result Value Ref Range   FIO2 100.00    O2 Content 10.0 L/min   Delivery systems NON-REBREATHER OXYGEN MASK    pH, Ven 7.301 7.250 - 7.430   pCO2, Ven 118 (HH) 44.0 - 60.0 mmHg    Comment: CRITICAL RESULT CALLED TO, READ BACK BY AND VERIFIED WITH: RACHEL LITTLE, MD AT 2009 ON 05/14/2019 BY DANIEL JONES, RRT, RCP    pO2, Ven 49.8 (H)  32.0 - 45.0 mmHg   Bicarbonate 56.8 (H) 20.0 - 28.0 mmol/L   Acid-Base Excess 26.5 (H) 0.0 - 2.0 mmol/L   O2 Saturation 82.5 %   Patient temperature 98.2    Collection site VEIN    Drawn by DRAWN BY RN    Sample type VENOUS     Comment: Performed at [place] [address] [place], [place] [address]  Urinalysis, Routine w reflex microscopic     Status: Abnormal   Collection Time: 05/14/19  8:59 PM  Result Value Ref Range   Color, Urine YELLOW YELLOW   APPearance HAZY (A) CLEAR   Specific Gravity, Urine 1.012 1.005 - 1.030   pH 6.0 5.0 - 8.0   Glucose, UA NEGATIVE NEGATIVE mg/dL   Hgb urine dipstick SMALL (A) NEGATIVE   Bilirubin Urine NEGATIVE NEGATIVE   Ketones, ur NEGATIVE NEGATIVE mg/dL   Protein, ur 30 (A) NEGATIVE mg/dL   Nitrite NEGATIVE NEGATIVE   Leukocytes,Ua NEGATIVE NEGATIVE   RBC / HPF 11-20 0 - 5 RBC/hpf   WBC, UA 0-5 0 - 5 WBC/hpf   Bacteria, UA MANY (A) NONE SEEN   Squamous Epithelial / LPF 0-5 0 - 5   Mucus PRESENT     Comment: Performed at [facility], [place] [address], [place], [place] [address]   Dg Chest Portable 1 View  Result Date: 05/14/2019 CLINICAL DATA:  Low oxygen saturation. EXAM: PORTABLE CHEST 1 VIEW COMPARISON:  01/10/2019 and multiple previous FINDINGS: The patient is rotated towards the right as usual. Cardiac silhouette is enlarged. There is diffuse pulmonary density which appear slightly more prominent than seen previously. This could reflect pulmonary edema superimposed on chronic lung disease. No dense consolidation or lobar collapse. IMPRESSION: Rotated. Cardiomegaly. Diffuse lung density, similar to previous studies but possibly slightly more pronounced. This could represent mild edema superimposed upon chronic lung disease. Electronically Signed   By: Nelson Chimes M.D.   On: 05/14/2019 19:11    Pending Labs Unresulted Labs (From admission, onward)    Start     Ordered   05/14/19 2156  D-dimer,  quantitative (not at [facility])  [place],   R     05/14/19 2155   05/14/19 2107  HIV antibody (Routine Testing)  Once,   R     05/14/19 2109   05/14/19 2052  Influenza panel by PCR (type A & B)  Add-on,   R     05/14/19 2051   05/14/19 2052  Respiratory Panel by PCR  (Respiratory virus panel with precautions)  Add-on,   R     05/14/19 2051   05/14/19 1802  Lactic acid, plasma  Now then every 2 hours,   STAT     05/14/19 1802          Vitals/Pain Today's Vitals   05/14/19 2025 05/14/19 2102 05/14/19 2130 05/14/19 2200  BP: (!) 160/148 107/64 108/75 110/74  Pulse: 98 96 94 93  Resp: 18 16 19 15   Temp:      TempSrc:  SpO2: 96% 94% 92% 93%    Isolation Precautions Droplet precaution  Medications Medications  polyethylene glycol (MIRALAX / GLYCOLAX) packet 17 g (has no administration in time range)  metoprolol succinate (TOPROL-XL) 24 hr tablet 12.5 mg (has no administration in time range)  pravastatin (PRAVACHOL) tablet 40 mg (has no administration in time range)  venlafaxine XR (EFFEXOR-XR) 24 hr capsule 300 mg (has no administration in time range)  senna (SENOKOT) tablet 17.2 mg (has no administration in time range)  lamoTRIgine (LAMICTAL) tablet 25 mg (has no administration in time range)  feeding supplement (PRO-STAT SUGAR FREE 64) liquid 30 mL (has no administration in time range)  multivitamin liquid 5 mL (has no administration in time range)  cholecalciferol (VITAMIN D) tablet 1,000 Units (has no administration in time range)  vitamin C (ASCORBIC ACID) tablet 3,000 mg (has no administration in time range)  omega-3 acid ethyl esters (LOVAZA) capsule 1 g (has no administration in time range)  loratadine (CLARITIN) tablet 10 mg (has no administration in time range)  fluticasone (FLONASE) 50 MCG/ACT nasal spray 1 spray (has no administration in time range)  fluticasone furoate-vilanterol (BREO ELLIPTA) 100-25 MCG/INH 1 puff (has no administration in time range)  sodium  chloride (OCEAN) 0.65 % nasal spray 2 spray (has no administration in time range)  sodium fluoride (PREVIDENT 5000 PLUS) 1.1 % dental cream (has no administration in time range)  albuterol (PROVENTIL) (2.5 MG/3ML) 0.083% nebulizer solution 2.5 mg (has no administration in time range)  dextromethorphan-guaiFENesin (MUCINEX DM) 30-600 MG per 12 hr tablet 1 tablet (has no administration in time range)  enoxaparin (LOVENOX) injection 40 mg (has no administration in time range)  ondansetron (ZOFRAN) injection 4 mg (has no administration in time range)  hydrALAZINE (APRESOLINE) injection 5 mg (has no administration in time range)  acetaminophen (TYLENOL) tablet 650 mg (has no administration in time range)  furosemide (LASIX) injection 80 mg (has no administration in time range)  oxyCODONE-acetaminophen (PERCOCET/ROXICET) 5-325 MG per tablet 1 tablet (has no administration in time range)    And  oxyCODONE (Oxy IR/ROXICODONE) immediate release tablet 5 mg (has no administration in time range)  ipratropium-albuterol (DUONEB) 0.5-2.5 (3) MG/3ML nebulizer solution 3 mL (has no administration in time range)  haloperidol lactate (HALDOL) injection 2 mg (2 mg Intravenous Given 05/14/19 2056)  furosemide (LASIX) injection 80 mg (80 mg Intravenous Given 05/14/19 2056)  haloperidol lactate (HALDOL) injection 2 mg (2 mg Intravenous Given 05/14/19 2221)    Mobility non-ambulatory High fall risk   Focused Assessments Pulmonary Assessment Handoff:  Lung sounds: Bilateral Breath Sounds: Diminished L Breath Sounds: Diminished R Breath Sounds: Diminished O2 Device: Bi-PAP O2 Flow Rate (L/min): 10 L/min      R Recommendations: See Admitting Provider Note  Report given to:   Additional Notes:

## 2019-05-14 NOTE — ED Notes (Signed)
Attempted blood draw x3.  No success.  Will consult IV team. MD aware

## 2019-05-14 NOTE — ED Notes (Addendum)
Pt O2 sats decreased to 63% on 6L by [place].  Pt placed on 15 L via NRB, O2 sats increased to 95%.  MD Little made aware.  Will continue to monitor.

## 2019-05-14 NOTE — ED Notes (Signed)
Pt in bed. Readjusted and purewick placed in hopes of obtaining a urine sample. Pt updated on POC and in NAD at this time.

## 2019-05-14 NOTE — ED Notes (Signed)
Respiratory called for BiPap

## 2019-05-14 NOTE — Progress Notes (Signed)
Pt arrived on the unit in room 1241

## 2019-05-14 NOTE — ED Notes (Signed)
Pt placed on BiPap. Pt is aggitated and not tolerating it well and provider was notified.

## 2019-05-14 NOTE — ED Notes (Signed)
Bed: CL51 Expected date:  Expected time:  Means of arrival:  Comments: EMS-low O2-nonrebreather

## 2019-05-14 NOTE — Progress Notes (Signed)
Pt transferred to room 1241 while on BiPAP.  Pt remained stable on BiPAP throughout transport.

## 2019-05-15 ENCOUNTER — Inpatient Hospital Stay (HOSPITAL_COMMUNITY): Payer: Medicare Other

## 2019-05-15 ENCOUNTER — Encounter (HOSPITAL_COMMUNITY): Payer: Self-pay

## 2019-05-15 DIAGNOSIS — I5033 Acute on chronic diastolic (congestive) heart failure: Secondary | ICD-10-CM

## 2019-05-15 DIAGNOSIS — E870 Hyperosmolality and hypernatremia: Secondary | ICD-10-CM

## 2019-05-15 DIAGNOSIS — J441 Chronic obstructive pulmonary disease with (acute) exacerbation: Secondary | ICD-10-CM

## 2019-05-15 DIAGNOSIS — J9621 Acute and chronic respiratory failure with hypoxia: Principal | ICD-10-CM

## 2019-05-15 DIAGNOSIS — J9622 Acute and chronic respiratory failure with hypercapnia: Secondary | ICD-10-CM

## 2019-05-15 DIAGNOSIS — G9341 Metabolic encephalopathy: Secondary | ICD-10-CM

## 2019-05-15 DIAGNOSIS — E876 Hypokalemia: Secondary | ICD-10-CM

## 2019-05-15 LAB — BLOOD GAS, ARTERIAL
Acid-Base Excess: 25.8 mmol/L — ABNORMAL HIGH (ref 0.0–2.0)
Acid-Base Excess: 25.5 mmol/L — ABNORMAL HIGH (ref 0.0–2.0)
Bicarbonate: 56.1 mmol/L — ABNORMAL HIGH (ref 20.0–28.0)
Bicarbonate: 53.3 mmol/L — ABNORMAL HIGH (ref 20.0–28.0)
Delivery systems: POSITIVE
Delivery systems: POSITIVE
Drawn by: 331471
Drawn by: 331471
Expiratory PAP: 6
Expiratory PAP: 10
FIO2: 70
FIO2: 70
Inspiratory PAP: 20
Inspiratory PAP: 22
O2 Saturation: 86.8 %
O2 Saturation: 91.7 %
Patient temperature: 98.6
Patient temperature: 98.6
pCO2 arterial: 83.1 mmHg (ref 32.0–48.0)
pCO2 arterial: 75.7 mmHg (ref 32.0–48.0)
pH, Arterial: 7.445 (ref 7.350–7.450)
pH, Arterial: 7.462 — ABNORMAL HIGH (ref 7.350–7.450)
pO2, Arterial: 53.1 mmHg — ABNORMAL LOW (ref 83.0–108.0)
pO2, Arterial: 62.3 mmHg — ABNORMAL LOW (ref 83.0–108.0)

## 2019-05-15 LAB — CBC WITH DIFFERENTIAL/PLATELET
Abs Immature Granulocytes: 0.16 10*3/uL — ABNORMAL HIGH (ref 0.00–0.07)
Basophils Absolute: 0 10*3/uL (ref 0.0–0.1)
Basophils Relative: 0 %
Eosinophils Absolute: 0 10*3/uL (ref 0.0–0.5)
Eosinophils Relative: 0 %
HCT: 32.2 % — ABNORMAL LOW (ref 36.0–46.0)
Hemoglobin: 8.9 g/dL — ABNORMAL LOW (ref 12.0–15.0)
Immature Granulocytes: 3 %
Lymphocytes Relative: 15 %
Lymphs Abs: 0.9 10*3/uL (ref 0.7–4.0)
MCH: 29.1 pg (ref 26.0–34.0)
MCHC: 27.6 g/dL — ABNORMAL LOW (ref 30.0–36.0)
MCV: 105.2 fL — ABNORMAL HIGH (ref 80.0–100.0)
Monocytes Absolute: 0.4 10*3/uL (ref 0.1–1.0)
Monocytes Relative: 7 %
Neutro Abs: 4.6 10*3/uL (ref 1.7–7.7)
Neutrophils Relative %: 75 %
Platelets: 170 10*3/uL (ref 150–400)
RBC: 3.06 MIL/uL — ABNORMAL LOW (ref 3.87–5.11)
RDW: 17.2 % — ABNORMAL HIGH (ref 11.5–15.5)
WBC: 6.1 10*3/uL (ref 4.0–10.5)
nRBC: 0.3 % — ABNORMAL HIGH (ref 0.0–0.2)

## 2019-05-15 LAB — LACTIC ACID, PLASMA: Lactic Acid, Venous: 1.2 mmol/L (ref 0.5–1.9)

## 2019-05-15 LAB — RESPIRATORY PANEL BY PCR

## 2019-05-15 LAB — C-REACTIVE PROTEIN: CRP: 6.4 mg/dL — ABNORMAL HIGH (ref <1.0)

## 2019-05-15 LAB — FERRITIN: Ferritin: 153 ng/mL (ref 11–307)

## 2019-05-15 LAB — BASIC METABOLIC PANEL
Anion gap: 13 (ref 5–15)
BUN: 15 mg/dL (ref 6–20)
CO2: 49 mmol/L — ABNORMAL HIGH (ref 22–32)
Calcium: 9.1 mg/dL (ref 8.9–10.3)
Chloride: 85 mmol/L — ABNORMAL LOW (ref 98–111)
Creatinine, Ser: 0.74 mg/dL (ref 0.44–1.00)
GFR calc Af Amer: >60 mL/min (ref >60)
GFR calc non Af Amer: >60 mL/min (ref >60)
Glucose, Bld: 141 mg/dL — ABNORMAL HIGH (ref 70–99)
Potassium: 3 mmol/L — ABNORMAL LOW (ref 3.5–5.1)
Sodium: 147 mmol/L — ABNORMAL HIGH (ref 135–145)

## 2019-05-15 LAB — GLUCOSE, CAPILLARY: Glucose-Capillary: 148 mg/dL — ABNORMAL HIGH (ref 70–99)

## 2019-05-15 LAB — INFLUENZA PANEL BY PCR (TYPE A & B)
Influenza A By PCR: NEGATIVE
Influenza B By PCR: NEGATIVE

## 2019-05-15 LAB — LACTATE DEHYDROGENASE: LDH: 195 U/L — ABNORMAL HIGH (ref 98–192)

## 2019-05-15 LAB — FIBRINOGEN: Fibrinogen: 633 mg/dL — ABNORMAL HIGH (ref 210–475)

## 2019-05-15 LAB — MAGNESIUM: Magnesium: 2 mg/dL (ref 1.7–2.4)

## 2019-05-15 LAB — D-DIMER, QUANTITATIVE: D-Dimer, Quant: 1.38 ug/mL-FEU — ABNORMAL HIGH (ref 0.00–0.50)

## 2019-05-15 LAB — ABO/RH: ABO/RH(D): O NEG

## 2019-05-15 LAB — MRSA PCR SCREENING: MRSA by PCR: POSITIVE — AB

## 2019-05-15 LAB — SARS CORONAVIRUS 2 BY RT PCR (HOSPITAL ORDER, PERFORMED IN ~~LOC~~ HOSPITAL LAB): SARS Coronavirus 2: NEGATIVE

## 2019-05-15 MED ORDER — ORAL CARE MOUTH RINSE
15.0000 mL | Freq: Two times a day (BID) | OROMUCOSAL | Status: DC
  Administered 2019-05-15 – 2019-05-22 (×10): 15 mL via OROMUCOSAL

## 2019-05-15 MED ORDER — METHYLPREDNISOLONE SODIUM SUCC 125 MG IJ SOLR
80.0000 mg | Freq: Three times a day (TID) | INTRAMUSCULAR | Status: DC
  Administered 2019-05-15 – 2019-05-16 (×3): 80 mg via INTRAVENOUS
  Filled 2019-05-15 (×3): qty 2

## 2019-05-15 MED ORDER — METOPROLOL TARTRATE 5 MG/5ML IV SOLN
5.0000 mg | Freq: Three times a day (TID) | INTRAVENOUS | Status: DC
  Administered 2019-05-15 – 2019-05-18 (×9): 5 mg via INTRAVENOUS
  Filled 2019-05-15 (×9): qty 5

## 2019-05-15 MED ORDER — HALOPERIDOL LACTATE 5 MG/ML IJ SOLN
2.0000 mg | Freq: Once | INTRAMUSCULAR | Status: AC
  Administered 2019-05-15: 22:00:00 2 mg via INTRAVENOUS
  Filled 2019-05-15: qty 1

## 2019-05-15 MED ORDER — IOHEXOL 350 MG/ML SOLN
100.0000 mL | Freq: Once | INTRAVENOUS | Status: AC | PRN
  Administered 2019-05-15: 14:00:00 100 mL via INTRAVENOUS

## 2019-05-15 MED ORDER — MUPIROCIN 2 % EX OINT
1.0000 "application " | TOPICAL_OINTMENT | Freq: Two times a day (BID) | CUTANEOUS | Status: AC
  Administered 2019-05-15 – 2019-05-19 (×10): 1 via NASAL
  Filled 2019-05-15: qty 22

## 2019-05-15 MED ORDER — LIP MEDEX EX OINT
TOPICAL_OINTMENT | CUTANEOUS | Status: AC
  Administered 2019-05-15: 03:00:00
  Filled 2019-05-15: qty 7

## 2019-05-15 MED ORDER — LORAZEPAM 2 MG/ML IJ SOLN
0.5000 mg | Freq: Once | INTRAMUSCULAR | Status: AC
  Administered 2019-05-15: 0.5 mg via INTRAVENOUS
  Filled 2019-05-15: qty 1

## 2019-05-15 MED ORDER — LORAZEPAM 2 MG/ML IJ SOLN
0.5000 mg | Freq: Once | INTRAMUSCULAR | Status: AC
  Administered 2019-05-16: 0.5 mg via INTRAVENOUS
  Filled 2019-05-15: qty 1

## 2019-05-15 MED ORDER — ACETAZOLAMIDE SODIUM 500 MG IJ SOLR
500.0000 mg | Freq: Once | INTRAMUSCULAR | Status: AC
  Administered 2019-05-15: 500 mg via INTRAVENOUS
  Filled 2019-05-15: qty 500

## 2019-05-15 MED ORDER — SODIUM CHLORIDE (PF) 0.9 % IJ SOLN
INTRAMUSCULAR | Status: AC
  Administered 2019-05-15: 16:00:00
  Filled 2019-05-15: qty 50

## 2019-05-15 MED ORDER — CHLORHEXIDINE GLUCONATE CLOTH 2 % EX PADS
6.0000 | MEDICATED_PAD | Freq: Every day | CUTANEOUS | Status: DC
  Administered 2019-05-15 – 2019-05-18 (×4): 6 via TOPICAL

## 2019-05-15 MED ORDER — POTASSIUM CHLORIDE 10 MEQ/100ML IV SOLN
10.0000 meq | INTRAVENOUS | Status: AC
  Administered 2019-05-15 (×5): 10 meq via INTRAVENOUS
  Filled 2019-05-15 (×5): qty 100

## 2019-05-15 MED ORDER — PANTOPRAZOLE SODIUM 40 MG IV SOLR
40.0000 mg | Freq: Every day | INTRAVENOUS | Status: DC
  Administered 2019-05-15 – 2019-05-17 (×3): 40 mg via INTRAVENOUS
  Filled 2019-05-15 (×3): qty 40

## 2019-05-15 MED ORDER — LEVOFLOXACIN IN D5W 750 MG/150ML IV SOLN
750.0000 mg | INTRAVENOUS | Status: DC
  Administered 2019-05-15 – 2019-05-17 (×3): 750 mg via INTRAVENOUS
  Filled 2019-05-15 (×4): qty 150

## 2019-05-15 MED ORDER — ADULT MULTIVITAMIN LIQUID CH
15.0000 mL | Freq: Every day | ORAL | Status: DC
  Administered 2019-05-17 – 2019-05-23 (×5): 15 mL via ORAL
  Filled 2019-05-15 (×10): qty 15

## 2019-05-15 MED ORDER — LEVOFLOXACIN IN D5W 500 MG/100ML IV SOLN: 500.0000 mg | INTRAVENOUS | Status: DC

## 2019-05-15 NOTE — Consult Note (Addendum)
NAME:  Kathleen Roy, MRN:  [id number], DOB:  1961-04-17, LOS: 1 ADMISSION DATE:  05/14/2019, CONSULTATION DATE: 05/15/2019 REFERRING MD: Dr. Grandville Silos, CHIEF COMPLAINT: Shortness of breath  Brief History   Patient admitted with shortness of breath Admitted from [facility] with hypoxemia Patient usually on 4 L of oxygen, saturations noted to be in the 80s  History of present illness   History of chronic obstructive pulmonary disease History of not obtainable from the patient Has a history of obstructive sleep apnea  Past Medical History   Past Medical History:  Diagnosis Date  . Arthritis    "I feel like it's everywhere" (09/24/2017  . Cellulitis and abscess of lower extremity 08/16/2016   right leg  . CHF (congestive heart failure) ([place])   . Chronic lower back pain   . COPD (chronic obstructive pulmonary disease) ([place])   . Depression   . Fibromyalgia   . Headache    "once q couple months now" (09/24/2017)  . Hyperlipidemia   . Hypertension   . HYPERTENSION, BENIGN SYSTEMIC 02/12/2007  . Memory disturbance   . Migraines 1970's - <2000   "they just went away"  . Neuropathy    "bad in my right hand and in both feet" (09/24/2017)  . Normal echocardiogram 05/30/05   suboptimal study  . Obesity, morbid (more than 100 lbs over ideal weight or BMI > 40) (HCC)    obese since childhood  . On home oxygen therapy    "4L; 24/7" (09/24/2017)  . Osteoarthritis   . Peripheral edema   . Pressure ulcer of foot    left  . Sepsis ([place])   . Shortness of breath dyspnea   . Sleep apnea 10/2014   "couldn't tolerate the mask" (09/24/2017)  . Type II diabetes mellitus ([place])    Significant Hospital Events   Increasing oxygen requirement  Consults:  PCCM 05/15/2019  Procedures:  None  Significant Diagnostic Tests:  CXR showed:  Cardiomegaly. Diffuse lung density, similar to previous studies but possibly slightly more pronounced. This could represent mild edema superimposed upon chronic  lung disease  Micro Data:  MRSA by PCR positive  Antimicrobials:  None  Interim history/subjective:  Lethargic  Objective   Blood pressure 131/84, pulse 97, temperature 98.8 F (37.1 C), temperature source Axillary, resp. rate 19, height 5\' 2"  (1.575 m), weight (!) 146.8 kg, SpO2 (!) 87 %.    FiO2 (%):  [80 %-100 %] 80 %   Intake/Output Summary (Last 24 hours) at 05/15/2019 1259 Last data filed at 05/15/2019 1000 Gross per 24 hour  Intake -  Output 650 ml  Net -650 ml   Filed Weights   05/14/19 2340 05/15/19 0500  Weight: (!) 146.4 kg (!) 146.8 kg    Examination: General: Morbidly obese, lethargic HENT: BiPAP on at present Lungs: Decreased air entry bilaterally Cardiovascular: S1-S2 appreciated, distant sounds Abdomen: Bowel sounds appreciated Extremities: No edema Neuro: Lethargic GU:   Resolved Hospital Problem list     Assessment & Plan:  Hypercapnic respiratory failure -Continue BiPAP support -Trend arterial blood gases -Bronchodilator treatments  History of obstructive sleep apnea -Only history available is of noncompliance  -Unclear whether she was still on CPAP or BiPAP recently -previous study did document the need for BiPAP  morbid obesity -May be contributing to obesity hypoventilation and chronic hypercapnic respiratory failure  Encephalopathy -Likely multifactorial -May be related to medications -Hold sedating medications  History of congestive heart failure -Euvolemic -BNP 195 -On diuretics  Metabolic  alkalosis -May be on account of vascular contraction -Dose of Diamox -Follow blood gases  History of chronic obstructive pulmonary disease -Bronchodilators-DuoNeb -Has no leukocytosis, no fever-less likely an infectious exacerbation  Fibromyalgia Depression  Follow-up on CT scan of the chest has current x-ray showing multifocal haziness which may be on account of heart failure changes, patient not revealing any symptoms suggesting  an infectious process at present Coronavirus retesting was negative-we willde- isolate  Best practice:  Diet: N.p.o. Pain/Anxiety/Delirium protocol (if indicated): PRN oxycodone VAP protocol (if indicated): Not applicable DVT prophylaxis: Enoxaparin GI prophylaxis: Protonix Mobility: Bedrest Code Status: DNR  Disposition: ICU  May be de-isolated-coronavirus testing negative x2  Labs   CBC: Recent Labs  Lab 05/14/19 1802 05/15/19 1001  WBC 6.4 6.1  NEUTROABS 4.7 4.6  HGB 9.3* 8.9*  HCT 34.7* 32.2*  MCV 107.8* 105.2*  PLT 177 PENDING    Basic Metabolic Panel: Recent Labs  Lab 05/14/19 1802 05/15/19 1001  NA 148* 147*  K 3.9 3.0*  CL 86* 85*  CO2 >50* 49*  GLUCOSE 137* 141*  BUN 13 15  CREATININE 0.68 0.74  CALCIUM 9.3 9.1  MG  --  2.0   GFR: Estimated Creatinine Clearance: 110.1 mL/min (by C-G formula based on SCr of 0.74 mg/dL). Recent Labs  Lab 05/14/19 1802 05/15/19 0253 05/15/19 1001  WBC 6.4  --  6.1  LATICACIDVEN 0.8 1.2  --     Liver Function Tests: Recent Labs  Lab 05/14/19 1802  AST 17  ALT 16  ALKPHOS 87  BILITOT 0.5  PROT 7.3  ALBUMIN 3.4*   No results for input(s): LIPASE, AMYLASE in the last 168 hours. No results for input(s): AMMONIA in the last 168 hours.  ABG    Component Value Date/Time   PHART 7.445 05/15/2019 0810   PCO2ART 83.1 (HH) 05/15/2019 0810   PO2ART 53.1 (L) 05/15/2019 0810   HCO3 56.1 (H) 05/15/2019 0810   TCO2 44 (H) 11/11/2017 0101   O2SAT 86.8 05/15/2019 0810     Coagulation Profile: No results for input(s): INR, PROTIME in the last 168 hours.  Cardiac Enzymes: Recent Labs  Lab 05/14/19 1802  TROPONINI <0.03    HbA1C: Hemoglobin A1C  Date/Time Value Ref Range Status  06/04/2016 04:18 PM 6.3  Final   Hgb A1c MFr Bld  Date/Time Value Ref Range Status  09/20/2017 04:10 AM 4.9 4.8 - 5.6 % Final    Comment:    (NOTE) Pre diabetes:          5.7%-6.4% Diabetes:              >6.4% Glycemic  control for   <7.0% adults with diabetes   01/16/2016 02:55 AM 8.0 (H) 4.8 - 5.6 % Final    Comment:    (NOTE)         Pre-diabetes: 5.7 - 6.4         Diabetes: >6.4         Glycemic control for adults with diabetes: <7.0     CBG: Recent Labs  Lab 05/15/19 0830  GLUCAP 148*    Review of Systems:   Review of Systems  Unable to perform ROS: Mental status change   Past Medical History  She,  has a past medical history of Arthritis, Cellulitis and abscess of lower extremity (08/16/2016), CHF (congestive heart failure) (HCC), Chronic lower back pain, COPD (chronic obstructive pulmonary disease) ([place]), Depression, Fibromyalgia, Headache, Hyperlipidemia, Hypertension, HYPERTENSION, BENIGN SYSTEMIC (02/12/2007), Memory disturbance, Migraines (1970's - <  2000), Neuropathy, Normal echocardiogram (05/30/05), Obesity, morbid (more than 100 lbs over ideal weight or BMI > 40) ([place]), On home oxygen therapy, Osteoarthritis, Peripheral edema, Pressure ulcer of foot, Sepsis ([place]), Shortness of breath dyspnea, Sleep apnea (10/2014), and Type II diabetes mellitus ([place]).   Surgical History    Past Surgical History:  Procedure Laterality Date  . ABDOMINAL HERNIA REPAIR  2002  . APPENDECTOMY  1995  . CHOLECYSTECTOMY OPEN  1995  . HERNIA REPAIR    . TOTAL ABDOMINAL HYSTERECTOMY  1994     Social History   reports that she quit smoking about 10 years ago. Her smoking use included cigarettes. She has a 30.00 pack-year smoking history. She has never used smokeless tobacco. She reports that she does not drink alcohol or use drugs.   Family History   Her family history includes Congestive Heart Failure in her brother; Dementia in her father; Diabetes in her brother; Hypertension in her brother; Prostate cancer in her father; Stroke in her mother.   Allergies Allergies  Allergen Reactions  . Aspirin Hives    The patient is critically ill with multiple organ system failure and requires high complexity  decision making for assessment and support, frequent evaluation and titration of therapies, advanced monitoring, review of radiographic studies and interpretation of complex data.    Critical Care Time devoted to patient care services, exclusive of separately billable procedures, described in this note is 30 minutes.  Sherrilyn Rist, MD , PCCM Cell: [phone]

## 2019-05-15 NOTE — Progress Notes (Signed)
RN paged MD Grandville Silos about pt negative Covid test and to ask about discontinuing precautions, and to also make him aware of pt lab result potassium of 3.0. No orders were given to replace potassium. Will continue to monitor

## 2019-05-15 NOTE — Progress Notes (Signed)
PROGRESS NOTE    Kathleen Roy  DZH:[id number] DOB: 1961-04-18 DOA: 05/14/2019 PCP: [name], MD    Brief Narrative:  Per Dr [name] is a 58 y.o. female with medical history significant of COPD, chronic respiratory failure on 4 L nasal cannula oxygen, HTN, HLD, diet-controled DM, dCHF, morbid obesity, OSA, hypoventilation syndrome, migraine headache, fibromyalgia, memory loss, who presents with SOB and oxygen desaturation.  Per report, pt was found to have oxygen desaturation to low 80s on baseline 4 L nasal cannula oxygen in the facility. Pt is placed on NRB at 15 L O2- 95%. She has hx of memory loss. She is mildly confused. She response to calling her name, but is not oriented to place and time.  She does not complain of any pain.  Does not seem to have chest pain or abdominal pain.  No active cough, nausea, vomiting, diarrhea noted. Not sure if pt has symptoms of UTI.  She moves all extremities on painful stimuli.  No facial droop.  ED Course: pt was found to have BNP 36.9, negative COVID-19 test, pending WBC, negative trop, renal function normal, temperature normal, heart rate in 90s, RR 25, oxygen 95% on BiPAP. VBG with pH 7.301, CO2 118, O2 49.1.  Patient is admitted to stepdown bed as inpatient.  # CXR showed:  Cardiomegaly. Diffuse lung density, similar to previous studies but possibly slightly more pronounced. This could represent mild edema superimposed upon chronic lung disease  Assessment & Plan:   Principal Problem:   Acute on chronic respiratory failure with hypoxia and hypercapnia (HCC) Active Problems:   Major depressive disorder, recurrent episode (HCC)   COPD (chronic obstructive pulmonary disease) (HCC)   Hypokalemia   OSA (obstructive sleep apnea)   Obesity hypoventilation syndrome (HCC)   Chronic diastolic congestive heart failure (HCC)   Essential hypertension   Diabetes mellitus without complication (HCC)   Macrocytic anemia   Acute metabolic  encephalopathy   Acute on chronic diastolic congestive heart failure (HCC)   Hypernatremia   1 acute on chronic respiratory failure with hypoxia and hypercapnia  Likely multifactorial secondary to probable acute COPD exacerbation, CHF exacerbation in the setting of OSA and hypoventilation syndrome.  Concern for COVID-19.  Patient is morbidly obese, patient with decreased air movement, difficult to assess volume status.  Chest x-ray showing possible mild pulmonary edema.  Patient with increased O2 requirements was 80% on 4 L home O2 on presentation, requiring nonrebreather and now on BiPAP.  Patient in the stepdown unit.  Patient somewhat lethargic on examination.  SARS-CoV-2 was negative on admission however due to patient's increasing O2 requirements currently on BiPAP concern for COVID.  Repeat SARS-CoV-2 test.  Check ABG.  Check a ferritin, d-dimer, LDH, IL-6, CT chest.  Respiratory viral panel pending.  Continue Flonase, nebulizer treatments, Lasix 80 mg IV every 12 hours.  Placed on airborne precautions due to increased O2 requirements and concern for COVID 19.  Place on IV Levaquin and IV steroid taper.  Due to worsening respiratory status will consult with critical care medicine for further evaluation and management.  2.  Probable acute COPD exacerbation Patient with poor air movement.  Patient with acute on chronic respiratory failure now on BiPAP.  Venous blood gas was obtained on admission.  Check a ABG.  Continue BiPAP, nebulizer treatments.  Placed on IV Levaquin and IV steroid taper.  Flonase.  IV PPI.  Due to worsening respiratory status will consult with critical care medicine for  further evaluation and management.  3.  Probable acute on chronic diastolic CHF Patient presented with problem #1.  2D echo on 08/20/2018 with a EF of 60 to 65% with grade 2 diastolic dysfunction.  Patient on admission noted to have some trace lower extremity edema.  Chest x-ray with concern for possible mild  pulmonary edema.  Patient was on torsemide twice daily.  Patient currently on Lasix 80 mg IV every 12 hours.  Strict I's and O's.  Daily weights.  Will repeat 2D echo.  Follow.  4.  Major depressive disorder recurrent Patient on oral Effexor.  Patient currently lethargic and as such we will hold Effexor for now until patient is more alert and may subsequently resume.  5.  Acute metabolic encephalopathy and history of memory loss Likely secondary to problem #1.  Patient with no signs of source of infection noted.  See #1.  Check ABG.  6.  Obstructive sleep apnea/obesity hypoventilation syndrome On BiPAP.  7.  Hypertension Toprol-XL and place on IV Lopressor 5 mg every 8 hours.  8.  Well-controlled diabetes mellitus type 2 Last hemoglobin A1c was 4.9 on 09/20/2017.  Check a hemoglobin A1c.  Follow.  9.  Microcytic anemia H&H stable.  Follow.  10 hypokalemia Magnesium at 2.0.  Replete.     DVT prophylaxis: Lovenox Code Status: DNR Family Communication: No family at bedside. Disposition Plan: Remain in stepdown.  Patient lethargic.  Patient on BiPAP.   Consultants:   PCCM pending  Procedures:   CT angiogram chest pending  Chest x-ray 05/14/2019    Antimicrobials:   IV Levaquin 05/15/2019   Subjective: Patient on BiPAP.  Patient lethargic.  Objective: Vitals:   05/15/19 1100 05/15/19 1200 05/15/19 1300 05/15/19 1400  BP: 104/86 131/84 (!) 112/91 (!) 134/113  Pulse: (!) 105 97 (!) 104 99  Resp: (!) 24 19 (!) 22 13  Temp:      TempSrc:      SpO2: (!) 89% (!) 87% 92% 92%  Weight:      Height:        Intake/Output Summary (Last 24 hours) at 05/15/2019 1620 Last data filed at 05/15/2019 1000 Gross per 24 hour  Intake --  Output 650 ml  Net -650 ml   Filed Weights   05/14/19 2340 05/15/19 0500  Weight: (!) 146.4 kg (!) 146.8 kg    Examination:  General exam: Patient on BiPAP.  Lethargic. Respiratory system: Diminished breath sounds in the bases.  Some  scattered coarse breath sounds.  On BiPAP.  Cardiovascular system: Distant heart sounds secondary to body habitus.  S1 & S2 heard, RRR. No JVD, murmurs, rubs, gallops or clicks. No pedal edema. Gastrointestinal system: Abdomen is nondistended, soft and nontender. No organomegaly or masses felt. Normal bowel sounds heard. Central nervous system: Lethargic.  On BiPAP.Marland Kitchen No focal neurological deficits. Extremities: Symmetric 5 x 5 power. Skin: No rashes, lesions or ulcers Psychiatry: Judgement and insight appear normal. Mood & affect appropriate.     Data Reviewed: I have personally reviewed following labs and imaging studies  CBC: Recent Labs  Lab 05/14/19 1802 05/15/19 1001  WBC 6.4 6.1  NEUTROABS 4.7 4.6  HGB 9.3* 8.9*  HCT 34.7* 32.2*  MCV 107.8* 105.2*  PLT 177 924   Basic Metabolic Panel: Recent Labs  Lab 05/14/19 1802 05/15/19 1001  NA 148* 147*  K 3.9 3.0*  CL 86* 85*  CO2 >50* 49*  GLUCOSE 137* 141*  BUN 13 15  CREATININE 0.68 0.74  CALCIUM 9.3 9.1  MG  --  2.0   GFR: Estimated Creatinine Clearance: 110.1 mL/min (by C-G formula based on SCr of 0.74 mg/dL). Liver Function Tests: Recent Labs  Lab 05/14/19 1802  AST 17  ALT 16  ALKPHOS 87  BILITOT 0.5  PROT 7.3  ALBUMIN 3.4*   No results for input(s): LIPASE, AMYLASE in the last 168 hours. No results for input(s): AMMONIA in the last 168 hours. Coagulation Profile: No results for input(s): INR, PROTIME in the last 168 hours. Cardiac Enzymes: Recent Labs  Lab 05/14/19 1802  TROPONINI <0.03   BNP (last 3 results) No results for input(s): PROBNP in the last 8760 hours. HbA1C: No results for input(s): HGBA1C in the last 72 hours. CBG: Recent Labs  Lab 05/15/19 0830  GLUCAP 148*   Lipid Profile: No results for input(s): CHOL, HDL, LDLCALC, TRIG, CHOLHDL, LDLDIRECT in the last 72 hours. Thyroid Function Tests: No results for input(s): TSH, T4TOTAL, FREET4, T3FREE, THYROIDAB in the last 72  hours. Anemia Panel: Recent Labs    05/15/19 1001  FERRITIN 153   Sepsis Labs: Recent Labs  Lab 05/14/19 1802 05/15/19 0253  LATICACIDVEN 0.8 1.2    Recent Results (from the past 240 hour(s))  SARS Coronavirus 2 (CEPHEID- Performed in [facility] hospital lab), Hosp Order     Status: None   Collection Time: 05/14/19  6:22 PM  Result Value Ref Range Status   SARS Coronavirus 2 NEGATIVE NEGATIVE Final    Comment: (NOTE) If result is NEGATIVE SARS-CoV-2 target nucleic acids are NOT DETECTED. The SARS-CoV-2 RNA is generally detectable in upper and lower  respiratory specimens during the acute phase of infection. The lowest  concentration of SARS-CoV-2 viral copies this assay can detect is 250  copies / mL. A negative result does not preclude SARS-CoV-2 infection  and should not be used as the sole basis for treatment or other  patient management decisions.  A negative result may occur with  improper specimen collection / handling, submission of specimen other  than nasopharyngeal swab, presence of viral mutation(s) within the  areas targeted by this assay, and inadequate number of viral copies  (<250 copies / mL). A negative result must be combined with clinical  observations, patient history, and epidemiological information. If result is POSITIVE SARS-CoV-2 target nucleic acids are DETECTED. The SARS-CoV-2 RNA is generally detectable in upper and lower  respiratory specimens dur ing the acute phase of infection.  Positive  results are indicative of active infection with SARS-CoV-2.  Clinical  correlation with patient history and other diagnostic information is  necessary to determine patient infection status.  Positive results do  not rule out bacterial infection or co-infection with other viruses. If result is PRESUMPTIVE POSTIVE SARS-CoV-2 nucleic acids MAY BE PRESENT.   A presumptive positive result was obtained on the submitted specimen  and confirmed on repeat testing.   While 2019 novel coronavirus  (SARS-CoV-2) nucleic acids may be present in the submitted sample  additional confirmatory testing may be necessary for epidemiological  and / or clinical management purposes  to differentiate between  SARS-CoV-2 and other Sarbecovirus currently known to infect humans.  If clinically indicated additional testing with an alternate test  methodology [phone]) is advised. The SARS-CoV-2 RNA is generally  detectable in upper and lower respiratory sp ecimens during the acute  phase of infection. The expected result is Negative. Fact Sheet for Patients:  StrictlyIdeas.no Fact Sheet for Healthcare Providers: BankingDealers.co.za This test is not yet  approved or cleared by the [place] and has been authorized for detection and/or diagnosis of SARS-CoV-2 by FDA under an Emergency Use Authorization (EUA).  This EUA will remain in effect (meaning this test can be used) for the duration of the COVID-19 declaration under Section 564(b)(1) of the Act, 21 U.S.C. section 360bbb-3(b)(1), unless the authorization is terminated or revoked sooner. Performed at [facility], [place] [address], [place], [place] [address]   MRSA PCR Screening     Status: Abnormal   Collection Time: 05/14/19 11:54 PM  Result Value Ref Range Status   MRSA by PCR POSITIVE (A) NEGATIVE Final    Comment:        The GeneXpert MRSA Assay (FDA approved for NASAL specimens only), is one component of a comprehensive MRSA colonization surveillance program. It is not intended to diagnose MRSA infection nor to guide or monitor treatment for MRSA infections. RESULT CALLED TO, READ BACK BY AND VERIFIED WITH: LANELY,S RN @0303  ON 05/15/2019 JACKSON,K Performed at [facility], [place] [address] [place], [place] [address]   Respiratory Panel by PCR     Status: None   Collection Time: 05/15/19  2:35 AM  Result  Value Ref Range Status   Adenovirus NOT DETECTED NOT DETECTED Final   Coronavirus 229E NOT DETECTED NOT DETECTED Final    Comment: (NOTE) The Coronavirus on the Respiratory Panel, DOES NOT test for the novel  Coronavirus (2019 nCoV)    Coronavirus HKU1 NOT DETECTED NOT DETECTED Final   Coronavirus NL63 NOT DETECTED NOT DETECTED Final   Coronavirus OC43 NOT DETECTED NOT DETECTED Final   Metapneumovirus NOT DETECTED NOT DETECTED Final   Rhinovirus / Enterovirus NOT DETECTED NOT DETECTED Final   Influenza A NOT DETECTED NOT DETECTED Final   Influenza B NOT DETECTED NOT DETECTED Final   Parainfluenza Virus 1 NOT DETECTED NOT DETECTED Final   Parainfluenza Virus 2 NOT DETECTED NOT DETECTED Final   Parainfluenza Virus 3 NOT DETECTED NOT DETECTED Final   Parainfluenza Virus 4 NOT DETECTED NOT DETECTED Final   Respiratory Syncytial Virus NOT DETECTED NOT DETECTED Final   Bordetella pertussis NOT DETECTED NOT DETECTED Final   Chlamydophila pneumoniae NOT DETECTED NOT DETECTED Final   Mycoplasma pneumoniae NOT DETECTED NOT DETECTED Final    Comment: Performed at [facility], [place]. [address] [place], [place] [address]  SARS Coronavirus 2 [facility] order, Performed in [facility] hospital lab)     Status: None   Collection Time: 05/15/19  9:03 AM  Result Value Ref Range Status   SARS Coronavirus 2 NEGATIVE NEGATIVE Final    Comment: (NOTE) If result is NEGATIVE SARS-CoV-2 target nucleic acids are NOT DETECTED. The SARS-CoV-2 RNA is generally detectable in upper and lower  respiratory specimens during the acute phase of infection. The lowest  concentration of SARS-CoV-2 viral copies this assay can detect is 250  copies / mL. A negative result does not preclude SARS-CoV-2 infection  and should not be used as the sole basis for treatment or other  patient management decisions.  A negative result may occur with  improper specimen collection / handling, submission of specimen other  than  nasopharyngeal swab, presence of viral mutation(s) within the  areas targeted by this assay, and inadequate number of viral copies  (<250 copies / mL). A negative result must be combined with clinical  observations, patient history, and epidemiological information. If result is POSITIVE SARS-CoV-2 target nucleic acids are DETECTED. The SARS-CoV-2  RNA is generally detectable in upper and lower  respiratory specimens dur ing the acute phase of infection.  Positive  results are indicative of active infection with SARS-CoV-2.  Clinical  correlation with patient history and other diagnostic information is  necessary to determine patient infection status.  Positive results do  not rule out bacterial infection or co-infection with other viruses. If result is PRESUMPTIVE POSTIVE SARS-CoV-2 nucleic acids MAY BE PRESENT.   A presumptive positive result was obtained on the submitted specimen  and confirmed on repeat testing.  While 2019 novel coronavirus  (SARS-CoV-2) nucleic acids may be present in the submitted sample  additional confirmatory testing may be necessary for epidemiological  and / or clinical management purposes  to differentiate between  SARS-CoV-2 and other Sarbecovirus currently known to infect humans.  If clinically indicated additional testing with an alternate test  methodology [phone]) is advised. The SARS-CoV-2 RNA is generally  detectable in upper and lower respiratory sp ecimens during the acute  phase of infection. The expected result is Negative. Fact Sheet for Patients:  StrictlyIdeas.no Fact Sheet for Healthcare Providers: BankingDealers.co.za This test is not yet approved or cleared by the [place] FDA and has been authorized for detection and/or diagnosis of SARS-CoV-2 by FDA under an Emergency Use Authorization (EUA).  This EUA will remain in effect (meaning this test can be used) for the duration of  the COVID-19 declaration under Section 564(b)(1) of the Act, 21 U.S.C. section 360bbb-3(b)(1), unless the authorization is terminated or revoked sooner. Performed at [facility], [place] [address], [place], [place] [address]          Radiology Studies: Ct Angio Chest Pe W Or Wo Contrast  Result Date: 05/15/2019 CLINICAL DATA:  Patient admitted 05/14/2019 for shortness of breath and decreased oxygen saturation. History of COPD. EXAM: CT ANGIOGRAPHY CHEST WITH CONTRAST TECHNIQUE: Multidetector CT imaging of the chest was performed using the standard protocol during bolus administration of intravenous contrast. Multiplanar CT image reconstructions and MIPs were obtained to evaluate the vascular anatomy. CONTRAST:  100 mL OMNIPAQUE IOHEXOL 350 MG/ML SOLN COMPARISON:  Single-view of the chest 05/14/2019, 01/10/2019 and 11/26/2016. FINDINGS: Cardiovascular: The study is somewhat limited by bolus timing but no pulmonary embolus is identified. There is cardiomegaly. No pericardial effusion. No aortic aneurysm. Mediastinum/Nodes: No lymphadenopathy. Thyromegaly is most notable in the left lobe. The esophagus is unremarkable. Lungs/Pleura: Small bilateral pleural effusions greater on the left. Dependent bilateral lower lobe airspace disease with air bronchograms is seen. Scattered ground-glass attenuation and mild interlobular septal thickening are identified. Upper Abdomen: There is fatty infiltration of the liver and hepatomegaly. The patient is status post cholecystectomy. Musculoskeletal: No acute or focal bony abnormality. Review of the MIP images confirms the above findings. IMPRESSION: Negative for pulmonary embolus. Dependent bilateral lower lobe airspace disease could be due to pneumonia, atelectasis or aspiration. Cardiomegaly and findings compatible with mild interstitial pulmonary edema. Small pleural effusions noted. Hepatomegaly and fatty infiltration of the liver. Electronically  Signed   By: Inge Rise M.D.   On: 05/15/2019 15:03   Dg Chest Portable 1 View  Result Date: 05/14/2019 CLINICAL DATA:  Low oxygen saturation. EXAM: PORTABLE CHEST 1 VIEW COMPARISON:  01/10/2019 and multiple previous FINDINGS: The patient is rotated towards the right as usual. Cardiac silhouette is enlarged. There is diffuse pulmonary density which appear slightly more prominent than seen previously. This could reflect pulmonary edema superimposed on chronic lung disease. No dense consolidation or lobar collapse. IMPRESSION:  Rotated. Cardiomegaly. Diffuse lung density, similar to previous studies but possibly slightly more pronounced. This could represent mild edema superimposed upon chronic lung disease. Electronically Signed   By: Nelson Chimes M.D.   On: 05/14/2019 19:11        Scheduled Meds:  Chlorhexidine Gluconate Cloth  6 each Topical Q0600   cholecalciferol  1,000 Units Oral Daily   enoxaparin (LOVENOX) injection  40 mg Subcutaneous Daily   fluticasone  1 spray Each Nare Daily   fluticasone furoate-vilanterol  1 puff Inhalation Daily   furosemide  80 mg Intravenous BID   ipratropium-albuterol  3 mL Nebulization QID   loratadine  10 mg Oral Daily   mouth rinse  15 mL Mouth Rinse q12n4p   methylPREDNISolone (SOLU-MEDROL) injection  80 mg Intravenous Q8H   metoprolol tartrate  5 mg Intravenous Q8H   multivitamin  15 mL Oral Daily   mupirocin ointment  1 application Nasal BID   omega-3 acid ethyl esters  1 g Oral Daily   pantoprazole (PROTONIX) IV  40 mg Intravenous Daily   senna  2 tablet Oral QHS   sodium fluoride   dental Daily   vitamin C  3,000 mg Oral Daily   Continuous Infusions:  levofloxacin (LEVAQUIN) IV       LOS: 1 day    Time spent: 35 mins    Irine Seal, MD [facility]  If 7PM-7AM, please contact night-coverage [email or web address] 05/15/2019, 4:20 PM

## 2019-05-15 NOTE — TOC Progression Note (Signed)
Transition of Care [facility]) - Progression Note    Patient Details  Name: Kathleen Roy MRN: [id number] Date of Birth: May 24, 1961  [facility]) CM/SW Contact  Servando Snare, [name] Phone Number: 05/15/2019, 8:49 AM  Clinical Narrative:  [facility] dept consulted for COPD. Patient does not meet the criteria for screening as patient has not had 3 visits for COPD in the past 6 months. [name] signing off.               Expected Discharge Plan and Services                                                 Social Determinants of Health (SDOH) Interventions    Readmission Risk Interventions No flowsheet data found.

## 2019-05-16 ENCOUNTER — Inpatient Hospital Stay (HOSPITAL_COMMUNITY): Payer: Medicare Other

## 2019-05-16 DIAGNOSIS — I5033 Acute on chronic diastolic (congestive) heart failure: Secondary | ICD-10-CM

## 2019-05-16 LAB — BASIC METABOLIC PANEL
Anion gap: 13 (ref 5–15)
BUN: 16 mg/dL (ref 6–20)
CO2: 46 mmol/L — ABNORMAL HIGH (ref 22–32)
Calcium: 9.7 mg/dL (ref 8.9–10.3)
Chloride: 90 mmol/L — ABNORMAL LOW (ref 98–111)
Creatinine, Ser: 0.89 mg/dL (ref 0.44–1.00)
GFR calc Af Amer: >60 mL/min (ref >60)
GFR calc non Af Amer: >60 mL/min (ref >60)
Glucose, Bld: 154 mg/dL — ABNORMAL HIGH (ref 70–99)
Potassium: 4 mmol/L (ref 3.5–5.1)
Sodium: 149 mmol/L — ABNORMAL HIGH (ref 135–145)

## 2019-05-16 LAB — BLOOD GAS, ARTERIAL
Acid-Base Excess: 21.6 mmol/L — ABNORMAL HIGH (ref 0.0–2.0)
Bicarbonate: 48.1 mmol/L — ABNORMAL HIGH (ref 20.0–28.0)
Delivery systems: POSITIVE
Drawn by: 331471
Expiratory PAP: 10
FIO2: 70
Inspiratory PAP: 22
O2 Saturation: 94 %
Patient temperature: 98.6
pCO2 arterial: 60.8 mmHg — ABNORMAL HIGH (ref 32.0–48.0)
pH, Arterial: 7.51 — ABNORMAL HIGH (ref 7.350–7.450)
pO2, Arterial: 60.1 mmHg — ABNORMAL LOW (ref 83.0–108.0)

## 2019-05-16 LAB — CBC WITH DIFFERENTIAL/PLATELET
Abs Immature Granulocytes: 0.13 10*3/uL — ABNORMAL HIGH (ref 0.00–0.07)
Basophils Absolute: 0 10*3/uL (ref 0.0–0.1)
Basophils Relative: 0 %
Eosinophils Absolute: 0 10*3/uL (ref 0.0–0.5)
Eosinophils Relative: 0 %
HCT: 31.4 % — ABNORMAL LOW (ref 36.0–46.0)
Hemoglobin: 9.1 g/dL — ABNORMAL LOW (ref 12.0–15.0)
Immature Granulocytes: 2 %
Lymphocytes Relative: 9 %
Lymphs Abs: 0.5 10*3/uL — ABNORMAL LOW (ref 0.7–4.0)
MCH: 29.7 pg (ref 26.0–34.0)
MCHC: 29 g/dL — ABNORMAL LOW (ref 30.0–36.0)
MCV: 102.6 fL — ABNORMAL HIGH (ref 80.0–100.0)
Monocytes Absolute: 0.1 10*3/uL (ref 0.1–1.0)
Monocytes Relative: 2 %
Neutro Abs: 4.9 10*3/uL (ref 1.7–7.7)
Neutrophils Relative %: 87 %
Platelets: 213 10*3/uL (ref 150–400)
RBC: 3.06 MIL/uL — ABNORMAL LOW (ref 3.87–5.11)
RDW: 17.1 % — ABNORMAL HIGH (ref 11.5–15.5)
WBC: 5.7 10*3/uL (ref 4.0–10.5)
nRBC: 0.3 % — ABNORMAL HIGH (ref 0.0–0.2)

## 2019-05-16 LAB — ECHOCARDIOGRAM COMPLETE
Height: 62 in
Weight: 5040.6 oz

## 2019-05-16 LAB — TROPONIN I: Troponin I: <0.03 ng/mL (ref <0.03)

## 2019-05-16 LAB — HEMOGLOBIN A1C
Hgb A1c MFr Bld: 5.5 % (ref 4.8–5.6)
Mean Plasma Glucose: 111.15 mg/dL

## 2019-05-16 LAB — HIV ANTIBODY (ROUTINE TESTING W REFLEX): HIV Screen 4th Generation wRfx: NONREACTIVE

## 2019-05-16 LAB — GLUCOSE, CAPILLARY: Glucose-Capillary: 162 mg/dL — ABNORMAL HIGH (ref 70–99)

## 2019-05-16 MED ORDER — METHYLPREDNISOLONE SODIUM SUCC 40 MG IJ SOLR
40.0000 mg | Freq: Three times a day (TID) | INTRAMUSCULAR | Status: DC
  Administered 2019-05-16 – 2019-05-19 (×9): 40 mg via INTRAVENOUS
  Filled 2019-05-16 (×8): qty 1

## 2019-05-16 MED ORDER — PERFLUTREN LIPID MICROSPHERE
1.0000 mL | INTRAVENOUS | Status: AC | PRN
  Administered 2019-05-16: 3 mL via INTRAVENOUS
  Filled 2019-05-16: qty 10

## 2019-05-16 MED ORDER — VENLAFAXINE HCL ER 150 MG PO CP24
300.0000 mg | ORAL_CAPSULE | Freq: Every day | ORAL | Status: DC
  Administered 2019-05-16 – 2019-05-23 (×8): 300 mg via ORAL
  Filled 2019-05-16 (×8): qty 2

## 2019-05-16 MED ORDER — VENLAFAXINE HCL ER 150 MG PO CP24: 300.0000 mg | ORAL_CAPSULE | Freq: Every day | ORAL | Status: DC

## 2019-05-16 MED ORDER — VENLAFAXINE HCL ER 37.5 MG PO CP24
37.5000 mg | ORAL_CAPSULE | Freq: Every day | ORAL | Status: DC
  Administered 2019-05-16 – 2019-05-23 (×8): 37.5 mg via ORAL
  Filled 2019-05-16 (×9): qty 1

## 2019-05-16 MED ORDER — LAMOTRIGINE 25 MG PO TABS
25.0000 mg | ORAL_TABLET | Freq: Every day | ORAL | Status: DC
  Administered 2019-05-16 – 2019-05-23 (×8): 25 mg via ORAL
  Filled 2019-05-16 (×9): qty 1

## 2019-05-16 NOTE — Progress Notes (Addendum)
Rt took pt off BIPAP and placed on 8 LPM HF due to ABG. No distress noted at this time.

## 2019-05-16 NOTE — Progress Notes (Signed)
  Echocardiogram 2D Echocardiogram has been performed.  Kathleen Roy 05/16/2019, 1:14 PM

## 2019-05-16 NOTE — Progress Notes (Signed)
NAME:  Kathleen Roy, MRN:  [id number], DOB:  [birth year], LOS: 2 ADMISSION DATE:  05/14/2019, CONSULTATION DATE:  05/15/2019 REFERRING MD:  Dr. Grandville Silos, [facility], CHIEF COMPLAINT:  Short of breath   Brief History   58 yo female from [place] assisted living presented with dyspnea and worsening hypoxia felt to be from COPD exacerbation in setting of OSA/OHS.  PCCM asked to assist with management of respiratory failure needing Bipap.  Past Medical History  COPD, chronic respiratory failure on 4 liters, HTN, HLD, DM, diastolic CHF, OSA/OHS, migraine HA, fibromyalgia, memory loss  Significant Hospital Events   5/29 Admit  Consults:    Procedures:    Significant Diagnostic Tests:  CT angio chest 5/30 >> thyromegaly, small b/l effusions, scattered GGO, b/l lower lobe ASD with air bronchograms (reviewed by me) Echo 5/31 >>   Micro Data:  COVID 5/29 >> negative COVID 5/30 >> negative RVP 5/30 >> negative  Antimicrobials:  levaquin 5/30 >>  Interim history/subjective:  Was on Bipap last night.  Off since 9 am this AM.  Objective   Blood pressure 138/70, pulse (!) 112, temperature 98 F (36.7 C), temperature source Axillary, resp. rate (!) 30, height 5\' 2"  (1.575 m), weight (!) 142.9 kg, SpO2 (!) 89 %.    FiO2 (%):  [70 %] 70 %   Intake/Output Summary (Last 24 hours) at 05/16/2019 1136 Last data filed at 05/16/2019 0600 Gross per 24 hour  Intake 589.08 ml  Output 2550 ml  Net -1960.92 ml   Filed Weights   05/14/19 2340 05/15/19 0500 05/16/19 0500  Weight: (!) 146.4 kg (!) 146.8 kg (!) 142.9 kg    Examination:  General - alert Eyes - pupils reactive ENT - no sinus tenderness, no stridor Cardiac - regular rate/rhythm, no murmur Chest - decreased BS, scattered crackles Abdomen - soft, non tender, + bowel sounds Extremities - 2+ edema Skin - chronic venous stasis changes Neuro - mildly confused, follows simple commands, moves extremities   Resolved Hospital Problem  list     Assessment & Plan:   Acute on chronic hypoxic/hypercapnic respiratory from COPD exacerbation and pneumonia. Hx of OSA/OHS. Plan - f/u CXR intermittently  - day 2 of ABx - goal SpO2 88 to 95% - Bipap qhs and prn - continue Breo - scheduled duoneb - change solumedrol to 40 mg q8h  Acute on chronic diastolic CHF. Plan - f/u Echo  Metabolic alkalosis with hypernatremia >> likely contraction alkalosis. Plan - hold diuresis for now - f/u BMET  Hx of depression, memory loss, fibromyalgia. Plan - per primary team  Best practice:  Diet: NPO DVT prophylaxis: Lovenox GI prophylaxis: protonix Mobility: as tolerated Code Status: DNR Disposition: ICU  Labs    CMP Latest Ref Rng & Units 05/16/2019 05/15/2019 05/14/2019  Glucose 70 - 99 mg/dL 154(H) 141(H) 137(H)  BUN 6 - 20 mg/dL 16 15 13   Creatinine 0.44 - 1.00 mg/dL 0.89 0.74 0.68  Sodium 135 - 145 mmol/L 149(H) 147(H) 148(H)  Potassium 3.5 - 5.1 mmol/L 4.0 3.0(L) 3.9  Chloride 98 - 111 mmol/L 90(L) 85(L) 86(L)  CO2 22 - 32 mmol/L 46(H) 49(H) >50(H)  Calcium 8.9 - 10.3 mg/dL 9.7 9.1 9.3  Total Protein 6.5 - 8.1 g/dL - - 7.3  Total Bilirubin 0.3 - 1.2 mg/dL - - 0.5  Alkaline Phos 38 - 126 U/L - - 87  AST 15 - 41 U/L - - 17  ALT 0 - 44 U/L - - 16  CBC Latest Ref Rng & Units 05/16/2019 05/15/2019 05/14/2019  WBC 4.0 - 10.5 K/uL 5.7 6.1 6.4  Hemoglobin 12.0 - 15.0 g/dL 9.1(L) 8.9(L) 9.3(L)  Hematocrit 36.0 - 46.0 % 31.4(L) 32.2(L) 34.7(L)  Platelets 150 - 400 K/uL 213 170 177   CBG (last 3)  Recent Labs    05/15/19 0830 05/16/19 0755  GLUCAP 148* 162*   ABG    Component Value Date/Time   PHART 7.510 (H) 05/16/2019 0854   PCO2ART 60.8 (H) 05/16/2019 0854   PO2ART 60.1 (L) 05/16/2019 0854   HCO3 48.1 (H) 05/16/2019 0854   TCO2 44 (H) 11/11/2017 0101   O2SAT 94.0 05/16/2019 [place], MD [place] [facility] 05/16/2019, 11:52 AM

## 2019-05-16 NOTE — Progress Notes (Signed)
PROGRESS NOTE    Kathleen Roy  ZOX:[id number] DOB: 04-12-1961 DOA: 05/14/2019 PCP: [name], MD    Brief Narrative:  Per Dr [name] is a 58 y.o. female with medical history significant of COPD, chronic respiratory failure on 4 L nasal cannula oxygen, HTN, HLD, diet-controled DM, dCHF, morbid obesity, OSA, hypoventilation syndrome, migraine headache, fibromyalgia, memory loss, who presents with SOB and oxygen desaturation.  Per report, pt was found to have oxygen desaturation to low 80s on baseline 4 L nasal cannula oxygen in the facility. Pt is placed on NRB at 15 L O2- 95%. She has hx of memory loss. She is mildly confused. She response to calling her name, but is not oriented to place and time.  She does not complain of any pain.  Does not seem to have chest pain or abdominal pain.  No active cough, nausea, vomiting, diarrhea noted. Not sure if pt has symptoms of UTI.  She moves all extremities on painful stimuli.  No facial droop.  ED Course: pt was found to have BNP 36.9, negative COVID-19 test, pending WBC, negative trop, renal function normal, temperature normal, heart rate in 90s, RR 25, oxygen 95% on BiPAP. VBG with pH 7.301, CO2 118, O2 49.1.  Patient is admitted to stepdown bed as inpatient.  # CXR showed:  Cardiomegaly. Diffuse lung density, similar to previous studies but possibly slightly more pronounced. This could represent mild edema superimposed upon chronic lung disease  Assessment & Plan:   Principal Problem:   Acute on chronic respiratory failure with hypoxia and hypercapnia (HCC) Active Problems:   Major depressive disorder, recurrent episode (HCC)   COPD (chronic obstructive pulmonary disease) (HCC)   Hypokalemia   OSA (obstructive sleep apnea)   Obesity hypoventilation syndrome (HCC)   Chronic diastolic congestive heart failure (HCC)   Essential hypertension   Diabetes mellitus without complication (HCC)   Macrocytic anemia   Acute metabolic  encephalopathy   Acute on chronic diastolic congestive heart failure (HCC)   Hypernatremia   1 acute on chronic respiratory failure with hypoxia and hypercapnia  Likely multifactorial secondary to probable acute COPD exacerbation, +/-CHF exacerbation in the setting of OSA and hypoventilation syndrome.  Concern for COVID-19.  Patient is morbidly obese, patient with decreased air movement, difficult to assess volume status.  Chest x-ray showing possible mild pulmonary edema.  Patient with increased O2 requirements was 80% on 4 L home O2 on presentation, requiring nonrebreather and now on BiPAP.  Patient in the stepdown unit.  Patient somewhat lethargic on examination.  SARS-CoV-2 was negative on admission however due to patient's increasing O2 requirements was on BiPAP concern for COVID.  Repeat SARS-CoV-2 test is negative.  Respiratory viral panel negative.  Airborne precautions have been discontinued.  T chest which was done showed thyromegaly, small bilateral effusions, scattered GGO, bilateral lower lobe airspace disease with air bronchograms.  Continue IV Levaquin, IV steroid taper, scheduled nebs.  Discontinue IV Lasix due to worsening sodium levels.  PCCM following and appreciate input and recommendations..   2.  Probable acute COPD exacerbation Patient with poor air movement.  Patient with acute on chronic respiratory failure was on BiPAP and now on 10 L high flow O2.  ABG somewhat improving with a pH of 7.510 this morning from 7.462 from 7.301.  PCO2 decreasing and currently at 60.8.  PO2 of 60.1.  Continue IV Levaquin, IV steroid taper, Flonase, IV PPI, nebulizer treatments.  PCCM following and appreciate input and  recommendations..    3.  Probable acute on chronic diastolic CHF Patient presented with problem #1.  2D echo on 08/20/2018 with a EF of 60 to 65% with grade 2 diastolic dysfunction.  Patient on admission noted to have some trace lower extremity edema.  Chest x-ray with concern for  possible mild pulmonary edema.  Patient was on torsemide twice daily.  Patient currently on Lasix 80 mg IV every 12 hours.  Patient with a worsening hypernatremia.  Patient looks clinically dehydrated on examination.  Will discontinue IV Lasix for now.  Strict I's and O's.  Daily weights.  2D echo pending.   4.  Major depressive disorder recurrent Patient on oral Effexor.  Patient was lethargic and as such Effexor has been held.  Patient more alert today.  Will resume home dose Effexor.   5.  Acute metabolic encephalopathy and history of memory loss Likely secondary to problem #1.  Patient with no signs of source of infection noted.  See #1.  ABG with decreasing PCO2.  pH has increased to 7.510.  Patient alert however confused.  Continue treatment for probable acute COPD exacerbation with nebulizer treatments, IV steroid taper, IV Levaquin.  Follow.    6.  Obstructive sleep apnea/obesity hypoventilation syndrome Will likely need CPAP nightly..  7.  Hypertension Discontinued Toprol-XL and placed on IV Lopressor 5 mg every 8 hours.  8.  Well-controlled diabetes mellitus type 2 Last hemoglobin A1c was 4.9 on 09/20/2017.  Hemoglobin A1c 5.5.  Outpatient follow-up.   9.  Microcytic anemia H&H stable.  Follow.  10 hypokalemia Repleted.  Potassium of 4.0.  Magnesium at 2.0.  11.  Hypernatremia Likely secondary to diuresis.  Will discontinue IV Lasix for now.  Follow.     DVT prophylaxis: Lovenox Code Status: DNR Family Communication: No family at bedside. Disposition Plan: Remain in stepdown.  Patient lethargic.  Patient on 10 L high flow O2..   Consultants:   PCCM: Dr. Ander Slade 05/15/2019  Procedures:   CT angiogram chest 05/15/2019  Chest x-ray 05/14/2019  2D echo pending 05/16/2019    Antimicrobials:   IV Levaquin 05/15/2019   Subjective: Patient off BiPAP and on 10 L High flow oxygen. Just keeps repeating 125.    Objective: Vitals:   05/16/19 0400 05/16/19 0500  05/16/19 0600 05/16/19 0902  BP: (!) 115/56 (!) 96/50 (!) 94/57   Pulse: (!) 109 (!) 104 (!) 109   Resp: 17 19 19  (!) 21  Temp: 98 F (36.7 C)     TempSrc: Axillary     SpO2: 95% 93% 95%   Weight:  (!) 142.9 kg    Height:        Intake/Output Summary (Last 24 hours) at 05/16/2019 1058 Last data filed at 05/16/2019 0600 Gross per 24 hour  Intake 589.08 ml  Output 2550 ml  Net -1960.92 ml   Filed Weights   05/14/19 2340 05/15/19 0500 05/16/19 0500  Weight: (!) 146.4 kg (!) 146.8 kg (!) 142.9 kg    Examination:  General exam: Patient on 10 L high flow nasal cannula.  Alert and confused.  Poor dentition.  Dry mucous membranes. Respiratory system: Diminished breath sounds in the bases.  Scattered coarse breath sounds.  On 10 L high flow nasal cannula.  Cardiovascular system: Distant heart sounds secondary to body habitus.  S1 & S2 heard, RRR. No JVD, murmurs, rubs, gallops or clicks. No pedal edema. Gastrointestinal system: Abdomen is obese, soft, nondistended, nontender, positive bowel sounds.  Central nervous  system: Off BiPAP and on 10 L high flow nasal cannula.  Confused.  Moving extremities spontaneously.  Lethargic.  Extremities: Symmetric 5 x 5 power. Skin: No rashes, lesions or ulcers Psychiatry: Judgement and insight appear poor. Mood & affect appropriate.     Data Reviewed: I have personally reviewed following labs and imaging studies  CBC: Recent Labs  Lab 05/14/19 1802 05/15/19 1001 05/16/19 0512  WBC 6.4 6.1 5.7  NEUTROABS 4.7 4.6 4.9  HGB 9.3* 8.9* 9.1*  HCT 34.7* 32.2* 31.4*  MCV 107.8* 105.2* 102.6*  PLT 177 170 932   Basic Metabolic Panel: Recent Labs  Lab 05/14/19 1802 05/15/19 1001 05/16/19 0221  NA 148* 147* 149*  K 3.9 3.0* 4.0  CL 86* 85* 90*  CO2 >50* 49* 46*  GLUCOSE 137* 141* 154*  BUN 13 15 16   CREATININE 0.68 0.74 0.89  CALCIUM 9.3 9.1 9.7  MG  --  2.0  --    GFR: Estimated Creatinine Clearance: 97.2 mL/min (by C-G formula based  on SCr of 0.89 mg/dL). Liver Function Tests: Recent Labs  Lab 05/14/19 1802  AST 17  ALT 16  ALKPHOS 87  BILITOT 0.5  PROT 7.3  ALBUMIN 3.4*   No results for input(s): LIPASE, AMYLASE in the last 168 hours. No results for input(s): AMMONIA in the last 168 hours. Coagulation Profile: No results for input(s): INR, PROTIME in the last 168 hours. Cardiac Enzymes: Recent Labs  Lab 05/14/19 1802 05/16/19 0221  TROPONINI <0.03 <0.03   BNP (last 3 results) No results for input(s): PROBNP in the last 8760 hours. HbA1C: Recent Labs    05/16/19 0221  HGBA1C 5.5   CBG: Recent Labs  Lab 05/15/19 0830 05/16/19 0755  GLUCAP 148* 162*   Lipid Profile: No results for input(s): CHOL, HDL, LDLCALC, TRIG, CHOLHDL, LDLDIRECT in the last 72 hours. Thyroid Function Tests: No results for input(s): TSH, T4TOTAL, FREET4, T3FREE, THYROIDAB in the last 72 hours. Anemia Panel: Recent Labs    05/15/19 1001  FERRITIN 153   Sepsis Labs: Recent Labs  Lab 05/14/19 1802 05/15/19 0253  LATICACIDVEN 0.8 1.2    Recent Results (from the past 240 hour(s))  SARS Coronavirus 2 (CEPHEID- Performed in [facility] hospital lab), Hosp Order     Status: None   Collection Time: 05/14/19  6:22 PM  Result Value Ref Range Status   SARS Coronavirus 2 NEGATIVE NEGATIVE Final    Comment: (NOTE) If result is NEGATIVE SARS-CoV-2 target nucleic acids are NOT DETECTED. The SARS-CoV-2 RNA is generally detectable in upper and lower  respiratory specimens during the acute phase of infection. The lowest  concentration of SARS-CoV-2 viral copies this assay can detect is 250  copies / mL. A negative result does not preclude SARS-CoV-2 infection  and should not be used as the sole basis for treatment or other  patient management decisions.  A negative result may occur with  improper specimen collection / handling, submission of specimen other  than nasopharyngeal swab, presence of viral mutation(s) within the   areas targeted by this assay, and inadequate number of viral copies  (<250 copies / mL). A negative result must be combined with clinical  observations, patient history, and epidemiological information. If result is POSITIVE SARS-CoV-2 target nucleic acids are DETECTED. The SARS-CoV-2 RNA is generally detectable in upper and lower  respiratory specimens dur ing the acute phase of infection.  Positive  results are indicative of active infection with SARS-CoV-2.  Clinical  correlation with  patient history and other diagnostic information is  necessary to determine patient infection status.  Positive results do  not rule out bacterial infection or co-infection with other viruses. If result is PRESUMPTIVE POSTIVE SARS-CoV-2 nucleic acids MAY BE PRESENT.   A presumptive positive result was obtained on the submitted specimen  and confirmed on repeat testing.  While 2019 novel coronavirus  (SARS-CoV-2) nucleic acids may be present in the submitted sample  additional confirmatory testing may be necessary for epidemiological  and / or clinical management purposes  to differentiate between  SARS-CoV-2 and other Sarbecovirus currently known to infect humans.  If clinically indicated additional testing with an alternate test  methodology [phone]) is advised. The SARS-CoV-2 RNA is generally  detectable in upper and lower respiratory sp ecimens during the acute  phase of infection. The expected result is Negative. Fact Sheet for Patients:  StrictlyIdeas.no Fact Sheet for Healthcare Providers: BankingDealers.co.za This test is not yet approved or cleared by the [place] FDA and has been authorized for detection and/or diagnosis of SARS-CoV-2 by FDA under an Emergency Use Authorization (EUA).  This EUA will remain in effect (meaning this test can be used) for the duration of the COVID-19 declaration under Section 564(b)(1) of the Act, 21 U.S.C.  section 360bbb-3(b)(1), unless the authorization is terminated or revoked sooner. Performed at [facility], [place] [address], [place], [place] [address]   MRSA PCR Screening     Status: Abnormal   Collection Time: 05/14/19 11:54 PM  Result Value Ref Range Status   MRSA by PCR POSITIVE (A) NEGATIVE Final    Comment:        The GeneXpert MRSA Assay (FDA approved for NASAL specimens only), is one component of a comprehensive MRSA colonization surveillance program. It is not intended to diagnose MRSA infection nor to guide or monitor treatment for MRSA infections. RESULT CALLED TO, READ BACK BY AND VERIFIED WITH: LANELY,S RN @0303  ON 05/15/2019 JACKSON,K Performed at [facility] [place] [facility], [place] [address] [place], [place] [address]   Respiratory Panel by PCR     Status: None   Collection Time: 05/15/19  2:35 AM  Result Value Ref Range Status   Adenovirus NOT DETECTED NOT DETECTED Final   Coronavirus 229E NOT DETECTED NOT DETECTED Final    Comment: (NOTE) The Coronavirus on the Respiratory Panel, DOES NOT test for the novel  Coronavirus (2019 nCoV)    Coronavirus HKU1 NOT DETECTED NOT DETECTED Final   Coronavirus NL63 NOT DETECTED NOT DETECTED Final   Coronavirus OC43 NOT DETECTED NOT DETECTED Final   Metapneumovirus NOT DETECTED NOT DETECTED Final   Rhinovirus / Enterovirus NOT DETECTED NOT DETECTED Final   Influenza A NOT DETECTED NOT DETECTED Final   Influenza B NOT DETECTED NOT DETECTED Final   Parainfluenza Virus 1 NOT DETECTED NOT DETECTED Final   Parainfluenza Virus 2 NOT DETECTED NOT DETECTED Final   Parainfluenza Virus 3 NOT DETECTED NOT DETECTED Final   Parainfluenza Virus 4 NOT DETECTED NOT DETECTED Final   Respiratory Syncytial Virus NOT DETECTED NOT DETECTED Final   Bordetella pertussis NOT DETECTED NOT DETECTED Final   Chlamydophila pneumoniae NOT DETECTED NOT DETECTED Final   Mycoplasma pneumoniae NOT DETECTED NOT DETECTED Final     Comment: Performed at [facility], [place]. [address], [place], [place] [address]  SARS Coronavirus 2 [facility] order, Performed in [facility] hospital lab)     Status: None   Collection Time: 05/15/19  9:03 AM  Result  Value Ref Range Status   SARS Coronavirus 2 NEGATIVE NEGATIVE Final    Comment: (NOTE) If result is NEGATIVE SARS-CoV-2 target nucleic acids are NOT DETECTED. The SARS-CoV-2 RNA is generally detectable in upper and lower  respiratory specimens during the acute phase of infection. The lowest  concentration of SARS-CoV-2 viral copies this assay can detect is 250  copies / mL. A negative result does not preclude SARS-CoV-2 infection  and should not be used as the sole basis for treatment or other  patient management decisions.  A negative result may occur with  improper specimen collection / handling, submission of specimen other  than nasopharyngeal swab, presence of viral mutation(s) within the  areas targeted by this assay, and inadequate number of viral copies  (<250 copies / mL). A negative result must be combined with clinical  observations, patient history, and epidemiological information. If result is POSITIVE SARS-CoV-2 target nucleic acids are DETECTED. The SARS-CoV-2 RNA is generally detectable in upper and lower  respiratory specimens dur ing the acute phase of infection.  Positive  results are indicative of active infection with SARS-CoV-2.  Clinical  correlation with patient history and other diagnostic information is  necessary to determine patient infection status.  Positive results do  not rule out bacterial infection or co-infection with other viruses. If result is PRESUMPTIVE POSTIVE SARS-CoV-2 nucleic acids MAY BE PRESENT.   A presumptive positive result was obtained on the submitted specimen  and confirmed on repeat testing.  While 2019 novel coronavirus  (SARS-CoV-2) nucleic acids may be present in the submitted sample  additional confirmatory  testing may be necessary for epidemiological  and / or clinical management purposes  to differentiate between  SARS-CoV-2 and other Sarbecovirus currently known to infect humans.  If clinically indicated additional testing with an alternate test  methodology [phone]) is advised. The SARS-CoV-2 RNA is generally  detectable in upper and lower respiratory sp ecimens during the acute  phase of infection. The expected result is Negative. Fact Sheet for Patients:  StrictlyIdeas.no Fact Sheet for Healthcare Providers: BankingDealers.co.za This test is not yet approved or cleared by the [place] FDA and has been authorized for detection and/or diagnosis of SARS-CoV-2 by FDA under an Emergency Use Authorization (EUA).  This EUA will remain in effect (meaning this test can be used) for the duration of the COVID-19 declaration under Section 564(b)(1) of the Act, 21 U.S.C. section 360bbb-3(b)(1), unless the authorization is terminated or revoked sooner. Performed at [facility], [place] [address] [place], [place] [address]          Radiology Studies: Ct Angio Chest Pe W Or Wo Contrast  Result Date: 05/15/2019 CLINICAL DATA:  Patient admitted 05/14/2019 for shortness of breath and decreased oxygen saturation. History of COPD. EXAM: CT ANGIOGRAPHY CHEST WITH CONTRAST TECHNIQUE: Multidetector CT imaging of the chest was performed using the standard protocol during bolus administration of intravenous contrast. Multiplanar CT image reconstructions and MIPs were obtained to evaluate the vascular anatomy. CONTRAST:  100 mL OMNIPAQUE IOHEXOL 350 MG/ML SOLN COMPARISON:  Single-view of the chest 05/14/2019, 01/10/2019 and 11/26/2016. FINDINGS: Cardiovascular: The study is somewhat limited by bolus timing but no pulmonary embolus is identified. There is cardiomegaly. No pericardial effusion. No aortic aneurysm. Mediastinum/Nodes: No  lymphadenopathy. Thyromegaly is most notable in the left lobe. The esophagus is unremarkable. Lungs/Pleura: Small bilateral pleural effusions greater on the left. Dependent bilateral lower lobe airspace disease with air bronchograms is seen. Scattered ground-glass attenuation and mild interlobular  septal thickening are identified. Upper Abdomen: There is fatty infiltration of the liver and hepatomegaly. The patient is status post cholecystectomy. Musculoskeletal: No acute or focal bony abnormality. Review of the MIP images confirms the above findings. IMPRESSION: Negative for pulmonary embolus. Dependent bilateral lower lobe airspace disease could be due to pneumonia, atelectasis or aspiration. Cardiomegaly and findings compatible with mild interstitial pulmonary edema. Small pleural effusions noted. Hepatomegaly and fatty infiltration of the liver. Electronically Signed   By: Inge Rise M.D.   On: 05/15/2019 15:03   Dg Chest Portable 1 View  Result Date: 05/14/2019 CLINICAL DATA:  Low oxygen saturation. EXAM: PORTABLE CHEST 1 VIEW COMPARISON:  01/10/2019 and multiple previous FINDINGS: The patient is rotated towards the right as usual. Cardiac silhouette is enlarged. There is diffuse pulmonary density which appear slightly more prominent than seen previously. This could reflect pulmonary edema superimposed on chronic lung disease. No dense consolidation or lobar collapse. IMPRESSION: Rotated. Cardiomegaly. Diffuse lung density, similar to previous studies but possibly slightly more pronounced. This could represent mild edema superimposed upon chronic lung disease. Electronically Signed   By: Nelson Chimes M.D.   On: 05/14/2019 19:11        Scheduled Meds: . Chlorhexidine Gluconate Cloth  6 each Topical Q0600  . cholecalciferol  1,000 Units Oral Daily  . enoxaparin (LOVENOX) injection  40 mg Subcutaneous Daily  . fluticasone  1 spray Each Nare Daily  . fluticasone furoate-vilanterol  1 puff  Inhalation Daily  . ipratropium-albuterol  3 mL Nebulization QID  . loratadine  10 mg Oral Daily  . mouth rinse  15 mL Mouth Rinse q12n4p  . methylPREDNISolone (SOLU-MEDROL) injection  80 mg Intravenous Q8H  . metoprolol tartrate  5 mg Intravenous Q8H  . multivitamin  15 mL Oral Daily  . mupirocin ointment  1 application Nasal BID  . omega-3 acid ethyl esters  1 g Oral Daily  . pantoprazole (PROTONIX) IV  40 mg Intravenous Daily  . senna  2 tablet Oral QHS  . sodium fluoride   dental Daily  . vitamin C  3,000 mg Oral Daily   Continuous Infusions: . levofloxacin (LEVAQUIN) IV Stopped (05/15/19 1904)     LOS: 2 days    Time spent: 35 mins    Irine Seal, MD [facility]  If 7PM-7AM, please contact night-coverage [email or web address] 05/16/2019, 10:58 AM

## 2019-05-17 LAB — BASIC METABOLIC PANEL
Anion gap: 13 (ref 5–15)
Anion gap: 12 (ref 5–15)
BUN: 22 mg/dL — ABNORMAL HIGH (ref 6–20)
BUN: 26 mg/dL — ABNORMAL HIGH (ref 6–20)
CO2: 41 mmol/L — ABNORMAL HIGH (ref 22–32)
CO2: 39 mmol/L — ABNORMAL HIGH (ref 22–32)
Calcium: 9.8 mg/dL (ref 8.9–10.3)
Calcium: 10 mg/dL (ref 8.9–10.3)
Chloride: 91 mmol/L — ABNORMAL LOW (ref 98–111)
Chloride: 94 mmol/L — ABNORMAL LOW (ref 98–111)
Creatinine, Ser: 0.93 mg/dL (ref 0.44–1.00)
Creatinine, Ser: 0.9 mg/dL (ref 0.44–1.00)
GFR calc Af Amer: >60 mL/min (ref >60)
GFR calc Af Amer: >60 mL/min (ref >60)
GFR calc non Af Amer: >60 mL/min (ref >60)
GFR calc non Af Amer: >60 mL/min (ref >60)
Glucose, Bld: 144 mg/dL — ABNORMAL HIGH (ref 70–99)
Glucose, Bld: 239 mg/dL — ABNORMAL HIGH (ref 70–99)
Potassium: 2.7 mmol/L — CL (ref 3.5–5.1)
Potassium: 3 mmol/L — ABNORMAL LOW (ref 3.5–5.1)
Sodium: 145 mmol/L (ref 135–145)
Sodium: 145 mmol/L (ref 135–145)

## 2019-05-17 LAB — GLUCOSE, CAPILLARY
Glucose-Capillary: 183 mg/dL — ABNORMAL HIGH (ref 70–99)
Glucose-Capillary: 289 mg/dL — ABNORMAL HIGH (ref 70–99)
Glucose-Capillary: 191 mg/dL — ABNORMAL HIGH (ref 70–99)
Glucose-Capillary: 146 mg/dL — ABNORMAL HIGH (ref 70–99)

## 2019-05-17 LAB — CBC WITH DIFFERENTIAL/PLATELET
Abs Immature Granulocytes: 0.06 10*3/uL (ref 0.00–0.07)
Basophils Absolute: 0 10*3/uL (ref 0.0–0.1)
Basophils Relative: 0 %
Eosinophils Absolute: 0 10*3/uL (ref 0.0–0.5)
Eosinophils Relative: 0 %
HCT: 31.1 % — ABNORMAL LOW (ref 36.0–46.0)
Hemoglobin: 8.8 g/dL — ABNORMAL LOW (ref 12.0–15.0)
Immature Granulocytes: 1 %
Lymphocytes Relative: 15 %
Lymphs Abs: 0.7 10*3/uL (ref 0.7–4.0)
MCH: 29.4 pg (ref 26.0–34.0)
MCHC: 28.3 g/dL — ABNORMAL LOW (ref 30.0–36.0)
MCV: 104 fL — ABNORMAL HIGH (ref 80.0–100.0)
Monocytes Absolute: 0.4 10*3/uL (ref 0.1–1.0)
Monocytes Relative: 9 %
Neutro Abs: 3.6 10*3/uL (ref 1.7–7.7)
Neutrophils Relative %: 75 %
Platelets: 189 10*3/uL (ref 150–400)
RBC: 2.99 MIL/uL — ABNORMAL LOW (ref 3.87–5.11)
RDW: 16.9 % — ABNORMAL HIGH (ref 11.5–15.5)
WBC: 4.8 10*3/uL (ref 4.0–10.5)
nRBC: 0 % (ref 0.0–0.2)

## 2019-05-17 LAB — MAGNESIUM: Magnesium: 2.1 mg/dL (ref 1.7–2.4)

## 2019-05-17 MED ORDER — POTASSIUM CHLORIDE 10 MEQ/100ML IV SOLN
10.0000 meq | INTRAVENOUS | Status: AC
  Administered 2019-05-17 (×4): 10 meq via INTRAVENOUS
  Filled 2019-05-17 (×3): qty 100

## 2019-05-17 MED ORDER — POTASSIUM CHLORIDE CRYS ER 20 MEQ PO TBCR
40.0000 meq | EXTENDED_RELEASE_TABLET | Freq: Once | ORAL | Status: AC
  Administered 2019-05-17: 40 meq via ORAL
  Filled 2019-05-17: qty 2

## 2019-05-17 MED ORDER — PANTOPRAZOLE SODIUM 40 MG PO TBEC
40.0000 mg | DELAYED_RELEASE_TABLET | Freq: Every day | ORAL | Status: DC
  Administered 2019-05-18 – 2019-05-23 (×6): 40 mg via ORAL
  Filled 2019-05-17 (×6): qty 1

## 2019-05-17 MED ORDER — IPRATROPIUM-ALBUTEROL 0.5-2.5 (3) MG/3ML IN SOLN: 3.0000 mL | RESPIRATORY_TRACT | Status: DC | PRN

## 2019-05-17 MED ORDER — ENOXAPARIN SODIUM 60 MG/0.6ML ~~LOC~~ SOLN
60.0000 mg | Freq: Every day | SUBCUTANEOUS | Status: DC
  Administered 2019-05-18 – 2019-05-23 (×6): 60 mg via SUBCUTANEOUS
  Filled 2019-05-17 (×6): qty 0.6

## 2019-05-17 MED ORDER — POTASSIUM CHLORIDE CRYS ER 20 MEQ PO TBCR: 40.0000 meq | EXTENDED_RELEASE_TABLET | ORAL | Status: DC

## 2019-05-17 MED ORDER — INSULIN ASPART 100 UNIT/ML ~~LOC~~ SOLN
0.0000 [IU] | Freq: Three times a day (TID) | SUBCUTANEOUS | Status: DC
  Administered 2019-05-17: 11 [IU] via SUBCUTANEOUS
  Administered 2019-05-17 – 2019-05-18 (×2): 4 [IU] via SUBCUTANEOUS
  Administered 2019-05-18: 3 [IU] via SUBCUTANEOUS
  Administered 2019-05-18: 4 [IU] via SUBCUTANEOUS
  Administered 2019-05-19 (×2): 7 [IU] via SUBCUTANEOUS
  Administered 2019-05-19 – 2019-05-21 (×5): 4 [IU] via SUBCUTANEOUS
  Administered 2019-05-21 – 2019-05-22 (×3): 7 [IU] via SUBCUTANEOUS

## 2019-05-17 NOTE — Progress Notes (Signed)
PROGRESS NOTE    Kathleen Roy  NLZ:[id number] DOB: 10-Aug-1961 DOA: 05/14/2019 PCP: [name], MD    Brief Narrative:  Per Dr [name] is a 58 y.o. female with medical history significant of COPD, chronic respiratory failure on 4 L nasal cannula oxygen, HTN, HLD, diet-controled DM, dCHF, morbid obesity, OSA, hypoventilation syndrome, migraine headache, fibromyalgia, memory loss, who presents with SOB and oxygen desaturation.  Per report, pt was found to have oxygen desaturation to low 80s on baseline 4 L nasal cannula oxygen in the facility. Pt is placed on NRB at 15 L O2- 95%. She has hx of memory loss. She is mildly confused. She response to calling her name, but is not oriented to place and time.  She does not complain of any pain.  Does not seem to have chest pain or abdominal pain.  No active cough, nausea, vomiting, diarrhea noted. Not sure if pt has symptoms of UTI.  She moves all extremities on painful stimuli.  No facial droop.  ED Course: pt was found to have BNP 36.9, negative COVID-19 test, pending WBC, negative trop, renal function normal, temperature normal, heart rate in 90s, RR 25, oxygen 95% on BiPAP. VBG with pH 7.301, CO2 118, O2 49.1.  Patient is admitted to stepdown bed as inpatient.  # CXR showed:  Cardiomegaly. Diffuse lung density, similar to previous studies but possibly slightly more pronounced. This could represent mild edema superimposed upon chronic lung disease  Assessment & Plan:   Principal Problem:   Acute on chronic respiratory failure with hypoxia and hypercapnia (HCC) Active Problems:   Major depressive disorder, recurrent episode (HCC)   COPD (chronic obstructive pulmonary disease) (HCC)   Hypokalemia   OSA (obstructive sleep apnea)   Obesity hypoventilation syndrome (HCC)   Chronic diastolic congestive heart failure (HCC)   Essential hypertension   Diabetes mellitus without complication (HCC)   Macrocytic anemia   Acute metabolic  encephalopathy   Acute on chronic diastolic congestive heart failure (HCC)   Hypernatremia   1 acute on chronic respiratory failure with hypoxia and hypercapnia/COVID-19 ruled out Likely multifactorial secondary to probable acute COPD exacerbation, +/-CHF exacerbation in the setting of OSA and hypoventilation syndrome.  Concern for COVID-19 which has been ruled out.  Patient is morbidly obese, patient with decreased air movement, difficult to assess volume status.  Chest x-ray showing possible mild pulmonary edema.  Patient with increased O2 requirements was 80% on 4 L home O2 on presentation, requiring nonrebreather and now on BiPAP.  Patient in the stepdown unit.  Patient somewhat lethargic on examination.  SARS-CoV-2 was negative on admission however due to patient's increasing O2 requirements was on BiPAP concern for COVID.  Repeat SARS-CoV-2 test is negative.  Respiratory viral panel negative.  Airborne precautions have been discontinued.  T chest which was done showed thyromegaly, small bilateral effusions, scattered GGO, bilateral lower lobe airspace disease with air bronchograms.  Continue IV Levaquin, IV steroid taper, scheduled nebs.  Discontinued IV Lasix due to worsening sodium levels and contraction alkalosis.  PCCM following and appreciate input and recommendations..   2.  Probable acute COPD exacerbation Patient with poor air movement.  Patient with acute on chronic respiratory failure was on BiPAP and now on 13 L high flow O2.  ABG somewhat improving with a pH of 7.510 this morning from 7.462 from 7.301.  PCO2 decreasing and currently at 60.8.  PO2 of 60.1.  Continue IV Levaquin, IV steroid taper, Flonase, IV PPI,  nebulizer treatments.  PCCM following and appreciate input and recommendations..    3.  Probable acute on chronic diastolic CHF Patient presented with problem #1.  2D echo on 08/20/2018 with a EF of 60 to 65% with grade 2 diastolic dysfunction.  Patient on admission noted to have  some trace lower extremity edema.  Chest x-ray with concern for possible mild pulmonary edema.  Patient was on torsemide twice daily.  Patient currently on Lasix 80 mg IV every 12 hours.  Patient with a worsening hypernatremia and a contraction alkalosis and as such IV diuretics have been held.  Monitor urine output.  Hopefully patient with auto- diuresis..  Patient looks clinically dehydrated on examination.  2D echo with EF greater than 65%, mildly increased left ventricular wall thickness, indeterminate diastolic filling due to EA fusion, poor acoustic windows limited study even with Definity, grossly normal mitral valve and aortic valve and aortic root and ascending aorta.  Strict I's and O's.  Daily weights.    4.  Major depressive disorder recurrent Patient on oral Effexor.  Patient was lethargic and as such Effexor has been held.  Patient more alert today.  Effexor has been resumed.   5.  Acute metabolic encephalopathy and history of memory loss Likely secondary to problem #1.  Patient with no signs of source of infection noted.  See #1.  ABG with decreasing PCO2.  pH has increased to 7.510.  Patient alert and improved clinically.  Patient answering questions more appropriately.  Continue treatment for probable acute COPD exacerbation with nebulizer treatments, IV steroid taper, IV Levaquin.  Follow.    6.  Obstructive sleep apnea/obesity hypoventilation syndrome On BiPAP nightly and PRN per pulmonary recommendations.   7.  Hypertension Discontinued Toprol-XL and placed on IV Lopressor 5 mg every 8 hours.  If continued improvement will transition back to oral Toprol-XL tomorrow.  8.  Well-controlled diabetes mellitus type 2 Last hemoglobin A1c was 4.9 on 09/20/2017.  Hemoglobin A1c 5.5.  CBG of 183 this morning.  Patient on IV steroids.  Will place on sliding scale insulin.  Outpatient follow-up.   9.  Microcytic anemia H&H stable.  Follow.  10 hypokalemia Potassium at 2.7.  K. Dur 40 mEq  p.o. every 4 hours x1 doses.  Patient already receiving KCl 10 mEq runs x4 rounds.  Magnesium at 2.1.  11.  Hypernatremia with metabolic alkalosis Likely contraction alkalosis secondary to diuresis.  Improved with discontinuation of diuretics.  Follow.     DVT prophylaxis: Lovenox Code Status: DNR Family Communication: No family at bedside. Disposition Plan: Remain in stepdown.  Patient requiring 13 L high flow O2.     Consultants:   PCCM: Dr. Ander Slade 05/15/2019  Procedures:   CT angiogram chest 05/15/2019  Chest x-ray 05/14/2019  2D echo 05/16/2019    Antimicrobials:   IV Levaquin 05/15/2019   Subjective: Patient off BiPAP and on 13 L High flow oxygen.  More alert today.  Answering questions appropriately.  Following commands.  Patient states she is wheelchair-bound at home.  Objective: Vitals:   05/17/19 0500 05/17/19 0600 05/17/19 0800 05/17/19 0804  BP: (!) 132/55 119/85    Pulse: 94 79    Resp: 17 (!) 22    Temp:   98.1 F (36.7 C)   TempSrc:   Oral   SpO2: 94% 94%  93%  Weight: (!) 142.9 kg     Height:        Intake/Output Summary (Last 24 hours) at 05/17/2019 1030 Last  data filed at 05/17/2019 0600 Gross per 24 hour  Intake 165.98 ml  Output 800 ml  Net -634.02 ml   Filed Weights   05/15/19 0500 05/16/19 0500 05/17/19 0500  Weight: (!) 146.8 kg (!) 142.9 kg (!) 142.9 kg    Examination:  General exam: Patient on 13 L high flow nasal cannula.  Alert.  Poor dentition.  Dry mucous membranes. Respiratory system: Diminished breath sounds in the bases.  Some scattered coarse breath sounds.  On 13 L high flow nasal cannula.  Speaking in full sentences.  Cardiovascular system: Distant heart sounds secondary to body habitus.  Regular rate rhythm no murmurs rubs or gallops.  No JVD.  No pitting edema.  Gastrointestinal system: Abdomen is obese, soft, nondistended, nontender, positive bowel sounds.  Central nervous system: Off BiPAP and on 13 L high flow nasal  cannula. Moving extremities spontaneously.  Lethargic.  Extremities: Symmetric 5 x 5 power. Skin: No rashes, lesions or ulcers Psychiatry: Judgement and insight appear poor-fair. Mood & affect appropriate.     Data Reviewed: I have personally reviewed following labs and imaging studies  CBC: Recent Labs  Lab 05/14/19 1802 05/15/19 1001 05/16/19 0512 05/17/19 0316  WBC 6.4 6.1 5.7 4.8  NEUTROABS 4.7 4.6 4.9 3.6  HGB 9.3* 8.9* 9.1* 8.8*  HCT 34.7* 32.2* 31.4* 31.1*  MCV 107.8* 105.2* 102.6* 104.0*  PLT 177 170 213 008   Basic Metabolic Panel: Recent Labs  Lab 05/14/19 1802 05/15/19 1001 05/16/19 0221 05/17/19 0316  NA 148* 147* 149* 145  K 3.9 3.0* 4.0 2.7*  CL 86* 85* 90* 91*  CO2 >50* 49* 46* 41*  GLUCOSE 137* 141* 154* 144*  BUN 13 15 16  22*  CREATININE 0.68 0.74 0.89 0.93  CALCIUM 9.3 9.1 9.7 9.8  MG  --  2.0  --  2.1   GFR: Estimated Creatinine Clearance: 93 mL/min (by C-G formula based on SCr of 0.93 mg/dL). Liver Function Tests: Recent Labs  Lab 05/14/19 1802  AST 17  ALT 16  ALKPHOS 87  BILITOT 0.5  PROT 7.3  ALBUMIN 3.4*   No results for input(s): LIPASE, AMYLASE in the last 168 hours. No results for input(s): AMMONIA in the last 168 hours. Coagulation Profile: No results for input(s): INR, PROTIME in the last 168 hours. Cardiac Enzymes: Recent Labs  Lab 05/14/19 1802 05/16/19 0221  TROPONINI <0.03 <0.03   BNP (last 3 results) No results for input(s): PROBNP in the last 8760 hours. HbA1C: Recent Labs    05/16/19 0221  HGBA1C 5.5   CBG: Recent Labs  Lab 05/15/19 0830 05/16/19 0755 05/17/19 0741  GLUCAP 148* 162* 183*   Lipid Profile: No results for input(s): CHOL, HDL, LDLCALC, TRIG, CHOLHDL, LDLDIRECT in the last 72 hours. Thyroid Function Tests: No results for input(s): TSH, T4TOTAL, FREET4, T3FREE, THYROIDAB in the last 72 hours. Anemia Panel: Recent Labs    05/15/19 1001  FERRITIN 153   Sepsis Labs: Recent Labs  Lab  05/14/19 1802 05/15/19 0253  LATICACIDVEN 0.8 1.2    Recent Results (from the past 240 hour(s))  SARS Coronavirus 2 (CEPHEID- Performed in [facility] hospital lab), Hosp Order     Status: None   Collection Time: 05/14/19  6:22 PM  Result Value Ref Range Status   SARS Coronavirus 2 NEGATIVE NEGATIVE Final    Comment: (NOTE) If result is NEGATIVE SARS-CoV-2 target nucleic acids are NOT DETECTED. The SARS-CoV-2 RNA is generally detectable in upper and lower  respiratory specimens  during the acute phase of infection. The lowest  concentration of SARS-CoV-2 viral copies this assay can detect is 250  copies / mL. A negative result does not preclude SARS-CoV-2 infection  and should not be used as the sole basis for treatment or other  patient management decisions.  A negative result may occur with  improper specimen collection / handling, submission of specimen other  than nasopharyngeal swab, presence of viral mutation(s) within the  areas targeted by this assay, and inadequate number of viral copies  (<250 copies / mL). A negative result must be combined with clinical  observations, patient history, and epidemiological information. If result is POSITIVE SARS-CoV-2 target nucleic acids are DETECTED. The SARS-CoV-2 RNA is generally detectable in upper and lower  respiratory specimens dur ing the acute phase of infection.  Positive  results are indicative of active infection with SARS-CoV-2.  Clinical  correlation with patient history and other diagnostic information is  necessary to determine patient infection status.  Positive results do  not rule out bacterial infection or co-infection with other viruses. If result is PRESUMPTIVE POSTIVE SARS-CoV-2 nucleic acids MAY BE PRESENT.   A presumptive positive result was obtained on the submitted specimen  and confirmed on repeat testing.  While 2019 novel coronavirus  (SARS-CoV-2) nucleic acids may be present in the submitted sample    additional confirmatory testing may be necessary for epidemiological  and / or clinical management purposes  to differentiate between  SARS-CoV-2 and other Sarbecovirus currently known to infect humans.  If clinically indicated additional testing with an alternate test  methodology [phone]) is advised. The SARS-CoV-2 RNA is generally  detectable in upper and lower respiratory sp ecimens during the acute  phase of infection. The expected result is Negative. Fact Sheet for Patients:  StrictlyIdeas.no Fact Sheet for Healthcare Providers: BankingDealers.co.za This test is not yet approved or cleared by the [place] FDA and has been authorized for detection and/or diagnosis of SARS-CoV-2 by FDA under an Emergency Use Authorization (EUA).  This EUA will remain in effect (meaning this test can be used) for the duration of the COVID-19 declaration under Section 564(b)(1) of the Act, 21 U.S.C. section 360bbb-3(b)(1), unless the authorization is terminated or revoked sooner. Performed at [facility], [place] [address], [place], [place] [address]   MRSA PCR Screening     Status: Abnormal   Collection Time: 05/14/19 11:54 PM  Result Value Ref Range Status   MRSA by PCR POSITIVE (A) NEGATIVE Final    Comment:        The GeneXpert MRSA Assay (FDA approved for NASAL specimens only), is one component of a comprehensive MRSA colonization surveillance program. It is not intended to diagnose MRSA infection nor to guide or monitor treatment for MRSA infections. RESULT CALLED TO, READ BACK BY AND VERIFIED WITH: LANELY,S RN @0303  ON 05/15/2019 JACKSON,K Performed at [facility], [place] [address], [place], [place] [address]   Respiratory Panel by PCR     Status: None   Collection Time: 05/15/19  2:35 AM  Result Value Ref Range Status   Adenovirus NOT DETECTED NOT DETECTED Final   Coronavirus 229E NOT DETECTED  NOT DETECTED Final    Comment: (NOTE) The Coronavirus on the Respiratory Panel, DOES NOT test for the novel  Coronavirus (2019 nCoV)    Coronavirus HKU1 NOT DETECTED NOT DETECTED Final   Coronavirus NL63 NOT DETECTED NOT DETECTED Final   Coronavirus OC43 NOT DETECTED NOT DETECTED Final  Metapneumovirus NOT DETECTED NOT DETECTED Final   Rhinovirus / Enterovirus NOT DETECTED NOT DETECTED Final   Influenza A NOT DETECTED NOT DETECTED Final   Influenza B NOT DETECTED NOT DETECTED Final   Parainfluenza Virus 1 NOT DETECTED NOT DETECTED Final   Parainfluenza Virus 2 NOT DETECTED NOT DETECTED Final   Parainfluenza Virus 3 NOT DETECTED NOT DETECTED Final   Parainfluenza Virus 4 NOT DETECTED NOT DETECTED Final   Respiratory Syncytial Virus NOT DETECTED NOT DETECTED Final   Bordetella pertussis NOT DETECTED NOT DETECTED Final   Chlamydophila pneumoniae NOT DETECTED NOT DETECTED Final   Mycoplasma pneumoniae NOT DETECTED NOT DETECTED Final    Comment: Performed at [facility], [place] [address], [place], [place] [address]  SARS Coronavirus 2 [facility] order, Performed in [facility] hospital lab)     Status: None   Collection Time: 05/15/19  9:03 AM  Result Value Ref Range Status   SARS Coronavirus 2 NEGATIVE NEGATIVE Final    Comment: (NOTE) If result is NEGATIVE SARS-CoV-2 target nucleic acids are NOT DETECTED. The SARS-CoV-2 RNA is generally detectable in upper and lower  respiratory specimens during the acute phase of infection. The lowest  concentration of SARS-CoV-2 viral copies this assay can detect is 250  copies / mL. A negative result does not preclude SARS-CoV-2 infection  and should not be used as the sole basis for treatment or other  patient management decisions.  A negative result may occur with  improper specimen collection / handling, submission of specimen other  than nasopharyngeal swab, presence of viral mutation(s) within the  areas targeted by this assay, and  inadequate number of viral copies  (<250 copies / mL). A negative result must be combined with clinical  observations, patient history, and epidemiological information. If result is POSITIVE SARS-CoV-2 target nucleic acids are DETECTED. The SARS-CoV-2 RNA is generally detectable in upper and lower  respiratory specimens dur ing the acute phase of infection.  Positive  results are indicative of active infection with SARS-CoV-2.  Clinical  correlation with patient history and other diagnostic information is  necessary to determine patient infection status.  Positive results do  not rule out bacterial infection or co-infection with other viruses. If result is PRESUMPTIVE POSTIVE SARS-CoV-2 nucleic acids MAY BE PRESENT.   A presumptive positive result was obtained on the submitted specimen  and confirmed on repeat testing.  While 2019 novel coronavirus  (SARS-CoV-2) nucleic acids may be present in the submitted sample  additional confirmatory testing may be necessary for epidemiological  and / or clinical management purposes  to differentiate between  SARS-CoV-2 and other Sarbecovirus currently known to infect humans.  If clinically indicated additional testing with an alternate test  methodology [phone]) is advised. The SARS-CoV-2 RNA is generally  detectable in upper and lower respiratory sp ecimens during the acute  phase of infection. The expected result is Negative. Fact Sheet for Patients:  StrictlyIdeas.no Fact Sheet for Healthcare Providers: BankingDealers.co.za This test is not yet approved or cleared by the [place] FDA and has been authorized for detection and/or diagnosis of SARS-CoV-2 by FDA under an Emergency Use Authorization (EUA).  This EUA will remain in effect (meaning this test can be used) for the duration of the COVID-19 declaration under Section 564(b)(1) of the Act, 21 U.S.C. section 360bbb-3(b)(1), unless the  authorization is terminated or revoked sooner. Performed at [facility], [place] [address], [place], [place] [address]  Radiology Studies: Ct Angio Chest Pe W Or Wo Contrast  Result Date: 05/15/2019 CLINICAL DATA:  Patient admitted 05/14/2019 for shortness of breath and decreased oxygen saturation. History of COPD. EXAM: CT ANGIOGRAPHY CHEST WITH CONTRAST TECHNIQUE: Multidetector CT imaging of the chest was performed using the standard protocol during bolus administration of intravenous contrast. Multiplanar CT image reconstructions and MIPs were obtained to evaluate the vascular anatomy. CONTRAST:  100 mL OMNIPAQUE IOHEXOL 350 MG/ML SOLN COMPARISON:  Single-view of the chest 05/14/2019, 01/10/2019 and 11/26/2016. FINDINGS: Cardiovascular: The study is somewhat limited by bolus timing but no pulmonary embolus is identified. There is cardiomegaly. No pericardial effusion. No aortic aneurysm. Mediastinum/Nodes: No lymphadenopathy. Thyromegaly is most notable in the left lobe. The esophagus is unremarkable. Lungs/Pleura: Small bilateral pleural effusions greater on the left. Dependent bilateral lower lobe airspace disease with air bronchograms is seen. Scattered ground-glass attenuation and mild interlobular septal thickening are identified. Upper Abdomen: There is fatty infiltration of the liver and hepatomegaly. The patient is status post cholecystectomy. Musculoskeletal: No acute or focal bony abnormality. Review of the MIP images confirms the above findings. IMPRESSION: Negative for pulmonary embolus. Dependent bilateral lower lobe airspace disease could be due to pneumonia, atelectasis or aspiration. Cardiomegaly and findings compatible with mild interstitial pulmonary edema. Small pleural effusions noted. Hepatomegaly and fatty infiltration of the liver. Electronically Signed   By: Inge Rise M.D.   On: 05/15/2019 15:03        Scheduled Meds:  Chlorhexidine  Gluconate Cloth  6 each Topical Q0600   cholecalciferol  1,000 Units Oral Daily   enoxaparin (LOVENOX) injection  40 mg Subcutaneous Daily   fluticasone  1 spray Each Nare Daily   fluticasone furoate-vilanterol  1 puff Inhalation Daily   lamoTRIgine  25 mg Oral Daily   loratadine  10 mg Oral Daily   mouth rinse  15 mL Mouth Rinse q12n4p   methylPREDNISolone (SOLU-MEDROL) injection  40 mg Intravenous Q8H   metoprolol tartrate  5 mg Intravenous Q8H   multivitamin  15 mL Oral Daily   mupirocin ointment  1 application Nasal BID   omega-3 acid ethyl esters  1 g Oral Daily   pantoprazole (PROTONIX) IV  40 mg Intravenous Daily   potassium chloride  40 mEq Oral Once   senna  2 tablet Oral QHS   venlafaxine XR  300 mg Oral Q breakfast   And   venlafaxine XR  37.5 mg Oral Q breakfast   vitamin C  3,000 mg Oral Daily   Continuous Infusions:  levofloxacin (LEVAQUIN) IV Stopped (05/16/19 1901)     LOS: 3 days    Time spent: 35 mins    Irine Seal, MD [facility]  If 7PM-7AM, please contact night-coverage [email or web address] 05/17/2019, 10:30 AM

## 2019-05-17 NOTE — TOC Initial Note (Signed)
Transition of Care [facility]) - Initial/Assessment Note    Patient Details  Name: Kathleen Roy MRN: [id number] Date of Birth: Dec 03, 1961  Transition of Care [facility]) CM/SW Contact:    Nila Nephew, [name] Phone Number: [phone] 05/17/2019, 11:56 AM  Clinical Narrative:  Pt admitted with respiratory failure/Probable acute COPD exacerbation/Probable acute on chronic diastolic CHF. Is resident of [facility]. Placed call to [facility] staff to update and gain information re: pt's care baseline. Also placed call to emergency contact (listed as sibling) but number is not working [phone]. Anticipate return to [facility] if clinically appropriate at DC. TOC team will follow. (Will need FL2 and more communication with [name] re: return)              Expected Discharge Plan: [place] Barriers to Discharge: Continued Medical Work up   Patient Goals and CMS Choice Patient states their goals for this hospitalization and ongoing recovery are:: did not particpate CMS Medicare.gov Compare Post Acute Care list provided to:: (na) Choice offered to / list presented to : NA  Expected Discharge Plan and Services Expected Discharge Plan: [place] Choice: [place] arrangements for the past 2 months: [place]                                      Prior Living Arrangements/Services Living arrangements for the past 2 months: [place] Lives with:: Facility Resident Patient language and need for interpreter reviewed:: No Do you feel safe going back to the place where you live?: Yes      Need for Family Participation in Patient Care: No (Comment) Care giver support system in place?: Yes (comment)(SNF resident )   Criminal Activity/Legal Involvement Pertinent to Current Situation/Hospitalization: No - Comment as needed  Activities of Daily Living Home Assistive  Devices/Equipment: [employer] fast streamer ADL Screening (condition at time of admission) Patient's cognitive ability adequate to safely complete daily activities?: No Is the patient deaf or have difficulty hearing?: No Does the patient have difficulty seeing, even when wearing glasses/contacts?: No Does the patient have difficulty concentrating, remembering, or making decisions?: Yes Patient able to express need for assistance with ADLs?: No Does the patient have difficulty dressing or bathing?: Yes Independently performs ADLs?: No Communication: Dependent Is this a change from baseline?: Pre-admission baseline Dressing (OT): Dependent Is this a change from baseline?: Pre-admission baseline Grooming: Dependent Is this a change from baseline?: Pre-admission baseline Feeding: Dependent Is this a change from baseline?: Pre-admission baseline Bathing: Dependent Is this a change from baseline?: Pre-admission baseline Toileting: Dependent Is this a change from baseline?: Pre-admission baseline In/Out Bed: Dependent Is this a change from baseline?: Pre-admission baseline Walks in Home: Dependent Is this a change from baseline?: Pre-admission baseline Does the patient have difficulty walking or climbing stairs?: Yes Weakness of Legs: Both Weakness of Arms/Hands: Both  Permission Sought/Granted Permission sought to share information with : Investment banker, corporate granted to share info w AGENCY: [name]        Emotional Assessment Appearance:: Appears stated age     Orientation: : Oriented to Self(per charting) Alcohol / Substance Use: Not Applicable Psych Involvement: No (comment)  Admission diagnosis:  Acute respiratory failure with hypoxia and hypercapnia ([place]) [J96.01, J96.02] Patient Active Problem List   Diagnosis  Date Noted  . Acute on chronic diastolic congestive heart failure ([place])   . Hypernatremia   . Acute on chronic respiratory failure with  hypoxia and hypercapnia ([place]) 05/14/2019  . Diabetes mellitus without complication ([place]) 54/08/8118  . Macrocytic anemia 05/14/2019  . Acute metabolic encephalopathy 14/78/2956  . Candidal dermatitis 09/25/2017  . Requires daily assistance for activities of daily living (ADL) and comfort needs   . Weakness 09/24/2017  . Other fatigue   . Altered mental status 09/20/2017  . 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 Allergic rhinitis 08/15/2017  . Immobility 08/15/2017  . Fibromyalgia 06/16/2017  . Chronic pain of both knees   . Acute respiratory failure with hypercapnia ([place]) 10/15/2016  . Type 2 diabetes mellitus ([place]) 02/14/2016  . Chronic diastolic congestive heart failure ([place])   . Essential hypertension   . OSA (obstructive sleep apnea)   . Obesity hypoventilation syndrome ([place])   . Chronic respiratory failure with hypoxia ([place])   . Hyperglycemia 11/08/2014  . Memory loss 03/18/2014  . Hypokalemia 08/19/2013  . Venous stasis dermatitis of both lower extremities 12/02/2012  . COPD (chronic obstructive pulmonary disease) ([place]) 10/15/2011  . Peripheral neuropathy ([place]) 10/15/2011  . BACK PAIN, LUMBAR, CHRONIC 01/22/2011  . Morbid obesity with BMI of 70 and over, adult ([place]) 11/16/2007  . Major depressive disorder, recurrent episode ([place]) 02/12/2007   PCP:  [name], MD Pharmacy:   [facility] [phone] - [address] [place] [address] Phone: [phone] Fax: [phone]  [facility] [phone] - [place], [place] - [address] [place] [facility] [address] Phone: [phone] Fax: [phone]     Social Determinants of Health (SDOH) Interventions    Readmission Risk Interventions No flowsheet data found.

## 2019-05-17 NOTE — Progress Notes (Addendum)
CRITICAL VALUE ALERT  Critical Value:  Potassium 2.7  Date & Time Notied:  05/17/2019 05:00  Provider Notified: Kennon Holter, NP  Orders Received/Actions taken: 4 runs of K.

## 2019-05-17 NOTE — Progress Notes (Signed)
NAME:  Kathleen Roy, MRN:  [id number], DOB:  1961/10/21, LOS: 3 ADMISSION DATE:  05/14/2019, CONSULTATION DATE:  05/15/2019 REFERRING MD:  Dr. Grandville Silos, [place], CHIEF COMPLAINT:  Short of breath   Brief History   58 yo female from [place] assisted living presented with dyspnea and worsening hypoxia felt to be from COPD exacerbation in setting of OSA/OHS.  PCCM asked to assist with management of respiratory failure needing Bipap.  Past Medical History  COPD, chronic respiratory failure on 4 liters, HTN, HLD, DM, diastolic CHF, OSA/OHS, migraine HA, fibromyalgia, memory loss  Significant Hospital Events   5/29 Admit  Consults:    Procedures:    Significant Diagnostic Tests:  CT angio chest 5/30 >> thyromegaly, small b/l effusions, scattered GGO, b/l lower lobe ASD with air bronchograms (reviewed by me) Echo 5/31 >> EF greater than 65%  Micro Data:  COVID 5/29 >> negative COVID 5/30 >> negative RVP 5/30 >> negative  Antimicrobials:  levaquin 5/30 >>  Interim history/subjective:  Had trouble sleeping last night.  C/o diffuse body aches.  Objective   Blood pressure 119/85, pulse 79, temperature 98.1 F (36.7 C), temperature source Oral, resp. rate (!) 22, height 5\' 2"  (1.575 m), weight (!) 142.9 kg, SpO2 93 %.    FiO2 (%):  [70 %] 70 %   Intake/Output Summary (Last 24 hours) at 05/17/2019 0817 Last data filed at 05/17/2019 0600 Gross per 24 hour  Intake 165.98 ml  Output 800 ml  Net -634.02 ml   Filed Weights   05/15/19 0500 05/16/19 0500 05/17/19 0500  Weight: (!) 146.8 kg (!) 142.9 kg (!) 142.9 kg    Examination:  General - alert Eyes - pupils reactive ENT - no sinus tenderness, no stridor, poor dentition Cardiac - regular rate/rhythm, no murmur Chest - equal breath sounds b/l, no wheezing or rales Abdomen - soft, non tender, + bowel sounds Extremities - 1+ edema Skin - no rashes Neuro - follows commands appropriately   Resolved Hospital Problem list      Assessment & Plan:   Acute on chronic hypoxic/hypercapnic respiratory from COPD exacerbation and pneumonia. Hx of OSA/OHS. Plan - day 3 of ABx - goal SpO2 88 to 95% - Bipap qhs and prn - continue Breo  - change duoneb to prn - wean solumedrol as tolerated  Acute on chronic diastolic CHF. Plan - monitor blood pressure  Metabolic alkalosis with hypernatremia >> likely contraction alkalosis. Plan - hold diuresis for now - f/u BMET  Hx of depression, memory loss, fibromyalgia. Plan - per primary team  Best practice:  Diet: soft diet DVT prophylaxis: Lovenox GI prophylaxis: protonix Mobility: as tolerated Code Status: DNR Disposition: PCCM will sign off.  Please call if additional help needed while she is in hospital.  Labs    CMP Latest Ref Rng & Units 05/17/2019 05/16/2019 05/15/2019  Glucose 70 - 99 mg/dL 144(H) 154(H) 141(H)  BUN 6 - 20 mg/dL 22(H) 16 15  Creatinine 0.44 - 1.00 mg/dL 0.93 0.89 0.74  Sodium 135 - 145 mmol/L 145 149(H) 147(H)  Potassium 3.5 - 5.1 mmol/L 2.7(LL) 4.0 3.0(L)  Chloride 98 - 111 mmol/L 91(L) 90(L) 85(L)  CO2 22 - 32 mmol/L 41(H) 46(H) 49(H)  Calcium 8.9 - 10.3 mg/dL 9.8 9.7 9.1  Total Protein 6.5 - 8.1 g/dL - - -  Total Bilirubin 0.3 - 1.2 mg/dL - - -  Alkaline Phos 38 - 126 U/L - - -  AST 15 - 41 U/L - - -  ALT 0 - 44 U/L - - -   CBC Latest Ref Rng & Units 05/17/2019 05/16/2019 05/15/2019  WBC 4.0 - 10.5 K/uL 4.8 5.7 6.1  Hemoglobin 12.0 - 15.0 g/dL 8.8(L) 9.1(L) 8.9(L)  Hematocrit 36.0 - 46.0 % 31.1(L) 31.4(L) 32.2(L)  Platelets 150 - 400 K/uL 189 213 170   CBG (last 3)  Recent Labs    05/15/19 0830 05/16/19 0755 05/17/19 0741  GLUCAP 148* 162* 183*   ABG    Component Value Date/Time   PHART 7.510 (H) 05/16/2019 0854   PCO2ART 60.8 (H) 05/16/2019 0854   PO2ART 60.1 (L) 05/16/2019 0854   HCO3 48.1 (H) 05/16/2019 0854   TCO2 44 (H) 11/11/2017 0101   O2SAT 94.0 05/16/2019 [place], MD [place] [facility]  [facility] 05/17/2019, 8:17 AM

## 2019-05-17 NOTE — Progress Notes (Signed)
Pharmacy interventions: LMWH 40 increased to 60 mg for BMI 57.6 IV PPI changed to PO Consider change IV levaquin to PO levaquin when able Thanks Eudelia Bunch, Pharm.D 05/17/2019 11:26 AM

## 2019-05-18 ENCOUNTER — Encounter (HOSPITAL_COMMUNITY): Payer: Self-pay

## 2019-05-18 ENCOUNTER — Other Ambulatory Visit: Payer: Self-pay

## 2019-05-18 LAB — CBC WITH DIFFERENTIAL/PLATELET
Abs Immature Granulocytes: 0.03 10*3/uL (ref 0.00–0.07)
Basophils Absolute: 0 10*3/uL (ref 0.0–0.1)
Basophils Relative: 0 %
Eosinophils Absolute: 0 10*3/uL (ref 0.0–0.5)
Eosinophils Relative: 0 %
HCT: 31.1 % — ABNORMAL LOW (ref 36.0–46.0)
Hemoglobin: 8.7 g/dL — ABNORMAL LOW (ref 12.0–15.0)
Immature Granulocytes: 1 %
Lymphocytes Relative: 15 %
Lymphs Abs: 0.7 10*3/uL (ref 0.7–4.0)
MCH: 28.8 pg (ref 26.0–34.0)
MCHC: 28 g/dL — ABNORMAL LOW (ref 30.0–36.0)
MCV: 103 fL — ABNORMAL HIGH (ref 80.0–100.0)
Monocytes Absolute: 0.4 10*3/uL (ref 0.1–1.0)
Monocytes Relative: 9 %
Neutro Abs: 3.5 10*3/uL (ref 1.7–7.7)
Neutrophils Relative %: 75 %
Platelets: 182 10*3/uL (ref 150–400)
RBC: 3.02 MIL/uL — ABNORMAL LOW (ref 3.87–5.11)
RDW: 16.6 % — ABNORMAL HIGH (ref 11.5–15.5)
WBC: 4.6 10*3/uL (ref 4.0–10.5)
nRBC: 0 % (ref 0.0–0.2)

## 2019-05-18 LAB — BASIC METABOLIC PANEL
Anion gap: 12 (ref 5–15)
BUN: 22 mg/dL — ABNORMAL HIGH (ref 6–20)
CO2: 36 mmol/L — ABNORMAL HIGH (ref 22–32)
Calcium: 9.9 mg/dL (ref 8.9–10.3)
Chloride: 95 mmol/L — ABNORMAL LOW (ref 98–111)
Creatinine, Ser: 0.8 mg/dL (ref 0.44–1.00)
GFR calc Af Amer: >60 mL/min (ref >60)
GFR calc non Af Amer: >60 mL/min (ref >60)
Glucose, Bld: 170 mg/dL — ABNORMAL HIGH (ref 70–99)
Potassium: 3.2 mmol/L — ABNORMAL LOW (ref 3.5–5.1)
Sodium: 143 mmol/L (ref 135–145)

## 2019-05-18 LAB — INTERLEUKIN-6, PLASMA: Interleukin-6, Plasma: 30.5 pg/mL — ABNORMAL HIGH (ref 0.0–12.2)

## 2019-05-18 LAB — GLUCOSE, CAPILLARY
Glucose-Capillary: 127 mg/dL — ABNORMAL HIGH (ref 70–99)
Glucose-Capillary: 177 mg/dL — ABNORMAL HIGH (ref 70–99)
Glucose-Capillary: 178 mg/dL — ABNORMAL HIGH (ref 70–99)
Glucose-Capillary: 212 mg/dL — ABNORMAL HIGH (ref 70–99)

## 2019-05-18 LAB — MAGNESIUM: Magnesium: 2 mg/dL (ref 1.7–2.4)

## 2019-05-18 MED ORDER — PRAVASTATIN SODIUM 40 MG PO TABS
40.0000 mg | ORAL_TABLET | Freq: Every day | ORAL | Status: DC
  Administered 2019-05-18 – 2019-05-22 (×4): 40 mg via ORAL
  Filled 2019-05-18 (×2): qty 2
  Filled 2019-05-18: qty 1
  Filled 2019-05-18: qty 2

## 2019-05-18 MED ORDER — POTASSIUM CHLORIDE CRYS ER 20 MEQ PO TBCR
40.0000 meq | EXTENDED_RELEASE_TABLET | ORAL | Status: AC
  Administered 2019-05-18 (×2): 40 meq via ORAL
  Filled 2019-05-18 (×2): qty 2

## 2019-05-18 MED ORDER — OXYCODONE HCL 5 MG PO TABS
5.0000 mg | ORAL_TABLET | Freq: Four times a day (QID) | ORAL | Status: DC | PRN
  Administered 2019-05-18 – 2019-05-22 (×4): 5 mg via ORAL
  Filled 2019-05-18 (×4): qty 1

## 2019-05-18 MED ORDER — METOPROLOL SUCCINATE ER 25 MG PO TB24
12.5000 mg | ORAL_TABLET | Freq: Every day | ORAL | Status: DC
  Administered 2019-05-18 – 2019-05-23 (×5): 12.5 mg via ORAL
  Filled 2019-05-18 (×6): qty 1

## 2019-05-18 MED ORDER — OXYCODONE-ACETAMINOPHEN 5-325 MG PO TABS
1.0000 | ORAL_TABLET | Freq: Four times a day (QID) | ORAL | Status: DC | PRN
  Administered 2019-05-18 – 2019-05-22 (×4): 1 via ORAL
  Filled 2019-05-18 (×4): qty 1

## 2019-05-18 MED ORDER — POTASSIUM CHLORIDE CRYS ER 20 MEQ PO TBCR
40.0000 meq | EXTENDED_RELEASE_TABLET | Freq: Every day | ORAL | Status: DC
  Administered 2019-05-18: 40 meq via ORAL
  Filled 2019-05-18: qty 2

## 2019-05-18 MED ORDER — LEVOFLOXACIN 750 MG PO TABS
750.0000 mg | ORAL_TABLET | Freq: Every day | ORAL | Status: DC
  Administered 2019-05-18 – 2019-05-19 (×2): 750 mg via ORAL
  Filled 2019-05-18 (×2): qty 1

## 2019-05-18 MED ORDER — ACETAMINOPHEN 325 MG PO TABS
650.0000 mg | ORAL_TABLET | ORAL | Status: DC | PRN
  Administered 2019-05-18: 650 mg via ORAL
  Filled 2019-05-18: qty 2

## 2019-05-18 NOTE — Progress Notes (Signed)
PROGRESS NOTE    Kathleen Roy  ZOX:[id number] DOB: Jan 28, 1961 DOA: 05/14/2019 PCP: [name], MD    Brief Narrative:  Per Dr [name] is a 58 y.o. female with medical history significant of COPD, chronic respiratory failure on 4 L nasal cannula oxygen, HTN, HLD, diet-controled DM, dCHF, morbid obesity, OSA, hypoventilation syndrome, migraine headache, fibromyalgia, memory loss, who presents with SOB and oxygen desaturation.  Per report, pt was found to have oxygen desaturation to low 80s on baseline 4 L nasal cannula oxygen in the facility. Pt is placed on NRB at 15 L O2- 95%. She has hx of memory loss. She is mildly confused. She response to calling her name, but is not oriented to place and time.  She does not complain of any pain.  Does not seem to have chest pain or abdominal pain.  No active cough, nausea, vomiting, diarrhea noted. Not sure if pt has symptoms of UTI.  She moves all extremities on painful stimuli.  No facial droop.  ED Course: pt was found to have BNP 36.9, negative COVID-19 test, pending WBC, negative trop, renal function normal, temperature normal, heart rate in 90s, RR 25, oxygen 95% on BiPAP. VBG with pH 7.301, CO2 118, O2 49.1.  Patient is admitted to stepdown bed as inpatient.  # CXR showed:  Cardiomegaly. Diffuse lung density, similar to previous studies but possibly slightly more pronounced. This could represent mild edema superimposed upon chronic lung disease  Assessment & Plan:   Principal Problem:   Acute on chronic respiratory failure with hypoxia and hypercapnia (HCC) Active Problems:   Major depressive disorder, recurrent episode (HCC)   COPD (chronic obstructive pulmonary disease) (HCC)   Hypokalemia   OSA (obstructive sleep apnea)   Obesity hypoventilation syndrome (HCC)   Chronic diastolic congestive heart failure (HCC)   Essential hypertension   Diabetes mellitus without complication (HCC)   Macrocytic anemia   Acute metabolic  encephalopathy   Acute on chronic diastolic congestive heart failure (HCC)   Hypernatremia   1 acute on chronic respiratory failure with hypoxia and hypercapnia/COVID-19 ruled out Likely multifactorial secondary to probable acute COPD exacerbation, +/-CHF exacerbation in the setting of OSA and hypoventilation syndrome.  Concern for COVID-19 which has been ruled out.  Patient is morbidly obese, patient with decreased air movement, difficult to assess volume status.  Chest x-ray showing possible mild pulmonary edema.  Patient with increased O2 requirements was 80% on 4 L home O2 on presentation, requiring nonrebreather and now on BiPAP nightly and PRN.  Patient in the stepdown unit.  Patient more alert today on examination.  SARS-CoV-2 was negative on admission however due to patient's increasing O2 requirements was on BiPAP concern for COVID.  Repeat SARS-CoV-2 test is negative.  Respiratory viral panel negative.  Airborne precautions have been discontinued.  CT chest which was done showed thyromegaly, small bilateral effusions, scattered GGO, bilateral lower lobe airspace disease with air bronchograms.  Continue  IV steroid taper, scheduled nebs.  Change IV Levaquin to oral Levaquin.  Discontinued IV Lasix due to worsening sodium levels and contraction alkalosis.  Hopefully patient will auto diuresis.  Patient already with 950 cc urine output this morning.  PCCM following and appreciate input and recommendations..   2.  Probable acute COPD exacerbation Patient with poor to fair air movement.  Patient with acute on chronic respiratory failure was on BiPAP and now on 13 L high flow O2.  Patient on BiPAP nightly as well as  as needed per pulmonary recommendations.  ABG somewhat improving with a pH of 7.510 this morning from 7.462 from 7.301.  PCO2 decreasing and currently at 60.8.  PO2 of 60.1.  Continue IV steroid taper, Flonase, IV PPI, nebulizer treatments.  Change IV Levaquin to oral Levaquin.  PCCM  following and appreciate input and recommendations..    3.  Probable acute on chronic diastolic CHF Patient presented with problem #1.  2D echo on 08/20/2018 with a EF of 60 to 65% with grade 2 diastolic dysfunction.  Patient on admission noted to have some trace lower extremity edema.  Chest x-ray with concern for possible mild pulmonary edema.  Patient was on torsemide twice daily.  Patient was on Lasix 80 mg IV every 12 hours.  Patient with a worsening hypernatremia and a contraction alkalosis and as such IV diuretics have been held.  Monitor urine output.  Hopefully patient with auto- diuresis.  Patient with a urine output of 950 cc over the past 24 hours recorded.  Patient with 950 cc in canister this morning from pure wick..  Patient looks clinically dehydrated on examination.  2D echo with EF greater than 65%, mildly increased left ventricular wall thickness, indeterminate diastolic filling due to EA fusion, poor acoustic windows limited study even with Definity, grossly normal mitral valve and aortic valve and aortic root and ascending aorta.  Strict I's and O's.  Daily weights.    4.  Major depressive disorder recurrent Patient on oral Effexor.  Patient was lethargic and as such Effexor has been held.  Patient more alert today.  Effexor has been resumed.   5.  Acute metabolic encephalopathy and history of memory loss Likely secondary to problem #1.  Patient with no signs of source of infection noted.  See #1.  ABG with decreasing PCO2.  pH has increased to 7.510.  Patient alert and following commands, improved clinically.  Patient answering questions more appropriately.  Continue treatment for probable acute COPD exacerbation with nebulizer treatments, IV steroid taper, IV Levaquin.  Patient seems to be auto diuresing.  Follow.    6.  Obstructive sleep apnea/obesity hypoventilation syndrome On BiPAP nightly and PRN per pulmonary recommendations.   7.  Hypertension Discontinued Toprol-XL and  placed on IV Lopressor 5 mg every 8 hours.  Will discontinue IV Lopressor and placed back on home dose oral Toprol-XL.   8.  Well-controlled diabetes mellitus type 2 Last hemoglobin A1c was 4.9 on 09/20/2017.  Hemoglobin A1c 5.5.  CBG of 178 this morning.  Patient on IV steroids.  Continue sliding scale insulin.  Outpatient follow-up.   9.  Microcytic anemia H&H stable at 8.7.  Follow.  10 hypokalemia Potassium at 3.2.  K. Dur 40 mEq p.o. every 4 hours x2 doses.  Magnesium at 2.0.  11.  Hypernatremia with metabolic alkalosis Likely contraction alkalosis secondary to diuresis.  Improved with discontinuation of diuretics.  Follow.     DVT prophylaxis: Lovenox Code Status: DNR Family Communication: No family at bedside. Disposition Plan: Remain in stepdown.  Patient requiring 13 L high flow O2.     Consultants:   PCCM: Dr. Ander Slade 05/15/2019  Procedures:   CT angiogram chest 05/15/2019  Chest x-ray 05/14/2019  2D echo 05/16/2019    Antimicrobials:   IV Levaquin 05/15/2019>>>>> oral Levaquin 05/18/2019   Subjective: Patient was on BiPAP overnight and currently on 13 L high flow oxygen.  Patient sleeping however easily arousable.  Patient complaining of headache.  No shortness of breath.  Denies  any chest pain.  Denies any abdominal pain.  Poor oral intake yesterday per RN.   Objective: Vitals:   05/18/19 0505 05/18/19 0600 05/18/19 0800 05/18/19 0901  BP: (!) 141/80 (!) 161/87    Pulse: 75 78    Resp: 20 17    Temp:   98.7 F (37.1 C)   TempSrc:   Oral   SpO2: 96% 97%  95%  Weight:      Height:        Intake/Output Summary (Last 24 hours) at 05/18/2019 1005 Last data filed at 05/18/2019 0600 Gross per 24 hour  Intake 510 ml  Output 950 ml  Net -440 ml   Filed Weights   05/16/19 0500 05/17/19 0500 05/18/19 0500  Weight: (!) 142.9 kg (!) 142.9 kg (!) 145.8 kg    Examination:  General exam: Patient on 13 L high flow nasal cannula.  Alert.  Poor dentition.   Respiratory system: Diminished breath sounds in the bases.  Some scattered coarse breath sounds.  On 13 L high flow nasal cannula.  Speaking in full sentences.  Cardiovascular system: Distant heart sounds secondary to body habitus.  Regular rate rhythm no murmurs rubs or gallops.  Unable to assess JVD well due to thick neck.  No pitting edema.  Gastrointestinal system: Abdomen is soft, obese, nondistended, nontender, positive bowel sounds.  Central nervous system: Off BiPAP and on 13 L high flow nasal cannula. Moving extremities spontaneously.  Alert. Extremities: Symmetric 5 x 5 power. Skin: No rashes, lesions or ulcers Psychiatry: Judgement and insight appear poor-fair. Mood & affect appropriate.     Data Reviewed: I have personally reviewed following labs and imaging studies  CBC: Recent Labs  Lab 05/14/19 1802 05/15/19 1001 05/16/19 0512 05/17/19 0316 05/18/19 0338  WBC 6.4 6.1 5.7 4.8 4.6  NEUTROABS 4.7 4.6 4.9 3.6 3.5  HGB 9.3* 8.9* 9.1* 8.8* 8.7*  HCT 34.7* 32.2* 31.4* 31.1* 31.1*  MCV 107.8* 105.2* 102.6* 104.0* 103.0*  PLT 177 170 213 189 144   Basic Metabolic Panel: Recent Labs  Lab 05/15/19 1001 05/16/19 0221 05/17/19 0316 05/17/19 1517 05/18/19 0338  NA 147* 149* 145 145 143  K 3.0* 4.0 2.7* 3.0* 3.2*  CL 85* 90* 91* 94* 95*  CO2 49* 46* 41* 39* 36*  GLUCOSE 141* 154* 144* 239* 170*  BUN 15 16 22* 26* 22*  CREATININE 0.74 0.89 0.93 0.90 0.80  CALCIUM 9.1 9.7 9.8 10.0 9.9  MG 2.0  --  2.1  --  2.0   GFR: Estimated Creatinine Clearance: 109.6 mL/min (by C-G formula based on SCr of 0.8 mg/dL). Liver Function Tests: Recent Labs  Lab 05/14/19 1802  AST 17  ALT 16  ALKPHOS 87  BILITOT 0.5  PROT 7.3  ALBUMIN 3.4*   No results for input(s): LIPASE, AMYLASE in the last 168 hours. No results for input(s): AMMONIA in the last 168 hours. Coagulation Profile: No results for input(s): INR, PROTIME in the last 168 hours. Cardiac Enzymes: Recent Labs  Lab  05/14/19 1802 05/16/19 0221  TROPONINI <0.03 <0.03   BNP (last 3 results) No results for input(s): PROBNP in the last 8760 hours. HbA1C: Recent Labs    05/16/19 0221  HGBA1C 5.5   CBG: Recent Labs  Lab 05/17/19 0741 05/17/19 1203 05/17/19 1617 05/17/19 2135 05/18/19 0729  GLUCAP 183* 289* 191* 146* 178*   Lipid Profile: No results for input(s): CHOL, HDL, LDLCALC, TRIG, CHOLHDL, LDLDIRECT in the last 72 hours. Thyroid Function Tests:  No results for input(s): TSH, T4TOTAL, FREET4, T3FREE, THYROIDAB in the last 72 hours. Anemia Panel: No results for input(s): VITAMINB12, FOLATE, FERRITIN, TIBC, IRON, RETICCTPCT in the last 72 hours. Sepsis Labs: Recent Labs  Lab 05/14/19 1802 05/15/19 0253  LATICACIDVEN 0.8 1.2    Recent Results (from the past 240 hour(s))  SARS Coronavirus 2 (CEPHEID- Performed in [facility] hospital lab), Hosp Order     Status: None   Collection Time: 05/14/19  6:22 PM  Result Value Ref Range Status   SARS Coronavirus 2 NEGATIVE NEGATIVE Final    Comment: (NOTE) If result is NEGATIVE SARS-CoV-2 target nucleic acids are NOT DETECTED. The SARS-CoV-2 RNA is generally detectable in upper and lower  respiratory specimens during the acute phase of infection. The lowest  concentration of SARS-CoV-2 viral copies this assay can detect is 250  copies / mL. A negative result does not preclude SARS-CoV-2 infection  and should not be used as the sole basis for treatment or other  patient management decisions.  A negative result may occur with  improper specimen collection / handling, submission of specimen other  than nasopharyngeal swab, presence of viral mutation(s) within the  areas targeted by this assay, and inadequate number of viral copies  (<250 copies / mL). A negative result must be combined with clinical  observations, patient history, and epidemiological information. If result is POSITIVE SARS-CoV-2 target nucleic acids are DETECTED. The  SARS-CoV-2 RNA is generally detectable in upper and lower  respiratory specimens dur ing the acute phase of infection.  Positive  results are indicative of active infection with SARS-CoV-2.  Clinical  correlation with patient history and other diagnostic information is  necessary to determine patient infection status.  Positive results do  not rule out bacterial infection or co-infection with other viruses. If result is PRESUMPTIVE POSTIVE SARS-CoV-2 nucleic acids MAY BE PRESENT.   A presumptive positive result was obtained on the submitted specimen  and confirmed on repeat testing.  While 2019 novel coronavirus  (SARS-CoV-2) nucleic acids may be present in the submitted sample  additional confirmatory testing may be necessary for epidemiological  and / or clinical management purposes  to differentiate between  SARS-CoV-2 and other Sarbecovirus currently known to infect humans.  If clinically indicated additional testing with an alternate test  methodology [phone]) is advised. The SARS-CoV-2 RNA is generally  detectable in upper and lower respiratory sp ecimens during the acute  phase of infection. The expected result is Negative. Fact Sheet for Patients:  StrictlyIdeas.no Fact Sheet for Healthcare Providers: BankingDealers.co.za This test is not yet approved or cleared by the [place] FDA and has been authorized for detection and/or diagnosis of SARS-CoV-2 by FDA under an Emergency Use Authorization (EUA).  This EUA will remain in effect (meaning this test can be used) for the duration of the COVID-19 declaration under Section 564(b)(1) of the Act, 21 U.S.C. section 360bbb-3(b)(1), unless the authorization is terminated or revoked sooner. Performed at [facility], [place] [address], [place], [place] [address]   MRSA PCR Screening     Status: Abnormal   Collection Time: 05/14/19 11:54 PM  Result Value Ref Range  Status   MRSA by PCR POSITIVE (A) NEGATIVE Final    Comment:        The GeneXpert MRSA Assay (FDA approved for NASAL specimens only), is one component of a comprehensive MRSA colonization surveillance program. It is not intended to diagnose MRSA infection nor to guide or monitor treatment for  MRSA infections. RESULT CALLED TO, READ BACK BY AND VERIFIED WITH: LANELY,S RN @0303  ON 05/15/2019 JACKSON,K Performed at [facility], [place] [address], [place], [place] [address]   Respiratory Panel by PCR     Status: None   Collection Time: 05/15/19  2:35 AM  Result Value Ref Range Status   Adenovirus NOT DETECTED NOT DETECTED Final   Coronavirus 229E NOT DETECTED NOT DETECTED Final    Comment: (NOTE) The Coronavirus on the Respiratory Panel, DOES NOT test for the novel  Coronavirus (2019 nCoV)    Coronavirus HKU1 NOT DETECTED NOT DETECTED Final   Coronavirus NL63 NOT DETECTED NOT DETECTED Final   Coronavirus OC43 NOT DETECTED NOT DETECTED Final   Metapneumovirus NOT DETECTED NOT DETECTED Final   Rhinovirus / Enterovirus NOT DETECTED NOT DETECTED Final   Influenza A NOT DETECTED NOT DETECTED Final   Influenza B NOT DETECTED NOT DETECTED Final   Parainfluenza Virus 1 NOT DETECTED NOT DETECTED Final   Parainfluenza Virus 2 NOT DETECTED NOT DETECTED Final   Parainfluenza Virus 3 NOT DETECTED NOT DETECTED Final   Parainfluenza Virus 4 NOT DETECTED NOT DETECTED Final   Respiratory Syncytial Virus NOT DETECTED NOT DETECTED Final   Bordetella pertussis NOT DETECTED NOT DETECTED Final   Chlamydophila pneumoniae NOT DETECTED NOT DETECTED Final   Mycoplasma pneumoniae NOT DETECTED NOT DETECTED Final    Comment: Performed at [facility], [place], [place], [place] [address]  SARS Coronavirus 2 [facility] order, Performed in [facility] hospital lab)     Status: None   Collection Time: 05/15/19  9:03 AM  Result Value Ref Range Status   SARS Coronavirus 2 NEGATIVE  NEGATIVE Final    Comment: (NOTE) If result is NEGATIVE SARS-CoV-2 target nucleic acids are NOT DETECTED. The SARS-CoV-2 RNA is generally detectable in upper and lower  respiratory specimens during the acute phase of infection. The lowest  concentration of SARS-CoV-2 viral copies this assay can detect is 250  copies / mL. A negative result does not preclude SARS-CoV-2 infection  and should not be used as the sole basis for treatment or other  patient management decisions.  A negative result may occur with  improper specimen collection / handling, submission of specimen other  than nasopharyngeal swab, presence of viral mutation(s) within the  areas targeted by this assay, and inadequate number of viral copies  (<250 copies / mL). A negative result must be combined with clinical  observations, patient history, and epidemiological information. If result is POSITIVE SARS-CoV-2 target nucleic acids are DETECTED. The SARS-CoV-2 RNA is generally detectable in upper and lower  respiratory specimens dur ing the acute phase of infection.  Positive  results are indicative of active infection with SARS-CoV-2.  Clinical  correlation with patient history and other diagnostic information is  necessary to determine patient infection status.  Positive results do  not rule out bacterial infection or co-infection with other viruses. If result is PRESUMPTIVE POSTIVE SARS-CoV-2 nucleic acids MAY BE PRESENT.   A presumptive positive result was obtained on the submitted specimen  and confirmed on repeat testing.  While 2019 novel coronavirus  (SARS-CoV-2) nucleic acids may be present in the submitted sample  additional confirmatory testing may be necessary for epidemiological  and / or clinical management purposes  to differentiate between  SARS-CoV-2 and other Sarbecovirus currently known to infect humans.  If clinically indicated additional testing with an alternate test  methodology [phone]) is  advised. The SARS-CoV-2 RNA  is generally  detectable in upper and lower respiratory sp ecimens during the acute  phase of infection. The expected result is Negative. Fact Sheet for Patients:  StrictlyIdeas.no Fact Sheet for Healthcare Providers: BankingDealers.co.za This test is not yet approved or cleared by the [place] FDA and has been authorized for detection and/or diagnosis of SARS-CoV-2 by FDA under an Emergency Use Authorization (EUA).  This EUA will remain in effect (meaning this test can be used) for the duration of the COVID-19 declaration under Section 564(b)(1) of the Act, 21 U.S.C. section 360bbb-3(b)(1), unless the authorization is terminated or revoked sooner. Performed at [facility], [place] [address] [place], [place] [address]          Radiology Studies: No results found.      Scheduled Meds: . Chlorhexidine Gluconate Cloth  6 each Topical Q0600  . cholecalciferol  1,000 Units Oral Daily  . enoxaparin (LOVENOX) injection  60 mg Subcutaneous Daily  . fluticasone  1 spray Each Nare Daily  . fluticasone furoate-vilanterol  1 puff Inhalation Daily  . insulin aspart  0-20 Units Subcutaneous TID WC  . lamoTRIgine  25 mg Oral Daily  . loratadine  10 mg Oral Daily  . mouth rinse  15 mL Mouth Rinse q12n4p  . methylPREDNISolone (SOLU-MEDROL) injection  40 mg Intravenous Q8H  . metoprolol tartrate  5 mg Intravenous Q8H  . multivitamin  15 mL Oral Daily  . mupirocin ointment  1 application Nasal BID  . omega-3 acid ethyl esters  1 g Oral Daily  . pantoprazole  40 mg Oral Daily  . potassium chloride  40 mEq Oral Q4H  . senna  2 tablet Oral QHS  . venlafaxine XR  300 mg Oral Q breakfast   And  . venlafaxine XR  37.5 mg Oral Q breakfast  . vitamin C  3,000 mg Oral Daily   Continuous Infusions: . levofloxacin (LEVAQUIN) IV Stopped (05/17/19 1823)     LOS: 4 days    Time spent: 35 mins     Irine Seal, MD [facility]  If 7PM-7AM, please contact night-coverage [email or web address] 05/18/2019, 10:05 AM

## 2019-05-19 LAB — CBC WITH DIFFERENTIAL/PLATELET
Abs Immature Granulocytes: 0.02 10*3/uL (ref 0.00–0.07)
Basophils Absolute: 0 10*3/uL (ref 0.0–0.1)
Basophils Relative: 0 %
Eosinophils Absolute: 0 10*3/uL (ref 0.0–0.5)
Eosinophils Relative: 0 %
HCT: 30.9 % — ABNORMAL LOW (ref 36.0–46.0)
Hemoglobin: 9 g/dL — ABNORMAL LOW (ref 12.0–15.0)
Immature Granulocytes: 0 %
Lymphocytes Relative: 21 %
Lymphs Abs: 1.2 10*3/uL (ref 0.7–4.0)
MCH: 29.4 pg (ref 26.0–34.0)
MCHC: 29.1 g/dL — ABNORMAL LOW (ref 30.0–36.0)
MCV: 101 fL — ABNORMAL HIGH (ref 80.0–100.0)
Monocytes Absolute: 0.6 10*3/uL (ref 0.1–1.0)
Monocytes Relative: 10 %
Neutro Abs: 3.9 10*3/uL (ref 1.7–7.7)
Neutrophils Relative %: 69 %
Platelets: 162 10*3/uL (ref 150–400)
RBC: 3.06 MIL/uL — ABNORMAL LOW (ref 3.87–5.11)
RDW: 16.4 % — ABNORMAL HIGH (ref 11.5–15.5)
WBC: 5.7 10*3/uL (ref 4.0–10.5)
nRBC: 0 % (ref 0.0–0.2)

## 2019-05-19 LAB — BASIC METABOLIC PANEL
Anion gap: 8 (ref 5–15)
BUN: 21 mg/dL — ABNORMAL HIGH (ref 6–20)
CO2: 35 mmol/L — ABNORMAL HIGH (ref 22–32)
Calcium: 9.5 mg/dL (ref 8.9–10.3)
Chloride: 97 mmol/L — ABNORMAL LOW (ref 98–111)
Creatinine, Ser: 0.73 mg/dL (ref 0.44–1.00)
GFR calc Af Amer: >60 mL/min (ref >60)
GFR calc non Af Amer: >60 mL/min (ref >60)
Glucose, Bld: 169 mg/dL — ABNORMAL HIGH (ref 70–99)
Potassium: 3.6 mmol/L (ref 3.5–5.1)
Sodium: 140 mmol/L (ref 135–145)

## 2019-05-19 LAB — GLUCOSE, CAPILLARY
Glucose-Capillary: 182 mg/dL — ABNORMAL HIGH (ref 70–99)
Glucose-Capillary: 226 mg/dL — ABNORMAL HIGH (ref 70–99)
Glucose-Capillary: 213 mg/dL — ABNORMAL HIGH (ref 70–99)
Glucose-Capillary: 145 mg/dL — ABNORMAL HIGH (ref 70–99)

## 2019-05-19 MED ORDER — METHYLPREDNISOLONE SODIUM SUCC 40 MG IJ SOLR
40.0000 mg | Freq: Two times a day (BID) | INTRAMUSCULAR | Status: DC
  Administered 2019-05-19 – 2019-05-21 (×4): 40 mg via INTRAVENOUS
  Filled 2019-05-19 (×4): qty 1

## 2019-05-19 NOTE — Progress Notes (Signed)
PROGRESS NOTE    Kathleen Roy  IRS:[id number] DOB: [birth year] DOA: 05/14/2019 PCP: [name], MD     Brief Narrative:  Kathleen Roy a 58 y.o.femalewith medical history significant ofCOPD,chronic respiratory failure on 4 L nasal cannula oxygen,HTN, HLD, diet-controlled DM, dCHF,morbid obesity, OSA, hypoventilation syndrome, migraine headache, fibromyalgia, memory loss, who presents withSOB andoxygen desaturation. Per report, pt was found to haveoxygen desaturation to low 80s on baseline 4 L nasal cannula oxygen in the facility.Ptisplaced on NRB at 15 L O2- 95%. She has hx ofmemory loss. She is mildly confused. She response to calling her name, but is not oriented to place and time.She does not complain of any pain.Does not seem to have chest pain or abdominal pain. No active cough, nausea,vomiting, diarrhea noted. Not sure if pt hassymptoms of UTI. She moves all extremities on painful stimuli. No facial droop.  ED Course: Pt was found to have BNP 36.9, negative COVID-19 test, negative trop,renal function normal, temperature normal, heart rate in 90s, RR 25, oxygen 95% on BiPAP.VBG withpH7.301, CO2 118, O2 49.1. Patient is admitted to stepdown bed as inpatient.  Interim: Patient has been treated for acute on chronic respiratory failure with hypoxia and hypercapnia, with concern for probable acute COPD exacerbation and acute on chronic diastolic heart failure.  PCCM was consulted.  New events last 24 hours / Subjective: No complaints other than being thirsty.  Remains on high flow nasal cannula O2 this morning.  Assessment & Plan:   Principal Problem:   Acute on chronic respiratory failure with hypoxia and hypercapnia (HCC) Active Problems:   Major depressive disorder, recurrent episode (HCC)   COPD (chronic obstructive pulmonary disease) (HCC)   Hypokalemia   OSA (obstructive sleep apnea)   Obesity hypoventilation syndrome (HCC)   Chronic diastolic  congestive heart failure (HCC)   Essential hypertension   Diabetes mellitus without complication (HCC)   Macrocytic anemia   Acute metabolic encephalopathy   Acute on chronic diastolic congestive heart failure (HCC)   Hypernatremia   Acute on chronic respiratory failure with hypoxia and hypercapnia -Likely multifactorial secondary to probable acute COPD exacerbation, acute on chronic diastolic CHF exacerbation in the setting of OSA and hypoventilation syndrome -SARS-CoV2 negative x2 -Respiratory viral panel negative -CXR: Diffuse lung density, similar to previous studies but possibly slightly more pronounced. -CTA chest: Dependent bilateral lower lobe airspace disease could be due to pneumonia, atelectasis or aspiration. Cardiomegaly and findings compatible with mild interstitial pulmonary edema. Small pleural effusions noted. -PCCM consulted, signed off 6/1  Acute COPD exacerbation -Continue IV steroid taper, scheduled nebs. Completed 5 day levaquin   Acute on chronic diastolic CHF -Echo EF of 60 to 65% with grade 2 diastolic dysfunction -Patient was on torsemide twice daily.  Patient was on Lasix 80 mg IV every 12 hours.  Patient with a worsening hypernatremia and a contraction alkalosis and as such IV diuretics have been held -Strict I's and O's.  Daily weights.    Major depressive disorder, recurrent -Continue Effexor, lamictal   Acute metabolic encephalopathy and history of memory loss -Likely secondary to problem #1. Stable     Obstructive sleep apnea/obesity hypoventilation syndrome -On BiPAP nightly and PRN per pulmonary recommendations  Hypertension -Continue Toprol-XL   Well-controlled diabetes mellitus type 2 -Hemoglobin A1c 5.5. SSI   HLD -Continue pravachol    DVT prophylaxis: Lovenox Code Status: DNR Family Communication: None Disposition Plan: Continue to wean O2 as able, discharged back to [facility] once stable   Consultants:  PCCM   Procedures:   None  Antimicrobials:  Anti-infectives (From admission, onward)   Start     Dose/Rate Route Frequency Ordered Stop   05/18/19 1100  levofloxacin (LEVAQUIN) tablet 750 mg  Status:  Discontinued     750 mg Oral Daily 05/18/19 1017 05/19/19 1249   05/15/19 1800  levofloxacin (LEVAQUIN) IVPB 500 mg  Status:  Discontinued     500 mg 100 mL/hr over 60 Minutes Intravenous Every 24 hours 05/15/19 1607 05/15/19 1622   05/15/19 1800  levofloxacin (LEVAQUIN) IVPB 750 mg  Status:  Discontinued     750 mg 100 mL/hr over 90 Minutes Intravenous Every 24 hours 05/15/19 1622 05/18/19 1017       Objective: Vitals:   05/19/19 0800 05/19/19 0900 05/19/19 1100 05/19/19 1200  BP: 129/80 (!) 103/49 135/83 (!) 161/123  Pulse: 83 93 84 80  Resp: (!) 21 16 (!) 28 (!) 23  Temp:    97.7 F (36.5 C)  TempSrc:    Oral  SpO2: 97% 95% 96% 96%  Weight:      Height:        Intake/Output Summary (Last 24 hours) at 05/19/2019 1250 Last data filed at 05/19/2019 1100 Gross per 24 hour  Intake -  Output 700 ml  Net -700 ml   Filed Weights   05/17/19 0500 05/18/19 0500 05/19/19 0500  Weight: (!) 142.9 kg (!) 145.8 kg (!) 145.7 kg    Examination:  General exam: Appears calm and comfortable  Respiratory system: Clear to auscultation. Respiratory effort normal. Remain on HFNC O2  Cardiovascular system: S1 & S2 heard, RRR. No JVD, murmurs, rubs, gallops or clicks. No pedal edema. Gastrointestinal system: Abdomen is nondistended, soft and nontender. No organomegaly or masses felt. Normal bowel sounds heard. Central nervous system: Alert Extremities: Symmetric 5 x 5 power. Skin: No rashes, lesions or ulcers Psychiatry: Judgement and insight appear stable   Data Reviewed: I have personally reviewed following labs and imaging studies  CBC: Recent Labs  Lab 05/15/19 1001 05/16/19 0512 05/17/19 0316 05/18/19 0338 05/19/19 0258  WBC 6.1 5.7 4.8 4.6 5.7  NEUTROABS 4.6 4.9 3.6 3.5 3.9  HGB  8.9* 9.1* 8.8* 8.7* 9.0*  HCT 32.2* 31.4* 31.1* 31.1* 30.9*  MCV 105.2* 102.6* 104.0* 103.0* 101.0*  PLT 170 213 189 182 034   Basic Metabolic Panel: Recent Labs  Lab 05/15/19 1001 05/16/19 0221 05/17/19 0316 05/17/19 1517 05/18/19 0338 05/19/19 0258  NA 147* 149* 145 145 143 140  K 3.0* 4.0 2.7* 3.0* 3.2* 3.6  CL 85* 90* 91* 94* 95* 97*  CO2 49* 46* 41* 39* 36* 35*  GLUCOSE 141* 154* 144* 239* 170* 169*  BUN 15 16 22* 26* 22* 21*  CREATININE 0.74 0.89 0.93 0.90 0.80 0.73  CALCIUM 9.1 9.7 9.8 10.0 9.9 9.5  MG 2.0  --  2.1  --  2.0  --    GFR: Estimated Creatinine Clearance: 108.2 mL/min (by C-G formula based on SCr of 0.73 mg/dL). Liver Function Tests: Recent Labs  Lab 05/14/19 1802  AST 17  ALT 16  ALKPHOS 87  BILITOT 0.5  PROT 7.3  ALBUMIN 3.4*   No results for input(s): LIPASE, AMYLASE in the last 168 hours. No results for input(s): AMMONIA in the last 168 hours. Coagulation Profile: No results for input(s): INR, PROTIME in the last 168 hours. Cardiac Enzymes: Recent Labs  Lab 05/14/19 1802 05/16/19 0221  TROPONINI <0.03 <0.03   BNP (last 3 results) No  results for input(s): PROBNP in the last 8760 hours. HbA1C: No results for input(s): HGBA1C in the last 72 hours. CBG: Recent Labs  Lab 05/18/19 1113 05/18/19 1627 05/18/19 2344 05/19/19 0734 05/19/19 1137  GLUCAP 177* 127* 212* 182* 226*   Lipid Profile: No results for input(s): CHOL, HDL, LDLCALC, TRIG, CHOLHDL, LDLDIRECT in the last 72 hours. Thyroid Function Tests: No results for input(s): TSH, T4TOTAL, FREET4, T3FREE, THYROIDAB in the last 72 hours. Anemia Panel: No results for input(s): VITAMINB12, FOLATE, FERRITIN, TIBC, IRON, RETICCTPCT in the last 72 hours. Sepsis Labs: Recent Labs  Lab 05/14/19 1802 05/15/19 0253  LATICACIDVEN 0.8 1.2    Recent Results (from the past 240 hour(s))  SARS Coronavirus 2 (CEPHEID- Performed in [facility] hospital lab), Hosp Order     Status: None    Collection Time: 05/14/19  6:22 PM  Result Value Ref Range Status   SARS Coronavirus 2 NEGATIVE NEGATIVE Final    Comment: (NOTE) If result is NEGATIVE SARS-CoV-2 target nucleic acids are NOT DETECTED. The SARS-CoV-2 RNA is generally detectable in upper and lower  respiratory specimens during the acute phase of infection. The lowest  concentration of SARS-CoV-2 viral copies this assay can detect is 250  copies / mL. A negative result does not preclude SARS-CoV-2 infection  and should not be used as the sole basis for treatment or other  patient management decisions.  A negative result may occur with  improper specimen collection / handling, submission of specimen other  than nasopharyngeal swab, presence of viral mutation(s) within the  areas targeted by this assay, and inadequate number of viral copies  (<250 copies / mL). A negative result must be combined with clinical  observations, patient history, and epidemiological information. If result is POSITIVE SARS-CoV-2 target nucleic acids are DETECTED. The SARS-CoV-2 RNA is generally detectable in upper and lower  respiratory specimens dur ing the acute phase of infection.  Positive  results are indicative of active infection with SARS-CoV-2.  Clinical  correlation with patient history and other diagnostic information is  necessary to determine patient infection status.  Positive results do  not rule out bacterial infection or co-infection with other viruses. If result is PRESUMPTIVE POSTIVE SARS-CoV-2 nucleic acids MAY BE PRESENT.   A presumptive positive result was obtained on the submitted specimen  and confirmed on repeat testing.  While 2019 novel coronavirus  (SARS-CoV-2) nucleic acids may be present in the submitted sample  additional confirmatory testing may be necessary for epidemiological  and / or clinical management purposes  to differentiate between  SARS-CoV-2 and other Sarbecovirus currently known to infect humans.   If clinically indicated additional testing with an alternate test  methodology [phone]) is advised. The SARS-CoV-2 RNA is generally  detectable in upper and lower respiratory sp ecimens during the acute  phase of infection. The expected result is Negative. Fact Sheet for Patients:  StrictlyIdeas.no Fact Sheet for Healthcare Providers: BankingDealers.co.za This test is not yet approved or cleared by the [place] FDA and has been authorized for detection and/or diagnosis of SARS-CoV-2 by FDA under an Emergency Use Authorization (EUA).  This EUA will remain in effect (meaning this test can be used) for the duration of the COVID-19 declaration under Section 564(b)(1) of the Act, 21 U.S.C. section 360bbb-3(b)(1), unless the authorization is terminated or revoked sooner. Performed at [facility], [place] [address], [place], [place] [address]   MRSA PCR Screening     Status: Abnormal   Collection Time:  05/14/19 11:54 PM  Result Value Ref Range Status   MRSA by PCR POSITIVE (A) NEGATIVE Final    Comment:        The GeneXpert MRSA Assay (FDA approved for NASAL specimens only), is one component of a comprehensive MRSA colonization surveillance program. It is not intended to diagnose MRSA infection nor to guide or monitor treatment for MRSA infections. RESULT CALLED TO, READ BACK BY AND VERIFIED WITH: LANELY,S RN @0303  ON 05/15/2019 JACKSON,K Performed at [facility], [place] [address], [place], [place] [address]   Respiratory Panel by PCR     Status: None   Collection Time: 05/15/19  2:35 AM  Result Value Ref Range Status   Adenovirus NOT DETECTED NOT DETECTED Final   Coronavirus 229E NOT DETECTED NOT DETECTED Final    Comment: (NOTE) The Coronavirus on the Respiratory Panel, DOES NOT test for the novel  Coronavirus (2019 nCoV)    Coronavirus HKU1 NOT DETECTED NOT DETECTED Final   Coronavirus  NL63 NOT DETECTED NOT DETECTED Final   Coronavirus OC43 NOT DETECTED NOT DETECTED Final   Metapneumovirus NOT DETECTED NOT DETECTED Final   Rhinovirus / Enterovirus NOT DETECTED NOT DETECTED Final   Influenza A NOT DETECTED NOT DETECTED Final   Influenza B NOT DETECTED NOT DETECTED Final   Parainfluenza Virus 1 NOT DETECTED NOT DETECTED Final   Parainfluenza Virus 2 NOT DETECTED NOT DETECTED Final   Parainfluenza Virus 3 NOT DETECTED NOT DETECTED Final   Parainfluenza Virus 4 NOT DETECTED NOT DETECTED Final   Respiratory Syncytial Virus NOT DETECTED NOT DETECTED Final   Bordetella pertussis NOT DETECTED NOT DETECTED Final   Chlamydophila pneumoniae NOT DETECTED NOT DETECTED Final   Mycoplasma pneumoniae NOT DETECTED NOT DETECTED Final    Comment: Performed at [facility], [place]. [address] [place], [place] [address]  SARS Coronavirus 2 [facility] order, Performed in [facility] hospital lab)     Status: None   Collection Time: 05/15/19  9:03 AM  Result Value Ref Range Status   SARS Coronavirus 2 NEGATIVE NEGATIVE Final    Comment: (NOTE) If result is NEGATIVE SARS-CoV-2 target nucleic acids are NOT DETECTED. The SARS-CoV-2 RNA is generally detectable in upper and lower  respiratory specimens during the acute phase of infection. The lowest  concentration of SARS-CoV-2 viral copies this assay can detect is 250  copies / mL. A negative result does not preclude SARS-CoV-2 infection  and should not be used as the sole basis for treatment or other  patient management decisions.  A negative result may occur with  improper specimen collection / handling, submission of specimen other  than nasopharyngeal swab, presence of viral mutation(s) within the  areas targeted by this assay, and inadequate number of viral copies  (<250 copies / mL). A negative result must be combined with clinical  observations, patient history, and epidemiological information. If result is POSITIVE SARS-CoV-2  target nucleic acids are DETECTED. The SARS-CoV-2 RNA is generally detectable in upper and lower  respiratory specimens dur ing the acute phase of infection.  Positive  results are indicative of active infection with SARS-CoV-2.  Clinical  correlation with patient history and other diagnostic information is  necessary to determine patient infection status.  Positive results do  not rule out bacterial infection or co-infection with other viruses. If result is PRESUMPTIVE POSTIVE SARS-CoV-2 nucleic acids MAY BE PRESENT.   A presumptive positive result was obtained on the submitted specimen  and confirmed on repeat testing.  While 2019 novel coronavirus  (SARS-CoV-2) nucleic acids may be present in the submitted sample  additional confirmatory testing may be necessary for epidemiological  and / or clinical management purposes  to differentiate between  SARS-CoV-2 and other Sarbecovirus currently known to infect humans.  If clinically indicated additional testing with an alternate test  methodology [phone]) is advised. The SARS-CoV-2 RNA is generally  detectable in upper and lower respiratory sp ecimens during the acute  phase of infection. The expected result is Negative. Fact Sheet for Patients:  StrictlyIdeas.no Fact Sheet for Healthcare Providers: BankingDealers.co.za This test is not yet approved or cleared by the [place] FDA and has been authorized for detection and/or diagnosis of SARS-CoV-2 by FDA under an Emergency Use Authorization (EUA).  This EUA will remain in effect (meaning this test can be used) for the duration of the COVID-19 declaration under Section 564(b)(1) of the Act, 21 U.S.C. section 360bbb-3(b)(1), unless the authorization is terminated or revoked sooner. Performed at [facility], [place] [address], [place], [place] [address]        Radiology Studies: No results found.    Scheduled  Meds: . cholecalciferol  1,000 Units Oral Daily  . enoxaparin (LOVENOX) injection  60 mg Subcutaneous Daily  . fluticasone  1 spray Each Nare Daily  . fluticasone furoate-vilanterol  1 puff Inhalation Daily  . insulin aspart  0-20 Units Subcutaneous TID WC  . lamoTRIgine  25 mg Oral Daily  . loratadine  10 mg Oral Daily  . mouth rinse  15 mL Mouth Rinse q12n4p  . methylPREDNISolone (SOLU-MEDROL) injection  40 mg Intravenous Q12H  . metoprolol succinate  12.5 mg Oral Daily  . multivitamin  15 mL Oral Daily  . mupirocin ointment  1 application Nasal BID  . omega-3 acid ethyl esters  1 g Oral Daily  . pantoprazole  40 mg Oral Daily  . pravastatin  40 mg Oral QHS  . senna  2 tablet Oral QHS  . venlafaxine XR  300 mg Oral Q breakfast   And  . venlafaxine XR  37.5 mg Oral Q breakfast  . vitamin C  3,000 mg Oral Daily   Continuous Infusions:   LOS: 5 days    Time spent: 40 minutes   Dessa Phi, DO [facility] [email or web address] 05/19/2019, 12:50 PM

## 2019-05-20 LAB — BASIC METABOLIC PANEL
Anion gap: 8 (ref 5–15)
BUN: 23 mg/dL — ABNORMAL HIGH (ref 6–20)
CO2: 33 mmol/L — ABNORMAL HIGH (ref 22–32)
Calcium: 9.6 mg/dL (ref 8.9–10.3)
Chloride: 98 mmol/L (ref 98–111)
Creatinine, Ser: 0.84 mg/dL (ref 0.44–1.00)
GFR calc Af Amer: >60 mL/min (ref >60)
GFR calc non Af Amer: >60 mL/min (ref >60)
Glucose, Bld: 222 mg/dL — ABNORMAL HIGH (ref 70–99)
Potassium: 5 mmol/L (ref 3.5–5.1)
Sodium: 139 mmol/L (ref 135–145)

## 2019-05-20 LAB — GLUCOSE, CAPILLARY
Glucose-Capillary: 166 mg/dL — ABNORMAL HIGH (ref 70–99)
Glucose-Capillary: 189 mg/dL — ABNORMAL HIGH (ref 70–99)
Glucose-Capillary: 100 mg/dL — ABNORMAL HIGH (ref 70–99)
Glucose-Capillary: 130 mg/dL — ABNORMAL HIGH (ref 70–99)

## 2019-05-20 MED ORDER — CHLORHEXIDINE GLUCONATE CLOTH 2 % EX PADS
6.0000 | MEDICATED_PAD | Freq: Every day | CUTANEOUS | Status: DC
  Administered 2019-05-20 – 2019-05-23 (×3): 6 via TOPICAL

## 2019-05-20 NOTE — Progress Notes (Signed)
PROGRESS NOTE    Kathleen Roy  IPJ:[id number] DOB: [birth year] DOA: 05/14/2019 PCP: [name], MD     Brief Narrative:  Kathleen Roy a 58 y.o.femalewith medical history significant ofCOPD,chronic respiratory failure on 4 L nasal cannula oxygen,HTN, HLD, diet-controlled DM, dCHF,morbid obesity, OSA, hypoventilation syndrome, migraine headache, fibromyalgia, memory loss, who presents withSOB andoxygen desaturation. Per report, pt was found to haveoxygen desaturation to low 80s on baseline 4 L nasal cannula oxygen in the facility.Ptisplaced on NRB at 15 L O2- 95%. She has hx ofmemory loss. She is mildly confused. She response to calling her name, but is not oriented to place and time.She does not complain of any pain.Does not seem to have chest pain or abdominal pain. No active cough, nausea,vomiting, diarrhea noted. Not sure if pt hassymptoms of UTI. She moves all extremities on painful stimuli. No facial droop.  ED Course: Pt was found to have BNP 36.9, negative COVID-19 test, negative trop,renal function normal, temperature normal, heart rate in 90s, RR 25, oxygen 95% on BiPAP.VBG withpH7.301, CO2 118, O2 49.1. Patient is admitted to stepdown bed as inpatient.  Interim: Patient has been treated for acute on chronic respiratory failure with hypoxia and hypercapnia, with concern for probable acute COPD exacerbation and acute on chronic diastolic heart failure.  PCCM was consulted.  New events last 24 hours / Subjective: On BiPAP this morning, states she did not get much rest, wants to get off the mask to eat breakfast. No complaints.   Assessment & Plan:   Principal Problem:   Acute on chronic respiratory failure with hypoxia and hypercapnia (HCC) Active Problems:   Major depressive disorder, recurrent episode (HCC)   COPD (chronic obstructive pulmonary disease) (HCC)   Hypokalemia   OSA (obstructive sleep apnea)   Obesity hypoventilation syndrome (HCC)  Chronic diastolic congestive heart failure (HCC)   Essential hypertension   Diabetes mellitus without complication (HCC)   Macrocytic anemia   Acute metabolic encephalopathy   Acute on chronic diastolic congestive heart failure (HCC)   Hypernatremia   Acute on chronic respiratory failure with hypoxia and hypercapnia -Likely multifactorial secondary to probable acute COPD exacerbation, acute on chronic diastolic CHF exacerbation in the setting of OSA and hypoventilation syndrome -SARS-CoV2 negative x2 -Respiratory viral panel negative -CXR: Diffuse lung density, similar to previous studies but possibly slightly more pronounced. -CTA chest: Dependent bilateral lower lobe airspace disease could be due to pneumonia, atelectasis or aspiration. Cardiomegaly and findings compatible with mild interstitial pulmonary edema. Small pleural effusions noted. -PCCM consulted, signed off 6/1  Acute COPD exacerbation -Continue IV steroid taper, scheduled nebs. Completed 5 day levaquin   Acute on chronic diastolic CHF -Echo EF of 60 to 65% with grade 2 diastolic dysfunction -Patient was on torsemide twice daily.  Patient was on Lasix 80 mg IV every 12 hours.  Patient with a worsening hypernatremia and a contraction alkalosis and as such IV diuretics have been held -Strict I's and O's.  Daily weights.    Major depressive disorder, recurrent -Continue Effexor, lamictal   Acute metabolic encephalopathy and history of memory loss -Likely secondary to problem #1. Stable     Obstructive sleep apnea/obesity hypoventilation syndrome -On BiPAP nightly and PRN per pulmonary recommendations  Hypertension -Continue Toprol-XL   Well-controlled diabetes mellitus type 2 -Hemoglobin A1c 5.5. SSI   HLD -Continue pravachol    DVT prophylaxis: Lovenox Code Status: DNR Family Communication: None Disposition Plan: Continue to wean O2 as able, discharged back to [facility]  once stable    Consultants:   PCCM  Procedures:   None  Antimicrobials:  Anti-infectives (From admission, onward)   Start     Dose/Rate Route Frequency Ordered Stop   05/18/19 1100  levofloxacin (LEVAQUIN) tablet 750 mg  Status:  Discontinued     750 mg Oral Daily 05/18/19 1017 05/19/19 1249   05/15/19 1800  levofloxacin (LEVAQUIN) IVPB 500 mg  Status:  Discontinued     500 mg 100 mL/hr over 60 Minutes Intravenous Every 24 hours 05/15/19 1607 05/15/19 1622   05/15/19 1800  levofloxacin (LEVAQUIN) IVPB 750 mg  Status:  Discontinued     750 mg 100 mL/hr over 90 Minutes Intravenous Every 24 hours 05/15/19 1622 05/18/19 1017       Objective: Vitals:   05/20/19 0900 05/20/19 1000 05/20/19 1100 05/20/19 1125  BP: (!) 110/49 132/81 113/67   Pulse: 99 94 92   Resp: (!) 23 (!) 22 17   Temp:      TempSrc:      SpO2: 94% 100% 97% 98%  Weight:      Height:        Intake/Output Summary (Last 24 hours) at 05/20/2019 1131 Last data filed at 05/20/2019 0900 Gross per 24 hour  Intake 120 ml  Output -  Net 120 ml   Filed Weights   05/18/19 0500 05/19/19 0500 05/20/19 0500  Weight: (!) 145.8 kg (!) 145.7 kg (!) 145.8 kg    Examination: General exam: Appears calm and comfortable  Respiratory system: Diminished, on BiPaP  Cardiovascular system: S1 & S2 heard, RRR. No JVD, murmurs, rubs, gallops or clicks. Trace pedal edema. Gastrointestinal system: Abdomen is nondistended, soft and nontender. No organomegaly or masses felt. Normal bowel sounds heard. Central nervous system: Alert and oriented. No focal neurological deficits. Extremities: Symmetric 5 x 5 power. Skin: No rashes, lesions or ulcers Psychiatry: Judgement and insight appear normal. Mood & affect appropriate.    Data Reviewed: I have personally reviewed following labs and imaging studies  CBC: Recent Labs  Lab 05/15/19 1001 05/16/19 0512 05/17/19 0316 05/18/19 0338 05/19/19 0258  WBC 6.1 5.7 4.8 4.6 5.7  NEUTROABS 4.6 4.9 3.6  3.5 3.9  HGB 8.9* 9.1* 8.8* 8.7* 9.0*  HCT 32.2* 31.4* 31.1* 31.1* 30.9*  MCV 105.2* 102.6* 104.0* 103.0* 101.0*  PLT 170 213 189 182 767   Basic Metabolic Panel: Recent Labs  Lab 05/15/19 1001  05/17/19 0316 05/17/19 1517 05/18/19 0338 05/19/19 0258 05/20/19 0347  NA 147*   < > 145 145 143 140 139  K 3.0*   < > 2.7* 3.0* 3.2* 3.6 5.0  CL 85*   < > 91* 94* 95* 97* 98  CO2 49*   < > 41* 39* 36* 35* 33*  GLUCOSE 141*   < > 144* 239* 170* 169* 222*  BUN 15   < > 22* 26* 22* 21* 23*  CREATININE 0.74   < > 0.93 0.90 0.80 0.73 0.84  CALCIUM 9.1   < > 9.8 10.0 9.9 9.5 9.6  MG 2.0  --  2.1  --  2.0  --   --    < > = values in this interval not displayed.   GFR: Estimated Creatinine Clearance: 103.1 mL/min (by C-G formula based on SCr of 0.84 mg/dL). Liver Function Tests: Recent Labs  Lab 05/14/19 1802  AST 17  ALT 16  ALKPHOS 87  BILITOT 0.5  PROT 7.3  ALBUMIN 3.4*   No results for  input(s): LIPASE, AMYLASE in the last 168 hours. No results for input(s): AMMONIA in the last 168 hours. Coagulation Profile: No results for input(s): INR, PROTIME in the last 168 hours. Cardiac Enzymes: Recent Labs  Lab 05/14/19 1802 05/16/19 0221  TROPONINI <0.03 <0.03   BNP (last 3 results) No results for input(s): PROBNP in the last 8760 hours. HbA1C: No results for input(s): HGBA1C in the last 72 hours. CBG: Recent Labs  Lab 05/19/19 0734 05/19/19 1137 05/19/19 1559 05/19/19 2132 05/20/19 0755  GLUCAP 182* 226* 213* 145* 166*   Lipid Profile: No results for input(s): CHOL, HDL, LDLCALC, TRIG, CHOLHDL, LDLDIRECT in the last 72 hours. Thyroid Function Tests: No results for input(s): TSH, T4TOTAL, FREET4, T3FREE, THYROIDAB in the last 72 hours. Anemia Panel: No results for input(s): VITAMINB12, FOLATE, FERRITIN, TIBC, IRON, RETICCTPCT in the last 72 hours. Sepsis Labs: Recent Labs  Lab 05/14/19 1802 05/15/19 0253  LATICACIDVEN 0.8 1.2    Recent Results (from the past  240 hour(s))  SARS Coronavirus 2 (CEPHEID- Performed in [facility] hospital lab), Hosp Order     Status: None   Collection Time: 05/14/19  6:22 PM  Result Value Ref Range Status   SARS Coronavirus 2 NEGATIVE NEGATIVE Final    Comment: (NOTE) If result is NEGATIVE SARS-CoV-2 target nucleic acids are NOT DETECTED. The SARS-CoV-2 RNA is generally detectable in upper and lower  respiratory specimens during the acute phase of infection. The lowest  concentration of SARS-CoV-2 viral copies this assay can detect is 250  copies / mL. A negative result does not preclude SARS-CoV-2 infection  and should not be used as the sole basis for treatment or other  patient management decisions.  A negative result may occur with  improper specimen collection / handling, submission of specimen other  than nasopharyngeal swab, presence of viral mutation(s) within the  areas targeted by this assay, and inadequate number of viral copies  (<250 copies / mL). A negative result must be combined with clinical  observations, patient history, and epidemiological information. If result is POSITIVE SARS-CoV-2 target nucleic acids are DETECTED. The SARS-CoV-2 RNA is generally detectable in upper and lower  respiratory specimens dur ing the acute phase of infection.  Positive  results are indicative of active infection with SARS-CoV-2.  Clinical  correlation with patient history and other diagnostic information is  necessary to determine patient infection status.  Positive results do  not rule out bacterial infection or co-infection with other viruses. If result is PRESUMPTIVE POSTIVE SARS-CoV-2 nucleic acids MAY BE PRESENT.   A presumptive positive result was obtained on the submitted specimen  and confirmed on repeat testing.  While 2019 novel coronavirus  (SARS-CoV-2) nucleic acids may be present in the submitted sample  additional confirmatory testing may be necessary for epidemiological  and / or clinical  management purposes  to differentiate between  SARS-CoV-2 and other Sarbecovirus currently known to infect humans.  If clinically indicated additional testing with an alternate test  methodology [phone]) is advised. The SARS-CoV-2 RNA is generally  detectable in upper and lower respiratory sp ecimens during the acute  phase of infection. The expected result is Negative. Fact Sheet for Patients:  StrictlyIdeas.no Fact Sheet for Healthcare Providers: BankingDealers.co.za This test is not yet approved or cleared by the [place] FDA and has been authorized for detection and/or diagnosis of SARS-CoV-2 by FDA under an Emergency Use Authorization (EUA).  This EUA will remain in effect (meaning this test can be used) for  the duration of the COVID-19 declaration under Section 564(b)(1) of the Act, 21 U.S.C. section 360bbb-3(b)(1), unless the authorization is terminated or revoked sooner. Performed at [facility], [place] [address], [place], [place] [address]   MRSA PCR Screening     Status: Abnormal   Collection Time: 05/14/19 11:54 PM  Result Value Ref Range Status   MRSA by PCR POSITIVE (A) NEGATIVE Final    Comment:        The GeneXpert MRSA Assay (FDA approved for NASAL specimens only), is one component of a comprehensive MRSA colonization surveillance program. It is not intended to diagnose MRSA infection nor to guide or monitor treatment for MRSA infections. RESULT CALLED TO, READ BACK BY AND VERIFIED WITH: LANELY,S RN @0303  ON 05/15/2019 JACKSON,K Performed at [facility], [place] [address], [place], [place] [address]   Respiratory Panel by PCR     Status: None   Collection Time: 05/15/19  2:35 AM  Result Value Ref Range Status   Adenovirus NOT DETECTED NOT DETECTED Final   Coronavirus 229E NOT DETECTED NOT DETECTED Final    Comment: (NOTE) The Coronavirus on the Respiratory Panel, DOES NOT  test for the novel  Coronavirus (2019 nCoV)    Coronavirus HKU1 NOT DETECTED NOT DETECTED Final   Coronavirus NL63 NOT DETECTED NOT DETECTED Final   Coronavirus OC43 NOT DETECTED NOT DETECTED Final   Metapneumovirus NOT DETECTED NOT DETECTED Final   Rhinovirus / Enterovirus NOT DETECTED NOT DETECTED Final   Influenza A NOT DETECTED NOT DETECTED Final   Influenza B NOT DETECTED NOT DETECTED Final   Parainfluenza Virus 1 NOT DETECTED NOT DETECTED Final   Parainfluenza Virus 2 NOT DETECTED NOT DETECTED Final   Parainfluenza Virus 3 NOT DETECTED NOT DETECTED Final   Parainfluenza Virus 4 NOT DETECTED NOT DETECTED Final   Respiratory Syncytial Virus NOT DETECTED NOT DETECTED Final   Bordetella pertussis NOT DETECTED NOT DETECTED Final   Chlamydophila pneumoniae NOT DETECTED NOT DETECTED Final   Mycoplasma pneumoniae NOT DETECTED NOT DETECTED Final    Comment: Performed at [facility], [place]. [address], [place], [place] [address]  SARS Coronavirus 2 [facility] order, Performed in [facility] hospital lab)     Status: None   Collection Time: 05/15/19  9:03 AM  Result Value Ref Range Status   SARS Coronavirus 2 NEGATIVE NEGATIVE Final    Comment: (NOTE) If result is NEGATIVE SARS-CoV-2 target nucleic acids are NOT DETECTED. The SARS-CoV-2 RNA is generally detectable in upper and lower  respiratory specimens during the acute phase of infection. The lowest  concentration of SARS-CoV-2 viral copies this assay can detect is 250  copies / mL. A negative result does not preclude SARS-CoV-2 infection  and should not be used as the sole basis for treatment or other  patient management decisions.  A negative result may occur with  improper specimen collection / handling, submission of specimen other  than nasopharyngeal swab, presence of viral mutation(s) within the  areas targeted by this assay, and inadequate number of viral copies  (<250 copies / mL). A negative result must be combined with  clinical  observations, patient history, and epidemiological information. If result is POSITIVE SARS-CoV-2 target nucleic acids are DETECTED. The SARS-CoV-2 RNA is generally detectable in upper and lower  respiratory specimens dur ing the acute phase of infection.  Positive  results are indicative of active infection with SARS-CoV-2.  Clinical  correlation with patient history and other diagnostic information  is  necessary to determine patient infection status.  Positive results do  not rule out bacterial infection or co-infection with other viruses. If result is PRESUMPTIVE POSTIVE SARS-CoV-2 nucleic acids MAY BE PRESENT.   A presumptive positive result was obtained on the submitted specimen  and confirmed on repeat testing.  While 2019 novel coronavirus  (SARS-CoV-2) nucleic acids may be present in the submitted sample  additional confirmatory testing may be necessary for epidemiological  and / or clinical management purposes  to differentiate between  SARS-CoV-2 and other Sarbecovirus currently known to infect humans.  If clinically indicated additional testing with an alternate test  methodology [phone]) is advised. The SARS-CoV-2 RNA is generally  detectable in upper and lower respiratory sp ecimens during the acute  phase of infection. The expected result is Negative. Fact Sheet for Patients:  StrictlyIdeas.no Fact Sheet for Healthcare Providers: BankingDealers.co.za This test is not yet approved or cleared by the [place] FDA and has been authorized for detection and/or diagnosis of SARS-CoV-2 by FDA under an Emergency Use Authorization (EUA).  This EUA will remain in effect (meaning this test can be used) for the duration of the COVID-19 declaration under Section 564(b)(1) of the Act, 21 U.S.C. section 360bbb-3(b)(1), unless the authorization is terminated or revoked sooner. Performed at [facility],  [place] [address] [place], [place] [address]        Radiology Studies: No results found.    Scheduled Meds: . Chlorhexidine Gluconate Cloth  6 each Topical Daily  . cholecalciferol  1,000 Units Oral Daily  . enoxaparin (LOVENOX) injection  60 mg Subcutaneous Daily  . fluticasone  1 spray Each Nare Daily  . fluticasone furoate-vilanterol  1 puff Inhalation Daily  . insulin aspart  0-20 Units Subcutaneous TID WC  . lamoTRIgine  25 mg Oral Daily  . loratadine  10 mg Oral Daily  . mouth rinse  15 mL Mouth Rinse q12n4p  . methylPREDNISolone (SOLU-MEDROL) injection  40 mg Intravenous Q12H  . metoprolol succinate  12.5 mg Oral Daily  . multivitamin  15 mL Oral Daily  . omega-3 acid ethyl esters  1 g Oral Daily  . pantoprazole  40 mg Oral Daily  . pravastatin  40 mg Oral QHS  . senna  2 tablet Oral QHS  . venlafaxine XR  300 mg Oral Q breakfast   And  . venlafaxine XR  37.5 mg Oral Q breakfast  . vitamin C  3,000 mg Oral Daily   Continuous Infusions:   LOS: 6 days    Time spent: 25 minutes   Dessa Phi, DO [facility] [email or web address] 05/20/2019, 11:31 AM

## 2019-05-21 LAB — BASIC METABOLIC PANEL
Anion gap: 10 (ref 5–15)
BUN: 22 mg/dL — ABNORMAL HIGH (ref 6–20)
CO2: 28 mmol/L (ref 22–32)
Calcium: 9.3 mg/dL (ref 8.9–10.3)
Chloride: 100 mmol/L (ref 98–111)
Creatinine, Ser: 0.77 mg/dL (ref 0.44–1.00)
GFR calc Af Amer: >60 mL/min (ref >60)
GFR calc non Af Amer: >60 mL/min (ref >60)
Glucose, Bld: 239 mg/dL — ABNORMAL HIGH (ref 70–99)
Potassium: 4.6 mmol/L (ref 3.5–5.1)
Sodium: 138 mmol/L (ref 135–145)

## 2019-05-21 LAB — GLUCOSE, CAPILLARY
Glucose-Capillary: 170 mg/dL — ABNORMAL HIGH (ref 70–99)
Glucose-Capillary: 193 mg/dL — ABNORMAL HIGH (ref 70–99)
Glucose-Capillary: 229 mg/dL — ABNORMAL HIGH (ref 70–99)
Glucose-Capillary: 180 mg/dL — ABNORMAL HIGH (ref 70–99)

## 2019-05-21 MED ORDER — PREDNISONE 50 MG PO TABS
50.0000 mg | ORAL_TABLET | Freq: Every day | ORAL | Status: DC
  Administered 2019-05-22 – 2019-05-23 (×2): 50 mg via ORAL
  Filled 2019-05-21: qty 1
  Filled 2019-05-21: qty 2

## 2019-05-21 NOTE — Progress Notes (Signed)
Entered room to place CPAP on patient as ordered.  Patient refused and stated that she wears it to much and for to long.  She refuses to wear tonight and will not entertain the idea that she needs to wear device every night.  RN, Rip Harbour, aware of conversation with patient.

## 2019-05-21 NOTE — Progress Notes (Signed)
PROGRESS NOTE    Kathleen Roy  VOZ:[id number] DOB: 1961/03/05 DOA: 05/14/2019 PCP: [name], MD     Brief Narrative:  Kathleen Roy a 58 y.o.femalewith medical history significant ofCOPD,chronic respiratory failure on 4 L nasal cannula oxygen,HTN, HLD, diet-controlled DM, dCHF,morbid obesity, OSA, hypoventilation syndrome, migraine headache, fibromyalgia, memory loss, who presents withSOB andoxygen desaturation. Per report, pt was found to haveoxygen desaturation to low 80s on baseline 4 L nasal cannula oxygen in the facility.Ptisplaced on NRB at 15 L O2- 95%. She has hx ofmemory loss. She is mildly confused. She response to calling her name, but is not oriented to place and time.She does not complain of any pain.Does not seem to have chest pain or abdominal pain. No active cough, nausea,vomiting, diarrhea noted. Not sure if pt hassymptoms of UTI. She moves all extremities on painful stimuli. No facial droop.  ED Course: Pt was found to have BNP 36.9, negative COVID-19 test, negative trop,renal function normal, temperature normal, heart rate in 90s, RR 25, oxygen 95% on BiPAP.VBG withpH7.301, CO2 118, O2 49.1. Patient is admitted to stepdown bed as inpatient.  Interim: Patient has been treated for acute on chronic respiratory failure with hypoxia and hypercapnia, with concern for probable acute COPD exacerbation and acute on chronic diastolic heart failure.  PCCM was consulted.  New events last 24 hours / Subjective: On 7L HFNC O2 this morning. No new complaints other than sore feet. She is bedbound at baseline. Ate breakfast. No worsening SOB.   Assessment & Plan:   Principal Problem:   Acute on chronic respiratory failure with hypoxia and hypercapnia (HCC) Active Problems:   Major depressive disorder, recurrent episode (HCC)   COPD (chronic obstructive pulmonary disease) (HCC)   Hypokalemia   OSA (obstructive sleep apnea)   Obesity hypoventilation  syndrome (HCC)   Chronic diastolic congestive heart failure (HCC)   Essential hypertension   Diabetes mellitus without complication (HCC)   Macrocytic anemia   Acute metabolic encephalopathy   Acute on chronic diastolic congestive heart failure (HCC)   Hypernatremia   Acute on chronic respiratory failure with hypoxia and hypercapnia -Likely multifactorial secondary to probable acute COPD exacerbation, acute on chronic diastolic CHF exacerbation in the setting of OSA and hypoventilation syndrome -SARS-CoV2 negative x2 -Respiratory viral panel negative -CXR: Diffuse lung density, similar to previous studies but possibly slightly more pronounced. -CTA chest: Dependent bilateral lower lobe airspace disease could be due to pneumonia, atelectasis or aspiration. Cardiomegaly and findings compatible with mild interstitial pulmonary edema. Small pleural effusions noted. -PCCM consulted, signed off 6/1 -Continue to wean O2 to baseline 4L   Acute COPD exacerbation -Continue IV steroid taper - will change to PO starting tomorrow AM, scheduled nebs. Completed 5 day levaquin   Acute on chronic diastolic CHF -Echo EF of 60 to 65% with grade 2 diastolic dysfunction -Patient was on torsemide twice daily.  Patient was on Lasix 80 mg IV every 12 hours.  Patient with a worsening hypernatremia and a contraction alkalosis and as such IV diuretics have been held -Strict I's and O's.  Daily weights.   -Appears to be stable currently   Major depressive disorder, recurrent -Continue Effexor, lamictal   Acute metabolic encephalopathy and history of memory loss -Likely secondary to problem #1. Stable     Obstructive sleep apnea/obesity hypoventilation syndrome -On BiPAP nightly and PRN per pulmonary recommendations  Hypertension -Continue Toprol-XL   Well-controlled diabetes mellitus type 2 -Hemoglobin A1c 5.5. SSI   HLD -Continue pravachol  DVT prophylaxis: Lovenox Code Status: DNR  Family Communication: None Disposition Plan: Continue to wean O2 as able, discharged back to [facility] once stable and back to baseline O2 needs    Consultants:   PCCM  Procedures:   None  Antimicrobials:  Anti-infectives (From admission, onward)   Start     Dose/Rate Route Frequency Ordered Stop   05/18/19 1100  levofloxacin (LEVAQUIN) tablet 750 mg  Status:  Discontinued     750 mg Oral Daily 05/18/19 1017 05/19/19 1249   05/15/19 1800  levofloxacin (LEVAQUIN) IVPB 500 mg  Status:  Discontinued     500 mg 100 mL/hr over 60 Minutes Intravenous Every 24 hours 05/15/19 1607 05/15/19 1622   05/15/19 1800  levofloxacin (LEVAQUIN) IVPB 750 mg  Status:  Discontinued     750 mg 100 mL/hr over 90 Minutes Intravenous Every 24 hours 05/15/19 1622 05/18/19 1017       Objective: Vitals:   05/21/19 0400 05/21/19 0425 05/21/19 0751 05/21/19 0800  BP:      Pulse:      Resp:      Temp: 97.6 F (36.4 C)   (!) 97.5 F (36.4 C)  TempSrc: Axillary   Oral  SpO2:   100%   Weight:  (!) 144.7 kg    Height:        Intake/Output Summary (Last 24 hours) at 05/21/2019 1121 Last data filed at 05/21/2019 0500 Gross per 24 hour  Intake -  Output 1250 ml  Net -1250 ml   Filed Weights   05/19/19 0500 05/20/19 0500 05/21/19 0425  Weight: (!) 145.7 kg (!) 145.8 kg (!) 144.7 kg    Examination: General exam: Appears calm and comfortable  Respiratory system: Diminished breath sounds bilaterally, on 7L HFNC O2  Cardiovascular system: S1 & S2 heard, RRR. No JVD, murmurs, rubs, gallops or clicks. Trace pedal edema. Gastrointestinal system: Abdomen is nondistended, soft and nontender. No organomegaly or masses felt. Normal bowel sounds heard. Central nervous system: Alert and oriented. No focal neurological deficits. Extremities: Symmetric  Skin: No rashes, lesions or ulcers on exposed skin  Psychiatry: Judgement and insight appear normal. Mood & affect appropriate.   Data Reviewed: I have  personally reviewed following labs and imaging studies  CBC: Recent Labs  Lab 05/15/19 1001 05/16/19 0512 05/17/19 0316 05/18/19 0338 05/19/19 0258  WBC 6.1 5.7 4.8 4.6 5.7  NEUTROABS 4.6 4.9 3.6 3.5 3.9  HGB 8.9* 9.1* 8.8* 8.7* 9.0*  HCT 32.2* 31.4* 31.1* 31.1* 30.9*  MCV 105.2* 102.6* 104.0* 103.0* 101.0*  PLT 170 213 189 182 086   Basic Metabolic Panel: Recent Labs  Lab 05/15/19 1001  05/17/19 0316 05/17/19 1517 05/18/19 0338 05/19/19 0258 05/20/19 0347 05/21/19 0338  NA 147*   < > 145 145 143 140 139 138  K 3.0*   < > 2.7* 3.0* 3.2* 3.6 5.0 4.6  CL 85*   < > 91* 94* 95* 97* 98 100  CO2 49*   < > 41* 39* 36* 35* 33* 28  GLUCOSE 141*   < > 144* 239* 170* 169* 222* 239*  BUN 15   < > 22* 26* 22* 21* 23* 22*  CREATININE 0.74   < > 0.93 0.90 0.80 0.73 0.84 0.77  CALCIUM 9.1   < > 9.8 10.0 9.9 9.5 9.6 9.3  MG 2.0  --  2.1  --  2.0  --   --   --    < > = values  in this interval not displayed.   GFR: Estimated Creatinine Clearance: 107.7 mL/min (by C-G formula based on SCr of 0.77 mg/dL). Liver Function Tests: Recent Labs  Lab 05/14/19 1802  AST 17  ALT 16  ALKPHOS 87  BILITOT 0.5  PROT 7.3  ALBUMIN 3.4*   No results for input(s): LIPASE, AMYLASE in the last 168 hours. No results for input(s): AMMONIA in the last 168 hours. Coagulation Profile: No results for input(s): INR, PROTIME in the last 168 hours. Cardiac Enzymes: Recent Labs  Lab 05/14/19 1802 05/16/19 0221  TROPONINI <0.03 <0.03   BNP (last 3 results) No results for input(s): PROBNP in the last 8760 hours. HbA1C: No results for input(s): HGBA1C in the last 72 hours. CBG: Recent Labs  Lab 05/20/19 0755 05/20/19 1157 05/20/19 1652 05/20/19 2154 05/21/19 0804  GLUCAP 166* 189* 100* 130* 170*   Lipid Profile: No results for input(s): CHOL, HDL, LDLCALC, TRIG, CHOLHDL, LDLDIRECT in the last 72 hours. Thyroid Function Tests: No results for input(s): TSH, T4TOTAL, FREET4, T3FREE, THYROIDAB  in the last 72 hours. Anemia Panel: No results for input(s): VITAMINB12, FOLATE, FERRITIN, TIBC, IRON, RETICCTPCT in the last 72 hours. Sepsis Labs: Recent Labs  Lab 05/14/19 1802 05/15/19 0253  LATICACIDVEN 0.8 1.2    Recent Results (from the past 240 hour(s))  SARS Coronavirus 2 (CEPHEID- Performed in [facility] hospital lab), Hosp Order     Status: None   Collection Time: 05/14/19  6:22 PM  Result Value Ref Range Status   SARS Coronavirus 2 NEGATIVE NEGATIVE Final    Comment: (NOTE) If result is NEGATIVE SARS-CoV-2 target nucleic acids are NOT DETECTED. The SARS-CoV-2 RNA is generally detectable in upper and lower  respiratory specimens during the acute phase of infection. The lowest  concentration of SARS-CoV-2 viral copies this assay can detect is 250  copies / mL. A negative result does not preclude SARS-CoV-2 infection  and should not be used as the sole basis for treatment or other  patient management decisions.  A negative result may occur with  improper specimen collection / handling, submission of specimen other  than nasopharyngeal swab, presence of viral mutation(s) within the  areas targeted by this assay, and inadequate number of viral copies  (<250 copies / mL). A negative result must be combined with clinical  observations, patient history, and epidemiological information. If result is POSITIVE SARS-CoV-2 target nucleic acids are DETECTED. The SARS-CoV-2 RNA is generally detectable in upper and lower  respiratory specimens dur ing the acute phase of infection.  Positive  results are indicative of active infection with SARS-CoV-2.  Clinical  correlation with patient history and other diagnostic information is  necessary to determine patient infection status.  Positive results do  not rule out bacterial infection or co-infection with other viruses. If result is PRESUMPTIVE POSTIVE SARS-CoV-2 nucleic acids MAY BE PRESENT.   A presumptive positive result was  obtained on the submitted specimen  and confirmed on repeat testing.  While 2019 novel coronavirus  (SARS-CoV-2) nucleic acids may be present in the submitted sample  additional confirmatory testing may be necessary for epidemiological  and / or clinical management purposes  to differentiate between  SARS-CoV-2 and other Sarbecovirus currently known to infect humans.  If clinically indicated additional testing with an alternate test  methodology [phone]) is advised. The SARS-CoV-2 RNA is generally  detectable in upper and lower respiratory sp ecimens during the acute  phase of infection. The expected result is Negative. Fact Sheet for  Patients:  StrictlyIdeas.no Fact Sheet for Healthcare Providers: BankingDealers.co.za This test is not yet approved or cleared by the [place] FDA and has been authorized for detection and/or diagnosis of SARS-CoV-2 by FDA under an Emergency Use Authorization (EUA).  This EUA will remain in effect (meaning this test can be used) for the duration of the COVID-19 declaration under Section 564(b)(1) of the Act, 21 U.S.C. section 360bbb-3(b)(1), unless the authorization is terminated or revoked sooner. Performed at [facility], [place] [address], [place], [place] [address]   MRSA PCR Screening     Status: Abnormal   Collection Time: 05/14/19 11:54 PM  Result Value Ref Range Status   MRSA by PCR POSITIVE (A) NEGATIVE Final    Comment:        The GeneXpert MRSA Assay (FDA approved for NASAL specimens only), is one component of a comprehensive MRSA colonization surveillance program. It is not intended to diagnose MRSA infection nor to guide or monitor treatment for MRSA infections. RESULT CALLED TO, READ BACK BY AND VERIFIED WITH: LANELY,S RN @0303  ON 05/15/2019 JACKSON,K Performed at [facility], [place] [address] [place], [place] [address]   Respiratory Panel by  PCR     Status: None   Collection Time: 05/15/19  2:35 AM  Result Value Ref Range Status   Adenovirus NOT DETECTED NOT DETECTED Final   Coronavirus 229E NOT DETECTED NOT DETECTED Final    Comment: (NOTE) The Coronavirus on the Respiratory Panel, DOES NOT test for the novel  Coronavirus (2019 nCoV)    Coronavirus HKU1 NOT DETECTED NOT DETECTED Final   Coronavirus NL63 NOT DETECTED NOT DETECTED Final   Coronavirus OC43 NOT DETECTED NOT DETECTED Final   Metapneumovirus NOT DETECTED NOT DETECTED Final   Rhinovirus / Enterovirus NOT DETECTED NOT DETECTED Final   Influenza A NOT DETECTED NOT DETECTED Final   Influenza B NOT DETECTED NOT DETECTED Final   Parainfluenza Virus 1 NOT DETECTED NOT DETECTED Final   Parainfluenza Virus 2 NOT DETECTED NOT DETECTED Final   Parainfluenza Virus 3 NOT DETECTED NOT DETECTED Final   Parainfluenza Virus 4 NOT DETECTED NOT DETECTED Final   Respiratory Syncytial Virus NOT DETECTED NOT DETECTED Final   Bordetella pertussis NOT DETECTED NOT DETECTED Final   Chlamydophila pneumoniae NOT DETECTED NOT DETECTED Final   Mycoplasma pneumoniae NOT DETECTED NOT DETECTED Final    Comment: Performed at [facility], [place]. [address] [place], [place] [address]  SARS Coronavirus 2 [facility] order, Performed in [facility] hospital lab)     Status: None   Collection Time: 05/15/19  9:03 AM  Result Value Ref Range Status   SARS Coronavirus 2 NEGATIVE NEGATIVE Final    Comment: (NOTE) If result is NEGATIVE SARS-CoV-2 target nucleic acids are NOT DETECTED. The SARS-CoV-2 RNA is generally detectable in upper and lower  respiratory specimens during the acute phase of infection. The lowest  concentration of SARS-CoV-2 viral copies this assay can detect is 250  copies / mL. A negative result does not preclude SARS-CoV-2 infection  and should not be used as the sole basis for treatment or other  patient management decisions.  A negative result may occur with  improper  specimen collection / handling, submission of specimen other  than nasopharyngeal swab, presence of viral mutation(s) within the  areas targeted by this assay, and inadequate number of viral copies  (<250 copies / mL). A negative result must be combined with clinical  observations, patient history, and  epidemiological information. If result is POSITIVE SARS-CoV-2 target nucleic acids are DETECTED. The SARS-CoV-2 RNA is generally detectable in upper and lower  respiratory specimens dur ing the acute phase of infection.  Positive  results are indicative of active infection with SARS-CoV-2.  Clinical  correlation with patient history and other diagnostic information is  necessary to determine patient infection status.  Positive results do  not rule out bacterial infection or co-infection with other viruses. If result is PRESUMPTIVE POSTIVE SARS-CoV-2 nucleic acids MAY BE PRESENT.   A presumptive positive result was obtained on the submitted specimen  and confirmed on repeat testing.  While 2019 novel coronavirus  (SARS-CoV-2) nucleic acids may be present in the submitted sample  additional confirmatory testing may be necessary for epidemiological  and / or clinical management purposes  to differentiate between  SARS-CoV-2 and other Sarbecovirus currently known to infect humans.  If clinically indicated additional testing with an alternate test  methodology [phone]) is advised. The SARS-CoV-2 RNA is generally  detectable in upper and lower respiratory sp ecimens during the acute  phase of infection. The expected result is Negative. Fact Sheet for Patients:  StrictlyIdeas.no Fact Sheet for Healthcare Providers: BankingDealers.co.za This test is not yet approved or cleared by the [place] FDA and has been authorized for detection and/or diagnosis of SARS-CoV-2 by FDA under an Emergency Use Authorization (EUA).  This EUA will remain in  effect (meaning this test can be used) for the duration of the COVID-19 declaration under Section 564(b)(1) of the Act, 21 U.S.C. section 360bbb-3(b)(1), unless the authorization is terminated or revoked sooner. Performed at [facility], [place] [address] [place], [place] [address]        Radiology Studies: No results found.    Scheduled Meds: . Chlorhexidine Gluconate Cloth  6 each Topical Daily  . cholecalciferol  1,000 Units Oral Daily  . enoxaparin (LOVENOX) injection  60 mg Subcutaneous Daily  . fluticasone  1 spray Each Nare Daily  . fluticasone furoate-vilanterol  1 puff Inhalation Daily  . insulin aspart  0-20 Units Subcutaneous TID WC  . lamoTRIgine  25 mg Oral Daily  . loratadine  10 mg Oral Daily  . mouth rinse  15 mL Mouth Rinse q12n4p  . methylPREDNISolone (SOLU-MEDROL) injection  40 mg Intravenous Q12H  . metoprolol succinate  12.5 mg Oral Daily  . multivitamin  15 mL Oral Daily  . omega-3 acid ethyl esters  1 g Oral Daily  . pantoprazole  40 mg Oral Daily  . pravastatin  40 mg Oral QHS  . senna  2 tablet Oral QHS  . venlafaxine XR  300 mg Oral Q breakfast   And  . venlafaxine XR  37.5 mg Oral Q breakfast  . vitamin C  3,000 mg Oral Daily   Continuous Infusions:   LOS: 7 days    Time spent: 25 minutes   Dessa Phi, DO [facility] [email or web address] 05/21/2019, 11:21 AM

## 2019-05-22 LAB — BASIC METABOLIC PANEL
Anion gap: 9 (ref 5–15)
BUN: 23 mg/dL — ABNORMAL HIGH (ref 6–20)
CO2: 30 mmol/L (ref 22–32)
Calcium: 9.5 mg/dL (ref 8.9–10.3)
Chloride: 101 mmol/L (ref 98–111)
Creatinine, Ser: 0.82 mg/dL (ref 0.44–1.00)
GFR calc Af Amer: >60 mL/min (ref >60)
GFR calc non Af Amer: >60 mL/min (ref >60)
Glucose, Bld: 146 mg/dL — ABNORMAL HIGH (ref 70–99)
Potassium: 4.3 mmol/L (ref 3.5–5.1)
Sodium: 140 mmol/L (ref 135–145)

## 2019-05-22 LAB — GLUCOSE, CAPILLARY
Glucose-Capillary: 115 mg/dL — ABNORMAL HIGH (ref 70–99)
Glucose-Capillary: 217 mg/dL — ABNORMAL HIGH (ref 70–99)
Glucose-Capillary: 245 mg/dL — ABNORMAL HIGH (ref 70–99)
Glucose-Capillary: 217 mg/dL — ABNORMAL HIGH (ref 70–99)
Glucose-Capillary: 213 mg/dL — ABNORMAL HIGH (ref 70–99)

## 2019-05-22 LAB — SARS CORONAVIRUS 2 BY RT PCR (HOSPITAL ORDER, PERFORMED IN ~~LOC~~ HOSPITAL LAB): SARS Coronavirus 2: NEGATIVE

## 2019-05-22 MED ORDER — NYSTATIN 100000 UNIT/GM EX POWD
Freq: Three times a day (TID) | CUTANEOUS | Status: DC
  Administered 2019-05-22 – 2019-05-23 (×4): via TOPICAL
  Filled 2019-05-22: qty 15

## 2019-05-22 NOTE — Progress Notes (Signed)
PROGRESS NOTE    Kathleen Roy  OZH:[id number] DOB: 1961/11/17 DOA: 05/14/2019 PCP: [name], MD     Brief Narrative:  Kathleen Roy a 58 y.o.femalewith medical history significant ofCOPD,chronic respiratory failure on 4 L nasal cannula oxygen,HTN, HLD, diet-controlled DM, dCHF,morbid obesity, OSA, hypoventilation syndrome, migraine headache, fibromyalgia, memory loss, who presents withSOB andoxygen desaturation. Per report, pt was found to haveoxygen desaturation to low 80s on baseline 4 L nasal cannula oxygen in the facility.Ptisplaced on NRB at 15 L O2- 95%. She has hx ofmemory loss. She is mildly confused. She response to calling her name, but is not oriented to place and time.She does not complain of any pain.Does not seem to have chest pain or abdominal pain. No active cough, nausea,vomiting, diarrhea noted. Not sure if pt hassymptoms of UTI. She moves all extremities on painful stimuli. No facial droop.  ED Course: Pt was found to have BNP 36.9, negative COVID-19 test, negative trop,renal function normal, temperature normal, heart rate in 90s, RR 25, oxygen 95% on BiPAP.VBG withpH7.301, CO2 118, O2 49.1. Patient is admitted to stepdown bed as inpatient.  Interim: Patient has been treated for acute on chronic respiratory failure with hypoxia and hypercapnia, with concern for probable acute COPD exacerbation and acute on chronic diastolic heart failure.  PCCM was consulted.  New events last 24 hours / Subjective: On BiPAP this morning, no new issues overnight.   Spoke with RN, patient now on 4L [place] O2   Assessment & Plan:   Principal Problem:   Acute on chronic respiratory failure with hypoxia and hypercapnia (HCC) Active Problems:   Major depressive disorder, recurrent episode (HCC)   COPD (chronic obstructive pulmonary disease) (HCC)   Hypokalemia   OSA (obstructive sleep apnea)   Obesity hypoventilation syndrome (HCC)   Chronic diastolic  congestive heart failure (HCC)   Essential hypertension   Diabetes mellitus without complication (HCC)   Macrocytic anemia   Acute metabolic encephalopathy   Acute on chronic diastolic congestive heart failure (HCC)   Hypernatremia   Acute on chronic respiratory failure with hypoxia and hypercapnia -Likely multifactorial secondary to probable acute COPD exacerbation, acute on chronic diastolic CHF exacerbation in the setting of OSA and hypoventilation syndrome -SARS-CoV2 negative x2 -Respiratory viral panel negative -CXR: Diffuse lung density, similar to previous studies but possibly slightly more pronounced. -CTA chest: Dependent bilateral lower lobe airspace disease could be due to pneumonia, atelectasis or aspiration. Cardiomegaly and findings compatible with mild interstitial pulmonary edema. Small pleural effusions noted. -PCCM consulted, signed off 6/1 -Continue to wean O2 to baseline 4L   Acute COPD exacerbation -Now on PO prednisone, scheduled nebs. Completed 5 day levaquin   Acute on chronic diastolic CHF -Echo EF of 60 to 65% with grade 2 diastolic dysfunction -Patient was on torsemide twice daily.  Patient was on Lasix 80 mg IV every 12 hours.  Patient with a worsening hypernatremia and a contraction alkalosis and as such IV diuretics have been held -Strict I's and O's.  Daily weights.   -Appears to be stable currently   Major depressive disorder, recurrent -Continue Effexor, lamictal   Acute metabolic encephalopathy and history of memory loss -Likely secondary to problem #1. Stable     Obstructive sleep apnea/obesity hypoventilation syndrome -On BiPAP qhs and with naps   Hypertension -Continue Toprol-XL   Well-controlled diabetes mellitus type 2 -Hemoglobin A1c 5.5. SSI   HLD -Continue pravachol    DVT prophylaxis: Lovenox Code Status: DNR Family Communication: None Disposition Plan:  Discharge back to [facility] 6/7 as long as no acute events  overnight   Consultants:   PCCM  Procedures:   None  Antimicrobials:  Anti-infectives (From admission, onward)   Start     Dose/Rate Route Frequency Ordered Stop   05/18/19 1100  levofloxacin (LEVAQUIN) tablet 750 mg  Status:  Discontinued     750 mg Oral Daily 05/18/19 1017 05/19/19 1249   05/15/19 1800  levofloxacin (LEVAQUIN) IVPB 500 mg  Status:  Discontinued     500 mg 100 mL/hr over 60 Minutes Intravenous Every 24 hours 05/15/19 1607 05/15/19 1622   05/15/19 1800  levofloxacin (LEVAQUIN) IVPB 750 mg  Status:  Discontinued     750 mg 100 mL/hr over 90 Minutes Intravenous Every 24 hours 05/15/19 1622 05/18/19 1017       Objective: Vitals:   05/22/19 0400 05/22/19 0600 05/22/19 0800 05/22/19 0843  BP: 122/62 131/71 131/71   Pulse: 84 83 74   Resp: (!) 23 15 (!) 22   Temp: (!) 97.4 F (36.3 C)   98.3 F (36.8 C)  TempSrc: Axillary   Axillary  SpO2: 98% 100% 97%   Weight:      Height:        Intake/Output Summary (Last 24 hours) at 05/22/2019 1039 Last data filed at 05/22/2019 0600 Gross per 24 hour  Intake 320 ml  Output 900 ml  Net -580 ml   Filed Weights   05/19/19 0500 05/20/19 0500 05/21/19 0425  Weight: (!) 145.7 kg (!) 145.8 kg (!) 144.7 kg    Examination: General exam: Appears calm and comfortable  Respiratory system: Diminished breath sounds, on BiPAP, no respiratory distress Cardiovascular system: S1 & S2 heard, RRR. No JVD, murmurs, rubs, gallops or clicks. No pedal edema. Gastrointestinal system: Abdomen is nondistended, soft and nontender. No organomegaly or masses felt. Normal bowel sounds heard. Central nervous system: Alert and oriented. No focal neurological deficits. Extremities: Symmetric 5 x 5 power. Skin: No rashes, lesions or ulcers Psychiatry: Judgement and insight appear normal. Mood & affect appropriate.    Data Reviewed: I have personally reviewed following labs and imaging studies  CBC: Recent Labs  Lab 05/16/19 0512  05/17/19 0316 05/18/19 0338 05/19/19 0258  WBC 5.7 4.8 4.6 5.7  NEUTROABS 4.9 3.6 3.5 3.9  HGB 9.1* 8.8* 8.7* 9.0*  HCT 31.4* 31.1* 31.1* 30.9*  MCV 102.6* 104.0* 103.0* 101.0*  PLT 213 189 182 979   Basic Metabolic Panel: Recent Labs  Lab 05/17/19 0316  05/18/19 0338 05/19/19 0258 05/20/19 0347 05/21/19 0338 05/22/19 0217  NA 145   < > 143 140 139 138 140  K 2.7*   < > 3.2* 3.6 5.0 4.6 4.3  CL 91*   < > 95* 97* 98 100 101  CO2 41*   < > 36* 35* 33* 28 30  GLUCOSE 144*   < > 170* 169* 222* 239* 146*  BUN 22*   < > 22* 21* 23* 22* 23*  CREATININE 0.93   < > 0.80 0.73 0.84 0.77 0.82  CALCIUM 9.8   < > 9.9 9.5 9.6 9.3 9.5  MG 2.1  --  2.0  --   --   --   --    < > = values in this interval not displayed.   GFR: Estimated Creatinine Clearance: 105 mL/min (by C-G formula based on SCr of 0.82 mg/dL). Liver Function Tests: No results for input(s): AST, ALT, ALKPHOS, BILITOT, PROT, ALBUMIN in the  last 168 hours. No results for input(s): LIPASE, AMYLASE in the last 168 hours. No results for input(s): AMMONIA in the last 168 hours. Coagulation Profile: No results for input(s): INR, PROTIME in the last 168 hours. Cardiac Enzymes: Recent Labs  Lab 05/16/19 0221  TROPONINI <0.03   BNP (last 3 results) No results for input(s): PROBNP in the last 8760 hours. HbA1C: No results for input(s): HGBA1C in the last 72 hours. CBG: Recent Labs  Lab 05/21/19 0804 05/21/19 1120 05/21/19 1637 05/21/19 2212 05/22/19 0834  GLUCAP 170* 193* 229* 180* 115*   Lipid Profile: No results for input(s): CHOL, HDL, LDLCALC, TRIG, CHOLHDL, LDLDIRECT in the last 72 hours. Thyroid Function Tests: No results for input(s): TSH, T4TOTAL, FREET4, T3FREE, THYROIDAB in the last 72 hours. Anemia Panel: No results for input(s): VITAMINB12, FOLATE, FERRITIN, TIBC, IRON, RETICCTPCT in the last 72 hours. Sepsis Labs: No results for input(s): PROCALCITON, LATICACIDVEN in the last 168 hours.  Recent  Results (from the past 240 hour(s))  SARS Coronavirus 2 (CEPHEID- Performed in [place] hospital lab), Hosp Order     Status: None   Collection Time: 05/14/19  6:22 PM  Result Value Ref Range Status   SARS Coronavirus 2 NEGATIVE NEGATIVE Final    Comment: (NOTE) If result is NEGATIVE SARS-CoV-2 target nucleic acids are NOT DETECTED. The SARS-CoV-2 RNA is generally detectable in upper and lower  respiratory specimens during the acute phase of infection. The lowest  concentration of SARS-CoV-2 viral copies this assay can detect is 250  copies / mL. A negative result does not preclude SARS-CoV-2 infection  and should not be used as the sole basis for treatment or other  patient management decisions.  A negative result may occur with  improper specimen collection / handling, submission of specimen other  than nasopharyngeal swab, presence of viral mutation(s) within the  areas targeted by this assay, and inadequate number of viral copies  (<250 copies / mL). A negative result must be combined with clinical  observations, patient history, and epidemiological information. If result is POSITIVE SARS-CoV-2 target nucleic acids are DETECTED. The SARS-CoV-2 RNA is generally detectable in upper and lower  respiratory specimens dur ing the acute phase of infection.  Positive  results are indicative of active infection with SARS-CoV-2.  Clinical  correlation with patient history and other diagnostic information is  necessary to determine patient infection status.  Positive results do  not rule out bacterial infection or co-infection with other viruses. If result is PRESUMPTIVE POSTIVE SARS-CoV-2 nucleic acids MAY BE PRESENT.   A presumptive positive result was obtained on the submitted specimen  and confirmed on repeat testing.  While 2019 novel coronavirus  (SARS-CoV-2) nucleic acids may be present in the submitted sample  additional confirmatory testing may be necessary for epidemiological   and / or clinical management purposes  to differentiate between  SARS-CoV-2 and other Sarbecovirus currently known to infect humans.  If clinically indicated additional testing with an alternate test  methodology [phone]) is advised. The SARS-CoV-2 RNA is generally  detectable in upper and lower respiratory sp ecimens during the acute  phase of infection. The expected result is Negative. Fact Sheet for Patients:  StrictlyIdeas.no Fact Sheet for Healthcare Providers: BankingDealers.co.za This test is not yet approved or cleared by the [place] FDA and has been authorized for detection and/or diagnosis of SARS-CoV-2 by FDA under an Emergency Use Authorization (EUA).  This EUA will remain in effect (meaning this test can be used) for  the duration of the COVID-19 declaration under Section 564(b)(1) of the Act, 21 U.S.C. section 360bbb-3(b)(1), unless the authorization is terminated or revoked sooner. Performed at [facility], [place] [address], [place], [place] [address]   MRSA PCR Screening     Status: Abnormal   Collection Time: 05/14/19 11:54 PM  Result Value Ref Range Status   MRSA by PCR POSITIVE (A) NEGATIVE Final    Comment:        The GeneXpert MRSA Assay (FDA approved for NASAL specimens only), is one component of a comprehensive MRSA colonization surveillance program. It is not intended to diagnose MRSA infection nor to guide or monitor treatment for MRSA infections. RESULT CALLED TO, READ BACK BY AND VERIFIED WITH: LANELY,S RN @0303  ON 05/15/2019 JACKSON,K Performed at [facility], [place] [address], [place], [place] [address]   Respiratory Panel by PCR     Status: None   Collection Time: 05/15/19  2:35 AM  Result Value Ref Range Status   Adenovirus NOT DETECTED NOT DETECTED Final   Coronavirus 229E NOT DETECTED NOT DETECTED Final    Comment: (NOTE) The Coronavirus on the  Respiratory Panel, DOES NOT test for the novel  Coronavirus (2019 nCoV)    Coronavirus HKU1 NOT DETECTED NOT DETECTED Final   Coronavirus NL63 NOT DETECTED NOT DETECTED Final   Coronavirus OC43 NOT DETECTED NOT DETECTED Final   Metapneumovirus NOT DETECTED NOT DETECTED Final   Rhinovirus / Enterovirus NOT DETECTED NOT DETECTED Final   Influenza A NOT DETECTED NOT DETECTED Final   Influenza B NOT DETECTED NOT DETECTED Final   Parainfluenza Virus 1 NOT DETECTED NOT DETECTED Final   Parainfluenza Virus 2 NOT DETECTED NOT DETECTED Final   Parainfluenza Virus 3 NOT DETECTED NOT DETECTED Final   Parainfluenza Virus 4 NOT DETECTED NOT DETECTED Final   Respiratory Syncytial Virus NOT DETECTED NOT DETECTED Final   Bordetella pertussis NOT DETECTED NOT DETECTED Final   Chlamydophila pneumoniae NOT DETECTED NOT DETECTED Final   Mycoplasma pneumoniae NOT DETECTED NOT DETECTED Final    Comment: Performed at [facility], [place]. [address] [place], [place] [address]  SARS Coronavirus 2 [facility] order, Performed in [facility] hospital lab)     Status: None   Collection Time: 05/15/19  9:03 AM  Result Value Ref Range Status   SARS Coronavirus 2 NEGATIVE NEGATIVE Final    Comment: (NOTE) If result is NEGATIVE SARS-CoV-2 target nucleic acids are NOT DETECTED. The SARS-CoV-2 RNA is generally detectable in upper and lower  respiratory specimens during the acute phase of infection. The lowest  concentration of SARS-CoV-2 viral copies this assay can detect is 250  copies / mL. A negative result does not preclude SARS-CoV-2 infection  and should not be used as the sole basis for treatment or other  patient management decisions.  A negative result may occur with  improper specimen collection / handling, submission of specimen other  than nasopharyngeal swab, presence of viral mutation(s) within the  areas targeted by this assay, and inadequate number of viral copies  (<250 copies / mL). A negative  result must be combined with clinical  observations, patient history, and epidemiological information. If result is POSITIVE SARS-CoV-2 target nucleic acids are DETECTED. The SARS-CoV-2 RNA is generally detectable in upper and lower  respiratory specimens dur ing the acute phase of infection.  Positive  results are indicative of active infection with SARS-CoV-2.  Clinical  correlation with patient history and other diagnostic information  is  necessary to determine patient infection status.  Positive results do  not rule out bacterial infection or co-infection with other viruses. If result is PRESUMPTIVE POSTIVE SARS-CoV-2 nucleic acids MAY BE PRESENT.   A presumptive positive result was obtained on the submitted specimen  and confirmed on repeat testing.  While 2019 novel coronavirus  (SARS-CoV-2) nucleic acids may be present in the submitted sample  additional confirmatory testing may be necessary for epidemiological  and / or clinical management purposes  to differentiate between  SARS-CoV-2 and other Sarbecovirus currently known to infect humans.  If clinically indicated additional testing with an alternate test  methodology [phone]) is advised. The SARS-CoV-2 RNA is generally  detectable in upper and lower respiratory sp ecimens during the acute  phase of infection. The expected result is Negative. Fact Sheet for Patients:  StrictlyIdeas.no Fact Sheet for Healthcare Providers: BankingDealers.co.za This test is not yet approved or cleared by the [place] FDA and has been authorized for detection and/or diagnosis of SARS-CoV-2 by FDA under an Emergency Use Authorization (EUA).  This EUA will remain in effect (meaning this test can be used) for the duration of the COVID-19 declaration under Section 564(b)(1) of the Act, 21 U.S.C. section 360bbb-3(b)(1), unless the authorization is terminated or revoked sooner. Performed at [facility], [place] [address], [place], [place] [address]        Radiology Studies: No results found.    Scheduled Meds: . Chlorhexidine Gluconate Cloth  6 each Topical Daily  . cholecalciferol  1,000 Units Oral Daily  . enoxaparin (LOVENOX) injection  60 mg Subcutaneous Daily  . fluticasone  1 spray Each Nare Daily  . fluticasone furoate-vilanterol  1 puff Inhalation Daily  . insulin aspart  0-20 Units Subcutaneous TID WC  . lamoTRIgine  25 mg Oral Daily  . loratadine  10 mg Oral Daily  . mouth rinse  15 mL Mouth Rinse q12n4p  . metoprolol succinate  12.5 mg Oral Daily  . multivitamin  15 mL Oral Daily  . omega-3 acid ethyl esters  1 g Oral Daily  . pantoprazole  40 mg Oral Daily  . pravastatin  40 mg Oral QHS  . predniSONE  50 mg Oral Q breakfast  . senna  2 tablet Oral QHS  . venlafaxine XR  300 mg Oral Q breakfast   And  . venlafaxine XR  37.5 mg Oral Q breakfast  . vitamin C  3,000 mg Oral Daily   Continuous Infusions:   LOS: 8 days    Time spent: 25 minutes   Dessa Phi, DO [facility] [email or web address] 05/22/2019, 10:39 AM

## 2019-05-23 LAB — BASIC METABOLIC PANEL
Anion gap: 12 (ref 5–15)
BUN: 19 mg/dL (ref 6–20)
CO2: 26 mmol/L (ref 22–32)
Calcium: 9.3 mg/dL (ref 8.9–10.3)
Chloride: 104 mmol/L (ref 98–111)
Creatinine, Ser: 0.68 mg/dL (ref 0.44–1.00)
GFR calc Af Amer: >60 mL/min (ref >60)
GFR calc non Af Amer: >60 mL/min (ref >60)
Glucose, Bld: 149 mg/dL — ABNORMAL HIGH (ref 70–99)
Potassium: 5.3 mmol/L — ABNORMAL HIGH (ref 3.5–5.1)
Sodium: 142 mmol/L (ref 135–145)

## 2019-05-23 LAB — GLUCOSE, CAPILLARY: Glucose-Capillary: 100 mg/dL — ABNORMAL HIGH (ref 70–99)

## 2019-05-23 LAB — POTASSIUM: Potassium: 4.1 mmol/L (ref 3.5–5.1)

## 2019-05-23 MED ORDER — OXYCODONE-ACETAMINOPHEN 10-325 MG PO TABS: ORAL_TABLET | ORAL | 0 refills | Status: AC

## 2019-05-23 MED ORDER — PREDNISONE 10 MG PO TABS: ORAL_TABLET | ORAL | 0 refills | Status: AC

## 2019-05-23 NOTE — TOC Transition Note (Signed)
Transition of Care [facility]) - CM/SW Discharge Note   Patient Details  Name: Kathleen Roy MRN: [id number] Date of Birth: 1961-11-14  Transition of Care [facility]) CM/SW Contact:  Wende Neighbors, [name] Phone Number: 05/23/2019, 11:23 AM   Clinical Narrative:  Patient to return back to [facility] via [facility]. RN to please call [phone] for report    Final next level of care: Skilled Nursing Facility Barriers to Discharge: No Barriers Identified   Patient Goals and CMS Choice Patient states their goals for this hospitalization and ongoing recovery are:: did not particpate CMS Medicare.gov Compare Post Acute Care list provided to:: (na) Choice offered to / list presented to : NA  Discharge Placement              Patient [facility] bed at: [facility] Patient to be transferred to facility by: ptar Name of family member notified: spoke with pt friend (teresa) Patient and family notified of of transfer: 05/23/19  Discharge Plan and Services     Post Acute Care Choice: [facility]                               Social Determinants of Health (SDOH) Interventions     Readmission Risk Interventions No flowsheet data found.

## 2019-05-23 NOTE — Discharge Summary (Signed)
Physician Discharge Summary  Kathleen Roy DDU:[id number] DOB: 09/20/1961 DOA: 05/14/2019  PCP: [name], MD  Admit date: 05/14/2019 Discharge date: 05/23/2019  Admitted From: SNF Disposition:  SNF   Recommendations for Outpatient Follow-up:  1. Follow up with PCP in 1 week  Discharge Condition: Stable, improved CODE STATUS: DNR  Diet recommendation: Heart healthy, carb modified   Brief/Interim Summary: Kathleen Roy a 58 y.o.femalewith medical history significant ofCOPD,chronic respiratory failure on 4 L nasal cannula oxygen,HTN, HLD, diet-controlled DM, dCHF,morbid obesity, OSA, hypoventilation syndrome, migraine headache, fibromyalgia, memory loss, who presents withSOB andoxygen desaturation. Per report, pt was found to haveoxygen desaturation to low 80s on baseline 4 L nasal cannula oxygen in the facility.Ptisplaced on NRB at 15 L O2- 95%. She has hx ofmemory loss. She is mildly confused. She response to calling her name, but is not oriented to place and time.She does not complain of any pain.Does not seem to have chest pain or abdominal pain. No active cough, nausea,vomiting, diarrhea noted. Not sure if pt hassymptoms of UTI. She moves all extremities on painful stimuli. No facial droop.  ED Course: Pt was found to have BNP 36.9, negative COVID-19 test, negative trop,renal function normal, temperature normal, heart rate in 90s, RR 25, oxygen 95% on BiPAP.VBG withpH7.301, CO2 118, O2 49.1. Patient is admitted to stepdown bed as inpatient.  Interim: Patient has been treated for acute on chronic respiratory failure with hypoxia and hypercapnia, with concern for probable acute COPD exacerbation and acute on chronic diastolic heart failure, OSA/OHS.  PCCM was consulted.  She was initially diuresed, developed hypernatremia with contraction alkalosis and diuretic stopped.  She remained stable.  She continue treatments for COPD including Solu-Medrol, nebs and  Levaquin.  She was also offered BiPAP nightly and with naps during the day.  Her respiratory status continued to improve and slowly her oxygen level requirement weaned down to her baseline of 4 L.  Discharge Diagnoses:  Principal Problem:   Acute on chronic respiratory failure with hypoxia and hypercapnia (HCC) Active Problems:   Major depressive disorder, recurrent episode (HCC)   COPD (chronic obstructive pulmonary disease) (HCC)   Hypokalemia   OSA (obstructive sleep apnea)   Obesity hypoventilation syndrome (HCC)   Chronic diastolic congestive heart failure (HCC)   Essential hypertension   Diabetes mellitus without complication (HCC)   Macrocytic anemia   Acute metabolic encephalopathy   Acute on chronic diastolic congestive heart failure (HCC)   Hypernatremia   Acute on chronic respiratory failure with hypoxia and hypercapnia -Likely multifactorial secondary to probable acute COPD exacerbation, acute on chronic diastolic CHF exacerbation in the setting of OSA and hypoventilation syndrome -SARS-CoV2 negative x3 -Respiratory viral panel negative -CXR: Diffuse lung density, similar to previous studies but possibly slightly more pronounced. -CTA chest: Dependent bilateral lower lobe airspace disease could be due to pneumonia, atelectasis or aspiration. Cardiomegaly and findings compatible with mild interstitial pulmonary edema. Small pleural effusions noted. -PCCM consulted, signed off 6/1 -Continue O2 baseline 4L   Acute COPD exacerbation -Now on PO prednisone -taper at discharge, scheduled nebs.Completed 5 day levaquin   Acute on chronic diastolic CHF -Echo EF of 60 to 65% with grade 2 diastolic dysfunction -Patient was on torsemide twice daily. Patient wason Lasix 80 mg IV every 12 hours. Patient with a worsening hypernatremia and a contraction alkalosis and as such IV diuretics have been held.  Can resume torsemide as an outpatient -Strict I's and O's. Daily weights.   -Appears to be stable currently  Major depressive disorder, recurrent -Continue Effexor, lamictal   Acute metabolic encephalopathy and history of memory loss -Likely secondary to problem #1. Stable    Obstructive sleep apnea/obesity hypoventilation syndrome -On BiPAP qhs and with naps   Hypertension -Continue Toprol-XL   Well-controlled diabetes mellitus type 2 -Hemoglobin A1c 5.5. SSI   HLD -Continue pravachol   Discharge Instructions  Discharge Instructions    Diet - low sodium heart healthy   Complete by:  As directed    Increase activity slowly   Complete by:  As directed      Allergies as of 05/23/2019      Reactions   Aspirin Hives      Medication List    STOP taking these medications   amoxicillin-clavulanate 875-125 MG tablet Commonly known as:  AUGMENTIN   atorvastatin 10 MG tablet Commonly known as:  LIPITOR   cephALEXin 500 MG capsule Commonly known as:  KEFLEX   diclofenac sodium 1 % Gel Commonly known as:  VOLTAREN     TAKE these medications   albuterol 108 (90 Base) MCG/ACT inhaler Commonly known as:  VENTOLIN HFA Inhale 2 puffs into the lungs every 4 (four) hours as needed for wheezing or shortness of breath.   ARIPiprazole 2 MG tablet Commonly known as:  ABILIFY Take 1 tablet (2 mg total) by mouth every other day.   Breo Ellipta 100-25 MCG/INH Aepb Generic drug:  fluticasone furoate-vilanterol Inhale 1 puff daily into the lungs.   CERTAGEN PO Take 1 tablet by mouth daily.   cetirizine 10 MG tablet Commonly known as:  ZYRTEC Take 1 tablet (10 mg total) by mouth daily.   feeding supplement (PRO-STAT SUGAR FREE 64) Liqd Take 30 mLs by mouth 2 (two) times daily.   fexofenadine 180 MG tablet Commonly known as:  ALLEGRA Take 180 mg by mouth daily.   Fish Oil 1000 MG Caps Take 2 capsules (2,000 mg total) by mouth at bedtime.   fluticasone 50 MCG/ACT nasal spray Commonly known as:  FLONASE Place 1 spray into both  nostrils daily.   lamoTRIgine 25 MG tablet Commonly known as:  LAMICTAL Take 25 mg by mouth daily.   metoprolol succinate 25 MG 24 hr tablet Commonly known as:  TOPROL-XL Take 12.5 mg by mouth daily.   multivitamin Liqd Take 5 mLs by mouth daily.   nystatin cream Commonly known as:  MYCOSTATIN Apply to affected area 2 times daily   oxyCODONE-acetaminophen 10-325 MG tablet Commonly known as:  PERCOCET Take one tablet by mouth every 6 hours as needed for pain What changed:    how much to take  how to take this  when to take this  additional instructions   OXYGEN Inhale 4 L into the lungs continuous.   polyethylene glycol 17 g packet Commonly known as:  MIRALAX / GLYCOLAX Take 17 g by mouth daily. Mix in 8 oz liquid and drink   potassium chloride 10 MEQ tablet Commonly known as:  K-DUR Take 40 mEq by mouth at bedtime. What changed:  Another medication with the same name was removed. Continue taking this medication, and follow the directions you see here.   pravastatin 40 MG tablet Commonly known as:  PRAVACHOL Take 40 mg by mouth at bedtime.   predniSONE 10 MG tablet Commonly known as:  DELTASONE Take 4 tabs for 3 days, then 3 tabs for 3 days, then 2 tabs for 3 days, then 1 tab for 3 days, then 1/2 tab for 4 days. What  changed:    medication strength  how much to take  how to take this  when to take this  additional instructions   pregabalin 150 MG capsule Commonly known as:  LYRICA Take 1 capsule (150 mg total) by mouth 2 (two) times daily.   PREVIDENT 5000 PLUS DT Place 1 application onto teeth daily.   senna 8.6 MG Tabs tablet Commonly known as:  SENOKOT Take 2 tablets by mouth at bedtime.   sodium chloride 0.65 % Soln nasal spray Commonly known as:  OCEAN Place 2 sprays into both nostrils 2 (two) times daily as needed for congestion (nose bleed/dryness).   torsemide 20 MG tablet Commonly known as:  DEMADEX Take 40 mg by mouth 2 (two)  times daily.   trazodone 300 MG tablet Commonly known as:  DESYREL Take 1 tablet (300 mg total) by mouth at bedtime.   venlafaxine XR 150 MG 24 hr capsule Commonly known as:  EFFEXOR-XR Take 300 mg by mouth daily. Along with 37.5 to equal 337.5   venlafaxine XR 37.5 MG 24 hr capsule Commonly known as:  EFFEXOR-XR Take 37.5 mg by mouth daily. Along with 300mg  to equal 337.5mg    vitamin C 1000 MG tablet Take 3,000 mg daily by mouth.   VITAMIN D3 PO Take 1,000 Units by mouth daily.      Follow-up Information    [name], MD. Schedule an appointment as soon as possible for a visit in 1 week(s).   Specialty:  Internal Medicine Contact information: [address] [place] [address] [phone]          Allergies  Allergen Reactions  . Aspirin Hives    Consultations:  PCCM    Procedures/Studies: Ct Angio Chest Pe W Or Wo Contrast  Result Date: 05/15/2019 CLINICAL DATA:  Patient admitted 05/14/2019 for shortness of breath and decreased oxygen saturation. History of COPD. EXAM: CT ANGIOGRAPHY CHEST WITH CONTRAST TECHNIQUE: Multidetector CT imaging of the chest was performed using the standard protocol during bolus administration of intravenous contrast. Multiplanar CT image reconstructions and MIPs were obtained to evaluate the vascular anatomy. CONTRAST:  100 mL OMNIPAQUE IOHEXOL 350 MG/ML SOLN COMPARISON:  Single-view of the chest 05/14/2019, 01/10/2019 and 11/26/2016. FINDINGS: Cardiovascular: The study is somewhat limited by bolus timing but no pulmonary embolus is identified. There is cardiomegaly. No pericardial effusion. No aortic aneurysm. Mediastinum/Nodes: No lymphadenopathy. Thyromegaly is most notable in the left lobe. The esophagus is unremarkable. Lungs/Pleura: Small bilateral pleural effusions greater on the left. Dependent bilateral lower lobe airspace disease with air bronchograms is seen. Scattered ground-glass attenuation and mild  interlobular septal thickening are identified. Upper Abdomen: There is fatty infiltration of the liver and hepatomegaly. The patient is status post cholecystectomy. Musculoskeletal: No acute or focal bony abnormality. Review of the MIP images confirms the above findings. IMPRESSION: Negative for pulmonary embolus. Dependent bilateral lower lobe airspace disease could be due to pneumonia, atelectasis or aspiration. Cardiomegaly and findings compatible with mild interstitial pulmonary edema. Small pleural effusions noted. Hepatomegaly and fatty infiltration of the liver. Electronically Signed   By: Inge Rise M.D.   On: 05/15/2019 15:03   Dg Chest Portable 1 View  Result Date: 05/14/2019 CLINICAL DATA:  Low oxygen saturation. EXAM: PORTABLE CHEST 1 VIEW COMPARISON:  01/10/2019 and multiple previous FINDINGS: The patient is rotated towards the right as usual. Cardiac silhouette is enlarged. There is diffuse pulmonary density which appear slightly more prominent than seen previously. This could reflect pulmonary edema  superimposed on chronic lung disease. No dense consolidation or lobar collapse. IMPRESSION: Rotated. Cardiomegaly. Diffuse lung density, similar to previous studies but possibly slightly more pronounced. This could represent mild edema superimposed upon chronic lung disease. Electronically Signed   By: Nelson Chimes M.D.   On: 05/14/2019 19:11      Discharge Exam: Vitals:   05/23/19 0811 05/23/19 1015  BP:    Pulse:    Resp:    Temp:    SpO2: 92% 94%    General: Pt is alert, awake, not in acute distress Cardiovascular: RRR, S1/S2 +, no rubs, no gallops Respiratory: CTA bilaterally anteriorly, difficult exam due to body habitus, no wheezing, no rhonchi, on [place] O2  Abdominal: Soft, NT, ND, bowel sounds + Extremities: no edema, no cyanosis    The results of significant diagnostics from this hospitalization (including imaging, microbiology, ancillary and laboratory) are listed  below for reference.     Microbiology: Recent Results (from the past 240 hour(s))  SARS Coronavirus 2 (CEPHEID- Performed in [place] hospital lab), Hosp Order     Status: None   Collection Time: 05/14/19  6:22 PM  Result Value Ref Range Status   SARS Coronavirus 2 NEGATIVE NEGATIVE Final    Comment: (NOTE) If result is NEGATIVE SARS-CoV-2 target nucleic acids are NOT DETECTED. The SARS-CoV-2 RNA is generally detectable in upper and lower  respiratory specimens during the acute phase of infection. The lowest  concentration of SARS-CoV-2 viral copies this assay can detect is 250  copies / mL. A negative result does not preclude SARS-CoV-2 infection  and should not be used as the sole basis for treatment or other  patient management decisions.  A negative result may occur with  improper specimen collection / handling, submission of specimen other  than nasopharyngeal swab, presence of viral mutation(s) within the  areas targeted by this assay, and inadequate number of viral copies  (<250 copies / mL). A negative result must be combined with clinical  observations, patient history, and epidemiological information. If result is POSITIVE SARS-CoV-2 target nucleic acids are DETECTED. The SARS-CoV-2 RNA is generally detectable in upper and lower  respiratory specimens dur ing the acute phase of infection.  Positive  results are indicative of active infection with SARS-CoV-2.  Clinical  correlation with patient history and other diagnostic information is  necessary to determine patient infection status.  Positive results do  not rule out bacterial infection or co-infection with other viruses. If result is PRESUMPTIVE POSTIVE SARS-CoV-2 nucleic acids MAY BE PRESENT.   A presumptive positive result was obtained on the submitted specimen  and confirmed on repeat testing.  While 2019 novel coronavirus  (SARS-CoV-2) nucleic acids may be present in the submitted sample  additional  confirmatory testing may be necessary for epidemiological  and / or clinical management purposes  to differentiate between  SARS-CoV-2 and other Sarbecovirus currently known to infect humans.  If clinically indicated additional testing with an alternate test  methodology [phone]) is advised. The SARS-CoV-2 RNA is generally  detectable in upper and lower respiratory sp ecimens during the acute  phase of infection. The expected result is Negative. Fact Sheet for Patients:  StrictlyIdeas.no Fact Sheet for Healthcare Providers: BankingDealers.co.za This test is not yet approved or cleared by the [place] FDA and has been authorized for detection and/or diagnosis of SARS-CoV-2 by FDA under an Emergency Use Authorization (EUA).  This EUA will remain in effect (meaning this test can be used) for the duration of  the COVID-19 declaration under Section 564(b)(1) of the Act, 21 U.S.C. section 360bbb-3(b)(1), unless the authorization is terminated or revoked sooner. Performed at [facility], [place] [address], [place], [place] [address]   MRSA PCR Screening     Status: Abnormal   Collection Time: 05/14/19 11:54 PM  Result Value Ref Range Status   MRSA by PCR POSITIVE (A) NEGATIVE Final    Comment:        The GeneXpert MRSA Assay (FDA approved for NASAL specimens only), is one component of a comprehensive MRSA colonization surveillance program. It is not intended to diagnose MRSA infection nor to guide or monitor treatment for MRSA infections. RESULT CALLED TO, READ BACK BY AND VERIFIED WITH: LANELY,S RN @0303  ON 05/15/2019 JACKSON,K Performed at [facility], [place] [address] [place], [place] [address]   Respiratory Panel by PCR     Status: None   Collection Time: 05/15/19  2:35 AM  Result Value Ref Range Status   Adenovirus NOT DETECTED NOT DETECTED Final   Coronavirus 229E NOT DETECTED NOT DETECTED  Final    Comment: (NOTE) The Coronavirus on the Respiratory Panel, DOES NOT test for the novel  Coronavirus (2019 nCoV)    Coronavirus HKU1 NOT DETECTED NOT DETECTED Final   Coronavirus NL63 NOT DETECTED NOT DETECTED Final   Coronavirus OC43 NOT DETECTED NOT DETECTED Final   Metapneumovirus NOT DETECTED NOT DETECTED Final   Rhinovirus / Enterovirus NOT DETECTED NOT DETECTED Final   Influenza A NOT DETECTED NOT DETECTED Final   Influenza B NOT DETECTED NOT DETECTED Final   Parainfluenza Virus 1 NOT DETECTED NOT DETECTED Final   Parainfluenza Virus 2 NOT DETECTED NOT DETECTED Final   Parainfluenza Virus 3 NOT DETECTED NOT DETECTED Final   Parainfluenza Virus 4 NOT DETECTED NOT DETECTED Final   Respiratory Syncytial Virus NOT DETECTED NOT DETECTED Final   Bordetella pertussis NOT DETECTED NOT DETECTED Final   Chlamydophila pneumoniae NOT DETECTED NOT DETECTED Final   Mycoplasma pneumoniae NOT DETECTED NOT DETECTED Final    Comment: Performed at [facility], [place]. [address] [place], [place] [address]  SARS Coronavirus 2 [facility] order, Performed in [facility] hospital lab)     Status: None   Collection Time: 05/15/19  9:03 AM  Result Value Ref Range Status   SARS Coronavirus 2 NEGATIVE NEGATIVE Final    Comment: (NOTE) If result is NEGATIVE SARS-CoV-2 target nucleic acids are NOT DETECTED. The SARS-CoV-2 RNA is generally detectable in upper and lower  respiratory specimens during the acute phase of infection. The lowest  concentration of SARS-CoV-2 viral copies this assay can detect is 250  copies / mL. A negative result does not preclude SARS-CoV-2 infection  and should not be used as the sole basis for treatment or other  patient management decisions.  A negative result may occur with  improper specimen collection / handling, submission of specimen other  than nasopharyngeal swab, presence of viral mutation(s) within the  areas targeted by this assay, and inadequate number  of viral copies  (<250 copies / mL). A negative result must be combined with clinical  observations, patient history, and epidemiological information. If result is POSITIVE SARS-CoV-2 target nucleic acids are DETECTED. The SARS-CoV-2 RNA is generally detectable in upper and lower  respiratory specimens dur ing the acute phase of infection.  Positive  results are indicative of active infection with SARS-CoV-2.  Clinical  correlation with patient history and other diagnostic information is  necessary  to determine patient infection status.  Positive results do  not rule out bacterial infection or co-infection with other viruses. If result is PRESUMPTIVE POSTIVE SARS-CoV-2 nucleic acids MAY BE PRESENT.   A presumptive positive result was obtained on the submitted specimen  and confirmed on repeat testing.  While 2019 novel coronavirus  (SARS-CoV-2) nucleic acids may be present in the submitted sample  additional confirmatory testing may be necessary for epidemiological  and / or clinical management purposes  to differentiate between  SARS-CoV-2 and other Sarbecovirus currently known to infect humans.  If clinically indicated additional testing with an alternate test  methodology [phone]) is advised. The SARS-CoV-2 RNA is generally  detectable in upper and lower respiratory sp ecimens during the acute  phase of infection. The expected result is Negative. Fact Sheet for Patients:  StrictlyIdeas.no Fact Sheet for Healthcare Providers: BankingDealers.co.za This test is not yet approved or cleared by the [place] FDA and has been authorized for detection and/or diagnosis of SARS-CoV-2 by FDA under an Emergency Use Authorization (EUA).  This EUA will remain in effect (meaning this test can be used) for the duration of the COVID-19 declaration under Section 564(b)(1) of the Act, 21 U.S.C. section 360bbb-3(b)(1), unless the authorization is  terminated or revoked sooner. Performed at [facility], [place] [address], [place], [place] [address]   SARS Coronavirus 2 (CEPHEID - Performed in [place] hospital lab), Hosp Order     Status: None   Collection Time: 05/22/19 11:43 AM  Result Value Ref Range Status   SARS Coronavirus 2 NEGATIVE NEGATIVE Final    Comment: (NOTE) If result is NEGATIVE SARS-CoV-2 target nucleic acids are NOT DETECTED. The SARS-CoV-2 RNA is generally detectable in upper and lower  respiratory specimens during the acute phase of infection. The lowest  concentration of SARS-CoV-2 viral copies this assay can detect is 250  copies / mL. A negative result does not preclude SARS-CoV-2 infection  and should not be used as the sole basis for treatment or other  patient management decisions.  A negative result may occur with  improper specimen collection / handling, submission of specimen other  than nasopharyngeal swab, presence of viral mutation(s) within the  areas targeted by this assay, and inadequate number of viral copies  (<250 copies / mL). A negative result must be combined with clinical  observations, patient history, and epidemiological information. If result is POSITIVE SARS-CoV-2 target nucleic acids are DETECTED. The SARS-CoV-2 RNA is generally detectable in upper and lower  respiratory specimens dur ing the acute phase of infection.  Positive  results are indicative of active infection with SARS-CoV-2.  Clinical  correlation with patient history and other diagnostic information is  necessary to determine patient infection status.  Positive results do  not rule out bacterial infection or co-infection with other viruses. If result is PRESUMPTIVE POSTIVE SARS-CoV-2 nucleic acids MAY BE PRESENT.   A presumptive positive result was obtained on the submitted specimen  and confirmed on repeat testing.  While 2019 novel coronavirus  (SARS-CoV-2) nucleic acids may be present in the  submitted sample  additional confirmatory testing may be necessary for epidemiological  and / or clinical management purposes  to differentiate between  SARS-CoV-2 and other Sarbecovirus currently known to infect humans.  If clinically indicated additional testing with an alternate test  methodology [phone]) is advised. The SARS-CoV-2 RNA is generally  detectable in upper and lower respiratory sp ecimens during the acute  phase of infection. The expected  result is Negative. Fact Sheet for Patients:  StrictlyIdeas.no Fact Sheet for Healthcare Providers: BankingDealers.co.za This test is not yet approved or cleared by the [place] FDA and has been authorized for detection and/or diagnosis of SARS-CoV-2 by FDA under an Emergency Use Authorization (EUA).  This EUA will remain in effect (meaning this test can be used) for the duration of the COVID-19 declaration under Section 564(b)(1) of the Act, 21 U.S.C. section 360bbb-3(b)(1), unless the authorization is terminated or revoked sooner. Performed at [facility], [place] [address], [place], [place] [address]      Labs: BNP (last 3 results) Recent Labs    01/10/19 1844 05/14/19 1802  BNP 19.8 91.6   Basic Metabolic Panel: Recent Labs  Lab 05/17/19 0316  05/18/19 0338 05/19/19 0258 05/20/19 0347 05/21/19 0338 05/22/19 0217 05/23/19 0456 05/23/19 0836  NA 145   < > 143 140 139 138 140 142  --   K 2.7*   < > 3.2* 3.6 5.0 4.6 4.3 5.3* 4.1  CL 91*   < > 95* 97* 98 100 101 104  --   CO2 41*   < > 36* 35* 33* 28 30 26   --   GLUCOSE 144*   < > 170* 169* 222* 239* 146* 149*  --   BUN 22*   < > 22* 21* 23* 22* 23* 19  --   CREATININE 0.93   < > 0.80 0.73 0.84 0.77 0.82 0.68  --   CALCIUM 9.8   < > 9.9 9.5 9.6 9.3 9.5 9.3  --   MG 2.1  --  2.0  --   --   --   --   --   --    < > = values in this interval not displayed.   Liver Function Tests: No results for  input(s): AST, ALT, ALKPHOS, BILITOT, PROT, ALBUMIN in the last 168 hours. No results for input(s): LIPASE, AMYLASE in the last 168 hours. No results for input(s): AMMONIA in the last 168 hours. CBC: Recent Labs  Lab 05/17/19 0316 05/18/19 0338 05/19/19 0258  WBC 4.8 4.6 5.7  NEUTROABS 3.6 3.5 3.9  HGB 8.8* 8.7* 9.0*  HCT 31.1* 31.1* 30.9*  MCV 104.0* 103.0* 101.0*  PLT 189 182 162   Cardiac Enzymes: No results for input(s): CKTOTAL, CKMB, CKMBINDEX, TROPONINI in the last 168 hours. BNP: Invalid input(s): POCBNP CBG: Recent Labs  Lab 05/22/19 1216 05/22/19 1607 05/22/19 1703 05/22/19 2121 05/23/19 0757  GLUCAP 217* 245* 217* 213* 100*   D-Dimer No results for input(s): DDIMER in the last 72 hours. Hgb A1c No results for input(s): HGBA1C in the last 72 hours. Lipid Profile No results for input(s): CHOL, HDL, LDLCALC, TRIG, CHOLHDL, LDLDIRECT in the last 72 hours. Thyroid function studies No results for input(s): TSH, T4TOTAL, T3FREE, THYROIDAB in the last 72 hours.  Invalid input(s): FREET3 Anemia work up No results for input(s): VITAMINB12, FOLATE, FERRITIN, TIBC, IRON, RETICCTPCT in the last 72 hours. Urinalysis    Component Value Date/Time   COLORURINE YELLOW 05/14/2019 2059   APPEARANCEUR HAZY (A) 05/14/2019 2059   LABSPEC 1.012 05/14/2019 2059   PHURINE 6.0 05/14/2019 2059   GLUCOSEU NEGATIVE 05/14/2019 2059   HGBUR SMALL (A) 05/14/2019 2059   BILIRUBINUR NEGATIVE 05/14/2019 2059   [place] 05/14/2019 2059   PROTEINUR 30 (A) 05/14/2019 2059   UROBILINOGEN 0.2 04/11/2013 0749   NITRITE NEGATIVE 05/14/2019 2059   LEUKOCYTESUR NEGATIVE 05/14/2019 2059   Sepsis Labs  Invalid input(s): PROCALCITONIN,  WBC,  LACTICIDVEN Microbiology Recent Results (from the past 240 hour(s))  SARS Coronavirus 2 (CEPHEID- Performed in [place] hospital lab), Hosp Order     Status: None   Collection Time: 05/14/19  6:22 PM  Result Value Ref Range Status   SARS  Coronavirus 2 NEGATIVE NEGATIVE Final    Comment: (NOTE) If result is NEGATIVE SARS-CoV-2 target nucleic acids are NOT DETECTED. The SARS-CoV-2 RNA is generally detectable in upper and lower  respiratory specimens during the acute phase of infection. The lowest  concentration of SARS-CoV-2 viral copies this assay can detect is 250  copies / mL. A negative result does not preclude SARS-CoV-2 infection  and should not be used as the sole basis for treatment or other  patient management decisions.  A negative result may occur with  improper specimen collection / handling, submission of specimen other  than nasopharyngeal swab, presence of viral mutation(s) within the  areas targeted by this assay, and inadequate number of viral copies  (<250 copies / mL). A negative result must be combined with clinical  observations, patient history, and epidemiological information. If result is POSITIVE SARS-CoV-2 target nucleic acids are DETECTED. The SARS-CoV-2 RNA is generally detectable in upper and lower  respiratory specimens dur ing the acute phase of infection.  Positive  results are indicative of active infection with SARS-CoV-2.  Clinical  correlation with patient history and other diagnostic information is  necessary to determine patient infection status.  Positive results do  not rule out bacterial infection or co-infection with other viruses. If result is PRESUMPTIVE POSTIVE SARS-CoV-2 nucleic acids MAY BE PRESENT.   A presumptive positive result was obtained on the submitted specimen  and confirmed on repeat testing.  While 2019 novel coronavirus  (SARS-CoV-2) nucleic acids may be present in the submitted sample  additional confirmatory testing may be necessary for epidemiological  and / or clinical management purposes  to differentiate between  SARS-CoV-2 and other Sarbecovirus currently known to infect humans.  If clinically indicated additional testing with an alternate test   methodology [id number]) is advised. The SARS-CoV-2 RNA is generally  detectable in upper and lower respiratory sp ecimens during the acute  phase of infection. The expected result is Negative. Fact Sheet for Patients:  StrictlyIdeas.no Fact Sheet for Healthcare Providers: BankingDealers.co.za This test is not yet approved or cleared by the [place] FDA and has been authorized for detection and/or diagnosis of SARS-CoV-2 by FDA under an Emergency Use Authorization (EUA).  This EUA will remain in effect (meaning this test can be used) for the duration of the COVID-19 declaration under Section 564(b)(1) of the Act, 21 U.S.C. section 360bbb-3(b)(1), unless the authorization is terminated or revoked sooner. Performed at [facility], [place] [address], [place], [place] [address]   MRSA PCR Screening     Status: Abnormal   Collection Time: 05/14/19 11:54 PM  Result Value Ref Range Status   MRSA by PCR POSITIVE (A) NEGATIVE Final    Comment:        The GeneXpert MRSA Assay (FDA approved for NASAL specimens only), is one component of a comprehensive MRSA colonization surveillance program. It is not intended to diagnose MRSA infection nor to guide or monitor treatment for MRSA infections. RESULT CALLED TO, READ BACK BY AND VERIFIED WITH: LANELY,S RN @0303  ON 05/15/2019 JACKSON,K Performed at [facility], [place] [address] [place], [place] [address]   Respiratory Panel by PCR  Status: None   Collection Time: 05/15/19  2:35 AM  Result Value Ref Range Status   Adenovirus NOT DETECTED NOT DETECTED Final   Coronavirus 229E NOT DETECTED NOT DETECTED Final    Comment: (NOTE) The Coronavirus on the Respiratory Panel, DOES NOT test for the novel  Coronavirus (2019 nCoV)    Coronavirus HKU1 NOT DETECTED NOT DETECTED Final   Coronavirus NL63 NOT DETECTED NOT DETECTED Final   Coronavirus OC43 NOT DETECTED  NOT DETECTED Final   Metapneumovirus NOT DETECTED NOT DETECTED Final   Rhinovirus / Enterovirus NOT DETECTED NOT DETECTED Final   Influenza A NOT DETECTED NOT DETECTED Final   Influenza B NOT DETECTED NOT DETECTED Final   Parainfluenza Virus 1 NOT DETECTED NOT DETECTED Final   Parainfluenza Virus 2 NOT DETECTED NOT DETECTED Final   Parainfluenza Virus 3 NOT DETECTED NOT DETECTED Final   Parainfluenza Virus 4 NOT DETECTED NOT DETECTED Final   Respiratory Syncytial Virus NOT DETECTED NOT DETECTED Final   Bordetella pertussis NOT DETECTED NOT DETECTED Final   Chlamydophila pneumoniae NOT DETECTED NOT DETECTED Final   Mycoplasma pneumoniae NOT DETECTED NOT DETECTED Final    Comment: Performed at [facility], [place] [address], [place], [place] [address]  SARS Coronavirus 2 [facility] order, Performed in [facility] hospital lab)     Status: None   Collection Time: 05/15/19  9:03 AM  Result Value Ref Range Status   SARS Coronavirus 2 NEGATIVE NEGATIVE Final    Comment: (NOTE) If result is NEGATIVE SARS-CoV-2 target nucleic acids are NOT DETECTED. The SARS-CoV-2 RNA is generally detectable in upper and lower  respiratory specimens during the acute phase of infection. The lowest  concentration of SARS-CoV-2 viral copies this assay can detect is 250  copies / mL. A negative result does not preclude SARS-CoV-2 infection  and should not be used as the sole basis for treatment or other  patient management decisions.  A negative result may occur with  improper specimen collection / handling, submission of specimen other  than nasopharyngeal swab, presence of viral mutation(s) within the  areas targeted by this assay, and inadequate number of viral copies  (<250 copies / mL). A negative result must be combined with clinical  observations, patient history, and epidemiological information. If result is POSITIVE SARS-CoV-2 target nucleic acids are DETECTED. The SARS-CoV-2 RNA is generally  detectable in upper and lower  respiratory specimens dur ing the acute phase of infection.  Positive  results are indicative of active infection with SARS-CoV-2.  Clinical  correlation with patient history and other diagnostic information is  necessary to determine patient infection status.  Positive results do  not rule out bacterial infection or co-infection with other viruses. If result is PRESUMPTIVE POSTIVE SARS-CoV-2 nucleic acids MAY BE PRESENT.   A presumptive positive result was obtained on the submitted specimen  and confirmed on repeat testing.  While 2019 novel coronavirus  (SARS-CoV-2) nucleic acids may be present in the submitted sample  additional confirmatory testing may be necessary for epidemiological  and / or clinical management purposes  to differentiate between  SARS-CoV-2 and other Sarbecovirus currently known to infect humans.  If clinically indicated additional testing with an alternate test  methodology [phone]) is advised. The SARS-CoV-2 RNA is generally  detectable in upper and lower respiratory sp ecimens during the acute  phase of infection. The expected result is Negative. Fact Sheet for Patients:  StrictlyIdeas.no Fact Sheet for Healthcare Providers: BankingDealers.co.za This test is not yet  approved or cleared by the [place] and has been authorized for detection and/or diagnosis of SARS-CoV-2 by FDA under an Emergency Use Authorization (EUA).  This EUA will remain in effect (meaning this test can be used) for the duration of the COVID-19 declaration under Section 564(b)(1) of the Act, 21 U.S.C. section 360bbb-3(b)(1), unless the authorization is terminated or revoked sooner. Performed at [facility], [place] [address] [place], [place] [address]   SARS Coronavirus 2 (CEPHEID - Performed in [place] hospital lab), Hosp Order     Status: None   Collection Time: 05/22/19 11:43  AM  Result Value Ref Range Status   SARS Coronavirus 2 NEGATIVE NEGATIVE Final    Comment: (NOTE) If result is NEGATIVE SARS-CoV-2 target nucleic acids are NOT DETECTED. The SARS-CoV-2 RNA is generally detectable in upper and lower  respiratory specimens during the acute phase of infection. The lowest  concentration of SARS-CoV-2 viral copies this assay can detect is 250  copies / mL. A negative result does not preclude SARS-CoV-2 infection  and should not be used as the sole basis for treatment or other  patient management decisions.  A negative result may occur with  improper specimen collection / handling, submission of specimen other  than nasopharyngeal swab, presence of viral mutation(s) within the  areas targeted by this assay, and inadequate number of viral copies  (<250 copies / mL). A negative result must be combined with clinical  observations, patient history, and epidemiological information. If result is POSITIVE SARS-CoV-2 target nucleic acids are DETECTED. The SARS-CoV-2 RNA is generally detectable in upper and lower  respiratory specimens dur ing the acute phase of infection.  Positive  results are indicative of active infection with SARS-CoV-2.  Clinical  correlation with patient history and other diagnostic information is  necessary to determine patient infection status.  Positive results do  not rule out bacterial infection or co-infection with other viruses. If result is PRESUMPTIVE POSTIVE SARS-CoV-2 nucleic acids MAY BE PRESENT.   A presumptive positive result was obtained on the submitted specimen  and confirmed on repeat testing.  While 2019 novel coronavirus  (SARS-CoV-2) nucleic acids may be present in the submitted sample  additional confirmatory testing may be necessary for epidemiological  and / or clinical management purposes  to differentiate between  SARS-CoV-2 and other Sarbecovirus currently known to infect humans.  If clinically indicated  additional testing with an alternate test  methodology [phone]) is advised. The SARS-CoV-2 RNA is generally  detectable in upper and lower respiratory sp ecimens during the acute  phase of infection. The expected result is Negative. Fact Sheet for Patients:  StrictlyIdeas.no Fact Sheet for Healthcare Providers: BankingDealers.co.za This test is not yet approved or cleared by the [place] FDA and has been authorized for detection and/or diagnosis of SARS-CoV-2 by FDA under an Emergency Use Authorization (EUA).  This EUA will remain in effect (meaning this test can be used) for the duration of the COVID-19 declaration under Section 564(b)(1) of the Act, 21 U.S.C. section 360bbb-3(b)(1), unless the authorization is terminated or revoked sooner. Performed at [facility], [place] [address], [place], [place] [address]      Patient was seen and examined on the day of discharge and was found to be in stable condition. Time coordinating discharge: 40 minutes including assessment and coordination of care, as well as examination of the patient.   SIGNED:  Dessa Phi, DO [facility] [email or web address] 05/23/2019, 11:10 AM

## 2019-05-23 NOTE — Progress Notes (Signed)
Report called to [facility]. RN informed staff that they do not have a bed ready and will need to get oxygen and bariatric bed set up. Will call when bed is ready so that transport can be called.  Paelyn Smick W Yocelyn Brocious, RN

## 2019-05-23 NOTE — Progress Notes (Signed)
Added sterile water to [facility] 02 system.

## 2019-05-23 NOTE — Progress Notes (Signed)
Social worker spoke with admission coordinator at [facility] and they will be receiving the pt. [facility] has arrived to transport pt. Jevin Camino W Emmry Hinsch, RN

## 2019-05-23 NOTE — Progress Notes (Signed)
Pt has declined the use of BIPAP QHS, Pt encouraged to wear but still refuses.  RT to monitor and assess as needed.

## 2019-06-04 ENCOUNTER — Encounter (HOSPITAL_COMMUNITY): Payer: Self-pay | Admitting: Family Medicine

## 2019-06-04 ENCOUNTER — Emergency Department (HOSPITAL_COMMUNITY): Payer: Medicare Other

## 2019-06-04 ENCOUNTER — Inpatient Hospital Stay (HOSPITAL_COMMUNITY)
Admission: EM | Admit: 2019-06-04 | Discharge: 2019-07-17 | DRG: 871 | Disposition: E | Payer: Medicare Other | Source: Skilled Nursing Facility | Attending: Internal Medicine | Admitting: Internal Medicine

## 2019-06-04 DIAGNOSIS — I5033 Acute on chronic diastolic (congestive) heart failure: Secondary | ICD-10-CM | POA: Diagnosis present

## 2019-06-04 DIAGNOSIS — Z9049 Acquired absence of other specified parts of digestive tract: Secondary | ICD-10-CM

## 2019-06-04 DIAGNOSIS — I110 Hypertensive heart disease with heart failure: Secondary | ICD-10-CM | POA: Diagnosis present

## 2019-06-04 DIAGNOSIS — J988 Other specified respiratory disorders: Secondary | ICD-10-CM | POA: Diagnosis not present

## 2019-06-04 DIAGNOSIS — Z9981 Dependence on supplemental oxygen: Secondary | ICD-10-CM

## 2019-06-04 DIAGNOSIS — J1289 Other viral pneumonia: Secondary | ICD-10-CM | POA: Diagnosis present

## 2019-06-04 DIAGNOSIS — E1142 Type 2 diabetes mellitus with diabetic polyneuropathy: Secondary | ICD-10-CM

## 2019-06-04 DIAGNOSIS — F339 Major depressive disorder, recurrent, unspecified: Secondary | ICD-10-CM | POA: Diagnosis present

## 2019-06-04 DIAGNOSIS — Z6841 Body Mass Index (BMI) 40.0 and over, adult: Secondary | ICD-10-CM

## 2019-06-04 DIAGNOSIS — I452 Bifascicular block: Secondary | ICD-10-CM | POA: Diagnosis present

## 2019-06-04 DIAGNOSIS — J9622 Acute and chronic respiratory failure with hypercapnia: Secondary | ICD-10-CM | POA: Diagnosis present

## 2019-06-04 DIAGNOSIS — J9819 Other pulmonary collapse: Secondary | ICD-10-CM | POA: Diagnosis present

## 2019-06-04 DIAGNOSIS — R6520 Severe sepsis without septic shock: Secondary | ICD-10-CM | POA: Diagnosis present

## 2019-06-04 DIAGNOSIS — J9621 Acute and chronic respiratory failure with hypoxia: Secondary | ICD-10-CM | POA: Diagnosis present

## 2019-06-04 DIAGNOSIS — J690 Pneumonitis due to inhalation of food and vomit: Secondary | ICD-10-CM | POA: Diagnosis present

## 2019-06-04 DIAGNOSIS — I10 Essential (primary) hypertension: Secondary | ICD-10-CM | POA: Diagnosis present

## 2019-06-04 DIAGNOSIS — J069 Acute upper respiratory infection, unspecified: Secondary | ICD-10-CM | POA: Diagnosis not present

## 2019-06-04 DIAGNOSIS — E662 Morbid (severe) obesity with alveolar hypoventilation: Secondary | ICD-10-CM | POA: Diagnosis present

## 2019-06-04 DIAGNOSIS — D539 Nutritional anemia, unspecified: Secondary | ICD-10-CM | POA: Diagnosis present

## 2019-06-04 DIAGNOSIS — J441 Chronic obstructive pulmonary disease with (acute) exacerbation: Secondary | ICD-10-CM | POA: Diagnosis present

## 2019-06-04 DIAGNOSIS — U071 COVID-19: Secondary | ICD-10-CM | POA: Diagnosis present

## 2019-06-04 DIAGNOSIS — M25562 Pain in left knee: Secondary | ICD-10-CM | POA: Diagnosis present

## 2019-06-04 DIAGNOSIS — E876 Hypokalemia: Secondary | ICD-10-CM | POA: Diagnosis not present

## 2019-06-04 DIAGNOSIS — Z823 Family history of stroke: Secondary | ICD-10-CM

## 2019-06-04 DIAGNOSIS — R944 Abnormal results of kidney function studies: Secondary | ICD-10-CM | POA: Diagnosis present

## 2019-06-04 DIAGNOSIS — L8990 Pressure ulcer of unspecified site, unspecified stage: Secondary | ICD-10-CM | POA: Insufficient documentation

## 2019-06-04 DIAGNOSIS — G9341 Metabolic encephalopathy: Secondary | ICD-10-CM | POA: Diagnosis present

## 2019-06-04 DIAGNOSIS — G4733 Obstructive sleep apnea (adult) (pediatric): Secondary | ICD-10-CM | POA: Diagnosis not present

## 2019-06-04 DIAGNOSIS — E11649 Type 2 diabetes mellitus with hypoglycemia without coma: Secondary | ICD-10-CM | POA: Diagnosis not present

## 2019-06-04 DIAGNOSIS — J181 Lobar pneumonia, unspecified organism: Secondary | ICD-10-CM | POA: Diagnosis not present

## 2019-06-04 DIAGNOSIS — J189 Pneumonia, unspecified organism: Secondary | ICD-10-CM | POA: Diagnosis present

## 2019-06-04 DIAGNOSIS — Z66 Do not resuscitate: Secondary | ICD-10-CM | POA: Diagnosis present

## 2019-06-04 DIAGNOSIS — D6959 Other secondary thrombocytopenia: Secondary | ICD-10-CM | POA: Diagnosis present

## 2019-06-04 DIAGNOSIS — T502X5A Adverse effect of carbonic-anhydrase inhibitors, benzothiadiazides and other diuretics, initial encounter: Secondary | ICD-10-CM | POA: Diagnosis not present

## 2019-06-04 DIAGNOSIS — A4189 Other specified sepsis: Principal | ICD-10-CM | POA: Diagnosis present

## 2019-06-04 DIAGNOSIS — J440 Chronic obstructive pulmonary disease with acute lower respiratory infection: Secondary | ICD-10-CM | POA: Diagnosis present

## 2019-06-04 DIAGNOSIS — Z87891 Personal history of nicotine dependence: Secondary | ICD-10-CM

## 2019-06-04 DIAGNOSIS — M25561 Pain in right knee: Secondary | ICD-10-CM | POA: Diagnosis present

## 2019-06-04 DIAGNOSIS — E874 Mixed disorder of acid-base balance: Secondary | ICD-10-CM | POA: Diagnosis present

## 2019-06-04 DIAGNOSIS — M1990 Unspecified osteoarthritis, unspecified site: Secondary | ICD-10-CM | POA: Diagnosis present

## 2019-06-04 DIAGNOSIS — Z79899 Other long term (current) drug therapy: Secondary | ICD-10-CM

## 2019-06-04 DIAGNOSIS — I4891 Unspecified atrial fibrillation: Secondary | ICD-10-CM | POA: Diagnosis not present

## 2019-06-04 DIAGNOSIS — M797 Fibromyalgia: Secondary | ICD-10-CM | POA: Diagnosis present

## 2019-06-04 DIAGNOSIS — Z833 Family history of diabetes mellitus: Secondary | ICD-10-CM

## 2019-06-04 DIAGNOSIS — M545 Low back pain: Secondary | ICD-10-CM | POA: Diagnosis present

## 2019-06-04 DIAGNOSIS — I5031 Acute diastolic (congestive) heart failure: Secondary | ICD-10-CM | POA: Diagnosis not present

## 2019-06-04 DIAGNOSIS — R54 Age-related physical debility: Secondary | ICD-10-CM | POA: Diagnosis present

## 2019-06-04 DIAGNOSIS — G8929 Other chronic pain: Secondary | ICD-10-CM | POA: Diagnosis present

## 2019-06-04 DIAGNOSIS — J449 Chronic obstructive pulmonary disease, unspecified: Secondary | ICD-10-CM | POA: Diagnosis present

## 2019-06-04 DIAGNOSIS — Z8249 Family history of ischemic heart disease and other diseases of the circulatory system: Secondary | ICD-10-CM

## 2019-06-04 DIAGNOSIS — Z4659 Encounter for fitting and adjustment of other gastrointestinal appliance and device: Secondary | ICD-10-CM

## 2019-06-04 DIAGNOSIS — Z90710 Acquired absence of both cervix and uterus: Secondary | ICD-10-CM

## 2019-06-04 DIAGNOSIS — E785 Hyperlipidemia, unspecified: Secondary | ICD-10-CM | POA: Diagnosis present

## 2019-06-04 DIAGNOSIS — Z7951 Long term (current) use of inhaled steroids: Secondary | ICD-10-CM

## 2019-06-04 DIAGNOSIS — E119 Type 2 diabetes mellitus without complications: Secondary | ICD-10-CM

## 2019-06-04 LAB — CBC WITH DIFFERENTIAL/PLATELET
Abs Immature Granulocytes: 0.02 10*3/uL (ref 0.00–0.07)
Abs Immature Granulocytes: 0.04 10*3/uL (ref 0.00–0.07)
Basophils Absolute: 0 10*3/uL (ref 0.0–0.1)
Basophils Absolute: 0 10*3/uL (ref 0.0–0.1)
Basophils Relative: 0 %
Basophils Relative: 0 %
Eosinophils Absolute: 0 10*3/uL (ref 0.0–0.5)
Eosinophils Absolute: 0 10*3/uL (ref 0.0–0.5)
Eosinophils Relative: 0 %
Eosinophils Relative: 0 %
HCT: 56.4 % — ABNORMAL HIGH (ref 36.0–46.0)
HCT: 37.5 % (ref 36.0–46.0)
Hemoglobin: 16.5 g/dL — ABNORMAL HIGH (ref 12.0–15.0)
Hemoglobin: 10.9 g/dL — ABNORMAL LOW (ref 12.0–15.0)
Immature Granulocytes: 1 %
Immature Granulocytes: 1 %
Lymphocytes Relative: 10 %
Lymphocytes Relative: 8 %
Lymphs Abs: 0.3 10*3/uL — ABNORMAL LOW (ref 0.7–4.0)
Lymphs Abs: 0.5 10*3/uL — ABNORMAL LOW (ref 0.7–4.0)
MCH: 29.6 pg (ref 26.0–34.0)
MCH: 29.7 pg (ref 26.0–34.0)
MCHC: 29.3 g/dL — ABNORMAL LOW (ref 30.0–36.0)
MCHC: 29.1 g/dL — ABNORMAL LOW (ref 30.0–36.0)
MCV: 101.3 fL — ABNORMAL HIGH (ref 80.0–100.0)
MCV: 102.2 fL — ABNORMAL HIGH (ref 80.0–100.0)
Monocytes Absolute: 0.1 10*3/uL (ref 0.1–1.0)
Monocytes Absolute: 0.3 10*3/uL (ref 0.1–1.0)
Monocytes Relative: 5 %
Monocytes Relative: 6 %
Neutro Abs: 2.1 10*3/uL (ref 1.7–7.7)
Neutro Abs: 4.8 10*3/uL (ref 1.7–7.7)
Neutrophils Relative %: 84 %
Neutrophils Relative %: 85 %
Platelets: 52 10*3/uL — ABNORMAL LOW (ref 150–400)
Platelets: 99 10*3/uL — ABNORMAL LOW (ref 150–400)
RBC: 5.57 MIL/uL — ABNORMAL HIGH (ref 3.87–5.11)
RBC: 3.67 MIL/uL — ABNORMAL LOW (ref 3.87–5.11)
RDW: 16.7 % — ABNORMAL HIGH (ref 11.5–15.5)
RDW: 16.3 % — ABNORMAL HIGH (ref 11.5–15.5)
WBC: 2.6 10*3/uL — ABNORMAL LOW (ref 4.0–10.5)
WBC: 5.6 10*3/uL (ref 4.0–10.5)
nRBC: 0 % (ref 0.0–0.2)
nRBC: 0 % (ref 0.0–0.2)

## 2019-06-04 LAB — COMPREHENSIVE METABOLIC PANEL
ALT: 64 U/L — ABNORMAL HIGH (ref 0–44)
AST: 26 U/L (ref 15–41)
Albumin: 3.2 g/dL — ABNORMAL LOW (ref 3.5–5.0)
Alkaline Phosphatase: 189 U/L — ABNORMAL HIGH (ref 38–126)
Anion gap: 14 (ref 5–15)
BUN: 21 mg/dL — ABNORMAL HIGH (ref 6–20)
CO2: 38 mmol/L — ABNORMAL HIGH (ref 22–32)
Calcium: 8.9 mg/dL (ref 8.9–10.3)
Chloride: 91 mmol/L — ABNORMAL LOW (ref 98–111)
Creatinine, Ser: 0.76 mg/dL (ref 0.44–1.00)
GFR calc Af Amer: >60 mL/min (ref >60)
GFR calc non Af Amer: >60 mL/min (ref >60)
Glucose, Bld: 206 mg/dL — ABNORMAL HIGH (ref 70–99)
Potassium: 3.7 mmol/L (ref 3.5–5.1)
Sodium: 143 mmol/L (ref 135–145)
Total Bilirubin: 0.2 mg/dL — ABNORMAL LOW (ref 0.3–1.2)
Total Protein: 6.6 g/dL (ref 6.5–8.1)

## 2019-06-04 LAB — PROCALCITONIN: Procalcitonin: 0.32 ng/mL

## 2019-06-04 LAB — FERRITIN: Ferritin: 428 ng/mL — ABNORMAL HIGH (ref 11–307)

## 2019-06-04 LAB — TRIGLYCERIDES: Triglycerides: 208 mg/dL — ABNORMAL HIGH (ref <150)

## 2019-06-04 LAB — D-DIMER, QUANTITATIVE: D-Dimer, Quant: 1.09 ug/mL-FEU — ABNORMAL HIGH (ref 0.00–0.50)

## 2019-06-04 LAB — BRAIN NATRIURETIC PEPTIDE: B Natriuretic Peptide: 19.3 pg/mL (ref 0.0–100.0)

## 2019-06-04 LAB — SARS CORONAVIRUS 2 BY RT PCR (HOSPITAL ORDER, PERFORMED IN ~~LOC~~ HOSPITAL LAB): SARS Coronavirus 2: POSITIVE — AB

## 2019-06-04 LAB — LACTIC ACID, PLASMA: Lactic Acid, Venous: 1.2 mmol/L (ref 0.5–1.9)

## 2019-06-04 LAB — CBG MONITORING, ED: Glucose-Capillary: 215 mg/dL — ABNORMAL HIGH (ref 70–99)

## 2019-06-04 LAB — C-REACTIVE PROTEIN: CRP: 17.2 mg/dL — ABNORMAL HIGH (ref <1.0)

## 2019-06-04 LAB — FIBRINOGEN: Fibrinogen: 702 mg/dL — ABNORMAL HIGH (ref 210–475)

## 2019-06-04 LAB — LACTATE DEHYDROGENASE: LDH: 176 U/L (ref 98–192)

## 2019-06-04 MED ORDER — LAMOTRIGINE 25 MG PO TABS
25.0000 mg | ORAL_TABLET | Freq: Every day | ORAL | Status: DC
  Administered 2019-06-05 – 2019-06-16 (×12): 25 mg via ORAL
  Filled 2019-06-04 (×13): qty 1

## 2019-06-04 MED ORDER — PRO-STAT SUGAR FREE PO LIQD
30.0000 mL | Freq: Two times a day (BID) | ORAL | Status: DC
  Administered 2019-06-05 – 2019-06-16 (×23): 30 mL via ORAL
  Filled 2019-06-04 (×22): qty 30

## 2019-06-04 MED ORDER — VANCOMYCIN HCL 10 G IV SOLR
2000.0000 mg | INTRAVENOUS | Status: DC
  Administered 2019-06-04 – 2019-06-05 (×2): 2000 mg via INTRAVENOUS
  Filled 2019-06-04 (×2): qty 2000

## 2019-06-04 MED ORDER — PRAVASTATIN SODIUM 40 MG PO TABS
40.0000 mg | ORAL_TABLET | Freq: Every day | ORAL | Status: DC
  Administered 2019-06-05 – 2019-06-15 (×11): 40 mg via ORAL
  Filled 2019-06-04 (×5): qty 1
  Filled 2019-06-04: qty 2
  Filled 2019-06-04 (×6): qty 1

## 2019-06-04 MED ORDER — SODIUM CHLORIDE 0.9 % IV SOLN
2.0000 g | INTRAVENOUS | Status: DC
  Administered 2019-06-04: 2 g via INTRAVENOUS
  Filled 2019-06-04: qty 20

## 2019-06-04 MED ORDER — SENNA 8.6 MG PO TABS
2.0000 | ORAL_TABLET | Freq: Every day | ORAL | Status: DC
  Administered 2019-06-04 – 2019-06-09 (×6): 17.2 mg via ORAL
  Filled 2019-06-04 (×8): qty 2

## 2019-06-04 MED ORDER — VENLAFAXINE HCL ER 150 MG PO CP24
300.0000 mg | ORAL_CAPSULE | Freq: Every day | ORAL | Status: DC
  Administered 2019-06-05 – 2019-06-16 (×12): 300 mg via ORAL
  Filled 2019-06-04: qty 2
  Filled 2019-06-04: qty 4
  Filled 2019-06-04 (×4): qty 2
  Filled 2019-06-04: qty 4
  Filled 2019-06-04 (×6): qty 2

## 2019-06-04 MED ORDER — TIOTROPIUM BROMIDE MONOHYDRATE 2.5 MCG/ACT IN AERS: 2.0000 | INHALATION_SPRAY | Freq: Every day | RESPIRATORY_TRACT | Status: DC

## 2019-06-04 MED ORDER — PREGABALIN 50 MG PO CAPS
100.0000 mg | ORAL_CAPSULE | Freq: Two times a day (BID) | ORAL | Status: DC
  Administered 2019-06-04: 100 mg via ORAL
  Filled 2019-06-04: qty 2

## 2019-06-04 MED ORDER — SODIUM CHLORIDE 0.9% FLUSH
3.0000 mL | Freq: Two times a day (BID) | INTRAVENOUS | Status: DC
  Administered 2019-06-04 – 2019-06-19 (×23): 3 mL via INTRAVENOUS

## 2019-06-04 MED ORDER — FLUTICASONE FUROATE-VILANTEROL 100-25 MCG/INH IN AEPB
1.0000 | INHALATION_SPRAY | Freq: Every day | RESPIRATORY_TRACT | Status: DC
  Administered 2019-06-05 – 2019-06-19 (×15): 1 via RESPIRATORY_TRACT
  Filled 2019-06-04 (×2): qty 28

## 2019-06-04 MED ORDER — METOPROLOL SUCCINATE ER 25 MG PO TB24
12.5000 mg | ORAL_TABLET | Freq: Every day | ORAL | Status: DC
  Administered 2019-06-05 – 2019-06-16 (×12): 12.5 mg via ORAL
  Filled 2019-06-04 (×13): qty 0.5

## 2019-06-04 MED ORDER — OXYCODONE-ACETAMINOPHEN 10-325 MG PO TABS: 1.0000 | ORAL_TABLET | Freq: Four times a day (QID) | ORAL | Status: DC | PRN

## 2019-06-04 MED ORDER — SODIUM CHLORIDE 0.9 % IV SOLN
500.0000 mg | INTRAVENOUS | Status: DC
  Administered 2019-06-05 – 2019-06-06 (×3): 500 mg via INTRAVENOUS
  Filled 2019-06-04 (×3): qty 500

## 2019-06-04 MED ORDER — OXYCODONE HCL 5 MG PO TABS
5.0000 mg | ORAL_TABLET | Freq: Four times a day (QID) | ORAL | Status: DC | PRN
  Administered 2019-06-04: 5 mg via ORAL
  Filled 2019-06-04: qty 1

## 2019-06-04 MED ORDER — TORSEMIDE 20 MG PO TABS
40.0000 mg | ORAL_TABLET | Freq: Two times a day (BID) | ORAL | Status: DC
  Administered 2019-06-05 – 2019-06-07 (×5): 40 mg via ORAL
  Filled 2019-06-04 (×7): qty 2

## 2019-06-04 MED ORDER — OXYCODONE-ACETAMINOPHEN 5-325 MG PO TABS: 1.0000 | ORAL_TABLET | Freq: Four times a day (QID) | ORAL | Status: DC | PRN

## 2019-06-04 MED ORDER — VENLAFAXINE HCL ER 37.5 MG PO CP24
37.5000 mg | ORAL_CAPSULE | Freq: Every day | ORAL | Status: DC
  Administered 2019-06-05 – 2019-06-16 (×12): 37.5 mg via ORAL
  Filled 2019-06-04 (×13): qty 1

## 2019-06-04 MED ORDER — TRAZODONE HCL 50 MG PO TABS
300.0000 mg | ORAL_TABLET | Freq: Every day | ORAL | Status: DC
  Administered 2019-06-04 – 2019-06-15 (×12): 300 mg via ORAL
  Filled 2019-06-04 (×11): qty 6
  Filled 2019-06-04: qty 3

## 2019-06-04 MED ORDER — INSULIN ASPART 100 UNIT/ML ~~LOC~~ SOLN
0.0000 [IU] | Freq: Every day | SUBCUTANEOUS | Status: DC
  Filled 2019-06-04: qty 0.05

## 2019-06-04 MED ORDER — ALBUTEROL SULFATE (2.5 MG/3ML) 0.083% IN NEBU: 3.0000 mL | INHALATION_SOLUTION | RESPIRATORY_TRACT | Status: DC | PRN

## 2019-06-04 MED ORDER — INSULIN ASPART 100 UNIT/ML ~~LOC~~ SOLN
0.0000 [IU] | Freq: Three times a day (TID) | SUBCUTANEOUS | Status: DC
  Administered 2019-06-05: 7 [IU] via SUBCUTANEOUS
  Administered 2019-06-05: 5 [IU] via SUBCUTANEOUS
  Filled 2019-06-04: qty 0.09

## 2019-06-04 MED ORDER — ARIPIPRAZOLE 2 MG PO TABS
2.0000 mg | ORAL_TABLET | ORAL | Status: DC
  Administered 2019-06-06 – 2019-06-16 (×6): 2 mg via ORAL
  Filled 2019-06-04 (×6): qty 1

## 2019-06-04 MED ORDER — POLYETHYLENE GLYCOL 3350 17 G PO PACK
17.0000 g | PACK | ORAL | Status: DC
  Administered 2019-06-05 – 2019-06-07 (×2): 17 g via ORAL
  Filled 2019-06-04 (×3): qty 1

## 2019-06-04 MED ORDER — METHYLPREDNISOLONE SODIUM SUCC 40 MG IJ SOLR
40.0000 mg | Freq: Three times a day (TID) | INTRAMUSCULAR | Status: DC
  Administered 2019-06-04 – 2019-06-05 (×2): 40 mg via INTRAVENOUS
  Filled 2019-06-04 (×2): qty 1

## 2019-06-04 NOTE — ED Triage Notes (Signed)
Pt to ED via EMS from [place] positive, increase lethargy. Pt o2 sats 60s RA, currently on non rebreather 6L sat low 90s.  Pt A&O X 3. Pt DNR. Pt hx COPD, CHF.

## 2019-06-04 NOTE — ED Notes (Signed)
This nurse unable to obtain IV site on pt d/t difficult stick. IV team consulted. EDP made aware. Awaiting IV team nurse.

## 2019-06-04 NOTE — ED Notes (Signed)
RT at bedside.

## 2019-06-04 NOTE — ED Notes (Signed)
Only able to obtain 1 site for ordered blood cultures d/t difficult stick. 1 set of blood cultures obtained. EDP made aware, No new orders.

## 2019-06-04 NOTE — ED Notes (Signed)
X-ray at bedside

## 2019-06-04 NOTE — H&P (Addendum)
History and Physical    Kathleen Roy YSA:[id number] DOB: October 04, 1961 DOA: 05/18/2019  PCP: [name], MD   Patient coming from: SNF   Chief Complaint: Increasing lethargy, increased O2-requirement   HPI: Kathleen Roy is a 58 y.o. female with medical history significant for COPD, chronic respiratory failure, type 2 diabetes mellitus, hypertension, obstructive sleep apnea, and chronic diastolic CHF, now presenting to the emergency department from a nursing facility for evaluation of worsening lethargy and increased supplemental oxygen requirement.  She has not noted any fevers or chills but reports increased cough and increased sputum production.  She has not had any chest pain or palpitations.  Denies dysuria or flank pain.  Denies nausea, vomiting, or diarrhea.  ED Course: Upon arrival to the ED, patient is found to be afebrile, requiring 8 L/min of supplemental oxygen to maintain saturation in the low 90s, tachypneic, slightly tachycardic, and with blood pressure 102/70.  EKG features sinus rhythm with RBBB, LAFB, and ST abnormalities that appear similar to prior.  Chest x-ray is notable for increased dense airspace consolidation at the right lower lobe.  Chemistry panel is notable for a bicarbonate of 38 and elevated BUN to creatinine ratio.  CBC features a new leukopenia with WBC 2600 and a new thrombocytopenia with platelets 52,000.  D-dimer is elevated to 1.09.  Blood cultures were collected in the ED.  Hospitalists are asked to admit.  Review of Systems:  All other systems reviewed and apart from HPI, are negative.  Past Medical History:  Diagnosis Date  . Arthritis    "I feel like it's everywhere" (09/24/2017  . Cellulitis and abscess of lower extremity 08/16/2016   right leg  . CHF (congestive heart failure) ([place])   . Chronic lower back pain   . COPD (chronic obstructive pulmonary disease) ([place])   . Depression   . Fibromyalgia   . Headache    "once q couple months now"  (09/24/2017)  . Hyperlipidemia   . Hypertension   . HYPERTENSION, BENIGN SYSTEMIC 02/12/2007  . Memory disturbance   . Migraines 1970's - <2000   "they just went away"  . Neuropathy    "bad in my right hand and in both feet" (09/24/2017)  . Normal echocardiogram 05/30/05   suboptimal study  . Obesity, morbid (more than 100 lbs over ideal weight or BMI > 40) ([facility])    obese since childhood  . On home oxygen therapy    "4L; 24/7" (09/24/2017)  . Osteoarthritis   . Peripheral edema   . Pressure ulcer of foot    left  . Sepsis ([place])   . Shortness of breath dyspnea   . Sleep apnea 10/2014   "couldn't tolerate the mask" (09/24/2017)  . Type II diabetes mellitus ([place])     Past Surgical History:  Procedure Laterality Date  . ABDOMINAL HERNIA REPAIR  2002  . APPENDECTOMY  1995  . CHOLECYSTECTOMY OPEN  1995  . HERNIA REPAIR    . TOTAL ABDOMINAL HYSTERECTOMY  1994     reports that she quit smoking about 10 years ago. Her smoking use included cigarettes. She has a 30.00 pack-year smoking history. She has never used smokeless tobacco. She reports that she does not drink alcohol or use drugs.  Allergies  Allergen Reactions  . Aspirin Hives    Family History  Problem Relation Age of Onset  . Stroke Mother   . Dementia Father   . Prostate cancer Father   . Congestive Heart Failure Brother   .  Diabetes Brother   . Hypertension Brother      Prior to Admission medications   Medication Sig Start Date End Date Taking? Authorizing Provider  Amino Acids-Protein Hydrolys (FEEDING SUPPLEMENT, PRO-STAT SUGAR FREE 64,) LIQD Take 30 mLs by mouth 2 (two) times daily.   Yes [provider]  ARIPiprazole (ABILIFY) 2 MG tablet Take 1 tablet (2 mg total) by mouth every other day. 12/18/16  Yes Mayo, Pete Pelt, MD  Ascorbic Acid (VITAMIN C) 1000 MG tablet Take 3,000 mg daily by mouth.    Yes [provider]  Cholecalciferol (VITAMIN D3 PO) Take 1,000 Units by mouth daily.   Yes  [provider]  fexofenadine (ALLEGRA) 180 MG tablet Take 180 mg by mouth daily.   Yes [provider]  fluticasone (FLONASE) 50 MCG/ACT nasal spray Place 1 spray into both nostrils daily.   Yes [provider]  fluticasone furoate-vilanterol (BREO ELLIPTA) 100-25 MCG/INH AEPB Inhale 1 puff daily into the lungs.   Yes [provider]  lamoTRIgine (LAMICTAL) 25 MG tablet Take 25 mg by mouth daily.   Yes [provider]  LYRICA 100 MG capsule Take 100 mg by mouth 2 (two) times a day. 05/11/19  Yes [provider]  metoprolol succinate (TOPROL-XL) 25 MG 24 hr tablet Take 12.5 mg by mouth daily.   Yes [provider]  Multiple Vitamins-Minerals (CERTAGEN PO) Take 1 tablet by mouth daily.   Yes [provider]  nystatin cream (MYCOSTATIN) Apply to affected area 2 times daily 11/19/17  Yes Muthersbaugh, Jarrett Soho, PA-C  Omega-3 Fatty Acids (FISH OIL) 1000 MG CAPS Take 2 capsules (2,000 mg total) by mouth at bedtime. 06/10/16  Yes Mayo, Pete Pelt, MD  oxyCODONE-acetaminophen [facility]) 10-325 MG tablet Take one tablet by mouth every 6 hours as needed for pain 05/23/19  Yes Dessa Phi, DO  polyethylene glycol (MIRALAX / GLYCOLAX) packet Take 17 g by mouth every other day. Mix in 8 oz liquid and drink   Yes [provider]  potassium chloride (K-DUR,KLOR-CON) 10 MEQ tablet Take 40 mEq by mouth at bedtime.   Yes [provider]  pravastatin (PRAVACHOL) 40 MG tablet Take 40 mg by mouth at bedtime.   Yes [provider]  predniSONE (DELTASONE) 10 MG tablet Take 4 tabs for 3 days, then 3 tabs for 3 days, then 2 tabs for 3 days, then 1 tab for 3 days, then 1/2 tab for 4 days. 05/23/19  Yes Dessa Phi, DO  senna (SENOKOT) 8.6 MG TABS tablet Take 2 tablets by mouth at bedtime.   Yes [provider]  Sodium Fluoride (PREVIDENT 5000 PLUS DT) Place 1 application onto teeth daily.   Yes [provider]   Tiotropium Bromide Monohydrate (SPIRIVA RESPIMAT) 2.5 MCG/ACT AERS Inhale 2 puffs into the lungs daily.   Yes [provider]  torsemide (DEMADEX) 20 MG tablet Take 40 mg by mouth 2 (two) times daily.   Yes [provider]  trazodone (DESYREL) 300 MG tablet Take 1 tablet (300 mg total) by mouth at bedtime. 10/07/17  Yes Mayo, Pete Pelt, MD  venlafaxine XR (EFFEXOR-XR) 150 MG 24 hr capsule Take 300 mg by mouth daily. Along with 37.5 to equal 337.5   Yes [provider]  venlafaxine XR (EFFEXOR-XR) 37.5 MG 24 hr capsule Take 37.5 mg by mouth daily. Along with 300mg  to equal 337.5mg    Yes [provider]  cetirizine (ZYRTEC) 10 MG tablet Take 1 tablet (10 mg total)  by mouth daily. Patient not taking: Reported on 01/10/2019 08/15/17   MayoPete Pelt, MD  OXYGEN Inhale 4 L into the lungs continuous.     [provider]    Physical Exam: Vitals:   06/02/2019 1734 05/26/2019 1800 06/10/2019 1830 05/31/2019 1900  BP: 114/77 (!) 108/94 (!) 104/24 102/70  Pulse: 92 98 98 (!) 101  Resp: (!) 22 (!) 25 (!) 25 20  Temp:      TempSrc:      SpO2: 96% 93% 93% 94%  Weight:      Height:        Constitutional: NAD, lethargic  Eyes: PERTLA, lids and conjunctivae normal ENMT: Mucous membranes are moist. Posterior pharynx clear of any exudate or lesions.   Neck: normal, supple, no masses, no thyromegaly Respiratory:  Audible wheezing, frequent productive cough. No accessory muscle use.  Cardiovascular: S1 & S2 heard. regular rate and rhythm. JVP not well-visualized. Abdomen: No distension, no tenderness, soft. Bowel sounds normal.  Musculoskeletal: no clubbing / cyanosis. No joint deformity upper and lower extremities.   Skin: no significant rashes, lesions, ulcers. Warm, dry, well-perfused. Neurologic: Lethargic, easily roused and answers questions appropriately. Moving all extremities.    Psychiatric: Oriented to person, place, and situation. Calm, cooperative.     Labs on Admission: I have personally reviewed following labs and imaging studies  CBC: Recent Labs  Lab 05/18/2019 1700  WBC 2.6*  NEUTROABS 2.1  HGB 16.5*  HCT 56.4*  MCV 101.3*  PLT 52*   Basic Metabolic Panel: Recent Labs  Lab 05/22/2019 1700  NA 143  K 3.7  CL 91*  CO2 38*  GLUCOSE 206*  BUN 21*  CREATININE 0.76  CALCIUM 8.9   GFR: Estimated Creatinine Clearance: 107.9 mL/min (by C-G formula based on SCr of 0.76 mg/dL). Liver Function Tests: Recent Labs  Lab 05/26/2019 1700  AST 26  ALT 64*  ALKPHOS 189*  BILITOT 0.2*  PROT 6.6  ALBUMIN 3.2*   No results for input(s): LIPASE, AMYLASE in the last 168 hours. No results for input(s): AMMONIA in the last 168 hours. Coagulation Profile: No results for input(s): INR, PROTIME in the last 168 hours. Cardiac Enzymes: No results for input(s): CKTOTAL, CKMB, CKMBINDEX, TROPONINI in the last 168 hours. BNP (last 3 results) No results for input(s): PROBNP in the last 8760 hours. HbA1C: No results for input(s): HGBA1C in the last 72 hours. CBG: No results for input(s): GLUCAP in the last 168 hours. Lipid Profile: Recent Labs    05/25/2019 1700  TRIG 208*   Thyroid Function Tests: No results for input(s): TSH, T4TOTAL, FREET4, T3FREE, THYROIDAB in the last 72 hours. Anemia Panel: Recent Labs    05/29/2019 1700  FERRITIN 428*   Urine analysis:    Component Value Date/Time   COLORURINE YELLOW 05/14/2019 2059   APPEARANCEUR HAZY (A) 05/14/2019 2059   LABSPEC 1.012 05/14/2019 2059   PHURINE 6.0 05/14/2019 2059   GLUCOSEU NEGATIVE 05/14/2019 2059   HGBUR SMALL (A) 05/14/2019 2059   BILIRUBINUR NEGATIVE 05/14/2019 2059   [place] 05/14/2019 2059   PROTEINUR 30 (A) 05/14/2019 2059   UROBILINOGEN 0.2 04/11/2013 0749   NITRITE NEGATIVE 05/14/2019 2059   LEUKOCYTESUR NEGATIVE 05/14/2019 2059   Sepsis Labs: @LABRCNTIP (procalcitonin:4,lacticidven:4) )No results found for this or any previous visit (from  the past 240 hour(s)).   Radiological Exams on Admission: Dg Chest Port 1 View  Result Date: 06/07/2019 CLINICAL DATA:  Increasing lethargy. COVID positive. Hypoxic. EXAM: PORTABLE CHEST 1  VIEW COMPARISON:  CT of the chest 05/15/2019 FINDINGS: Cardiomediastinal silhouette is normal. Mediastinal contours appear intact. Increasing dense airspace consolidation in the right lower lung field. No evidence of pneumothorax or pleural effusion. Osseous structures are without acute abnormality. Soft tissues are grossly normal. IMPRESSION: Increasing dense airspace consolidation in the right lower lung field. Electronically Signed   By: Fidela Salisbury M.D.   On: 06/07/2019 17:57    EKG: Independently reviewed. Sinus rhythm, RBBB, LAFB, non-specific ST abnormality, similar to prior.   Assessment/Plan   1. Pneumonia; acute on chronic hypoxic and hypercarbic respiratory failure  - Presents from SNF with progressive lethargy and acute on chronic respiratory failure, found to be saturating as low as 60's on her usual 4 Lpm  - Nursing home staff reported that she was positive for COVID-19 but we don't have any documentation of that  - CXR features a dense consolidation in RLL and procalcitonin 0.36  - Blood cultures collected in ED  - Send rapid COVID-19 swab now, start antibiotics given productive cough and CXR appearance suggestive of bacterial PNA, continue airborne and contact precautions, admit to [place] if COVID-19 is positive and admit here at [facility] if negative    2. COPD with acute exacerbation  - She reports increased cough, increased sputum production, and is wheezing on admission  - She continues to taper down prednisone from recent admission with exacerbation  - Continue ICS/LABA, Spiriva, and albuterol, increase steroids to Solu-Medrol 40 mg IV q8h, continue supplemental O2, antibiotics as above   3. Chronic diastolic CHF  - Appears hypervolemic on admission  - Continue diuretic and  beta-blocker as tolerated, SLIV, follow daily wt and I/O's    4. Hypertension  - BP at goal, continue Toprol as tolerated     5. Type II DM  - A1c was only 5.5% last month and she has been diet-controlled recently  - Serum glucose is 206 on admission  - Follow CBG's and use a low-intensity SSI with Novolog as needed   6. Leukopenia; thrombocytopenia  - WBC and platelets normal on 05/19/19 and now 2,600 and 52k, respectively, with increase in Hgb from 9.0 to 16.5 today  - This could reflect her severe acute infectious illness and hemoconcentration, but will be repeated to confirm   7. Elevated d-dimer  - D-dimer elevation noted, non-specific and likely secondary to her acute infectious illness - Suspicion for PE is low given absence of chest pain or evidence for DVT, and as her worsened hypoxia is explained by PNA seen on CXR    PPE: CAPR, gown, gloves. Patient wearing mask.  DVT prophylaxis: SCD's   Code Status: DNR  Family Communication: Discussed with patient  Consults called: none  Admission status: Inpatient     Vianne Bulls, MD [facility] Pager [phone]  If 7PM-7AM, please contact night-coverage [email or web address] Password [email or web address]  06/07/2019, 7:30 PM

## 2019-06-04 NOTE — Progress Notes (Signed)
Pharmacy Antibiotic Note  Kathleen Roy is a 58 y.o. female admitted on 05/19/2019 with pneumonia.  Pharmacy has been consulted for Vancomycin dosing.  Plan: Vancomycin 2gm IV q24, for AUC 497, using SCr 0.8, Vd 0.5 Rocephin/Azithromycin orders signed/held  Height: 5\' 2"  (157.5 cm) Weight: (!) 320 lb (145.2 kg) IBW/kg (Calculated) : 50.1  Temp (24hrs), Avg:98.3 F (36.8 C), Min:98.3 F (36.8 C), Max:98.3 F (36.8 C)  Recent Labs  Lab 06/05/2019 1630 06/12/2019 1700  WBC  --  2.6*  CREATININE  --  0.76  LATICACIDVEN 1.2  --     Estimated Creatinine Clearance: 107.9 mL/min (by C-G formula based on SCr of 0.76 mg/dL).    Allergies  Allergen Reactions  . Aspirin Hives   Antimicrobials this admission: 6/19 Vanc >>  CTX/Azith orders signed/held  Dose adjustments this admission:  Microbiology results: 6/19 BCx: sent Legionella/Strep pneumo/Sputum orders held  Prev Cx 5/29 MRSA PCR: pos 5/29 & 5/30 Covid: neg 5/30 Resp panel: neg  Thank you for allowing pharmacy to be a part of this patient's care.  Minda Ditto PharmD 06/03/2019 7:52 PM

## 2019-06-04 NOTE — ED Provider Notes (Signed)
[place] DEPT Provider Note   CSN: [id number] Arrival date & time: 05/24/2019  1418     History   Chief Complaint Chief Complaint  Patient presents with  . Shortness of Breath    COVID positive     HPI Kathleen Roy is a 58 y.o. female.  Patient is a nursing home resident at [facility] who chronically is 4L [place] requiring COPD.  She was recently admitted at the end of last month for hypoxia.  Since then she has tested positive for Covid.  She is brought back in from the nursing home for increased oxygen requirement.  She does not think she is had a fever but she has had a cough that is been intermittently productive.  She is also complaining of body aches but she says that is not new for her.  There is been no nausea vomiting or diarrhea in fact she is been constipated.  She has an active DNR.     The history is provided by the patient.  Shortness of Breath Severity:  Moderate Onset quality:  Gradual Timing:  Intermittent Progression:  Worsening Chronicity:  Recurrent Context: activity   Relieved by:  Nothing Worsened by:  Activity Ineffective treatments:  Oxygen Associated symptoms: cough   Associated symptoms: no abdominal pain, no chest pain, no fever, no hemoptysis, no rash, no sore throat, no sputum production, no vomiting and no wheezing   Risk factors: obesity     Past Medical History:  Diagnosis Date  . Arthritis    "I feel like it's everywhere" (09/24/2017  . Cellulitis and abscess of lower extremity 08/16/2016   right leg  . CHF (congestive heart failure) ([place])   . Chronic lower back pain   . COPD (chronic obstructive pulmonary disease) ([place])   . Depression   . Fibromyalgia   . Headache    "once q couple months now" (09/24/2017)  . Hyperlipidemia   . Hypertension   . HYPERTENSION, BENIGN SYSTEMIC 02/12/2007  . Memory disturbance   . Migraines 1970's - <2000   "they just went away"  . Neuropathy    "bad in my right hand and  in both feet" (09/24/2017)  . Normal echocardiogram 05/30/05   suboptimal study  . Obesity, morbid (more than 100 lbs over ideal weight or BMI > 40) (HCC)    obese since childhood  . On home oxygen therapy    "4L; 24/7" (09/24/2017)  . Osteoarthritis   . Peripheral edema   . Pressure ulcer of foot    left  . Sepsis ([place])   . Shortness of breath dyspnea   . Sleep apnea 10/2014   "couldn't tolerate the mask" (09/24/2017)  . Type II diabetes mellitus [facility])     Patient Active Problem List   Diagnosis Date Noted  . Acute on chronic diastolic congestive heart failure ([place])   . Hypernatremia   . Acute on chronic respiratory failure with hypoxia and hypercapnia ([place]) 05/14/2019  . Diabetes mellitus without complication ([place]) 17/40/8144  . Macrocytic anemia 05/14/2019  . Acute metabolic encephalopathy 81/85/6314  . Candidal dermatitis 09/25/2017  . Requires daily assistance for activities of daily living (ADL) and comfort needs   . Weakness 09/24/2017  . Other fatigue   . Altered mental status 09/20/2017  . 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 Allergic rhinitis 08/15/2017  . Immobility 08/15/2017  . Fibromyalgia 06/16/2017  . Chronic pain of both knees   . Acute respiratory failure with hypercapnia ([place]) 10/15/2016  .  Type 2 diabetes mellitus ([place]) 02/14/2016  . Chronic diastolic congestive heart failure ([place])   . Essential hypertension   . OSA (obstructive sleep apnea)   . Obesity hypoventilation syndrome ([place])   . Chronic respiratory failure with hypoxia ([place])   . Hyperglycemia 11/08/2014  . Memory loss 03/18/2014  . Hypokalemia 08/19/2013  . Venous stasis dermatitis of both lower extremities 12/02/2012  . COPD (chronic obstructive pulmonary disease) ([place]) 10/15/2011  . Peripheral neuropathy ([place]) 10/15/2011  . BACK PAIN, LUMBAR, CHRONIC 01/22/2011  . Morbid obesity with BMI of 70 and over, adult ([place]) 11/16/2007  . Major depressive disorder, recurrent episode ([place]) 02/12/2007     Past Surgical History:  Procedure Laterality Date  . ABDOMINAL HERNIA REPAIR  2002  . APPENDECTOMY  1995  . CHOLECYSTECTOMY OPEN  1995  . HERNIA REPAIR    . TOTAL ABDOMINAL HYSTERECTOMY  1994     OB History    Gravida  0   Para  0   Term  0   Preterm  0   AB  0   Living  0     SAB  0   TAB  0   Ectopic  0   Multiple  0   Live Births               Home Medications    Prior to Admission medications   Medication Sig Start Date End Date Taking? Authorizing Provider  albuterol (PROVENTIL HFA;VENTOLIN HFA) 108 (90 Base) MCG/ACT inhaler Inhale 2 puffs into the lungs every 4 (four) hours as needed for wheezing or shortness of breath.    [provider]  Amino Acids-Protein Hydrolys (FEEDING SUPPLEMENT, PRO-STAT SUGAR FREE 64,) LIQD Take 30 mLs by mouth 2 (two) times daily.    [provider]  ARIPiprazole (ABILIFY) 2 MG tablet Take 1 tablet (2 mg total) by mouth every other day. Patient not taking: Reported on 01/10/2019 12/18/16   MayoPete Pelt, MD  Ascorbic Acid (VITAMIN C) 1000 MG tablet Take 3,000 mg daily by mouth.     [provider]  cetirizine (ZYRTEC) 10 MG tablet Take 1 tablet (10 mg total) by mouth daily. Patient not taking: Reported on 01/10/2019 08/15/17   MayoPete Pelt, MD  Cholecalciferol (VITAMIN D3 PO) Take 1,000 Units by mouth daily.    [provider]  fexofenadine (ALLEGRA) 180 MG tablet Take 180 mg by mouth daily.    [provider]  fluticasone (FLONASE) 50 MCG/ACT nasal spray Place 1 spray into both nostrils daily.    [provider]  fluticasone furoate-vilanterol (BREO ELLIPTA) 100-25 MCG/INH AEPB Inhale 1 puff daily into the lungs.    [provider]  lamoTRIgine (LAMICTAL) 25 MG tablet Take 25 mg by mouth daily.    [provider]  metoprolol succinate (TOPROL-XL) 25 MG 24 hr tablet Take 12.5 mg by mouth daily.    [provider]  Multiple Vitamin (MULTIVITAMIN)  LIQD Take 5 mLs by mouth daily.    [provider]  Multiple Vitamins-Minerals (CERTAGEN PO) Take 1 tablet by mouth daily.    [provider]  nystatin cream (MYCOSTATIN) Apply to affected area 2 times daily Patient not taking: Reported on 01/10/2019 11/19/17   Muthersbaugh, Jarrett Soho, PA-C  Omega-3 Fatty Acids (FISH OIL) 1000 MG CAPS Take 2 capsules (2,000 mg total) by mouth at bedtime. 06/10/16   Mayo, Pete Pelt, MD  oxyCODONE-acetaminophen (PERCOCET) 10-325 MG tablet Take one tablet by mouth every 6  hours as needed for pain 05/23/19   Dessa Phi, DO  OXYGEN Inhale 4 L into the lungs continuous.     [provider]  polyethylene glycol (MIRALAX / GLYCOLAX) packet Take 17 g by mouth daily. Mix in 8 oz liquid and drink    [provider]  potassium chloride (K-DUR,KLOR-CON) 10 MEQ tablet Take 40 mEq by mouth at bedtime.    [provider]  pravastatin (PRAVACHOL) 40 MG tablet Take 40 mg by mouth at bedtime.    [provider]  predniSONE (DELTASONE) 10 MG tablet Take 4 tabs for 3 days, then 3 tabs for 3 days, then 2 tabs for 3 days, then 1 tab for 3 days, then 1/2 tab for 4 days. 05/23/19   Dessa Phi, DO  pregabalin (LYRICA) 150 MG capsule Take 1 capsule (150 mg total) by mouth 2 (two) times daily. Patient not taking: Reported on 05/14/2019 07/28/17   Gerlene Fee, NP  senna (SENOKOT) 8.6 MG TABS tablet Take 2 tablets by mouth at bedtime.    [provider]  sodium chloride (OCEAN) 0.65 % SOLN nasal spray Place 2 sprays into both nostrils 2 (two) times daily as needed for congestion (nose bleed/dryness).    [provider]  Sodium Fluoride (PREVIDENT 5000 PLUS DT) Place 1 application onto teeth daily.    [provider]  torsemide (DEMADEX) 20 MG tablet Take 40 mg by mouth 2 (two) times daily.    [provider]  trazodone (DESYREL) 300 MG tablet Take 1 tablet (300 mg total) by mouth at bedtime. Patient not  taking: Reported on 01/10/2019 10/07/17   Sela Hua, MD  venlafaxine XR (EFFEXOR-XR) 150 MG 24 hr capsule Take 300 mg by mouth daily. Along with 37.5 to equal 337.5    [provider]  venlafaxine XR (EFFEXOR-XR) 37.5 MG 24 hr capsule Take 37.5 mg by mouth daily. Along with 300mg  to equal 337.5mg     [provider]    Family History Family History  Problem Relation Age of Onset  . Stroke Mother   . Dementia Father   . Prostate cancer Father   . Congestive Heart Failure Brother   . Diabetes Brother   . Hypertension Brother     Social History Social History   Tobacco Use  . Smoking status: Former Smoker    Packs/day: 1.00    Years: 30.00    Pack years: 30.00    Types: Cigarettes    Quit date: 01/05/2009    Years since quitting: 10.4  . Smokeless tobacco: Never Used  Substance Use Topics  . Alcohol use: No    Alcohol/week: 0.0 standard drinks  . Drug use: No     Allergies   Aspirin   Review of Systems Review of Systems  Constitutional: Positive for fatigue. Negative for fever.  HENT: Negative for sore throat.   Eyes: Negative for visual disturbance.  Respiratory: Positive for cough and shortness of breath. Negative for hemoptysis, sputum production and wheezing.   Cardiovascular: Negative for chest pain.  Gastrointestinal: Positive for constipation. Negative for abdominal pain and vomiting.  Genitourinary: Negative for dysuria.  Musculoskeletal: Positive for myalgias.  Skin: Negative for rash.  Neurological: Negative for syncope.     Physical Exam Updated Vital Signs BP 138/78 (BP Location: Left Arm)   Pulse 93   Temp 98.3 F (36.8 C) (Oral)   Resp 20   Ht 5\' 2"  (1.575 m)   Wt (!) 145.2 kg  SpO2 92%   BMI 58.53 kg/m   Physical Exam Vitals signs and nursing note reviewed.  Constitutional:      General: She is not in acute distress.    Appearance: She is obese.  HENT:     Head: Normocephalic and atraumatic.  Eyes:      Conjunctiva/sclera: Conjunctivae normal.  Neck:     Musculoskeletal: Neck supple.  Cardiovascular:     Rate and Rhythm: Normal rate and regular rhythm.     Heart sounds: No murmur.  Pulmonary:     Effort: Pulmonary effort is normal. No respiratory distress.     Breath sounds: Normal breath sounds.  Abdominal:     Palpations: Abdomen is soft.     Tenderness: There is no abdominal tenderness.  Musculoskeletal:     Right lower leg: Edema present.     Left lower leg: Edema present.     Comments: She has some bruising over her left arm which she says is from IV sticks.  Skin:    General: Skin is warm and dry.     Capillary Refill: Capillary refill takes less than 2 seconds.  Neurological:     General: No focal deficit present.     Mental Status: She is alert.      ED Treatments / Results  Labs (all labs ordered are listed, but only abnormal results are displayed) Labs Reviewed  SARS CORONAVIRUS 2 (HOSPITAL ORDER, [facility]) - Abnormal; Notable for the following components:      Result Value   SARS Coronavirus 2 POSITIVE (*)    All other components within normal limits  CBC WITH DIFFERENTIAL/PLATELET - Abnormal; Notable for the following components:   WBC 2.6 (*)    RBC 5.57 (*)    Hemoglobin 16.5 (*)    HCT 56.4 (*)    MCV 101.3 (*)    MCHC 29.3 (*)    RDW 16.7 (*)    Platelets 52 (*)    Lymphs Abs 0.3 (*)    All other components within normal limits  COMPREHENSIVE METABOLIC PANEL - Abnormal; Notable for the following components:   Chloride 91 (*)    CO2 38 (*)    Glucose, Bld 206 (*)    BUN 21 (*)    Albumin 3.2 (*)    ALT 64 (*)    Alkaline Phosphatase 189 (*)    Total Bilirubin 0.2 (*)    All other components within normal limits  D-DIMER, QUANTITATIVE (NOT AT [facility]) - Abnormal; Notable for the following components:   D-Dimer, Quant 1.09 (*)    All other components within normal limits  FERRITIN - Abnormal; Notable for the following  components:   Ferritin 428 (*)    All other components within normal limits  TRIGLYCERIDES - Abnormal; Notable for the following components:   Triglycerides 208 (*)    All other components within normal limits  FIBRINOGEN - Abnormal; Notable for the following components:   Fibrinogen 702 (*)    All other components within normal limits  C-REACTIVE PROTEIN - Abnormal; Notable for the following components:   CRP 17.2 (*)    All other components within normal limits  CBC WITH DIFFERENTIAL/PLATELET - Abnormal; Notable for the following components:   RBC 3.67 (*)    Hemoglobin 10.9 (*)    MCV 102.2 (*)    MCHC 29.1 (*)    RDW 16.3 (*)    Platelets 99 (*)    Lymphs Abs 0.5 (*)  All other components within normal limits  GLUCOSE, CAPILLARY - Abnormal; Notable for the following components:   Glucose-Capillary 276 (*)    All other components within normal limits  CBG MONITORING, ED - Abnormal; Notable for the following components:   Glucose-Capillary 215 (*)    All other components within normal limits  CBG MONITORING, ED - Abnormal; Notable for the following components:   Glucose-Capillary 222 (*)    All other components within normal limits  CULTURE, BLOOD (ROUTINE X 2)  CULTURE, BLOOD (ROUTINE X 2)  EXPECTORATED SPUTUM ASSESSMENT W REFEX TO RESP CULTURE  LACTIC ACID, PLASMA  PROCALCITONIN  LACTATE DEHYDROGENASE  BRAIN NATRIURETIC PEPTIDE  LEGIONELLA PNEUMOPHILA SEROGP 1 UR AG  STREP PNEUMONIAE URINARY ANTIGEN    EKG EKG Interpretation  Date/Time:  Friday June 04 2019 14:48:36 EDT Ventricular Rate:  93 PR Interval:    QRS Duration: 139 QT Interval:  383 QTC Calculation: 477 R Axis:   -96 Text Interpretation:  Sinus rhythm RBBB and LAFB Consider left ventricular hypertrophy ST elevation, consider inferior injury similar to prior 5/20 Confirmed by Aletta Edouard [phone]) on 06/08/2019 3:48:11 PM   Radiology Dg Chest Port 1 View  Result Date: 06/02/2019 CLINICAL DATA:   Increasing lethargy. COVID positive. Hypoxic. EXAM: PORTABLE CHEST 1 VIEW COMPARISON:  CT of the chest 05/15/2019 FINDINGS: Cardiomediastinal silhouette is normal. Mediastinal contours appear intact. Increasing dense airspace consolidation in the right lower lung field. No evidence of pneumothorax or pleural effusion. Osseous structures are without acute abnormality. Soft tissues are grossly normal. IMPRESSION: Increasing dense airspace consolidation in the right lower lung field. Electronically Signed   By: Fidela Salisbury M.D.   On: 06/08/2019 17:57    Procedures .Critical Care Performed by: Hayden Rasmussen, MD Authorized by: Hayden Rasmussen, MD   Critical care provider statement:    Critical care time (minutes):  45   Critical care time was exclusive of:  Separately billable procedures and treating other patients   Critical care was necessary to treat or prevent imminent or life-threatening deterioration of the following conditions:  Respiratory failure   Critical care was time spent personally by me on the following activities:  Discussions with consultants, evaluation of patient's response to treatment, examination of patient, ordering and performing treatments and interventions, ordering and review of laboratory studies, ordering and review of radiographic studies, pulse oximetry, re-evaluation of patient's condition, obtaining history from patient or surrogate, review of old charts and development of treatment plan with patient or surrogate   I assumed direction of critical care for this patient from another provider in my specialty: no     (including critical care time)  Medications Ordered in ED Medications  pravastatin (PRAVACHOL) tablet 40 mg (has no administration in time range)  ARIPiprazole (ABILIFY) tablet 2 mg (has no administration in time range)  traZODone (DESYREL) tablet 300 mg (300 mg Oral Given 05/18/2019 2315)  venlafaxine XR (EFFEXOR-XR) 24 hr capsule 300 mg (300 mg  Oral Given 06/05/19 0843)  venlafaxine XR (EFFEXOR-XR) 24 hr capsule 37.5 mg (37.5 mg Oral Given 06/05/19 0845)  polyethylene glycol (MIRALAX / GLYCOLAX) packet 17 g (17 g Oral Given 06/05/19 0842)  senna (SENOKOT) tablet 17.2 mg (17.2 mg Oral Given 05/23/2019 2319)  lamoTRIgine (LAMICTAL) tablet 25 mg (25 mg Oral Given 06/05/19 0849)  feeding supplement (PRO-STAT SUGAR FREE 64) liquid 30 mL (30 mLs Oral Given 06/05/19 0843)  fluticasone furoate-vilanterol (BREO ELLIPTA) 100-25 MCG/INH 1 puff (1 puff Inhalation Given 06/05/19 0848)  azithromycin (ZITHROMAX) 500 mg in sodium chloride 0.9 % 250 mL IVPB (0 mg Intravenous Stopped 06/05/19 0246)  insulin aspart (novoLOG) injection 0-9 Units (5 Units Subcutaneous Given 06/05/19 0842)  metoprolol succinate (TOPROL-XL) 24 hr tablet 12.5 mg (12.5 mg Oral Given 06/05/19 0846)  methylPREDNISolone sodium succinate (SOLU-MEDROL) 40 mg/mL injection 40 mg (40 mg Intravenous Given 06/05/19 0624)  albuterol (PROVENTIL) (2.5 MG/3ML) 0.083% nebulizer solution 3 mL (has no administration in time range)  vancomycin (VANCOCIN) 2,000 mg in sodium chloride 0.9 % 500 mL IVPB ( Intravenous Stopped 06/10/2019 2256)  torsemide (DEMADEX) tablet 40 mg (40 mg Oral Given 06/05/19 0846)  sodium chloride flush (NS) 0.9 % injection 3 mL (3 mLs Intravenous Given 06/05/19 0850)  insulin aspart (novoLOG) injection 0-5 Units (2 Units Subcutaneous Given 06/05/19 0509)  umeclidinium bromide (INCRUSE ELLIPTA) 62.5 MCG/INH 1 puff (1 puff Inhalation Given 06/05/19 0848)  pregabalin (LYRICA) capsule 100 mg (100 mg Oral Given 06/05/19 0842)  Ampicillin-Sulbactam (UNASYN) 3 g in sodium chloride 0.9 % 100 mL IVPB (has no administration in time range)  acetaminophen (TYLENOL) tablet 650 mg (650 mg Oral Given 06/05/19 0842)  oxyCODONE (Oxy IR/ROXICODONE) immediate release tablet 5 mg (has no administration in time range)  enoxaparin (LOVENOX) injection 70 mg (70 mg Subcutaneous Given 06/05/19 0845)     Initial  Impression / Assessment and Plan / ED Course  I have reviewed the triage vital signs and the nursing notes.  Pertinent labs & imaging results that were available during my care of the patient were reviewed by me and considered in my medical decision making (see chart for details).  Clinical Course as of Jun 05 923  Fri Jun 03, 3972  1049 58 year old female with significant COPD and CHF requiring 4 L nasal cannula oxygen here after having an increased O2 requirement and known Covid positive.  Differential includes pneumonia, CHF, COPD, Covid pneumonia, sepsis.   [MB]  0347 Received a call from the nurse from the facility.  This at her baseline is somewhat lethargic and she has a habit of retaining CO2 and is noncompliant with her CPAP.  She has baseline 4 L nasal cannula but has been requiring 10 L to keep her sats up.  She is Covid positive from testing that had done.   [MB]  1926 Discussed with Dr. Myna Hidalgo from the hospitalist service who will admit the patient to his service.  He asked if the patient could have a repeat Covid test because they [facility] will not take her without a lab confirmed positive.   [MB]    Clinical Course User Index [MB] Hayden Rasmussen, MD   MADALAINE PORTIER was evaluated in Emergency Department on 06/11/2019 for the symptoms described in the history of present illness. She was evaluated in the context of the global COVID-19 pandemic, which necessitated consideration that the patient might be at risk for infection with the SARS-CoV-2 virus that causes COVID-19. Institutional protocols and algorithms that pertain to the evaluation of patients at risk for COVID-19 are in a state of rapid change based on information released by regulatory bodies including the CDC and federal and state organizations. These policies and algorithms were followed during the patient's care in the ED.     Final Clinical Impressions(s) / ED Diagnoses   Final diagnoses:  Pneumonia of  right lower lobe due to infectious organism [facility])  COVID-19  Acute on chronic respiratory failure with hypoxia and hypercapnia [facility])    ED Discharge Orders  None       Hayden Rasmussen, MD 06/05/19 [phone]

## 2019-06-04 NOTE — ED Notes (Signed)
Bed: CB63 Expected date:  Expected time:  Means of arrival:  Comments: EMS-COVID pos

## 2019-06-05 ENCOUNTER — Inpatient Hospital Stay: Payer: Self-pay

## 2019-06-05 DIAGNOSIS — U071 COVID-19: Secondary | ICD-10-CM

## 2019-06-05 DIAGNOSIS — J069 Acute upper respiratory infection, unspecified: Secondary | ICD-10-CM

## 2019-06-05 LAB — SAMPLE TO BLOOD BANK

## 2019-06-05 LAB — CBC WITH DIFFERENTIAL/PLATELET
Abs Immature Granulocytes: 0.03 10*3/uL (ref 0.00–0.07)
Basophils Absolute: 0 10*3/uL (ref 0.0–0.1)
Basophils Relative: 0 %
Eosinophils Absolute: 0 10*3/uL (ref 0.0–0.5)
Eosinophils Relative: 0 %
HCT: 30.9 % — ABNORMAL LOW (ref 36.0–46.0)
Hemoglobin: 8.9 g/dL — ABNORMAL LOW (ref 12.0–15.0)
Immature Granulocytes: 1 %
Lymphocytes Relative: 5 %
Lymphs Abs: 0.2 10*3/uL — ABNORMAL LOW (ref 0.7–4.0)
MCH: 28.8 pg (ref 26.0–34.0)
MCHC: 28.8 g/dL — ABNORMAL LOW (ref 30.0–36.0)
MCV: 100 fL (ref 80.0–100.0)
Monocytes Absolute: 0.1 10*3/uL (ref 0.1–1.0)
Monocytes Relative: 2 %
Neutro Abs: 4.3 10*3/uL (ref 1.7–7.7)
Neutrophils Relative %: 92 %
Platelets: 85 10*3/uL — ABNORMAL LOW (ref 150–400)
RBC: 3.09 MIL/uL — ABNORMAL LOW (ref 3.87–5.11)
RDW: 16.1 % — ABNORMAL HIGH (ref 11.5–15.5)
WBC: 4.6 10*3/uL (ref 4.0–10.5)
nRBC: 0 % (ref 0.0–0.2)

## 2019-06-05 LAB — BASIC METABOLIC PANEL
Anion gap: 14 (ref 5–15)
BUN: 27 mg/dL — ABNORMAL HIGH (ref 6–20)
CO2: 36 mmol/L — ABNORMAL HIGH (ref 22–32)
Calcium: 8.1 mg/dL — ABNORMAL LOW (ref 8.9–10.3)
Chloride: 88 mmol/L — ABNORMAL LOW (ref 98–111)
Creatinine, Ser: 0.83 mg/dL (ref 0.44–1.00)
GFR calc Af Amer: >60 mL/min (ref >60)
GFR calc non Af Amer: >60 mL/min (ref >60)
Glucose, Bld: 468 mg/dL — ABNORMAL HIGH (ref 70–99)
Potassium: 3.4 mmol/L — ABNORMAL LOW (ref 3.5–5.1)
Sodium: 138 mmol/L (ref 135–145)

## 2019-06-05 LAB — COMPREHENSIVE METABOLIC PANEL
ALT: 43 U/L (ref 0–44)
AST: 21 U/L (ref 15–41)
Albumin: 2.7 g/dL — ABNORMAL LOW (ref 3.5–5.0)
Alkaline Phosphatase: 135 U/L — ABNORMAL HIGH (ref 38–126)
Anion gap: 11 (ref 5–15)
BUN: 24 mg/dL — ABNORMAL HIGH (ref 6–20)
CO2: 37 mmol/L — ABNORMAL HIGH (ref 22–32)
Calcium: 8.1 mg/dL — ABNORMAL LOW (ref 8.9–10.3)
Chloride: 90 mmol/L — ABNORMAL LOW (ref 98–111)
Creatinine, Ser: 0.82 mg/dL (ref 0.44–1.00)
GFR calc Af Amer: >60 mL/min (ref >60)
GFR calc non Af Amer: >60 mL/min (ref >60)
Glucose, Bld: 447 mg/dL — ABNORMAL HIGH (ref 70–99)
Potassium: 3.7 mmol/L (ref 3.5–5.1)
Sodium: 138 mmol/L (ref 135–145)
Total Bilirubin: 0.3 mg/dL (ref 0.3–1.2)
Total Protein: 5.8 g/dL — ABNORMAL LOW (ref 6.5–8.1)

## 2019-06-05 LAB — C-REACTIVE PROTEIN: CRP: 22.4 mg/dL — ABNORMAL HIGH (ref <1.0)

## 2019-06-05 LAB — GLUCOSE, CAPILLARY
Glucose-Capillary: 276 mg/dL — ABNORMAL HIGH (ref 70–99)
Glucose-Capillary: 305 mg/dL — ABNORMAL HIGH (ref 70–99)
Glucose-Capillary: 462 mg/dL — ABNORMAL HIGH (ref 70–99)
Glucose-Capillary: 408 mg/dL — ABNORMAL HIGH (ref 70–99)

## 2019-06-05 LAB — EXPECTORATED SPUTUM ASSESSMENT W GRAM STAIN, RFLX TO RESP C

## 2019-06-05 LAB — CBG MONITORING, ED: Glucose-Capillary: 222 mg/dL — ABNORMAL HIGH (ref 70–99)

## 2019-06-05 LAB — STREP PNEUMONIAE URINARY ANTIGEN: Strep Pneumo Urinary Antigen: NEGATIVE

## 2019-06-05 LAB — D-DIMER, QUANTITATIVE: D-Dimer, Quant: 1.21 ug/mL-FEU — ABNORMAL HIGH (ref 0.00–0.50)

## 2019-06-05 LAB — CK: Total CK: 143 U/L (ref 38–234)

## 2019-06-05 LAB — FERRITIN: Ferritin: 629 ng/mL — ABNORMAL HIGH (ref 11–307)

## 2019-06-05 MED ORDER — METHYLPREDNISOLONE SODIUM SUCC 125 MG IJ SOLR
80.0000 mg | Freq: Once | INTRAMUSCULAR | Status: AC
  Administered 2019-06-05: 80 mg via INTRAVENOUS
  Filled 2019-06-05: qty 2

## 2019-06-05 MED ORDER — NALOXONE HCL 0.4 MG/ML IJ SOLN
INTRAMUSCULAR | Status: AC
  Administered 2019-06-05: 12:00:00
  Filled 2019-06-05: qty 1

## 2019-06-05 MED ORDER — FUROSEMIDE 10 MG/ML IJ SOLN
80.0000 mg | Freq: Once | INTRAMUSCULAR | Status: AC
  Administered 2019-06-05: 10:00:00 80 mg via INTRAVENOUS
  Filled 2019-06-05: qty 8

## 2019-06-05 MED ORDER — NALOXONE HCL 0.4 MG/ML IJ SOLN
1.0000 mg | Freq: Once | INTRAMUSCULAR | Status: AC
  Administered 2019-06-05: 1 mg via INTRAVENOUS
  Filled 2019-06-05: qty 3

## 2019-06-05 MED ORDER — NALOXONE HCL 2 MG/2ML IJ SOSY: 1.0000 mg | PREFILLED_SYRINGE | Freq: Once | INTRAMUSCULAR | Status: DC

## 2019-06-05 MED ORDER — SODIUM CHLORIDE 0.9 % IV SOLN
100.0000 mg | INTRAVENOUS | Status: AC
  Administered 2019-06-06 – 2019-06-09 (×4): 100 mg via INTRAVENOUS
  Filled 2019-06-05 (×4): qty 20

## 2019-06-05 MED ORDER — INSULIN ASPART 100 UNIT/ML ~~LOC~~ SOLN
0.0000 [IU] | Freq: Every day | SUBCUTANEOUS | Status: DC
  Administered 2019-06-05 – 2019-06-06 (×2): 5 [IU] via SUBCUTANEOUS
  Administered 2019-06-07: 4 [IU] via SUBCUTANEOUS
  Administered 2019-06-08: 3 [IU] via SUBCUTANEOUS
  Administered 2019-06-09: 4 [IU] via SUBCUTANEOUS
  Administered 2019-06-10: 2 [IU] via SUBCUTANEOUS
  Administered 2019-06-11: 3 [IU] via SUBCUTANEOUS
  Administered 2019-06-12 – 2019-06-13 (×2): 2 [IU] via SUBCUTANEOUS
  Administered 2019-06-14: 3 [IU] via SUBCUTANEOUS

## 2019-06-05 MED ORDER — METHYLPREDNISOLONE SODIUM SUCC 125 MG IJ SOLR
80.0000 mg | Freq: Three times a day (TID) | INTRAMUSCULAR | Status: DC
  Administered 2019-06-05 – 2019-06-06 (×3): 80 mg via INTRAVENOUS
  Filled 2019-06-05 (×3): qty 2

## 2019-06-05 MED ORDER — SODIUM CHLORIDE 0.9 % IV SOLN
200.0000 mg | Freq: Once | INTRAVENOUS | Status: AC
  Administered 2019-06-05: 200 mg via INTRAVENOUS
  Filled 2019-06-05: qty 40

## 2019-06-05 MED ORDER — OXYCODONE HCL 5 MG PO TABS: 5.0000 mg | ORAL_TABLET | Freq: Four times a day (QID) | ORAL | Status: DC | PRN

## 2019-06-05 MED ORDER — FUROSEMIDE 10 MG/ML IJ SOLN
80.0000 mg | Freq: Three times a day (TID) | INTRAMUSCULAR | Status: DC
  Administered 2019-06-05 – 2019-06-07 (×7): 80 mg via INTRAVENOUS
  Filled 2019-06-05 (×8): qty 8

## 2019-06-05 MED ORDER — NALOXONE HCL 0.4 MG/ML IJ SOLN
INTRAMUSCULAR | Status: AC
  Filled 2019-06-05: qty 1

## 2019-06-05 MED ORDER — ACETAMINOPHEN 325 MG PO TABS
650.0000 mg | ORAL_TABLET | Freq: Four times a day (QID) | ORAL | Status: DC | PRN
  Administered 2019-06-05 – 2019-06-16 (×11): 650 mg via ORAL
  Filled 2019-06-05 (×11): qty 2

## 2019-06-05 MED ORDER — UMECLIDINIUM BROMIDE 62.5 MCG/INH IN AEPB
1.0000 | INHALATION_SPRAY | Freq: Every day | RESPIRATORY_TRACT | Status: DC
  Administered 2019-06-05 – 2019-06-19 (×15): 1 via RESPIRATORY_TRACT
  Filled 2019-06-05 (×3): qty 7

## 2019-06-05 MED ORDER — SODIUM CHLORIDE 0.9% FLUSH
10.0000 mL | Freq: Two times a day (BID) | INTRAVENOUS | Status: DC
  Administered 2019-06-06: 10 mL
  Administered 2019-06-09: 30 mL
  Administered 2019-06-09 – 2019-06-16 (×14): 10 mL
  Administered 2019-06-17: 10:00:00 20 mL
  Administered 2019-06-18 – 2019-06-19 (×3): 10 mL

## 2019-06-05 MED ORDER — NALOXONE HCL 2 MG/2ML IJ SOSY
1.0000 mg | PREFILLED_SYRINGE | Freq: Once | INTRAMUSCULAR | Status: DC
  Filled 2019-06-05: qty 2

## 2019-06-05 MED ORDER — SODIUM CHLORIDE 0.9 % IV SOLN
3.0000 g | Freq: Four times a day (QID) | INTRAVENOUS | Status: DC
  Administered 2019-06-05 – 2019-06-08 (×11): 3 g via INTRAVENOUS
  Filled 2019-06-05 (×13): qty 3

## 2019-06-05 MED ORDER — INSULIN ASPART 100 UNIT/ML ~~LOC~~ SOLN
0.0000 [IU] | Freq: Three times a day (TID) | SUBCUTANEOUS | Status: DC
  Administered 2019-06-05 – 2019-06-06 (×3): 20 [IU] via SUBCUTANEOUS
  Administered 2019-06-06: 4 [IU] via SUBCUTANEOUS
  Administered 2019-06-07: 11 [IU] via SUBCUTANEOUS
  Administered 2019-06-07: 4 [IU] via SUBCUTANEOUS
  Administered 2019-06-07 – 2019-06-08 (×2): 20 [IU] via SUBCUTANEOUS
  Administered 2019-06-08: 15 [IU] via SUBCUTANEOUS
  Administered 2019-06-08 – 2019-06-09 (×3): 20 [IU] via SUBCUTANEOUS
  Administered 2019-06-09: 11 [IU] via SUBCUTANEOUS
  Administered 2019-06-10: 4 [IU] via SUBCUTANEOUS
  Administered 2019-06-10: 15 [IU] via SUBCUTANEOUS
  Administered 2019-06-10: 11 [IU] via SUBCUTANEOUS
  Administered 2019-06-11: 7 [IU] via SUBCUTANEOUS
  Administered 2019-06-11: 20 [IU] via SUBCUTANEOUS
  Administered 2019-06-12: 11 [IU] via SUBCUTANEOUS
  Administered 2019-06-12: 7 [IU] via SUBCUTANEOUS
  Administered 2019-06-13: 15 [IU] via SUBCUTANEOUS
  Administered 2019-06-13: 11 [IU] via SUBCUTANEOUS
  Administered 2019-06-14: 09:00:00 3 [IU] via SUBCUTANEOUS
  Administered 2019-06-14: 15 [IU] via SUBCUTANEOUS
  Administered 2019-06-15: 3 [IU] via SUBCUTANEOUS
  Administered 2019-06-15: 7 [IU] via SUBCUTANEOUS
  Administered 2019-06-16 (×2): 3 [IU] via SUBCUTANEOUS

## 2019-06-05 MED ORDER — INSULIN DETEMIR 100 UNIT/ML ~~LOC~~ SOLN
8.0000 [IU] | Freq: Two times a day (BID) | SUBCUTANEOUS | Status: DC
  Administered 2019-06-05 (×2): 8 [IU] via SUBCUTANEOUS
  Filled 2019-06-05 (×3): qty 0.08

## 2019-06-05 MED ORDER — INSULIN ASPART 100 UNIT/ML ~~LOC~~ SOLN
0.0000 [IU] | Freq: Every day | SUBCUTANEOUS | Status: DC
  Administered 2019-06-05: 2 [IU] via SUBCUTANEOUS
  Filled 2019-06-05: qty 0.05

## 2019-06-05 MED ORDER — POTASSIUM CHLORIDE CRYS ER 20 MEQ PO TBCR
40.0000 meq | EXTENDED_RELEASE_TABLET | Freq: Once | ORAL | Status: AC
  Administered 2019-06-05: 40 meq via ORAL
  Filled 2019-06-05: qty 2

## 2019-06-05 MED ORDER — PREGABALIN 50 MG PO CAPS
100.0000 mg | ORAL_CAPSULE | Freq: Two times a day (BID) | ORAL | Status: DC
  Administered 2019-06-05 – 2019-06-16 (×23): 100 mg via ORAL
  Filled 2019-06-05 (×23): qty 2

## 2019-06-05 MED ORDER — INSULIN ASPART 100 UNIT/ML ~~LOC~~ SOLN
15.0000 [IU] | Freq: Once | SUBCUTANEOUS | Status: AC
  Administered 2019-06-05: 15 [IU] via SUBCUTANEOUS

## 2019-06-05 MED ORDER — SODIUM CHLORIDE 0.9% FLUSH: 10.0000 mL | INTRAVENOUS | Status: DC | PRN

## 2019-06-05 MED ORDER — ENOXAPARIN SODIUM 80 MG/0.8ML ~~LOC~~ SOLN
70.0000 mg | Freq: Every day | SUBCUTANEOUS | Status: DC
  Administered 2019-06-05 – 2019-06-07 (×3): 70 mg via SUBCUTANEOUS
  Filled 2019-06-05 (×3): qty 0.8

## 2019-06-05 NOTE — ED Notes (Signed)
Patient being transported by [facility] at this time.

## 2019-06-05 NOTE — ED Notes (Signed)
Phone report given to [place], Therapist, sports at [facility].  [facility] called for transportation.

## 2019-06-05 NOTE — Progress Notes (Signed)
Peripherally Inserted Central Catheter/Midline Placement  The IV Nurse has discussed with the patient and/or persons authorized to consent for the patient, the purpose of this procedure and the potential benefits and risks involved with this procedure.  The benefits include less needle sticks, lab draws from the catheter, and the patient may be discharged home with the catheter. Risks include, but not limited to, infection, bleeding, blood clot (thrombus formation), and puncture of an artery; nerve damage and irregular heartbeat and possibility to perform a PICC exchange if needed/ordered by physician.  Alternatives to this procedure were also discussed.  Bard Power PICC patient education guide, fact sheet on infection prevention and patient information card has been provided to patient /or left at bedside.    PICC/Midline Placement Documentation        Darlyn Read 06/05/2019, 4:39 PM

## 2019-06-05 NOTE — ED Notes (Signed)
Dr. Myna Hidalgo aware of patients o2 sats at this time. No new orders.

## 2019-06-05 NOTE — Progress Notes (Signed)
PROGRESS NOTE                                                                                                                                                                                                             Patient Demographics:    Kathleen Roy, is a 58 y.o. female, DOB - [birth year], WGN:[id number]  Admit date - 06/11/2019   Admitting Physician Vianne Bulls, MD  Outpatient Primary MD for the patient is [name], MD  LOS - 1   Chief Complaint  Patient presents with   Shortness of Breath    COVID positive        Brief Narrative      Subjective:    Latina Frank today significantly altered, with increased work of breathing, high oxygen requirement, on 10 L high flow nasal cannula and nonrebreather.   Assessment  & Plan :    Principal Problem:   Pneumonia Active Problems:   Major depressive disorder, recurrent episode (HCC)   COPD (chronic obstructive pulmonary disease) (HCC)   OSA (obstructive sleep apnea)   Obesity hypoventilation syndrome (HCC)   Essential hypertension   Type 2 diabetes mellitus (HCC)   Chronic pain of both knees   Acute on chronic respiratory failure with hypoxia and hypercapnia (HCC)   Acute respiratory disease due to COVID-19 virus   Sepsis -Sepsis present on admission, febrile, tachypneic, tachycardic, with increased work of breathing, and encephalopathy. -She is most likely related to pneumonia, questionable aspiration, and COVID-19 infection. -Sent with dense right lung base opacity, highly suspicious for aspiration pneumonia, as well she was encephalopathic on presentation, empirically on azithromycin, Rocephin and vancomycin, I have switched Rocephin to Unasyn to cover for aspiration empirically, will try to obtain sputum cultures and adjust antibiotics as needed.  Acute on chronic hypoxic respiratory failure/hypercapnic respiratory failure -Baseline on 4 L nasal cannula, this morning she is on  10 L high flow+ NRB -Respiratory failure appears to be multifactorial, in the setting of aspiration pneumonia, significant volume overload , COPD exacerbation and COVID-19 infection. -Recurrent respiratory distress this morning, she is DNR, no intubation, ABG showing hypercapnia, but appears to be compensated, so we will hold on initiating BiPAP.  COVID 19 infection -Elevated inflammatory markers, with worsening respiratory failure, respiratory failure secondary to bacterial pneumonia and COVID-19 infection, given how frail she and at  risk for rapid decompensation, she will be started on Remdesivir, will continue with IV Solu-Medrol, elevated procalcitonin, and possible aspiration pneumonia we will not give Actemra.  COPD with acute exacerbation  - She reports increased cough, increased sputum production, and wheezing. -Continue with IV Solu-Medrol, giving her tenuous respiratory status and have increased her Solu-Medrol to 80 mg IV every 8 hours. - Continue ICS/LABA, Spiriva, and albuterol, increase steroids to Solu-Medrol 40 mg IV q8h, continue supplemental O2, antibiotics as above  -Cussed with staff, will keep O2 saturation between 88 to 92%.  Acute on Chronic diastolic CHF  -With significant volume overload currently, even though Breeback BNP within normal limit, she is significantly hypervolemic on physical exam, she is on torsemide 40 twice daily at home, I will start on IV Lasix 80 mg every 8 hours, monitor volume status closely, daily weights, will monitor electrolytes closely as she is on aggressive IV diuresis.  Acute metabolic encephalopathy -Patient significantly somnolent this morning, wakes up only to loud verbal stimuli, have stopped her pain regimen, she received Narcan 1 mg IV x2 with transient improvement, as well ABG with hypercapnia, have discontinued her pain medicine, discussed with nursing staff, will avoid overcorrection with her hypoxia.    Hypertension  - BP at goal,  discontinue Toprol as blood pressure on the lower side after diuresis  Type II DM  - A1c was only 5.5% last month and she has been diet-controlled recently  - Have changed her insulin sliding scale intensity, as well added low-dose Lantus as she is on steroids.   Leukopenia; thrombocytopenia  - Due to COVID-19 infection, platelet count has improved today, will start on DVT prophylaxis.  Elevated d-dimer  -This is secondary to COVID-19, will trend, she is on DVT prophylaxis    COVID-19 Labs  Recent Labs    06/06/2019 1700  DDIMER 1.09*  FERRITIN 428*  LDH 176  CRP 17.2*    Lab Results  Component Value Date   SARSCOV2NAA POSITIVE (A) 06/11/2019   [place] NEGATIVE 05/22/2019   [place] NEGATIVE 05/15/2019   [place] NEGATIVE 05/14/2019     Code Status : DNR   Family Communication  : To update her friend [name] left a voicemail  Disposition Plan  : remains in PCU  Consults  :  none  Procedures  : None   DVT Prophylaxis  :  [place] lovenox  Lab Results  Component Value Date   PLT 99 (L) 06/14/2019    Antibiotics  :    Anti-infectives (From admission, onward)   Start     Dose/Rate Route Frequency Ordered Stop   06/06/19 1200  remdesivir 100 mg in sodium chloride 0.9 % 250 mL IVPB     100 mg 500 mL/hr over 30 Minutes Intravenous Every 24 hours 06/05/19 1003 06/10/19 1159   06/05/19 1200  Ampicillin-Sulbactam (UNASYN) 3 g in sodium chloride 0.9 % 100 mL IVPB     3 g 200 mL/hr over 30 Minutes Intravenous Every 6 hours 06/05/19 0812     06/05/19 1200  remdesivir 200 mg in sodium chloride 0.9 % 250 mL IVPB     200 mg 500 mL/hr over 30 Minutes Intravenous Once 06/05/19 1003     05/30/2019 2200  cefTRIAXone (ROCEPHIN) 2 g in sodium chloride 0.9 % 100 mL IVPB  Status:  Discontinued     2 g 200 mL/hr over 30 Minutes Intravenous Every 24 hours 05/18/2019 2118 06/05/19 0741   06/02/2019 2200  azithromycin (ZITHROMAX) 500 mg in  sodium chloride 0.9 % 250 mL IVPB      500 mg 250 mL/hr over 60 Minutes Intravenous Every 24 hours 06/14/2019 2118 06/09/19 2159   06/03/2019 2000  vancomycin (VANCOCIN) 2,000 mg in sodium chloride 0.9 % 500 mL IVPB     2,000 mg 250 mL/hr over 120 Minutes Intravenous Every 24 hours 06/01/2019 1955          Objective:   Vitals:   06/05/19 0747 06/05/19 0800 06/05/19 1054 06/05/19 1138  BP: (!) 152/77 (!) 156/71    Pulse:  (!) 111    Resp:  (!) 24    Temp: (!) 102.2 F (39 C)  (!) 100.7 F (38.2 C) 100 F (37.8 C)  TempSrc: Axillary  Axillary Axillary  SpO2:  (!) 88%    Weight:      Height:        Wt Readings from Last 3 Encounters:  06/07/2019 (!) 145.2 kg  05/23/19 (!) 144.2 kg  01/10/19 (!) 163.3 kg     Intake/Output Summary (Last 24 hours) at 06/05/2019 1310 Last data filed at 06/05/2019 1203 Gross per 24 hour  Intake 1225.84 ml  Output 650 ml  Net 575.84 ml     Physical Exam  Billy obese female, somnolent, wakes up to painful stimuli, answering questions appropriately and then go back to sleep . Diminished air entry at the bases, with diffuse wheezing, bibasilar crackles and use of accessory muscles RRR,No Gallops,Rubs or new Murmurs, No Parasternal Heave +ve B.Sounds, Abd Soft, No tenderness,  No rebound - guarding or rigidity. No Cyanosis, +2 edema bilaterally, as well has swelling in upper extremities    Data Review:    CBC Recent Labs  Lab 05/19/2019 1700 06/01/2019 1951  WBC 2.6* 5.6  HGB 16.5* 10.9*  HCT 56.4* 37.5  PLT 52* 99*  MCV 101.3* 102.2*  MCH 29.6 29.7  MCHC 29.3* 29.1*  RDW 16.7* 16.3*  LYMPHSABS 0.3* 0.5*  MONOABS 0.1 0.3  EOSABS 0.0 0.0  BASOSABS 0.0 0.0    Chemistries  Recent Labs  Lab 05/20/2019 1700  NA 143  K 3.7  CL 91*  CO2 38*  GLUCOSE 206*  BUN 21*  CREATININE 0.76  CALCIUM 8.9  AST 26  ALT 64*  ALKPHOS 189*  BILITOT 0.2*   ------------------------------------------------------------------------------------------------------------------ Recent Labs     05/29/2019 1700  TRIG 208*    Lab Results  Component Value Date   HGBA1C 5.5 05/16/2019   ------------------------------------------------------------------------------------------------------------------ No results for input(s): TSH, T4TOTAL, T3FREE, THYROIDAB in the last 72 hours.  Invalid input(s): FREET3 ------------------------------------------------------------------------------------------------------------------ Recent Labs    06/06/2019 1700  FERRITIN 428*    Coagulation profile No results for input(s): INR, PROTIME in the last 168 hours.  Recent Labs    06/10/2019 1700  DDIMER 1.09*    Cardiac Enzymes No results for input(s): CKMB, TROPONINI, MYOGLOBIN in the last 168 hours.  Invalid input(s): CK ------------------------------------------------------------------------------------------------------------------    Component Value Date/Time   BNP 19.3 06/08/2019 1700    Inpatient Medications  Scheduled Meds:  [START ON 06/06/2019] ARIPiprazole  2 mg Oral QODAY   enoxaparin (LOVENOX) injection  70 mg Subcutaneous Daily   feeding supplement (PRO-STAT SUGAR FREE 64)  30 mL Oral BID   fluticasone furoate-vilanterol  1 puff Inhalation Daily   furosemide  80 mg Intravenous Q8H   insulin aspart  0-20 Units Subcutaneous TID WC   insulin aspart  0-5 Units Subcutaneous QHS   insulin detemir  8 Units Subcutaneous BID  lamoTRIgine  25 mg Oral Daily   methylPREDNISolone (SOLU-MEDROL) injection  80 mg Intravenous Q8H   metoprolol succinate  12.5 mg Oral Daily   polyethylene glycol  17 g Oral QODAY   potassium chloride  40 mEq Oral Once   pravastatin  40 mg Oral q1800   pregabalin  100 mg Oral BID   senna  2 tablet Oral QHS   sodium chloride flush  3 mL Intravenous Q12H   torsemide  40 mg Oral BID   trazodone  300 mg Oral QHS   umeclidinium bromide  1 puff Inhalation Daily   venlafaxine XR  300 mg Oral Daily   venlafaxine XR  37.5 mg Oral Daily     Continuous Infusions:  ampicillin-sulbactam (UNASYN) IV 3 g (06/05/19 1203)   azithromycin Stopped (06/05/19 0246)   remdesivir 200 mg in NS 250 mL     Followed by   Derrill Memo ON 06/06/2019] remdesivir 100 mg in NS 250 mL     vancomycin Stopped (05/26/2019 2250)   PRN Meds:.acetaminophen, albuterol, oxyCODONE  Micro Results Recent Results (from the past 240 hour(s))  SARS Coronavirus 2 (CEPHEID- Performed in [place] hospital lab), Hosp Order     Status: Abnormal   Collection Time: 06/12/2019  7:51 PM   Specimen: Nasopharyngeal Swab  Result Value Ref Range Status   SARS Coronavirus 2 POSITIVE (A) NEGATIVE Final    Comment: RESULT CALLED TO, READ BACK BY AND VERIFIED WITH: RHONEY A @2246  ON 06/03/2019 JACKSON,K (NOTE) If result is NEGATIVE SARS-CoV-2 target nucleic acids are NOT DETECTED. The SARS-CoV-2 RNA is generally detectable in upper and lower  respiratory specimens during the acute phase of infection. The lowest  concentration of SARS-CoV-2 viral copies this assay can detect is 250  copies / mL. A negative result does not preclude SARS-CoV-2 infection  and should not be used as the sole basis for treatment or other  patient management decisions.  A negative result may occur with  improper specimen collection / handling, submission of specimen other  than nasopharyngeal swab, presence of viral mutation(s) within the  areas targeted by this assay, and inadequate number of viral copies  (<250 copies / mL). A negative result must be combined with clinical  observations, patient history, and epidemiological information. If result is POSITIVE SARS-CoV-2 target nucleic acids are DETECTED.  The SARS-CoV-2 RNA is generally detectable in upper and lower  respiratory specimens during the acute phase of infection.  Positive  results are indicative of active infection with SARS-CoV-2.  Clinical  correlation with patient history and other diagnostic information is  necessary to  determine patient infection status.  Positive results do  not rule out bacterial infection or co-infection with other viruses. If result is PRESUMPTIVE POSTIVE SARS-CoV-2 nucleic acids MAY BE PRESENT.   A presumptive positive result was obtained on the submitted specimen  and confirmed on repeat testing.  While 2019 novel coronavirus  (SARS-CoV-2) nucleic acids may be present in the submitted sample  additional confirmatory testing may be necessary for epidemiological  and / or clinical management purposes  to differentiate between  SARS-CoV-2 and other Sarbecovirus currently known to infect humans.  If clinically indicated additional testing with an alternate test  methodology [phone])  is advised. The SARS-CoV-2 RNA is generally  detectable in upper and lower respiratory specimens during the acute  phase of infection. The expected result is Negative. Fact Sheet for Patients:  StrictlyIdeas.no Fact Sheet for Healthcare Providers: BankingDealers.co.za This test is not yet  approved or cleared by the [place] and has been authorized for detection and/or diagnosis of SARS-CoV-2 by FDA under an Emergency Use Authorization (EUA).  This EUA will remain in effect (meaning this test can be used) for the duration of the COVID-19 declaration under Section 564(b)(1) of the Act, 21 U.S.C. section 360bbb-3(b)(1), unless the authorization is terminated or revoked sooner. Performed at [facility], [place] [address] [place], [place] [address]   Culture, sputum-assessment     Status: None   Collection Time: 06/05/19 12:30 PM   Specimen: Sputum  Result Value Ref Range Status   Specimen Description SPUTUM  Final   Special Requests NONE  Final   Sputum evaluation   Final    THIS SPECIMEN IS ACCEPTABLE FOR SPUTUM CULTURE Performed at [facility], [place] [address], [place], [place] [address]    Report Status  06/05/2019 FINAL  Final    Radiology Reports Ct Angio Chest Pe W Or Wo Contrast  Result Date: 05/15/2019 CLINICAL DATA:  Patient admitted 05/14/2019 for shortness of breath and decreased oxygen saturation. History of COPD. EXAM: CT ANGIOGRAPHY CHEST WITH CONTRAST TECHNIQUE: Multidetector CT imaging of the chest was performed using the standard protocol during bolus administration of intravenous contrast. Multiplanar CT image reconstructions and MIPs were obtained to evaluate the vascular anatomy. CONTRAST:  100 mL OMNIPAQUE IOHEXOL 350 MG/ML SOLN COMPARISON:  Single-view of the chest 05/14/2019, 01/10/2019 and 11/26/2016. FINDINGS: Cardiovascular: The study is somewhat limited by bolus timing but no pulmonary embolus is identified. There is cardiomegaly. No pericardial effusion. No aortic aneurysm. Mediastinum/Nodes: No lymphadenopathy. Thyromegaly is most notable in the left lobe. The esophagus is unremarkable. Lungs/Pleura: Small bilateral pleural effusions greater on the left. Dependent bilateral lower lobe airspace disease with air bronchograms is seen. Scattered ground-glass attenuation and mild interlobular septal thickening are identified. Upper Abdomen: There is fatty infiltration of the liver and hepatomegaly. The patient is status post cholecystectomy. Musculoskeletal: No acute or focal bony abnormality. Review of the MIP images confirms the above findings. IMPRESSION: Negative for pulmonary embolus. Dependent bilateral lower lobe airspace disease could be due to pneumonia, atelectasis or aspiration. Cardiomegaly and findings compatible with mild interstitial pulmonary edema. Small pleural effusions noted. Hepatomegaly and fatty infiltration of the liver. Electronically Signed   By: Inge Rise M.D.   On: 05/15/2019 15:03   Dg Chest Port 1 View  Result Date: 06/07/2019 CLINICAL DATA:  Increasing lethargy. COVID positive. Hypoxic. EXAM: PORTABLE CHEST 1 VIEW COMPARISON:  CT of the chest  05/15/2019 FINDINGS: Cardiomediastinal silhouette is normal. Mediastinal contours appear intact. Increasing dense airspace consolidation in the right lower lung field. No evidence of pneumothorax or pleural effusion. Osseous structures are without acute abnormality. Soft tissues are grossly normal. IMPRESSION: Increasing dense airspace consolidation in the right lower lung field. Electronically Signed   By: Fidela Salisbury M.D.   On: 06/15/2019 17:57   Dg Chest Portable 1 View  Result Date: 05/14/2019 CLINICAL DATA:  Low oxygen saturation. EXAM: PORTABLE CHEST 1 VIEW COMPARISON:  01/10/2019 and multiple previous FINDINGS: The patient is rotated towards the right as usual. Cardiac silhouette is enlarged. There is diffuse pulmonary density which appear slightly more prominent than seen previously. This could reflect pulmonary edema superimposed on chronic lung disease. No dense consolidation or lobar collapse. IMPRESSION: Rotated. Cardiomegaly. Diffuse lung density, similar to previous studies but possibly slightly more pronounced. This could represent mild edema superimposed upon chronic lung disease. Electronically Signed   By:  Nelson Chimes M.D.   On: 05/14/2019 19:11   [place] Ekg Site Rite  Result Date: 06/05/2019 If Site Rite image not attached, placement could not be confirmed due to current cardiac rhythm.   Time Spent in minutes 35 minutes CRITICAL CARE Performed by: Phillips Climes  The patient is critically ill with multi-organ failure.  Critical care was necessary to treat or prevent imminent or life-threatening deterioration of sepsis, respiratory failure, cardiac failure, encephalopathy and was exclusive of separately billable procedures and treating other patients. Total critical care time spent by me: 35 minutes Time spent personally by me on obtaining history from patient or surrogate, evaluation of the patient, evaluation of patient's response to treatment, ordering and review of  laboratory studies, ordering and review of radiographic studies, ordering and performing treatments and interventions, and re-evaluation of the patient's condition.     Phillips Climes M.D on 06/05/2019 at 1:10 PM  Between 7am to 7pm - Pager - [phone]  After 7pm go to [email or web address] - password [facility]  [facility] -  Office  [phone]

## 2019-06-05 NOTE — Progress Notes (Signed)
RT responded to rapid response for pt needing ABG and being drowsy. ABG obtained after multiple attempts and results given to MD at the bedside. ABG not crossing over in Epic, however MD stated no changes to pt at this time. RT will continue to monitor pt

## 2019-06-05 NOTE — Progress Notes (Signed)
Pharmacy Antibiotic Note  Kathleen Roy is a 58 y.o. female admitted on 06/02/2019 with COVID-19 pneumonia and suspected aspiration pneumonia.  Pharmacy has been consulted for Unasyn, Vancomycin dosing.  Plan: Unasyn 3g IV q6h Continue Vancomycin 2g IV q24h  Continue Azithromycin per MD Goal AUC = 400 - 550.  Expected AUC 474 using SCr 0.76, Vd 0.5 Follow up renal function, culture results, and clinical course.   Height: 5\' 2"  (157.5 cm) Weight: (!) 320 lb (145.2 kg) IBW/kg (Calculated) : 50.1  Temp (24hrs), Avg:100.4 F (38 C), Min:98.3 F (36.8 C), Max:102.2 F (39 C)  Recent Labs  Lab 05/17/2019 1630 05/19/2019 1700 05/20/2019 1951  WBC  --  2.6* 5.6  CREATININE  --  0.76  --   LATICACIDVEN 1.2  --   --     Estimated Creatinine Clearance: 107.9 mL/min (by C-G formula based on SCr of 0.76 mg/dL).    Allergies  Allergen Reactions  . Aspirin Hives   Antimicrobials this admission: 6/19 Vanc >>  6/19 Ceftriaxone >> 6/20 6/19 Azithromycin >>  6/19 Unasyn >>   Dose adjustments this admission:  Microbiology results: 6/19 SARS Coronavirus 2: positive 6/19 BCx:  Legionella/Strep pneumo/Sputum orders held  Prev Cx 5/29 MRSA PCR: pos 5/29 & 5/30 Covid: neg 5/30 Resp panel: neg  Thank you for allowing pharmacy to be a part of this patient's care.  Gretta Arab PharmD, BCPS Clinical Pharmacist Clinical pharmacist phone 7am- 5pm: [phone] 06/05/2019 8:03 AM

## 2019-06-05 NOTE — Progress Notes (Addendum)
4782  Resting in bed.  Drowsy.  Awakens to stimulation.  Oriented to self and place.  Reoriented to time.  O2 sats upper 88-90% on NRB at 15L and HFNC at 3L.  Congested non productive cough noted.  O2 sats decrease to low 80's when coughing.  Temp 102.2.  MD notified for orders.    0842  Tylenol given for fever.  Still lethargic.  Am meds given with sips of water.  O2 sats  83-85%.  Attempted to educate patient on deep breathing.    1008  MD at bedside.  Patient only responding to painful stimuli.  Orders received for IV lasix and Narcan.  Given.  See mar.  More awake and verbal after the narcan.  NRB removed and placed on 15L/HFNC only per MD verbal order.  O2 sats remain 88-90%.    1048  Solumedrol x 1 given.  See mar.  1130  Foley inserted using sterile technique.  See flowsheet.  Specimen sent to lab.      1205   Patient lethargic but easier to arouse.  MD at bedside.  Verbal order for Narcan x 1.  Given. See mar.  Patient much more awake and alert after narcan.  Requesting water.  Water provided.  No difficulty swallowing noted.    1230  Sitting up in bed.  Eating lunch with assist of staff.  O2 sats 90-93%.  On  8L/HFNC.  1347  Resting in bed with eyes closed.  Awakens easily.   Transferred to room 119 due to malfunction with monitoring in room.  Transferred on 6L/HFNC.  After transport O2 sat upper 70's low 80's.  O2 increased to 10L  1745  Resting in bed.  Alert.  O2 sat 95% on 10L/HFNC.  Decreased O2 to 8L.  1815  Sitting up in bed eating dinner with assist of staff.  O2 sat 94-96% on 8L/[place]

## 2019-06-05 NOTE — Progress Notes (Signed)
Pharmacy Brief Note   O:  ALT: 64 CXR: Increasing dense airspace consolidation in the right lower lung field. SpO2: 88% on 15L NRB   A/P:  Patient meets criteria for remdesivir. Will initiate remdesivir 200 mg once followed by 100 mg daily x 4 days.   Gretta Arab PharmD, BCPS Clinical Pharmacist Clinical pharmacist phone 7am- 5pm: [phone] 06/05/2019 10:02 AM

## 2019-06-06 ENCOUNTER — Other Ambulatory Visit: Payer: Self-pay

## 2019-06-06 LAB — COMPREHENSIVE METABOLIC PANEL
ALT: 34 U/L (ref 0–44)
AST: 20 U/L (ref 15–41)
Albumin: 2.8 g/dL — ABNORMAL LOW (ref 3.5–5.0)
Alkaline Phosphatase: 122 U/L (ref 38–126)
Anion gap: 11 (ref 5–15)
BUN: 31 mg/dL — ABNORMAL HIGH (ref 6–20)
CO2: 40 mmol/L — ABNORMAL HIGH (ref 22–32)
Calcium: 8.2 mg/dL — ABNORMAL LOW (ref 8.9–10.3)
Chloride: 90 mmol/L — ABNORMAL LOW (ref 98–111)
Creatinine, Ser: 0.78 mg/dL (ref 0.44–1.00)
GFR calc Af Amer: >60 mL/min (ref >60)
GFR calc non Af Amer: >60 mL/min (ref >60)
Glucose, Bld: 231 mg/dL — ABNORMAL HIGH (ref 70–99)
Potassium: 3.6 mmol/L (ref 3.5–5.1)
Sodium: 141 mmol/L (ref 135–145)
Total Bilirubin: 0.6 mg/dL (ref 0.3–1.2)
Total Protein: 6.2 g/dL — ABNORMAL LOW (ref 6.5–8.1)

## 2019-06-06 LAB — BLOOD CULTURE ID PANEL (REFLEXED)

## 2019-06-06 LAB — D-DIMER, QUANTITATIVE: D-Dimer, Quant: 1.37 ug/mL-FEU — ABNORMAL HIGH (ref 0.00–0.50)

## 2019-06-06 LAB — LEGIONELLA PNEUMOPHILA SEROGP 1 UR AG: L. pneumophila Serogp 1 Ur Ag: NEGATIVE

## 2019-06-06 LAB — CBC WITH DIFFERENTIAL/PLATELET
Abs Immature Granulocytes: 0.03 10*3/uL (ref 0.00–0.07)
Basophils Absolute: 0 10*3/uL (ref 0.0–0.1)
Basophils Relative: 0 %
Eosinophils Absolute: 0 10*3/uL (ref 0.0–0.5)
Eosinophils Relative: 0 %
HCT: 30.7 % — ABNORMAL LOW (ref 36.0–46.0)
Hemoglobin: 9.2 g/dL — ABNORMAL LOW (ref 12.0–15.0)
Immature Granulocytes: 1 %
Lymphocytes Relative: 7 %
Lymphs Abs: 0.3 10*3/uL — ABNORMAL LOW (ref 0.7–4.0)
MCH: 29.4 pg (ref 26.0–34.0)
MCHC: 30 g/dL (ref 30.0–36.0)
MCV: 98.1 fL (ref 80.0–100.0)
Monocytes Absolute: 0.2 10*3/uL (ref 0.1–1.0)
Monocytes Relative: 4 %
Neutro Abs: 3.8 10*3/uL (ref 1.7–7.7)
Neutrophils Relative %: 88 %
Platelets: 84 10*3/uL — ABNORMAL LOW (ref 150–400)
RBC: 3.13 MIL/uL — ABNORMAL LOW (ref 3.87–5.11)
RDW: 15.9 % — ABNORMAL HIGH (ref 11.5–15.5)
WBC Morphology: INCREASED
WBC: 4.3 10*3/uL (ref 4.0–10.5)
nRBC: 0 % (ref 0.0–0.2)

## 2019-06-06 LAB — C-REACTIVE PROTEIN: CRP: 19.1 mg/dL — ABNORMAL HIGH (ref <1.0)

## 2019-06-06 LAB — GLUCOSE, CAPILLARY
Glucose-Capillary: 193 mg/dL — ABNORMAL HIGH (ref 70–99)
Glucose-Capillary: 488 mg/dL — ABNORMAL HIGH (ref 70–99)
Glucose-Capillary: 432 mg/dL — ABNORMAL HIGH (ref 70–99)
Glucose-Capillary: 435 mg/dL — ABNORMAL HIGH (ref 70–99)

## 2019-06-06 LAB — MAGNESIUM: Magnesium: 1.8 mg/dL (ref 1.7–2.4)

## 2019-06-06 LAB — FERRITIN: Ferritin: 1122 ng/mL — ABNORMAL HIGH (ref 11–307)

## 2019-06-06 MED ORDER — INSULIN ASPART 100 UNIT/ML ~~LOC~~ SOLN
15.0000 [IU] | Freq: Once | SUBCUTANEOUS | Status: AC
  Administered 2019-06-06: 15 [IU] via SUBCUTANEOUS

## 2019-06-06 MED ORDER — INSULIN DETEMIR 100 UNIT/ML ~~LOC~~ SOLN
18.0000 [IU] | Freq: Two times a day (BID) | SUBCUTANEOUS | Status: DC
  Administered 2019-06-06 – 2019-06-07 (×4): 18 [IU] via SUBCUTANEOUS
  Filled 2019-06-06 (×6): qty 0.18

## 2019-06-06 MED ORDER — POTASSIUM CHLORIDE CRYS ER 20 MEQ PO TBCR
40.0000 meq | EXTENDED_RELEASE_TABLET | Freq: Once | ORAL | Status: AC
  Administered 2019-06-06: 40 meq via ORAL
  Filled 2019-06-06: qty 2

## 2019-06-06 MED ORDER — INSULIN ASPART 100 UNIT/ML ~~LOC~~ SOLN
5.0000 [IU] | Freq: Three times a day (TID) | SUBCUTANEOUS | Status: DC
  Administered 2019-06-06 – 2019-06-07 (×6): 5 [IU] via SUBCUTANEOUS

## 2019-06-06 MED ORDER — METHYLPREDNISOLONE SODIUM SUCC 40 MG IJ SOLR
40.0000 mg | Freq: Three times a day (TID) | INTRAMUSCULAR | Status: DC
  Administered 2019-06-06 – 2019-06-07 (×5): 40 mg via INTRAVENOUS
  Filled 2019-06-06 (×6): qty 1

## 2019-06-06 MED ORDER — INSULIN ASPART 100 UNIT/ML ~~LOC~~ SOLN: 15.0000 [IU] | Freq: Once | SUBCUTANEOUS | Status: DC

## 2019-06-06 NOTE — Progress Notes (Signed)
This note also relates to the following rows which could not be included: Pulse Rate - Cannot attach notes to unvalidated device data ECG Heart Rate - Cannot attach notes to unvalidated device data SpO2 - Cannot attach notes to unvalidated device data    06/06/19 2015  Provider Notification  Provider Name/Title Ghimire MD  Date Provider Notified 06/06/19  Notification Type Page  Notification Reason Change in status  Response See new orders  Date of Provider Response 06/06/19   Patient's blood sugar is 435.  Dr. Sloan Leiter made aware and new orders received and implemented.  Will continue to monitor patient.  Earleen Reaper RN

## 2019-06-06 NOTE — Progress Notes (Signed)
PROGRESS NOTE                                                                                                                                                                                                             Patient Demographics:    Kathleen Roy, is a 58 y.o. female, DOB - 08-21-1961, HXT:[id number]  Admit date - 05/25/2019   Admitting Physician Vianne Bulls, MD  Outpatient Primary MD for the patient is [name], MD  LOS - 2   Chief Complaint  Patient presents with  . Shortness of Breath    COVID positive        Brief Narrative   58 y.o. female with medical history significant for COPD, chronic respiratory failure, type 2 diabetes mellitus, hypertension, obstructive sleep apnea, and chronic diastolic CHF,  presenting to the emergency department from a nursing facility for evaluation of worsening lethargy and increased supplemental oxygen requirement., Was COVID-19 positive, as well work-up significant for aspiration pneumonia, worsening respiratory failure .    Subjective:    Kathleen Roy today mentation has improved, as well her breathing is better .   Assessment  & Plan :    Principal Problem:   Pneumonia Active Problems:   Major depressive disorder, recurrent episode (HCC)   COPD (chronic obstructive pulmonary disease) (HCC)   OSA (obstructive sleep apnea)   Obesity hypoventilation syndrome (HCC)   Essential hypertension   Type 2 diabetes mellitus (HCC)   Chronic pain of both knees   Acute on chronic respiratory failure with hypoxia and hypercapnia (HCC)   Acute respiratory disease due to COVID-19 virus   Sepsis -Sepsis present on admission, febrile, tachypneic, tachycardic, with increased work of breathing, and encephalopathy. -She is most likely related to pneumonia, questionable aspiration, and COVID-19 infection. -Present with dense right lung base opacity, highly suspicious for aspiration pneumonia, as well she  was encephalopathic on presentation, empirically on azithromycin, Rocephin and vancomycin, I have switched Rocephin to Unasyn to cover for aspiration empirically, will discontinue her vancomycin today, will follow on sputum cultures .Marland Kitchen  Acute on chronic hypoxic respiratory failure/hypercapnic respiratory failure -Baseline on 4 L nasal cannula, this morning she is on 10 L high flow+ NRB -Respiratory failure appears to be multifactorial, in the setting of aspiration pneumonia, significant volume overload , COPD exacerbation and COVID-19 infection. -Was up  to 15 L high flow nasal cannula yesterday, oxygen requirement appears to have improved today, she is on 10 L high flow nasal cannula, I have discussed at length with the patient, encouraged her to use incentive spirometry . - she is DNR, no intubation, ABG showing hypercapnia, but appears to be compensated.  COVID 19 infection -Elevated inflammatory markers, with worsening respiratory failure, respiratory failure secondary to bacterial pneumonia and COVID-19 infection, given how frail she and at risk for rapid decompensation, -Need to trend inflammatory markers closely, they are trending up, will continue monitor closely, she was started on Remdesivir 06/05/2019 ,  - will continue with IV Solu-Medrol,  - has elevated procalcitonin, and possible aspiration pneumonia , so will not give Actemra.  COPD with acute exacerbation  - She reports increased cough, increased sputum production, and wheezing. -Continue with IV Solu-Medrol - Continue ICS/LABA, Spiriva, and albuterol,  - continue supplemental O2, antibiotics as above  -Discussed with staff, will keep O2 saturation between 88 to 92%.  Acute on Chronic diastolic CHF  -With significant volume overload currently, even though Breeback BNP within normal limit, she is significantly hypervolemic on physical exam, she is on torsemide 40 twice daily at home, new with IV diuresis, continue with Lasix 80  mg every 8 hours, she is -1.7 L over last 24 hours, continue with current dose, monitor electrolytes closely, continue with strict ins and out.  Acute metabolic encephalopathy -Initially altered most likely in the setting of sepsis, respiratory failure and tachypnea . -Resolved, back to baseline .    Hypertension  - BP at goal, discontinue Toprol as blood pressure on the lower side after diuresis  Type II DM  - A1c was only 5.5% last month and she has been diet-controlled recently  - BG remains uncontrolled, most likely in the setting of steroids which I have decreased, will increase her Levemir to 18 twice daily and will add 5 units NovoLog before meals   Leukopenia; thrombocytopenia  - Due to COVID-19 infection,  Elevated d-dimer  -This is secondary to COVID-19, will trend, she is on DVT prophylaxis    COVID-19 Labs  Recent Labs    05/27/2019 1700 06/05/19 1410 06/06/19 0508  DDIMER 1.09* 1.21* 1.37*  FERRITIN 428* 629* 1,122*  LDH 176  --   --   CRP 17.2* 22.4* 19.1*    Lab Results  Component Value Date   SARSCOV2NAA POSITIVE (A) 06/01/2019   [place] NEGATIVE 05/22/2019   [place] NEGATIVE 05/15/2019   [place] NEGATIVE 05/14/2019     Code Status : DNR   Family Communication  : To update her friend [name] left a voicemail  Disposition Plan  : remains in PCU  Consults  :  none  Procedures  : None   DVT Prophylaxis  :  [place] lovenox  Lab Results  Component Value Date   PLT 84 (L) 06/06/2019    Antibiotics  :    Anti-infectives (From admission, onward)   Start     Dose/Rate Route Frequency Ordered Stop   06/06/19 1200  remdesivir 100 mg in sodium chloride 0.9 % 250 mL IVPB     100 mg 500 mL/hr over 30 Minutes Intravenous Every 24 hours 06/05/19 1003 06/10/19 1159   06/05/19 1200  Ampicillin-Sulbactam (UNASYN) 3 g in sodium chloride 0.9 % 100 mL IVPB     3 g 200 mL/hr over 30 Minutes Intravenous Every 6 hours 06/05/19 0812     06/05/19 1200   remdesivir 200 mg in  sodium chloride 0.9 % 250 mL IVPB     200 mg 500 mL/hr over 30 Minutes Intravenous Once 06/05/19 1003 06/05/19 1417   05/17/2019 2200  cefTRIAXone (ROCEPHIN) 2 g in sodium chloride 0.9 % 100 mL IVPB  Status:  Discontinued     2 g 200 mL/hr over 30 Minutes Intravenous Every 24 hours 06/14/2019 2118 06/05/19 0741   05/21/2019 2200  azithromycin (ZITHROMAX) 500 mg in sodium chloride 0.9 % 250 mL IVPB     500 mg 250 mL/hr over 60 Minutes Intravenous Every 24 hours 05/27/2019 2118 06/09/19 2159   06/03/2019 2000  vancomycin (VANCOCIN) 2,000 mg in sodium chloride 0.9 % 500 mL IVPB     2,000 mg 250 mL/hr over 120 Minutes Intravenous Every 24 hours 06/02/2019 1955          Objective:   Vitals:   06/06/19 0400 06/06/19 0415 06/06/19 0752 06/06/19 0800  BP: (!) 111/55 117/63  (!) 106/59  Pulse: 95 97  (!) 108  Resp: (!) 24 (!) 24  (!) 24  Temp:  (!) 100.4 F (38 C) 99.2 F (37.3 C)   TempSrc:  Oral Oral   SpO2: 94% 96%  91%  Weight:      Height:        Wt Readings from Last 3 Encounters:  05/19/2019 (!) 145.2 kg  05/23/19 (!) 144.2 kg  01/10/19 (!) 163.3 kg     Intake/Output Summary (Last 24 hours) at 06/06/2019 1127 Last data filed at 06/06/2019 0600 Gross per 24 hour  Intake 3030 ml  Output 4150 ml  Net -1120 ml     Physical Exam  Awake Alert, Oriented X 3, No new F.N deficits, Normal affect Symmetrical Chest wall movement, air entry at the bases, with scattered rails and rhonchi RRR,No Gallops,Rubs or new Murmurs, No Parasternal Heave +ve B.Sounds, Abd Soft, No tenderness, No rebound - guarding or rigidity. No Cyanosis, Clubbing ,+2 edema, No new Rash or bruise      Data Review:    CBC Recent Labs  Lab 05/24/2019 1700 05/31/2019 1951 06/05/19 1410 06/06/19 0508  WBC 2.6* 5.6 4.6 4.3  HGB 16.5* 10.9* 8.9* 9.2*  HCT 56.4* 37.5 30.9* 30.7*  PLT 52* 99* 85* 84*  MCV 101.3* 102.2* 100.0 98.1  MCH 29.6 29.7 28.8 29.4  MCHC 29.3* 29.1* 28.8* 30.0  RDW  16.7* 16.3* 16.1* 15.9*  LYMPHSABS 0.3* 0.5* 0.2* 0.3*  MONOABS 0.1 0.3 0.1 0.2  EOSABS 0.0 0.0 0.0 0.0  BASOSABS 0.0 0.0 0.0 0.0    Chemistries  Recent Labs  Lab 05/31/2019 1700 06/05/19 1410 06/05/19 1835 06/06/19 0508  NA 143 138 138 141  K 3.7 3.7 3.4* 3.6  CL 91* 90* 88* 90*  CO2 38* 37* 36* 40*  GLUCOSE 206* 447* 468* 231*  BUN 21* 24* 27* 31*  CREATININE 0.76 0.82 0.83 0.78  CALCIUM 8.9 8.1* 8.1* 8.2*  MG  --   --   --  1.8  AST 26 21  --  20  ALT 64* 43  --  34  ALKPHOS 189* 135*  --  122  BILITOT 0.2* 0.3  --  0.6   ------------------------------------------------------------------------------------------------------------------ Recent Labs    05/25/2019 1700  TRIG 208*    Lab Results  Component Value Date   HGBA1C 5.5 05/16/2019   ------------------------------------------------------------------------------------------------------------------ No results for input(s): TSH, T4TOTAL, T3FREE, THYROIDAB in the last 72 hours.  Invalid input(s): FREET3 ------------------------------------------------------------------------------------------------------------------ Recent Labs    06/05/19 1410 06/06/19 8768  FERRITIN 629* 1,122*    Coagulation profile No results for input(s): INR, PROTIME in the last 168 hours.  Recent Labs    06/05/19 1410 06/06/19 0508  DDIMER 1.21* 1.37*    Cardiac Enzymes No results for input(s): CKMB, TROPONINI, MYOGLOBIN in the last 168 hours.  Invalid input(s): CK ------------------------------------------------------------------------------------------------------------------    Component Value Date/Time   BNP 19.3 05/21/2019 1700    Inpatient Medications  Scheduled Meds: . ARIPiprazole  2 mg Oral QODAY  . enoxaparin (LOVENOX) injection  70 mg Subcutaneous Daily  . feeding supplement (PRO-STAT SUGAR FREE 64)  30 mL Oral BID  . fluticasone furoate-vilanterol  1 puff Inhalation Daily  . furosemide  80 mg Intravenous  Q8H  . insulin aspart  0-20 Units Subcutaneous TID WC  . insulin aspart  0-5 Units Subcutaneous QHS  . insulin aspart  15 Units Subcutaneous Once  . insulin aspart  5 Units Subcutaneous TID WC  . insulin detemir  18 Units Subcutaneous BID  . lamoTRIgine  25 mg Oral Daily  . methylPREDNISolone (SOLU-MEDROL) injection  40 mg Intravenous Q8H  . metoprolol succinate  12.5 mg Oral Daily  . polyethylene glycol  17 g Oral QODAY  . pravastatin  40 mg Oral q1800  . pregabalin  100 mg Oral BID  . senna  2 tablet Oral QHS  . sodium chloride flush  10-40 mL Intracatheter Q12H  . sodium chloride flush  3 mL Intravenous Q12H  . torsemide  40 mg Oral BID  . trazodone  300 mg Oral QHS  . umeclidinium bromide  1 puff Inhalation Daily  . venlafaxine XR  300 mg Oral Daily  . venlafaxine XR  37.5 mg Oral Daily   Continuous Infusions: . ampicillin-sulbactam (UNASYN) IV 3 g (06/06/19 0620)  . azithromycin 500 mg (06/05/19 2256)  . remdesivir 100 mg in NS 250 mL    . vancomycin 2,000 mg (06/05/19 1949)   PRN Meds:.acetaminophen, albuterol, sodium chloride flush  Micro Results Recent Results (from the past 240 hour(s))  Blood Culture (routine x 2)     Status: None (Preliminary result)   Collection Time: 06/03/2019  5:00 PM   Specimen: BLOOD  Result Value Ref Range Status   Specimen Description   Final    BLOOD LEFT ARM Performed at [facility] [address], [place], [place] [address]    Special Requests   Final    BOTTLES DRAWN AEROBIC AND ANAEROBIC Blood Culture adequate volume Performed at [facility] [address], [place], [place] [address]    Culture  Setup Time   Final    GRAM POSITIVE COCCI AEROBIC BOTTLE ONLY CRITICAL RESULT CALLED TO, READ BACK BY AND VERIFIED WITH: N.BATCHEOBER,PHARMD 0120 06/06/2019 M.CAMPBELL Performed at [facility], [place] [address], [place],  [address]    Culture GRAM POSITIVE COCCI  Final   Report Status  PENDING  Incomplete  Blood Culture ID Panel (Reflexed)     Status: Abnormal   Collection Time: 06/15/2019  5:00 PM  Result Value Ref Range Status   Enterococcus species NOT DETECTED NOT DETECTED Final   Listeria monocytogenes NOT DETECTED NOT DETECTED Final   Staphylococcus species DETECTED (A) NOT DETECTED Final    Comment: Methicillin (oxacillin) resistant coagulase negative staphylococcus. Possible blood culture contaminant (unless isolated from more than one blood culture draw or clinical case suggests pathogenicity). No antibiotic treatment is indicated for blood  culture contaminants. CRITICAL RESULT CALLED TO, READ BACK BY  AND VERIFIED WITH: N.BATCHEOBER,PHARMD 0120 06/06/2019 M.CAMPBELL    Staphylococcus aureus (BCID) NOT DETECTED NOT DETECTED Final   Methicillin resistance DETECTED (A) NOT DETECTED Final    Comment: CRITICAL RESULT CALLED TO, READ BACK BY AND VERIFIED WITH: N.BATCHEOBER,PHARMD 0120 06/06/2019 M.CAMPBELL    Streptococcus species NOT DETECTED NOT DETECTED Final   Streptococcus agalactiae NOT DETECTED NOT DETECTED Final   Streptococcus pneumoniae NOT DETECTED NOT DETECTED Final   Streptococcus pyogenes NOT DETECTED NOT DETECTED Final   Acinetobacter baumannii NOT DETECTED NOT DETECTED Final   Enterobacteriaceae species NOT DETECTED NOT DETECTED Final   Enterobacter cloacae complex NOT DETECTED NOT DETECTED Final   Escherichia coli NOT DETECTED NOT DETECTED Final   Klebsiella oxytoca NOT DETECTED NOT DETECTED Final   Klebsiella pneumoniae NOT DETECTED NOT DETECTED Final   Proteus species NOT DETECTED NOT DETECTED Final   Serratia marcescens NOT DETECTED NOT DETECTED Final   Haemophilus influenzae NOT DETECTED NOT DETECTED Final   Neisseria meningitidis NOT DETECTED NOT DETECTED Final   Pseudomonas aeruginosa NOT DETECTED NOT DETECTED Final   Candida albicans NOT DETECTED NOT DETECTED Final   Candida glabrata NOT DETECTED NOT DETECTED Final   Candida krusei NOT  DETECTED NOT DETECTED Final   Candida parapsilosis NOT DETECTED NOT DETECTED Final   Candida tropicalis NOT DETECTED NOT DETECTED Final    Comment: Performed at [facility], [place] [address] [place], [place] [address]  SARS Coronavirus 2 (CEPHEID- Performed in [facility] hospital lab), Hosp Order     Status: Abnormal   Collection Time: 06/06/2019  7:51 PM   Specimen: Nasopharyngeal Swab  Result Value Ref Range Status   SARS Coronavirus 2 POSITIVE (A) NEGATIVE Final    Comment: RESULT CALLED TO, READ BACK BY AND VERIFIED WITH: RHONEY A @2246  ON 06/02/2019 JACKSON,K (NOTE) If result is NEGATIVE SARS-CoV-2 target nucleic acids are NOT DETECTED. The SARS-CoV-2 RNA is generally detectable in upper and lower  respiratory specimens during the acute phase of infection. The lowest  concentration of SARS-CoV-2 viral copies this assay can detect is 250  copies / mL. A negative result does not preclude SARS-CoV-2 infection  and should not be used as the sole basis for treatment or other  patient management decisions.  A negative result may occur with  improper specimen collection / handling, submission of specimen other  than nasopharyngeal swab, presence of viral mutation(s) within the  areas targeted by this assay, and inadequate number of viral copies  (<250 copies / mL). A negative result must be combined with clinical  observations, patient history, and epidemiological information. If result is POSITIVE SARS-CoV-2 target nucleic acids are DETECTED.  The SARS-CoV-2 RNA is generally detectable in upper and lower  respiratory specimens during the acute phase of infection.  Positive  results are indicative of active infection with SARS-CoV-2.  Clinical  correlation with patient history and other diagnostic information is  necessary to determine patient infection status.  Positive results do  not rule out bacterial infection or co-infection with other viruses. If result is PRESUMPTIVE  POSTIVE SARS-CoV-2 nucleic acids MAY BE PRESENT.   A presumptive positive result was obtained on the submitted specimen  and confirmed on repeat testing.  While 2019 novel coronavirus  (SARS-CoV-2) nucleic acids may be present in the submitted sample  additional confirmatory testing may be necessary for epidemiological  and / or clinical management purposes  to differentiate between  SARS-CoV-2 and other Sarbecovirus currently known to infect humans.  If clinically indicated  additional testing with an alternate test  methodology [phone])  is advised. The SARS-CoV-2 RNA is generally  detectable in upper and lower respiratory specimens during the acute  phase of infection. The expected result is Negative. Fact Sheet for Patients:  StrictlyIdeas.no Fact Sheet for Healthcare Providers: BankingDealers.co.za This test is not yet approved or cleared by the [place] FDA and has been authorized for detection and/or diagnosis of SARS-CoV-2 by FDA under an Emergency Use Authorization (EUA).  This EUA will remain in effect (meaning this test can be used) for the duration of the COVID-19 declaration under Section 564(b)(1) of the Act, 21 U.S.C. section 360bbb-3(b)(1), unless the authorization is terminated or revoked sooner. Performed at [facility], [place] [address], [place], [place] [address]   Culture, sputum-assessment     Status: None   Collection Time: 06/05/19 12:30 PM   Specimen: Sputum  Result Value Ref Range Status   Specimen Description SPUTUM  Final   Special Requests NONE  Final   Sputum evaluation   Final    THIS SPECIMEN IS ACCEPTABLE FOR SPUTUM CULTURE Performed at [facility], [place] [address], [place], [place] [address]    Report Status 06/05/2019 FINAL  Final  Culture, respiratory     Status: None (Preliminary result)   Collection Time: 06/05/19 12:30 PM   Specimen: SPU  Result Value  Ref Range Status   Specimen Description   Final    SPUTUM Performed at [facility] [address], [place], [place] [address]    Special Requests   Final    NONE Reflexed from S2110 Performed at [facility], [place] [address], [place], [place] [address]    Gram Stain   Final    FEW WBC PRESENT,BOTH PMN AND MONONUCLEAR FEW YEAST RARE GRAM POSITIVE RODS Performed at [facility], [place] [address] [place],  [address]    Culture PENDING  Incomplete   Report Status PENDING  Incomplete    Radiology Reports Ct Angio Chest Pe W Or Wo Contrast  Result Date: 05/15/2019 CLINICAL DATA:  Patient admitted 05/14/2019 for shortness of breath and decreased oxygen saturation. History of COPD. EXAM: CT ANGIOGRAPHY CHEST WITH CONTRAST TECHNIQUE: Multidetector CT imaging of the chest was performed using the standard protocol during bolus administration of intravenous contrast. Multiplanar CT image reconstructions and MIPs were obtained to evaluate the vascular anatomy. CONTRAST:  100 mL OMNIPAQUE IOHEXOL 350 MG/ML SOLN COMPARISON:  Single-view of the chest 05/14/2019, 01/10/2019 and 11/26/2016. FINDINGS: Cardiovascular: The study is somewhat limited by bolus timing but no pulmonary embolus is identified. There is cardiomegaly. No pericardial effusion. No aortic aneurysm. Mediastinum/Nodes: No lymphadenopathy. Thyromegaly is most notable in the left lobe. The esophagus is unremarkable. Lungs/Pleura: Small bilateral pleural effusions greater on the left. Dependent bilateral lower lobe airspace disease with air bronchograms is seen. Scattered ground-glass attenuation and mild interlobular septal thickening are identified. Upper Abdomen: There is fatty infiltration of the liver and hepatomegaly. The patient is status post cholecystectomy. Musculoskeletal: No acute or focal bony abnormality. Review of the MIP images confirms the above findings. IMPRESSION: Negative for  pulmonary embolus. Dependent bilateral lower lobe airspace disease could be due to pneumonia, atelectasis or aspiration. Cardiomegaly and findings compatible with mild interstitial pulmonary edema. Small pleural effusions noted. Hepatomegaly and fatty infiltration of the liver. Electronically Signed   By: Inge Rise M.D.   On: 05/15/2019 15:03   Dg Chest Port 1 View  Result Date:  05/18/2019 CLINICAL DATA:  Increasing lethargy. COVID positive. Hypoxic. EXAM: PORTABLE CHEST 1 VIEW COMPARISON:  CT of the chest 05/15/2019 FINDINGS: Cardiomediastinal silhouette is normal. Mediastinal contours appear intact. Increasing dense airspace consolidation in the right lower lung field. No evidence of pneumothorax or pleural effusion. Osseous structures are without acute abnormality. Soft tissues are grossly normal. IMPRESSION: Increasing dense airspace consolidation in the right lower lung field. Electronically Signed   By: Fidela Salisbury M.D.   On: 05/30/2019 17:57   Dg Chest Portable 1 View  Result Date: 05/14/2019 CLINICAL DATA:  Low oxygen saturation. EXAM: PORTABLE CHEST 1 VIEW COMPARISON:  01/10/2019 and multiple previous FINDINGS: The patient is rotated towards the right as usual. Cardiac silhouette is enlarged. There is diffuse pulmonary density which appear slightly more prominent than seen previously. This could reflect pulmonary edema superimposed on chronic lung disease. No dense consolidation or lobar collapse. IMPRESSION: Rotated. Cardiomegaly. Diffuse lung density, similar to previous studies but possibly slightly more pronounced. This could represent mild edema superimposed upon chronic lung disease. Electronically Signed   By: Nelson Chimes M.D.   On: 05/14/2019 19:11   [place] Ekg Site Rite  Result Date: 06/05/2019 If Site Rite image not attached, placement could not be confirmed due to current cardiac rhythm.   Time Spent in minutes 35 minutes   Phillips Climes M.D on 06/06/2019 at 11:27  AM  Between 7am to 7pm - Pager - [phone]  After 7pm go to [email or web address] - password [facility]  [facility] -  Office  [phone]

## 2019-06-06 NOTE — Progress Notes (Signed)
PHARMACY - PHYSICIAN COMMUNICATION CRITICAL VALUE ALERT - BLOOD CULTURE IDENTIFICATION (BCID)  Kathleen Roy is an 58 y.o. female who presented to [facility] on 06/02/2019 with a chief complaint of lethargy. Assessment:  58 yo F presents from [facility] with increased lethargy. Was already diagnosed with COVID-19. Now 1/4 blood cx shows staph species. Probable contaminant. Also on day #2 of abx for aspiration PNA.   Name of physician (or Provider) Contacted: Raelyn Mora  Current antibiotics: Unasyn and Azithromycin  Changes to prescribed antibiotics recommended:  No changes needed Consider stopping azithromycin soon and continuing with Unasyn for 5-7 day course   Results for orders placed or performed during the hospital encounter of 06/13/2019  Blood Culture ID Panel (Reflexed) (Collected: 05/31/2019  5:00 PM)  Result Value Ref Range   Enterococcus species NOT DETECTED NOT DETECTED   Listeria monocytogenes NOT DETECTED NOT DETECTED   Staphylococcus species DETECTED (A) NOT DETECTED   Staphylococcus aureus (BCID) NOT DETECTED NOT DETECTED   Methicillin resistance DETECTED (A) NOT DETECTED   Streptococcus species NOT DETECTED NOT DETECTED   Streptococcus agalactiae NOT DETECTED NOT DETECTED   Streptococcus pneumoniae NOT DETECTED NOT DETECTED   Streptococcus pyogenes NOT DETECTED NOT DETECTED   Acinetobacter baumannii NOT DETECTED NOT DETECTED   Enterobacteriaceae species NOT DETECTED NOT DETECTED   Enterobacter cloacae complex NOT DETECTED NOT DETECTED   Escherichia coli NOT DETECTED NOT DETECTED   Klebsiella oxytoca NOT DETECTED NOT DETECTED   Klebsiella pneumoniae NOT DETECTED NOT DETECTED   Proteus species NOT DETECTED NOT DETECTED   Serratia marcescens NOT DETECTED NOT DETECTED   Haemophilus influenzae NOT DETECTED NOT DETECTED   Neisseria meningitidis NOT DETECTED NOT DETECTED   Pseudomonas aeruginosa NOT DETECTED NOT DETECTED   Candida albicans NOT DETECTED NOT DETECTED   Candida glabrata NOT DETECTED NOT DETECTED   Candida krusei NOT DETECTED NOT DETECTED   Candida parapsilosis NOT DETECTED NOT DETECTED   Candida tropicalis NOT DETECTED NOT DETECTED    Elenor Quinones, PharmD, BCPS, BCIDP Clinical Pharmacist 06/06/2019 1:23 AM

## 2019-06-06 NOTE — Progress Notes (Signed)
Patient on phone with Kathleen Roy and updated her on status of her care.  Earleen Reaper RN

## 2019-06-06 NOTE — Progress Notes (Signed)
Patient's friend, [name], on phone with patient in the patient's room.  With patient's permission, I updated both of them on the patient's condition.  Questions addressed and answered.  Earleen Reaper RN

## 2019-06-06 NOTE — Plan of Care (Signed)
  Problem: Education: Goal: Knowledge of General Education information will improve Description: Including pain rating scale, medication(s)/side effects and non-pharmacologic comfort measures Outcome: Progressing   Problem: Health Behavior/Discharge Planning: Goal: Ability to manage health-related needs will improve Outcome: Progressing   Problem: Clinical Measurements: Goal: Ability to maintain clinical measurements within normal limits will improve Outcome: Progressing Goal: Diagnostic test results will improve Outcome: Progressing Goal: Cardiovascular complication will be avoided Outcome: Progressing   Problem: Nutrition: Goal: Adequate nutrition will be maintained Outcome: Progressing   Problem: Coping: Goal: Level of anxiety will decrease Outcome: Progressing   Problem: Elimination: Goal: Will not experience complications related to urinary retention Outcome: Progressing Note: foley   Problem: Pain Managment: Goal: General experience of comfort will improve Outcome: Progressing   Problem: Safety: Goal: Ability to remain free from injury will improve Outcome: Progressing   Problem: Education: Goal: Knowledge of risk factors and measures for prevention of condition will improve Outcome: Progressing   Problem: Coping: Goal: Psychosocial and spiritual needs will be supported Outcome: Progressing   Problem: Respiratory: Goal: Will maintain a patent airway Outcome: Progressing Goal: Complications related to the disease process, condition or treatment will be avoided or minimized Outcome: Progressing   Problem: Clinical Measurements: Goal: Respiratory complications will improve Outcome: Not Progressing Note: Unable to wean oxygen at this time.   Problem: Activity: Goal: Risk for activity intolerance will decrease Outcome: Not Progressing Note: Continues to be dependent for feeding, encouraged independence   Problem: Elimination: Goal: Will not experience  complications related to bowel motility Outcome: Not Progressing Note: No BM this admission   Problem: Skin Integrity: Goal: Risk for impaired skin integrity will decrease Outcome: Not Progressing Note: Refuses bed mobility

## 2019-06-06 NOTE — Progress Notes (Signed)
   06/05/19 2208  Provider Notification  Provider Name/Title Ghimire MD   Date Provider Notified 06/05/19  Time Provider Notified 2203  Notification Type Page  Notification Reason Change in status  Response See new orders  Date of Provider Response 06/05/19   CBG at HS was 408.  Dr. Sloan Leiter made aware.  Additional 15 units ordered.  5 Units already scheduled.  Total of 20 Units given SQ.  Will continue to monitor patient.  Earleen Reaper RN

## 2019-06-06 NOTE — Progress Notes (Signed)
0758  Resting in bed with eyes closed.  Awakens easily.  O2 at 10l/HFNC.

## 2019-06-06 NOTE — Evaluation (Signed)
Clinical/Bedside Swallow Evaluation Patient Details  Name: Kathleen Roy MRN: [id number] Date of Birth: 12/12/1961  Today's Date: 06/06/2019 Time: SLP Start Time (ACUTE ONLY): 1440 SLP Stop Time (ACUTE ONLY): 1455 SLP Time Calculation (min) (ACUTE ONLY): 15 min  Past Medical History:  Past Medical History:  Diagnosis Date  . Arthritis    "I feel like it's everywhere" (09/24/2017  . Cellulitis and abscess of lower extremity 08/16/2016   right leg  . CHF (congestive heart failure) ([place])   . Chronic lower back pain   . COPD (chronic obstructive pulmonary disease) ([place])   . Depression   . Fibromyalgia   . Headache    "once q couple months now" (09/24/2017)  . Hyperlipidemia   . Hypertension   . HYPERTENSION, BENIGN SYSTEMIC 02/12/2007  . Memory disturbance   . Migraines 1970's - <2000   "they just went away"  . Neuropathy    "bad in my right hand and in both feet" (09/24/2017)  . Normal echocardiogram 05/30/05   suboptimal study  . Obesity, morbid (more than 100 lbs over ideal weight or BMI > 40) (HCC)    obese since childhood  . On home oxygen therapy    "4L; 24/7" (09/24/2017)  . Osteoarthritis   . Peripheral edema   . Pressure ulcer of foot    left  . Sepsis ([place])   . Shortness of breath dyspnea   . Sleep apnea 10/2014   "couldn't tolerate the mask" (09/24/2017)  . Type II diabetes mellitus ([place])    Past Surgical History:  Past Surgical History:  Procedure Laterality Date  . ABDOMINAL HERNIA REPAIR  2002  . APPENDECTOMY  1995  . CHOLECYSTECTOMY OPEN  1995  . HERNIA REPAIR    . TOTAL ABDOMINAL HYSTERECTOMY  1994   HPI:  58 y.o. female with medical history significant for COPD, chronic respiratory failure, type 2 diabetes mellitus, hypertension, obstructive sleep apnea, and chronic diastolic CHF,  presenting to the emergency department from a nursing facility for evaluation of worsening lethargy and increased supplemental oxygen requirement., Was COVID-19 positive. MD  concerned about aspiration pna.    Assessment / Plan / Recommendation Clinical Impression  Pt demonstrates no signs of aspiration with consecutive straw sips, no suggestion of oropharyngeal dysphagia. She does demonstrate prolonged but complete, mastication of regular solids, requires increased liquid or moistened food to achieve oral transit. Feels like she does not get enough fluids at SNF. We discussed strategies for moistening food and discussing possible xerostomia with MD. Beatris Ship on the phone during assessment denies any knowledge of difficulty swallowing and RN also reports similar findings as SLP. Questioned pt regarding symptoms of possible esophageal dysphagia or postprandial aspiration, but pt denied any symptoms and none were observed. Pt may have had an aspiration event at some point in the past if menation was poor, but at this time she appears capable of tolerating diet with minimal risk. Will sign off.  SLP Visit Diagnosis: Dysphagia, unspecified (R13.10)    Aspiration Risk  Mild aspiration risk    Diet Recommendation Regular;Thin liquid   Liquid Administration via: Cup;Straw Medication Administration: Whole meds with liquid Supervision: Patient able to self feed Postural Changes: Seated upright at 90 degrees    Other  Recommendations     Follow up Recommendations None      Frequency and Duration            Prognosis        Swallow Study   General HPI:  58 y.o. female with medical history significant for COPD, chronic respiratory failure, type 2 diabetes mellitus, hypertension, obstructive sleep apnea, and chronic diastolic CHF,  presenting to the emergency department from a nursing facility for evaluation of worsening lethargy and increased supplemental oxygen requirement., Was COVID-19 positive. MD concerned about aspiration pna.  Type of Study: Bedside Swallow Evaluation Diet Prior to this Study: Regular;Thin liquids Temperature Spikes Noted:  No Respiratory Status: Nasal cannula History of Recent Intubation: No Behavior/Cognition: Alert;Cooperative;Pleasant mood Oral Cavity Assessment: Within Functional Limits Oral Care Completed by SLP: No Oral Cavity - Dentition: Poor condition;Missing dentition Vision: Functional for self-feeding Self-Feeding Abilities: Needs set up Patient Positioning: Partially reclined Baseline Vocal Quality: Normal Volitional Cough: Strong Volitional Swallow: Able to elicit    Oral/Motor/Sensory Function Overall Oral Motor/Sensory Function: Within functional limits   Ice Chips     Thin Liquid Thin Liquid: Within functional limits Presentation: Straw;Self Fed    Nectar Thick Nectar Thick Liquid: Not tested   Honey Thick Honey Thick Liquid: Not tested   Puree Puree: Within functional limits Presentation: Self Fed;Spoon   Solid     Solid: Impaired Presentation: Self Fed Oral Phase Impairments: Impaired mastication Oral Phase Functional Implications: Prolonged oral transit     Herbie [place], MA [place] Pager [phone] Office [phone]   Lynann Beaver 06/06/2019,3:21 PM

## 2019-06-07 LAB — POCT I-STAT 7, (LYTES, BLD GAS, ICA,H+H)
Acid-Base Excess: 12 mmol/L — ABNORMAL HIGH (ref 0.0–2.0)
Acid-Base Excess: 14 mmol/L — ABNORMAL HIGH (ref 0.0–2.0)
Bicarbonate: 39.8 mmol/L — ABNORMAL HIGH (ref 20.0–28.0)
Bicarbonate: 40.3 mmol/L — ABNORMAL HIGH (ref 20.0–28.0)
Calcium, Ion: 1.24 mmol/L (ref 1.15–1.40)
Calcium, Ion: 1.2 mmol/L (ref 1.15–1.40)
HCT: 33 % — ABNORMAL LOW (ref 36.0–46.0)
HCT: 31 % — ABNORMAL LOW (ref 36.0–46.0)
Hemoglobin: 11.2 g/dL — ABNORMAL LOW (ref 12.0–15.0)
Hemoglobin: 10.5 g/dL — ABNORMAL LOW (ref 12.0–15.0)
O2 Saturation: 82 %
O2 Saturation: 90 %
Patient temperature: 100.8
Patient temperature: 102.2
Potassium: 4.4 mmol/L (ref 3.5–5.1)
Potassium: 4.5 mmol/L (ref 3.5–5.1)
Sodium: 139 mmol/L (ref 135–145)
Sodium: 137 mmol/L (ref 135–145)
TCO2: 42 mmol/L — ABNORMAL HIGH (ref 22–32)
TCO2: 42 mmol/L — ABNORMAL HIGH (ref 22–32)
pCO2 arterial: 72.7 mmHg (ref 32.0–48.0)
pCO2 arterial: 63.2 mmHg — ABNORMAL HIGH (ref 32.0–48.0)
pH, Arterial: 7.352 (ref 7.350–7.450)
pH, Arterial: 7.42 (ref 7.350–7.450)
pO2, Arterial: 54 mmHg — ABNORMAL LOW (ref 83.0–108.0)
pO2, Arterial: 66 mmHg — ABNORMAL LOW (ref 83.0–108.0)

## 2019-06-07 LAB — BASIC METABOLIC PANEL
Anion gap: 16 — ABNORMAL HIGH (ref 5–15)
BUN: 42 mg/dL — ABNORMAL HIGH (ref 6–20)
CO2: 42 mmol/L — ABNORMAL HIGH (ref 22–32)
Calcium: 8 mg/dL — ABNORMAL LOW (ref 8.9–10.3)
Chloride: 82 mmol/L — ABNORMAL LOW (ref 98–111)
Creatinine, Ser: 0.86 mg/dL (ref 0.44–1.00)
GFR calc Af Amer: >60 mL/min (ref >60)
GFR calc non Af Amer: >60 mL/min (ref >60)
Glucose, Bld: 372 mg/dL — ABNORMAL HIGH (ref 70–99)
Potassium: 2.8 mmol/L — ABNORMAL LOW (ref 3.5–5.1)
Sodium: 140 mmol/L (ref 135–145)

## 2019-06-07 LAB — COMPREHENSIVE METABOLIC PANEL
ALT: 34 U/L (ref 0–44)
AST: 21 U/L (ref 15–41)
Albumin: 2.6 g/dL — ABNORMAL LOW (ref 3.5–5.0)
Alkaline Phosphatase: 102 U/L (ref 38–126)
Anion gap: 17 — ABNORMAL HIGH (ref 5–15)
BUN: 34 mg/dL — ABNORMAL HIGH (ref 6–20)
CO2: 40 mmol/L — ABNORMAL HIGH (ref 22–32)
Calcium: 8.2 mg/dL — ABNORMAL LOW (ref 8.9–10.3)
Chloride: 84 mmol/L — ABNORMAL LOW (ref 98–111)
Creatinine, Ser: 0.77 mg/dL (ref 0.44–1.00)
GFR calc Af Amer: >60 mL/min (ref >60)
GFR calc non Af Amer: >60 mL/min (ref >60)
Glucose, Bld: 198 mg/dL — ABNORMAL HIGH (ref 70–99)
Potassium: 2.8 mmol/L — ABNORMAL LOW (ref 3.5–5.1)
Sodium: 141 mmol/L (ref 135–145)
Total Bilirubin: 0.3 mg/dL (ref 0.3–1.2)
Total Protein: 6.1 g/dL — ABNORMAL LOW (ref 6.5–8.1)

## 2019-06-07 LAB — GLUCOSE, CAPILLARY
Glucose-Capillary: 191 mg/dL — ABNORMAL HIGH (ref 70–99)
Glucose-Capillary: 298 mg/dL — ABNORMAL HIGH (ref 70–99)

## 2019-06-07 LAB — CBC WITH DIFFERENTIAL/PLATELET
Abs Immature Granulocytes: 0.02 10*3/uL (ref 0.00–0.07)
Basophils Absolute: 0 10*3/uL (ref 0.0–0.1)
Basophils Relative: 0 %
Eosinophils Absolute: 0 10*3/uL (ref 0.0–0.5)
Eosinophils Relative: 0 %
HCT: 31.3 % — ABNORMAL LOW (ref 36.0–46.0)
Hemoglobin: 9.7 g/dL — ABNORMAL LOW (ref 12.0–15.0)
Immature Granulocytes: 0 %
Lymphocytes Relative: 9 %
Lymphs Abs: 0.4 10*3/uL — ABNORMAL LOW (ref 0.7–4.0)
MCH: 29.8 pg (ref 26.0–34.0)
MCHC: 31 g/dL (ref 30.0–36.0)
MCV: 96 fL (ref 80.0–100.0)
Monocytes Absolute: 0.4 10*3/uL (ref 0.1–1.0)
Monocytes Relative: 8 %
Neutro Abs: 3.8 10*3/uL (ref 1.7–7.7)
Neutrophils Relative %: 83 %
Platelets: 90 10*3/uL — ABNORMAL LOW (ref 150–400)
RBC: 3.26 MIL/uL — ABNORMAL LOW (ref 3.87–5.11)
RDW: 15.3 % (ref 11.5–15.5)
WBC Morphology: INCREASED
WBC: 4.5 10*3/uL (ref 4.0–10.5)
nRBC: 0 % (ref 0.0–0.2)

## 2019-06-07 LAB — INTERLEUKIN-6, PLASMA: Interleukin-6, Plasma: 20.7 pg/mL — ABNORMAL HIGH (ref 0.0–12.2)

## 2019-06-07 LAB — MAGNESIUM: Magnesium: 1.7 mg/dL (ref 1.7–2.4)

## 2019-06-07 LAB — D-DIMER, QUANTITATIVE: D-Dimer, Quant: 1.3 ug/mL-FEU — ABNORMAL HIGH (ref 0.00–0.50)

## 2019-06-07 LAB — FERRITIN: Ferritin: 1490 ng/mL — ABNORMAL HIGH (ref 11–307)

## 2019-06-07 LAB — C-REACTIVE PROTEIN: CRP: 10.8 mg/dL — ABNORMAL HIGH (ref <1.0)

## 2019-06-07 MED ORDER — MAGNESIUM SULFATE IN D5W 1-5 GM/100ML-% IV SOLN
1.0000 g | Freq: Once | INTRAVENOUS | Status: AC
  Administered 2019-06-07: 1 g via INTRAVENOUS
  Filled 2019-06-07: qty 100

## 2019-06-07 MED ORDER — DILTIAZEM HCL 25 MG/5ML IV SOLN
10.0000 mg | Freq: Once | INTRAVENOUS | Status: AC
  Administered 2019-06-07: 10 mg via INTRAVENOUS
  Filled 2019-06-07: qty 5

## 2019-06-07 MED ORDER — POTASSIUM CHLORIDE CRYS ER 20 MEQ PO TBCR
40.0000 meq | EXTENDED_RELEASE_TABLET | Freq: Once | ORAL | Status: DC
  Filled 2019-06-07: qty 2

## 2019-06-07 MED ORDER — DILTIAZEM HCL-DEXTROSE 100-5 MG/100ML-% IV SOLN (PREMIX)
5.0000 mg/h | INTRAVENOUS | Status: DC
  Filled 2019-06-07: qty 100

## 2019-06-07 MED ORDER — POTASSIUM CHLORIDE CRYS ER 20 MEQ PO TBCR
40.0000 meq | EXTENDED_RELEASE_TABLET | Freq: Three times a day (TID) | ORAL | Status: AC
  Administered 2019-06-07 (×3): 40 meq via ORAL
  Filled 2019-06-07 (×3): qty 2

## 2019-06-07 MED ORDER — DILTIAZEM HCL 100 MG IV SOLR
5.0000 mg/h | INTRAVENOUS | Status: DC
  Administered 2019-06-07: 5 mg/h via INTRAVENOUS
  Administered 2019-06-08: 10 mg/h via INTRAVENOUS
  Filled 2019-06-07 (×2): qty 100

## 2019-06-07 MED ORDER — APIXABAN 5 MG PO TABS
5.0000 mg | ORAL_TABLET | Freq: Two times a day (BID) | ORAL | Status: DC
  Administered 2019-06-07 – 2019-06-16 (×18): 5 mg via ORAL
  Filled 2019-06-07 (×18): qty 1

## 2019-06-07 NOTE — Progress Notes (Signed)
2055 Cardiazem drip started at 5mg  due to HR 110-140's. Pt only c/o rectal pain from constipation and wanting something to drink/ice chips. Fluid restriction once again explained.

## 2019-06-07 NOTE — Progress Notes (Signed)
[facility] for Apixaban Indication: atrial fibrillation  Allergies  Allergen Reactions  . Aspirin Hives    Patient Measurements: Height: 5\' 2"  (157.5 cm) Weight: (!) 320 lb (145.2 kg) IBW/kg (Calculated) : 50.1  Vital Signs: Temp: 98.4 F (36.9 C) (06/22 1641) Temp Source: Axillary (06/22 1641) BP: 122/68 (06/22 1641) Pulse Rate: 95 (06/22 1442)  Labs: Recent Labs    06/05/19 1410 06/05/19 1835 06/06/19 0508 06/07/19 0645  HGB 8.9*  --  9.2* 9.7*  HCT 30.9*  --  30.7* 31.3*  PLT 85*  --  84* 90*  CREATININE 0.82 0.83 0.78 0.77  CKTOTAL 143  --   --   --     Estimated Creatinine Clearance: 107.9 mL/min (by C-G formula based on SCr of 0.77 mg/dL).   Medical History: Past Medical History:  Diagnosis Date  . Arthritis    "I feel like it's everywhere" (09/24/2017  . Cellulitis and abscess of lower extremity 08/16/2016   right leg  . CHF (congestive heart failure) ([place])   . Chronic lower back pain   . COPD (chronic obstructive pulmonary disease) ([place])   . Depression   . Fibromyalgia   . Headache    "once q couple months now" (09/24/2017)  . Hyperlipidemia   . Hypertension   . HYPERTENSION, BENIGN SYSTEMIC 02/12/2007  . Memory disturbance   . Migraines 1970's - <2000   "they just went away"  . Neuropathy    "bad in my right hand and in both feet" (09/24/2017)  . Normal echocardiogram 05/30/05   suboptimal study  . Obesity, morbid (more than 100 lbs over ideal weight or BMI > 40) (HCC)    obese since childhood  . On home oxygen therapy    "4L; 24/7" (09/24/2017)  . Osteoarthritis   . Peripheral edema   . Pressure ulcer of foot    left  . Sepsis ([place])   . Shortness of breath dyspnea   . Sleep apnea 10/2014   "couldn't tolerate the mask" (09/24/2017)  . Type II diabetes mellitus [facility])     Assessment: 58 y/o F with a h/o COPD and CHF admitted for worsening respiratory failure and found to be positive for  SARS-CoV-2. Patient is currently on DVT prophylaxis with Lovenox 0.5 mg/kg/day with orders to transition to apixaban for new-onset atrial fibrillation. Last dose of Lovenox this AM.   CHA2DS2-VASc=4  Plan:  - Will order apixaban 5 mg bid.  - Will educate patient when able.  - Monitor renal function, CBC, signs of bleeding   Ulice Dash D 06/07/2019,6:33 PM

## 2019-06-07 NOTE — Discharge Instructions (Signed)

## 2019-06-07 NOTE — Progress Notes (Addendum)
PROGRESS NOTE                                                                                                                                                                                                             Patient Demographics:    Kathleen Roy, is a 58 y.o. female, DOB - 03-22-1961, TML:[id number]  Admit date - 06/07/2019   Admitting Physician Vianne Bulls, MD  Outpatient Primary MD for the patient is [name], MD  LOS - 3   Chief Complaint  Patient presents with  . Shortness of Breath    COVID positive        Brief Narrative   58 y.o. female with medical history significant for COPD, chronic respiratory failure, type 2 diabetes mellitus, hypertension, obstructive sleep apnea, and chronic diastolic CHF,  presenting to the emergency department from a nursing facility for evaluation of worsening lethargy and increased supplemental oxygen requirement., Was COVID-19 positive, as well work-up significant for aspiration pneumonia, worsening respiratory failure .    Subjective:    Kathleen Roy today reports her dyspnea has improved, otherwise denies any complaints   Assessment  & Plan :    Principal Problem:   Pneumonia Active Problems:   Major depressive disorder, recurrent episode (HCC)   COPD (chronic obstructive pulmonary disease) (HCC)   OSA (obstructive sleep apnea)   Obesity hypoventilation syndrome (HCC)   Essential hypertension   Type 2 diabetes mellitus (HCC)   Chronic pain of both knees   Acute on chronic respiratory failure with hypoxia and hypercapnia (HCC)   Acute respiratory disease due to COVID-19 virus   Sepsis -Sepsis present on admission, febrile, tachypneic, tachycardic, with increased work of breathing, and encephalopathy. -She is most likely related to pneumonia, likely aspiration, and COVID-19 infection. -Present with dense right lung base opacity, highly suspicious for aspiration pneumonia, as well she  was encephalopathic on presentation, empirically on azithromycin, Rocephin and vancomycin, I have switched Rocephin to Unasyn to cover for aspiration empirically, stopped IV vancomycin on admission, will DC azithromycin.  Acute on chronic hypoxic respiratory failure/hypercapnic respiratory failure -Baseline on 4 L nasal cannula, morning she is on 15 L high flow nasal cannula, on 10 L high flow nasal cannula yesterday, I have encouraged her strongly today to keep using incentive spirometry and flutter valve. -Respiratory failure appears to be  multifactorial, in the setting of aspiration pneumonia, significant volume overload , COPD exacerbation and COVID-19 infection. - she is DNR, no intubation, ABG showing hypercapnia, but appears to be compensated.  COVID 19 infection -Elevated inflammatory markers, with worsening respiratory failure, respiratory failure secondary to bacterial pneumonia and COVID-19 infection, given how frail she and at risk for rapid decompensation, -Need to trend inflammatory markers closely, they are trending up, will continue monitor closely. - she was started on Remdesivir 06/05/2019 ,  - will continue with IV Solu-Medrol,  - has elevated procalcitonin, and possible aspiration pneumonia , so will not give Actemra.  COPD with acute exacerbation  - She reports increased cough, increased sputum production, and wheezing. -Continue with IV Solu-Medrol - Continue ICS/LABA, Spiriva, and albuterol,  - continue supplemental O2, antibiotics as above  -Discussed with staff, will keep O2 saturation between 88 to 92%.  Acute on Chronic diastolic CHF  -With significant volume overload currently, even though Breeback BNP within normal limit, she is significantly hypervolemic on physical exam, she is on torsemide 40 twice daily at home, continue with current IV diuresis Lasix 80 mg IV every 8 hours, she is -4 L so far since admission, but still with evidence of significant volume  overload, I will restrict her fluid intake to 1200 cc, will monitor electrolytes and replete closely .  Hypokalemia -Due to diuresis, repleted  Acute metabolic encephalopathy -Initially altered most likely in the setting of sepsis, respiratory failure and tachypnea . -Resolved, back to baseline .    Hypertension  - BP at goal, discontinue Toprol as blood pressure on the lower side after diuresis  Type II DM  - A1c was only 5.5% last month and she has been diet-controlled recently  - BG better controlled after increasing her Levemir, and adding NovoLog before meals.   Leukopenia; thrombocytopenia  - Due to COVID-19 infection,  Elevated d-dimer  -This is secondary to COVID-19, will trend, she is on DVT prophylaxis  Addendum 6:35 PM : Developed A. fib with RVR, be started on Eliquis, as well will be started on Eliquis for anticoagulation, no hypokalemia due to diuresis, will replete, will repeat stat BMP and magnesium as well, she will be started on Cardizem drip.  COVID-19 Labs  Recent Labs    06/10/2019 1700 06/05/19 1410 06/06/19 0508 06/07/19 0645  DDIMER 1.09* 1.21* 1.37* 1.30*  FERRITIN 428* 629* 1,122* 1,490*  LDH 176  --   --   --   CRP 17.2* 22.4* 19.1* 10.8*    Lab Results  Component Value Date   SARSCOV2NAA POSITIVE (A) 06/09/2019   [place] NEGATIVE 05/22/2019   [place] NEGATIVE 05/15/2019   [place] NEGATIVE 05/14/2019     Code Status : DNR   Family Communication  : disussed with her friend [name] via phone per patient's request on 6/21  Disposition Plan  : remains in PCU  Consults  :  none  Procedures  : None   DVT Prophylaxis  :  Lamar lovenox  Lab Results  Component Value Date   PLT 90 (L) 06/07/2019    Antibiotics  :    Anti-infectives (From admission, onward)   Start     Dose/Rate Route Frequency Ordered Stop   06/06/19 1200  remdesivir 100 mg in sodium chloride 0.9 % 250 mL IVPB     100 mg 500 mL/hr over 30 Minutes  Intravenous Every 24 hours 06/05/19 1003 06/10/19 1159   06/05/19 1200  Ampicillin-Sulbactam (UNASYN) 3 g in sodium chloride 0.9 %  100 mL IVPB     3 g 200 mL/hr over 30 Minutes Intravenous Every 6 hours 06/05/19 0812     06/05/19 1200  remdesivir 200 mg in sodium chloride 0.9 % 250 mL IVPB     200 mg 500 mL/hr over 30 Minutes Intravenous Once 06/05/19 1003 06/05/19 1417   05/27/2019 2200  cefTRIAXone (ROCEPHIN) 2 g in sodium chloride 0.9 % 100 mL IVPB  Status:  Discontinued     2 g 200 mL/hr over 30 Minutes Intravenous Every 24 hours 05/19/2019 2118 06/05/19 0741   05/17/2019 2200  azithromycin (ZITHROMAX) 500 mg in sodium chloride 0.9 % 250 mL IVPB  Status:  Discontinued     500 mg 250 mL/hr over 60 Minutes Intravenous Every 24 hours 05/29/2019 2118 06/07/19 0723   05/28/2019 2000  vancomycin (VANCOCIN) 2,000 mg in sodium chloride 0.9 % 500 mL IVPB  Status:  Discontinued     2,000 mg 250 mL/hr over 120 Minutes Intravenous Every 24 hours 06/01/2019 1955 06/06/19 1127        Objective:   Vitals:   06/07/19 0630 06/07/19 0800 06/07/19 0820 06/07/19 1052  BP: (!) 103/45  106/61 111/74  Pulse: 98  88 91  Resp: (!) 26  19   Temp:  98.2 F (36.8 C)    TempSrc:      SpO2: 93%  (!) 89%   Weight:      Height:        Wt Readings from Last 3 Encounters:  05/27/2019 (!) 145.2 kg  05/23/19 (!) 144.2 kg  01/10/19 (!) 163.3 kg     Intake/Output Summary (Last 24 hours) at 06/07/2019 1154 Last data filed at 06/07/2019 0900 Gross per 24 hour  Intake 2615.07 ml  Output 4975 ml  Net -2359.93 ml     Physical Exam  Awake Alert, Oriented X 3, No new F.N deficits, Normal affect Symmetrical Chest wall movement, air entry at the bases, scattered rales and rhonchi RRR,No Gallops,Rubs or new Murmurs, No Parasternal Heave +ve B.Sounds, Abd Soft, No tenderness, No rebound - guarding or rigidity. No Cyanosis, Clubbing, +2 edema, No new Rash or bruise     Data Review:    CBC Recent Labs  Lab 06/01/2019  1700 05/28/2019 1951 06/05/19 0640 06/05/19 1023 06/05/19 1410 06/06/19 0508 06/07/19 0645  WBC 2.6* 5.6  --   --  4.6 4.3 4.5  HGB 16.5* 10.9* 11.2* 10.5* 8.9* 9.2* 9.7*  HCT 56.4* 37.5 33.0* 31.0* 30.9* 30.7* 31.3*  PLT 52* 99*  --   --  85* 84* 90*  MCV 101.3* 102.2*  --   --  100.0 98.1 96.0  MCH 29.6 29.7  --   --  28.8 29.4 29.8  MCHC 29.3* 29.1*  --   --  28.8* 30.0 31.0  RDW 16.7* 16.3*  --   --  16.1* 15.9* 15.3  LYMPHSABS 0.3* 0.5*  --   --  0.2* 0.3* 0.4*  MONOABS 0.1 0.3  --   --  0.1 0.2 0.4  EOSABS 0.0 0.0  --   --  0.0 0.0 0.0  BASOSABS 0.0 0.0  --   --  0.0 0.0 0.0    Chemistries  Recent Labs  Lab 05/23/2019 1700  06/05/19 1023 06/05/19 1410 06/05/19 1835 06/06/19 0508 06/07/19 0645  NA 143   < > 137 138 138 141 141  K 3.7   < > 4.5 3.7 3.4* 3.6 2.8*  CL 91*  --   --  90* 88* 90* 84*  CO2 38*  --   --  37* 36* 40* 40*  GLUCOSE 206*  --   --  447* 468* 231* 198*  BUN 21*  --   --  24* 27* 31* 34*  CREATININE 0.76  --   --  0.82 0.83 0.78 0.77  CALCIUM 8.9  --   --  8.1* 8.1* 8.2* 8.2*  MG  --   --   --   --   --  1.8  --   AST 26  --   --  21  --  20 21  ALT 64*  --   --  43  --  34 34  ALKPHOS 189*  --   --  135*  --  122 102  BILITOT 0.2*  --   --  0.3  --  0.6 0.3   < > = values in this interval not displayed.   ------------------------------------------------------------------------------------------------------------------ Recent Labs    06/01/2019 1700  TRIG 208*    Lab Results  Component Value Date   HGBA1C 5.5 05/16/2019   ------------------------------------------------------------------------------------------------------------------ No results for input(s): TSH, T4TOTAL, T3FREE, THYROIDAB in the last 72 hours.  Invalid input(s): FREET3 ------------------------------------------------------------------------------------------------------------------ Recent Labs    06/06/19 0508 06/07/19 0645  FERRITIN 1,122* 1,490*    Coagulation  profile No results for input(s): INR, PROTIME in the last 168 hours.  Recent Labs    06/06/19 0508 06/07/19 0645  DDIMER 1.37* 1.30*    Cardiac Enzymes No results for input(s): CKMB, TROPONINI, MYOGLOBIN in the last 168 hours.  Invalid input(s): CK ------------------------------------------------------------------------------------------------------------------    Component Value Date/Time   BNP 19.3 06/02/2019 1700    Inpatient Medications  Scheduled Meds: . ARIPiprazole  2 mg Oral QODAY  . enoxaparin (LOVENOX) injection  70 mg Subcutaneous Daily  . feeding supplement (PRO-STAT SUGAR FREE 64)  30 mL Oral BID  . fluticasone furoate-vilanterol  1 puff Inhalation Daily  . furosemide  80 mg Intravenous Q8H  . insulin aspart  0-20 Units Subcutaneous TID WC  . insulin aspart  0-5 Units Subcutaneous QHS  . insulin aspart  5 Units Subcutaneous TID WC  . insulin detemir  18 Units Subcutaneous BID  . lamoTRIgine  25 mg Oral Daily  . methylPREDNISolone (SOLU-MEDROL) injection  40 mg Intravenous Q8H  . metoprolol succinate  12.5 mg Oral Daily  . polyethylene glycol  17 g Oral QODAY  . potassium chloride  40 mEq Oral TID  . pravastatin  40 mg Oral q1800  . pregabalin  100 mg Oral BID  . senna  2 tablet Oral QHS  . sodium chloride flush  10-40 mL Intracatheter Q12H  . sodium chloride flush  3 mL Intravenous Q12H  . torsemide  40 mg Oral BID  . trazodone  300 mg Oral QHS  . umeclidinium bromide  1 puff Inhalation Daily  . venlafaxine XR  300 mg Oral Daily  . venlafaxine XR  37.5 mg Oral Daily   Continuous Infusions: . ampicillin-sulbactam (UNASYN) IV 3 g (06/07/19 0555)  . remdesivir 100 mg in NS 250 mL 100 mg (06/06/19 1355)   PRN Meds:.acetaminophen, albuterol, sodium chloride flush  Micro Results Recent Results (from the past 240 hour(s))  Blood Culture (routine x 2)     Status: None (Preliminary result)   Collection Time: 05/21/2019  5:00 PM   Specimen: BLOOD  Result  Value Ref Range Status   Specimen Description   Final    BLOOD LEFT  ARM Performed at [facility], [place] [address], [place], [place] [address]    Special Requests   Final    BOTTLES DRAWN AEROBIC AND ANAEROBIC Blood Culture adequate volume Performed at [facility] [address] [place], [place] [address]    Culture  Setup Time   Final    GRAM POSITIVE COCCI AEROBIC BOTTLE ONLY CRITICAL RESULT CALLED TO, READ BACK BY AND VERIFIED WITH: N.BATCHEOBER,PHARMD 0120 06/06/2019 M.CAMPBELL Performed at [facility], [place] [address], [place], [place] [address]    Culture GRAM POSITIVE COCCI  Final   Report Status PENDING  Incomplete  Blood Culture ID Panel (Reflexed)     Status: Abnormal   Collection Time: 05/24/2019  5:00 PM  Result Value Ref Range Status   Enterococcus species NOT DETECTED NOT DETECTED Final   Listeria monocytogenes NOT DETECTED NOT DETECTED Final   Staphylococcus species DETECTED (A) NOT DETECTED Final    Comment: Methicillin (oxacillin) resistant coagulase negative staphylococcus. Possible blood culture contaminant (unless isolated from more than one blood culture draw or clinical case suggests pathogenicity). No antibiotic treatment is indicated for blood  culture contaminants. CRITICAL RESULT CALLED TO, READ BACK BY AND VERIFIED WITH: N.BATCHEOBER,PHARMD 0120 06/06/2019 M.CAMPBELL    Staphylococcus aureus (BCID) NOT DETECTED NOT DETECTED Final   Methicillin resistance DETECTED (A) NOT DETECTED Final    Comment: CRITICAL RESULT CALLED TO, READ BACK BY AND VERIFIED WITH: N.BATCHEOBER,PHARMD 0120 06/06/2019 M.CAMPBELL    Streptococcus species NOT DETECTED NOT DETECTED Final   Streptococcus agalactiae NOT DETECTED NOT DETECTED Final   Streptococcus pneumoniae NOT DETECTED NOT DETECTED Final   Streptococcus pyogenes NOT DETECTED NOT DETECTED Final   Acinetobacter baumannii NOT DETECTED NOT DETECTED Final   Enterobacteriaceae  species NOT DETECTED NOT DETECTED Final   Enterobacter cloacae complex NOT DETECTED NOT DETECTED Final   Escherichia coli NOT DETECTED NOT DETECTED Final   Klebsiella oxytoca NOT DETECTED NOT DETECTED Final   Klebsiella pneumoniae NOT DETECTED NOT DETECTED Final   Proteus species NOT DETECTED NOT DETECTED Final   Serratia marcescens NOT DETECTED NOT DETECTED Final   Haemophilus influenzae NOT DETECTED NOT DETECTED Final   Neisseria meningitidis NOT DETECTED NOT DETECTED Final   Pseudomonas aeruginosa NOT DETECTED NOT DETECTED Final   Candida albicans NOT DETECTED NOT DETECTED Final   Candida glabrata NOT DETECTED NOT DETECTED Final   Candida krusei NOT DETECTED NOT DETECTED Final   Candida parapsilosis NOT DETECTED NOT DETECTED Final   Candida tropicalis NOT DETECTED NOT DETECTED Final    Comment: Performed at [facility], [place] [address], [place], [place] [address]  SARS Coronavirus 2 (CEPHEID- Performed in [place] hospital lab), Hosp Order     Status: Abnormal   Collection Time: 05/21/2019  7:51 PM   Specimen: Nasopharyngeal Swab  Result Value Ref Range Status   SARS Coronavirus 2 POSITIVE (A) NEGATIVE Final    Comment: RESULT CALLED TO, READ BACK BY AND VERIFIED WITH: RHONEY A @2246  ON 06/12/2019 JACKSON,K (NOTE) If result is NEGATIVE SARS-CoV-2 target nucleic acids are NOT DETECTED. The SARS-CoV-2 RNA is generally detectable in upper and lower  respiratory specimens during the acute phase of infection. The lowest  concentration of SARS-CoV-2 viral copies this assay can detect is 250  copies / mL. A negative result does not preclude SARS-CoV-2 infection  and should not be used as the sole basis for treatment or other  patient management decisions.  A negative result may occur with  improper specimen collection / handling, submission of specimen other  than nasopharyngeal swab, presence of viral mutation(s) within the  areas targeted by this assay, and inadequate number of  viral copies  (<250 copies / mL). A negative result must be combined with clinical  observations, patient history, and epidemiological information. If result is POSITIVE SARS-CoV-2 target nucleic acids are DETECTED.  The SARS-CoV-2 RNA is generally detectable in upper and lower  respiratory specimens during the acute phase of infection.  Positive  results are indicative of active infection with SARS-CoV-2.  Clinical  correlation with patient history and other diagnostic information is  necessary to determine patient infection status.  Positive results do  not rule out bacterial infection or co-infection with other viruses. If result is PRESUMPTIVE POSTIVE SARS-CoV-2 nucleic acids MAY BE PRESENT.   A presumptive positive result was obtained on the submitted specimen  and confirmed on repeat testing.  While 2019 novel coronavirus  (SARS-CoV-2) nucleic acids may be present in the submitted sample  additional confirmatory testing may be necessary for epidemiological  and / or clinical management purposes  to differentiate between  SARS-CoV-2 and other Sarbecovirus currently known to infect humans.  If clinically indicated additional testing with an alternate test  methodology [phone])  is advised. The SARS-CoV-2 RNA is generally  detectable in upper and lower respiratory specimens during the acute  phase of infection. The expected result is Negative. Fact Sheet for Patients:  StrictlyIdeas.no Fact Sheet for Healthcare Providers: BankingDealers.co.za This test is not yet approved or cleared by the [place] FDA and has been authorized for detection and/or diagnosis of SARS-CoV-2 by FDA under an Emergency Use Authorization (EUA).  This EUA will remain in effect (meaning this test can be used) for the duration of the COVID-19 declaration under Section 564(b)(1) of the Act, 21 U.S.C. section 360bbb-3(b)(1), unless the authorization is  terminated or revoked sooner. Performed at [facility], [place] [address] [place], [place] [address]   Culture, sputum-assessment     Status: None   Collection Time: 06/05/19 12:30 PM   Specimen: Sputum  Result Value Ref Range Status   Specimen Description SPUTUM  Final   Special Requests NONE  Final   Sputum evaluation   Final    THIS SPECIMEN IS ACCEPTABLE FOR SPUTUM CULTURE Performed at [facility] [place] [address], [place], [place] [address]    Report Status 06/05/2019 FINAL  Final  Culture, respiratory     Status: None (Preliminary result)   Collection Time: 06/05/19 12:30 PM   Specimen: SPU  Result Value Ref Range Status   Specimen Description   Final    SPUTUM Performed at [facility] [address], [place], [place], [place] [address]    Special Requests   Final    NONE Reflexed from S2110 Performed at [facility], [place] [address] [place], [place] [address]    Gram Stain   Final    FEW WBC PRESENT,BOTH PMN AND MONONUCLEAR FEW YEAST RARE GRAM POSITIVE RODS    Culture   Final    CULTURE REINCUBATED FOR BETTER GROWTH Performed at [facility], [place] [address], [place], [place] [address]    Report Status PENDING  Incomplete    Radiology Reports Ct Angio Chest Pe W Or Wo Contrast  Result Date: 05/15/2019 CLINICAL DATA:  Patient admitted 05/14/2019 for shortness of breath and decreased oxygen saturation. History of COPD. EXAM: CT ANGIOGRAPHY CHEST WITH CONTRAST TECHNIQUE: Multidetector CT  imaging of the chest was performed using the standard protocol during bolus administration of intravenous contrast. Multiplanar CT image reconstructions and MIPs were obtained to evaluate the vascular anatomy. CONTRAST:  100 mL OMNIPAQUE IOHEXOL 350 MG/ML SOLN COMPARISON:  Single-view of the chest 05/14/2019, 01/10/2019 and 11/26/2016. FINDINGS: Cardiovascular: The study is somewhat limited by bolus timing but no  pulmonary embolus is identified. There is cardiomegaly. No pericardial effusion. No aortic aneurysm. Mediastinum/Nodes: No lymphadenopathy. Thyromegaly is most notable in the left lobe. The esophagus is unremarkable. Lungs/Pleura: Small bilateral pleural effusions greater on the left. Dependent bilateral lower lobe airspace disease with air bronchograms is seen. Scattered ground-glass attenuation and mild interlobular septal thickening are identified. Upper Abdomen: There is fatty infiltration of the liver and hepatomegaly. The patient is status post cholecystectomy. Musculoskeletal: No acute or focal bony abnormality. Review of the MIP images confirms the above findings. IMPRESSION: Negative for pulmonary embolus. Dependent bilateral lower lobe airspace disease could be due to pneumonia, atelectasis or aspiration. Cardiomegaly and findings compatible with mild interstitial pulmonary edema. Small pleural effusions noted. Hepatomegaly and fatty infiltration of the liver. Electronically Signed   By: Inge Rise M.D.   On: 05/15/2019 15:03   Dg Chest Port 1 View  Result Date: 05/27/2019 CLINICAL DATA:  Increasing lethargy. COVID positive. Hypoxic. EXAM: PORTABLE CHEST 1 VIEW COMPARISON:  CT of the chest 05/15/2019 FINDINGS: Cardiomediastinal silhouette is normal. Mediastinal contours appear intact. Increasing dense airspace consolidation in the right lower lung field. No evidence of pneumothorax or pleural effusion. Osseous structures are without acute abnormality. Soft tissues are grossly normal. IMPRESSION: Increasing dense airspace consolidation in the right lower lung field. Electronically Signed   By: Fidela Salisbury M.D.   On: 05/20/2019 17:57   Dg Chest Portable 1 View  Result Date: 05/14/2019 CLINICAL DATA:  Low oxygen saturation. EXAM: PORTABLE CHEST 1 VIEW COMPARISON:  01/10/2019 and multiple previous FINDINGS: The patient is rotated towards the right as usual. Cardiac silhouette is enlarged.  There is diffuse pulmonary density which appear slightly more prominent than seen previously. This could reflect pulmonary edema superimposed on chronic lung disease. No dense consolidation or lobar collapse. IMPRESSION: Rotated. Cardiomegaly. Diffuse lung density, similar to previous studies but possibly slightly more pronounced. This could represent mild edema superimposed upon chronic lung disease. Electronically Signed   By: Nelson Chimes M.D.   On: 05/14/2019 19:11   [place] Ekg Site Rite  Result Date: 06/05/2019 If Site Rite image not attached, placement could not be confirmed due to current cardiac rhythm.   Time Spent in minutes 35 minutes   Phillips Climes M.D on 06/07/2019 at 11:54 AM  Between 7am to 7pm - Pager - [phone]  After 7pm go to [email or web address] - password [facility]  [facility] -  Office  [phone]

## 2019-06-07 NOTE — Evaluation (Signed)
Physical Therapy Evaluation Patient Details Name: Kathleen Roy MRN: [id number] DOB: [birth year] Today's Date: 06/07/2019   History of Present Illness  58 yo female admitted from SNF with increased respiratory distress, H/O morbid obesity, OSA DHF, HTN, DM,Myoclonus, fibromyalgia, depression.Positive for covid-19.  Clinical Impression  The patient is very sweet but  Significantly impaired in functional mobility and requires total assistance with  Bed mobility. Patient reports that at the [facility],   She is placed in a recliner via a mechanical lift but has not been OOB for some time. Unfortunately skilled PT in this venue is limited for progressing. Attempted placing patient in bed/chair position but patient reports pain in the  Back and legs is not tolerated.Tilted the bed forward for about 10 minutes for more upright position. PT will sign off. .    Follow Up Recommendations No PT follow up    Equipment Recommendations  None recommended by PT    Recommendations for Other Services       Precautions / Restrictions Precautions Precaution Comments: monitor sats      Mobility  Bed Mobility Overal bed mobility: Needs Assistance Bed Mobility: Rolling Rolling: Total assist;+2 for safety/equipment;+2 for physical assistance         General bed mobility comments: requies extensive assistanc for rolling and scooting up in bed. Patient is unable to assist.  Transfers                 General transfer comment: NT  Ambulation/Gait                Stairs            Wheelchair Mobility    Modified Rankin (Stroke Patients Only)       Balance                                             Pertinent Vitals/Pain Pain Assessment: Faces Faces Pain Scale: Hurts whole lot Pain Location: legs when repositioned, back when HOB raised Pain Descriptors / Indicators: Discomfort;Grimacing;Guarding Pain Intervention(s): Limited activity within patient's  tolerance;Monitored during session    Home Living Family/patient expects to be discharged to:: Skilled nursing facility                      Prior Function Level of Independence: Needs assistance   Gait / Transfers Assistance Needed: OOB via mechanical lift, has not been OOB for some time per patient.           Hand Dominance        Extremity/Trunk Assessment   Upper Extremity Assessment Upper Extremity Assessment: Defer to OT evaluation    Lower Extremity Assessment Lower Extremity Assessment: RLE deficits/detail;LLE deficits/detail RLE Deficits / Details: legs are limited for PROM and is not able to move the leg in bed. Noted edema and reddness of posterior lower leg and heel LLE Deficits / Details: similar to the right leg       Communication      Cognition Arousal/Alertness: Awake/alert Behavior During Therapy: WFL for tasks assessed/performed Overall Cognitive Status: Within Functional Limits for tasks assessed                                        General Comments  Exercises     Assessment/Plan    PT Assessment Patent does not need any further PT services;All further PT needs can be met in the next venue of care  PT Problem List Decreased strength;Decreased mobility;Decreased safety awareness;Decreased range of motion;Decreased activity tolerance;Obesity;Pain       PT Treatment Interventions      PT Goals (Current goals can be found in the Care Plan section)  Acute Rehab PT Goals Patient Stated Goal: to beable to feed self PT Goal Formulation: All assessment and education complete, DC therapy    Frequency     Barriers to discharge        Co-evaluation               AM-PAC PT "6 Clicks" Mobility  Outcome Measure Help needed turning from your back to your side while in a flat bed without using bedrails?: Total Help needed moving from lying on your back to sitting on the side of a flat bed without using  bedrails?: Total Help needed moving to and from a bed to a chair (including a wheelchair)?: Total Help needed standing up from a chair using your arms (e.g., wheelchair or bedside chair)?: Total Help needed to walk in hospital room?: Total Help needed climbing 3-5 steps with a railing? : Total 6 Click Score: 6    End of Session Equipment Utilized During Treatment: Oxygen Activity Tolerance: Patient limited by pain;Patient limited by fatigue Patient left: in bed;with call bell/phone within reach Nurse Communication: Mobility status;Need for lift equipment PT Visit Diagnosis: Muscle weakness (generalized) (M62.81)    Time: 3958-4417 PT Time Calculation (min) (ACUTE ONLY): 71 min   Charges:   PT Evaluation $PT Eval Moderate Complexity: 1 Mod PT Treatments $Therapeutic Activity: 8-22 mins $Self Care/Home Management: 23-37        [phone]  Claretha Cooper 06/07/2019, 5:25 PM

## 2019-06-07 NOTE — Progress Notes (Signed)
Cardiac monitoring called stated patient was in afib HR 110-140's at 1755. Patient with no complaints. EKG was done. Placed in chart. MD made aware. IV cardizem 10 mg was order and given. HR maintaining in high 90's. Stat B met and magnesium ordered and drawn. Will wait until labs return to give ordered potassium and magnesium per MD.   Will continue to monitor.

## 2019-06-07 NOTE — Progress Notes (Signed)
Inpatient Diabetes Program Recommendations  AACE/ADA: New Consensus Statement on Inpatient Glycemic Control (2015)  Target Ranges:  Prepandial:   less than 140 mg/dL      Peak postprandial:   less than 180 mg/dL (1-2 hours)      Critically ill patients:  140 - 180 mg/dL   Lab Results  Component Value Date   GLUCAP 191 (H) 06/07/2019   HGBA1C 5.5 05/16/2019    Review of Glycemic Control Results for Kathleen Roy, Kathleen Roy (MRN [id number]) as of 06/07/2019 12:39  Ref. Range 06/05/2019 11:36 06/05/2019 15:29 06/05/2019 21:05 06/06/2019 07:55 06/06/2019 11:43 06/06/2019 16:35 06/06/2019 19:51 06/07/2019 08:06  Glucose-Capillary Latest Ref Range: 70 - 99 mg/dL 305 (H) 462 (H) 408 (H) 193 (H) 488 (H) 432 (H) 435 (H) 191 (H)   Diabetes history: DM 2 Outpatient Diabetes medications:  None Current orders for Inpatient glycemic control:  Levemir 18 units bid, Novolog resistant tid with meals and HS, Novolog 5 units tid with meals Solumedrol 40 mg IV q 8 hours  Inpatient Diabetes Program Recommendations:    Please consider increasing Levemir to 24 units bid.  Also please increase Novolog meal coverage to 8 units tid with meals.    Thanks,  Adah Perl, RN, BC-ADM Inpatient Diabetes Coordinator Pager [phone] (8a-5p)

## 2019-06-08 ENCOUNTER — Other Ambulatory Visit: Payer: Self-pay

## 2019-06-08 DIAGNOSIS — J988 Other specified respiratory disorders: Secondary | ICD-10-CM

## 2019-06-08 DIAGNOSIS — I10 Essential (primary) hypertension: Secondary | ICD-10-CM

## 2019-06-08 LAB — CULTURE, BLOOD (ROUTINE X 2): Special Requests: ADEQUATE

## 2019-06-08 LAB — CBC WITH DIFFERENTIAL/PLATELET
Abs Immature Granulocytes: 0.01 10*3/uL (ref 0.00–0.07)
Basophils Absolute: 0 10*3/uL (ref 0.0–0.1)
Basophils Relative: 0 %
Eosinophils Absolute: 0 10*3/uL (ref 0.0–0.5)
Eosinophils Relative: 0 %
HCT: 33.3 % — ABNORMAL LOW (ref 36.0–46.0)
Hemoglobin: 10 g/dL — ABNORMAL LOW (ref 12.0–15.0)
Immature Granulocytes: 0 %
Lymphocytes Relative: 13 %
Lymphs Abs: 0.4 10*3/uL — ABNORMAL LOW (ref 0.7–4.0)
MCH: 29.2 pg (ref 26.0–34.0)
MCHC: 30 g/dL (ref 30.0–36.0)
MCV: 97.4 fL (ref 80.0–100.0)
Monocytes Absolute: 0.2 10*3/uL (ref 0.1–1.0)
Monocytes Relative: 8 %
Neutro Abs: 2.2 10*3/uL (ref 1.7–7.7)
Neutrophils Relative %: 79 %
Platelets: 85 10*3/uL — ABNORMAL LOW (ref 150–400)
RBC: 3.42 MIL/uL — ABNORMAL LOW (ref 3.87–5.11)
RDW: 15.1 % (ref 11.5–15.5)
WBC: 2.8 10*3/uL — ABNORMAL LOW (ref 4.0–10.5)
nRBC: 0 % (ref 0.0–0.2)

## 2019-06-08 LAB — COMPREHENSIVE METABOLIC PANEL
ALT: 32 U/L (ref 0–44)
AST: 20 U/L (ref 15–41)
Albumin: 2.5 g/dL — ABNORMAL LOW (ref 3.5–5.0)
Alkaline Phosphatase: 100 U/L (ref 38–126)
Anion gap: 14 (ref 5–15)
BUN: 43 mg/dL — ABNORMAL HIGH (ref 6–20)
CO2: 43 mmol/L — ABNORMAL HIGH (ref 22–32)
Calcium: 8.6 mg/dL — ABNORMAL LOW (ref 8.9–10.3)
Chloride: 85 mmol/L — ABNORMAL LOW (ref 98–111)
Creatinine, Ser: 0.84 mg/dL (ref 0.44–1.00)
GFR calc Af Amer: >60 mL/min (ref >60)
GFR calc non Af Amer: >60 mL/min (ref >60)
Glucose, Bld: 346 mg/dL — ABNORMAL HIGH (ref 70–99)
Potassium: 3.8 mmol/L (ref 3.5–5.1)
Sodium: 142 mmol/L (ref 135–145)
Total Bilirubin: 0.3 mg/dL (ref 0.3–1.2)
Total Protein: 6 g/dL — ABNORMAL LOW (ref 6.5–8.1)

## 2019-06-08 LAB — CULTURE, RESPIRATORY W GRAM STAIN: Culture: NORMAL

## 2019-06-08 LAB — GLUCOSE, CAPILLARY
Glucose-Capillary: 398 mg/dL — ABNORMAL HIGH (ref 70–99)
Glucose-Capillary: 347 mg/dL — ABNORMAL HIGH (ref 70–99)
Glucose-Capillary: 299 mg/dL — ABNORMAL HIGH (ref 70–99)
Glucose-Capillary: 332 mg/dL — ABNORMAL HIGH (ref 70–99)
Glucose-Capillary: 483 mg/dL — ABNORMAL HIGH (ref 70–99)
Glucose-Capillary: 450 mg/dL — ABNORMAL HIGH (ref 70–99)
Glucose-Capillary: 291 mg/dL — ABNORMAL HIGH (ref 70–99)

## 2019-06-08 LAB — C-REACTIVE PROTEIN: CRP: 6 mg/dL — ABNORMAL HIGH (ref <1.0)

## 2019-06-08 LAB — D-DIMER, QUANTITATIVE: D-Dimer, Quant: 0.81 ug/mL-FEU — ABNORMAL HIGH (ref 0.00–0.50)

## 2019-06-08 LAB — MAGNESIUM: Magnesium: 1.9 mg/dL (ref 1.7–2.4)

## 2019-06-08 LAB — FERRITIN: Ferritin: 771 ng/mL — ABNORMAL HIGH (ref 11–307)

## 2019-06-08 MED ORDER — AMIODARONE LOAD VIA INFUSION
150.0000 mg | Freq: Once | INTRAVENOUS | Status: AC
  Administered 2019-06-08: 150 mg via INTRAVENOUS
  Filled 2019-06-08: qty 83.34

## 2019-06-08 MED ORDER — INSULIN ASPART 100 UNIT/ML ~~LOC~~ SOLN
8.0000 [IU] | Freq: Three times a day (TID) | SUBCUTANEOUS | Status: DC
  Administered 2019-06-08 (×2): 8 [IU] via SUBCUTANEOUS

## 2019-06-08 MED ORDER — INSULIN DETEMIR 100 UNIT/ML ~~LOC~~ SOLN
40.0000 [IU] | Freq: Two times a day (BID) | SUBCUTANEOUS | Status: DC
  Administered 2019-06-08 – 2019-06-11 (×6): 40 [IU] via SUBCUTANEOUS
  Filled 2019-06-08 (×6): qty 0.4

## 2019-06-08 MED ORDER — AMIODARONE HCL IN DEXTROSE 360-4.14 MG/200ML-% IV SOLN
60.0000 mg/h | INTRAVENOUS | Status: AC
  Administered 2019-06-08: 60 mg/h via INTRAVENOUS
  Administered 2019-06-08: 18 mg/h via INTRAVENOUS
  Filled 2019-06-08 (×2): qty 200

## 2019-06-08 MED ORDER — METHYLPREDNISOLONE SODIUM SUCC 40 MG IJ SOLR
40.0000 mg | Freq: Two times a day (BID) | INTRAMUSCULAR | Status: DC
  Administered 2019-06-08 – 2019-06-10 (×4): 40 mg via INTRAVENOUS
  Filled 2019-06-08 (×4): qty 1

## 2019-06-08 MED ORDER — INSULIN DETEMIR 100 UNIT/ML ~~LOC~~ SOLN
28.0000 [IU] | Freq: Two times a day (BID) | SUBCUTANEOUS | Status: DC
  Administered 2019-06-08: 28 [IU] via SUBCUTANEOUS
  Filled 2019-06-08 (×2): qty 0.28

## 2019-06-08 MED ORDER — POTASSIUM CHLORIDE CRYS ER 20 MEQ PO TBCR
40.0000 meq | EXTENDED_RELEASE_TABLET | Freq: Once | ORAL | Status: AC
  Administered 2019-06-08: 40 meq via ORAL
  Filled 2019-06-08: qty 2

## 2019-06-08 MED ORDER — FUROSEMIDE 10 MG/ML IJ SOLN
80.0000 mg | Freq: Three times a day (TID) | INTRAMUSCULAR | Status: DC
  Administered 2019-06-08 – 2019-06-09 (×4): 80 mg via INTRAVENOUS
  Filled 2019-06-08 (×4): qty 8

## 2019-06-08 MED ORDER — MAGNESIUM SULFATE IN D5W 1-5 GM/100ML-% IV SOLN
1.0000 g | Freq: Once | INTRAVENOUS | Status: AC
  Administered 2019-06-08: 1 g via INTRAVENOUS
  Filled 2019-06-08: qty 100

## 2019-06-08 MED ORDER — ACETAZOLAMIDE 250 MG PO TABS
250.0000 mg | ORAL_TABLET | Freq: Three times a day (TID) | ORAL | Status: AC
  Administered 2019-06-08 – 2019-06-09 (×3): 250 mg via ORAL
  Filled 2019-06-08 (×4): qty 1

## 2019-06-08 MED ORDER — SODIUM CHLORIDE 0.9 % IV SOLN
3.0000 g | Freq: Four times a day (QID) | INTRAVENOUS | Status: DC
  Administered 2019-06-08 – 2019-06-11 (×12): 3 g via INTRAVENOUS
  Filled 2019-06-08 (×14): qty 3

## 2019-06-08 MED ORDER — AMIODARONE HCL IN DEXTROSE 360-4.14 MG/200ML-% IV SOLN
30.0000 mg/h | INTRAVENOUS | Status: DC
  Administered 2019-06-08 – 2019-06-11 (×6): 30 mg/h via INTRAVENOUS
  Filled 2019-06-08 (×11): qty 200

## 2019-06-08 MED ORDER — INSULIN ASPART 100 UNIT/ML ~~LOC~~ SOLN
14.0000 [IU] | Freq: Three times a day (TID) | SUBCUTANEOUS | Status: DC
  Administered 2019-06-08 – 2019-06-10 (×7): 14 [IU] via SUBCUTANEOUS

## 2019-06-08 NOTE — Progress Notes (Addendum)
PROGRESS NOTE                                                                                                                                                                                                             Patient Demographics:    Kathleen Roy, is a 58 y.o. female, DOB - [birth year], YYT:[id number]  Admit date - 06/08/2019   Admitting Physician Vianne Bulls, MD  Outpatient Primary MD for the patient is [name], MD  LOS - 4   Chief Complaint  Patient presents with   Shortness of Breath    COVID positive        Brief Narrative   58 y.o. female with medical history significant for COPD, chronic respiratory failure, type 2 diabetes mellitus, hypertension, obstructive sleep apnea, and chronic diastolic CHF,  presenting to the emergency department from a nursing facility for evaluation of worsening lethargy and increased supplemental oxygen requirement., Was COVID-19 positive, as well work-up significant for aspiration pneumonia, worsening respiratory failure, she developed A. fib with RVR 06/07/2019.    Subjective:    [name] and A. fib with RVR required Cardizem drip yesterday, reports good bowel movement, denies any chest pain, reports no significant change in her dyspnea  Assessment  & Plan :    Principal Problem:   Pneumonia Active Problems:   Major depressive disorder, recurrent episode (HCC)   COPD (chronic obstructive pulmonary disease) (HCC)   OSA (obstructive sleep apnea)   Obesity hypoventilation syndrome (HCC)   Essential hypertension   Type 2 diabetes mellitus (HCC)   Chronic pain of both knees   Acute on chronic respiratory failure with hypoxia and hypercapnia (HCC)   Acute respiratory disease due to COVID-19 virus   Sepsis -Sepsis present on admission, febrile, tachypneic, tachycardic, with increased work of breathing, and encephalopathy. -She is most likely related to pneumonia, likely aspiration, and  COVID-19 infection. -Present with dense right lung base opacity, highly suspicious for aspiration pneumonia, as well she was encephalopathic on presentation, empirically on azithromycin, Rocephin and vancomycin, she was switched to Unasyn. -Have discussed at length with the patient today, instructed her to eat while sitting up upright, as well to remain recumbent at least for an hour after finishing her meals, as well she was encouraged to use incentive spirometry  entry.  Acute on chronic hypoxic respiratory  failure/hypercapnic respiratory failure -Baseline on 4 L nasal cannula, quiring higher than baseline, she is on 11 L high flow nasal cannula today. -Respiratory failure appears to be multifactorial, in the setting of aspiration pneumonia, volume overload , COPD exacerbation and COVID-19 infection. - she is DNR, no intubation, ABG showing hypercapnia, but appears to be compensated.  COVID 19 infection -Elevated inflammatory markers, with worsening respiratory failure, respiratory failure secondary to bacterial pneumonia and COVID-19 infection, given how frail she and at risk for rapid decompensation, -[name] markers trending down which is reassuring, continue to monitor closely . - she was started on Remdesivir 06/05/2019 ,  - on IV solumedrol - has elevated procalcitonin, and possible aspiration pneumonia , so will not give Actemra.  COPD with acute exacerbation  - She reports increased cough, increased sputum production, and wheezing. - Continue with IV Solu-Medrol - Continue ICS/LABA, Spiriva, and albuterol,  - continue supplemental O2, antibiotics as above  -Discussed with staff, will keep O2 saturation between 88 to 92%.  A. fib with RVR -Developed A. fib with RVR 6/22 afternoon, requiring Cardizem drip for heart rate control, started on Eliquis, given soft blood pressure will be transitioned to amiodarone drip,  -Patient with pink color urine in Foley, will monitor closely as on  Eliquis, H&H stable  Acute on Chronic diastolic CHF  -With significant volume overload currently, even though BNP within normal limit, she is significantly hypervolemic on physical exam, -  she is on torsemide 40 twice daily at home, was on IV Lasix 80 mg IV every 8 hours yesterday, was held yesterday secondary to hypokalemia and low blood pressure , resumed again today on 680 mg IV every 8 hours, monitor daily weights, and continue with fluid restrictions . -Sodium bicarb elevated at 43 secondary to contraction alkalosis, will give acetazolamide x4 doses   Hypokalemia -Due to diuresis, repleted  Acute metabolic encephalopathy -Initially altered most likely in the setting of sepsis, respiratory failure and tachypnea . -Resolved, back to baseline .    Hypertension  - soft blood pressure, discontinue Toprol as blood pressure on the lower side after diuresis  Type II DM  - A1c was only 5.5% last month and she has been diet-controlled recently  -CBG remains uncontrolled, most likely due to steroids, I have increased her Levemir, as well I am lowering her steroids.   Leukopenia; thrombocytopenia  - Due to COVID-19 infection,  Elevated d-dimer  -This is secondary to COVID-19, will trend, she is on DVT prophylaxis   COVID-19 Labs  Recent Labs    06/06/19 0508 06/07/19 0645 06/08/19 0515  DDIMER 1.37* 1.30* 0.81*  FERRITIN 1,122* 1,490* 771*  CRP 19.1* 10.8* 6.0*    Lab Results  Component Value Date   SARSCOV2NAA POSITIVE (A) 06/02/2019   [place] NEGATIVE 05/22/2019   [place] NEGATIVE 05/15/2019   [place] NEGATIVE 05/14/2019     Code Status : DNR   Family Communication  : D/W patinet, disussed with her friend [name] via phone per patient's request on 6/21  Disposition Plan  : She is a resident at [facility], will need SNF placement on discharge(will need to find new SNF as per my discussion with Education officer, museum)  Consults  :  none  Procedures  : None    DVT Prophylaxis  :  [place] lovenox  Lab Results  Component Value Date   PLT 85 (L) 06/08/2019    Antibiotics  :    Anti-infectives (From admission, onward)   Start  Dose/Rate Route Frequency Ordered Stop   06/08/19 0800  Ampicillin-Sulbactam (UNASYN) 3 g in sodium chloride 0.9 % 100 mL IVPB     3 g 200 mL/hr over 30 Minutes Intravenous Every 6 hours 06/08/19 0622     06/06/19 1200  remdesivir 100 mg in sodium chloride 0.9 % 250 mL IVPB     100 mg 500 mL/hr over 30 Minutes Intravenous Every 24 hours 06/05/19 1003 06/10/19 1159   06/05/19 1200  Ampicillin-Sulbactam (UNASYN) 3 g in sodium chloride 0.9 % 100 mL IVPB  Status:  Discontinued     3 g 200 mL/hr over 30 Minutes Intravenous Every 6 hours 06/05/19 0812 06/08/19 0622   06/05/19 1200  remdesivir 200 mg in sodium chloride 0.9 % 250 mL IVPB     200 mg 500 mL/hr over 30 Minutes Intravenous Once 06/05/19 1003 06/05/19 1417   05/31/2019 2200  cefTRIAXone (ROCEPHIN) 2 g in sodium chloride 0.9 % 100 mL IVPB  Status:  Discontinued     2 g 200 mL/hr over 30 Minutes Intravenous Every 24 hours 06/11/2019 2118 06/05/19 0741   06/03/2019 2200  azithromycin (ZITHROMAX) 500 mg in sodium chloride 0.9 % 250 mL IVPB  Status:  Discontinued     500 mg 250 mL/hr over 60 Minutes Intravenous Every 24 hours 05/17/2019 2118 06/07/19 0723   06/05/2019 2000  vancomycin (VANCOCIN) 2,000 mg in sodium chloride 0.9 % 500 mL IVPB  Status:  Discontinued     2,000 mg 250 mL/hr over 120 Minutes Intravenous Every 24 hours 05/17/2019 1955 06/06/19 1127        Objective:   Vitals:   06/08/19 0029 06/08/19 0509 06/08/19 0559 06/08/19 0642  BP: 95/69 100/69 114/80 107/67  Pulse:    81  Resp:    19  Temp: 98.3 F (36.8 C)  97.7 F (36.5 C) (!) 97.4 F (36.3 C)  TempSrc: Oral  Axillary Oral  SpO2:    91%  Weight:      Height:        Wt Readings from Last 3 Encounters:  06/06/2019 (!) 145.2 kg  05/23/19 (!) 144.2 kg  01/10/19 (!) 163.3 kg     Intake/Output  Summary (Last 24 hours) at 06/08/2019 1147 Last data filed at 06/08/2019 0800 Gross per 24 hour  Intake 1173 ml  Output 3100 ml  Net -1927 ml     Physical Exam  Awake Alert, Oriented X 3, No new F.N deficits, Normal affect Symmetrical Chest wall movement, diminished air entry bilaterally, scattered rails, no wheezing today . Irregular irregular,No Gallops,Rubs or new Murmurs, No Parasternal Heave +ve B.Sounds, Abd Soft, No tenderness, No rebound - guarding or rigidity. No Cyanosis, Clubbing ,+2 edema, No new Rash or bruise      Data Review:    CBC Recent Labs  Lab 05/21/2019 1951  06/05/19 1023 06/05/19 1410 06/06/19 0508 06/07/19 0645 06/08/19 0515  WBC 5.6  --   --  4.6 4.3 4.5 2.8*  HGB 10.9*   < > 10.5* 8.9* 9.2* 9.7* 10.0*  HCT 37.5   < > 31.0* 30.9* 30.7* 31.3* 33.3*  PLT 99*  --   --  85* 84* 90* 85*  MCV 102.2*  --   --  100.0 98.1 96.0 97.4  MCH 29.7  --   --  28.8 29.4 29.8 29.2  MCHC 29.1*  --   --  28.8* 30.0 31.0 30.0  RDW 16.3*  --   --  16.1* 15.9* 15.3 15.1  LYMPHSABS 0.5*  --   --  0.2* 0.3* 0.4* 0.4*  MONOABS 0.3  --   --  0.1 0.2 0.4 0.2  EOSABS 0.0  --   --  0.0 0.0 0.0 0.0  BASOSABS 0.0  --   --  0.0 0.0 0.0 0.0   < > = values in this interval not displayed.    Chemistries  Recent Labs  Lab 06/12/2019 1700  06/05/19 1410 06/05/19 1835 06/06/19 0508 06/07/19 0645 06/07/19 1850 06/08/19 0515  NA 143   < > 138 138 141 141 140 142  K 3.7   < > 3.7 3.4* 3.6 2.8* 2.8* 3.8  CL 91*  --  90* 88* 90* 84* 82* 85*  CO2 38*  --  37* 36* 40* 40* 42* 43*  GLUCOSE 206*  --  447* 468* 231* 198* 372* 346*  BUN 21*  --  24* 27* 31* 34* 42* 43*  CREATININE 0.76  --  0.82 0.83 0.78 0.77 0.86 0.84  CALCIUM 8.9  --  8.1* 8.1* 8.2* 8.2* 8.0* 8.6*  MG  --   --   --   --  1.8  --  1.7 1.9  AST 26  --  21  --  20 21  --  20  ALT 64*  --  43  --  34 34  --  32  ALKPHOS 189*  --  135*  --  122 102  --  100  BILITOT 0.2*  --  0.3  --  0.6 0.3  --  0.3   < > =  values in this interval not displayed.   ------------------------------------------------------------------------------------------------------------------ No results for input(s): CHOL, HDL, LDLCALC, TRIG, CHOLHDL, LDLDIRECT in the last 72 hours.  Lab Results  Component Value Date   HGBA1C 5.5 05/16/2019   ------------------------------------------------------------------------------------------------------------------ No results for input(s): TSH, T4TOTAL, T3FREE, THYROIDAB in the last 72 hours.  Invalid input(s): FREET3 ------------------------------------------------------------------------------------------------------------------ Recent Labs    06/07/19 0645 06/08/19 0515  FERRITIN 1,490* 771*    Coagulation profile No results for input(s): INR, PROTIME in the last 168 hours.  Recent Labs    06/07/19 0645 06/08/19 0515  DDIMER 1.30* 0.81*    Cardiac Enzymes No results for input(s): CKMB, TROPONINI, MYOGLOBIN in the last 168 hours.  Invalid input(s): CK ------------------------------------------------------------------------------------------------------------------    Component Value Date/Time   BNP 19.3 06/14/2019 1700    Inpatient Medications  Scheduled Meds:  amiodarone  150 mg Intravenous Once   apixaban  5 mg Oral BID   ARIPiprazole  2 mg Oral QODAY   feeding supplement (PRO-STAT SUGAR FREE 64)  30 mL Oral BID   fluticasone furoate-vilanterol  1 puff Inhalation Daily   furosemide  80 mg Intravenous Q8H   insulin aspart  0-20 Units Subcutaneous TID WC   insulin aspart  0-5 Units Subcutaneous QHS   insulin aspart  8 Units Subcutaneous TID WC   insulin detemir  28 Units Subcutaneous BID   lamoTRIgine  25 mg Oral Daily   methylPREDNISolone (SOLU-MEDROL) injection  40 mg Intravenous Q12H   metoprolol succinate  12.5 mg Oral Daily   polyethylene glycol  17 g Oral QODAY   potassium chloride  40 mEq Oral Once   pravastatin  40 mg Oral  q1800   pregabalin  100 mg Oral BID   senna  2 tablet Oral QHS   sodium chloride flush  10-40 mL Intracatheter Q12H   sodium chloride flush  3 mL Intravenous Q12H   trazodone  300 mg Oral QHS   umeclidinium bromide  1 puff Inhalation Daily   venlafaxine XR  300 mg Oral Daily   venlafaxine XR  37.5 mg Oral Daily   Continuous Infusions:  amiodarone     Followed by   amiodarone     ampicillin-sulbactam (UNASYN) IV 3 g (06/08/19 0757)   remdesivir 100 mg in NS 250 mL 100 mg (06/07/19 1259)   PRN Meds:.acetaminophen, albuterol, sodium chloride flush  Micro Results Recent Results (from the past 240 hour(s))  Blood Culture (routine x 2)     Status: Abnormal   Collection Time: 05/20/2019  5:00 PM   Specimen: BLOOD  Result Value Ref Range Status   Specimen Description   Final    BLOOD LEFT ARM Performed at [facility], [place] [address] [place], [place] [address]    Special Requests   Final    BOTTLES DRAWN AEROBIC AND ANAEROBIC Blood Culture adequate volume Performed at [facility] [address] [place], [place] [address]    Culture  Setup Time   Final    GRAM POSITIVE COCCI AEROBIC BOTTLE ONLY CRITICAL RESULT CALLED TO, READ BACK BY AND VERIFIED WITH: N.BATCHEOBER,PHARMD 0120 06/06/2019 M.CAMPBELL    Culture (A)  Final    STAPHYLOCOCCUS SPECIES (COAGULASE NEGATIVE) THE SIGNIFICANCE OF ISOLATING THIS ORGANISM FROM A SINGLE SET OF BLOOD CULTURES WHEN MULTIPLE SETS ARE DRAWN IS UNCERTAIN. PLEASE NOTIFY THE MICROBIOLOGY DEPARTMENT WITHIN ONE WEEK IF SPECIATION AND SENSITIVITIES ARE REQUIRED. Performed at [facility], [place] [address] [place], [place] [address]    Report Status 06/08/2019 FINAL  Final  Blood Culture ID Panel (Reflexed)     Status: Abnormal   Collection Time: 05/17/2019  5:00 PM  Result Value Ref Range Status   Enterococcus species NOT DETECTED NOT DETECTED Final   Listeria monocytogenes NOT DETECTED NOT  DETECTED Final   Staphylococcus species DETECTED (A) NOT DETECTED Final    Comment: Methicillin (oxacillin) resistant coagulase negative staphylococcus. Possible blood culture contaminant (unless isolated from more than one blood culture draw or clinical case suggests pathogenicity). No antibiotic treatment is indicated for blood  culture contaminants. CRITICAL RESULT CALLED TO, READ BACK BY AND VERIFIED WITH: N.BATCHEOBER,PHARMD 0120 06/06/2019 M.CAMPBELL    Staphylococcus aureus (BCID) NOT DETECTED NOT DETECTED Final   Methicillin resistance DETECTED (A) NOT DETECTED Final    Comment: CRITICAL RESULT CALLED TO, READ BACK BY AND VERIFIED WITH: N.BATCHEOBER,PHARMD 0120 06/06/2019 M.CAMPBELL    Streptococcus species NOT DETECTED NOT DETECTED Final   Streptococcus agalactiae NOT DETECTED NOT DETECTED Final   Streptococcus pneumoniae NOT DETECTED NOT DETECTED Final   Streptococcus pyogenes NOT DETECTED NOT DETECTED Final   Acinetobacter baumannii NOT DETECTED NOT DETECTED Final   Enterobacteriaceae species NOT DETECTED NOT DETECTED Final   Enterobacter cloacae complex NOT DETECTED NOT DETECTED Final   Escherichia coli NOT DETECTED NOT DETECTED Final   Klebsiella oxytoca NOT DETECTED NOT DETECTED Final   Klebsiella pneumoniae NOT DETECTED NOT DETECTED Final   Proteus species NOT DETECTED NOT DETECTED Final   Serratia marcescens NOT DETECTED NOT DETECTED Final   Haemophilus influenzae NOT DETECTED NOT DETECTED Final   Neisseria meningitidis NOT DETECTED NOT DETECTED Final   Pseudomonas aeruginosa NOT DETECTED NOT DETECTED Final   Candida albicans NOT DETECTED NOT DETECTED Final   Candida glabrata NOT DETECTED NOT DETECTED Final   Candida krusei NOT DETECTED NOT DETECTED Final   Candida parapsilosis NOT DETECTED NOT DETECTED Final   Candida  tropicalis NOT DETECTED NOT DETECTED Final    Comment: Performed at [facility], [place] [address], [place], [place] [address]  SARS Coronavirus 2  (CEPHEID- Performed in [facility] hospital lab), Hosp Order     Status: Abnormal   Collection Time: 05/31/2019  7:51 PM   Specimen: Nasopharyngeal Swab  Result Value Ref Range Status   SARS Coronavirus 2 POSITIVE (A) NEGATIVE Final    Comment: RESULT CALLED TO, READ BACK BY AND VERIFIED WITH: RHONEY A @2246  ON 05/17/2019 JACKSON,K (NOTE) If result is NEGATIVE SARS-CoV-2 target nucleic acids are NOT DETECTED. The SARS-CoV-2 RNA is generally detectable in upper and lower  respiratory specimens during the acute phase of infection. The lowest  concentration of SARS-CoV-2 viral copies this assay can detect is 250  copies / mL. A negative result does not preclude SARS-CoV-2 infection  and should not be used as the sole basis for treatment or other  patient management decisions.  A negative result may occur with  improper specimen collection / handling, submission of specimen other  than nasopharyngeal swab, presence of viral mutation(s) within the  areas targeted by this assay, and inadequate number of viral copies  (<250 copies / mL). A negative result must be combined with clinical  observations, patient history, and epidemiological information. If result is POSITIVE SARS-CoV-2 target nucleic acids are DETECTED.  The SARS-CoV-2 RNA is generally detectable in upper and lower  respiratory specimens during the acute phase of infection.  Positive  results are indicative of active infection with SARS-CoV-2.  Clinical  correlation with patient history and other diagnostic information is  necessary to determine patient infection status.  Positive results do  not rule out bacterial infection or co-infection with other viruses. If result is PRESUMPTIVE POSTIVE SARS-CoV-2 nucleic acids MAY BE PRESENT.   A presumptive positive result was obtained on the submitted specimen  and confirmed on repeat testing.  While 2019 novel coronavirus  (SARS-CoV-2) nucleic acids may be present in the submitted  sample  additional confirmatory testing may be necessary for epidemiological  and / or clinical management purposes  to differentiate between  SARS-CoV-2 and other Sarbecovirus currently known to infect humans.  If clinically indicated additional testing with an alternate test  methodology [phone])  is advised. The SARS-CoV-2 RNA is generally  detectable in upper and lower respiratory specimens during the acute  phase of infection. The expected result is Negative. Fact Sheet for Patients:  StrictlyIdeas.no Fact Sheet for Healthcare Providers: BankingDealers.co.za This test is not yet approved or cleared by the [place] FDA and has been authorized for detection and/or diagnosis of SARS-CoV-2 by FDA under an Emergency Use Authorization (EUA).  This EUA will remain in effect (meaning this test can be used) for the duration of the COVID-19 declaration under Section 564(b)(1) of the Act, 21 U.S.C. section 360bbb-3(b)(1), unless the authorization is terminated or revoked sooner. Performed at [facility], [place] [address] [place], [place] [address]   Culture, sputum-assessment     Status: None   Collection Time: 06/05/19 12:30 PM   Specimen: Sputum  Result Value Ref Range Status   Specimen Description SPUTUM  Final   Special Requests NONE  Final   Sputum evaluation   Final    THIS SPECIMEN IS ACCEPTABLE FOR SPUTUM CULTURE Performed at [facility], [place] [address] [place], [place] [address]    Report Status 06/05/2019 FINAL  Final  Culture, respiratory     Status: None  Collection Time: 06/05/19 12:30 PM   Specimen: SPU  Result Value Ref Range Status   Specimen Description   Final    SPUTUM Performed at [facility] [address] [place], [place] [address]    Special Requests   Final    NONE Reflexed from S2110 Performed at [facility], [place]  [address], [place], [place] [address]    Gram Stain   Final    FEW WBC PRESENT,BOTH PMN AND MONONUCLEAR FEW YEAST RARE GRAM POSITIVE RODS    Culture   Final    Consistent with normal respiratory flora. Performed at [facility], [place] [address] [place], [place] [address]    Report Status 06/08/2019 FINAL  Final    Radiology Reports Ct Angio Chest Pe W Or Wo Contrast  Result Date: 05/15/2019 CLINICAL DATA:  Patient admitted 05/14/2019 for shortness of breath and decreased oxygen saturation. History of COPD. EXAM: CT ANGIOGRAPHY CHEST WITH CONTRAST TECHNIQUE: Multidetector CT imaging of the chest was performed using the standard protocol during bolus administration of intravenous contrast. Multiplanar CT image reconstructions and MIPs were obtained to evaluate the vascular anatomy. CONTRAST:  100 mL OMNIPAQUE IOHEXOL 350 MG/ML SOLN COMPARISON:  Single-view of the chest 05/14/2019, 01/10/2019 and 11/26/2016. FINDINGS: Cardiovascular: The study is somewhat limited by bolus timing but no pulmonary embolus is identified. There is cardiomegaly. No pericardial effusion. No aortic aneurysm. Mediastinum/Nodes: No lymphadenopathy. Thyromegaly is most notable in the left lobe. The esophagus is unremarkable. Lungs/Pleura: Small bilateral pleural effusions greater on the left. Dependent bilateral lower lobe airspace disease with air bronchograms is seen. Scattered ground-glass attenuation and mild interlobular septal thickening are identified. Upper Abdomen: There is fatty infiltration of the liver and hepatomegaly. The patient is status post cholecystectomy. Musculoskeletal: No acute or focal bony abnormality. Review of the MIP images confirms the above findings. IMPRESSION: Negative for pulmonary embolus. Dependent bilateral lower lobe airspace disease could be due to pneumonia, atelectasis or aspiration. Cardiomegaly and findings compatible with mild interstitial pulmonary edema. Small pleural effusions  noted. Hepatomegaly and fatty infiltration of the liver. Electronically Signed   By: Inge Rise M.D.   On: 05/15/2019 15:03   Dg Chest Port 1 View  Result Date: 06/05/2019 CLINICAL DATA:  Increasing lethargy. COVID positive. Hypoxic. EXAM: PORTABLE CHEST 1 VIEW COMPARISON:  CT of the chest 05/15/2019 FINDINGS: Cardiomediastinal silhouette is normal. Mediastinal contours appear intact. Increasing dense airspace consolidation in the right lower lung field. No evidence of pneumothorax or pleural effusion. Osseous structures are without acute abnormality. Soft tissues are grossly normal. IMPRESSION: Increasing dense airspace consolidation in the right lower lung field. Electronically Signed   By: Fidela Salisbury M.D.   On: 05/17/2019 17:57   Dg Chest Portable 1 View  Result Date: 05/14/2019 CLINICAL DATA:  Low oxygen saturation. EXAM: PORTABLE CHEST 1 VIEW COMPARISON:  01/10/2019 and multiple previous FINDINGS: The patient is rotated towards the right as usual. Cardiac silhouette is enlarged. There is diffuse pulmonary density which appear slightly more prominent than seen previously. This could reflect pulmonary edema superimposed on chronic lung disease. No dense consolidation or lobar collapse. IMPRESSION: Rotated. Cardiomegaly. Diffuse lung density, similar to previous studies but possibly slightly more pronounced. This could represent mild edema superimposed upon chronic lung disease. Electronically Signed   By: Nelson Chimes M.D.   On: 05/14/2019 19:11   [place] Ekg Site Rite  Result Date: 06/05/2019 If Site Rite image not attached, placement could not be confirmed  due to current cardiac rhythm.   Time Spent in minutes 35 minutes   Phillips Climes M.D on 06/08/2019 at 11:47 AM  Between 7am to 7pm - Pager - [phone]  After 7pm go to [email or web address] - password [email or web address]  [facility] -  Office  [phone]

## 2019-06-08 NOTE — Progress Notes (Signed)
Pharmacy Antibiotic Note  Kathleen Roy is a 58 y.o. female admitted on 06/05/2019 with COVID-19 pneumonia and suspected aspiration pneumonia.  Pharmacy managing Unasyn dosing. On D#4 of Unasyn. WBC down to 2.8 today. Afebrile. Culture negative so far.   Plan: Unasyn 3g IV q6h F/u LOT. 7-day course should be sufficient if clinically improved    Height: 5\' 2"  (157.5 cm) Weight: (!) 320 lb (145.2 kg) IBW/kg (Calculated) : 50.1  Temp (24hrs), Avg:97.9 F (36.6 C), Min:97.4 F (36.3 C), Max:98.4 F (36.9 C)  Recent Labs  Lab 06/15/2019 1630  06/12/2019 1951 06/05/19 1410 06/05/19 1835 06/06/19 0508 06/07/19 0645 06/07/19 1850 06/08/19 0515  WBC  --    < > 5.6 4.6  --  4.3 4.5  --  2.8*  CREATININE  --    < >  --  0.82 0.83 0.78 0.77 0.86 0.84  LATICACIDVEN 1.2  --   --   --   --   --   --   --   --    < > = values in this interval not displayed.    Estimated Creatinine Clearance: 102.8 mL/min (by C-G formula based on SCr of 0.84 mg/dL).    Allergies  Allergen Reactions  . Aspirin Hives   Antimicrobials this admission: 6/19 Vanc >> 6/21 6/19 Ceftriaxone >> 6/20 6/19 Azithromycin >>  6/19 Unasyn >>   Dose adjustments this admission:  Microbiology results: 6/19 SARS Coronavirus 2: positive 6/19 BCx: 1/2 Coag neg staph   Prev Cx 5/29 MRSA PCR: pos 5/29 & 5/30 Covid: neg 5/30 Resp panel: neg  Thank you for allowing pharmacy to be a part of this patient's care.  Albertina Parr, PharmD., BCPS Clinical Pharmacist Clinical phone for 06/08/19 until 5pm: [phone] If after 3:30pm, please refer to [facility] for unit-specific pharmacist

## 2019-06-08 NOTE — NC FL2 (Signed)
[place] LEVEL OF CARE SCREENING TOOL     IDENTIFICATION  [name] Birthdate: 1961/07/15 Sex: female Admission Date (Current Location): 05/18/2019  [facility] and [place] Number:  Herbalist and Address:  [facility] [place] [place] [address], [place], [place] [address]      Provider Number: [id number]  Attending Physician Name and Address:  Elgergawy, Silver Huguenin, MD  Relative Name and Phone Number:  [name] [phone]    Current Level of Care: Hospital Recommended Level of Care: [place] Prior Approval Number:    Date Approved/Denied:   PASRR Number: [id number]  Discharge Plan: SNF    Current Diagnoses: Patient Active Problem List   Diagnosis Date Noted  . Acute respiratory disease due to COVID-19 virus 05/27/2019  . Acute on chronic diastolic congestive heart failure ([place])   . Hypernatremia   . Acute on chronic respiratory failure with hypoxia and hypercapnia ([place]) 05/14/2019  . Diabetes mellitus without complication ([place]) 41/93/7902  . Macrocytic anemia 05/14/2019  . Acute metabolic encephalopathy 40/97/3532  . Candidal dermatitis 09/25/2017  . Requires daily assistance for activities of daily living (ADL) and comfort needs   . Weakness 09/24/2017  . Other fatigue   . Altered mental status 09/20/2017  . 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 Allergic rhinitis 08/15/2017  . Immobility 08/15/2017  . Fibromyalgia 06/16/2017  . Chronic pain of both knees   . Acute respiratory failure with hypercapnia ([place]) 10/15/2016  . Type 2 diabetes mellitus ([place]) 02/14/2016  . Chronic diastolic congestive heart failure ([place])   . Essential hypertension   . Pneumonia 01/15/2016  . OSA (obstructive sleep apnea)   . Obesity hypoventilation syndrome ([place])   . Chronic respiratory failure with hypoxia ([place])   . Hyperglycemia 11/08/2014  . Memory loss 03/18/2014  . Hypokalemia 08/19/2013  . Venous stasis dermatitis of  both lower extremities 12/02/2012  . COPD (chronic obstructive pulmonary disease) ([place]) 10/15/2011  . Peripheral neuropathy ([place]) 10/15/2011  . BACK PAIN, LUMBAR, CHRONIC 01/22/2011  . Morbid obesity with BMI of 70 and over, adult ([place]) 11/16/2007  . Major depressive disorder, recurrent episode ([place]) 02/12/2007    Orientation RESPIRATION BLADDER Height & Weight     Self, Place, Situation  O2(HFNC 10L/min) Continent, External catheter Weight: (!) 320 lb (145.2 kg) Height:  5\' 2"  (157.5 cm)  BEHAVIORAL SYMPTOMS/MOOD NEUROLOGICAL BOWEL NUTRITION STATUS      Incontinent Diet(see discharge summary)  AMBULATORY STATUS COMMUNICATION OF NEEDS Skin   Extensive Assist Verbally (contact dermatitis on legs and feet, cracking on feet, ecchymosis on arms and legs, excoriated breast, buttocks, and perineum, MASD on buttocks and perineum)                       Personal Care Assistance Level of Assistance  Bathing, Dressing, Total care, Feeding Bathing Assistance: Maximum assistance Feeding assistance: Maximum assistance Dressing Assistance: Maximum assistance Total Care Assistance: Maximum assistance   Functional Limitations Info  Sight, Hearing, Speech Sight Info: Impaired Hearing Info: Impaired Speech Info: Adequate    SPECIAL CARE FACTORS FREQUENCY  PT (By licensed PT), OT (By licensed OT)     PT Frequency: min 5x weekly OT Frequency: min 5x weekly            Contractures Contractures Info: Not present    Additional Factors Info  Code Status, Allergies, Isolation Precautions Code Status Info: DNR Allergies Info: Aspirin     Isolation Precautions Info: MRSA,  emerging pathogen air and contact precaution     Current Medications (06/08/2019):  This is the current hospital active medication list Current Facility-Administered Medications  Medication Dose Route Frequency Provider Last Rate Last Dose  . acetaminophen (TYLENOL) tablet 650 mg  650 mg Oral Q6H PRN Elgergawy,  Silver Huguenin, MD   650 mg at 06/07/19 2230  . albuterol (PROVENTIL) (2.5 MG/3ML) 0.083% nebulizer solution 3 mL  3 mL Inhalation Q4H PRN Opyd, Ilene Qua, MD      . amiodarone (NEXTERONE PREMIX) 360-4.14 MG/200ML-% (1.8 mg/mL) IV infusion  60 mg/hr Intravenous Continuous Elgergawy, Silver Huguenin, MD 10 mL/hr at 06/08/19 1303 18 mg/hr at 06/08/19 1303   Followed by  . amiodarone (NEXTERONE PREMIX) 360-4.14 MG/200ML-% (1.8 mg/mL) IV infusion  30 mg/hr Intravenous Continuous Elgergawy, Silver Huguenin, MD      . Ampicillin-Sulbactam (UNASYN) 3 g in sodium chloride 0.9 % 100 mL IVPB  3 g Intravenous Q6H Elgergawy, Silver Huguenin, MD 200 mL/hr at 06/08/19 0757 3 g at 06/08/19 0757  . apixaban (ELIQUIS) tablet 5 mg  5 mg Oral BID Elgergawy, Silver Huguenin, MD   5 mg at 06/08/19 1255  . ARIPiprazole (ABILIFY) tablet 2 mg  2 mg Oral QODAY Opyd, Ilene Qua, MD   2 mg at 06/08/19 1254  . feeding supplement (PRO-STAT SUGAR FREE 64) liquid 30 mL  30 mL Oral BID Opyd, Ilene Qua, MD   30 mL at 06/08/19 1256  . fluticasone furoate-vilanterol (BREO ELLIPTA) 100-25 MCG/INH 1 puff  1 puff Inhalation Daily Opyd, Ilene Qua, MD   1 puff at 06/08/19 0801  . furosemide (LASIX) injection 80 mg  80 mg Intravenous Q8H Elgergawy, Dawood S, MD      . insulin aspart (novoLOG) injection 0-20 Units  0-20 Units Subcutaneous TID WC Elgergawy, Silver Huguenin, MD   20 Units at 06/08/19 1300  . insulin aspart (novoLOG) injection 0-5 Units  0-5 Units Subcutaneous QHS Elgergawy, Silver Huguenin, MD   4 Units at 06/07/19 2221  . insulin aspart (novoLOG) injection 8 Units  8 Units Subcutaneous TID WC Elgergawy, Silver Huguenin, MD   8 Units at 06/08/19 1314  . insulin detemir (LEVEMIR) injection 28 Units  28 Units Subcutaneous BID Elgergawy, Silver Huguenin, MD   28 Units at 06/08/19 0855  . lamoTRIgine (LAMICTAL) tablet 25 mg  25 mg Oral Daily Opyd, Ilene Qua, MD   25 mg at 06/08/19 1255  . methylPREDNISolone sodium succinate (SOLU-MEDROL) 40 mg/mL injection 40 mg  40 mg Intravenous Q12H  Elgergawy, Silver Huguenin, MD      . metoprolol succinate (TOPROL-XL) 24 hr tablet 12.5 mg  12.5 mg Oral Daily Opyd, Ilene Qua, MD   12.5 mg at 06/08/19 1254  . polyethylene glycol (MIRALAX / GLYCOLAX) packet 17 g  17 g Oral QODAY Opyd, Ilene Qua, MD   17 g at 06/07/19 1052  . potassium chloride SA (K-DUR) CR tablet 40 mEq  40 mEq Oral Once Elgergawy, Silver Huguenin, MD      . pravastatin (PRAVACHOL) tablet 40 mg  40 mg Oral q1800 Opyd, Ilene Qua, MD   40 mg at 06/07/19 1802  . pregabalin (LYRICA) capsule 100 mg  100 mg Oral BID Opyd, Ilene Qua, MD   100 mg at 06/08/19 1255  . remdesivir 100 mg in sodium chloride 0.9 % 250 mL IVPB  100 mg Intravenous Q24H Elgergawy, Silver Huguenin, MD 500 mL/hr at 06/07/19 1259 100 mg at 06/07/19 1259  . senna (SENOKOT) tablet 17.2  mg  2 tablet Oral QHS Opyd, Ilene Qua, MD   17.2 mg at 06/07/19 2230  . sodium chloride flush (NS) 0.9 % injection 10-40 mL  10-40 mL Intracatheter Q12H Elgergawy, Silver Huguenin, MD   10 mL at 06/06/19 2209  . sodium chloride flush (NS) 0.9 % injection 10-40 mL  10-40 mL Intracatheter PRN Elgergawy, Silver Huguenin, MD      . sodium chloride flush (NS) 0.9 % injection 3 mL  3 mL Intravenous Q12H Opyd, Ilene Qua, MD   3 mL at 06/07/19 2246  . traZODone (DESYREL) tablet 300 mg  300 mg Oral QHS Opyd, Ilene Qua, MD   300 mg at 06/07/19 2230  . umeclidinium bromide (INCRUSE ELLIPTA) 62.5 MCG/INH 1 puff  1 puff Inhalation Daily Opyd, Ilene Qua, MD   1 puff at 06/08/19 0802  . venlafaxine XR (EFFEXOR-XR) 24 hr capsule 300 mg  300 mg Oral Daily Opyd, Ilene Qua, MD   300 mg at 06/08/19 1254  . venlafaxine XR (EFFEXOR-XR) 24 hr capsule 37.5 mg  37.5 mg Oral Daily Opyd, Ilene Qua, MD   37.5 mg at 06/08/19 1255     Discharge Medications: Please see discharge summary for a list of discharge medications.  Relevant Imaging Results:  Relevant Lab Results:   Additional Information SSN: [id number]  Alberteen Sam, [name]

## 2019-06-08 NOTE — Evaluation (Signed)
Occupational Therapy Evaluation Patient Details Name: Kathleen Roy MRN: [id number] DOB: 08/21/1961 Today's Date: 06/08/2019    History of Present Illness 58 yo female admitted from SNF with increased respiratory distress, H/O morbid obesity, OSA DHF, HTN, DM,Myoclonus, fibromyalgia, depression.Positive for covid-19.   Clinical Impression   PTA, pt was living at [facility] and required Total A for ADLS at bedlevel, used a hoyer lift for transfers OOB, and performed self feeding with AE. Pt currently requiring Max-Total A for bathing and toileting at bedlevel. Pt performing self feeding in bed with optimized upright position, adapted spoon, and support at right elbow. Community education officer and NT on compensatory techniques used for breakfast.  Pt would benefit from weight spoon for self feeding. Pt would benefit from further acute OT to facilitate safe dc. Recommend dc to SNF for further OT to optimize safety, independence with ADLs, and return to PLOF.      Follow Up Recommendations  SNF;Supervision/Assistance - 24 hour    Equipment Recommendations  None recommended by OT    Recommendations for Other Services       Precautions / Restrictions Precautions Precaution Comments: monitor sats Restrictions Weight Bearing Restrictions: No      Mobility Bed Mobility Overal bed mobility: Needs Assistance             General bed mobility comments: Total A for repositioning in bed  Transfers                      Balance                                           ADL either performed or assessed with clinical judgement   ADL Overall ADL's : Needs assistance/impaired Eating/Feeding: Minimal assistance;Cueing for sequencing;Cueing for compensatory techinques;With adaptive utensils;With assist to don/doff brace/orthosis;Bed level   Grooming: Minimal assistance;Bed level   Upper Body Bathing: Maximal assistance;Bed level   Lower Body Bathing: Total assistance;Bed  level   Upper Body Dressing : Maximal assistance;Bed level   Lower Body Dressing: Total assistance;Bed level   Toilet Transfer: Total assistance           Functional mobility during ADLs: Total assistance General ADL Comments: Pt presenting nearbaseline fucntion. Max-Total A for bathing and toileting. Focused session on self feeding as pt performs self feeding at baseline. Pt benefiting from AE and optimize position in bed. Adapting spoon to increase handle size and weight for decreased grasp strength and tremors. Positioning pillow at right elbow to decrease tremors.     Vision Baseline Vision/History: Wears glasses Wears Glasses: At all times Patient Visual Report: No change from baseline       Perception     Praxis      Pertinent Vitals/Pain Pain Assessment: Faces Faces Pain Scale: Hurts little more Pain Location: Back,  BLE Pain Descriptors / Indicators: Discomfort;Grimacing;Guarding Pain Intervention(s): Monitored during session;Limited activity within patient's tolerance;Repositioned     Hand Dominance Right   Extremity/Trunk Assessment Upper Extremity Assessment Upper Extremity Assessment: RUE deficits/detail;LUE deficits/detail RUE Deficits / Details: Decreased ROM at shoulders. Poor graps strength and FM coordination.  RUE Coordination: decreased fine motor;decreased gross motor LUE Deficits / Details: Similar to right, poor ROM and strength. Able to hold bowl and bring towards mouth during self feeding. LUE Coordination: decreased gross motor;decreased fine motor   Lower Extremity Assessment Lower Extremity Assessment:  Defer to PT evaluation   Cervical / Trunk Assessment Cervical / Trunk Assessment: Other exceptions Cervical / Trunk Exceptions: Increased body habitus   Communication Communication Communication: HOH   Cognition Arousal/Alertness: Awake/alert Behavior During Therapy: WFL for tasks assessed/performed Overall Cognitive Status: Within  Functional Limits for tasks assessed                                     General Comments  SpO2 87-83% on 11L O2. Cues for purse lip breathing    Exercises     Shoulder Instructions      Home Living Family/patient expects to be discharged to:: Skilled nursing facility                                 Additional Comments: Kathleen Roy      Prior Functioning/Environment Level of Independence: Needs assistance  Gait / Transfers Assistance Needed: OOB via mechanical lift, has not been OOB for some time per patient. ADL's / Homemaking Assistance Needed: assist for bathing and toileting at bedlevel            OT Problem List: Decreased strength;Decreased range of motion;Decreased activity tolerance;Impaired balance (sitting and/or standing);Decreased knowledge of use of DME or AE;Decreased knowledge of precautions;Impaired UE functional use;Pain;Obesity      OT Treatment/Interventions: Self-care/ADL training;Therapeutic exercise;Energy conservation;DME and/or AE instruction;Therapeutic activities;Patient/family education    OT Goals(Current goals can be found in the care plan section) Acute Rehab OT Goals Patient Stated Goal: "Be able to feed myself" OT Goal Formulation: With patient Time For Goal Achievement: 06/22/19 Potential to Achieve Goals: Good  OT Frequency: Min 2X/week   Barriers to D/C:            Co-evaluation              AM-PAC OT "6 Clicks" Daily Activity     Outcome Measure Help from another person eating meals?: A Little Help from another person taking care of personal grooming?: A Little Help from another person toileting, which includes using toliet, bedpan, or urinal?: Total Help from another person bathing (including washing, rinsing, drying)?: Total Help from another person to put on and taking off regular upper body clothing?: A Lot Help from another person to put on and taking off regular lower body clothing?:  Total 6 Click Score: 11   End of Session Equipment Utilized During Treatment: Oxygen Nurse Communication: Mobility status(Needing to use bedpain)  Activity Tolerance: Patient tolerated treatment well Patient left: in bed;with call bell/phone within reach;with nursing/sitter in room  OT Visit Diagnosis: Unsteadiness on feet (R26.81);Other abnormalities of gait and mobility (R26.89);Muscle weakness (generalized) (M62.81);Pain Pain - Right/Left: (Bilateral) Pain - part of body: Leg(Back; rectum)                Time: 6979-4801 OT Time Calculation (min): 46 min Charges:  OT General Charges $OT Visit: 1 Visit OT Evaluation $OT Eval Moderate Complexity: 1 Mod OT Treatments $Self Care/Home Management : 23-37 mins  [place], OTR/L Acute Rehab Pager: [phone] Office: [place] 06/08/2019, 9:36 AM

## 2019-06-09 ENCOUNTER — Inpatient Hospital Stay (HOSPITAL_COMMUNITY): Payer: Medicare Other

## 2019-06-09 LAB — CBC WITH DIFFERENTIAL/PLATELET
Abs Immature Granulocytes: 0.03 10*3/uL (ref 0.00–0.07)
Basophils Absolute: 0 10*3/uL (ref 0.0–0.1)
Basophils Relative: 0 %
Eosinophils Absolute: 0 10*3/uL (ref 0.0–0.5)
Eosinophils Relative: 0 %
HCT: 34.7 % — ABNORMAL LOW (ref 36.0–46.0)
Hemoglobin: 10.2 g/dL — ABNORMAL LOW (ref 12.0–15.0)
Immature Granulocytes: 1 %
Lymphocytes Relative: 9 %
Lymphs Abs: 0.3 10*3/uL — ABNORMAL LOW (ref 0.7–4.0)
MCH: 28.8 pg (ref 26.0–34.0)
MCHC: 29.4 g/dL — ABNORMAL LOW (ref 30.0–36.0)
MCV: 98 fL (ref 80.0–100.0)
Monocytes Absolute: 0.3 10*3/uL (ref 0.1–1.0)
Monocytes Relative: 9 %
Neutro Abs: 3 10*3/uL (ref 1.7–7.7)
Neutrophils Relative %: 81 %
Platelets: 122 10*3/uL — ABNORMAL LOW (ref 150–400)
RBC: 3.54 MIL/uL — ABNORMAL LOW (ref 3.87–5.11)
RDW: 15.1 % (ref 11.5–15.5)
WBC: 3.7 10*3/uL — ABNORMAL LOW (ref 4.0–10.5)
nRBC: 0 % (ref 0.0–0.2)

## 2019-06-09 LAB — COMPREHENSIVE METABOLIC PANEL
ALT: 32 U/L (ref 0–44)
AST: 17 U/L (ref 15–41)
Albumin: 2.6 g/dL — ABNORMAL LOW (ref 3.5–5.0)
Alkaline Phosphatase: 100 U/L (ref 38–126)
Anion gap: 13 (ref 5–15)
BUN: 43 mg/dL — ABNORMAL HIGH (ref 6–20)
CO2: 44 mmol/L — ABNORMAL HIGH (ref 22–32)
Calcium: 9.2 mg/dL (ref 8.9–10.3)
Chloride: 86 mmol/L — ABNORMAL LOW (ref 98–111)
Creatinine, Ser: 0.79 mg/dL (ref 0.44–1.00)
GFR calc Af Amer: >60 mL/min (ref >60)
GFR calc non Af Amer: >60 mL/min (ref >60)
Glucose, Bld: 247 mg/dL — ABNORMAL HIGH (ref 70–99)
Potassium: 3.6 mmol/L (ref 3.5–5.1)
Sodium: 143 mmol/L (ref 135–145)
Total Bilirubin: 0.3 mg/dL (ref 0.3–1.2)
Total Protein: 5.9 g/dL — ABNORMAL LOW (ref 6.5–8.1)

## 2019-06-09 LAB — GLUCOSE, CAPILLARY
Glucose-Capillary: 258 mg/dL — ABNORMAL HIGH (ref 70–99)
Glucose-Capillary: 491 mg/dL — ABNORMAL HIGH (ref 70–99)
Glucose-Capillary: 369 mg/dL — ABNORMAL HIGH (ref 70–99)
Glucose-Capillary: 335 mg/dL — ABNORMAL HIGH (ref 70–99)

## 2019-06-09 LAB — MAGNESIUM: Magnesium: 2.2 mg/dL (ref 1.7–2.4)

## 2019-06-09 LAB — C-REACTIVE PROTEIN: CRP: 3.2 mg/dL — ABNORMAL HIGH (ref <1.0)

## 2019-06-09 LAB — FERRITIN: Ferritin: 524 ng/mL — ABNORMAL HIGH (ref 11–307)

## 2019-06-09 LAB — D-DIMER, QUANTITATIVE: D-Dimer, Quant: 0.63 ug/mL-FEU — ABNORMAL HIGH (ref 0.00–0.50)

## 2019-06-09 MED ORDER — SODIUM CHLORIDE 0.9 % IV SOLN
INTRAVENOUS | Status: DC | PRN
  Administered 2019-06-09: 15:00:00 via INTRAVENOUS

## 2019-06-09 MED ORDER — FUROSEMIDE 10 MG/ML IJ SOLN
80.0000 mg | Freq: Two times a day (BID) | INTRAMUSCULAR | Status: DC
  Administered 2019-06-10: 80 mg via INTRAVENOUS
  Filled 2019-06-09: qty 8

## 2019-06-09 NOTE — Progress Notes (Signed)
PROGRESS NOTE  Kathleen Roy:[id number] DOB: 1961/11/06 DOA: 05/29/2019  PCP: [name], MD  Brief History/Interval Summary: 58 y.o.femalewith medical history significant forCOPD, chronic respiratory failure, type 2 diabetes mellitus, hypertension, obstructive sleep apnea, and chronic diastolic CHF,  presenting to the emergency department from a nursing facility for evaluation of worsening lethargy and increased supplemental oxygen requirement., Was COVID-19 positive, as well work-up significant for aspiration pneumonia, worsening respiratory failure, she developed A. fib with RVR 06/07/2019.   Reason for Visit: Acute respiratory failure with hypoxia  Consultants: None  Procedures: PICC line in right upper extremity  Antibiotics: Currently on Unasyn  Subjective/Interval History: Patient's denies any chest pain.  Still having some difficulty breathing with occasional cough.  She is extremely concerned about her condition.  She denies any nausea vomiting.  She was encouraged to be upright when she is eating and drinking.    Assessment/Plan:  Acute Hypoxic Resp. Failure due to Acute Covid 19 Viral Illness Patient had elevated inflammatory markers.  She had high oxygen requirements.  Patient was hospitalized.  She was started on Remdesivir and IV steroids.  She will complete course of Remdesivir today.  She was also noted to have elevated procalcitonin and there was concern for aspiration pneumonia.  As a result she was not given Actemra.  She was actually placed on antibacterials.  CRP has improved to 3.2.  D-dimer 0.63.  Ferritin 524.  COVID-19 Labs  Recent Labs    06/07/19 0645 06/08/19 0515 06/09/19 0517  DDIMER 1.30* 0.81* 0.63*  FERRITIN 1,490* 771* 524*  CRP 10.8* 6.0* 3.2*    Lab Results  Component Value Date   SARSCOV2NAA POSITIVE (A) 05/26/2019   [place] NEGATIVE 05/22/2019   [place] NEGATIVE 05/15/2019   [place] NEGATIVE 05/14/2019     Sepsis, present on admission Thought to be due to aspiration pneumonia and COVID-19 infection.  Patient had a dense right lung base opacity.  Patient was empirically on ceftriaxone azithromycin and vancomycin.  She was switched over to Unasyn.  Part of the problem appears to be that the patient tends to be more supine when she is eating and drinking.  She was told to be as upright as possible.  She says this is difficult due to her back issues.  Acute on chronic hypoxic respiratory failure/hypercapnic respiratory failure At baseline she is on 4 L of oxygen by nasal cannula.  Currently on 8 to 9 L of oxygen.  This is multifactorial due to aspiration pneumonia, pneumonia due to COVID-19, COPD and volume overload.  Continue to monitor closely.  Atrial fibrillation with RVR Patient went into atrial fibrillation with RVR on 6/22.  Initially on Cardizem infusion.  Now on amiodarone infusion.  She is also on Eliquis.  She does not have a previously known history of atrial fibrillation.  Continue to monitor on telemetry.  History of COPD with acute exacerbation Continue with Solu-Medrol.  She is on supplemental oxygen.  Antibiotics as above.  Continue inhalers.  Acute on chronic diastolic CHF Thought to have significant volume overload even though BNP was normal.  Patient takes torsemide 40 mg twice daily at home.  She was switched over to intravenous Lasix 80 mg every 8 hours.  She appears to be diuresing well.  Weights have not been checked.  Repeat chest x-ray today.  She was noted to have contraction alkalosis and was given acetazolamide.  May need to cut back on the dose of furosemide.  Hypokalemia Potassium is noted to be  normal today.  Essential hypertension Blood pressures were soft.  Metoprolol was discontinued..  Continue to monitor.  Acute metabolic encephalopathy Most likely due to acute illness.  Seems to be back to her baseline now.  Morbid obesity BMI 58.53  Diabetes mellitus type  2 Currently on long-acting insulin.  Monitor CBGs.  HbA1c was 5.5 in May.  Elevated glucose levels most likely due to steroids.  Leukopenia and mild thrombocytopenia Continue to monitor closely.  Normocytic anemia No evidence of overt bleeding.  DVT Prophylaxis: On apixaban PUD Prophylaxis: Pepcid Code Status: DNR Family Communication: Discussed with the patient Disposition Plan: She is here from a skilled nursing facility.  Hopefully discharge in 2 to 3 days.   Medications:  Scheduled: . acetaZOLAMIDE  250 mg Oral TID  . apixaban  5 mg Oral BID  . ARIPiprazole  2 mg Oral QODAY  . feeding supplement (PRO-STAT SUGAR FREE 64)  30 mL Oral BID  . fluticasone furoate-vilanterol  1 puff Inhalation Daily  . furosemide  80 mg Intravenous Q8H  . insulin aspart  0-20 Units Subcutaneous TID WC  . insulin aspart  0-5 Units Subcutaneous QHS  . insulin aspart  14 Units Subcutaneous TID WC  . insulin detemir  40 Units Subcutaneous BID  . lamoTRIgine  25 mg Oral Daily  . methylPREDNISolone (SOLU-MEDROL) injection  40 mg Intravenous Q12H  . metoprolol succinate  12.5 mg Oral Daily  . polyethylene glycol  17 g Oral QODAY  . potassium chloride  40 mEq Oral Once  . pravastatin  40 mg Oral q1800  . pregabalin  100 mg Oral BID  . senna  2 tablet Oral QHS  . sodium chloride flush  10-40 mL Intracatheter Q12H  . sodium chloride flush  3 mL Intravenous Q12H  . trazodone  300 mg Oral QHS  . umeclidinium bromide  1 puff Inhalation Daily  . venlafaxine XR  300 mg Oral Daily  . venlafaxine XR  37.5 mg Oral Daily   Continuous: . amiodarone 30 mg/hr (06/09/19 1122)  . ampicillin-sulbactam (UNASYN) IV Stopped (06/09/19 1036)  . remdesivir 100 mg in NS 250 mL Stopped (06/08/19 1644)   WCH:ENIDPOEUMPNTI, albuterol, sodium chloride flush   Objective:  Vital Signs  Vitals:   06/09/19 0600 06/09/19 0756 06/09/19 0758 06/09/19 1006  BP:  119/69  (!) 126/99  Pulse:  (!) 101  (!) 106  Resp:  (!)  23    Temp: 97.9 F (36.6 C)  97.8 F (36.6 C)   TempSrc: Axillary  Axillary   SpO2:  (!) 86%    Weight:      Height:        Intake/Output Summary (Last 24 hours) at 06/09/2019 1127 Last data filed at 06/09/2019 1122 Gross per 24 hour  Intake 2801.34 ml  Output 4350 ml  Net -1548.66 ml   Filed Weights   05/26/2019 1444  Weight: (!) 145.2 kg    General appearance: Awake alert.  In no distress.  Morbidly obese Resp: Mildly tachypneic at rest.  Diminished air entry at the bases with few crackles.  Difficult exam due to body habitus.   Cardio: S1-S2 is irregularly irregular.  No S3-S4. GI: Abdomen is soft.  Nontender nondistended.  Bowel sounds are present normal.  No masses organomegaly Extremities: No edema.  Full range of motion of lower extremities. Neurologic: Alert and oriented x3.  No focal neurological deficits.    Lab Results:  Data Reviewed: I have personally reviewed following labs and  imaging studies  CBC: Recent Labs  Lab 06/05/19 1410 06/06/19 0508 06/07/19 0645 06/08/19 0515 06/09/19 0517  WBC 4.6 4.3 4.5 2.8* 3.7*  NEUTROABS 4.3 3.8 3.8 2.2 3.0  HGB 8.9* 9.2* 9.7* 10.0* 10.2*  HCT 30.9* 30.7* 31.3* 33.3* 34.7*  MCV 100.0 98.1 96.0 97.4 98.0  PLT 85* 84* 90* 85* 122*    Basic Metabolic Panel: Recent Labs  Lab 06/06/19 0508 06/07/19 0645 06/07/19 1850 06/08/19 0515 06/09/19 0517  NA 141 141 140 142 143  K 3.6 2.8* 2.8* 3.8 3.6  CL 90* 84* 82* 85* 86*  CO2 40* 40* 42* 43* 44*  GLUCOSE 231* 198* 372* 346* 247*  BUN 31* 34* 42* 43* 43*  CREATININE 0.78 0.77 0.86 0.84 0.79  CALCIUM 8.2* 8.2* 8.0* 8.6* 9.2  MG 1.8  --  1.7 1.9 2.2    GFR: Estimated Creatinine Clearance: 107.9 mL/min (by C-G formula based on SCr of 0.79 mg/dL).  Liver Function Tests: Recent Labs  Lab 06/05/19 1410 06/06/19 0508 06/07/19 0645 06/08/19 0515 06/09/19 0517  AST 21 20 21 20 17   ALT 43 34 34 32 32  ALKPHOS 135* 122 102 100 100  BILITOT 0.3 0.6 0.3 0.3 0.3   PROT 5.8* 6.2* 6.1* 6.0* 5.9*  ALBUMIN 2.7* 2.8* 2.6* 2.5* 2.6*     Cardiac Enzymes: Recent Labs  Lab 06/05/19 1410  CKTOTAL 143    Anemia Panel: Recent Labs    06/08/19 0515 06/09/19 0517  FERRITIN 771* 524*    Recent Results (from the past 240 hour(s))  Blood Culture (routine x 2)     Status: Abnormal   Collection Time: 05/31/2019  5:00 PM   Specimen: BLOOD  Result Value Ref Range Status   Specimen Description   Final    BLOOD LEFT ARM Performed at [facility], [place] [address], [place], [place] [address]    Special Requests   Final    BOTTLES DRAWN AEROBIC AND ANAEROBIC Blood Culture adequate volume Performed at [facility] [address], [place], [place] [address]    Culture  Setup Time   Final    GRAM POSITIVE COCCI AEROBIC BOTTLE ONLY CRITICAL RESULT CALLED TO, READ BACK BY AND VERIFIED WITH: N.BATCHEOBER,PHARMD 0120 06/06/2019 M.CAMPBELL    Culture (A)  Final    STAPHYLOCOCCUS SPECIES (COAGULASE NEGATIVE) THE SIGNIFICANCE OF ISOLATING THIS ORGANISM FROM A SINGLE SET OF BLOOD CULTURES WHEN MULTIPLE SETS ARE DRAWN IS UNCERTAIN. PLEASE NOTIFY THE MICROBIOLOGY DEPARTMENT WITHIN ONE WEEK IF SPECIATION AND SENSITIVITIES ARE REQUIRED. Performed at [facility], [place] [address] [place], [place] [address]    Report Status 06/08/2019 FINAL  Final  Blood Culture ID Panel (Reflexed)     Status: Abnormal   Collection Time: 06/03/2019  5:00 PM  Result Value Ref Range Status   Enterococcus species NOT DETECTED NOT DETECTED Final   Listeria monocytogenes NOT DETECTED NOT DETECTED Final   Staphylococcus species DETECTED (A) NOT DETECTED Final    Comment: Methicillin (oxacillin) resistant coagulase negative staphylococcus. Possible blood culture contaminant (unless isolated from more than one blood culture draw or clinical case suggests pathogenicity). No antibiotic treatment is indicated for blood  culture contaminants. CRITICAL  RESULT CALLED TO, READ BACK BY AND VERIFIED WITH: N.BATCHEOBER,PHARMD 0120 06/06/2019 M.CAMPBELL    Staphylococcus aureus (BCID) NOT DETECTED NOT DETECTED Final   Methicillin resistance DETECTED (A) NOT DETECTED Final    Comment: CRITICAL RESULT CALLED TO, READ BACK BY AND VERIFIED WITH: N.BATCHEOBER,PHARMD  0120 06/06/2019 M.CAMPBELL    Streptococcus species NOT DETECTED NOT DETECTED Final   Streptococcus agalactiae NOT DETECTED NOT DETECTED Final   Streptococcus pneumoniae NOT DETECTED NOT DETECTED Final   Streptococcus pyogenes NOT DETECTED NOT DETECTED Final   Acinetobacter baumannii NOT DETECTED NOT DETECTED Final   Enterobacteriaceae species NOT DETECTED NOT DETECTED Final   Enterobacter cloacae complex NOT DETECTED NOT DETECTED Final   Escherichia coli NOT DETECTED NOT DETECTED Final   Klebsiella oxytoca NOT DETECTED NOT DETECTED Final   Klebsiella pneumoniae NOT DETECTED NOT DETECTED Final   Proteus species NOT DETECTED NOT DETECTED Final   Serratia marcescens NOT DETECTED NOT DETECTED Final   Haemophilus influenzae NOT DETECTED NOT DETECTED Final   Neisseria meningitidis NOT DETECTED NOT DETECTED Final   Pseudomonas aeruginosa NOT DETECTED NOT DETECTED Final   Candida albicans NOT DETECTED NOT DETECTED Final   Candida glabrata NOT DETECTED NOT DETECTED Final   Candida krusei NOT DETECTED NOT DETECTED Final   Candida parapsilosis NOT DETECTED NOT DETECTED Final   Candida tropicalis NOT DETECTED NOT DETECTED Final    Comment: Performed at [facility], [place] [address], [place], [place] [address]  SARS Coronavirus 2 (CEPHEID- Performed in [facility] hospital lab), Hosp Order     Status: Abnormal   Collection Time: 05/17/2019  7:51 PM   Specimen: Nasopharyngeal Swab  Result Value Ref Range Status   SARS Coronavirus 2 POSITIVE (A) NEGATIVE Final    Comment: RESULT CALLED TO, READ BACK BY AND VERIFIED WITH: RHONEY A @2246  ON 06/10/2019 JACKSON,K (NOTE) If result is NEGATIVE  SARS-CoV-2 target nucleic acids are NOT DETECTED. The SARS-CoV-2 RNA is generally detectable in upper and lower  respiratory specimens during the acute phase of infection. The lowest  concentration of SARS-CoV-2 viral copies this assay can detect is 250  copies / mL. A negative result does not preclude SARS-CoV-2 infection  and should not be used as the sole basis for treatment or other  patient management decisions.  A negative result may occur with  improper specimen collection / handling, submission of specimen other  than nasopharyngeal swab, presence of viral mutation(s) within the  areas targeted by this assay, and inadequate number of viral copies  (<250 copies / mL). A negative result must be combined with clinical  observations, patient history, and epidemiological information. If result is POSITIVE SARS-CoV-2 target nucleic acids are DETECTED.  The SARS-CoV-2 RNA is generally detectable in upper and lower  respiratory specimens during the acute phase of infection.  Positive  results are indicative of active infection with SARS-CoV-2.  Clinical  correlation with patient history and other diagnostic information is  necessary to determine patient infection status.  Positive results do  not rule out bacterial infection or co-infection with other viruses. If result is PRESUMPTIVE POSTIVE SARS-CoV-2 nucleic acids MAY BE PRESENT.   A presumptive positive result was obtained on the submitted specimen  and confirmed on repeat testing.  While 2019 novel coronavirus  (SARS-CoV-2) nucleic acids may be present in the submitted sample  additional confirmatory testing may be necessary for epidemiological  and / or clinical management purposes  to differentiate between  SARS-CoV-2 and other Sarbecovirus currently known to infect humans.  If clinically indicated additional testing with an alternate test  methodology [phone])  is advised. The SARS-CoV-2 RNA is generally  detectable in upper  and lower respiratory specimens during the acute  phase of infection. The expected result is Negative. Fact Sheet for Patients:  StrictlyIdeas.no Fact Sheet for Healthcare Providers: BankingDealers.co.za This test is not yet approved or cleared by the [place] FDA and has been authorized for detection and/or diagnosis of SARS-CoV-2 by FDA under an Emergency Use Authorization (EUA).  This EUA will remain in effect (meaning this test can be used) for the duration of the COVID-19 declaration under Section 564(b)(1) of the Act, 21 U.S.C. section 360bbb-3(b)(1), unless the authorization is terminated or revoked sooner. Performed at [facility], [place] [address], [place], [place] [address]   Culture, sputum-assessment     Status: None   Collection Time: 06/05/19 12:30 PM   Specimen: Sputum  Result Value Ref Range Status   Specimen Description SPUTUM  Final   Special Requests NONE  Final   Sputum evaluation   Final    THIS SPECIMEN IS ACCEPTABLE FOR SPUTUM CULTURE Performed at [facility], [place] [address], [place], [place] [address]    Report Status 06/05/2019 FINAL  Final  Culture, respiratory     Status: None   Collection Time: 06/05/19 12:30 PM   Specimen: SPU  Result Value Ref Range Status   Specimen Description   Final    SPUTUM Performed at [facility] [address], [place], [place] [address]    Special Requests   Final    NONE Reflexed from S2110 Performed at [facility], [place] [address], [place], [place] [address]    Gram Stain   Final    FEW WBC PRESENT,BOTH PMN AND MONONUCLEAR FEW YEAST RARE GRAM POSITIVE RODS    Culture   Final    Consistent with normal respiratory flora. Performed at [facility], [place] [address] [place], [place] [address]    Report Status 06/08/2019 FINAL  Final      Radiology Studies: No results found.      LOS: 5 days   Xaine Sansom [employer] on [email or web address]  06/09/2019, 11:27 AM

## 2019-06-09 NOTE — TOC Initial Note (Signed)
Transition of Care [facility]) - Initial/Assessment Note    Patient Details  Name: Kathleen Roy MRN: [id number] Date of Birth: 1961-09-26  Transition of Care [facility]) CM/SW Contact:    Alberteen Sam, [place] Phone Number: [phone] 06/09/2019, 12:22 PM  Clinical Narrative:                  CSW consulted with patient's friend listed [name] to inform of [facility] being able to accept patient back when she is medically stable. [name] reports that is patient's plan, to return to [facility] where she has been living. [name] requests to be updated on when patient discharges back to SNF. No further questions or concerns at this time.   Expected Discharge Plan: [place] Barriers to Discharge: Continued Medical Work up   Patient Goals and CMS Choice Patient states their goals for this hospitalization and ongoing recovery are:: to go back to [place].gov Compare Post Acute Care list provided to:: Patient Represenative (must comment)(friend [name]) Choice offered to / list presented to : (friend [place])  Expected Discharge Plan and Services Expected Discharge Plan: [place] Acute Care Choice: [place] arrangements for the past 2 months: Skilled Nursing Facility([facility]) Expected Discharge Date: 06/11/19                                    Prior Living Arrangements/Services Living arrangements for the past 2 months: Skilled Nursing Facility([facility]) Lives with:: Self Patient language and need for interpreter reviewed:: Yes Do you feel safe going back to the place where you live?: Yes      Need for Family Participation in Patient Care: Yes (Comment) Care giver support system in place?: Yes (comment)   Criminal Activity/Legal Involvement Pertinent to Current Situation/Hospitalization: No - Comment as needed  Activities of Daily Living Home Assistive Devices/Equipment: Wheelchair ADL Screening  (condition at time of admission) Patient's cognitive ability adequate to safely complete daily activities?: Yes Is the patient deaf or have difficulty hearing?: Yes Does the patient have difficulty seeing, even when wearing glasses/contacts?: Yes Does the patient have difficulty concentrating, remembering, or making decisions?: Yes Patient able to express need for assistance with ADLs?: Yes Does the patient have difficulty dressing or bathing?: Yes Independently performs ADLs?: No Communication: Independent Is this a change from baseline?: Pre-admission baseline Dressing (OT): Dependent Is this a change from baseline?: Pre-admission baseline Grooming: Dependent Is this a change from baseline?: Pre-admission baseline Feeding: Dependent Is this a change from baseline?: Pre-admission baseline Bathing: Dependent Is this a change from baseline?: Pre-admission baseline Toileting: Dependent Is this a change from baseline?: Pre-admission baseline In/Out Bed: Dependent Is this a change from baseline?: Pre-admission baseline Walks in Home: Dependent Is this a change from baseline?: Pre-admission baseline Does the patient have difficulty walking or climbing stairs?: Yes Weakness of Legs: Both Weakness of Arms/Hands: Both  Permission Sought/Granted Permission sought to share information with : Case Manager, Customer service manager, Family Supports Permission granted to share information with : Yes, Verbal Permission Granted  Share Information with NAME: [name]  Permission granted to share info w AGENCY: SNFs  Permission granted to share info w Relationship: friend  Permission granted to share info w Contact Information: [phone]  Emotional Assessment Appearance:: Other (Comment Required(unable to assess - remote) Attitude/Demeanor/Rapport: Unable to Assess Affect (typically observed): Unable to Assess Orientation: :  Oriented to Self, Oriented to Place, Oriented to  Time,  Oriented to Situation Alcohol / Substance Use: Not Applicable Psych Involvement: No (comment)  Admission diagnosis:  SOB Patient Active Problem List   Diagnosis Date Noted  . Acute respiratory disease due to COVID-19 virus 05/19/2019  . Acute on chronic diastolic congestive heart failure ([place])   . Hypernatremia   . Acute on chronic respiratory failure with hypoxia and hypercapnia ([place]) 05/14/2019  . Diabetes mellitus without complication ([place]) 86/76/7209  . Macrocytic anemia 05/14/2019  . Acute metabolic encephalopathy 47/08/6282  . Candidal dermatitis 09/25/2017  . Requires daily assistance for activities of daily living (ADL) and comfort needs   . Weakness 09/24/2017  . Other fatigue   . Altered mental status 09/20/2017  . 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 Allergic rhinitis 08/15/2017  . Immobility 08/15/2017  . Fibromyalgia 06/16/2017  . Chronic pain of both knees   . Acute respiratory failure with hypercapnia ([place]) 10/15/2016  . Type 2 diabetes mellitus ([place]) 02/14/2016  . Chronic diastolic congestive heart failure ([place])   . Essential hypertension   . Pneumonia 01/15/2016  . OSA (obstructive sleep apnea)   . Obesity hypoventilation syndrome ([place])   . Chronic respiratory failure with hypoxia ([employer])   . Hyperglycemia 11/08/2014  . Memory loss 03/18/2014  . Hypokalemia 08/19/2013  . Venous stasis dermatitis of both lower extremities 12/02/2012  . COPD (chronic obstructive pulmonary disease) ([place]) 10/15/2011  . Peripheral neuropathy ([place]) 10/15/2011  . BACK PAIN, LUMBAR, CHRONIC 01/22/2011  . Morbid obesity with BMI of 70 and over, adult ([place]) 11/16/2007  . Major depressive disorder, recurrent episode ([place]) 02/12/2007   PCP:  [name], MD Pharmacy:   [facility] [phone] - [place], [place] [address] Phone: [phone] Fax: [phone]  [facility] [phone] -  [place], [place] - [place] [address] [place] [facility] [address] Phone: [phone] Fax: [phone]     Social Determinants of Health (SDOH) Interventions    Readmission Risk Interventions No flowsheet data found.

## 2019-06-09 NOTE — Progress Notes (Signed)
Occupational Therapy Treatment Patient Details Name: Kathleen Roy MRN: [id number] DOB: [birth year] Today's Date: 06/09/2019    History of present illness 58 yo female admitted from SNF with increased respiratory distress, H/O morbid obesity, OSA DHF, HTN, DM,Myoclonus, fibromyalgia, depression.Positive for covid-19.   OT comments  Upon arrival, pt awake and supine in bed with RN at bedside. Pt performing self feeding at bed level with set up and supervision demonstrating increased grasp strength to hold standard utensils. Pt participating in BUE exercises with therband (level 1); 10 reps each. Will continue to follow acutely as admitted and continue to recommend dc to SNF.   Follow Up Recommendations  SNF;Supervision/Assistance - 24 hour    Equipment Recommendations  None recommended by OT    Recommendations for Other Services      Precautions / Restrictions Precautions Precautions: Other (comment) Precaution Comments: monitor sats Restrictions Weight Bearing Restrictions: No       Mobility Bed Mobility Overal bed mobility: Needs Assistance             General bed mobility comments: Total A for repositioning in bed  Transfers                      Balance                                           ADL either performed or assessed with clinical judgement   ADL Overall ADL's : Needs assistance/impaired Eating/Feeding: Bed level;Set up;Supervision/ safety Eating/Feeding Details (indicate cue type and reason): Optimizing position in bed and pt able to perform self feeding with set up using normal utensils.                                    General ADL Comments: Pt presenting nearbaseline fucntion. Max-Total A for bathing and toileting. Focused session on self feeding as pt performs self feeding at baseline. Pt benefiting from AE and optimize position in bed. Adapting spoon to increase handle size and weight for decreased grasp  strength and tremors. Positioning pillow at right elbow to decrease tremors.     Vision Baseline Vision/History: Wears glasses Wears Glasses: At all times Patient Visual Report: No change from baseline     Perception     Praxis      Cognition Arousal/Alertness: Awake/alert Behavior During Therapy: WFL for tasks assessed/performed Overall Cognitive Status: Within Functional Limits for tasks assessed                                          Exercises Exercises: General Upper Extremity General Exercises - Upper Extremity Shoulder ABduction: AROM;Both;10 reps;Supine;Theraband Theraband Level (Shoulder Abduction): Level 1 (Yellow) Shoulder ADduction: AROM;Both;10 reps;Supine;Theraband Theraband Level (Shoulder Adduction): Level 1 (Yellow) Elbow Flexion: AROM;Both;10 reps;Supine;Theraband Theraband Level (Elbow Flexion): Level 1 (Yellow) Elbow Extension: AROM;Both;10 reps;Supine;Theraband Theraband Level (Elbow Extension): Level 1 (Yellow)   Shoulder Instructions       General Comments SpO2 94-88% on 9L. During exercises, HR elevating to 130s and RR to 20s.     Pertinent Vitals/ Pain       Faces Pain Scale: Hurts little more Pain Location: Back,  BLE Pain Descriptors / Indicators: Discomfort;Grimacing;Guarding  Home Living Family/patient expects to be discharged to:: Skilled nursing facility                                 Additional Comments: [name]      Prior Functioning/Environment Level of Independence: Needs assistance  Gait / Transfers Assistance Needed: OOB via mechanical lift, has not been OOB for some time per patient. ADL's / Homemaking Assistance Needed: assist for bathing and toileting at bedlevel       Frequency  Min 2X/week        Progress Toward Goals  OT Goals(current goals can now be found in the care plan section)  Progress towards OT goals: Progressing toward goals  Acute Rehab OT Goals Patient Stated  Goal: "Be able to feed myself" OT Goal Formulation: With patient Time For Goal Achievement: 06/22/19 Potential to Achieve Goals: Good ADL Goals Pt Will Perform Eating: with modified independence;with adaptive utensils;bed level;with assist to don/doff brace/orthosis Pt Will Perform Grooming: with modified independence;bed level;with adaptive equipment Pt/caregiver will Perform Home Exercise Program: Increased ROM;Increased strength;Both right and left upper extremity;With Supervision;With written HEP provided Additional ADL Goal #1: Pt will perform rolling in bed with Max A to participate in ADLs at bed level  Plan Discharge plan remains appropriate    Co-evaluation                 AM-PAC OT "6 Clicks" Daily Activity     Outcome Measure   Help from another person eating meals?: A Little Help from another person taking care of personal grooming?: A Little Help from another person toileting, which includes using toliet, bedpan, or urinal?: Total Help from another person bathing (including washing, rinsing, drying)?: Total Help from another person to put on and taking off regular upper body clothing?: A Lot Help from another person to put on and taking off regular lower body clothing?: Total 6 Click Score: 11    End of Session Equipment Utilized During Treatment: Oxygen  OT Visit Diagnosis: Unsteadiness on feet (R26.81);Other abnormalities of gait and mobility (R26.89);Muscle weakness (generalized) (M62.81);Pain Pain - Right/Left: (Bilateral) Pain - part of body: Leg(Back; rectum)   Activity Tolerance Patient tolerated treatment well   Patient Left in bed;with call bell/phone within reach   Nurse Communication Mobility status(Needing to use bedpain)        Time: 1572-6203 OT Time Calculation (min): 28 min  Charges: OT General Charges $OT Visit: 1 Visit OT Treatments $Self Care/Home Management : 8-22 mins $Therapeutic Exercise: 8-22 mins  Jany Buckwalter MSOT,  OTR/L Acute Rehab Pager: [phone] Office: [place] 06/09/2019, 1:29 PM

## 2019-06-10 DIAGNOSIS — I5031 Acute diastolic (congestive) heart failure: Secondary | ICD-10-CM

## 2019-06-10 LAB — GLUCOSE, CAPILLARY
Glucose-Capillary: 159 mg/dL — ABNORMAL HIGH (ref 70–99)
Glucose-Capillary: 281 mg/dL — ABNORMAL HIGH (ref 70–99)
Glucose-Capillary: 302 mg/dL — ABNORMAL HIGH (ref 70–99)
Glucose-Capillary: 242 mg/dL — ABNORMAL HIGH (ref 70–99)

## 2019-06-10 LAB — COMPREHENSIVE METABOLIC PANEL
ALT: 22 U/L (ref 0–44)
ALT: 31 U/L (ref 0–44)
AST: 10 U/L — ABNORMAL LOW (ref 15–41)
AST: 20 U/L (ref 15–41)
Albumin: 2 g/dL — ABNORMAL LOW (ref 3.5–5.0)
Albumin: 2.7 g/dL — ABNORMAL LOW (ref 3.5–5.0)
Alkaline Phosphatase: 72 U/L (ref 38–126)
Alkaline Phosphatase: 101 U/L (ref 38–126)
Anion gap: 24 — ABNORMAL HIGH (ref 5–15)
Anion gap: 11 (ref 5–15)
BUN: 40 mg/dL — ABNORMAL HIGH (ref 6–20)
BUN: 48 mg/dL — ABNORMAL HIGH (ref 6–20)
CO2: 36 mmol/L — ABNORMAL HIGH (ref 22–32)
CO2: 40 mmol/L — ABNORMAL HIGH (ref 22–32)
Calcium: 6.9 mg/dL — ABNORMAL LOW (ref 8.9–10.3)
Calcium: 9.4 mg/dL (ref 8.9–10.3)
Chloride: 94 mmol/L — ABNORMAL LOW (ref 98–111)
Chloride: 90 mmol/L — ABNORMAL LOW (ref 98–111)
Creatinine, Ser: 1.16 mg/dL — ABNORMAL HIGH (ref 0.44–1.00)
Creatinine, Ser: 0.75 mg/dL (ref 0.44–1.00)
GFR calc Af Amer: >60 mL/min (ref >60)
GFR calc Af Amer: >60 mL/min (ref >60)
GFR calc non Af Amer: 52 mL/min — ABNORMAL LOW (ref >60)
GFR calc non Af Amer: >60 mL/min (ref >60)
Glucose, Bld: 269 mg/dL — ABNORMAL HIGH (ref 70–99)
Glucose, Bld: 286 mg/dL — ABNORMAL HIGH (ref 70–99)
Potassium: 2.7 mmol/L — CL (ref 3.5–5.1)
Potassium: 3.3 mmol/L — ABNORMAL LOW (ref 3.5–5.1)
Sodium: 154 mmol/L — ABNORMAL HIGH (ref 135–145)
Sodium: 141 mmol/L (ref 135–145)
Total Bilirubin: 0.3 mg/dL (ref 0.3–1.2)
Total Bilirubin: 0.4 mg/dL (ref 0.3–1.2)
Total Protein: <3 g/dL — ABNORMAL LOW (ref 6.5–8.1)
Total Protein: 6.1 g/dL — ABNORMAL LOW (ref 6.5–8.1)

## 2019-06-10 LAB — CBC WITH DIFFERENTIAL/PLATELET
Abs Immature Granulocytes: 0.06 10*3/uL (ref 0.00–0.07)
Basophils Absolute: 0 10*3/uL (ref 0.0–0.1)
Basophils Relative: 0 %
Eosinophils Absolute: 0 10*3/uL (ref 0.0–0.5)
Eosinophils Relative: 0 %
HCT: 28.3 % — ABNORMAL LOW (ref 36.0–46.0)
Hemoglobin: 8.3 g/dL — ABNORMAL LOW (ref 12.0–15.0)
Immature Granulocytes: 2 %
Lymphocytes Relative: 9 %
Lymphs Abs: 0.4 10*3/uL — ABNORMAL LOW (ref 0.7–4.0)
MCH: 28.5 pg (ref 26.0–34.0)
MCHC: 29.3 g/dL — ABNORMAL LOW (ref 30.0–36.0)
MCV: 97.3 fL (ref 80.0–100.0)
Monocytes Absolute: 0.3 10*3/uL (ref 0.1–1.0)
Monocytes Relative: 8 %
Neutro Abs: 3.2 10*3/uL (ref 1.7–7.7)
Neutrophils Relative %: 81 %
Platelets: 124 10*3/uL — ABNORMAL LOW (ref 150–400)
RBC: 2.91 MIL/uL — ABNORMAL LOW (ref 3.87–5.11)
RDW: 14.9 % (ref 11.5–15.5)
WBC: 3.9 10*3/uL — ABNORMAL LOW (ref 4.0–10.5)
nRBC: 0 % (ref 0.0–0.2)

## 2019-06-10 LAB — MAGNESIUM: Magnesium: 1.7 mg/dL (ref 1.7–2.4)

## 2019-06-10 LAB — D-DIMER, QUANTITATIVE: D-Dimer, Quant: 0.4 ug/mL-FEU (ref 0.00–0.50)

## 2019-06-10 LAB — FERRITIN: Ferritin: 234 ng/mL (ref 11–307)

## 2019-06-10 MED ORDER — OXYCODONE HCL 5 MG PO TABS
5.0000 mg | ORAL_TABLET | Freq: Four times a day (QID) | ORAL | Status: DC | PRN
  Administered 2019-06-10 – 2019-06-16 (×7): 5 mg via ORAL
  Filled 2019-06-10 (×9): qty 1

## 2019-06-10 MED ORDER — FUROSEMIDE 10 MG/ML IJ SOLN
80.0000 mg | Freq: Every day | INTRAMUSCULAR | Status: DC
  Administered 2019-06-11 – 2019-06-16 (×6): 80 mg via INTRAVENOUS
  Filled 2019-06-10 (×6): qty 8

## 2019-06-10 MED ORDER — POTASSIUM CHLORIDE CRYS ER 20 MEQ PO TBCR
40.0000 meq | EXTENDED_RELEASE_TABLET | Freq: Once | ORAL | Status: AC
  Administered 2019-06-10: 40 meq via ORAL
  Filled 2019-06-10: qty 2

## 2019-06-10 MED ORDER — METHYLPREDNISOLONE SODIUM SUCC 40 MG IJ SOLR
40.0000 mg | Freq: Every day | INTRAMUSCULAR | Status: DC
  Administered 2019-06-11: 40 mg via INTRAVENOUS
  Filled 2019-06-10: qty 1

## 2019-06-10 NOTE — Progress Notes (Signed)
[place] for Apixaban Indication: atrial fibrillation  Allergies  Allergen Reactions  . Aspirin Hives    Patient Measurements: Height: 5\' 2"  (157.5 cm) Weight: (!) 304 lb 10.8 oz (138.2 kg) IBW/kg (Calculated) : 50.1  Vital Signs: Temp: 97.7 F (36.5 C) (06/25 0756) Temp Source: Oral (06/25 0756) BP: 120/85 (06/25 0756) Pulse Rate: 97 (06/25 0756)  Labs: Recent Labs    06/08/19 0515 06/09/19 0517 06/10/19 0459  HGB 10.0* 10.2* 8.3*  HCT 33.3* 34.7* 28.3*  PLT 85* 122* 124*  CREATININE 0.84 0.79 1.16*    Estimated Creatinine Clearance: 72.1 mL/min (A) (by C-G formula based on SCr of 1.16 mg/dL (H)).   Medical History: Past Medical History:  Diagnosis Date  . Arthritis    "I feel like it's everywhere" (09/24/2017  . Cellulitis and abscess of lower extremity 08/16/2016   right leg  . CHF (congestive heart failure) ([place])   . Chronic lower back pain   . COPD (chronic obstructive pulmonary disease) ([place])   . Depression   . Fibromyalgia   . Headache    "once q couple months now" (09/24/2017)  . Hyperlipidemia   . Hypertension   . HYPERTENSION, BENIGN SYSTEMIC 02/12/2007  . Memory disturbance   . Migraines 1970's - <2000   "they just went away"  . Neuropathy    "bad in my right hand and in both feet" (09/24/2017)  . Normal echocardiogram 05/30/05   suboptimal study  . Obesity, morbid (more than 100 lbs over ideal weight or BMI > 40) ([facility])    obese since childhood  . On home oxygen therapy    "4L; 24/7" (09/24/2017)  . Osteoarthritis   . Peripheral edema   . Pressure ulcer of foot    left  . Sepsis ([place])   . Shortness of breath dyspnea   . Sleep apnea 10/2014   "couldn't tolerate the mask" (09/24/2017)  . Type II diabetes mellitus [facility])     Assessment: 58 y/o F with a h/o COPD and CHF admitted for worsening respiratory failure and found to be positive for SARS-CoV-2. Patient started on apixaban for new-onset  atrial fibrillation. H/H down, Plt low stable. SCr up to 1.16  CHA2DS2-VASc=4  Plan:  - Continue apixaban 5 mg bid.  - Monitor renal function, CBC, signs of bleeding   Albertina Parr, PharmD., BCPS Clinical Pharmacist Clinical phone for 06/10/19 until 5pm: [phone]

## 2019-06-10 NOTE — Plan of Care (Signed)
CRITICAL VALUE ALERT  Critical Value:  Potassium 2.7  Date & Time Notied:  06/10/2019  Provider Notified: [place] Pink  Orders Received/Actions taken:See order update

## 2019-06-10 NOTE — Progress Notes (Signed)
PROGRESS NOTE  Kathleen Roy GEX:[id number] DOB: 1961-08-16 DOA: 06/11/2019  PCP: [name], MD  Brief History/Interval Summary: 58 y.o.femalewith medical history significant forCOPD, chronic respiratory failure, type 2 diabetes mellitus, hypertension, obstructive sleep apnea, and chronic diastolic CHF,  presenting to the emergency department from a nursing facility for evaluation of worsening lethargy and increased supplemental oxygen requirement., Was COVID-19 positive, as well work-up significant for aspiration pneumonia, worsening respiratory failure, she developed A. fib with RVR 06/07/2019.   Reason for Visit: Acute respiratory failure with hypoxia  Consultants: None  Procedures: PICC line in right upper extremity  Antibiotics: Currently on Unasyn  Subjective/Interval History: Patient states that she is feeling slightly better.  Still somewhat short of breath.  Denies any cough.  No chest pain.  No nausea vomiting.  Does complain of pain in her right leg which has been ongoing for a long time.  Requesting reinstituting her pain medications.     Assessment/Plan:  Acute Hypoxic Resp. Failure due to Acute Covid 19 Viral Illness/aspiration pneumonia Patient had elevated inflammatory markers.  She had high oxygen requirements.  Patient was hospitalized.  She was started on Remdesivir and IV steroids.  She has completed her 5-day course of Remdesivir.  She will complete course of Remdesivir today.  She was also noted to have elevated procalcitonin and there was concern for aspiration pneumonia.  As a result she was not given Actemra.  She was actually placed on antibacterials.  She is on Unasyn.  Her CRP had improved.  D-dimer 0.40.  Ferritin 234.  Continue to wean down oxygen.  COVID-19 Labs  Recent Labs    06/08/19 0515 06/09/19 0517 06/10/19 0459  DDIMER 0.81* 0.63* 0.40  FERRITIN 771* 524* 234  CRP 6.0* 3.2*  --     Lab Results  Component Value Date   SARSCOV2NAA  POSITIVE (A) 05/20/2019   [place] NEGATIVE 05/22/2019   [place] NEGATIVE 05/15/2019   [place] NEGATIVE 05/14/2019    Sepsis, present on admission Thought to be due to aspiration pneumonia and COVID-19 infection.  Patient had a dense right lung base opacity.  Patient was empirically on ceftriaxone azithromycin and vancomycin.  She was switched over to Unasyn.  Part of the problem appears to be that the patient tends to be more supine when she is eating and drinking.  She was told to be as upright as possible.  She says this is difficult due to her back issues.  Acute on chronic hypoxic respiratory failure/hypercapnic respiratory failure At baseline she is on 4 L of oxygen by nasal cannula.  She was requiring high amounts of oxygen during earlier part of this hospitalization.  Has been on 8 L for the last 2 to 3 days.  We will decrease it down to 7 L today and see how she does.    Atrial fibrillation with RVR Patient went into atrial fibrillation with RVR on 6/22.  Initially on Cardizem infusion.  Now on amiodarone infusion.  She is also on Eliquis.  She does not have a previously known history of atrial fibrillation.  Continue current treatment for now.  Drop in hemoglobin noted.  No overt bleeding appreciated.   History of COPD with acute exacerbation Appears to have improved.  Continue to wean down steroids.  Can try and wean down oxygen.  Continue inhalers.    Acute on chronic diastolic CHF Thought to have significant volume overload even though BNP was normal.  Patient takes torsemide 40 mg twice daily at  home.  She was switched over to intravenous Lasix 80 mg every 8 hours.  Has diuresed well.  Weight is significantly lower than what it was at the time of admission.  We decrease the dose of her furosemide yesterday.  Lab results from this morning could be erroneous.  A repeat test has been ordered.  She was also given acetazolamide due to contraction alkalosis.  Will consider  cutting back further on the dose of furosemide.  Hypokalemia Repeat labs are pending  Essential hypertension Blood pressures were soft.  Metoprolol was discontinued.. Blood pressure is reasonably well controlled.  Continue to monitor.  Acute metabolic encephalopathy Most likely due to acute illness.  Seems to be back to her baseline now.  Morbid obesity BMI 58.53  Diabetes mellitus type 2 Currently on long-acting insulin.  Monitor CBGs.  HbA1c was 5.5 in May.  Elevated glucose levels most likely due to steroids.  Leukopenia and mild thrombocytopenia Continue to monitor closely.  Normocytic anemia No evidence of overt bleeding.  DVT Prophylaxis: On apixaban PUD Prophylaxis: Pepcid Code Status: DNR Family Communication: Discussed with the patient Disposition Plan: She is here from a skilled nursing facility.  Hopefully discharge in 2 to 3 days.   Medications:  Scheduled:  apixaban  5 mg Oral BID   ARIPiprazole  2 mg Oral QODAY   feeding supplement (PRO-STAT SUGAR FREE 64)  30 mL Oral BID   fluticasone furoate-vilanterol  1 puff Inhalation Daily   furosemide  80 mg Intravenous BID   insulin aspart  0-20 Units Subcutaneous TID WC   insulin aspart  0-5 Units Subcutaneous QHS   insulin aspart  14 Units Subcutaneous TID WC   insulin detemir  40 Units Subcutaneous BID   lamoTRIgine  25 mg Oral Daily   methylPREDNISolone (SOLU-MEDROL) injection  40 mg Intravenous Q12H   metoprolol succinate  12.5 mg Oral Daily   polyethylene glycol  17 g Oral QODAY   potassium chloride  40 mEq Oral Once   pravastatin  40 mg Oral q1800   pregabalin  100 mg Oral BID   senna  2 tablet Oral QHS   sodium chloride flush  10-40 mL Intracatheter Q12H   sodium chloride flush  3 mL Intravenous Q12H   trazodone  300 mg Oral QHS   umeclidinium bromide  1 puff Inhalation Daily   venlafaxine XR  300 mg Oral Daily   venlafaxine XR  37.5 mg Oral Daily   Continuous:  sodium  chloride 10 mL/hr at 06/10/19 0044   amiodarone 30 mg/hr (06/10/19 0424)   ampicillin-sulbactam (UNASYN) IV 3 g (06/10/19 1057)   HEN:IDPOEU chloride, acetaminophen, albuterol, sodium chloride flush   Objective:  Vital Signs  Vitals:   06/10/19 0443 06/10/19 0517 06/10/19 0746 06/10/19 0756  BP:  (!) 132/97  120/85  Pulse:  95 95 97  Resp:  16 (!) 21 17  Temp:  98 F (36.7 C)  97.7 F (36.5 C)  TempSrc:    Oral  SpO2:  95% 92% 93%  Weight: (!) 138.2 kg     Height:        Intake/Output Summary (Last 24 hours) at 06/10/2019 1126 Last data filed at 06/10/2019 1044 Gross per 24 hour  Intake 1336.99 ml  Output 5475 ml  Net -4138.01 ml   Filed Weights   06/09/19 0738 06/09/19 1208 06/10/19 0443  Weight: (!) 137.9 kg (!) 137.9 kg (!) 138.2 kg   General appearance: Awake alert.  In no distress.  Morbidly obese Resp: Mildly tachypneic at rest.  Difficult exam due to body habitus but no definite wheezing rales or rhonchi.   Cardio: S1-S2 is irregularly irregular.  No S3-S4.  No rubs or bruit.   GI: Abdomen is soft.  Nontender nondistended.  Bowel sounds are present normal.  No masses organomegaly Extremities: No edema.  Full range of motion of lower extremities.    Lab Results:  Data Reviewed: I have personally reviewed following labs and imaging studies  CBC: Recent Labs  Lab 06/06/19 0508 06/07/19 0645 06/08/19 0515 06/09/19 0517 06/10/19 0459  WBC 4.3 4.5 2.8* 3.7* 3.9*  NEUTROABS 3.8 3.8 2.2 3.0 3.2  HGB 9.2* 9.7* 10.0* 10.2* 8.3*  HCT 30.7* 31.3* 33.3* 34.7* 28.3*  MCV 98.1 96.0 97.4 98.0 97.3  PLT 84* 90* 85* 122* 124*    Basic Metabolic Panel: Recent Labs  Lab 06/06/19 0508 06/07/19 0645 06/07/19 1850 06/08/19 0515 06/09/19 0517 06/10/19 0459  NA 141 141 140 142 143 154*  K 3.6 2.8* 2.8* 3.8 3.6 2.7*  CL 90* 84* 82* 85* 86* 94*  CO2 40* 40* 42* 43* 44* 36*  GLUCOSE 231* 198* 372* 346* 247* 269*  BUN 31* 34* 42* 43* 43* 40*  CREATININE 0.78  0.77 0.86 0.84 0.79 1.16*  CALCIUM 8.2* 8.2* 8.0* 8.6* 9.2 6.9*  MG 1.8  --  1.7 1.9 2.2 1.7    GFR: Estimated Creatinine Clearance: 72.1 mL/min (A) (by C-G formula based on SCr of 1.16 mg/dL (H)).  Liver Function Tests: Recent Labs  Lab 06/06/19 0508 06/07/19 0645 06/08/19 0515 06/09/19 0517 06/10/19 0459  AST 20 21 20 17  10*  ALT 34 34 32 32 22  ALKPHOS 122 102 100 100 72  BILITOT 0.6 0.3 0.3 0.3 0.3  PROT 6.2* 6.1* 6.0* 5.9* <3.0*  ALBUMIN 2.8* 2.6* 2.5* 2.6* 2.0*     Cardiac Enzymes: Recent Labs  Lab 06/05/19 1410  CKTOTAL 143    Anemia Panel: Recent Labs    06/09/19 0517 06/10/19 0459  FERRITIN 524* 234    Recent Results (from the past 240 hour(s))  Blood Culture (routine x 2)     Status: Abnormal   Collection Time: 05/27/2019  5:00 PM   Specimen: BLOOD  Result Value Ref Range Status   Specimen Description   Final    BLOOD LEFT ARM Performed at [facility], [place] [address], [place], [place] [address]    Special Requests   Final    BOTTLES DRAWN AEROBIC AND ANAEROBIC Blood Culture adequate volume Performed at [place] [address] [place], [place] [address]    Culture  Setup Time   Final    GRAM POSITIVE COCCI AEROBIC BOTTLE ONLY CRITICAL RESULT CALLED TO, READ BACK BY AND VERIFIED WITH: N.BATCHEOBER,PHARMD 0120 06/06/2019 M.CAMPBELL    Culture (A)  Final    STAPHYLOCOCCUS SPECIES (COAGULASE NEGATIVE) THE SIGNIFICANCE OF ISOLATING THIS ORGANISM FROM A SINGLE SET OF BLOOD CULTURES WHEN MULTIPLE SETS ARE DRAWN IS UNCERTAIN. PLEASE NOTIFY THE MICROBIOLOGY DEPARTMENT WITHIN ONE WEEK IF SPECIATION AND SENSITIVITIES ARE REQUIRED. Performed at [facility], [place] [address] [place], [place] [address]    Report Status 06/08/2019 FINAL  Final  Blood Culture ID Panel (Reflexed)     Status: Abnormal   Collection Time: 06/08/2019  5:00 PM  Result Value Ref Range Status   Enterococcus species NOT DETECTED NOT  DETECTED Final   Listeria monocytogenes NOT DETECTED NOT DETECTED Final  Staphylococcus species DETECTED (A) NOT DETECTED Final    Comment: Methicillin (oxacillin) resistant coagulase negative staphylococcus. Possible blood culture contaminant (unless isolated from more than one blood culture draw or clinical case suggests pathogenicity). No antibiotic treatment is indicated for blood  culture contaminants. CRITICAL RESULT CALLED TO, READ BACK BY AND VERIFIED WITH: N.BATCHEOBER,PHARMD 0120 06/06/2019 M.CAMPBELL    Staphylococcus aureus (BCID) NOT DETECTED NOT DETECTED Final   Methicillin resistance DETECTED (A) NOT DETECTED Final    Comment: CRITICAL RESULT CALLED TO, READ BACK BY AND VERIFIED WITH: N.BATCHEOBER,PHARMD 0120 06/06/2019 M.CAMPBELL    Streptococcus species NOT DETECTED NOT DETECTED Final   Streptococcus agalactiae NOT DETECTED NOT DETECTED Final   Streptococcus pneumoniae NOT DETECTED NOT DETECTED Final   Streptococcus pyogenes NOT DETECTED NOT DETECTED Final   Acinetobacter baumannii NOT DETECTED NOT DETECTED Final   Enterobacteriaceae species NOT DETECTED NOT DETECTED Final   Enterobacter cloacae complex NOT DETECTED NOT DETECTED Final   Escherichia coli NOT DETECTED NOT DETECTED Final   Klebsiella oxytoca NOT DETECTED NOT DETECTED Final   Klebsiella pneumoniae NOT DETECTED NOT DETECTED Final   Proteus species NOT DETECTED NOT DETECTED Final   Serratia marcescens NOT DETECTED NOT DETECTED Final   Haemophilus influenzae NOT DETECTED NOT DETECTED Final   Neisseria meningitidis NOT DETECTED NOT DETECTED Final   Pseudomonas aeruginosa NOT DETECTED NOT DETECTED Final   Candida albicans NOT DETECTED NOT DETECTED Final   Candida glabrata NOT DETECTED NOT DETECTED Final   Candida krusei NOT DETECTED NOT DETECTED Final   Candida parapsilosis NOT DETECTED NOT DETECTED Final   Candida tropicalis NOT DETECTED NOT DETECTED Final    Comment: Performed at [facility],  [place] [address], [place], [place] [address]  SARS Coronavirus 2 (CEPHEID- Performed in [place] hospital lab), Hosp Order     Status: Abnormal   Collection Time: 05/30/2019  7:51 PM   Specimen: Nasopharyngeal Swab  Result Value Ref Range Status   SARS Coronavirus 2 POSITIVE (A) NEGATIVE Final    Comment: RESULT CALLED TO, READ BACK BY AND VERIFIED WITH: RHONEY A @2246  ON 05/29/2019 JACKSON,K (NOTE) If result is NEGATIVE SARS-CoV-2 target nucleic acids are NOT DETECTED. The SARS-CoV-2 RNA is generally detectable in upper and lower  respiratory specimens during the acute phase of infection. The lowest  concentration of SARS-CoV-2 viral copies this assay can detect is 250  copies / mL. A negative result does not preclude SARS-CoV-2 infection  and should not be used as the sole basis for treatment or other  patient management decisions.  A negative result may occur with  improper specimen collection / handling, submission of specimen other  than nasopharyngeal swab, presence of viral mutation(s) within the  areas targeted by this assay, and inadequate number of viral copies  (<250 copies / mL). A negative result must be combined with clinical  observations, patient history, and epidemiological information. If result is POSITIVE SARS-CoV-2 target nucleic acids are DETECTED.  The SARS-CoV-2 RNA is generally detectable in upper and lower  respiratory specimens during the acute phase of infection.  Positive  results are indicative of active infection with SARS-CoV-2.  Clinical  correlation with patient history and other diagnostic information is  necessary to determine patient infection status.  Positive results do  not rule out bacterial infection or co-infection with other viruses. If result is PRESUMPTIVE POSTIVE SARS-CoV-2 nucleic acids MAY BE PRESENT.   A presumptive positive result was obtained on the submitted specimen  and confirmed on repeat  testing.  While 2019 novel coronavirus    (SARS-CoV-2) nucleic acids may be present in the submitted sample  additional confirmatory testing may be necessary for epidemiological  and / or clinical management purposes  to differentiate between  SARS-CoV-2 and other Sarbecovirus currently known to infect humans.  If clinically indicated additional testing with an alternate test  methodology [phone])  is advised. The SARS-CoV-2 RNA is generally  detectable in upper and lower respiratory specimens during the acute  phase of infection. The expected result is Negative. Fact Sheet for Patients:  StrictlyIdeas.no Fact Sheet for Healthcare Providers: BankingDealers.co.za This test is not yet approved or cleared by the [place] FDA and has been authorized for detection and/or diagnosis of SARS-CoV-2 by FDA under an Emergency Use Authorization (EUA).  This EUA will remain in effect (meaning this test can be used) for the duration of the COVID-19 declaration under Section 564(b)(1) of the Act, 21 U.S.C. section 360bbb-3(b)(1), unless the authorization is terminated or revoked sooner. Performed at [facility], [place] [address] [place], [place] [address]   Culture, sputum-assessment     Status: None   Collection Time: 06/05/19 12:30 PM   Specimen: Sputum  Result Value Ref Range Status   Specimen Description SPUTUM  Final   Special Requests NONE  Final   Sputum evaluation   Final    THIS SPECIMEN IS ACCEPTABLE FOR SPUTUM CULTURE Performed at [facility], [place] [address] [place], [place] [address]    Report Status 06/05/2019 FINAL  Final  Culture, respiratory     Status: None   Collection Time: 06/05/19 12:30 PM   Specimen: SPU  Result Value Ref Range Status   Specimen Description   Final    SPUTUM Performed at [place] [address], [place], [place] [address]    Special Requests   Final    NONE Reflexed from  S2110 Performed at [facility], [place] [address], [place], [place] [address]    Gram Stain   Final    FEW WBC PRESENT,BOTH PMN AND MONONUCLEAR FEW YEAST RARE GRAM POSITIVE RODS    Culture   Final    Consistent with normal respiratory flora. Performed at [facility], [place] [address], [place], [place] [address]    Report Status 06/08/2019 FINAL  Final      Radiology Studies: Dg Chest Port 1 View  Result Date: 06/09/2019 CLINICAL DATA:  Pneumonia due to COVID-19 virus. EXAM: PORTABLE CHEST 1 VIEW COMPARISON:  05/24/2019 FINDINGS: A right arm PICC line has been placed and catheter tip is near the junction of the right innominate vein and SVC. Heart size remains enlarged. Persistent volume loss in the right hemithorax. Persistent patchy airspace densities in the central right lung which have minimally changed. Few densities at the left lung base are stable. Negative for pneumothorax. IMPRESSION: Stable chest radiograph findings with airspace densities and consolidation in the central aspect of the right lung. Minimal change from the comparison examination. PICC line tip near the junction of the right innominate vein and SVC. Electronically Signed   By: Markus Daft M.D.   On: 06/09/2019 14:13       LOS: 6 days   [facility] Pager on [email or web address]  06/10/2019, 11:26 AM

## 2019-06-10 NOTE — Care Management Important Message (Signed)
Important Message  Patient Details  Name: Kathleen Roy MRN: [id number] Date of Birth: 17-Feb-1961   Medicare Important Message Given:  Yes     Stevan Eberwein [address] 06/10/2019, 3:53 PM

## 2019-06-11 LAB — CBC
HCT: 36.8 % (ref 36.0–46.0)
Hemoglobin: 10.8 g/dL — ABNORMAL LOW (ref 12.0–15.0)
MCH: 29.1 pg (ref 26.0–34.0)
MCHC: 29.3 g/dL — ABNORMAL LOW (ref 30.0–36.0)
MCV: 99.2 fL (ref 80.0–100.0)
Platelets: 205 10*3/uL (ref 150–400)
RBC: 3.71 MIL/uL — ABNORMAL LOW (ref 3.87–5.11)
RDW: 15.5 % (ref 11.5–15.5)
WBC: 10.8 10*3/uL — ABNORMAL HIGH (ref 4.0–10.5)
nRBC: 0 % (ref 0.0–0.2)

## 2019-06-11 LAB — MAGNESIUM: Magnesium: 2.3 mg/dL (ref 1.7–2.4)

## 2019-06-11 LAB — BASIC METABOLIC PANEL
Anion gap: 13 (ref 5–15)
BUN: 49 mg/dL — ABNORMAL HIGH (ref 6–20)
CO2: 43 mmol/L — ABNORMAL HIGH (ref 22–32)
Calcium: 9.3 mg/dL (ref 8.9–10.3)
Chloride: 85 mmol/L — ABNORMAL LOW (ref 98–111)
Creatinine, Ser: 0.83 mg/dL (ref 0.44–1.00)
GFR calc Af Amer: >60 mL/min (ref >60)
GFR calc non Af Amer: >60 mL/min (ref >60)
Glucose, Bld: 221 mg/dL — ABNORMAL HIGH (ref 70–99)
Potassium: 3.3 mmol/L — ABNORMAL LOW (ref 3.5–5.1)
Sodium: 141 mmol/L (ref 135–145)

## 2019-06-11 LAB — GLUCOSE, CAPILLARY
Glucose-Capillary: 51 mg/dL — ABNORMAL LOW (ref 70–99)
Glucose-Capillary: 365 mg/dL — ABNORMAL HIGH (ref 70–99)
Glucose-Capillary: 255 mg/dL — ABNORMAL HIGH (ref 70–99)

## 2019-06-11 MED ORDER — AMOXICILLIN-POT CLAVULANATE 875-125 MG PO TABS
1.0000 | ORAL_TABLET | Freq: Two times a day (BID) | ORAL | Status: DC
  Administered 2019-06-11 – 2019-06-16 (×11): 1 via ORAL
  Filled 2019-06-11 (×13): qty 1

## 2019-06-11 MED ORDER — INSULIN ASPART 100 UNIT/ML ~~LOC~~ SOLN
6.0000 [IU] | Freq: Three times a day (TID) | SUBCUTANEOUS | Status: DC
  Administered 2019-06-11 (×2): 6 [IU] via SUBCUTANEOUS

## 2019-06-11 MED ORDER — AMIODARONE HCL 200 MG PO TABS
200.0000 mg | ORAL_TABLET | Freq: Two times a day (BID) | ORAL | Status: DC
  Administered 2019-06-11 – 2019-06-16 (×11): 200 mg via ORAL
  Filled 2019-06-11 (×13): qty 1

## 2019-06-11 MED ORDER — PREDNISONE 20 MG PO TABS
20.0000 mg | ORAL_TABLET | Freq: Every day | ORAL | Status: AC
  Administered 2019-06-12 – 2019-06-14 (×3): 20 mg via ORAL
  Filled 2019-06-11 (×3): qty 1

## 2019-06-11 MED ORDER — POTASSIUM CHLORIDE CRYS ER 20 MEQ PO TBCR
40.0000 meq | EXTENDED_RELEASE_TABLET | Freq: Once | ORAL | Status: AC
  Administered 2019-06-11: 40 meq via ORAL
  Filled 2019-06-11: qty 2

## 2019-06-11 MED ORDER — INSULIN DETEMIR 100 UNIT/ML ~~LOC~~ SOLN
20.0000 [IU] | Freq: Two times a day (BID) | SUBCUTANEOUS | Status: DC
  Administered 2019-06-11 – 2019-06-12 (×2): 20 [IU] via SUBCUTANEOUS
  Filled 2019-06-11 (×2): qty 0.2

## 2019-06-11 MED ORDER — ALTEPLASE 2 MG IJ SOLR
2.0000 mg | Freq: Once | INTRAMUSCULAR | Status: AC
  Administered 2019-06-11: 2 mg
  Filled 2019-06-11: qty 2

## 2019-06-11 NOTE — Progress Notes (Signed)
ICU charge nurse requested to declot PICC line via TPA. TPA instilled and withdrawn per protocol. No complications. Both lumens are fully functional and draw back blood.

## 2019-06-11 NOTE — Progress Notes (Signed)
PROGRESS NOTE  Kathleen Roy YYT:[id number] DOB: [birth year] DOA: 05/29/2019  PCP: [name], MD  Brief History/Interval Summary: 58 y.o.femalewith medical history significant forCOPD, chronic respiratory failure, type 2 diabetes mellitus, hypertension, obstructive sleep apnea, and chronic diastolic CHF,  presenting to the emergency department from a nursing facility for evaluation of worsening lethargy and increased supplemental oxygen requirement., Was COVID-19 positive, as well work-up significant for aspiration pneumonia, worsening respiratory failure, she developed A. fib with RVR 06/07/2019.   Reason for Visit: Acute respiratory failure with hypoxia  Consultants: None  Procedures: PICC line in right upper extremity  Antibiotics: Currently on Unasyn  Subjective/Interval History: Patient states that she is feeling better.  Less short of breath.  Pain in the right leg persists which again is chronic.  Denies any nausea vomiting.  She converted to sinus rhythm sometime during the course of the night per nursing staff.     Assessment/Plan:  Acute Hypoxic Resp. Failure due to Acute Covid 19 Viral Illness/aspiration pneumonia Patient had elevated inflammatory markers.  She had high oxygen requirements.  She was started on Remdesivir and IV steroids.  She has completed her 5-day course of Remdesivir.  She was also noted to have elevated procalcitonin and there was concern for aspiration pneumonia.  As a result she was not given Actemra.  She was actually placed on antibacterials.  She is on Unasyn.  Her CRP had improved.  D-dimer 0.40.  Ferritin 234.  She is now down to 4-1/2 L of oxygen by nasal cannula.  We will change her to oral antibiotics.  COVID-19 Labs  Recent Labs    06/09/19 0517 06/10/19 0459  DDIMER 0.63* 0.40  FERRITIN 524* 234  CRP 3.2*  --     Lab Results  Component Value Date   SARSCOV2NAA POSITIVE (A) 05/25/2019   [place] NEGATIVE 05/22/2019   [place] NEGATIVE 05/15/2019   [place] NEGATIVE 05/14/2019    Sepsis, present on admission Thought to be due to aspiration pneumonia and COVID-19 infection.  Patient had a dense right lung base opacity.  Patient was empirically on ceftriaxone azithromycin and vancomycin.  She was switched over to Unasyn.  Part of the problem appears to be that the patient tends to be more supine when she is eating and drinking.  She was told to be as upright as possible.  She says this is difficult due to her back issues.  Patient appears to be improving.  Change her to oral antibiotics.  Acute on chronic hypoxic respiratory failure/hypercapnic respiratory failure At baseline she is on 4 L of oxygen by nasal cannula.  She was requiring high amounts of oxygen during earlier part of this hospitalization.  She has been slowly weaned down.  Now she is on 4-1/2 L of oxygen by nasal cannula.  Continue to monitor.     Atrial fibrillation with RVR Patient went into atrial fibrillation with RVR on 6/22.  Initially on Cardizem infusion.  Now on amiodarone infusion.  She is also on Eliquis.  She does not have a previously known history of atrial fibrillation.  She appears to have converted to sinus rhythm.  Change her to oral amiodarone.  Yesterday a drop in hemoglobin was noted.  Repeat levels pending from this morning.  No overt bleeding noted currently.  Nursing staff raise concern for pink discoloration to bowel movement.  Not noted this morning.  Chads 2 vascular score is 4.  History of COPD with acute exacerbation Appears to have improved.  Continue  to wean down steroids.  Continue to wean down oxygen.  Continue inhalers.   Acute on chronic diastolic CHF Thought to have significant volume overload even though BNP was normal.  Patient takes torsemide 40 mg twice daily at home.  She was switched over to intravenous Lasix 80 mg every 8 hours.  She diuresed well.  Weight has decreased significantly.  Lasix was reduced  to once a day.  She was also given acetazolamide due to contraction alkalosis.  Follow-up on labs today.    Hypokalemia Potassium was repleted yesterday.  Repeat labs pending today.  Essential hypertension Blood pressure is stable.  Metoprolol was discontinued during earlier part of this hospitalization due to low blood pressures.    Acute metabolic encephalopathy Most likely due to acute illness.  Seems to be back to her baseline now.  Morbid obesity BMI 58.53  Diabetes mellitus type 2 Currently on long-acting insulin.  Continue to monitor CBGs.  Hypoglycemic episode noted this morning.  As steroid is tapered down we may need to cut back on her insulin.  HbA1c was 5.5 in May.    Leukopenia and mild thrombocytopenia Continue to monitor closely.  Normocytic anemia No evidence of overt bleeding.  Drop in hemoglobin noted without any overt bleeding.  Repeat levels pending from this morning.  DVT Prophylaxis: On apixaban PUD Prophylaxis: Pepcid Code Status: DNR Family Communication: Discussed with the patient Disposition Plan: She is here from a skilled nursing facility.  Hopefully discharge in 2 to 3 days.   Medications:  Scheduled: . alteplase  2 mg Intracatheter Once  . apixaban  5 mg Oral BID  . ARIPiprazole  2 mg Oral QODAY  . feeding supplement (PRO-STAT SUGAR FREE 64)  30 mL Oral BID  . fluticasone furoate-vilanterol  1 puff Inhalation Daily  . furosemide  80 mg Intravenous Daily  . insulin aspart  0-20 Units Subcutaneous TID WC  . insulin aspart  0-5 Units Subcutaneous QHS  . insulin aspart  14 Units Subcutaneous TID WC  . insulin detemir  40 Units Subcutaneous BID  . lamoTRIgine  25 mg Oral Daily  . methylPREDNISolone (SOLU-MEDROL) injection  40 mg Intravenous Daily  . metoprolol succinate  12.5 mg Oral Daily  . polyethylene glycol  17 g Oral QODAY  . pravastatin  40 mg Oral q1800  . pregabalin  100 mg Oral BID  . senna  2 tablet Oral QHS  . sodium chloride flush   10-40 mL Intracatheter Q12H  . sodium chloride flush  3 mL Intravenous Q12H  . trazodone  300 mg Oral QHS  . umeclidinium bromide  1 puff Inhalation Daily  . venlafaxine XR  300 mg Oral Daily  . venlafaxine XR  37.5 mg Oral Daily   Continuous: . sodium chloride 10 mL/hr at 06/10/19 0044  . amiodarone 30 mg/hr (06/11/19 0516)  . ampicillin-sulbactam (UNASYN) IV 3 g (06/11/19 0757)   BTD:VVOHYW chloride, acetaminophen, albuterol, oxyCODONE, sodium chloride flush   Objective:  Vital Signs  Vitals:   06/11/19 0100 06/11/19 0200 06/11/19 0500 06/11/19 0732  BP:    119/87  Pulse: 78 78  80  Resp: 15 16  (!) 24  Temp:   97.6 F (36.4 C) 97.7 F (36.5 C)  TempSrc:   Oral Oral  SpO2: 95% 93%  94%  Weight:   (!) 139 kg   Height:        Intake/Output Summary (Last 24 hours) at 06/11/2019 0942 Last data filed at 06/11/2019 0800  Gross per 24 hour  Intake 1835.33 ml  Output 1600 ml  Net 235.33 ml   Filed Weights   06/09/19 1208 06/10/19 0443 06/11/19 0500  Weight: (!) 137.9 kg (!) 138.2 kg (!) 139 kg    General appearance: Awake alert.  In no distress.  Morbidly obese Resp: Diminished air entry at the bases.  Difficult examination due to body habitus.  But no obvious wheezing rales or rhonchi. Cardio: S1-S2 is normal regular this morning.  No S3-S4.  Telemetry shows sinus rhythm. GI: Abdomen is soft.  Obese.  Nontender.   Neurologic: Alert and oriented x3.  No focal neurological deficits.    Lab Results:  Data Reviewed: I have personally reviewed following labs and imaging studies  CBC: Recent Labs  Lab 06/06/19 0508 06/07/19 0645 06/08/19 0515 06/09/19 0517 06/10/19 0459  WBC 4.3 4.5 2.8* 3.7* 3.9*  NEUTROABS 3.8 3.8 2.2 3.0 3.2  HGB 9.2* 9.7* 10.0* 10.2* 8.3*  HCT 30.7* 31.3* 33.3* 34.7* 28.3*  MCV 98.1 96.0 97.4 98.0 97.3  PLT 84* 90* 85* 122* 124*    Basic Metabolic Panel: Recent Labs  Lab 06/07/19 1850 06/08/19 0515 06/09/19 0517 06/10/19 0459  06/10/19 1120 06/11/19 0809  NA 140 142 143 154* 141 QUESTIONABLE RESULTS, RECOMMEND RECOLLECT TO VERIFY  K 2.8* 3.8 3.6 2.7* 3.3* QUESTIONABLE RESULTS, RECOMMEND RECOLLECT TO VERIFY  CL 82* 85* 86* 94* 90* QUESTIONABLE RESULTS, RECOMMEND RECOLLECT TO VERIFY  CO2 42* 43* 44* 36* 40* QUESTIONABLE RESULTS, RECOMMEND RECOLLECT TO VERIFY  GLUCOSE 372* 346* 247* 269* 286* QUESTIONABLE RESULTS, RECOMMEND RECOLLECT TO VERIFY  BUN 42* 43* 43* 40* 48* QUESTIONABLE RESULTS, RECOMMEND RECOLLECT TO VERIFY  CREATININE 0.86 0.84 0.79 1.16* 0.75 QUESTIONABLE RESULTS, RECOMMEND RECOLLECT TO VERIFY  CALCIUM 8.0* 8.6* 9.2 6.9* 9.4 QUESTIONABLE RESULTS, RECOMMEND RECOLLECT TO VERIFY  MG 1.7 1.9 2.2 1.7  --  QUESTIONABLE RESULTS, RECOMMEND RECOLLECT TO VERIFY    GFR: CrCl cannot be calculated (This lab value cannot be used to calculate CrCl because it is not a number: QUESTIONABLE RESULTS, RECOMMEND RECOLLECT TO VERIFY).  Liver Function Tests: Recent Labs  Lab 06/07/19 0645 06/08/19 0515 06/09/19 0517 06/10/19 0459 06/10/19 1120  AST 21 20 17  10* 20  ALT 34 32 32 22 31  ALKPHOS 102 100 100 72 101  BILITOT 0.3 0.3 0.3 0.3 0.4  PROT 6.1* 6.0* 5.9* <3.0* 6.1*  ALBUMIN 2.6* 2.5* 2.6* 2.0* 2.7*     Cardiac Enzymes: Recent Labs  Lab 06/05/19 1410  CKTOTAL 143    Anemia Panel: Recent Labs    06/09/19 0517 06/10/19 0459  FERRITIN 524* 234    Recent Results (from the past 240 hour(s))  Blood Culture (routine x 2)     Status: Abnormal   Collection Time: 06/06/2019  5:00 PM   Specimen: BLOOD  Result Value Ref Range Status   Specimen Description   Final    BLOOD LEFT ARM Performed at [facility], [place] [address], [place], [place] [address]    Special Requests   Final    BOTTLES DRAWN AEROBIC AND ANAEROBIC Blood Culture adequate volume Performed at [facility] [address], [place], [place] [address]    Culture  Setup Time   Final    GRAM POSITIVE  COCCI AEROBIC BOTTLE ONLY CRITICAL RESULT CALLED TO, READ BACK BY AND VERIFIED WITH: N.BATCHEOBER,PHARMD 0120 06/06/2019 M.CAMPBELL    Culture (A)  Final    STAPHYLOCOCCUS SPECIES (COAGULASE NEGATIVE) THE SIGNIFICANCE OF ISOLATING  THIS ORGANISM FROM A SINGLE SET OF BLOOD CULTURES WHEN MULTIPLE SETS ARE DRAWN IS UNCERTAIN. PLEASE NOTIFY THE MICROBIOLOGY DEPARTMENT WITHIN ONE WEEK IF SPECIATION AND SENSITIVITIES ARE REQUIRED. Performed at [facility], [place] [address], [place], [place] [address]    Report Status 06/08/2019 FINAL  Final  Blood Culture ID Panel (Reflexed)     Status: Abnormal   Collection Time: 06/01/2019  5:00 PM  Result Value Ref Range Status   Enterococcus species NOT DETECTED NOT DETECTED Final   Listeria monocytogenes NOT DETECTED NOT DETECTED Final   Staphylococcus species DETECTED (A) NOT DETECTED Final    Comment: Methicillin (oxacillin) resistant coagulase negative staphylococcus. Possible blood culture contaminant (unless isolated from more than one blood culture draw or clinical case suggests pathogenicity). No antibiotic treatment is indicated for blood  culture contaminants. CRITICAL RESULT CALLED TO, READ BACK BY AND VERIFIED WITH: N.BATCHEOBER,PHARMD 0120 06/06/2019 M.CAMPBELL    Staphylococcus aureus (BCID) NOT DETECTED NOT DETECTED Final   Methicillin resistance DETECTED (A) NOT DETECTED Final    Comment: CRITICAL RESULT CALLED TO, READ BACK BY AND VERIFIED WITH: N.BATCHEOBER,PHARMD 0120 06/06/2019 M.CAMPBELL    Streptococcus species NOT DETECTED NOT DETECTED Final   Streptococcus agalactiae NOT DETECTED NOT DETECTED Final   Streptococcus pneumoniae NOT DETECTED NOT DETECTED Final   Streptococcus pyogenes NOT DETECTED NOT DETECTED Final   Acinetobacter baumannii NOT DETECTED NOT DETECTED Final   Enterobacteriaceae species NOT DETECTED NOT DETECTED Final   Enterobacter cloacae complex NOT DETECTED NOT DETECTED Final   Escherichia coli NOT DETECTED NOT  DETECTED Final   Klebsiella oxytoca NOT DETECTED NOT DETECTED Final   Klebsiella pneumoniae NOT DETECTED NOT DETECTED Final   Proteus species NOT DETECTED NOT DETECTED Final   Serratia marcescens NOT DETECTED NOT DETECTED Final   Haemophilus influenzae NOT DETECTED NOT DETECTED Final   Neisseria meningitidis NOT DETECTED NOT DETECTED Final   Pseudomonas aeruginosa NOT DETECTED NOT DETECTED Final   Candida albicans NOT DETECTED NOT DETECTED Final   Candida glabrata NOT DETECTED NOT DETECTED Final   Candida krusei NOT DETECTED NOT DETECTED Final   Candida parapsilosis NOT DETECTED NOT DETECTED Final   Candida tropicalis NOT DETECTED NOT DETECTED Final    Comment: Performed at [facility], [place] [address] [place], [place] [address]  SARS Coronavirus 2 (CEPHEID- Performed in [place] [facility] lab), Hosp Order     Status: Abnormal   Collection Time: 06/11/2019  7:51 PM   Specimen: Nasopharyngeal Swab  Result Value Ref Range Status   SARS Coronavirus 2 POSITIVE (A) NEGATIVE Final    Comment: RESULT CALLED TO, READ BACK BY AND VERIFIED WITH: RHONEY A @2246  ON 06/02/2019 JACKSON,K (NOTE) If result is NEGATIVE SARS-CoV-2 target nucleic acids are NOT DETECTED. The SARS-CoV-2 RNA is generally detectable in upper and lower  respiratory specimens during the acute phase of infection. The lowest  concentration of SARS-CoV-2 viral copies this assay can detect is 250  copies / mL. A negative result does not preclude SARS-CoV-2 infection  and should not be used as the sole basis for treatment or other  patient management decisions.  A negative result may occur with  improper specimen collection / handling, submission of specimen other  than nasopharyngeal swab, presence of viral mutation(s) within the  areas targeted by this assay, and inadequate number of viral copies  (<250 copies / mL). A negative result must be combined with clinical  observations, patient history, and epidemiological  information. If result is POSITIVE  SARS-CoV-2 target nucleic acids are DETECTED.  The SARS-CoV-2 RNA is generally detectable in upper and lower  respiratory specimens during the acute phase of infection.  Positive  results are indicative of active infection with SARS-CoV-2.  Clinical  correlation with patient history and other diagnostic information is  necessary to determine patient infection status.  Positive results do  not rule out bacterial infection or co-infection with other viruses. If result is PRESUMPTIVE POSTIVE SARS-CoV-2 nucleic acids MAY BE PRESENT.   A presumptive positive result was obtained on the submitted specimen  and confirmed on repeat testing.  While 2019 novel coronavirus  (SARS-CoV-2) nucleic acids may be present in the submitted sample  additional confirmatory testing may be necessary for epidemiological  and / or clinical management purposes  to differentiate between  SARS-CoV-2 and other Sarbecovirus currently known to infect humans.  If clinically indicated additional testing with an alternate test  methodology [phone])  is advised. The SARS-CoV-2 RNA is generally  detectable in upper and lower respiratory specimens during the acute  phase of infection. The expected result is Negative. Fact Sheet for Patients:  StrictlyIdeas.no Fact Sheet for Healthcare Providers: BankingDealers.co.za This test is not yet approved or cleared by the [place] FDA and has been authorized for detection and/or diagnosis of SARS-CoV-2 by FDA under an Emergency Use Authorization (EUA).  This EUA will remain in effect (meaning this test can be used) for the duration of the COVID-19 declaration under Section 564(b)(1) of the Act, 21 U.S.C. section 360bbb-3(b)(1), unless the authorization is terminated or revoked sooner. Performed at [facility], [place] [address] [place], [place] [address]   Culture,  sputum-assessment     Status: None   Collection Time: 06/05/19 12:30 PM   Specimen: Sputum  Result Value Ref Range Status   Specimen Description SPUTUM  Final   Special Requests NONE  Final   Sputum evaluation   Final    THIS SPECIMEN IS ACCEPTABLE FOR SPUTUM CULTURE Performed at [facility], [place] [address], [place], [place] [address]    Report Status 06/05/2019 FINAL  Final  Culture, respiratory     Status: None   Collection Time: 06/05/19 12:30 PM   Specimen: SPU  Result Value Ref Range Status   Specimen Description   Final    SPUTUM Performed at [facility] [address] [place], [place] [address]    Special Requests   Final    NONE Reflexed from S2110 Performed at [facility], [place] [address] [place], [place] [address]    Gram Stain   Final    FEW WBC PRESENT,BOTH PMN AND MONONUCLEAR FEW YEAST RARE GRAM POSITIVE RODS    Culture   Final    Consistent with normal respiratory flora. Performed at [facility], [place] [address], [place], [place] [address]    Report Status 06/08/2019 FINAL  Final      Radiology Studies: Dg Chest Port 1 View  Result Date: 06/09/2019 CLINICAL DATA:  Pneumonia due to COVID-19 virus. EXAM: PORTABLE CHEST 1 VIEW COMPARISON:  05/18/2019 FINDINGS: A right arm PICC line has been placed and catheter tip is near the junction of the right innominate vein and SVC. Heart size remains enlarged. Persistent volume loss in the right hemithorax. Persistent patchy airspace densities in the central right lung which have minimally changed. Few densities at the left lung base are stable. Negative for pneumothorax. IMPRESSION: Stable chest radiograph findings with airspace densities and  consolidation in the central aspect of the right lung. Minimal change from the comparison examination. PICC line tip near the junction of the right innominate vein and SVC. Electronically Signed   By: Markus Daft M.D.    On: 06/09/2019 14:13       LOS: 7 days   [facility] Pager on [email or web address]  06/11/2019, 9:42 AM

## 2019-06-12 LAB — CBC
HCT: 33.6 % — ABNORMAL LOW (ref 36.0–46.0)
Hemoglobin: 9.5 g/dL — ABNORMAL LOW (ref 12.0–15.0)
MCH: 28.4 pg (ref 26.0–34.0)
MCHC: 28.3 g/dL — ABNORMAL LOW (ref 30.0–36.0)
MCV: 100.6 fL — ABNORMAL HIGH (ref 80.0–100.0)
Platelets: 217 10*3/uL (ref 150–400)
RBC: 3.34 MIL/uL — ABNORMAL LOW (ref 3.87–5.11)
RDW: 15.6 % — ABNORMAL HIGH (ref 11.5–15.5)
WBC: 7 10*3/uL (ref 4.0–10.5)
nRBC: 0 % (ref 0.0–0.2)

## 2019-06-12 LAB — GLUCOSE, CAPILLARY
Glucose-Capillary: 50 mg/dL — ABNORMAL LOW (ref 70–99)
Glucose-Capillary: 58 mg/dL — ABNORMAL LOW (ref 70–99)
Glucose-Capillary: 94 mg/dL (ref 70–99)
Glucose-Capillary: 239 mg/dL — ABNORMAL HIGH (ref 70–99)
Glucose-Capillary: 280 mg/dL — ABNORMAL HIGH (ref 70–99)
Glucose-Capillary: 245 mg/dL — ABNORMAL HIGH (ref 70–99)

## 2019-06-12 LAB — BASIC METABOLIC PANEL
Anion gap: 8 (ref 5–15)
BUN: 38 mg/dL — ABNORMAL HIGH (ref 6–20)
CO2: 46 mmol/L — ABNORMAL HIGH (ref 22–32)
Calcium: 9.8 mg/dL (ref 8.9–10.3)
Chloride: 90 mmol/L — ABNORMAL LOW (ref 98–111)
Creatinine, Ser: 0.56 mg/dL (ref 0.44–1.00)
GFR calc Af Amer: >60 mL/min (ref >60)
GFR calc non Af Amer: >60 mL/min (ref >60)
Glucose, Bld: 78 mg/dL (ref 70–99)
Potassium: 3.9 mmol/L (ref 3.5–5.1)
Sodium: 144 mmol/L (ref 135–145)

## 2019-06-12 MED ORDER — RISAQUAD PO CAPS
1.0000 | ORAL_CAPSULE | Freq: Every day | ORAL | Status: DC
  Administered 2019-06-12 – 2019-06-17 (×6): 1 via ORAL
  Filled 2019-06-12 (×6): qty 1

## 2019-06-12 MED ORDER — POTASSIUM CHLORIDE CRYS ER 20 MEQ PO TBCR
40.0000 meq | EXTENDED_RELEASE_TABLET | Freq: Once | ORAL | Status: AC
  Administered 2019-06-12: 40 meq via ORAL
  Filled 2019-06-12: qty 2

## 2019-06-12 MED ORDER — LOPERAMIDE HCL 2 MG PO CAPS
4.0000 mg | ORAL_CAPSULE | Freq: Three times a day (TID) | ORAL | Status: DC | PRN
  Administered 2019-06-12 – 2019-06-14 (×2): 4 mg via ORAL
  Filled 2019-06-12 (×2): qty 2

## 2019-06-12 NOTE — Progress Notes (Signed)
PROGRESS NOTE  Kathleen Roy UKG:[id number] DOB: 1961/11/21 DOA: 06/11/2019  PCP: [name], MD  Brief History/Interval Summary: 58 y.o.femalewith medical history significant forCOPD, chronic respiratory failure, type 2 diabetes mellitus, hypertension, obstructive sleep apnea, and chronic diastolic CHF,  presenting to the emergency department from a nursing facility for evaluation of worsening lethargy and increased supplemental oxygen requirement., Was COVID-19 positive, as well work-up significant for aspiration pneumonia, worsening respiratory failure, she developed A. fib with RVR 06/07/2019.   Reason for Visit: Acute respiratory failure with hypoxia  Consultants: None  Procedures: PICC line in right upper extremity  Antibiotics: Unasyn was changed over to Augmentin  Subjective/Interval History: Patient states that overall she is feeling better.  Not as short of breath as previously.  Occasional cough.  No nausea vomiting.  Remains in sinus rhythm.     Assessment/Plan:  Acute Hypoxic Resp. Failure due to Acute Covid 19 Viral Illness/aspiration pneumonia Patient had elevated inflammatory markers.  She had high oxygen requirements.  She was started on Remdesivir and IV steroids.  She has completed her 5-day course of Remdesivir.  She was also noted to have elevated procalcitonin and there was concern for aspiration pneumonia.  As a result she was not given Actemra.  She was actually placed on antibacterials.  She was on Unasyn which was changed over to Augmentin.  Cultures had improved.  At baseline she is on 4 L of oxygen by nasal cannula.  She is close to her baseline now in terms of her oxygen requirements.    Sepsis, present on admission/secondary to aspiration pneumonia Thought to be due to aspiration pneumonia and COVID-19 infection.  Sepsis has resolved.  Patient had a dense right lung base opacity.  Patient was empirically on ceftriaxone azithromycin and vancomycin.  She  was switched over to Unasyn.  Part of the problem appears to be that the patient tends to be more supine when she is eating and drinking.  She was told to be as upright as possible.  She says this is difficult due to her back issues.  Patient has improved.  Changed to Augmentin yesterday.    Acute on chronic hypoxic respiratory failure/hypercapnic respiratory failure At baseline she is on 4 L of oxygen by nasal cannula.  She was requiring high amounts of oxygen during earlier part of this hospitalization.  She has been slowly weaned down.  To 5 L of oxygen saturating in the 90s.     Atrial fibrillation with RVR Patient went into atrial fibrillation with RVR on 6/22.  Initially on Cardizem infusion.  And she was placed on amiodarone infusion.  She was placed on Eliquis.  Chads 2 vascular score is 4.  She converted to sinus rhythm.  She is now on oral amiodarone.  No overt bleeding has been noted although there was some concern for pink-colored urine.  Slight drop in hemoglobin noted.  Continue to monitor for now.    History of COPD with acute exacerbation Appears to have improved.  Continue to wean down steroids.  Continue to wean down oxygen.  Continue inhalers.   Acute on chronic diastolic CHF Thought to have significant volume overload even though BNP was normal.  Patient takes torsemide 40 mg twice daily at home.  She was switched over to intravenous Lasix 80 mg every 8 hours.  She diuresed well.  Weight has decreased significantly.  From 145 kg to 139.5 kg.  Lasix dose was reduced to once a day.  She was also  given acetazolamide for contraction alkalosis.  However she does have chronically elevated bicarbonate levels.  This is compensatory due to elevated PCO2.  Hypokalemia Potassium level is 3.9 today.  She will be given additional dose of potassium today.  Essential hypertension Blood pressure is stable.  Metoprolol was discontinued during earlier part of this hospitalization due to low blood  pressures.    Acute metabolic encephalopathy Most likely due to acute illness.  Seems to be back to her baseline now.  Morbid obesity BMI 58.53  Diabetes mellitus type 2 with episodes of hypoglycemia this morning She was on high doses of long-acting insulin.  She has had low blood glucose levels.  This occurred when her dose of steroid was tapered down.  CBG noted to be 50 this morning.  Holding her long-acting insulin.  HbA1c was 5.5 in May.  Continue to monitor closely and adjust insulin dose as necessary.     Leukopenia and mild thrombocytopenia Continue to monitor closely.  Normocytic anemia No clear evidence for overt bleeding although there was some concern for pink-colored urine.  Hemoglobin slightly low today but stable compared to 2 days ago.  Continue to monitor for now.  Patient is on Eliquis.  DVT Prophylaxis: On apixaban PUD Prophylaxis: Pepcid Code Status: DNR Family Communication: Discussed with the patient Disposition Plan: She is here from a skilled nursing facility.  Hopefully discharge in 2 to 3 days.   Medications:  Scheduled: . amiodarone  200 mg Oral BID  . amoxicillin-clavulanate  1 tablet Oral Q12H  . apixaban  5 mg Oral BID  . ARIPiprazole  2 mg Oral QODAY  . feeding supplement (PRO-STAT SUGAR FREE 64)  30 mL Oral BID  . fluticasone furoate-vilanterol  1 puff Inhalation Daily  . furosemide  80 mg Intravenous Daily  . insulin aspart  0-20 Units Subcutaneous TID WC  . insulin aspart  0-5 Units Subcutaneous QHS  . lamoTRIgine  25 mg Oral Daily  . metoprolol succinate  12.5 mg Oral Daily  . polyethylene glycol  17 g Oral QODAY  . pravastatin  40 mg Oral q1800  . predniSONE  20 mg Oral Q breakfast  . pregabalin  100 mg Oral BID  . senna  2 tablet Oral QHS  . sodium chloride flush  10-40 mL Intracatheter Q12H  . sodium chloride flush  3 mL Intravenous Q12H  . trazodone  300 mg Oral QHS  . umeclidinium bromide  1 puff Inhalation Daily  . venlafaxine XR   300 mg Oral Daily  . venlafaxine XR  37.5 mg Oral Daily   Continuous: . sodium chloride 10 mL/hr at 06/10/19 0044   MBT:DHRCBU chloride, acetaminophen, albuterol, oxyCODONE, sodium chloride flush   Objective:  Vital Signs  Vitals:   06/11/19 2154 06/12/19 0500 06/12/19 0642 06/12/19 0727  BP: 120/65  135/69 112/83  Pulse: 88     Resp: (!) 26   20  Temp: 98.1 F (36.7 C)  98.3 F (36.8 C) 98.6 F (37 C)  TempSrc: Oral  Oral Oral  SpO2: 95%   92%  Weight:  (!) 139.5 kg    Height:        Intake/Output Summary (Last 24 hours) at 06/12/2019 0934 Last data filed at 06/12/2019 0830 Gross per 24 hour  Intake 475 ml  Output 2800 ml  Net -2325 ml   Filed Weights   06/10/19 0443 06/11/19 0500 06/12/19 0500  Weight: (!) 138.2 kg (!) 139 kg (!) 139.5 kg  General appearance: Awake alert.  In no distress.  Morbidly obese Resp: Diminished air entry at the bases.  Difficult examination due to body habitus.  No obvious wheezing or rhonchi. Cardio: S1-S2 is normal regular.  No S3-S4.  No rubs murmurs or bruit.  Telemetry shows sinus rhythm GI: Abdomen is soft.  Obese.  Difficult examination due to body habitus.  Neurologic: Alert and oriented x3.  No focal neurological deficits.    Lab Results:  Data Reviewed: I have personally reviewed following labs and imaging studies  CBC: Recent Labs  Lab 06/06/19 0508 06/07/19 0645 06/08/19 0515 06/09/19 0517 06/10/19 0459 06/11/19 1030 06/12/19 0637  WBC 4.3 4.5 2.8* 3.7* 3.9* 10.8* 7.0  NEUTROABS 3.8 3.8 2.2 3.0 3.2  --   --   HGB 9.2* 9.7* 10.0* 10.2* 8.3* 10.8* 9.5*  HCT 30.7* 31.3* 33.3* 34.7* 28.3* 36.8 33.6*  MCV 98.1 96.0 97.4 98.0 97.3 99.2 100.6*  PLT 84* 90* 85* 122* 124* 205 951    Basic Metabolic Panel: Recent Labs  Lab 06/08/19 0515 06/09/19 0517 06/10/19 0459 06/10/19 1120 06/11/19 0809 06/11/19 1030 06/12/19 0637  NA 142 143 154* 141 QUESTIONABLE RESULTS, RECOMMEND RECOLLECT TO VERIFY 141 144  K 3.8 3.6  2.7* 3.3* QUESTIONABLE RESULTS, RECOMMEND RECOLLECT TO VERIFY 3.3* 3.9  CL 85* 86* 94* 90* QUESTIONABLE RESULTS, RECOMMEND RECOLLECT TO VERIFY 85* 90*  CO2 43* 44* 36* 40* QUESTIONABLE RESULTS, RECOMMEND RECOLLECT TO VERIFY 43* 46*  GLUCOSE 346* 247* 269* 286* QUESTIONABLE RESULTS, RECOMMEND RECOLLECT TO VERIFY 221* 78  BUN 43* 43* 40* 48* QUESTIONABLE RESULTS, RECOMMEND RECOLLECT TO VERIFY 49* 38*  CREATININE 0.84 0.79 1.16* 0.75 QUESTIONABLE RESULTS, RECOMMEND RECOLLECT TO VERIFY 0.83 0.56  CALCIUM 8.6* 9.2 6.9* 9.4 QUESTIONABLE RESULTS, RECOMMEND RECOLLECT TO VERIFY 9.3 9.8  MG 1.9 2.2 1.7  --  QUESTIONABLE RESULTS, RECOMMEND RECOLLECT TO VERIFY 2.3  --     GFR: Estimated Creatinine Clearance: 105.2 mL/min (by C-G formula based on SCr of 0.56 mg/dL).  Liver Function Tests: Recent Labs  Lab 06/07/19 0645 06/08/19 0515 06/09/19 0517 06/10/19 0459 06/10/19 1120  AST 21 20 17  10* 20  ALT 34 32 32 22 31  ALKPHOS 102 100 100 72 101  BILITOT 0.3 0.3 0.3 0.3 0.4  PROT 6.1* 6.0* 5.9* <3.0* 6.1*  ALBUMIN 2.6* 2.5* 2.6* 2.0* 2.7*     Cardiac Enzymes: Recent Labs  Lab 06/05/19 1410  CKTOTAL 143    Anemia Panel: Recent Labs    06/10/19 0459  FERRITIN 234    Recent Results (from the past 240 hour(s))  Blood Culture (routine x 2)     Status: Abnormal   Collection Time: 06/03/2019  5:00 PM   Specimen: BLOOD  Result Value Ref Range Status   Specimen Description   Final    BLOOD LEFT ARM Performed at [facility] [address] [place], [place] [address]    Special Requests   Final    BOTTLES DRAWN AEROBIC AND ANAEROBIC Blood Culture adequate volume Performed at [facility] [address], [place],  [address]    Culture  Setup Time   Final    GRAM POSITIVE COCCI AEROBIC BOTTLE ONLY CRITICAL RESULT CALLED TO, READ BACK BY AND VERIFIED WITH: N.BATCHEOBER,PHARMD 0120 06/06/2019 M.CAMPBELL    Culture (A)  Final     STAPHYLOCOCCUS SPECIES (COAGULASE NEGATIVE) THE SIGNIFICANCE OF ISOLATING THIS ORGANISM FROM A SINGLE SET OF BLOOD CULTURES WHEN MULTIPLE SETS ARE DRAWN IS UNCERTAIN. PLEASE  NOTIFY THE MICROBIOLOGY DEPARTMENT WITHIN ONE WEEK IF SPECIATION AND SENSITIVITIES ARE REQUIRED. Performed at [facility], [place] [address], [place], [place] [address]    Report Status 06/08/2019 FINAL  Final  Blood Culture ID Panel (Reflexed)     Status: Abnormal   Collection Time: 05/25/2019  5:00 PM  Result Value Ref Range Status   Enterococcus species NOT DETECTED NOT DETECTED Final   Listeria monocytogenes NOT DETECTED NOT DETECTED Final   Staphylococcus species DETECTED (A) NOT DETECTED Final    Comment: Methicillin (oxacillin) resistant coagulase negative staphylococcus. Possible blood culture contaminant (unless isolated from more than one blood culture draw or clinical case suggests pathogenicity). No antibiotic treatment is indicated for blood  culture contaminants. CRITICAL RESULT CALLED TO, READ BACK BY AND VERIFIED WITH: N.BATCHEOBER,PHARMD 0120 06/06/2019 M.CAMPBELL    Staphylococcus aureus (BCID) NOT DETECTED NOT DETECTED Final   Methicillin resistance DETECTED (A) NOT DETECTED Final    Comment: CRITICAL RESULT CALLED TO, READ BACK BY AND VERIFIED WITH: N.BATCHEOBER,PHARMD 0120 06/06/2019 M.CAMPBELL    Streptococcus species NOT DETECTED NOT DETECTED Final   Streptococcus agalactiae NOT DETECTED NOT DETECTED Final   Streptococcus pneumoniae NOT DETECTED NOT DETECTED Final   Streptococcus pyogenes NOT DETECTED NOT DETECTED Final   Acinetobacter baumannii NOT DETECTED NOT DETECTED Final   Enterobacteriaceae species NOT DETECTED NOT DETECTED Final   Enterobacter cloacae complex NOT DETECTED NOT DETECTED Final   Escherichia coli NOT DETECTED NOT DETECTED Final   Klebsiella oxytoca NOT DETECTED NOT DETECTED Final   Klebsiella pneumoniae NOT DETECTED NOT DETECTED Final   Proteus species NOT DETECTED NOT  DETECTED Final   Serratia marcescens NOT DETECTED NOT DETECTED Final   Haemophilus influenzae NOT DETECTED NOT DETECTED Final   Neisseria meningitidis NOT DETECTED NOT DETECTED Final   Pseudomonas aeruginosa NOT DETECTED NOT DETECTED Final   Candida albicans NOT DETECTED NOT DETECTED Final   Candida glabrata NOT DETECTED NOT DETECTED Final   Candida krusei NOT DETECTED NOT DETECTED Final   Candida parapsilosis NOT DETECTED NOT DETECTED Final   Candida tropicalis NOT DETECTED NOT DETECTED Final    Comment: Performed at [facility], [place] [address], [place], [place] [address]  SARS Coronavirus 2 (CEPHEID- Performed in [facility] hospital lab), Hosp Order     Status: Abnormal   Collection Time: 06/11/2019  7:51 PM   Specimen: Nasopharyngeal Swab  Result Value Ref Range Status   SARS Coronavirus 2 POSITIVE (A) NEGATIVE Final    Comment: RESULT CALLED TO, READ BACK BY AND VERIFIED WITH: RHONEY A @2246  ON 06/12/2019 JACKSON,K (NOTE) If result is NEGATIVE SARS-CoV-2 target nucleic acids are NOT DETECTED. The SARS-CoV-2 RNA is generally detectable in upper and lower  respiratory specimens during the acute phase of infection. The lowest  concentration of SARS-CoV-2 viral copies this assay can detect is 250  copies / mL. A negative result does not preclude SARS-CoV-2 infection  and should not be used as the sole basis for treatment or other  patient management decisions.  A negative result may occur with  improper specimen collection / handling, submission of specimen other  than nasopharyngeal swab, presence of viral mutation(s) within the  areas targeted by this assay, and inadequate number of viral copies  (<250 copies / mL). A negative result must be combined with clinical  observations, patient history, and epidemiological information. If result is POSITIVE SARS-CoV-2 target nucleic acids are DETECTED.  The SARS-CoV-2 RNA is generally detectable in upper and lower  respiratory  specimens during the acute phase of infection.  Positive  results are indicative of active infection with SARS-CoV-2.  Clinical  correlation with patient history and other diagnostic information is  necessary to determine patient infection status.  Positive results do  not rule out bacterial infection or co-infection with other viruses. If result is PRESUMPTIVE POSTIVE SARS-CoV-2 nucleic acids MAY BE PRESENT.   A presumptive positive result was obtained on the submitted specimen  and confirmed on repeat testing.  While 2019 novel coronavirus  (SARS-CoV-2) nucleic acids may be present in the submitted sample  additional confirmatory testing may be necessary for epidemiological  and / or clinical management purposes  to differentiate between  SARS-CoV-2 and other Sarbecovirus currently known to infect humans.  If clinically indicated additional testing with an alternate test  methodology [phone])  is advised. The SARS-CoV-2 RNA is generally  detectable in upper and lower respiratory specimens during the acute  phase of infection. The expected result is Negative. Fact Sheet for Patients:  StrictlyIdeas.no Fact Sheet for Healthcare Providers: BankingDealers.co.za This test is not yet approved or cleared by the [place] FDA and has been authorized for detection and/or diagnosis of SARS-CoV-2 by FDA under an Emergency Use Authorization (EUA).  This EUA will remain in effect (meaning this test can be used) for the duration of the COVID-19 declaration under Section 564(b)(1) of the Act, 21 U.S.C. section 360bbb-3(b)(1), unless the authorization is terminated or revoked sooner. Performed at [facility], [place] [address], [place], [place] [address]   Culture, sputum-assessment     Status: None   Collection Time: 06/05/19 12:30 PM   Specimen: Sputum  Result Value Ref Range Status   Specimen Description SPUTUM  Final    Special Requests NONE  Final   Sputum evaluation   Final    THIS SPECIMEN IS ACCEPTABLE FOR SPUTUM CULTURE Performed at [facility], [place] [address], [place], [place] [address]    Report Status 06/05/2019 FINAL  Final  Culture, respiratory     Status: None   Collection Time: 06/05/19 12:30 PM   Specimen: SPU  Result Value Ref Range Status   Specimen Description   Final    SPUTUM Performed at [facility] [address] [place], [place] [address]    Special Requests   Final    NONE Reflexed from S2110 Performed at [facility], [place] [address] [place], [place] [address]    Gram Stain   Final    FEW WBC PRESENT,BOTH PMN AND MONONUCLEAR FEW YEAST RARE GRAM POSITIVE RODS    Culture   Final    Consistent with normal respiratory flora. Performed at [facility], [place] [address], [place], [place] [address]    Report Status 06/08/2019 FINAL  Final      Radiology Studies: No results found.     LOS: 8 days   Ruth Tully [employer] on [email or web address]  06/12/2019, 9:34 AM

## 2019-06-13 LAB — CBC
HCT: 32.3 % — ABNORMAL LOW (ref 36.0–46.0)
Hemoglobin: 9.1 g/dL — ABNORMAL LOW (ref 12.0–15.0)
MCH: 28.8 pg (ref 26.0–34.0)
MCHC: 28.2 g/dL — ABNORMAL LOW (ref 30.0–36.0)
MCV: 102.2 fL — ABNORMAL HIGH (ref 80.0–100.0)
Platelets: 233 10*3/uL (ref 150–400)
RBC: 3.16 MIL/uL — ABNORMAL LOW (ref 3.87–5.11)
RDW: 15.6 % — ABNORMAL HIGH (ref 11.5–15.5)
WBC: 5.8 10*3/uL (ref 4.0–10.5)
nRBC: 0 % (ref 0.0–0.2)

## 2019-06-13 LAB — BASIC METABOLIC PANEL
Anion gap: 9 (ref 5–15)
BUN: 32 mg/dL — ABNORMAL HIGH (ref 6–20)
CO2: 43 mmol/L — ABNORMAL HIGH (ref 22–32)
Calcium: 9.7 mg/dL (ref 8.9–10.3)
Chloride: 91 mmol/L — ABNORMAL LOW (ref 98–111)
Creatinine, Ser: 0.57 mg/dL (ref 0.44–1.00)
GFR calc Af Amer: >60 mL/min (ref >60)
GFR calc non Af Amer: >60 mL/min (ref >60)
Glucose, Bld: 124 mg/dL — ABNORMAL HIGH (ref 70–99)
Potassium: 4.4 mmol/L (ref 3.5–5.1)
Sodium: 143 mmol/L (ref 135–145)

## 2019-06-13 LAB — GLUCOSE, CAPILLARY
Glucose-Capillary: 97 mg/dL (ref 70–99)
Glucose-Capillary: 326 mg/dL — ABNORMAL HIGH (ref 70–99)
Glucose-Capillary: 286 mg/dL — ABNORMAL HIGH (ref 70–99)
Glucose-Capillary: 241 mg/dL — ABNORMAL HIGH (ref 70–99)

## 2019-06-13 MED ORDER — FUROSEMIDE 10 MG/ML IJ SOLN
40.0000 mg | Freq: Once | INTRAMUSCULAR | Status: AC
  Administered 2019-06-13: 40 mg via INTRAVENOUS
  Filled 2019-06-13: qty 4

## 2019-06-13 NOTE — Progress Notes (Signed)
[place] for Apixaban Indication: atrial fibrillation  Allergies  Allergen Reactions  . Aspirin Hives    Patient Measurements: Height: 5\' 2"  (157.5 cm) Weight: (!) 308 lb 3.3 oz (139.8 kg) IBW/kg (Calculated) : 50.1  Vital Signs: Temp: 98 F (36.7 C) (06/28 0736) Temp Source: Oral (06/28 0736) BP: 117/57 (06/28 0736) Pulse Rate: 89 (06/28 0736)  Labs: Recent Labs    06/11/19 1030 06/12/19 0637 06/13/19 0600  HGB 10.8* 9.5* 9.1*  HCT 36.8 33.6* 32.3*  PLT 205 217 233  CREATININE 0.83 0.56 0.57    Estimated Creatinine Clearance: 105.3 mL/min (by C-G formula based on SCr of 0.57 mg/dL).   Medical History: Past Medical History:  Diagnosis Date  . Arthritis    "I feel like it's everywhere" (09/24/2017  . Cellulitis and abscess of lower extremity 08/16/2016   right leg  . CHF (congestive heart failure) ([place])   . Chronic lower back pain   . COPD (chronic obstructive pulmonary disease) ([place])   . Depression   . Fibromyalgia   . Headache    "once q couple months now" (09/24/2017)  . Hyperlipidemia   . Hypertension   . HYPERTENSION, BENIGN SYSTEMIC 02/12/2007  . Memory disturbance   . Migraines 1970's - <2000   "they just went away"  . Neuropathy    "bad in my right hand and in both feet" (09/24/2017)  . Normal echocardiogram 05/30/05   suboptimal study  . Obesity, morbid (more than 100 lbs over ideal weight or BMI > 40) (H[facility])    obese since childhood  . On home oxygen therapy    "4L; 24/7" (09/24/2017)  . Osteoarthritis   . Peripheral edema   . Pressure ulcer of foot    left  . Sepsis ([place])   . Shortness of breath dyspnea   . Sleep apnea 10/2014   "couldn't tolerate the mask" (09/24/2017)  . Type II diabetes mellitus [facility])     Assessment: 58 y/o F with a h/o COPD and CHF admitted for worsening respiratory failure and found to be positive for SARS-CoV-2. Patient started on apixaban for new-onset atrial  fibrillation. H/H low stable, Plt wnl. SCr wnl   CHA2DS2-VASc=4  Plan:  - Continue apixaban 5 mg bid.  - Monitor renal function, CBC, signs of bleeding   Albertina Parr, PharmD., BCPS Clinical Pharmacist Clinical phone for 06/13/19 until 5pm: [phone]

## 2019-06-13 NOTE — Progress Notes (Signed)
PROGRESS NOTE  Kathleen Roy IDP:[id number] DOB: 1961-08-13 DOA: 06/07/2019  PCP: [name], MD  Brief History/Interval Summary: 58 y.o.femalewith medical history significant forCOPD, chronic respiratory failure, type 2 diabetes mellitus, hypertension, obstructive sleep apnea, and chronic diastolic CHF,  presenting to the emergency department from a nursing facility for evaluation of worsening lethargy and increased supplemental oxygen requirement., Was COVID-19 positive, as well work-up significant for aspiration pneumonia, worsening respiratory failure, she developed A. fib with RVR 06/07/2019.   Reason for Visit: Acute respiratory failure with hypoxia  Consultants: None  Procedures: PICC line in right upper extremity  Antibiotics: Unasyn was changed over to Augmentin  Subjective/Interval History: Patient states that she feels well.  No new complaints offered.  No obvious bleeding noted.  Remains in sinus rhythm.   Assessment/Plan:  Acute respiratory disease due to Acute Covid 19 Viral Illness/aspiration pneumonia Patient had elevated inflammatory markers.  She had high oxygen requirements.  She was started on Remdesivir and IV steroids.  She completed 5-day course of Remdesivir.  She was also noted to have elevated procalcitonin and there was concern for aspiration pneumonia.  As a result she was not given Actemra.  She was actually placed on antibacterials.  She was on Unasyn which was changed over to Augmentin.  At baseline she is on 4 L of oxygen by nasal cannula.  Required higher oxygen by nasal cannula this morning when she was being moved in the bed.  Sepsis, present on admission/secondary to aspiration pneumonia Thought to be due to aspiration pneumonia and COVID-19 infection.  Sepsis has resolved.  Patient had a dense right lung base opacity.  Patient was empirically on ceftriaxone azithromycin and vancomycin.  She was switched over to Unasyn and then to Augmentin.  Part  of the problem appears to be that the patient tends to be more supine when she is eating and drinking.  She was told to be as upright as possible.  She says this is difficult due to her back issues.      Acute on chronic hypoxic respiratory failure/hypercapnic respiratory failure At baseline she is on 4 L of oxygen by nasal cannula.  She was requiring high amounts of oxygen during earlier part of this hospitalization.  She had been weaned down to 5 L of oxygen by nasal cannula yesterday.  Noted to be on higher oxygen this morning.  We will wean down.  Discussed with nursing staff.  Atrial fibrillation with RVR Patient went into atrial fibrillation with RVR on 6/22.  Initially on Cardizem infusion.  And she was placed on amiodarone infusion.  She was placed on Eliquis.  Chads 2 vascular score is 4.  She converted to sinus rhythm.  She is now on oral amiodarone.  No overt bleeding has been noted although there was some concern for pink-colored urine.  Hemoglobin remained stable.  No overt bleeding.  Continue to monitor.  History of COPD with acute exacerbation Appears to have improved.  Continue inhalers.   Acute on chronic diastolic CHF Thought to have significant volume overload even though BNP was normal.  Patient takes torsemide 40 mg twice daily at home.  She was switched over to intravenous Lasix 80 mg every 8 hours.  She diuresed well.  Weight has decreased significantly.  From 145 kg to 139.5 kg.  Lasix dose was reduced to once a day.  She was also given acetazolamide for contraction alkalosis.  However she does have chronically elevated bicarbonate levels.  This is compensatory  due to elevated PCO2.  Change to oral Lasix tomorrow.  Hypokalemia Potassium 4.4 today.  Essential hypertension Blood pressure is stable.  Metoprolol was discontinued during earlier part of this hospitalization due to low blood pressures.    Acute metabolic encephalopathy Most likely due to acute illness.  Seems to  be back to her baseline now.  Morbid obesity BMI 58.53  Diabetes mellitus type 2 with episodes of hypoglycemia Patient was on high doses of long-acting insulin.  She was noted to have hypoglycemia yesterday.  This is likely because steroid has been tapered down.  Long-acting insulin was held.  HbA1c was 5.5 in May.  CBG noted to be 97 this morning.  Continue to monitor.  May need lower dose of long-acting insulin.  Continue SSI.     Leukopenia and mild thrombocytopenia Continue to monitor closely.  Normocytic anemia No clear evidence for overt bleeding although there was some concern for pink-colored urine.  Hemoglobin low but stable.  Patient is on Eliquis.  DVT Prophylaxis: On apixaban PUD Prophylaxis: Pepcid Code Status: DNR Family Communication: Discussed with the patient Disposition Plan: She is here from a skilled nursing facility.  Hopefully discharge in 2 to 3 days.   Medications:  Scheduled: . acidophilus  1 capsule Oral Daily  . amiodarone  200 mg Oral BID  . amoxicillin-clavulanate  1 tablet Oral Q12H  . apixaban  5 mg Oral BID  . ARIPiprazole  2 mg Oral QODAY  . feeding supplement (PRO-STAT SUGAR FREE 64)  30 mL Oral BID  . fluticasone furoate-vilanterol  1 puff Inhalation Daily  . furosemide  80 mg Intravenous Daily  . insulin aspart  0-20 Units Subcutaneous TID WC  . insulin aspart  0-5 Units Subcutaneous QHS  . lamoTRIgine  25 mg Oral Daily  . metoprolol succinate  12.5 mg Oral Daily  . polyethylene glycol  17 g Oral QODAY  . pravastatin  40 mg Oral q1800  . predniSONE  20 mg Oral Q breakfast  . pregabalin  100 mg Oral BID  . senna  2 tablet Oral QHS  . sodium chloride flush  10-40 mL Intracatheter Q12H  . sodium chloride flush  3 mL Intravenous Q12H  . trazodone  300 mg Oral QHS  . umeclidinium bromide  1 puff Inhalation Daily  . venlafaxine XR  300 mg Oral Daily  . venlafaxine XR  37.5 mg Oral Daily   Continuous: . sodium chloride 10 mL/hr at 06/10/19  0044   ZOX:WRUEAV chloride, acetaminophen, albuterol, loperamide, oxyCODONE, sodium chloride flush   Objective:  Vital Signs  Vitals:   06/12/19 2050 06/13/19 0500 06/13/19 0610 06/13/19 0736  BP:   110/79 (!) 117/57  Pulse: 92  84 89  Resp: 20  15 20   Temp:   97.9 F (36.6 C) 98 F (36.7 C)  TempSrc:   Oral Oral  SpO2: 92%  100% 91%  Weight:  (!) 139.8 kg    Height:        Intake/Output Summary (Last 24 hours) at 06/13/2019 1117 Last data filed at 06/13/2019 0800 Gross per 24 hour  Intake 320 ml  Output 2525 ml  Net -2205 ml   Filed Weights   06/11/19 0500 06/12/19 0500 06/13/19 0500  Weight: (!) 139 kg (!) 139.5 kg (!) 139.8 kg   General appearance: Awake alert.  In no distress.  Morbidly obese Resp: Diminished air entry at the bases.  Difficult exam due to body habitus.   Cardio: S1-S2 is  normal regular.  No S3-S4.  No rubs murmurs or bruit GI: Abdomen is soft.  These.  Difficult exam due to body habitus.  Skin changes reflective of venous stasis rather than an infectious etiology. Neurologic: Alert and oriented x3.  No focal neurological deficits.    Lab Results:  Data Reviewed: I have personally reviewed following labs and imaging studies  CBC: Recent Labs  Lab 06/07/19 0645 06/08/19 0515 06/09/19 0517 06/10/19 0459 06/11/19 1030 06/12/19 0637 06/13/19 0600  WBC 4.5 2.8* 3.7* 3.9* 10.8* 7.0 5.8  NEUTROABS 3.8 2.2 3.0 3.2  --   --   --   HGB 9.7* 10.0* 10.2* 8.3* 10.8* 9.5* 9.1*  HCT 31.3* 33.3* 34.7* 28.3* 36.8 33.6* 32.3*  MCV 96.0 97.4 98.0 97.3 99.2 100.6* 102.2*  PLT 90* 85* 122* 124* 205 217 841    Basic Metabolic Panel: Recent Labs  Lab 06/08/19 0515 06/09/19 0517 06/10/19 0459 06/10/19 1120 06/11/19 0809 06/11/19 1030 06/12/19 0637 06/13/19 0600  NA 142 143 154* 141 QUESTIONABLE RESULTS, RECOMMEND RECOLLECT TO VERIFY 141 144 143  K 3.8 3.6 2.7* 3.3* QUESTIONABLE RESULTS, RECOMMEND RECOLLECT TO VERIFY 3.3* 3.9 4.4  CL 85* 86* 94*  90* QUESTIONABLE RESULTS, RECOMMEND RECOLLECT TO VERIFY 85* 90* 91*  CO2 43* 44* 36* 40* QUESTIONABLE RESULTS, RECOMMEND RECOLLECT TO VERIFY 43* 46* 43*  GLUCOSE 346* 247* 269* 286* QUESTIONABLE RESULTS, RECOMMEND RECOLLECT TO VERIFY 221* 78 124*  BUN 43* 43* 40* 48* QUESTIONABLE RESULTS, RECOMMEND RECOLLECT TO VERIFY 49* 38* 32*  CREATININE 0.84 0.79 1.16* 0.75 QUESTIONABLE RESULTS, RECOMMEND RECOLLECT TO VERIFY 0.83 0.56 0.57  CALCIUM 8.6* 9.2 6.9* 9.4 QUESTIONABLE RESULTS, RECOMMEND RECOLLECT TO VERIFY 9.3 9.8 9.7  MG 1.9 2.2 1.7  --  QUESTIONABLE RESULTS, RECOMMEND RECOLLECT TO VERIFY 2.3  --   --     GFR: Estimated Creatinine Clearance: 105.3 mL/min (by C-G formula based on SCr of 0.57 mg/dL).  Liver Function Tests: Recent Labs  Lab 06/07/19 0645 06/08/19 0515 06/09/19 0517 06/10/19 0459 06/10/19 1120  AST 21 20 17  10* 20  ALT 34 32 32 22 31  ALKPHOS 102 100 100 72 101  BILITOT 0.3 0.3 0.3 0.3 0.4  PROT 6.1* 6.0* 5.9* <3.0* 6.1*  ALBUMIN 2.6* 2.5* 2.6* 2.0* 2.7*      Recent Results (from the past 240 hour(s))  Blood Culture (routine x 2)     Status: Abnormal   Collection Time: 06/15/2019  5:00 PM   Specimen: BLOOD  Result Value Ref Range Status   Specimen Description   Final    BLOOD LEFT ARM Performed at [facility] [address] [place], [place] [address]    Special Requests   Final    BOTTLES DRAWN AEROBIC AND ANAEROBIC Blood Culture adequate volume Performed at [facility] [address] [place], [place] [address]    Culture  Setup Time   Final    GRAM POSITIVE COCCI AEROBIC BOTTLE ONLY CRITICAL RESULT CALLED TO, READ BACK BY AND VERIFIED WITH: N.BATCHEOBER,PHARMD 0120 06/06/2019 M.CAMPBELL    Culture (A)  Final    STAPHYLOCOCCUS SPECIES (COAGULASE NEGATIVE) THE SIGNIFICANCE OF ISOLATING THIS ORGANISM FROM A SINGLE SET OF BLOOD CULTURES WHEN MULTIPLE SETS ARE DRAWN IS UNCERTAIN. PLEASE NOTIFY THE MICROBIOLOGY  DEPARTMENT WITHIN ONE WEEK IF SPECIATION AND SENSITIVITIES ARE REQUIRED. Performed at [facility], [place] [address] [place], [place] [address]    Report Status 06/08/2019 FINAL  Final  Blood Culture ID Panel (Reflexed)  Status: Abnormal   Collection Time: 06/06/2019  5:00 PM  Result Value Ref Range Status   Enterococcus species NOT DETECTED NOT DETECTED Final   Listeria monocytogenes NOT DETECTED NOT DETECTED Final   Staphylococcus species DETECTED (A) NOT DETECTED Final    Comment: Methicillin (oxacillin) resistant coagulase negative staphylococcus. Possible blood culture contaminant (unless isolated from more than one blood culture draw or clinical case suggests pathogenicity). No antibiotic treatment is indicated for blood  culture contaminants. CRITICAL RESULT CALLED TO, READ BACK BY AND VERIFIED WITH: N.BATCHEOBER,PHARMD 0120 06/06/2019 M.CAMPBELL    Staphylococcus aureus (BCID) NOT DETECTED NOT DETECTED Final   Methicillin resistance DETECTED (A) NOT DETECTED Final    Comment: CRITICAL RESULT CALLED TO, READ BACK BY AND VERIFIED WITH: N.BATCHEOBER,PHARMD 0120 06/06/2019 M.CAMPBELL    Streptococcus species NOT DETECTED NOT DETECTED Final   Streptococcus agalactiae NOT DETECTED NOT DETECTED Final   Streptococcus pneumoniae NOT DETECTED NOT DETECTED Final   Streptococcus pyogenes NOT DETECTED NOT DETECTED Final   Acinetobacter baumannii NOT DETECTED NOT DETECTED Final   Enterobacteriaceae species NOT DETECTED NOT DETECTED Final   Enterobacter cloacae complex NOT DETECTED NOT DETECTED Final   Escherichia coli NOT DETECTED NOT DETECTED Final   Klebsiella oxytoca NOT DETECTED NOT DETECTED Final   Klebsiella pneumoniae NOT DETECTED NOT DETECTED Final   Proteus species NOT DETECTED NOT DETECTED Final   Serratia marcescens NOT DETECTED NOT DETECTED Final   Haemophilus influenzae NOT DETECTED NOT DETECTED Final   Neisseria meningitidis NOT DETECTED NOT DETECTED Final    Pseudomonas aeruginosa NOT DETECTED NOT DETECTED Final   Candida albicans NOT DETECTED NOT DETECTED Final   Candida glabrata NOT DETECTED NOT DETECTED Final   Candida krusei NOT DETECTED NOT DETECTED Final   Candida parapsilosis NOT DETECTED NOT DETECTED Final   Candida tropicalis NOT DETECTED NOT DETECTED Final    Comment: Performed at [facility], [place] [address], [place], [place] [address]  SARS Coronavirus 2 (CEPHEID- Performed in [place] hospital lab), Hosp Order     Status: Abnormal   Collection Time: 05/24/2019  7:51 PM   Specimen: Nasopharyngeal Swab  Result Value Ref Range Status   SARS Coronavirus 2 POSITIVE (A) NEGATIVE Final    Comment: RESULT CALLED TO, READ BACK BY AND VERIFIED WITH: RHONEY A @2246  ON 05/30/2019 JACKSON,K (NOTE) If result is NEGATIVE SARS-CoV-2 target nucleic acids are NOT DETECTED. The SARS-CoV-2 RNA is generally detectable in upper and lower  respiratory specimens during the acute phase of infection. The lowest  concentration of SARS-CoV-2 viral copies this assay can detect is 250  copies / mL. A negative result does not preclude SARS-CoV-2 infection  and should not be used as the sole basis for treatment or other  patient management decisions.  A negative result may occur with  improper specimen collection / handling, submission of specimen other  than nasopharyngeal swab, presence of viral mutation(s) within the  areas targeted by this assay, and inadequate number of viral copies  (<250 copies / mL). A negative result must be combined with clinical  observations, patient history, and epidemiological information. If result is POSITIVE SARS-CoV-2 target nucleic acids are DETECTED.  The SARS-CoV-2 RNA is generally detectable in upper and lower  respiratory specimens during the acute phase of infection.  Positive  results are indicative of active infection with SARS-CoV-2.  Clinical  correlation with patient history and other diagnostic  information is  necessary to determine patient infection status.  Positive results do  not  rule out bacterial infection or co-infection with other viruses. If result is PRESUMPTIVE POSTIVE SARS-CoV-2 nucleic acids MAY BE PRESENT.   A presumptive positive result was obtained on the submitted specimen  and confirmed on repeat testing.  While 2019 novel coronavirus  (SARS-CoV-2) nucleic acids may be present in the submitted sample  additional confirmatory testing may be necessary for epidemiological  and / or clinical management purposes  to differentiate between  SARS-CoV-2 and other Sarbecovirus currently known to infect humans.  If clinically indicated additional testing with an alternate test  methodology [phone])  is advised. The SARS-CoV-2 RNA is generally  detectable in upper and lower respiratory specimens during the acute  phase of infection. The expected result is Negative. Fact Sheet for Patients:  StrictlyIdeas.no Fact Sheet for Healthcare Providers: BankingDealers.co.za This test is not yet approved or cleared by the [place] FDA and has been authorized for detection and/or diagnosis of SARS-CoV-2 by FDA under an Emergency Use Authorization (EUA).  This EUA will remain in effect (meaning this test can be used) for the duration of the COVID-19 declaration under Section 564(b)(1) of the Act, 21 U.S.C. section 360bbb-3(b)(1), unless the authorization is terminated or revoked sooner. Performed at [facility], [place] [address], [place], [place] [address]   Culture, sputum-assessment     Status: None   Collection Time: 06/05/19 12:30 PM   Specimen: Sputum  Result Value Ref Range Status   Specimen Description SPUTUM  Final   Special Requests NONE  Final   Sputum evaluation   Final    THIS SPECIMEN IS ACCEPTABLE FOR SPUTUM CULTURE Performed at [facility], [place] [address]  [place], [place] [address]    Report Status 06/05/2019 FINAL  Final  Culture, respiratory     Status: None   Collection Time: 06/05/19 12:30 PM   Specimen: SPU  Result Value Ref Range Status   Specimen Description   Final    SPUTUM Performed at [facility] [address], [place], [place] [address]    Special Requests   Final    NONE Reflexed from S2110 Performed at [facility], [place] [address] [place], [place] [address]    Gram Stain   Final    FEW WBC PRESENT,BOTH PMN AND MONONUCLEAR FEW YEAST RARE GRAM POSITIVE RODS    Culture   Final    Consistent with normal respiratory flora. Performed at [facility], [place] [address], [place], [place] [address]    Report Status 06/08/2019 FINAL  Final      Radiology Studies: No results found.     LOS: 9 days   Layne Lebon [employer] on [email or web address]  06/13/2019, 11:17 AM

## 2019-06-14 ENCOUNTER — Inpatient Hospital Stay (HOSPITAL_COMMUNITY): Payer: Medicare Other

## 2019-06-14 DIAGNOSIS — J690 Pneumonitis due to inhalation of food and vomit: Secondary | ICD-10-CM

## 2019-06-14 LAB — GLUCOSE, CAPILLARY
Glucose-Capillary: 234 mg/dL — ABNORMAL HIGH (ref 70–99)
Glucose-Capillary: 134 mg/dL — ABNORMAL HIGH (ref 70–99)
Glucose-Capillary: 127 mg/dL — ABNORMAL HIGH (ref 70–99)
Glucose-Capillary: 108 mg/dL — ABNORMAL HIGH (ref 70–99)
Glucose-Capillary: 312 mg/dL — ABNORMAL HIGH (ref 70–99)
Glucose-Capillary: 296 mg/dL — ABNORMAL HIGH (ref 70–99)

## 2019-06-14 LAB — BASIC METABOLIC PANEL
Anion gap: 11 (ref 5–15)
BUN: 34 mg/dL — ABNORMAL HIGH (ref 6–20)
CO2: 45 mmol/L — ABNORMAL HIGH (ref 22–32)
Calcium: 9.9 mg/dL (ref 8.9–10.3)
Chloride: 86 mmol/L — ABNORMAL LOW (ref 98–111)
Creatinine, Ser: 0.69 mg/dL (ref 0.44–1.00)
GFR calc Af Amer: >60 mL/min (ref >60)
GFR calc non Af Amer: >60 mL/min (ref >60)
Glucose, Bld: 137 mg/dL — ABNORMAL HIGH (ref 70–99)
Potassium: 3.8 mmol/L (ref 3.5–5.1)
Sodium: 142 mmol/L (ref 135–145)

## 2019-06-14 LAB — CBC
HCT: 33 % — ABNORMAL LOW (ref 36.0–46.0)
Hemoglobin: 9.2 g/dL — ABNORMAL LOW (ref 12.0–15.0)
MCH: 28.6 pg (ref 26.0–34.0)
MCHC: 27.9 g/dL — ABNORMAL LOW (ref 30.0–36.0)
MCV: 102.5 fL — ABNORMAL HIGH (ref 80.0–100.0)
Platelets: 269 10*3/uL (ref 150–400)
RBC: 3.22 MIL/uL — ABNORMAL LOW (ref 3.87–5.11)
RDW: 15.6 % — ABNORMAL HIGH (ref 11.5–15.5)
WBC: 6.1 10*3/uL (ref 4.0–10.5)
nRBC: 0.3 % — ABNORMAL HIGH (ref 0.0–0.2)

## 2019-06-14 MED ORDER — GUAIFENESIN ER 600 MG PO TB12
1200.0000 mg | ORAL_TABLET | Freq: Two times a day (BID) | ORAL | Status: DC
  Administered 2019-06-14 – 2019-06-16 (×5): 1200 mg via ORAL
  Filled 2019-06-14 (×5): qty 2

## 2019-06-14 MED ORDER — POTASSIUM CHLORIDE CRYS ER 20 MEQ PO TBCR
40.0000 meq | EXTENDED_RELEASE_TABLET | Freq: Once | ORAL | Status: AC
  Administered 2019-06-14: 40 meq via ORAL
  Filled 2019-06-14: qty 2

## 2019-06-14 MED ORDER — ALBUTEROL SULFATE HFA 108 (90 BASE) MCG/ACT IN AERS
2.0000 | INHALATION_SPRAY | Freq: Four times a day (QID) | RESPIRATORY_TRACT | Status: DC
  Administered 2019-06-14 – 2019-06-16 (×9): 2 via RESPIRATORY_TRACT
  Filled 2019-06-14: qty 6.7

## 2019-06-14 NOTE — Progress Notes (Signed)
06/14/2019  0850  Per RT they can only do Flutter valve, no chest PT.

## 2019-06-14 NOTE — Progress Notes (Signed)
PROGRESS NOTE  Kathleen Roy LKT:[id number] DOB: [birth year] DOA: 06/08/2019  PCP: [name], MD  Brief History/Interval Summary: 58 y.o.femalewith medical history significant forCOPD, chronic respiratory failure, type 2 diabetes mellitus, hypertension, obstructive sleep apnea, and chronic diastolic CHF,  presenting to the emergency department from a nursing facility for evaluation of worsening lethargy and increased supplemental oxygen requirement., Was COVID-19 positive, as well work-up significant for aspiration pneumonia, worsening respiratory failure, she developed A. fib with RVR 06/07/2019.   Reason for Visit: Acute respiratory failure with hypoxia  Consultants: None  Procedures: PICC line in right upper extremity  Antibiotics: Unasyn was changed over to Augmentin  Subjective/Interval History: Patient states that she is feeling well.  She is a bit short of breath.  States that she has a continuous cough.  Patient was told that her x-ray looks worse on the right side this morning.  This is primarily because she is aspirating.  This is because she is in almost a flat position when she eats and drinks.  She was told that she needs to be upright as much as possible.   Assessment/Plan:  Acute respiratory disease due to Acute Covid 19 Viral Illness Patient had elevated inflammatory markers.  She had high oxygen requirements.  She was started on Remdesivir and IV steroids.  She completed 5-day course of Remdesivir.  She was also noted to have elevated procalcitonin and there was concern for aspiration pneumonia.  As a result she was not given Actemra.  Her oxygenation has worsened over the last 24 hours.  She is on 11 L of oxygen by high flow nasal cannula now.  See below.  Aspiration pneumonia/sepsis present on admission Patient was initially on ceftriaxone azithromycin and vancomycin.  She was switched over to Unasyn and then to Augmentin.  Patient had worsening oxygenation  yesterday.  Chest x-ray repeated this morning which showed dense consolidation in the right lung.  Most of this is due to aspiration.  Patient is in flat position when she eats and drinks.  This was explained to the patient today.  She will need to be upright.  Flutter valve.  Inhaler treatment.  High-dose Mucinex.  Continue Augmentin.  Could consider doing a CT scan of the chest to make sure there is no other process in that area.  We will first repeat chest x-ray tomorrow after above-mentioned interventions to see if there is any improvement.  Sepsis physiology had resolved.  Acute on chronic hypoxic respiratory failure/hypercapnic respiratory failure At baseline she is on 4 L of oxygen by nasal cannula.  She was requiring high amounts of oxygen during earlier part of this hospitalization.  She was weaned down to 5 L of oxygen by nasal cannula.  However her oxygenation has worsened due to aspiration as discussed above.   Atrial fibrillation with RVR Patient went into atrial fibrillation with RVR on 6/22.  Initially on Cardizem infusion.  And she was placed on amiodarone infusion.  She was placed on Eliquis.  Chads 2 vascular score is 4.  She converted to sinus rhythm.  She is now on oral amiodarone.  No overt bleeding has been noted although there was some concern for pink-colored urine.  Hemoglobin has been stable.  No overt bleeding.  Continue to monitor.    History of COPD with acute exacerbation Appears to have improved.  Continue inhalers.   Acute on chronic diastolic CHF Thought to have significant volume overload even though BNP was normal.  Patient takes torsemide 40  mg twice daily at home.  She was switched over to intravenous Lasix 80 mg every 8 hours.  She diuresed well.  Weight had improved from 145 kg 239.5 kg.  Noted to be 141 kg this morning.  Patient has chronic CO2 retention and has chronically elevated bicarbonate levels.  She was also given acetazolamide.    Hypokalemia Resolved.   Being repleted periodically.  Essential hypertension Blood pressure is stable.  Metoprolol was discontinued during earlier part of this hospitalization due to low blood pressures.    Acute metabolic encephalopathy Most likely due to acute illness.  Seems to be back to her baseline now.  Morbid obesity BMI 58.53  Diabetes mellitus type 2 with episodes of hypoglycemia Patient was on high doses of long-acting insulin.  She had episodes of hypoglycemia a few days ago.  Her long-acting insulin was held.  HbA1c was 5.5 in May.  CBGs appear to have stabilized.  Continue to monitor off of long-acting insulin for now.  Continue just SSI.     Leukopenia and mild thrombocytopenia Continue to monitor closely.  Normocytic anemia No clear evidence for overt bleeding although there was some concern for pink-colored urine.  Hemoglobin low but stable.  Patient is on Eliquis.  Hemoglobin is stable.  DVT Prophylaxis: On apixaban PUD Prophylaxis: Pepcid Code Status: DNR Family Communication: Discussed with the patient Disposition Plan: She is here from a skilled nursing facility.  She has had a setback over the last 24 hours due to aspiration.   Medications:  Scheduled:  acidophilus  1 capsule Oral Daily   albuterol  2 puff Inhalation QID   amiodarone  200 mg Oral BID   amoxicillin-clavulanate  1 tablet Oral Q12H   apixaban  5 mg Oral BID   ARIPiprazole  2 mg Oral QODAY   feeding supplement (PRO-STAT SUGAR FREE 64)  30 mL Oral BID   fluticasone furoate-vilanterol  1 puff Inhalation Daily   furosemide  80 mg Intravenous Daily   guaiFENesin  1,200 mg Oral BID   insulin aspart  0-20 Units Subcutaneous TID WC   insulin aspart  0-5 Units Subcutaneous QHS   lamoTRIgine  25 mg Oral Daily   metoprolol succinate  12.5 mg Oral Daily   polyethylene glycol  17 g Oral QODAY   pravastatin  40 mg Oral q1800   pregabalin  100 mg Oral BID   senna  2 tablet Oral QHS   sodium chloride  flush  10-40 mL Intracatheter Q12H   sodium chloride flush  3 mL Intravenous Q12H   trazodone  300 mg Oral QHS   umeclidinium bromide  1 puff Inhalation Daily   venlafaxine XR  300 mg Oral Daily   venlafaxine XR  37.5 mg Oral Daily   Continuous:  sodium chloride 10 mL/hr at 06/10/19 0044   BPZ:WCHENI chloride, acetaminophen, loperamide, oxyCODONE, sodium chloride flush   Objective:  Vital Signs  Vitals:   06/13/19 2113 06/14/19 0435 06/14/19 0452 06/14/19 0800  BP: 121/69 100/68  136/90  Pulse: 91 81  89  Resp: 18 17  15   Temp: 98.5 F (36.9 C) 97.6 F (36.4 C)    TempSrc: Oral Oral    SpO2: 94% 94%  (!) 88%  Weight:   (!) 141 kg   Height:        Intake/Output Summary (Last 24 hours) at 06/14/2019 1138 Last data filed at 06/14/2019 0600 Gross per 24 hour  Intake 2480 ml  Output 3100 ml  Net -  620 ml   Filed Weights   06/12/19 0500 06/13/19 0500 06/14/19 0452  Weight: (!) 139.5 kg (!) 139.8 kg (!) 141 kg   General appearance: Awake alert.  In no distress.  Morbidly obese Resp: Coarse breath sounds bilaterally.  Crackles right side.  Difficult exam due to body habitus.   Cardio: S1-S2 is normal regular.  No S3-S4.  No rubs murmurs or bruit GI: Abdomen is obese.  Difficult exam due to body habitus.  Nontender. Neurologic: Alert and oriented x3.  No obvious focal neurological deficits noted.   Lab Results:  Data Reviewed: I have personally reviewed following labs and imaging studies  CBC: Recent Labs  Lab 06/08/19 0515 06/09/19 0517 06/10/19 0459 06/11/19 1030 06/12/19 0637 06/13/19 0600 06/14/19 0500  WBC 2.8* 3.7* 3.9* 10.8* 7.0 5.8 6.1  NEUTROABS 2.2 3.0 3.2  --   --   --   --   HGB 10.0* 10.2* 8.3* 10.8* 9.5* 9.1* 9.2*  HCT 33.3* 34.7* 28.3* 36.8 33.6* 32.3* 33.0*  MCV 97.4 98.0 97.3 99.2 100.6* 102.2* 102.5*  PLT 85* 122* 124* 205 217 233 474    Basic Metabolic Panel: Recent Labs  Lab 06/08/19 0515 06/09/19 0517 06/10/19 0459   06/11/19 0809 06/11/19 1030 06/12/19 0637 06/13/19 0600 06/14/19 0500  NA 142 143 154*   < > QUESTIONABLE RESULTS, RECOMMEND RECOLLECT TO VERIFY 141 144 143 142  K 3.8 3.6 2.7*   < > QUESTIONABLE RESULTS, RECOMMEND RECOLLECT TO VERIFY 3.3* 3.9 4.4 3.8  CL 85* 86* 94*   < > QUESTIONABLE RESULTS, RECOMMEND RECOLLECT TO VERIFY 85* 90* 91* 86*  CO2 43* 44* 36*   < > QUESTIONABLE RESULTS, RECOMMEND RECOLLECT TO VERIFY 43* 46* 43* 45*  GLUCOSE 346* 247* 269*   < > QUESTIONABLE RESULTS, RECOMMEND RECOLLECT TO VERIFY 221* 78 124* 137*  BUN 43* 43* 40*   < > QUESTIONABLE RESULTS, RECOMMEND RECOLLECT TO VERIFY 49* 38* 32* 34*  CREATININE 0.84 0.79 1.16*   < > QUESTIONABLE RESULTS, RECOMMEND RECOLLECT TO VERIFY 0.83 0.56 0.57 0.69  CALCIUM 8.6* 9.2 6.9*   < > QUESTIONABLE RESULTS, RECOMMEND RECOLLECT TO VERIFY 9.3 9.8 9.7 9.9  MG 1.9 2.2 1.7  --  QUESTIONABLE RESULTS, RECOMMEND RECOLLECT TO VERIFY 2.3  --   --   --    < > = values in this interval not displayed.    GFR: Estimated Creatinine Clearance: 105.9 mL/min (by C-G formula based on SCr of 0.69 mg/dL).  Liver Function Tests: Recent Labs  Lab 06/08/19 0515 06/09/19 0517 06/10/19 0459 06/10/19 1120  AST 20 17 10* 20  ALT 32 32 22 31  ALKPHOS 100 100 72 101  BILITOT 0.3 0.3 0.3 0.4  PROT 6.0* 5.9* <3.0* 6.1*  ALBUMIN 2.5* 2.6* 2.0* 2.7*      Recent Results (from the past 240 hour(s))  Blood Culture (routine x 2)     Status: Abnormal   Collection Time: 06/14/2019  5:00 PM   Specimen: BLOOD  Result Value Ref Range Status   Specimen Description   Final    BLOOD LEFT ARM Performed at [facility], [place] [address], [place], [place] [address]    Special Requests   Final    BOTTLES DRAWN AEROBIC AND ANAEROBIC Blood Culture adequate volume Performed at [facility] [address], [place],  [address]    Culture  Setup Time   Final    GRAM POSITIVE COCCI AEROBIC BOTTLE ONLY CRITICAL  RESULT CALLED TO, READ BACK BY AND VERIFIED WITH: N.BATCHEOBER,PHARMD 0120 06/06/2019 M.CAMPBELL    Culture (A)  Final    STAPHYLOCOCCUS SPECIES (COAGULASE NEGATIVE) THE SIGNIFICANCE OF ISOLATING THIS ORGANISM FROM A SINGLE SET OF BLOOD CULTURES WHEN MULTIPLE SETS ARE DRAWN IS UNCERTAIN. PLEASE NOTIFY THE MICROBIOLOGY DEPARTMENT WITHIN ONE WEEK IF SPECIATION AND SENSITIVITIES ARE REQUIRED. Performed at [facility], [place] [address] [place], [place] [address]    Report Status 06/08/2019 FINAL  Final  Blood Culture ID Panel (Reflexed)     Status: Abnormal   Collection Time: 05/27/2019  5:00 PM  Result Value Ref Range Status   Enterococcus species NOT DETECTED NOT DETECTED Final   Listeria monocytogenes NOT DETECTED NOT DETECTED Final   Staphylococcus species DETECTED (A) NOT DETECTED Final    Comment: Methicillin (oxacillin) resistant coagulase negative staphylococcus. Possible blood culture contaminant (unless isolated from more than one blood culture draw or clinical case suggests pathogenicity). No antibiotic treatment is indicated for blood  culture contaminants. CRITICAL RESULT CALLED TO, READ BACK BY AND VERIFIED WITH: N.BATCHEOBER,PHARMD 0120 06/06/2019 M.CAMPBELL    Staphylococcus aureus (BCID) NOT DETECTED NOT DETECTED Final   Methicillin resistance DETECTED (A) NOT DETECTED Final    Comment: CRITICAL RESULT CALLED TO, READ BACK BY AND VERIFIED WITH: N.BATCHEOBER,PHARMD 0120 06/06/2019 M.CAMPBELL    Streptococcus species NOT DETECTED NOT DETECTED Final   Streptococcus agalactiae NOT DETECTED NOT DETECTED Final   Streptococcus pneumoniae NOT DETECTED NOT DETECTED Final   Streptococcus pyogenes NOT DETECTED NOT DETECTED Final   Acinetobacter baumannii NOT DETECTED NOT DETECTED Final   Enterobacteriaceae species NOT DETECTED NOT DETECTED Final   Enterobacter cloacae complex NOT DETECTED NOT DETECTED Final   Escherichia coli NOT DETECTED NOT DETECTED Final   Klebsiella oxytoca  NOT DETECTED NOT DETECTED Final   Klebsiella pneumoniae NOT DETECTED NOT DETECTED Final   Proteus species NOT DETECTED NOT DETECTED Final   Serratia marcescens NOT DETECTED NOT DETECTED Final   Haemophilus influenzae NOT DETECTED NOT DETECTED Final   Neisseria meningitidis NOT DETECTED NOT DETECTED Final   Pseudomonas aeruginosa NOT DETECTED NOT DETECTED Final   Candida albicans NOT DETECTED NOT DETECTED Final   Candida glabrata NOT DETECTED NOT DETECTED Final   Candida krusei NOT DETECTED NOT DETECTED Final   Candida parapsilosis NOT DETECTED NOT DETECTED Final   Candida tropicalis NOT DETECTED NOT DETECTED Final    Comment: Performed at [facility], [place] [address] [place], [place] [address]  SARS Coronavirus 2 (CEPHEID- Performed in [place] hospital lab), Hosp Order     Status: Abnormal   Collection Time: 06/11/2019  7:51 PM   Specimen: Nasopharyngeal Swab  Result Value Ref Range Status   SARS Coronavirus 2 POSITIVE (A) NEGATIVE Final    Comment: RESULT CALLED TO, READ BACK BY AND VERIFIED WITH: RHONEY A @2246  ON 06/01/2019 JACKSON,K (NOTE) If result is NEGATIVE SARS-CoV-2 target nucleic acids are NOT DETECTED. The SARS-CoV-2 RNA is generally detectable in upper and lower  respiratory specimens during the acute phase of infection. The lowest  concentration of SARS-CoV-2 viral copies this assay can detect is 250  copies / mL. A negative result does not preclude SARS-CoV-2 infection  and should not be used as the sole basis for treatment or other  patient management decisions.  A negative result may occur with  improper specimen collection / handling, submission of specimen other  than nasopharyngeal swab, presence of viral mutation(s) within the  areas targeted by this assay,  and inadequate number of viral copies  (<250 copies / mL). A negative result must be combined with clinical  observations, patient history, and epidemiological information. If result is  POSITIVE SARS-CoV-2 target nucleic acids are DETECTED.  The SARS-CoV-2 RNA is generally detectable in upper and lower  respiratory specimens during the acute phase of infection.  Positive  results are indicative of active infection with SARS-CoV-2.  Clinical  correlation with patient history and other diagnostic information is  necessary to determine patient infection status.  Positive results do  not rule out bacterial infection or co-infection with other viruses. If result is PRESUMPTIVE POSTIVE SARS-CoV-2 nucleic acids MAY BE PRESENT.   A presumptive positive result was obtained on the submitted specimen  and confirmed on repeat testing.  While 2019 novel coronavirus  (SARS-CoV-2) nucleic acids may be present in the submitted sample  additional confirmatory testing may be necessary for epidemiological  and / or clinical management purposes  to differentiate between  SARS-CoV-2 and other Sarbecovirus currently known to infect humans.  If clinically indicated additional testing with an alternate test  methodology [phone])  is advised. The SARS-CoV-2 RNA is generally  detectable in upper and lower respiratory specimens during the acute  phase of infection. The expected result is Negative. Fact Sheet for Patients:  StrictlyIdeas.no Fact Sheet for Healthcare Providers: BankingDealers.co.za This test is not yet approved or cleared by the [place] FDA and has been authorized for detection and/or diagnosis of SARS-CoV-2 by FDA under an Emergency Use Authorization (EUA).  This EUA will remain in effect (meaning this test can be used) for the duration of the COVID-19 declaration under Section 564(b)(1) of the Act, 21 U.S.C. section 360bbb-3(b)(1), unless the authorization is terminated or revoked sooner. Performed at [facility], [place] [address], [place], [place] [address]   Culture, sputum-assessment     Status: None    Collection Time: 06/05/19 12:30 PM   Specimen: Sputum  Result Value Ref Range Status   Specimen Description SPUTUM  Final   Special Requests NONE  Final   Sputum evaluation   Final    THIS SPECIMEN IS ACCEPTABLE FOR SPUTUM CULTURE Performed at [facility], [place] [address] [place], [place] [address]    Report Status 06/05/2019 FINAL  Final  Culture, respiratory     Status: None   Collection Time: 06/05/19 12:30 PM   Specimen: SPU  Result Value Ref Range Status   Specimen Description   Final    SPUTUM Performed at [facility] [address], [place], [place] [address]    Special Requests   Final    NONE Reflexed from S2110 Performed at [facility], [place] [address] [place], [place] [address]    Gram Stain   Final    FEW WBC PRESENT,BOTH PMN AND MONONUCLEAR FEW YEAST RARE GRAM POSITIVE RODS    Culture   Final    Consistent with normal respiratory flora. Performed at [facility], [place] [address] [place], [place] [address]    Report Status 06/08/2019 FINAL  Final      Radiology Studies: Dg Chest Port 1 View  Result Date: 06/14/2019 CLINICAL DATA:  Pneumonia. EXAM: PORTABLE CHEST 1 VIEW COMPARISON:  06/09/2019. FINDINGS: Patient is rotated to the right. Right PICC line noted in unchanged position. New onset of dense volume loss and consolidation right mid and upper lung. Bibasilar atelectasis. Small right pleural effusion cannot be excluded. No pneumothorax. Degenerative changes scoliosis  thoracic spine. IMPRESSION: 1. Patient is rotated to the right. Right PICC line in unchanged position. 2. New onset of dense volume loss and consolidation right mid and upper lung. Bibasilar atelectasis. Small right pleural effusion cannot be excluded. Electronically Signed   By: Marcello Moores  Register   On: 06/14/2019 06:22       LOS: 10 days   Bonnielee Haff  [facility] Pager on [email or web address]  06/14/2019, 11:38  AM

## 2019-06-14 NOTE — Progress Notes (Signed)
Occupational Therapy Treatment Patient Details Name: Kathleen Roy MRN: [id number] DOB: 1961/07/21 Today's Date: 06/14/2019    History of present illness 58 yo female admitted from SNF with increased respiratory distress, H/O morbid obesity, OSA DHF, HTN, DM,Myoclonus, fibromyalgia, depression.Positive for covid-19.   OT comments  Pt progressing towards OT goals today - required more O2 than previous session, but able to perform self-feeding at bed level with total A for bed re-positinoing - educating on self-advocacy for proper positioning. Pt able to use regular utensils but educated on benefits of AE to address fatigue. Pt then participated in HEP for BUE with level 1 theraband (see below). OT will continue to follow acutely.    Follow Up Recommendations  SNF;Supervision/Assistance - 24 hour    Equipment Recommendations  None recommended by OT    Recommendations for Other Services      Precautions / Restrictions Precautions Precautions: Other (comment) Precaution Comments: monitor sats Restrictions Weight Bearing Restrictions: No       Mobility Bed Mobility Overal bed mobility: Needs Assistance             General bed mobility comments: Total A for repositioning in bed  Transfers                      Balance                                           ADL either performed or assessed with clinical judgement   ADL Overall ADL's : Needs assistance/impaired Eating/Feeding: Bed level;Set up;Supervision/ safety Eating/Feeding Details (indicate cue type and reason): Optimizing position in bed and pt able to perform self feeding with set up using normal utensils.                                    General ADL Comments: Pt continues to present near baseline function.Focused session on self feeding as pt performs self feeding at baseline. Re-positioned in bed with more upright posture (tends to lean to the right) Positioning pillows  at right elbow to decrease tremors. Pt able to self-feed with no AE or specialized equipment - although the patient would admit that she fatigues quicker and that the built up handles assist with longevity of the task.      Vision       Perception     Praxis      Cognition Arousal/Alertness: Awake/alert Behavior During Therapy: WFL for tasks assessed/performed Overall Cognitive Status: Within Functional Limits for tasks assessed                                          Exercises Exercises: General Upper Extremity General Exercises - Upper Extremity Shoulder ABduction: AROM;Both;10 reps;Supine;Theraband Theraband Level (Shoulder Abduction): Level 1 (Yellow) Shoulder ADduction: AROM;Both;10 reps;Supine;Theraband Theraband Level (Shoulder Adduction): Level 1 (Yellow) Elbow Flexion: AROM;Both;10 reps;Supine;Theraband Theraband Level (Elbow Flexion): Level 1 (Yellow) Elbow Extension: AROM;Both;10 reps;Supine;Theraband Theraband Level (Elbow Extension): Level 1 (Yellow)   Shoulder Instructions       General Comments Pt on 10LO2 and O2 was 82-89% HR elevated to 127 and RR in the 20's    Pertinent Vitals/ Pain  Pain Assessment: Faces Faces Pain Scale: Hurts a little bit Pain Location: Back,  BLE Pain Descriptors / Indicators: Discomfort;Grimacing;Guarding Pain Intervention(s): Monitored during session;Repositioned  Home Living                                          Prior Functioning/Environment              Frequency  Min 2X/week        Progress Toward Goals  OT Goals(current goals can now be found in the care plan section)  Progress towards OT goals: Progressing toward goals  Acute Rehab OT Goals Patient Stated Goal: "Be able to feed myself" OT Goal Formulation: With patient Time For Goal Achievement: 06/22/19 Potential to Achieve Goals: Good ADL Goals Pt Will Perform Eating: with modified independence;with  adaptive utensils;bed level;with assist to don/doff brace/orthosis Pt Will Perform Grooming: with modified independence;bed level;with adaptive equipment Pt/caregiver will Perform Home Exercise Program: Increased ROM;Increased strength;Both right and left upper extremity;With Supervision;With written HEP provided Additional ADL Goal #1: Pt will perform rolling in bed with Max A to participate in ADLs at bed level  Plan Discharge plan remains appropriate    Co-evaluation                 AM-PAC OT "6 Clicks" Daily Activity     Outcome Measure   Help from another person eating meals?: A Little Help from another person taking care of personal grooming?: A Little Help from another person toileting, which includes using toliet, bedpan, or urinal?: Total Help from another person bathing (including washing, rinsing, drying)?: Total Help from another person to put on and taking off regular upper body clothing?: A Lot Help from another person to put on and taking off regular lower body clothing?: Total 6 Click Score: 11    End of Session Equipment Utilized During Treatment: Oxygen(10L)  OT Visit Diagnosis: Unsteadiness on feet (R26.81);Other abnormalities of gait and mobility (R26.89);Muscle weakness (generalized) (M62.81);Pain Pain - Right/Left: (Bilateral) Pain - part of body: Leg(Back; rectum)   Activity Tolerance Patient tolerated treatment well   Patient Left in bed;with call bell/phone within reach   Nurse Communication Mobility status(wanting diet coke and IV infusion complete)        Time: 3888-7579 OT Time Calculation (min): 26 min  Charges: OT General Charges $OT Visit: 1 Visit OT Treatments $Self Care/Home Management : 8-22 mins $Therapeutic Exercise: 8-22 mins  Hulda Humphrey OTR/L Acute Rehabilitation Services Pager: [phone] Office: [place] 06/14/2019, 4:46 PM

## 2019-06-15 ENCOUNTER — Inpatient Hospital Stay (HOSPITAL_COMMUNITY): Payer: Medicare Other

## 2019-06-15 LAB — POCT I-STAT 7, (LYTES, BLD GAS, ICA,H+H)
Acid-Base Excess: 29 mmol/L — ABNORMAL HIGH (ref 0.0–2.0)
Bicarbonate: 54.3 mmol/L — ABNORMAL HIGH (ref 20.0–28.0)
Calcium, Ion: 1.28 mmol/L (ref 1.15–1.40)
HCT: 31 % — ABNORMAL LOW (ref 36.0–46.0)
Hemoglobin: 10.5 g/dL — ABNORMAL LOW (ref 12.0–15.0)
O2 Saturation: 88 %
Potassium: 4.9 mmol/L (ref 3.5–5.1)
Sodium: 135 mmol/L (ref 135–145)
TCO2: >50 mmol/L — ABNORMAL HIGH (ref 22–32)
pCO2 arterial: 54.6 mmHg — ABNORMAL HIGH (ref 32.0–48.0)
pH, Arterial: 7.605 (ref 7.350–7.450)
pO2, Arterial: 48 mmHg — ABNORMAL LOW (ref 83.0–108.0)

## 2019-06-15 LAB — CBC
HCT: 33.7 % — ABNORMAL LOW (ref 36.0–46.0)
Hemoglobin: 9.7 g/dL — ABNORMAL LOW (ref 12.0–15.0)
MCH: 29.7 pg (ref 26.0–34.0)
MCHC: 28.8 g/dL — ABNORMAL LOW (ref 30.0–36.0)
MCV: 103.1 fL — ABNORMAL HIGH (ref 80.0–100.0)
Platelets: 287 10*3/uL (ref 150–400)
RBC: 3.27 MIL/uL — ABNORMAL LOW (ref 3.87–5.11)
RDW: 15.7 % — ABNORMAL HIGH (ref 11.5–15.5)
WBC: 7.5 10*3/uL (ref 4.0–10.5)
nRBC: 0.4 % — ABNORMAL HIGH (ref 0.0–0.2)

## 2019-06-15 LAB — BASIC METABOLIC PANEL
Anion gap: 9 (ref 5–15)
BUN: 31 mg/dL — ABNORMAL HIGH (ref 6–20)
CO2: 46 mmol/L — ABNORMAL HIGH (ref 22–32)
Calcium: 10 mg/dL (ref 8.9–10.3)
Chloride: 87 mmol/L — ABNORMAL LOW (ref 98–111)
Creatinine, Ser: 0.62 mg/dL (ref 0.44–1.00)
GFR calc Af Amer: >60 mL/min (ref >60)
GFR calc non Af Amer: >60 mL/min (ref >60)
Glucose, Bld: 158 mg/dL — ABNORMAL HIGH (ref 70–99)
Potassium: 4.4 mmol/L (ref 3.5–5.1)
Sodium: 142 mmol/L (ref 135–145)

## 2019-06-15 LAB — GLUCOSE, CAPILLARY
Glucose-Capillary: 136 mg/dL — ABNORMAL HIGH (ref 70–99)
Glucose-Capillary: 232 mg/dL — ABNORMAL HIGH (ref 70–99)
Glucose-Capillary: 120 mg/dL — ABNORMAL HIGH (ref 70–99)
Glucose-Capillary: 144 mg/dL — ABNORMAL HIGH (ref 70–99)

## 2019-06-15 NOTE — Progress Notes (Addendum)
PROGRESS NOTE  Kathleen Roy FWY:[id number] DOB: 10/31/1961 DOA: 05/28/2019  PCP: [name], MD  Brief History/Interval Summary: 58 y.o.femalewith medical history significant forCOPD, chronic respiratory failure, type 2 diabetes mellitus, hypertension, obstructive sleep apnea, and chronic diastolic CHF,  presenting to the emergency department from a nursing facility for evaluation of worsening lethargy and increased supplemental oxygen requirement., Was COVID-19 positive, as well work-up significant for aspiration pneumonia, worsening respiratory failure, she developed A. fib with RVR 06/07/2019.   Reason for Visit: Acute respiratory failure with hypoxia  Consultants: None  Procedures: PICC line in right upper extremity  Antibiotics: Unasyn was changed over to Augmentin  Subjective/Interval History: Patient was noted to be hypoxic this morning she remains on 15 L of oxygen by high flow nasal cannula.  Currently saturating 100%.  Patient states that she feels well.  She denies any more short of breath than she was over the last few days.  Does complain of continuous dry cough.  She is still noted to be almost flat when she is eating and drinking.    Assessment/Plan:  Aspiration pneumonia/sepsis present on admission Patient was initially on ceftriaxone azithromycin and vancomycin.  She was switched over to Unasyn and then to Augmentin.  Patient had worsening oxygenation 2 days ago and a chest x-ray was repeated which showed increase in the consolidation in the right lung.  All of this is secondary to aspiration as the patient is almost flat.  She eats and drinks when she is flat like this.  Discussed with nursing staff in detail yesterday as well as today.  I have asked them to keep the patient as upright as possible.  Flutter valve, incentive spirometry.  Mucinex.  Unfortunately cannot do chest physiotherapy due to her COVID-19 status.  Continue Augmentin for now.  Chest x-ray today  does show improvement in the aeration in the upper right lung but worsened in the lower right lung.  This is a difficult situation.  Patient has very poor quality of life at baseline.  She is not really ambulatory.  She is morbidly obese.  Likely not a candidate for any kind of surgical intervention or procedures.  We will discuss with pulmonology regarding her x-ray findings.  CT scan was considered however unclear if doing a CT will change management in any way.  ABG from this morning reviewed.    Discussed with Dr. Lake Bells with pulmonology.  Patient has lung collapse due to aspiration.  No new recommendations except as noted above.  Continue management as outlined above.  Patient was seen by speech therapy on June 21.  At that time she was not having any aspiration.  It appears that her aspiration is mainly due to body position rather than an oropharyngeal issue.  If we are not able to optimally position her for safe swallowing then we may have to make her n.p.o. and consider a feeding tube.  Acute respiratory disease due to Acute Covid 19 Viral Illness Patient had elevated inflammatory markers.  She had high oxygen requirements.  She was started on Remdesivir and IV steroids.  She completed 5-day course of Remdesivir.  She was also noted to have elevated procalcitonin and there was concern for aspiration pneumonia.  As a result she was not given Actemra.  Her worsening oxygenation over the last 48 hours is due to aspiration pneumonia.  Acute on chronic hypoxic respiratory failure/hypercapnic respiratory failure At baseline she is on 4 L of oxygen by nasal cannula.  She was  requiring high amounts of oxygen during earlier part of this hospitalization.  She was weaned down to 5 L of oxygen by nasal cannula.  However her oxygenation has worsened due to aspiration as discussed above.   Atrial fibrillation with RVR Patient went into atrial fibrillation with RVR on 6/22.  Initially on Cardizem infusion.   And she was placed on amiodarone infusion.  She was placed on Eliquis.  Chads 2 vascular score is 4.  She converted to sinus rhythm.  She is now on oral amiodarone.  Stable for the most part.  History of COPD with acute exacerbation Continue inhalers.  Acute on chronic diastolic CHF Thought to have significant volume overload even though BNP was normal.  Patient takes torsemide 40 mg twice daily at home.  He was diuretics here in the hospital with which she diuresed well.  Her weight is down to 138 kg from 145.2 kg.  She also has chronic CO2 retention.  She was also given acetazolamide during earlier part of this hospitalization.   Hypokalemia Resolved.  Being repleted periodically.  Essential hypertension Blood pressure is stable.  Metoprolol was discontinued during earlier part of this hospitalization due to low blood pressures.    Acute metabolic encephalopathy Most likely due to acute illness.  Seems to be back to her baseline now.  Morbid obesity BMI 58.53  Diabetes mellitus type 2 with episodes of hypoglycemia Patient was on high doses of long-acting insulin.  She had episodes of hypoglycemia a few days ago.  Her long-acting insulin was held.  HbA1c was 5.5 in May.  ABGs have stabilized.  Continue to monitor off of long-acting insulin.  Continue just SSI.  C  Leukopenia and mild thrombocytopenia This has resolved.  Normocytic anemia Patient's overt bleeding.  Hemoglobin is stable.    DVT Prophylaxis: On apixaban PUD Prophylaxis: Pepcid Code Status: DNR Family Communication: Discussed with the patient Disposition Plan: She is here from a skilled nursing facility.  She has had a setback over the last 48 hours due to aspiration.  Medications:  Scheduled: . acidophilus  1 capsule Oral Daily  . albuterol  2 puff Inhalation QID  . amiodarone  200 mg Oral BID  . amoxicillin-clavulanate  1 tablet Oral Q12H  . apixaban  5 mg Oral BID  . ARIPiprazole  2 mg Oral QODAY  . feeding  supplement (PRO-STAT SUGAR FREE 64)  30 mL Oral BID  . fluticasone furoate-vilanterol  1 puff Inhalation Daily  . furosemide  80 mg Intravenous Daily  . guaiFENesin  1,200 mg Oral BID  . insulin aspart  0-20 Units Subcutaneous TID WC  . insulin aspart  0-5 Units Subcutaneous QHS  . lamoTRIgine  25 mg Oral Daily  . metoprolol succinate  12.5 mg Oral Daily  . polyethylene glycol  17 g Oral QODAY  . pravastatin  40 mg Oral q1800  . pregabalin  100 mg Oral BID  . senna  2 tablet Oral QHS  . sodium chloride flush  10-40 mL Intracatheter Q12H  . sodium chloride flush  3 mL Intravenous Q12H  . trazodone  300 mg Oral QHS  . umeclidinium bromide  1 puff Inhalation Daily  . venlafaxine XR  300 mg Oral Daily  . venlafaxine XR  37.5 mg Oral Daily   Continuous: . sodium chloride 10 mL/hr at 06/10/19 0044   GXQ:JJHERD chloride, acetaminophen, loperamide, oxyCODONE, sodium chloride flush   Objective:  Vital Signs  Vitals:   06/14/19 1949 06/15/19 0500 06/15/19  0602 06/15/19 0800  BP: (!) 111/52  (!) 94/53 120/87  Pulse: 95  81 95  Resp: 19  16 15   Temp: 98.9 F (37.2 C)  98.3 F (36.8 C) 97.6 F (36.4 C)  TempSrc: Oral  Oral Axillary  SpO2: 93%  92% (!) 68%  Weight:  (!) 138 kg    Height:        Intake/Output Summary (Last 24 hours) at 06/15/2019 1100 Last data filed at 06/15/2019 0600 Gross per 24 hour  Intake 670 ml  Output 2000 ml  Net -1330 ml   Filed Weights   06/13/19 0500 06/14/19 0452 06/15/19 0500  Weight: (!) 139.8 kg (!) 141 kg (!) 138 kg    General appearance: Awake alert.  In no distress.  Morbidly obese Resp: Diminished air entry on the right.  Crackles present.  No wheezing.  Difficult exam due to body habitus.   Cardio: S1-S2 is normal regular.  No S3-S4.  No rubs murmurs or bruit.  Telemetry shows sinus rhythm GI: Abdomen is obese.  Difficult exam due to body habitus.  Nontender. Extremities: Changes due to chronic venous stasis noted. Neurologic: No  obvious focal neurological deficits.   Lab Results:  Data Reviewed: I have personally reviewed following labs and imaging studies  CBC: Recent Labs  Lab 06/09/19 0517 06/10/19 0459 06/11/19 1030 06/12/19 0637 06/13/19 0600 06/14/19 0500 06/15/19 0625 06/15/19 0738  WBC 3.7* 3.9* 10.8* 7.0 5.8 6.1 7.5  --   NEUTROABS 3.0 3.2  --   --   --   --   --   --   HGB 10.2* 8.3* 10.8* 9.5* 9.1* 9.2* 9.7* 10.5*  HCT 34.7* 28.3* 36.8 33.6* 32.3* 33.0* 33.7* 31.0*  MCV 98.0 97.3 99.2 100.6* 102.2* 102.5* 103.1*  --   PLT 122* 124* 205 217 233 269 287  --     Basic Metabolic Panel: Recent Labs  Lab 06/09/19 0517 06/10/19 0459  06/11/19 0809 06/11/19 1030 06/12/19 0637 06/13/19 0600 06/14/19 0500 06/15/19 0625 06/15/19 0738  NA 143 154*   < > QUESTIONABLE RESULTS, RECOMMEND RECOLLECT TO VERIFY 141 144 143 142 142 135  K 3.6 2.7*   < > QUESTIONABLE RESULTS, RECOMMEND RECOLLECT TO VERIFY 3.3* 3.9 4.4 3.8 4.4 4.9  CL 86* 94*   < > QUESTIONABLE RESULTS, RECOMMEND RECOLLECT TO VERIFY 85* 90* 91* 86* 87*  --   CO2 44* 36*   < > QUESTIONABLE RESULTS, RECOMMEND RECOLLECT TO VERIFY 43* 46* 43* 45* 46*  --   GLUCOSE 247* 269*   < > QUESTIONABLE RESULTS, RECOMMEND RECOLLECT TO VERIFY 221* 78 124* 137* 158*  --   BUN 43* 40*   < > QUESTIONABLE RESULTS, RECOMMEND RECOLLECT TO VERIFY 49* 38* 32* 34* 31*  --   CREATININE 0.79 1.16*   < > QUESTIONABLE RESULTS, RECOMMEND RECOLLECT TO VERIFY 0.83 0.56 0.57 0.69 0.62  --   CALCIUM 9.2 6.9*   < > QUESTIONABLE RESULTS, RECOMMEND RECOLLECT TO VERIFY 9.3 9.8 9.7 9.9 10.0  --   MG 2.2 1.7  --  QUESTIONABLE RESULTS, RECOMMEND RECOLLECT TO VERIFY 2.3  --   --   --   --   --    < > = values in this interval not displayed.    GFR: Estimated Creatinine Clearance: 104.5 mL/min (by C-G formula based on SCr of 0.62 mg/dL).  Liver Function Tests: Recent Labs  Lab 06/09/19 0517 06/10/19 0459 06/10/19 1120  AST 17 10* 20  ALT  32 22 31  ALKPHOS 100 72 101   BILITOT 0.3 0.3 0.4  PROT 5.9* <3.0* 6.1*  ALBUMIN 2.6* 2.0* 2.7*      Recent Results (from the past 240 hour(s))  Culture, sputum-assessment     Status: None   Collection Time: 06/05/19 12:30 PM   Specimen: Sputum  Result Value Ref Range Status   Specimen Description SPUTUM  Final   Special Requests NONE  Final   Sputum evaluation   Final    THIS SPECIMEN IS ACCEPTABLE FOR SPUTUM CULTURE Performed at [facility], [place] [address] [place], [place] [address]    Report Status 06/05/2019 FINAL  Final  Culture, respiratory     Status: None   Collection Time: 06/05/19 12:30 PM   Specimen: SPU  Result Value Ref Range Status   Specimen Description   Final    SPUTUM Performed at [facility] [address], [place], [place] [address]    Special Requests   Final    NONE Reflexed from S2110 Performed at [facility], [place] [address] [place], [place] [address]    Gram Stain   Final    FEW WBC PRESENT,BOTH PMN AND MONONUCLEAR FEW YEAST RARE GRAM POSITIVE RODS    Culture   Final    Consistent with normal respiratory flora. Performed at [facility], [place] [address], [place], [place] [address]    Report Status 06/08/2019 FINAL  Final      Radiology Studies: Dg Chest Port 1 View  Result Date: 06/15/2019 CLINICAL DATA:  Follow-up aspiration of EXAM: PORTABLE CHEST 1 VIEW COMPARISON:  06/14/2019 FINDINGS: Cardiac shadow is stable. Right-sided PICC line is again noted in the proximal superior vena cava. Patient rotation is again identified. There is been some interval clearing in the right upper lobe when compared with the prior exam although some new consolidation in the right base is seen. No bony abnormality is seen. The left lung shows mild basilar atelectasis. IMPRESSION: Improved aeration in the right upper lobe with decreased aeration in the right lower lobe. Electronically Signed   By: Inez Catalina M.D.   On:  06/15/2019 07:13   Dg Chest Port 1 View  Result Date: 06/14/2019 CLINICAL DATA:  Pneumonia. EXAM: PORTABLE CHEST 1 VIEW COMPARISON:  06/09/2019. FINDINGS: Patient is rotated to the right. Right PICC line noted in unchanged position. New onset of dense volume loss and consolidation right mid and upper lung. Bibasilar atelectasis. Small right pleural effusion cannot be excluded. No pneumothorax. Degenerative changes scoliosis thoracic spine. IMPRESSION: 1. Patient is rotated to the right. Right PICC line in unchanged position. 2. New onset of dense volume loss and consolidation right mid and upper lung. Bibasilar atelectasis. Small right pleural effusion cannot be excluded. Electronically Signed   By: Pierce   On: 06/14/2019 06:22       LOS: 21 days   [facility] Pager on [email or web address]  06/15/2019, 11:00 AM

## 2019-06-15 NOTE — Progress Notes (Signed)
Pt desat this am into 69s. Increased high flow to 15L with nonrebreather, O2 sats increased to 83- 92.Marland Kitchen

## 2019-06-15 NOTE — Progress Notes (Signed)
Updated patients best friend on patients status and plan of care. All questions answered at this time

## 2019-06-15 NOTE — Progress Notes (Signed)
Crirical ABG results given to Dr [place] Pink. Pt on NRB, no distress, no increased WOB, pt sleeping but woke to ask for Diet Coke when this RT told her what she was going to do.  PH 7.60 PCO2 54.6 PaO2 48 HCO3 54.3 TCO2 >50 spo2 88%

## 2019-06-16 ENCOUNTER — Inpatient Hospital Stay (HOSPITAL_COMMUNITY): Payer: Medicare Other

## 2019-06-16 LAB — CBC
HCT: 32.1 % — ABNORMAL LOW (ref 36.0–46.0)
Hemoglobin: 9.2 g/dL — ABNORMAL LOW (ref 12.0–15.0)
MCH: 29.9 pg (ref 26.0–34.0)
MCHC: 28.7 g/dL — ABNORMAL LOW (ref 30.0–36.0)
MCV: 104.2 fL — ABNORMAL HIGH (ref 80.0–100.0)
Platelets: 265 10*3/uL (ref 150–400)
RBC: 3.08 MIL/uL — ABNORMAL LOW (ref 3.87–5.11)
RDW: 16.2 % — ABNORMAL HIGH (ref 11.5–15.5)
WBC: 6.6 10*3/uL (ref 4.0–10.5)
nRBC: 0 % (ref 0.0–0.2)

## 2019-06-16 LAB — MRSA PCR SCREENING: MRSA by PCR: POSITIVE — AB

## 2019-06-16 LAB — BASIC METABOLIC PANEL
Anion gap: 10 (ref 5–15)
BUN: 21 mg/dL — ABNORMAL HIGH (ref 6–20)
CO2: 45 mmol/L — ABNORMAL HIGH (ref 22–32)
Calcium: 9.2 mg/dL (ref 8.9–10.3)
Chloride: 88 mmol/L — ABNORMAL LOW (ref 98–111)
Creatinine, Ser: 0.5 mg/dL (ref 0.44–1.00)
GFR calc Af Amer: >60 mL/min (ref >60)
GFR calc non Af Amer: >60 mL/min (ref >60)
Glucose, Bld: 102 mg/dL — ABNORMAL HIGH (ref 70–99)
Potassium: 3.5 mmol/L (ref 3.5–5.1)
Sodium: 143 mmol/L (ref 135–145)

## 2019-06-16 LAB — GLUCOSE, CAPILLARY
Glucose-Capillary: 140 mg/dL — ABNORMAL HIGH (ref 70–99)
Glucose-Capillary: 130 mg/dL — ABNORMAL HIGH (ref 70–99)
Glucose-Capillary: 105 mg/dL — ABNORMAL HIGH (ref 70–99)
Glucose-Capillary: 178 mg/dL — ABNORMAL HIGH (ref 70–99)

## 2019-06-16 LAB — PHOSPHORUS: Phosphorus: 3.6 mg/dL (ref 2.5–4.6)

## 2019-06-16 LAB — MAGNESIUM: Magnesium: 2.1 mg/dL (ref 1.7–2.4)

## 2019-06-16 MED ORDER — TRAZODONE HCL 50 MG PO TABS
300.0000 mg | ORAL_TABLET | Freq: Every day | ORAL | Status: DC
  Administered 2019-06-19: 300 mg
  Filled 2019-06-16: qty 6

## 2019-06-16 MED ORDER — AMOXICILLIN-POT CLAVULANATE 400-57 MG/5ML PO SUSR
875.0000 mg | Freq: Two times a day (BID) | ORAL | Status: AC
  Administered 2019-06-16 – 2019-06-17 (×3): 875 mg
  Filled 2019-06-16 (×4): qty 10.9

## 2019-06-16 MED ORDER — PREGABALIN 50 MG PO CAPS
100.0000 mg | ORAL_CAPSULE | Freq: Two times a day (BID) | ORAL | Status: DC
  Administered 2019-06-16 – 2019-06-19 (×5): 100 mg
  Filled 2019-06-16 (×5): qty 2

## 2019-06-16 MED ORDER — AMOXICILLIN-POT CLAVULANATE 600-42.9 MG/5ML PO SUSR: 875.0000 mg | Freq: Two times a day (BID) | ORAL | Status: DC

## 2019-06-16 MED ORDER — LAMOTRIGINE 25 MG PO TABS
25.0000 mg | ORAL_TABLET | Freq: Every day | ORAL | Status: DC
  Administered 2019-06-17 – 2019-06-19 (×3): 25 mg
  Filled 2019-06-16 (×4): qty 1

## 2019-06-16 MED ORDER — AMIODARONE HCL 200 MG PO TABS
200.0000 mg | ORAL_TABLET | Freq: Two times a day (BID) | ORAL | Status: DC
  Administered 2019-06-16: 21:00:00 200 mg
  Filled 2019-06-16 (×2): qty 1

## 2019-06-16 MED ORDER — APIXABAN 5 MG PO TABS
5.0000 mg | ORAL_TABLET | Freq: Two times a day (BID) | ORAL | Status: DC
  Administered 2019-06-16 – 2019-06-19 (×7): 5 mg
  Filled 2019-06-16 (×7): qty 1

## 2019-06-16 MED ORDER — METOPROLOL TARTRATE 25 MG/10 ML ORAL SUSPENSION
6.2500 mg | Freq: Two times a day (BID) | ORAL | Status: DC
  Administered 2019-06-17 – 2019-06-19 (×4): 6.25 mg
  Filled 2019-06-16 (×7): qty 5

## 2019-06-16 MED ORDER — VITAL 1.5 CAL PO LIQD
1000.0000 mL | ORAL | Status: DC
  Administered 2019-06-16 – 2019-06-18 (×3): 1000 mL
  Filled 2019-06-16 (×6): qty 1000

## 2019-06-16 MED ORDER — POLYETHYLENE GLYCOL 3350 17 G PO PACK
17.0000 g | PACK | ORAL | Status: DC
  Administered 2019-06-17: 17 g
  Filled 2019-06-16: qty 1

## 2019-06-16 MED ORDER — GUAIFENESIN 100 MG/5ML PO SOLN
5.0000 mL | Freq: Three times a day (TID) | ORAL | Status: DC
  Administered 2019-06-16 – 2019-06-19 (×11): 100 mg
  Filled 2019-06-16 (×11): qty 15

## 2019-06-16 MED ORDER — ALBUTEROL SULFATE HFA 108 (90 BASE) MCG/ACT IN AERS
1.0000 | INHALATION_SPRAY | RESPIRATORY_TRACT | Status: DC | PRN
  Filled 2019-06-16: qty 6.7

## 2019-06-16 MED ORDER — PRAVASTATIN SODIUM 40 MG PO TABS
40.0000 mg | ORAL_TABLET | Freq: Every day | ORAL | Status: DC
  Administered 2019-06-16 – 2019-06-19 (×4): 40 mg
  Filled 2019-06-16: qty 4
  Filled 2019-06-16 (×4): qty 1

## 2019-06-16 MED ORDER — SENNA 8.6 MG PO TABS
2.0000 | ORAL_TABLET | Freq: Every day | ORAL | Status: DC
  Administered 2019-06-17 – 2019-06-19 (×2): 17.2 mg
  Filled 2019-06-16 (×2): qty 2

## 2019-06-16 MED ORDER — PRO-STAT SUGAR FREE PO LIQD
30.0000 mL | Freq: Three times a day (TID) | ORAL | Status: DC
  Administered 2019-06-16 – 2019-06-19 (×11): 30 mL
  Filled 2019-06-16 (×11): qty 30

## 2019-06-16 MED ORDER — OXYCODONE HCL 5 MG PO TABS
5.0000 mg | ORAL_TABLET | Freq: Four times a day (QID) | ORAL | Status: DC | PRN
  Administered 2019-06-17 – 2019-06-19 (×5): 5 mg
  Filled 2019-06-16 (×6): qty 1

## 2019-06-16 MED ORDER — VENLAFAXINE HCL 75 MG PO TABS
187.5000 mg | ORAL_TABLET | Freq: Every day | ORAL | Status: DC
  Administered 2019-06-17 – 2019-06-19 (×3): 187.5 mg
  Filled 2019-06-16 (×4): qty 1

## 2019-06-16 MED ORDER — FUROSEMIDE 10 MG/ML IJ SOLN
80.0000 mg | Freq: Two times a day (BID) | INTRAMUSCULAR | Status: DC
  Administered 2019-06-16 – 2019-06-17 (×2): 80 mg via INTRAVENOUS
  Filled 2019-06-16 (×3): qty 8

## 2019-06-16 MED ORDER — VENLAFAXINE HCL 75 MG PO TABS
150.0000 mg | ORAL_TABLET | Freq: Every day | ORAL | Status: DC
  Administered 2019-06-16 – 2019-06-19 (×4): 150 mg
  Filled 2019-06-16 (×4): qty 2

## 2019-06-16 MED ORDER — ACETAMINOPHEN 160 MG/5ML PO SOLN
650.0000 mg | Freq: Four times a day (QID) | ORAL | Status: DC | PRN
  Administered 2019-06-17 – 2019-06-19 (×2): 650 mg
  Filled 2019-06-16 (×2): qty 20.3

## 2019-06-16 MED ORDER — ARIPIPRAZOLE 2 MG PO TABS
2.0000 mg | ORAL_TABLET | ORAL | Status: DC
  Administered 2019-06-18: 2 mg
  Filled 2019-06-16 (×2): qty 1

## 2019-06-16 MED ORDER — AMOXICILLIN-POT CLAVULANATE 875-125 MG PO TABS: 1.0000 | ORAL_TABLET | Freq: Two times a day (BID) | ORAL | Status: DC

## 2019-06-16 NOTE — Progress Notes (Signed)
 Initial Nutrition Assessment   RD working remotely.   DOCUMENTATION CODES:   Morbid obesity  INTERVENTION:   Once Cortrak placed, initiate TF:  Vital 1.5 at 55 ml/hr Pro-Stat 30 mL TID Provides 134 g of protein, 2280 kcals and 1003 mL of free water Meets 100% estimated calorie, protein needs   NUTRITION DIAGNOSIS:   Inadequate oral intake related to other (see comment)(aspiration) as evidenced by NPO status.  GOAL:   Patient will meet greater than or equal to 90% of their needs  MONITOR:   TF tolerance, Diet advancement, Labs, Weight trends, Skin  REASON FOR ASSESSMENT:   LOS    ASSESSMENT:   58 yo female admitted with aspiration pneumonia and sepsis, lung collapse due to aspiration, acute on chronic respiratory failure with COPD and acute COVID19 viral illness. PMH includes DM, COPD on 4L at baseline, HTN, CHF  Per MD notes, aspiration is apparently positional, pt eats and drinks lying flat. Pt evaluated by SLP on 6/21 and pt does not appear to have oropharyngeal issue  Currently NPO since yesterday; recorded po intake 53% prior to NPO status  Discussed nutrition poc with Attending MD; plan for Cortrak tube today with initiation of TF  Weight overall stable since admission; current wt 137.8 kg; admission weight 137.9 kg.   Pt with 1+ generalized edema with 2+ edema in b/l LE; no pressure injuries noted  Labs: CBGs 120-232 Meds: lasix, ss novolog  NUTRITION - FOCUSED PHYSICAL EXAM:  Unable to assess  Diet Order:   Diet Order            Diet NPO time specified  Diet effective now              EDUCATION NEEDS:   Not appropriate for education at this time  Skin:  Skin Assessment: Skin Integrity Issues: Skin Integrity Issues:: Other (Comment)(no pressure injuries noted per RN assessment) Other: MASD: bilateral breast, buttock, perineum  Last BM:  6/29  Height:   Ht Readings from Last 1 Encounters:  06/14/2019 5\' 2"  (1.575 m)    Weight:    Wt Readings from Last 1 Encounters:  06/16/19 (!) 137.8 kg    Ideal Body Weight:  50 kg  BMI:  Body mass index is 55.56 kg/m.  Estimated Nutritional Needs:   Kcal:  2000-2300 kcals  Protein:  120-140 g  Fluid:  >/= 1.8 L    Audrey Eller MS, RDN, LDN, CNSC [phone] Pager  [phone] Weekend/On-Call Pager

## 2019-06-16 NOTE — Progress Notes (Signed)
[facility] TEAM 1 - Stepdown/ICU TEAM  Kathleen Roy  VWU:[id number] DOB: 1961-09-22 DOA: 06/03/2019 PCP: [name], MD    Brief Narrative:  58yo SNF resident with a hx of COPD, chronic hypoxic respiratory failure, DM2, hypertension, obstructive sleep apnea, and chronic diastolic CHF who presetned to the ED w/ worsening lethargy and increased supplemental oxygen requirement. She was found to be COVID-19 positive, and also suffering from aspiration pneumonia. She developed A. fib with RVR 06/07/2019.  Significant Events: 5/29 > 6/7 admit to [facility] w/ resp failure - SARS-CoV2 negative x3 6/19 admit via [facility] 6/20 transfer to [facility] from ED   COVID-19 specific Treatment: Remdesivir 6/20 > 6/24 Steroids  Subjective: The patient is requiring increasingly higher levels of oxygen support at the time of my exam is dependent upon nonrebreather mask.  Despite this her saturations remain in the low 80s.  She is tolerating this quite well however.  She is able to converse with me.  She denies feeling severely short of breath.  She denies chest pain nausea or vomiting.  It is proven nearly impossible to position the patient upward in bed due to her body habitus.  Assessment & Plan:  Aspiration pneumonia with lung collapse Eats and drinks and fully supine position -body habitus prevents adequate positioning -maintain n.p.o. status  COVID Pneumonia Has completed treatment with Remdesivir -suspect aspiration is to blame for most of her acute issues at this time  Acute on chronic hypoxic respiratory failure -hypercapnic respiratory failure Baseline 4 L oxygen dependence -much worse now likely related to aspiration -keeping n.p.o. -wean oxygen support as able  Newly diagnosed atrial fibrillation with RVR First noted 6/22 - NSR at time of exam today  COPD Well compensated at this time  Acute on chronic diastolic CHF Diurese as able  HTN Reasonably controlled at present  Acute metabolic  encephalopathy Patient is alert and oriented at time of my exam today  Morbid obesity - Body mass index is 55.56 kg/m.  DM2 with transient hypoglycemia CBG stable at this time  Normocytic anemia  DVT prophylaxis: apixaban Code Status: DNR - NO CODE Family Communication:  Disposition Plan:   Consultants:  none  Antimicrobials:  Augmentin 6/26 >  Objective: Blood pressure (!) 153/139, pulse 86, temperature (!) 97.3 F (36.3 C), temperature source Axillary, resp. rate 20, height 5\' 2"  (1.575 m), weight (!) 137.8 kg, SpO2 (!) 80 %.  Intake/Output Summary (Last 24 hours) at 06/16/2019 1042 Last data filed at 06/16/2019 0900 Gross per 24 hour  Intake 2290 ml  Output 1200 ml  Net 1090 ml   Filed Weights   06/14/19 0452 06/15/19 0500 06/16/19 0500  Weight: (!) 141 kg (!) 138 kg (!) 137.8 kg    Examination: General: Hypoxic on monitor but alert and conversant with comfortable appearing respirations Lungs: Very distant breath sounds in all fields with no wheezing and fine crackles diffusely Cardiovascular: Regular rate and rhythm without murmur gallop or rub normal S1 and S2 Abdomen: Morbidly obese, soft, no rebound, no masses appreciable Extremities: 1+ bilateral lower extremity edema  CBC: Recent Labs  Lab 06/10/19 0459  06/13/19 0600 06/14/19 0500 06/15/19 0625 06/15/19 0738  WBC 3.9*   < > 5.8 6.1 7.5  --   NEUTROABS 3.2  --   --   --   --   --   HGB 8.3*   < > 9.1* 9.2* 9.7* 10.5*  HCT 28.3*   < > 32.3* 33.0* 33.7* 31.0*  MCV 97.3   < > 102.2* 102.5* 103.1*  --   PLT 124*   < > 233 269 287  --    < > = values in this interval not displayed.   Basic Metabolic Panel: Recent Labs  Lab 06/10/19 0459  06/11/19 0809 06/11/19 1030  06/13/19 0600 06/14/19 0500 06/15/19 0625 06/15/19 0738  NA 154*   < > QUESTIONABLE RESULTS, RECOMMEND RECOLLECT TO VERIFY 141   < > 143 142 142 135  K 2.7*   < > QUESTIONABLE RESULTS, RECOMMEND RECOLLECT TO VERIFY 3.3*   < > 4.4 3.8  4.4 4.9  CL 94*   < > QUESTIONABLE RESULTS, RECOMMEND RECOLLECT TO VERIFY 85*   < > 91* 86* 87*  --   CO2 36*   < > QUESTIONABLE RESULTS, RECOMMEND RECOLLECT TO VERIFY 43*   < > 43* 45* 46*  --   GLUCOSE 269*   < > QUESTIONABLE RESULTS, RECOMMEND RECOLLECT TO VERIFY 221*   < > 124* 137* 158*  --   BUN 40*   < > QUESTIONABLE RESULTS, RECOMMEND RECOLLECT TO VERIFY 49*   < > 32* 34* 31*  --   CREATININE 1.16*   < > QUESTIONABLE RESULTS, RECOMMEND RECOLLECT TO VERIFY 0.83   < > 0.57 0.69 0.62  --   CALCIUM 6.9*   < > QUESTIONABLE RESULTS, RECOMMEND RECOLLECT TO VERIFY 9.3   < > 9.7 9.9 10.0  --   MG 1.7  --  QUESTIONABLE RESULTS, RECOMMEND RECOLLECT TO VERIFY 2.3  --   --   --   --   --    < > = values in this interval not displayed.   GFR: Estimated Creatinine Clearance: 104.4 mL/min (by C-G formula based on SCr of 0.62 mg/dL).  Liver Function Tests: Recent Labs  Lab 06/10/19 0459 06/10/19 1120  AST 10* 20  ALT 22 31  ALKPHOS 72 101  BILITOT 0.3 0.4  PROT <3.0* 6.1*  ALBUMIN 2.0* 2.7*    HbA1C: Hgb A1c MFr Bld  Date/Time Value Ref Range Status  05/16/2019 02:21 AM 5.5 4.8 - 5.6 % Final    Comment:    (NOTE) Pre diabetes:          5.7%-6.4% Diabetes:              >6.4% Glycemic control for   <7.0% adults with diabetes   09/20/2017 04:10 AM 4.9 4.8 - 5.6 % Final    Comment:    (NOTE) Pre diabetes:          5.7%-6.4% Diabetes:              >6.4% Glycemic control for   <7.0% adults with diabetes     CBG: Recent Labs  Lab 06/15/19 0740 06/15/19 1139 06/15/19 1613 06/15/19 2128 06/16/19 0735  GLUCAP 136* 232* 120* 144* 140*     Scheduled Meds: . acidophilus  1 capsule Oral Daily  . albuterol  2 puff Inhalation QID  . amiodarone  200 mg Oral BID  . amoxicillin-clavulanate  1 tablet Oral Q12H  . apixaban  5 mg Oral BID  . ARIPiprazole  2 mg Oral QODAY  . feeding supplement (PRO-STAT SUGAR FREE 64)  30 mL Oral BID  . fluticasone furoate-vilanterol  1 puff  Inhalation Daily  . furosemide  80 mg Intravenous Daily  . guaiFENesin  1,200 mg Oral BID  . insulin aspart  0-20 Units Subcutaneous TID WC  . insulin aspart  0-5 Units  Subcutaneous QHS  . lamoTRIgine  25 mg Oral Daily  . metoprolol succinate  12.5 mg Oral Daily  . polyethylene glycol  17 g Oral QODAY  . pravastatin  40 mg Oral q1800  . pregabalin  100 mg Oral BID  . senna  2 tablet Oral QHS  . sodium chloride flush  10-40 mL Intracatheter Q12H  . sodium chloride flush  3 mL Intravenous Q12H  . trazodone  300 mg Oral QHS  . umeclidinium bromide  1 puff Inhalation Daily  . venlafaxine XR  300 mg Oral Daily  . venlafaxine XR  37.5 mg Oral Daily     LOS: 12 days   Cherene Altes, MD [facility] Office  [phone] Pager - Text Page per Amion  If 7PM-7AM, please contact night-coverage per Amion 06/16/2019, 10:42 AM

## 2019-06-16 NOTE — Progress Notes (Signed)
I was contacted by lab with a critical lab value for MRSA pos.   Joette Catching, MD was notified via text page.

## 2019-06-16 NOTE — Progress Notes (Signed)
[facility] for Apixaban Indication: atrial fibrillation  Allergies  Allergen Reactions  . Aspirin Hives    Patient Measurements: Height: 5\' 2"  (157.5 cm) Weight: (!) 303 lb 12.7 oz (137.8 kg) IBW/kg (Calculated) : 50.1  Vital Signs: Temp: 97.3 F (36.3 C) (07/01 0735) Temp Source: Axillary (07/01 0735) BP: 153/139 (07/01 0800) Pulse Rate: 82 (07/01 1155)  Labs: Recent Labs    06/14/19 0500 06/15/19 0625 06/15/19 0738  HGB 9.2* 9.7* 10.5*  HCT 33.0* 33.7* 31.0*  PLT 269 287  --   CREATININE 0.69 0.62  --     Estimated Creatinine Clearance: 104.4 mL/min (by C-G formula based on SCr of 0.62 mg/dL).  Assessment: 58 y/o F with a h/o COPD and CHF admitted for worsening respiratory failure and found to be positive for SARS-CoV-2. Patient started on apixaban for new-onset atrial fibrillation. H/H low stable, Plt wnl. SCr wnl.  CHA2DS2-VASc=4  Plan:  - Continue apixaban 5 mg PO BID.  - Monitor renal function, CBC, signs of bleeding  - Pharmacy will continue to f/u peripherally, but will sign off note writing.  Gretta Arab PharmD, BCPS Clinical pharmacist phone 7am- 5pm: [phone] 06/16/2019 12:53 PM

## 2019-06-16 NOTE — Procedures (Addendum)
Cortrak  Person Inserting Tube:  Maylon Peppers C, RD Tube Type:  Cortrak - 43 inches Tube Location:  Left nare Initial Placement:  Stomach Secured by: Bridle Technique Used to Measure Tube Placement:  Documented cm marking at nare/ corner of mouth Cortrak Secured At:  70 cm    Cortrak Tube Team Note:  Consult received to place a Cortrak feeding tube.  Cortrak placement will require xray due to large body habitus which can alter the cortrak reading.   X-ray is required, abdominal x-ray has been ordered by the Cortrak team. Please confirm tube placement before using the Cortrak tube.   If the tube becomes dislodged please keep the tube and contact the Cortrak team at [email or web address] (password [email or web address]) for replacement.  If after hours and replacement cannot be delayed, place a NG tube and confirm placement with an abdominal x-ray.    [place], [place], [place] Pager [phone] After Hours Pager

## 2019-06-16 NOTE — Progress Notes (Signed)
Kathleen Roy, the pt's primary contact was called and provided with a daily update regarding the pt's current status.

## 2019-06-16 NOTE — Progress Notes (Signed)
Cheri Guppy, MD was paged regarding the pt's SpO2 of 75% on 16L/non-breather. After the pt was asked to take slow, deep breaths, the SpO2 increased to 77%. MD returned my page and is coming to see the pt in person now. I will continue to monitor.

## 2019-06-16 NOTE — Progress Notes (Signed)
Joette Catching, MD was paged regarding the ABD CT impression of the feeding tube.  IMPRESSION: Enteric feeding tube tip at the pylorus. Advance 5 centimeters if post pyloric placement is desired.

## 2019-06-16 DEATH — deceased

## 2019-06-17 DIAGNOSIS — E662 Morbid (severe) obesity with alveolar hypoventilation: Secondary | ICD-10-CM

## 2019-06-17 DIAGNOSIS — G4733 Obstructive sleep apnea (adult) (pediatric): Secondary | ICD-10-CM

## 2019-06-17 LAB — CBC WITH DIFFERENTIAL/PLATELET
Abs Immature Granulocytes: 0.17 10*3/uL — ABNORMAL HIGH (ref 0.00–0.07)
Basophils Absolute: 0 10*3/uL (ref 0.0–0.1)
Basophils Relative: 0 %
Eosinophils Absolute: 0 10*3/uL (ref 0.0–0.5)
Eosinophils Relative: 0 %
HCT: 31.8 % — ABNORMAL LOW (ref 36.0–46.0)
Hemoglobin: 8.7 g/dL — ABNORMAL LOW (ref 12.0–15.0)
Immature Granulocytes: 3 %
Lymphocytes Relative: 13 %
Lymphs Abs: 0.8 10*3/uL (ref 0.7–4.0)
MCH: 28.9 pg (ref 26.0–34.0)
MCHC: 27.4 g/dL — ABNORMAL LOW (ref 30.0–36.0)
MCV: 105.6 fL — ABNORMAL HIGH (ref 80.0–100.0)
Monocytes Absolute: 0.5 10*3/uL (ref 0.1–1.0)
Monocytes Relative: 8 %
Neutro Abs: 4.6 10*3/uL (ref 1.7–7.7)
Neutrophils Relative %: 76 %
Platelets: 271 10*3/uL (ref 150–400)
RBC: 3.01 MIL/uL — ABNORMAL LOW (ref 3.87–5.11)
RDW: 16.2 % — ABNORMAL HIGH (ref 11.5–15.5)
WBC: 6.1 10*3/uL (ref 4.0–10.5)
nRBC: 0.3 % — ABNORMAL HIGH (ref 0.0–0.2)

## 2019-06-17 LAB — GLUCOSE, CAPILLARY
Glucose-Capillary: 154 mg/dL — ABNORMAL HIGH (ref 70–99)
Glucose-Capillary: 168 mg/dL — ABNORMAL HIGH (ref 70–99)
Glucose-Capillary: 178 mg/dL — ABNORMAL HIGH (ref 70–99)
Glucose-Capillary: 183 mg/dL — ABNORMAL HIGH (ref 70–99)
Glucose-Capillary: 205 mg/dL — ABNORMAL HIGH (ref 70–99)
Glucose-Capillary: 216 mg/dL — ABNORMAL HIGH (ref 70–99)
Glucose-Capillary: 239 mg/dL — ABNORMAL HIGH (ref 70–99)

## 2019-06-17 LAB — COMPREHENSIVE METABOLIC PANEL
ALT: 52 U/L — ABNORMAL HIGH (ref 0–44)
AST: 31 U/L (ref 15–41)
Albumin: 2.9 g/dL — ABNORMAL LOW (ref 3.5–5.0)
Alkaline Phosphatase: 152 U/L — ABNORMAL HIGH (ref 38–126)
Anion gap: 8 (ref 5–15)
BUN: 18 mg/dL (ref 6–20)
CO2: 48 mmol/L — ABNORMAL HIGH (ref 22–32)
Calcium: 9.2 mg/dL (ref 8.9–10.3)
Chloride: 89 mmol/L — ABNORMAL LOW (ref 98–111)
Creatinine, Ser: 0.57 mg/dL (ref 0.44–1.00)
GFR calc Af Amer: >60 mL/min (ref >60)
GFR calc non Af Amer: >60 mL/min (ref >60)
Glucose, Bld: 200 mg/dL — ABNORMAL HIGH (ref 70–99)
Potassium: 3.4 mmol/L — ABNORMAL LOW (ref 3.5–5.1)
Sodium: 145 mmol/L (ref 135–145)
Total Bilirubin: 0.2 mg/dL — ABNORMAL LOW (ref 0.3–1.2)
Total Protein: 6 g/dL — ABNORMAL LOW (ref 6.5–8.1)

## 2019-06-17 MED ORDER — INSULIN ASPART 100 UNIT/ML ~~LOC~~ SOLN
0.0000 [IU] | SUBCUTANEOUS | Status: DC
  Administered 2019-06-17: 01:00:00 7 [IU] via SUBCUTANEOUS
  Administered 2019-06-17: 4 [IU] via SUBCUTANEOUS
  Administered 2019-06-17: 7 [IU] via SUBCUTANEOUS
  Administered 2019-06-17 (×2): 4 [IU] via SUBCUTANEOUS
  Administered 2019-06-17: 17:00:00 7 [IU] via SUBCUTANEOUS
  Administered 2019-06-17: 09:00:00 4 [IU] via SUBCUTANEOUS
  Administered 2019-06-18 (×2): 7 [IU] via SUBCUTANEOUS
  Administered 2019-06-18: 11 [IU] via SUBCUTANEOUS
  Administered 2019-06-18 (×3): 4 [IU] via SUBCUTANEOUS
  Administered 2019-06-19: 13:00:00 11 [IU] via SUBCUTANEOUS
  Administered 2019-06-19 (×2): 4 [IU] via SUBCUTANEOUS
  Administered 2019-06-19 (×2): 7 [IU] via SUBCUTANEOUS
  Administered 2019-06-19: 4 [IU] via SUBCUTANEOUS
  Administered 2019-06-20: 7 [IU] via SUBCUTANEOUS

## 2019-06-17 MED ORDER — AMIODARONE HCL 100 MG PO TABS
100.0000 mg | ORAL_TABLET | Freq: Two times a day (BID) | ORAL | Status: DC
  Administered 2019-06-17 – 2019-06-19 (×6): 100 mg
  Filled 2019-06-17 (×5): qty 1

## 2019-06-17 MED ORDER — FUROSEMIDE 10 MG/ML IJ SOLN
80.0000 mg | Freq: Three times a day (TID) | INTRAMUSCULAR | Status: DC
  Administered 2019-06-17 – 2019-06-19 (×5): 80 mg via INTRAVENOUS
  Filled 2019-06-17 (×5): qty 8

## 2019-06-17 NOTE — Progress Notes (Signed)
Patient's friend Larinda Buttery updated on patient's status.

## 2019-06-17 NOTE — Progress Notes (Addendum)
Patient's O2 saturations consistently in low to mid 90s on 15L NRB. Discussed with RT weaning patient to HFNC, decided to attempt in AM if patient more alert. Blood pressure continues to be low 90s/40s. Held metoprolol per parameter. Dr. Loleta Books paged regarding IV lasix. MD modified order to hold lasix if SBP <95. Held 2200 dose.   0150: Patient complaining of moderate mouth pain and dryness from mask. O2 sats mid to upper 90s for past few hours, placed on 15L HFNC.

## 2019-06-17 NOTE — Progress Notes (Signed)
[place] TEAM 1 - Stepdown/ICU TEAM  Kathleen Roy  WCH:[id number] DOB: 08-Jun-1961 DOA: 05/28/2019 PCP: [name], MD    Brief Narrative:  58yo SNF resident with a hx of COPD, chronic hypoxic respiratory failure, DM2, hypertension, obstructive sleep apnea, and chronic diastolic CHF who presented to the ED w/ worsening lethargy and increased supplemental oxygen requirement. She was found to be COVID-19 positive, and also suffering from aspiration pneumonia. She developed A. fib with RVR 06/07/2019.  Significant Events: 5/29 > 6/7 admit to [facility] w/ resp failure - SARS-CoV2 negative x3 6/19 admit via [facility] 6/20 transfer to [facility] from ED   COVID-19 specific Treatment: Remdesivir 6/20 > 6/24 Steroids 6/19 > 6/26  Subjective: Continues to require high level O2 support.  Is lethargic/sleepy at the time of visit but does not appear to be in significant distress or uncontrolled pain.  Assessment & Plan:  Aspiration pneumonia with lung collapse Eats and drinks when fully supine - body habitus prevents adequate positioning -maintain n.p.o. status for now and continue tube feeding only -titrate oxygen support as necessary to maximize saturations  COVID Pneumonia Has completed treatment with Remdesivir -suspect aspiration is to blame for most of her acute issues at this time - currently on no COVID specific tx   Acute on chronic hypoxic respiratory failure -hypercapnic respiratory failure Baseline 4 L oxygen dependence -much worse now likely related to aspiration -keeping n.p.o. -wean oxygen support as able  Newly diagnosed atrial fibrillation with RVR First noted 6/22 - NSR presently with some bradycardia -decrease dose of amiodarone  COPD Well compensated at this time  Acute on chronic diastolic CHF Diurese as able  HTN Blood pressure marginal at present   Morbid obesity - Body mass index is 55.56 kg/m.  DM2 with transient hypoglycemia CBG stable at this time  Macrocytic  anemia Check P53 and folic acid  DVT prophylaxis: apixaban Code Status: DNR - NO CODE Family Communication:  Disposition Plan:   Consultants:  none  Antimicrobials:  Augmentin 6/26 >  Objective: Blood pressure (!) 101/29, pulse 90, temperature 98.6 F (37 C), temperature source Oral, resp. rate (!) 23, height 5\' 2"  (1.575 m), weight (!) 137.8 kg, SpO2 (!) 88 %.  Intake/Output Summary (Last 24 hours) at 06/17/2019 0940 Last data filed at 06/17/2019 0510 Gross per 24 hour  Intake 860.92 ml  Output 2000 ml  Net -1139.08 ml   Filed Weights   06/14/19 0452 06/15/19 0500 06/16/19 0500  Weight: (!) 141 kg (!) 138 kg (!) 137.8 kg    Examination: General: sedate - no evidence of discomfort  Lungs: Very difficult to auscultate breath sounds with no wheezing Cardiovascular: Distant heart sounds but RRR without murmur Abdomen: Morbidly obese, soft, no rebound, no masses appreciable Extremities: 1+ bilateral lower extremity edema persists  CBC: Recent Labs  Lab 06/15/19 0625 06/15/19 0738 06/16/19 1513 06/17/19 0450  WBC 7.5  --  6.6 6.1  NEUTROABS  --   --   --  4.6  HGB 9.7* 10.5* 9.2* 8.7*  HCT 33.7* 31.0* 32.1* 31.8*  MCV 103.1*  --  104.2* 105.6*  PLT 287  --  265 614   Basic Metabolic Panel: Recent Labs  Lab 06/11/19 0809 06/11/19 1030  06/15/19 0625 06/15/19 0738 06/16/19 1513 06/16/19 1514 06/17/19 0450  NA QUESTIONABLE RESULTS, RECOMMEND RECOLLECT TO VERIFY 141   < > 142 135 143  --  145  K QUESTIONABLE RESULTS, RECOMMEND RECOLLECT TO VERIFY 3.3*   < >  4.4 4.9 3.5  --  3.4*  CL QUESTIONABLE RESULTS, RECOMMEND RECOLLECT TO VERIFY 85*   < > 87*  --  88*  --  89*  CO2 QUESTIONABLE RESULTS, RECOMMEND RECOLLECT TO VERIFY 43*   < > 46*  --  45*  --  48*  GLUCOSE QUESTIONABLE RESULTS, RECOMMEND RECOLLECT TO VERIFY 221*   < > 158*  --  102*  --  200*  BUN QUESTIONABLE RESULTS, RECOMMEND RECOLLECT TO VERIFY 49*   < > 31*  --  21*  --  18  CREATININE QUESTIONABLE  RESULTS, RECOMMEND RECOLLECT TO VERIFY 0.83   < > 0.62  --  0.50  --  0.57  CALCIUM QUESTIONABLE RESULTS, RECOMMEND RECOLLECT TO VERIFY 9.3   < > 10.0  --  9.2  --  9.2  MG QUESTIONABLE RESULTS, RECOMMEND RECOLLECT TO VERIFY 2.3  --   --   --   --  2.1  --   PHOS  --   --   --   --   --   --  3.6  --    < > = values in this interval not displayed.   GFR: Estimated Creatinine Clearance: 104.4 mL/min (by C-G formula based on SCr of 0.57 mg/dL).  Liver Function Tests: Recent Labs  Lab 06/10/19 1120 06/17/19 0450  AST 20 31  ALT 31 52*  ALKPHOS 101 152*  BILITOT 0.4 0.2*  PROT 6.1* 6.0*  ALBUMIN 2.7* 2.9*    HbA1C: Hgb A1c MFr Bld  Date/Time Value Ref Range Status  05/16/2019 02:21 AM 5.5 4.8 - 5.6 % Final    Comment:    (NOTE) Pre diabetes:          5.7%-6.4% Diabetes:              >6.4% Glycemic control for   <7.0% adults with diabetes   09/20/2017 04:10 AM 4.9 4.8 - 5.6 % Final    Comment:    (NOTE) Pre diabetes:          5.7%-6.4% Diabetes:              >6.4% Glycemic control for   <7.0% adults with diabetes     CBG: Recent Labs  Lab 06/16/19 1658 06/16/19 2115 06/17/19 0034 06/17/19 0419 06/17/19 0904  GLUCAP 105* 178* 239* 205* 154*     Scheduled Meds: . acidophilus  1 capsule Oral Daily  . amiodarone  200 mg Per Tube BID  . amoxicillin-clavulanate  875 mg Per Tube Q12H  . apixaban  5 mg Per Tube BID  . [START ON 06/18/2019] ARIPiprazole  2 mg Per Tube QODAY  . feeding supplement (PRO-STAT SUGAR FREE 64)  30 mL Per Tube TID  . fluticasone furoate-vilanterol  1 puff Inhalation Daily  . furosemide  80 mg Intravenous Q12H  . guaiFENesin  5 mL Per Tube TID  . insulin aspart  0-20 Units Subcutaneous Q4H  . lamoTRIgine  25 mg Per Tube Daily  . metoprolol tartrate  6.25 mg Per Tube BID  . polyethylene glycol  17 g Per Tube QODAY  . pravastatin  40 mg Per Tube q1800  . pregabalin  100 mg Per Tube BID  . senna  2 tablet Per Tube QHS  . sodium chloride  flush  10-40 mL Intracatheter Q12H  . sodium chloride flush  3 mL Intravenous Q12H  . trazodone  300 mg Per Tube QHS  . umeclidinium bromide  1 puff Inhalation Daily  .  venlafaxine  187.5 mg Per Tube Daily   And  . venlafaxine  150 mg Per Tube QHS     LOS: 13 days   Cherene Altes, MD [facility] Office  [phone] Pager - Text Page per Amion  If 7PM-7AM, please contact night-coverage per Amion 06/17/2019, 9:40 AM

## 2019-06-18 ENCOUNTER — Inpatient Hospital Stay (HOSPITAL_COMMUNITY): Payer: Medicare Other

## 2019-06-18 LAB — COMPREHENSIVE METABOLIC PANEL
ALT: 44 U/L (ref 0–44)
AST: 19 U/L (ref 15–41)
Albumin: 3.1 g/dL — ABNORMAL LOW (ref 3.5–5.0)
Alkaline Phosphatase: 151 U/L — ABNORMAL HIGH (ref 38–126)
Anion gap: 12 (ref 5–15)
BUN: 21 mg/dL — ABNORMAL HIGH (ref 6–20)
CO2: 47 mmol/L — ABNORMAL HIGH (ref 22–32)
Calcium: 9.5 mg/dL (ref 8.9–10.3)
Chloride: 87 mmol/L — ABNORMAL LOW (ref 98–111)
Creatinine, Ser: 0.6 mg/dL (ref 0.44–1.00)
GFR calc Af Amer: >60 mL/min (ref >60)
GFR calc non Af Amer: >60 mL/min (ref >60)
Glucose, Bld: 177 mg/dL — ABNORMAL HIGH (ref 70–99)
Potassium: 3.1 mmol/L — ABNORMAL LOW (ref 3.5–5.1)
Sodium: 146 mmol/L — ABNORMAL HIGH (ref 135–145)
Total Bilirubin: 0.5 mg/dL (ref 0.3–1.2)
Total Protein: 6.5 g/dL (ref 6.5–8.1)

## 2019-06-18 LAB — IRON AND TIBC
Iron: 41 ug/dL (ref 28–170)
Saturation Ratios: 14 % (ref 10.4–31.8)
TIBC: 285 ug/dL (ref 250–450)
UIBC: 244 ug/dL

## 2019-06-18 LAB — FOLATE: Folate: 14.4 ng/mL (ref >5.9)

## 2019-06-18 LAB — RETICULOCYTES
Immature Retic Fract: 34.2 % — ABNORMAL HIGH (ref 2.3–15.9)
RBC.: 3.04 MIL/uL — ABNORMAL LOW (ref 3.87–5.11)
Retic Count, Absolute: 140.8 10*3/uL (ref 19.0–186.0)
Retic Ct Pct: 4.6 % — ABNORMAL HIGH (ref 0.4–3.1)

## 2019-06-18 LAB — CBC
HCT: 32.8 % — ABNORMAL LOW (ref 36.0–46.0)
Hemoglobin: 9 g/dL — ABNORMAL LOW (ref 12.0–15.0)
MCH: 29.6 pg (ref 26.0–34.0)
MCHC: 27.4 g/dL — ABNORMAL LOW (ref 30.0–36.0)
MCV: 107.9 fL — ABNORMAL HIGH (ref 80.0–100.0)
Platelets: 256 10*3/uL (ref 150–400)
RBC: 3.04 MIL/uL — ABNORMAL LOW (ref 3.87–5.11)
RDW: 16.4 % — ABNORMAL HIGH (ref 11.5–15.5)
WBC: 7 10*3/uL (ref 4.0–10.5)
nRBC: 0.3 % — ABNORMAL HIGH (ref 0.0–0.2)

## 2019-06-18 LAB — VITAMIN B12: Vitamin B-12: 566 pg/mL (ref 180–914)

## 2019-06-18 LAB — GLUCOSE, CAPILLARY
Glucose-Capillary: 180 mg/dL — ABNORMAL HIGH (ref 70–99)
Glucose-Capillary: 202 mg/dL — ABNORMAL HIGH (ref 70–99)
Glucose-Capillary: 164 mg/dL — ABNORMAL HIGH (ref 70–99)
Glucose-Capillary: 157 mg/dL — ABNORMAL HIGH (ref 70–99)
Glucose-Capillary: 208 mg/dL — ABNORMAL HIGH (ref 70–99)
Glucose-Capillary: 214 mg/dL — ABNORMAL HIGH (ref 70–99)
Glucose-Capillary: 280 mg/dL — ABNORMAL HIGH (ref 70–99)

## 2019-06-18 LAB — FERRITIN: Ferritin: 311 ng/mL — ABNORMAL HIGH (ref 11–307)

## 2019-06-18 MED ORDER — ONDANSETRON HCL 4 MG/2ML IJ SOLN
4.0000 mg | Freq: Four times a day (QID) | INTRAMUSCULAR | Status: DC | PRN
  Administered 2019-06-18: 10:00:00 4 mg via INTRAVENOUS
  Filled 2019-06-18: qty 2

## 2019-06-18 MED ORDER — POTASSIUM CHLORIDE 20 MEQ/15ML (10%) PO SOLN
20.0000 meq | Freq: Three times a day (TID) | ORAL | Status: DC
  Administered 2019-06-18 – 2019-06-19 (×5): 20 meq
  Filled 2019-06-18 (×5): qty 15

## 2019-06-18 NOTE — Progress Notes (Signed)
[place] TEAM 1 - Stepdown/ICU TEAM  CARAMIA BOUTIN  ZOX:[id number] DOB: Apr 06, 1961 DOA: 06/03/2019 PCP: [name], MD    Brief Narrative:  58yo SNF resident with a hx of COPD, chronic hypoxic respiratory failure, DM2, hypertension, obstructive sleep apnea, and chronic diastolic CHF who presented to the ED w/ worsening lethargy and increased supplemental oxygen requirement. She was found to be COVID-19 positive, and also suffering from aspiration pneumonia. She developed A. fib with RVR 06/07/2019.  Significant Events: 5/29 > 6/7 admit to [facility] w/ resp failure - SARS-CoV2 negative x3 6/19 admit via [facility] 6/20 transfer to [facility] from ED   COVID-19 specific Treatment: Remdesivir 6/20 > 6/24 Steroids 6/19 > 6/26  Subjective: Oxygen saturations appear to be stabilizing somewhat though patient continues to require high level support.  She is significantly more alert and interactive today and clinically does appear to be leveling off.  She denies chest pain nausea or vomiting.  She is begging to be allowed some oral intake.  Assessment & Plan:  Aspiration pneumonia with lung collapse Eats and drinks when fully supine - body habitus prevents adequate positioning -maintain n.p.o. status for now and continue tube feeding only -titrate oxygen support as necessary to maximize saturations  COVID Pneumonia Has completed treatment with Remdesivir -suspect aspiration is to blame for most of her acute issues at this time - currently on no COVID specific tx   Acute on chronic hypoxic respiratory failure -hypercapnic respiratory failure Baseline 4 L oxygen dependence - worse now likely related to aspiration - keeping n.p.o. - wean oxygen support as able  Newly diagnosed atrial fibrillation with RVR First noted 6/22 - NSR presently - bradycardia has improved w/ holding of BB and lower dose of amio - consider stopping amio if remains in NSR   COPD Well compensated at this time  Acute on chronic  diastolic CHF Diurese as able  HTN Blood pressure marginal at present   Morbid obesity - Body mass index is 44.35 kg/m.  DM2 with transient hypoglycemia CBG reasonably well-controlled  Macrocytic anemia W11 and folic acid are normal   DVT prophylaxis: apixaban Code Status: DNR - NO CODE Family Communication:  Disposition Plan:   Consultants:  none  Antimicrobials:  Augmentin 6/26 > 7/2   Objective: Blood pressure (!) 111/58, pulse 99, temperature 98.3 F (36.8 C), temperature source Oral, resp. rate 19, height 5\' 2"  (1.575 m), weight 110 kg, SpO2 (!) 85 %.  Intake/Output Summary (Last 24 hours) at 06/18/2019 0759 Last data filed at 06/18/2019 0600 Gross per 24 hour  Intake 1530.83 ml  Output 2100 ml  Net -569.17 ml   Filed Weights   06/15/19 0500 06/16/19 0500 06/18/19 0337  Weight: (!) 138 kg (!) 137.8 kg 110 kg    Examination: General: More alert and conversant in no acute distress Lungs: Very distant breath sounds with very poor air movement in bases Cardiovascular: Very distant heart sounds with no appreciable murmur or rub Abdomen: Morbidly obese, soft, no rebound, no masses appreciable Extremities: 1+ bilateral lower extremity edema without change  CBC: Recent Labs  Lab 06/16/19 1513 06/17/19 0450 06/18/19 0507  WBC 6.6 6.1 7.0  NEUTROABS  --  4.6  --   HGB 9.2* 8.7* 9.0*  HCT 32.1* 31.8* 32.8*  MCV 104.2* 105.6* 107.9*  PLT 265 271 914   Basic Metabolic Panel: Recent Labs  Lab 06/11/19 0809 06/11/19 1030  06/16/19 1513 06/16/19 1514 06/17/19 0450 06/18/19 0507  NA QUESTIONABLE RESULTS,  RECOMMEND RECOLLECT TO VERIFY 141   < > 143  --  145 146*  K QUESTIONABLE RESULTS, RECOMMEND RECOLLECT TO VERIFY 3.3*   < > 3.5  --  3.4* 3.1*  CL QUESTIONABLE RESULTS, RECOMMEND RECOLLECT TO VERIFY 85*   < > 88*  --  89* 87*  CO2 QUESTIONABLE RESULTS, RECOMMEND RECOLLECT TO VERIFY 43*   < > 45*  --  48* 47*  GLUCOSE QUESTIONABLE RESULTS, RECOMMEND RECOLLECT  TO VERIFY 221*   < > 102*  --  200* 177*  BUN QUESTIONABLE RESULTS, RECOMMEND RECOLLECT TO VERIFY 49*   < > 21*  --  18 21*  CREATININE QUESTIONABLE RESULTS, RECOMMEND RECOLLECT TO VERIFY 0.83   < > 0.50  --  0.57 0.60  CALCIUM QUESTIONABLE RESULTS, RECOMMEND RECOLLECT TO VERIFY 9.3   < > 9.2  --  9.2 9.5  MG QUESTIONABLE RESULTS, RECOMMEND RECOLLECT TO VERIFY 2.3  --   --  2.1  --   --   PHOS  --   --   --   --  3.6  --   --    < > = values in this interval not displayed.   GFR: Estimated Creatinine Clearance: 90.8 mL/min (by C-G formula based on SCr of 0.6 mg/dL).  Liver Function Tests: Recent Labs  Lab 06/17/19 0450 06/18/19 0507  AST 31 19  ALT 52* 44  ALKPHOS 152* 151*  BILITOT 0.2* 0.5  PROT 6.0* 6.5  ALBUMIN 2.9* 3.1*    HbA1C: Hgb A1c MFr Bld  Date/Time Value Ref Range Status  05/16/2019 02:21 AM 5.5 4.8 - 5.6 % Final    Comment:    (NOTE) Pre diabetes:          5.7%-6.4% Diabetes:              >6.4% Glycemic control for   <7.0% adults with diabetes   09/20/2017 04:10 AM 4.9 4.8 - 5.6 % Final    Comment:    (NOTE) Pre diabetes:          5.7%-6.4% Diabetes:              >6.4% Glycemic control for   <7.0% adults with diabetes     CBG: Recent Labs  Lab 06/17/19 1218 06/17/19 1651 06/17/19 1924 06/17/19 2311 06/18/19 0334  GLUCAP 178* 216* 168* 183* 180*     Scheduled Meds: . amiodarone  100 mg Per Tube BID  . apixaban  5 mg Per Tube BID  . ARIPiprazole  2 mg Per Tube QODAY  . feeding supplement (PRO-STAT SUGAR FREE 64)  30 mL Per Tube TID  . fluticasone furoate-vilanterol  1 puff Inhalation Daily  . furosemide  80 mg Intravenous Q8H  . guaiFENesin  5 mL Per Tube TID  . insulin aspart  0-20 Units Subcutaneous Q4H  . lamoTRIgine  25 mg Per Tube Daily  . metoprolol tartrate  6.25 mg Per Tube BID  . polyethylene glycol  17 g Per Tube QODAY  . pravastatin  40 mg Per Tube q1800  . pregabalin  100 mg Per Tube BID  . senna  2 tablet Per Tube QHS  .  sodium chloride flush  10-40 mL Intracatheter Q12H  . sodium chloride flush  3 mL Intravenous Q12H  . trazodone  300 mg Per Tube QHS  . umeclidinium bromide  1 puff Inhalation Daily  . venlafaxine  187.5 mg Per Tube Daily   And  . venlafaxine  150 mg Per  Tube QHS     LOS: 14 days   Cherene Altes, MD [facility] Office  [phone] Pager - Text Page per Amion  If 7PM-7AM, please contact night-coverage per Amion 06/18/2019, 7:59 AM

## 2019-06-18 NOTE — Progress Notes (Addendum)
Patient had removed NRB and desat to 42%. O2 slow to recover. Maintaining sats 78-85% on 15L NRB. Patient has been lethargic. All evening sedating meds held earlier. MD on call notified of patient's status. RT to evaluate.   2345: Patient restless and pulling NRB off. Disoriented to place, time and situation when previously A/O X4. Dr. Loleta Books paged.   0145: Critical ABG results reported to Dr. Loleta Books. RT at bedside. Plan to place patient on salter for 3 hours and repeat ABG at 0545.  0605: Critical ABG results reported to Dr. Loleta Books. Patient more arouseable and oriented X 3. No changes to plan of care at this time.

## 2019-06-18 NOTE — Progress Notes (Addendum)
Occupational Therapy Treatment - and Discharge Patient Details Name: Kathleen Roy MRN: [id number] DOB: 1961/11/18 Today's Date: 06/18/2019    History of present illness 58 yo female admitted from SNF with increased respiratory distress, H/O morbid obesity, OSA DHF, HTN, DM,Myoclonus, fibromyalgia, depression.Positive for covid-19.   OT comments  Today Pt worked on bathing, max A +2 to total A for all aspects of bed repositioning/mobility, and UB and LB bathing. She requires encouragement to participate in all aspects. She was on 15L HFNCS throughout session and varied between 82-89% SpO2. She does not know what happened to her theraband and HEP that was provided for her. Another provided. As she is NPO, OT will sign off. If her diet changes and there are concerns for the ability to self-feed, please feel free to re-order, otherwise, Pt is very close to baseline for other aspects of ADL. Thank you for the opportunity to serve this patient.    Follow Up Recommendations  SNF;Supervision/Assistance - 24 hour    Equipment Recommendations  None recommended by OT    Recommendations for Other Services (Palliative)    Precautions / Restrictions Precautions Precautions: Other (comment) Precaution Comments: monitor sats Restrictions Weight Bearing Restrictions: No       Mobility Bed Mobility Overal bed mobility: Needs Assistance   Rolling: Max assist;+2 for physical assistance;+2 for safety/equipment         General bed mobility comments: max A +2 to total A for all aspects of bed mobility  Transfers                 General transfer comment: NT    Balance                                           ADL either performed or assessed with clinical judgement   ADL Overall ADL's : Needs assistance/impaired Eating/Feeding: NPO       Upper Body Bathing: Maximal assistance;Bed level Upper Body Bathing Details (indicate cue type and reason): +2 for rolling and  getting back Lower Body Bathing: Total assistance;+2 for safety/equipment;+2 for physical assistance;Bed level Lower Body Bathing Details (indicate cue type and reason): bed level             Toileting- Clothing Manipulation and Hygiene: Total assistance;+2 for physical assistance;+2 for safety/equipment;Bed level Toileting - Clothing Manipulation Details (indicate cue type and reason): rolling for cleaning, use of bed pads to assist       General ADL Comments: Pt was aspirating and is now NPO. NT present and assisted with bathing, which meant rolling in the bed, Pt partcipating minimally requiring encouragement     Vision       Perception     Praxis      Cognition Arousal/Alertness: Awake/alert Behavior During Therapy: WFL for tasks assessed/performed Overall Cognitive Status: Within Functional Limits for tasks assessed                                          Exercises     Shoulder Instructions       General Comments Pt on 15 L HFNC throughout session SpO2 going between 84-89% throughout session    Pertinent Vitals/ Pain       Pain Assessment: Faces Faces Pain Scale: No hurt Pain Intervention(s):  Monitored during session;Repositioned  Home Living                                          Prior Functioning/Environment              Frequency  Other (comment)(discontinue OT services)        Progress Toward Goals  OT Goals(current goals can now be found in the care plan section)  Progress towards OT goals: Not progressing toward goals - comment(discharge from OT as Pt not appropriate)  Acute Rehab OT Goals Patient Stated Goal: "Be able to feed myself" OT Goal Formulation: With patient Time For Goal Achievement: 06/22/19 Potential to Achieve Goals: Good ADL Goals Pt Will Perform Eating: with modified independence;with adaptive utensils;bed level;with assist to don/doff brace/orthosis Pt Will Perform Grooming: with  modified independence;bed level;with adaptive equipment Pt/caregiver will Perform Home Exercise Program: Increased ROM;Increased strength;Both right and left upper extremity;With Supervision;With written HEP provided Additional ADL Goal #1: Pt will perform rolling in bed with Max A to participate in ADLs at bed level  Plan Other (comment)(dc from OT services at this time)    Co-evaluation                 AM-PAC OT "6 Clicks" Daily Activity     Outcome Measure   Help from another person eating meals?: Total(NPO) Help from another person taking care of personal grooming?: A Lot Help from another person toileting, which includes using toliet, bedpan, or urinal?: Total Help from another person bathing (including washing, rinsing, drying)?: Total Help from another person to put on and taking off regular upper body clothing?: A Lot Help from another person to put on and taking off regular lower body clothing?: Total 6 Click Score: 8    End of Session Equipment Utilized During Treatment: Oxygen(15 L HFNC)  OT Visit Diagnosis: Unsteadiness on feet (R26.81);Other abnormalities of gait and mobility (R26.89);Muscle weakness (generalized) (M62.81)   Activity Tolerance Patient limited by fatigue   Patient Left in bed;with call bell/phone within reach;with nursing/sitter in room   Nurse Communication Mobility status        Time: 8185-6314 OT Time Calculation (min): 27 min  Charges: OT General Charges $OT Visit: 1 Visit OT Treatments $Self Care/Home Management : 23-37 mins  Hulda Humphrey OTR/L Acute Rehabilitation Services Pager: [phone] Office: [employer] 06/18/2019, 4:52 PM

## 2019-06-18 NOTE — Progress Notes (Signed)
Called patient's friend [name] and updated her on the patient.

## 2019-06-19 DIAGNOSIS — L8990 Pressure ulcer of unspecified site, unspecified stage: Secondary | ICD-10-CM | POA: Insufficient documentation

## 2019-06-19 LAB — GLUCOSE, CAPILLARY
Glucose-Capillary: 173 mg/dL — ABNORMAL HIGH (ref 70–99)
Glucose-Capillary: 183 mg/dL — ABNORMAL HIGH (ref 70–99)
Glucose-Capillary: 195 mg/dL — ABNORMAL HIGH (ref 70–99)
Glucose-Capillary: 209 mg/dL — ABNORMAL HIGH (ref 70–99)
Glucose-Capillary: 211 mg/dL — ABNORMAL HIGH (ref 70–99)
Glucose-Capillary: 261 mg/dL — ABNORMAL HIGH (ref 70–99)

## 2019-06-19 LAB — COMPREHENSIVE METABOLIC PANEL
ALT: 49 U/L — ABNORMAL HIGH (ref 0–44)
AST: 30 U/L (ref 15–41)
Albumin: 3.4 g/dL — ABNORMAL LOW (ref 3.5–5.0)
Alkaline Phosphatase: 218 U/L — ABNORMAL HIGH (ref 38–126)
Anion gap: 12 (ref 5–15)
BUN: 26 mg/dL — ABNORMAL HIGH (ref 6–20)
CO2: 50 mmol/L — ABNORMAL HIGH (ref 22–32)
Calcium: 9.9 mg/dL (ref 8.9–10.3)
Chloride: 90 mmol/L — ABNORMAL LOW (ref 98–111)
Creatinine, Ser: 0.73 mg/dL (ref 0.44–1.00)
GFR calc Af Amer: >60 mL/min (ref >60)
GFR calc non Af Amer: >60 mL/min (ref >60)
Glucose, Bld: 155 mg/dL — ABNORMAL HIGH (ref 70–99)
Potassium: 3.6 mmol/L (ref 3.5–5.1)
Sodium: 152 mmol/L — ABNORMAL HIGH (ref 135–145)
Total Bilirubin: 0.6 mg/dL (ref 0.3–1.2)
Total Protein: 7.5 g/dL (ref 6.5–8.1)

## 2019-06-19 LAB — CBC
HCT: 36.1 % (ref 36.0–46.0)
Hemoglobin: 9.4 g/dL — ABNORMAL LOW (ref 12.0–15.0)
MCH: 28.9 pg (ref 26.0–34.0)
MCHC: 26 g/dL — ABNORMAL LOW (ref 30.0–36.0)
MCV: 111.1 fL — ABNORMAL HIGH (ref 80.0–100.0)
Platelets: 278 10*3/uL (ref 150–400)
RBC: 3.25 MIL/uL — ABNORMAL LOW (ref 3.87–5.11)
RDW: 16.8 % — ABNORMAL HIGH (ref 11.5–15.5)
WBC: 12.8 10*3/uL — ABNORMAL HIGH (ref 4.0–10.5)
nRBC: 0.2 % (ref 0.0–0.2)

## 2019-06-19 LAB — MAGNESIUM: Magnesium: 2.4 mg/dL (ref 1.7–2.4)

## 2019-06-19 MED ORDER — LOPERAMIDE HCL 1 MG/7.5ML PO SUSP
2.0000 mg | ORAL | Status: DC | PRN
  Administered 2019-06-19: 2 mg
  Filled 2019-06-19 (×2): qty 15

## 2019-06-19 MED ORDER — FREE WATER
200.0000 mL | Freq: Four times a day (QID) | Status: DC
  Administered 2019-06-19 – 2019-06-20 (×3): 200 mL

## 2019-06-19 MED ORDER — ALBUTEROL SULFATE HFA 108 (90 BASE) MCG/ACT IN AERS
2.0000 | INHALATION_SPRAY | Freq: Three times a day (TID) | RESPIRATORY_TRACT | Status: DC
  Administered 2019-06-19 (×3): 2 via RESPIRATORY_TRACT

## 2019-06-19 MED ORDER — TRAMADOL HCL 50 MG PO TABS
50.0000 mg | ORAL_TABLET | Freq: Four times a day (QID) | ORAL | Status: DC | PRN
  Administered 2019-06-19: 50 mg via ORAL
  Filled 2019-06-19: qty 1

## 2019-06-19 MED ORDER — FUROSEMIDE 10 MG/ML IJ SOLN
80.0000 mg | Freq: Two times a day (BID) | INTRAMUSCULAR | Status: DC
  Administered 2019-06-19 – 2019-06-20 (×2): 80 mg via INTRAVENOUS
  Filled 2019-06-19 (×2): qty 8

## 2019-06-19 MED ORDER — FREE WATER: 200.0000 mL | Freq: Three times a day (TID) | Status: DC

## 2019-06-19 NOTE — Progress Notes (Addendum)
[place] TEAM 1 - Stepdown/ICU TEAM  Kathleen Roy  PYP:[id number] DOB: [birth year] DOA: 06/01/2019 PCP: [name], MD    Brief Narrative:  58yo SNF resident with a hx of COPD, chronic hypoxic respiratory failure, DM2, hypertension, obstructive sleep apnea/OHS, and chronic diastolic CHF who presented to the ED w/ worsening lethargy and increased supplemental oxygen requirement. She was found to be COVID-19 positive, and also suffering from aspiration pneumonia. She developed A. fib with RVR 06/07/2019.  Significant Events: 5/29 > 6/7 admit to [facility] w/ resp failure - SARS-CoV2 negative x3 6/19 admit via [facility] 6/20 transfer to [facility] from ED   COVID-19 specific Treatment: Remdesivir 6/20 > 6/24 Steroids 6/19 > 6/26  Subjective: The patient had significant difficulty last night with confusion prompting her to remove her oxygen leading to severe desaturations.  She was found to be severely hypercarbic as well, likely related to sleep apnea as well as obesity hypoventilation.  Unfortunately BiPAP is not a viable option given her active COVID infection. At the time of my visit this morning she will wake up for me but is quite lethargic.  She does not appear to be uncomfortable.  Respirations are actually unlabored though her saturations are in the mid to low 80s.  Assessment & Plan:  Acute on chronic hypoxic and hypercapnic respiratory failure Baseline 4 L oxygen dependence - worse now likely related to aspiration, COVID PNA, and OHS/SA - the patient would very much benefit from BiPAP but this is not an option at present given her COVID status - I will transition her to heated high flow nasal cannula and attempt to increase her positive end-expiratory pressure and perhaps improve ventilation somewhat - long term however it appears she will do very poorly unless a trach is considered - I am going to ask Palliative Care to assist [place] in navigating goals of care in this difficult situation    Aspiration pneumonia with lung collapse Eats and drinks when fully supine - body habitus prevents adequate positioning -maintain n.p.o. status for now and continue tube feeding only  COVID Pneumonia Has completed treatment with Remdesivir - suspect aspiration is to blame for most of her acute issues at this time - currently on no COVID specific tx   Newly diagnosed atrial fibrillation with RVR First noted 6/22 - NSR presently though tachycardic - cont amio for now   COPD Well compensated at this time  Acute on chronic diastolic CHF Net negative approximately 20 L since admission and approximately 1400 cc over last 24 hours  Filed Weights   06/15/19 0500 06/16/19 0500 06/18/19 0337  Weight: (!) 138 kg (!) 137.8 kg 110 kg    HTN Blood pressure marginal at present   Morbid obesity - Body mass index is 44.35 kg/m.  DM2 with transient hypoglycemia CBG reasonably well-controlled  Macrocytic anemia I45 and folic acid are normal   DVT prophylaxis: apixaban Code Status: DNR - NO CODE Family Communication:  Disposition Plan: give trial of [place] - Palliative Care consult for goals of care   Consultants:  none  Antimicrobials:  Augmentin 6/26 > 7/2   Objective: Blood pressure 118/69, pulse (!) 102, temperature 98.7 F (37.1 C), temperature source Oral, resp. rate 18, height 5\' 2"  (1.575 m), weight 110 kg, SpO2 (!) 85 %.  Intake/Output Summary (Last 24 hours) at 06/19/2019 0833 Last data filed at 06/19/2019 0730 Gross per 24 hour  Intake 859.42 ml  Output 2425 ml  Net -1565.58 ml  Filed Weights   06/15/19 0500 06/16/19 0500 06/18/19 0337  Weight: (!) 138 kg (!) 137.8 kg 110 kg    Examination: General: Sedate but not in apparent distress Lungs: Extremely poor air movement throughout all fields with no wheezing Cardiovascular: Very difficult to hear breath sounds with no murmur or rub Abdomen: Morbidly obese, soft, no rebound, no masses appreciable Extremities: 2+  bilateral lower extremity edema  CBC: Recent Labs  Lab 06/17/19 0450 06/18/19 0507 06/19/19 0447  WBC 6.1 7.0 12.8*  NEUTROABS 4.6  --   --   HGB 8.7* 9.0* 9.4*  HCT 31.8* 32.8* 36.1  MCV 105.6* 107.9* 111.1*  PLT 271 256 644   Basic Metabolic Panel: Recent Labs  Lab 06/16/19 1514 06/17/19 0450 06/18/19 0507 06/19/19 0447  NA  --  145 146* 152*  K  --  3.4* 3.1* 3.6  CL  --  89* 87* 90*  CO2  --  48* 47* 50*  GLUCOSE  --  200* 177* 155*  BUN  --  18 21* 26*  CREATININE  --  0.57 0.60 0.73  CALCIUM  --  9.2 9.5 9.9  MG 2.1  --   --  2.4  PHOS 3.6  --   --   --    GFR: Estimated Creatinine Clearance: 90.8 mL/min (by C-G formula based on SCr of 0.73 mg/dL).  Liver Function Tests: Recent Labs  Lab 06/17/19 0450 06/18/19 0507 06/19/19 0447  AST 31 19 30   ALT 52* 44 49*  ALKPHOS 152* 151* 218*  BILITOT 0.2* 0.5 0.6  PROT 6.0* 6.5 7.5  ALBUMIN 2.9* 3.1* 3.4*    HbA1C: Hgb A1c MFr Bld  Date/Time Value Ref Range Status  05/16/2019 02:21 AM 5.5 4.8 - 5.6 % Final    Comment:    (NOTE) Pre diabetes:          5.7%-6.4% Diabetes:              >6.4% Glycemic control for   <7.0% adults with diabetes   09/20/2017 04:10 AM 4.9 4.8 - 5.6 % Final    Comment:    (NOTE) Pre diabetes:          5.7%-6.4% Diabetes:              >6.4% Glycemic control for   <7.0% adults with diabetes     CBG: Recent Labs  Lab 06/18/19 1941 06/18/19 2002 06/18/19 2311 06/19/19 0329 06/19/19 0748  GLUCAP 214* 208* 280* 173* 209*     Scheduled Meds: . albuterol  2 puff Inhalation TID  . amiodarone  100 mg Per Tube BID  . apixaban  5 mg Per Tube BID  . ARIPiprazole  2 mg Per Tube QODAY  . feeding supplement (PRO-STAT SUGAR FREE 64)  30 mL Per Tube TID  . fluticasone furoate-vilanterol  1 puff Inhalation Daily  . furosemide  80 mg Intravenous Q8H  . guaiFENesin  5 mL Per Tube TID  . insulin aspart  0-20 Units Subcutaneous Q4H  . lamoTRIgine  25 mg Per Tube Daily  .  metoprolol tartrate  6.25 mg Per Tube BID  . polyethylene glycol  17 g Per Tube QODAY  . potassium chloride  20 mEq Per Tube TID  . pravastatin  40 mg Per Tube q1800  . pregabalin  100 mg Per Tube BID  . senna  2 tablet Per Tube QHS  . sodium chloride flush  10-40 mL Intracatheter Q12H  . sodium chloride flush  3 mL Intravenous Q12H  . trazodone  300 mg Per Tube QHS  . umeclidinium bromide  1 puff Inhalation Daily  . venlafaxine  187.5 mg Per Tube Daily   And  . venlafaxine  150 mg Per Tube QHS     LOS: 15 days   Cherene Altes, MD [facility] Office  [phone] Pager - Text Page per Amion  If 7PM-7AM, please contact night-coverage per Amion 06/19/2019, 8:33 AM

## 2019-06-19 NOTE — Progress Notes (Signed)
Kathleen Solo, MD was paged regarding the pt's 3 watery large stools.

## 2019-06-19 NOTE — Progress Notes (Signed)
Thereasa Solo, MD was paged regarding the pt's c/o burning in the peri area.

## 2019-06-19 NOTE — Progress Notes (Signed)
RT NOTE:  ABG complete. Results unable to crossover to Epic due to CO2 greater than acceptable range.  Results reported to Dr. Loleta Books. BIPAP recommended by MD. RT explained we are unable to use BIPAP unless patient is in ICU.  Pt now on [place] High flow 15 lpm. RN will titrated down to maintain SpO2 85%  Redraw ABG after 5am.  Ph: 7.29 CO2: >97 PO2: 55

## 2019-06-19 NOTE — Progress Notes (Signed)
Called by nursing re: confusion, lethargy, prolonged desaturation.  ABG ordered, showed pH 7.2, pCO >97, pO2 55.  Patient has severe, symptomatic hypercarbia.  Recommended BiPAP, which RT are unable to apply presently due to aerosolizing risk.  Will attempt high flow [place] and repeat ABG in 3 hours.  Low pO2 is tolerable, consistent with priors.

## 2019-06-19 NOTE — Progress Notes (Signed)
The pt's primary contact was called and provided with a daily update regarding the pt's current status. Any questions or concerns were addressed.

## 2019-06-19 NOTE — Progress Notes (Signed)
Pt lethargic, difficult to arouse and keep awake. Pt sats holding around 82%. Pt at max on heated high flow. MD and RT aware. Will continue to monitor.  Rufina Falco, RN BSN 06/19/2019 6:44 PM

## 2019-06-19 NOTE — Progress Notes (Signed)
RT NOTE:  Critical ABG reported to RN. Results outside reportable range on ISTAT. Ph: 7.31 CO2: >97 PO2: 48

## 2019-06-19 NOTE — Progress Notes (Signed)
Pt transferred to room 1w55 from 1e10. Pt placed on heated high flow. Sats 85-88%. RT aware. Pt arousable to voice and stimulation. Call light within reach. Will continue to monitor.  Rufina Falco, RN BSN 06/19/2019 4:20 PM

## 2019-06-20 ENCOUNTER — Inpatient Hospital Stay (HOSPITAL_COMMUNITY): Payer: Medicare Other

## 2019-06-20 LAB — CBC WITH DIFFERENTIAL/PLATELET
Abs Immature Granulocytes: 0.14 10*3/uL — ABNORMAL HIGH (ref 0.00–0.07)
Basophils Absolute: 0 10*3/uL (ref 0.0–0.1)
Basophils Relative: 0 %
Eosinophils Absolute: 0 10*3/uL (ref 0.0–0.5)
Eosinophils Relative: 0 %
HCT: 33.1 % — ABNORMAL LOW (ref 36.0–46.0)
Hemoglobin: 8.7 g/dL — ABNORMAL LOW (ref 12.0–15.0)
Immature Granulocytes: 2 %
Lymphocytes Relative: 13 %
Lymphs Abs: 1.2 10*3/uL (ref 0.7–4.0)
MCH: 29.8 pg (ref 26.0–34.0)
MCHC: 26.3 g/dL — ABNORMAL LOW (ref 30.0–36.0)
MCV: 113.4 fL — ABNORMAL HIGH (ref 80.0–100.0)
Monocytes Absolute: 0.5 10*3/uL (ref 0.1–1.0)
Monocytes Relative: 6 %
Neutro Abs: 6.9 10*3/uL (ref 1.7–7.7)
Neutrophils Relative %: 79 %
Platelets: 218 10*3/uL (ref 150–400)
RBC: 2.92 MIL/uL — ABNORMAL LOW (ref 3.87–5.11)
RDW: 17.1 % — ABNORMAL HIGH (ref 11.5–15.5)
WBC: 8.7 10*3/uL (ref 4.0–10.5)
nRBC: 0.3 % — ABNORMAL HIGH (ref 0.0–0.2)

## 2019-06-20 LAB — GLUCOSE, CAPILLARY: Glucose-Capillary: 242 mg/dL — ABNORMAL HIGH (ref 70–99)

## 2019-06-20 LAB — COMPREHENSIVE METABOLIC PANEL
ALT: 33 U/L (ref 0–44)
AST: 15 U/L (ref 15–41)
Albumin: 2.9 g/dL — ABNORMAL LOW (ref 3.5–5.0)
Alkaline Phosphatase: 154 U/L — ABNORMAL HIGH (ref 38–126)
Anion gap: 15 (ref 5–15)
BUN: 33 mg/dL — ABNORMAL HIGH (ref 6–20)
CO2: 47 mmol/L — ABNORMAL HIGH (ref 22–32)
Calcium: 10.2 mg/dL (ref 8.9–10.3)
Chloride: 90 mmol/L — ABNORMAL LOW (ref 98–111)
Creatinine, Ser: 0.77 mg/dL (ref 0.44–1.00)
GFR calc Af Amer: >60 mL/min (ref >60)
GFR calc non Af Amer: >60 mL/min (ref >60)
Glucose, Bld: 231 mg/dL — ABNORMAL HIGH (ref 70–99)
Potassium: 3.6 mmol/L (ref 3.5–5.1)
Sodium: 152 mmol/L — ABNORMAL HIGH (ref 135–145)
Total Bilirubin: 0.2 mg/dL — ABNORMAL LOW (ref 0.3–1.2)
Total Protein: 6.8 g/dL (ref 6.5–8.1)

## 2019-06-20 LAB — PROCALCITONIN: Procalcitonin: 0.37 ng/mL

## 2019-06-20 LAB — D-DIMER, QUANTITATIVE: D-Dimer, Quant: 0.84 ug/mL-FEU — ABNORMAL HIGH (ref 0.00–0.50)

## 2019-06-20 LAB — FERRITIN: Ferritin: 303 ng/mL (ref 11–307)

## 2019-06-20 LAB — C-REACTIVE PROTEIN: CRP: 24.4 mg/dL — ABNORMAL HIGH (ref <1.0)

## 2019-07-17 NOTE — Progress Notes (Signed)
Spoke with Larinda Buttery , responsible party this AM and gave her an update on patient;s condition. She is requesting that MD gives her a call this morning. Noted left on board for MD. Patient's oxygen level continues to fluctuate in the 70's and 80's. Blood pressure dropped to the 80's last night and Lopressor was held.She removes her nasal canula and EKG pads. Noted moisture skin damage between gluteal folds. Barrier cream applied.

## 2019-07-17 NOTE — Discharge Summary (Addendum)
   Death Summary   Kathleen Roy XMI:[id number] DOB: [birth year] DOA: 27-Jun-2019  PCP: [name], MD  Admit date: Jun 27, 2019 Date of Death: July 13, 2019  Final Diagnoses:  Acute on chronic hypoxic and hypercapnic respiratory failure Aspiration pneumonia with lung collapse COVID Pneumonia Sepsis, present on admission - due to aspiration pneumonia and COVID-19  Newly diagnosed atrial fibrillation with RVR COPD Acute on chronic diastolic CHF OHS/SA HTN Morbid obesity - Body mass index is 44.35 kg/m. DM2 with transient hypoglycemia Macrocytic anemia  History of present illness:  58yo SNF resident with a hx of COPD, chronic hypoxic respiratory failure, DM2, hypertension, obstructive sleep apnea/OHS, and chronic diastolic CHF who presented to the ED w/ worsening lethargy and increased supplemental oxygen requirement. She was found to be COVID-19 positive, and also suffering from aspiration pneumonia. She developed A. fib with RVR 06/07/2019.  Hospital Course:  The patient was admitted to the acute units on 06-27-2019 after she presented to the emergency room with lethargy and worsening respiratory failure.  Her hospital course was quite protracted.  Her condition was complicated by many overlapping contributory pathologies to include obesity hypoventilation syndrome, acute on chronic diastolic heart failure, recurrent aspiration pneumonia/pneumonitis, and COVID pneumonia.  She was treated early in the hospital course with Remdesivir and steroids for her COVID pneumonia.  She actually appeared to stabilize somewhat in regard to this diagnosis.  Ongoing difficulty however was encountered with her respiratory status due to her diastolic heart failure, obesity hypoventilation syndrome, and recurrent aspiration.  Pulmonary was consulted and agreed that there was little more to add to the multifaceted approach being utilized.  Though she felt bravely throughout her hospital stay she began to worsen  significantly later in the evening of 06/18/2019.  Her hypercarbic respiratory failure was progressing as well as her hypoxic respiratory failure.  She was transition to heated high flow nasal cannula and this was titrated.  Ultimately she was receiving maximum support via heated high flow nasal cannula.  She failed to improve significantly and at 7:43 AM on Jul 13, 2019 the patient died.  Signed:  Cherene Altes  [facility] 07-13-19, 8:46 AM

## 2019-07-17 NOTE — Progress Notes (Signed)
Pt expired 7:43am. MD notified, Family notified via MD, Body Prepared, Belongings (Phone, Charger and glasses) at foot of bed.

## 2019-07-17 DEATH — deceased
